# Patient Record
Sex: Female | Born: 1935
Health system: Southern US, Community
[De-identification: ages and names within clinical notes are randomized; demographics above are authoritative.]

## PROBLEM LIST (undated history)

## (undated) DIAGNOSIS — Z8673 Personal history of transient ischemic attack (TIA), and cerebral infarction without residual deficits: Secondary | ICD-10-CM

## (undated) DIAGNOSIS — J189 Pneumonia, unspecified organism: Secondary | ICD-10-CM

## (undated) DIAGNOSIS — F419 Anxiety disorder, unspecified: Secondary | ICD-10-CM

## (undated) DIAGNOSIS — J449 Chronic obstructive pulmonary disease, unspecified: Secondary | ICD-10-CM

## (undated) DIAGNOSIS — Z8701 Personal history of pneumonia (recurrent): Secondary | ICD-10-CM

## (undated) DIAGNOSIS — M1990 Unspecified osteoarthritis, unspecified site: Secondary | ICD-10-CM

## (undated) DIAGNOSIS — K219 Gastro-esophageal reflux disease without esophagitis: Secondary | ICD-10-CM

## (undated) DIAGNOSIS — S0990XA Unspecified injury of head, initial encounter: Secondary | ICD-10-CM

## (undated) DIAGNOSIS — N3941 Urge incontinence: Secondary | ICD-10-CM

## (undated) DIAGNOSIS — K5903 Drug induced constipation: Secondary | ICD-10-CM

## (undated) DIAGNOSIS — E039 Hypothyroidism, unspecified: Secondary | ICD-10-CM

## (undated) DIAGNOSIS — F32A Depression, unspecified: Secondary | ICD-10-CM

## (undated) DIAGNOSIS — I639 Cerebral infarction, unspecified: Secondary | ICD-10-CM

## (undated) DIAGNOSIS — F329 Major depressive disorder, single episode, unspecified: Secondary | ICD-10-CM

## (undated) DIAGNOSIS — I739 Peripheral vascular disease, unspecified: Secondary | ICD-10-CM

## (undated) DIAGNOSIS — K5790 Diverticulosis of intestine, part unspecified, without perforation or abscess without bleeding: Secondary | ICD-10-CM

## (undated) DIAGNOSIS — M797 Fibromyalgia: Secondary | ICD-10-CM

## (undated) DIAGNOSIS — I10 Essential (primary) hypertension: Secondary | ICD-10-CM

## (undated) DIAGNOSIS — K2970 Gastritis, unspecified, without bleeding: Secondary | ICD-10-CM

## (undated) DIAGNOSIS — R0600 Dyspnea, unspecified: Secondary | ICD-10-CM

## (undated) HISTORY — DX: Peripheral vascular disease, unspecified: I73.9

## (undated) HISTORY — DX: Personal history of transient ischemic attack (TIA), and cerebral infarction without residual deficits: Z86.73

## (undated) HISTORY — PX: BACK SURGERY: SHX140

## (undated) HISTORY — PX: CATARACT EXTRACTION W/ INTRAOCULAR LENS IMPLANT: SHX1309

## (undated) HISTORY — PX: TOTAL KNEE ARTHROPLASTY: SHX125

## (undated) HISTORY — PX: CHOLECYSTECTOMY: SHX55

## (undated) HISTORY — PX: FRACTURE SURGERY: SHX138

## (undated) HISTORY — PX: ROTATOR CUFF REPAIR: SHX139

## (undated) HISTORY — PX: EYE SURGERY: SHX253

## (undated) HISTORY — PX: JOINT REPLACEMENT: SHX530

## (undated) HISTORY — DX: Personal history of pneumonia (recurrent): Z87.01

---

## 1997-06-22 ENCOUNTER — Ambulatory Visit (HOSPITAL_COMMUNITY): Admission: RE | Admit: 1997-06-22 | Discharge: 1997-06-22 | Payer: Self-pay | Admitting: Neurosurgery

## 1997-07-06 ENCOUNTER — Ambulatory Visit (HOSPITAL_COMMUNITY): Admission: RE | Admit: 1997-07-06 | Discharge: 1997-07-06 | Payer: Self-pay | Admitting: Neurosurgery

## 1997-12-29 ENCOUNTER — Ambulatory Visit (HOSPITAL_COMMUNITY): Admission: RE | Admit: 1997-12-29 | Discharge: 1997-12-29 | Payer: Self-pay | Admitting: Obstetrics & Gynecology

## 1999-07-12 ENCOUNTER — Other Ambulatory Visit: Admission: RE | Admit: 1999-07-12 | Discharge: 1999-07-12 | Payer: Self-pay | Admitting: Gynecology

## 2000-05-29 ENCOUNTER — Ambulatory Visit (HOSPITAL_COMMUNITY): Admission: RE | Admit: 2000-05-29 | Discharge: 2000-05-29 | Payer: Self-pay | Admitting: Pulmonary Disease

## 2000-07-25 ENCOUNTER — Ambulatory Visit (HOSPITAL_COMMUNITY): Admission: RE | Admit: 2000-07-25 | Discharge: 2000-07-25 | Payer: Self-pay | Admitting: Obstetrics and Gynecology

## 2000-07-25 ENCOUNTER — Encounter: Payer: Self-pay | Admitting: Obstetrics and Gynecology

## 2000-09-04 ENCOUNTER — Other Ambulatory Visit: Admission: RE | Admit: 2000-09-04 | Discharge: 2000-09-04 | Payer: Self-pay | Admitting: Obstetrics and Gynecology

## 2000-10-16 ENCOUNTER — Encounter: Payer: Self-pay | Admitting: Orthopaedic Surgery

## 2000-10-16 ENCOUNTER — Ambulatory Visit (HOSPITAL_COMMUNITY): Admission: RE | Admit: 2000-10-16 | Discharge: 2000-10-16 | Payer: Self-pay | Admitting: Orthopaedic Surgery

## 2000-12-21 HISTORY — PX: INCONTINENCE SURGERY: SHX676

## 2000-12-21 HISTORY — PX: VAGINAL HYSTERECTOMY: SUR661

## 2000-12-21 HISTORY — PX: ANTERIOR AND POSTERIOR REPAIR: SHX1172

## 2001-01-04 ENCOUNTER — Inpatient Hospital Stay (HOSPITAL_COMMUNITY): Admission: RE | Admit: 2001-01-04 | Discharge: 2001-01-07 | Payer: Self-pay | Admitting: Obstetrics and Gynecology

## 2001-06-28 ENCOUNTER — Ambulatory Visit (HOSPITAL_COMMUNITY): Admission: RE | Admit: 2001-06-28 | Discharge: 2001-06-28 | Payer: Self-pay | Admitting: Orthopaedic Surgery

## 2001-06-28 ENCOUNTER — Encounter: Payer: Self-pay | Admitting: Orthopaedic Surgery

## 2001-08-06 ENCOUNTER — Ambulatory Visit (HOSPITAL_BASED_OUTPATIENT_CLINIC_OR_DEPARTMENT_OTHER): Admission: RE | Admit: 2001-08-06 | Discharge: 2001-08-06 | Payer: Self-pay | Admitting: Orthopaedic Surgery

## 2001-09-03 ENCOUNTER — Encounter (HOSPITAL_COMMUNITY): Admission: RE | Admit: 2001-09-03 | Discharge: 2001-10-03 | Payer: Self-pay | Admitting: Orthopaedic Surgery

## 2001-09-05 ENCOUNTER — Encounter: Payer: Self-pay | Admitting: Orthopaedic Surgery

## 2001-09-05 ENCOUNTER — Ambulatory Visit (HOSPITAL_COMMUNITY): Admission: RE | Admit: 2001-09-05 | Discharge: 2001-09-05 | Payer: Self-pay | Admitting: Orthopaedic Surgery

## 2001-10-03 ENCOUNTER — Encounter (HOSPITAL_COMMUNITY): Admission: RE | Admit: 2001-10-03 | Discharge: 2001-11-02 | Payer: Self-pay | Admitting: Orthopaedic Surgery

## 2001-11-05 ENCOUNTER — Encounter (HOSPITAL_COMMUNITY): Admission: RE | Admit: 2001-11-05 | Discharge: 2001-12-05 | Payer: Self-pay | Admitting: Orthopaedic Surgery

## 2001-11-19 ENCOUNTER — Ambulatory Visit (HOSPITAL_COMMUNITY): Admission: RE | Admit: 2001-11-19 | Discharge: 2001-11-19 | Payer: Self-pay | Admitting: Orthopaedic Surgery

## 2001-11-19 ENCOUNTER — Encounter: Payer: Self-pay | Admitting: Orthopaedic Surgery

## 2001-12-04 ENCOUNTER — Ambulatory Visit (HOSPITAL_COMMUNITY): Admission: RE | Admit: 2001-12-04 | Discharge: 2001-12-04 | Payer: Self-pay | Admitting: Pulmonary Disease

## 2002-10-21 ENCOUNTER — Ambulatory Visit (HOSPITAL_COMMUNITY): Admission: RE | Admit: 2002-10-21 | Discharge: 2002-10-21 | Payer: Self-pay | Admitting: Pulmonary Disease

## 2002-10-28 ENCOUNTER — Ambulatory Visit (HOSPITAL_COMMUNITY): Admission: RE | Admit: 2002-10-28 | Discharge: 2002-10-28 | Payer: Self-pay | Admitting: Pulmonary Disease

## 2002-12-08 ENCOUNTER — Encounter: Payer: Self-pay | Admitting: Obstetrics and Gynecology

## 2002-12-08 ENCOUNTER — Ambulatory Visit (HOSPITAL_COMMUNITY): Admission: RE | Admit: 2002-12-08 | Discharge: 2002-12-08 | Payer: Self-pay | Admitting: Obstetrics and Gynecology

## 2002-12-08 ENCOUNTER — Ambulatory Visit (HOSPITAL_COMMUNITY): Admission: RE | Admit: 2002-12-08 | Discharge: 2002-12-08 | Payer: Self-pay | Admitting: Orthopaedic Surgery

## 2002-12-08 ENCOUNTER — Encounter: Payer: Self-pay | Admitting: Orthopaedic Surgery

## 2003-07-02 ENCOUNTER — Inpatient Hospital Stay (HOSPITAL_COMMUNITY): Admission: RE | Admit: 2003-07-02 | Discharge: 2003-07-07 | Payer: Self-pay | Admitting: Orthopedic Surgery

## 2004-02-02 ENCOUNTER — Ambulatory Visit (HOSPITAL_COMMUNITY): Admission: RE | Admit: 2004-02-02 | Discharge: 2004-02-02 | Payer: Self-pay | Admitting: Pulmonary Disease

## 2004-05-31 ENCOUNTER — Encounter: Admission: RE | Admit: 2004-05-31 | Discharge: 2004-05-31 | Payer: Self-pay | Admitting: Orthopedic Surgery

## 2004-06-29 ENCOUNTER — Ambulatory Visit (HOSPITAL_COMMUNITY): Admission: RE | Admit: 2004-06-29 | Discharge: 2004-06-29 | Payer: Self-pay | Admitting: Orthopedic Surgery

## 2004-06-29 ENCOUNTER — Ambulatory Visit (HOSPITAL_BASED_OUTPATIENT_CLINIC_OR_DEPARTMENT_OTHER): Admission: RE | Admit: 2004-06-29 | Discharge: 2004-06-29 | Payer: Self-pay | Admitting: Orthopedic Surgery

## 2004-09-29 ENCOUNTER — Ambulatory Visit (HOSPITAL_COMMUNITY): Admission: RE | Admit: 2004-09-29 | Discharge: 2004-09-29 | Payer: Self-pay | Admitting: Pulmonary Disease

## 2005-02-06 ENCOUNTER — Ambulatory Visit (HOSPITAL_COMMUNITY): Admission: RE | Admit: 2005-02-06 | Discharge: 2005-02-06 | Payer: Self-pay | Admitting: Obstetrics and Gynecology

## 2005-02-20 DIAGNOSIS — I779 Disorder of arteries and arterioles, unspecified: Secondary | ICD-10-CM

## 2005-02-20 HISTORY — DX: Disorder of arteries and arterioles, unspecified: I77.9

## 2005-02-20 HISTORY — PX: CAROTID ENDARTERECTOMY: SUR193

## 2005-03-23 ENCOUNTER — Ambulatory Visit (HOSPITAL_COMMUNITY): Admission: RE | Admit: 2005-03-23 | Discharge: 2005-03-23 | Payer: Self-pay | Admitting: Pulmonary Disease

## 2005-08-15 ENCOUNTER — Ambulatory Visit: Payer: Self-pay | Admitting: Internal Medicine

## 2005-09-08 ENCOUNTER — Ambulatory Visit: Payer: Self-pay | Admitting: Internal Medicine

## 2005-09-08 ENCOUNTER — Ambulatory Visit (HOSPITAL_COMMUNITY): Admission: RE | Admit: 2005-09-08 | Discharge: 2005-09-08 | Payer: Self-pay | Admitting: Internal Medicine

## 2006-03-01 ENCOUNTER — Ambulatory Visit (HOSPITAL_COMMUNITY): Admission: RE | Admit: 2006-03-01 | Discharge: 2006-03-01 | Payer: Self-pay | Admitting: Obstetrics and Gynecology

## 2006-06-13 ENCOUNTER — Ambulatory Visit (HOSPITAL_COMMUNITY): Admission: RE | Admit: 2006-06-13 | Discharge: 2006-06-13 | Payer: Self-pay | Admitting: Obstetrics and Gynecology

## 2006-08-21 HISTORY — PX: LUMBAR FUSION: SHX111

## 2006-08-23 ENCOUNTER — Inpatient Hospital Stay (HOSPITAL_COMMUNITY): Admission: RE | Admit: 2006-08-23 | Discharge: 2006-08-24 | Payer: Self-pay | Admitting: Orthopedic Surgery

## 2006-10-09 ENCOUNTER — Encounter (HOSPITAL_COMMUNITY): Admission: RE | Admit: 2006-10-09 | Discharge: 2006-11-08 | Payer: Self-pay | Admitting: Orthopedic Surgery

## 2007-02-21 HISTORY — PX: BREAST SURGERY: SHX581

## 2007-03-06 ENCOUNTER — Ambulatory Visit (HOSPITAL_COMMUNITY): Admission: RE | Admit: 2007-03-06 | Discharge: 2007-03-06 | Payer: Self-pay | Admitting: Pulmonary Disease

## 2007-03-06 ENCOUNTER — Ambulatory Visit (HOSPITAL_COMMUNITY): Admission: RE | Admit: 2007-03-06 | Discharge: 2007-03-06 | Payer: Self-pay | Admitting: Obstetrics and Gynecology

## 2007-03-22 ENCOUNTER — Encounter (INDEPENDENT_AMBULATORY_CARE_PROVIDER_SITE_OTHER): Payer: Self-pay | Admitting: General Surgery

## 2007-03-22 ENCOUNTER — Ambulatory Visit (HOSPITAL_COMMUNITY): Admission: RE | Admit: 2007-03-22 | Discharge: 2007-03-22 | Payer: Self-pay | Admitting: General Surgery

## 2007-05-12 ENCOUNTER — Inpatient Hospital Stay (HOSPITAL_COMMUNITY): Admission: EM | Admit: 2007-05-12 | Discharge: 2007-05-14 | Payer: Self-pay | Admitting: Emergency Medicine

## 2007-09-10 ENCOUNTER — Encounter (INDEPENDENT_AMBULATORY_CARE_PROVIDER_SITE_OTHER): Payer: Self-pay | Admitting: Podiatry

## 2007-09-10 ENCOUNTER — Ambulatory Visit (HOSPITAL_COMMUNITY): Admission: RE | Admit: 2007-09-10 | Discharge: 2007-09-10 | Payer: Self-pay | Admitting: Podiatry

## 2007-09-10 HISTORY — PX: BUNIONECTOMY WITH HAMMERTOE RECONSTRUCTION: SHX5600

## 2008-03-13 ENCOUNTER — Ambulatory Visit (HOSPITAL_COMMUNITY): Admission: RE | Admit: 2008-03-13 | Discharge: 2008-03-13 | Payer: Self-pay | Admitting: Pulmonary Disease

## 2008-03-25 ENCOUNTER — Ambulatory Visit (HOSPITAL_COMMUNITY): Admission: RE | Admit: 2008-03-25 | Discharge: 2008-03-25 | Payer: Self-pay | Admitting: Obstetrics and Gynecology

## 2008-04-02 ENCOUNTER — Ambulatory Visit (HOSPITAL_COMMUNITY): Admission: RE | Admit: 2008-04-02 | Discharge: 2008-04-02 | Payer: Self-pay | Admitting: Pulmonary Disease

## 2008-07-03 ENCOUNTER — Ambulatory Visit (HOSPITAL_COMMUNITY): Admission: RE | Admit: 2008-07-03 | Discharge: 2008-07-03 | Payer: Self-pay | Admitting: Obstetrics and Gynecology

## 2008-11-09 ENCOUNTER — Ambulatory Visit (HOSPITAL_COMMUNITY): Admission: RE | Admit: 2008-11-09 | Discharge: 2008-11-09 | Payer: Self-pay | Admitting: Ophthalmology

## 2009-03-26 ENCOUNTER — Ambulatory Visit (HOSPITAL_COMMUNITY): Admission: RE | Admit: 2009-03-26 | Discharge: 2009-03-26 | Payer: Self-pay | Admitting: Obstetrics and Gynecology

## 2009-04-02 ENCOUNTER — Encounter: Admission: RE | Admit: 2009-04-02 | Discharge: 2009-04-02 | Payer: Self-pay | Admitting: Obstetrics and Gynecology

## 2009-08-13 ENCOUNTER — Ambulatory Visit (HOSPITAL_COMMUNITY): Admission: RE | Admit: 2009-08-13 | Discharge: 2009-08-13 | Payer: Self-pay | Admitting: Pulmonary Disease

## 2009-12-01 ENCOUNTER — Inpatient Hospital Stay (HOSPITAL_COMMUNITY): Admission: RE | Admit: 2009-12-01 | Discharge: 2009-12-03 | Payer: Self-pay | Admitting: Orthopedic Surgery

## 2010-01-04 ENCOUNTER — Encounter (HOSPITAL_COMMUNITY)
Admission: RE | Admit: 2010-01-04 | Discharge: 2010-02-03 | Payer: Self-pay | Source: Home / Self Care | Attending: Orthopedic Surgery | Admitting: Orthopedic Surgery

## 2010-03-01 ENCOUNTER — Ambulatory Visit (HOSPITAL_COMMUNITY): Admission: RE | Admit: 2010-03-01 | Payer: Self-pay | Source: Home / Self Care | Admitting: Orthopedic Surgery

## 2010-03-01 ENCOUNTER — Ambulatory Visit (HOSPITAL_COMMUNITY)
Admission: RE | Admit: 2010-03-01 | Discharge: 2010-03-01 | Payer: Self-pay | Source: Home / Self Care | Admitting: Orthopedic Surgery

## 2010-03-11 ENCOUNTER — Other Ambulatory Visit: Payer: Self-pay | Admitting: Obstetrics and Gynecology

## 2010-03-11 DIAGNOSIS — Z1239 Encounter for other screening for malignant neoplasm of breast: Secondary | ICD-10-CM

## 2010-03-13 ENCOUNTER — Encounter: Payer: Self-pay | Admitting: Obstetrics and Gynecology

## 2010-03-13 ENCOUNTER — Encounter: Payer: Self-pay | Admitting: Pulmonary Disease

## 2010-04-04 ENCOUNTER — Ambulatory Visit (HOSPITAL_COMMUNITY)
Admission: RE | Admit: 2010-04-04 | Discharge: 2010-04-04 | Disposition: A | Discharge status: Home / Self Care | Payer: Medicare HMO | Source: Ambulatory Visit | Attending: Obstetrics and Gynecology | Admitting: Obstetrics and Gynecology

## 2010-04-04 DIAGNOSIS — Z1231 Encounter for screening mammogram for malignant neoplasm of breast: Secondary | ICD-10-CM | POA: Insufficient documentation

## 2010-04-04 DIAGNOSIS — Z1239 Encounter for other screening for malignant neoplasm of breast: Secondary | ICD-10-CM

## 2010-05-04 LAB — BASIC METABOLIC PANEL
CO2: 29 mEq/L (ref 19–32)
CO2: 30 mEq/L (ref 19–32)
Chloride: 102 mEq/L (ref 96–112)
Chloride: 97 mEq/L (ref 96–112)
Creatinine, Ser: 0.57 mg/dL (ref 0.4–1.2)
GFR calc Af Amer: >60 mL/min (ref >60)
Glucose, Bld: 118 mg/dL — ABNORMAL HIGH (ref 70–99)
Potassium: 4.1 mEq/L (ref 3.5–5.1)
Sodium: 135 mEq/L (ref 135–145)
Sodium: 135 mEq/L (ref 135–145)

## 2010-05-04 LAB — URINE MICROSCOPIC-ADD ON

## 2010-05-04 LAB — COMPREHENSIVE METABOLIC PANEL
ALT: 16 U/L (ref 0–35)
Alkaline Phosphatase: 77 U/L (ref 39–117)
BUN: 10 mg/dL (ref 6–23)
CO2: 30 mEq/L (ref 19–32)
GFR calc non Af Amer: >60 mL/min (ref >60)
Glucose, Bld: 113 mg/dL — ABNORMAL HIGH (ref 70–99)
Potassium: 4.4 mEq/L (ref 3.5–5.1)
Sodium: 139 mEq/L (ref 135–145)

## 2010-05-04 LAB — CBC
HCT: 28.3 % — ABNORMAL LOW (ref 36.0–46.0)
HCT: 43.9 % (ref 36.0–46.0)
Hemoglobin: 10.1 g/dL — ABNORMAL LOW (ref 12.0–15.0)
Hemoglobin: 15.2 g/dL — ABNORMAL HIGH (ref 12.0–15.0)
Hemoglobin: 9.4 g/dL — ABNORMAL LOW (ref 12.0–15.0)
MCH: 30.6 pg (ref 26.0–34.0)
MCH: 31.1 pg (ref 26.0–34.0)
MCHC: 33.2 g/dL (ref 30.0–36.0)
MCHC: 34.6 g/dL (ref 30.0–36.0)
MCV: 92.8 fL (ref 78.0–100.0)
MCV: 93 fL (ref 78.0–100.0)
RBC: 3.3 MIL/uL — ABNORMAL LOW (ref 3.87–5.11)
WBC: 6.5 10*3/uL (ref 4.0–10.5)

## 2010-05-04 LAB — URINALYSIS, ROUTINE W REFLEX MICROSCOPIC
Bilirubin Urine: NEGATIVE
Bilirubin Urine: NEGATIVE
Glucose, UA: NEGATIVE mg/dL
Glucose, UA: NEGATIVE mg/dL
Hgb urine dipstick: NEGATIVE
Hgb urine dipstick: NEGATIVE
Protein, ur: NEGATIVE mg/dL
Specific Gravity, Urine: 1.016 (ref 1.005–1.030)
pH: 7.5 (ref 5.0–8.0)

## 2010-05-04 LAB — URINE CULTURE: Colony Count: >=100000

## 2010-05-04 LAB — TYPE AND SCREEN
ABO/RH(D): O POS
Antibody Screen: NEGATIVE

## 2010-05-04 LAB — PROTIME-INR: Prothrombin Time: 12.6 seconds (ref 11.6–15.2)

## 2010-05-24 ENCOUNTER — Encounter (HOSPITAL_BASED_OUTPATIENT_CLINIC_OR_DEPARTMENT_OTHER)
Admission: RE | Admit: 2010-05-24 | Discharge: 2010-05-24 | Disposition: A | Discharge status: Home / Self Care | Payer: Medicare HMO | Source: Ambulatory Visit | Attending: Orthopedic Surgery | Admitting: Orthopedic Surgery

## 2010-05-24 LAB — BASIC METABOLIC PANEL
BUN: 13 mg/dL (ref 6–23)
Chloride: 99 mEq/L (ref 96–112)
Glucose, Bld: 109 mg/dL — ABNORMAL HIGH (ref 70–99)
Potassium: 4.6 mEq/L (ref 3.5–5.1)

## 2010-05-26 ENCOUNTER — Ambulatory Visit (HOSPITAL_BASED_OUTPATIENT_CLINIC_OR_DEPARTMENT_OTHER)
Admission: RE | Admit: 2010-05-26 | Discharge: 2010-05-26 | Disposition: A | Discharge status: Home / Self Care | Payer: Medicare HMO | Source: Ambulatory Visit | Attending: Orthopedic Surgery | Admitting: Orthopedic Surgery

## 2010-05-26 DIAGNOSIS — M67919 Unspecified disorder of synovium and tendon, unspecified shoulder: Secondary | ICD-10-CM | POA: Insufficient documentation

## 2010-05-26 DIAGNOSIS — M66329 Spontaneous rupture of flexor tendons, unspecified upper arm: Secondary | ICD-10-CM | POA: Insufficient documentation

## 2010-05-26 DIAGNOSIS — M719 Bursopathy, unspecified: Secondary | ICD-10-CM | POA: Insufficient documentation

## 2010-05-26 DIAGNOSIS — M19019 Primary osteoarthritis, unspecified shoulder: Secondary | ICD-10-CM | POA: Insufficient documentation

## 2010-05-26 DIAGNOSIS — Z01812 Encounter for preprocedural laboratory examination: Secondary | ICD-10-CM | POA: Insufficient documentation

## 2010-05-26 DIAGNOSIS — M24119 Other articular cartilage disorders, unspecified shoulder: Secondary | ICD-10-CM | POA: Insufficient documentation

## 2010-05-26 LAB — POCT HEMOGLOBIN-HEMACUE: Hemoglobin: 14.6 g/dL (ref 12.0–15.0)

## 2010-05-27 LAB — HEMOGLOBIN AND HEMATOCRIT, BLOOD
HCT: 38 % (ref 36.0–46.0)
Hemoglobin: 13.2 g/dL (ref 12.0–15.0)

## 2010-05-27 LAB — BASIC METABOLIC PANEL
BUN: 14 mg/dL (ref 6–23)
Chloride: 105 mEq/L (ref 96–112)
Glucose, Bld: 145 mg/dL — ABNORMAL HIGH (ref 70–99)
Potassium: 4.4 mEq/L (ref 3.5–5.1)
Sodium: 140 mEq/L (ref 135–145)

## 2010-06-14 ENCOUNTER — Ambulatory Visit (HOSPITAL_COMMUNITY)
Admission: RE | Admit: 2010-06-14 | Discharge: 2010-06-14 | Disposition: A | Discharge status: Home / Self Care | Payer: Medicare HMO | Source: Ambulatory Visit | Attending: Specialist | Admitting: Specialist

## 2010-06-14 DIAGNOSIS — M6281 Muscle weakness (generalized): Secondary | ICD-10-CM | POA: Insufficient documentation

## 2010-06-14 DIAGNOSIS — M25569 Pain in unspecified knee: Secondary | ICD-10-CM | POA: Insufficient documentation

## 2010-06-14 DIAGNOSIS — IMO0001 Reserved for inherently not codable concepts without codable children: Secondary | ICD-10-CM | POA: Insufficient documentation

## 2010-06-14 DIAGNOSIS — M25659 Stiffness of unspecified hip, not elsewhere classified: Secondary | ICD-10-CM | POA: Insufficient documentation

## 2010-06-14 DIAGNOSIS — R262 Difficulty in walking, not elsewhere classified: Secondary | ICD-10-CM | POA: Insufficient documentation

## 2010-06-17 ENCOUNTER — Ambulatory Visit (HOSPITAL_COMMUNITY)
Admission: RE | Admit: 2010-06-17 | Discharge: 2010-06-17 | Disposition: A | Discharge status: Home / Self Care | Payer: Medicare HMO | Source: Ambulatory Visit | Attending: Pulmonary Disease | Admitting: Pulmonary Disease

## 2010-06-21 ENCOUNTER — Ambulatory Visit (HOSPITAL_COMMUNITY)
Admission: RE | Admit: 2010-06-21 | Discharge: 2010-06-21 | Disposition: A | Discharge status: Home / Self Care | Payer: Medicare HMO | Source: Ambulatory Visit | Attending: Pulmonary Disease | Admitting: Pulmonary Disease

## 2010-06-23 ENCOUNTER — Ambulatory Visit (HOSPITAL_COMMUNITY)
Admission: RE | Admit: 2010-06-23 | Discharge: 2010-06-23 | Disposition: A | Discharge status: Home / Self Care | Payer: Medicare HMO | Source: Ambulatory Visit | Attending: Pulmonary Disease | Admitting: Pulmonary Disease

## 2010-06-28 NOTE — Op Note (Signed)
  [name], Wendy             ACCOUNT NO.:  [id number]  MEDICAL RECORD NO.:  [id number]          PATIENT TYPE:  LOCATION:                                 FACILITY:  PHYSICIAN:  Loreta Ave, M.D. DATE OF BIRTH:  [birth year]  DATE OF PROCEDURE:  05/26/2010 DATE OF DISCHARGE:                              OPERATIVE REPORT   PREOPERATIVE DIAGNOSES:  Right shoulder recurrent chronic retracted probably irreparable rotator cuff tear.  Significant degenerative arthritis right shoulder.  POSTOPERATIVE DIAGNOSES:  Right shoulder recurrent chronic retracted probably irreparable rotator cuff tear.  Significant degenerative arthritis right shoulder with grade 4 changes throughout the shoulder. Irreparable retracted tear of supra and infraspinatus tendon.  Marked tearing intra-articular portion long head biceps tendon.  Extensive degenerative tearing of the labrum.  Adequate distal clavicle excision, but some recurrent subacromial spurring.  PROCEDURES:  Right shoulder exam under anesthesia, arthroscopy. Debridement of rotator cuff and labrum.  Released resection of biceps tendon.  Bursectomy and revision acromioplasty.  SURGEON:  Loreta Ave, MD  ASSISTANT:  Zonia Kief, PA present throughout the entire case and necessary for timely completion of procedure.  ANESTHESIA:  General.  ESTIMATED BLOOD LOSS:  Minimal.  SPECIMENS:  None.  CULTURES:  None.  COMPLICATIONS:  None.  DRESSINGS:  Soft compressive sling.  PROCEDURE:  The patient was brought to the operating room, placed on the operating table in a supine position.  After adequate anesthesia had been obtained, shoulder examined.  Good passive motion and stability. High-riding humeral head.  Placed in beach-chair position on the shoulder positioner, prepped and draped in usual sterile fashion.  Two portals, one posterior, one posterolateral.  Arthroscope introduced, shoulder distended and inspected.  Grade 4  changes throughout the entire glenohumeral joint.  Chondral debris, loose bodies removed.  Extensive circumferential tearing labrum debrided.  About 50% of biceps left, but marked tearing of the entire intra-articular portion.  This was released and resected.  After completely clearing out all the debris and the shoulder cuff was examined.  This was a chronic retracted tear medial to the glenoid.  Nothing reparable at all, not mobile at all.  Debrided back to a stable surface.  Looking above, she had already had acromioplasty, but there was some recurrent spurring in the middle and at the front.  Conversion to a nice flat acromion, preserving as much as possible because of the high-riding humeral head.  The CA ligament was not re-released and I did not have to remove any further clavicle.  At completion, the entire shoulder examined to be sure of everything being cleared out.  The cuff was looked up from all angles and repair was not an option at all.  Instruments and fluid removed.  Portals closed with nylon.  Sterile compressive dressing applied.  Sling applied. Anesthesia reversed.  Brought to the recovery room.  Tolerated surgery well.  No complications.     Loreta Ave, M.D.     DFM/MEDQ  D:  05/26/2010  T:  05/27/2010  Job:  [id number]  Electronically Signed by Mckinley Jewel M.D. on 06/28/2010 01:28:31 PM

## 2010-06-30 ENCOUNTER — Ambulatory Visit (HOSPITAL_COMMUNITY)
Admission: RE | Admit: 2010-06-30 | Discharge: 2010-06-30 | Disposition: A | Discharge status: Home / Self Care | Payer: Medicare HMO | Source: Ambulatory Visit | Attending: Pulmonary Disease | Admitting: Pulmonary Disease

## 2010-07-01 ENCOUNTER — Ambulatory Visit (HOSPITAL_COMMUNITY)
Admission: RE | Admit: 2010-07-01 | Discharge: 2010-07-01 | Disposition: A | Discharge status: Home / Self Care | Payer: Medicare HMO | Source: Ambulatory Visit | Attending: Pulmonary Disease | Admitting: Pulmonary Disease

## 2010-07-05 ENCOUNTER — Ambulatory Visit (HOSPITAL_COMMUNITY)
Admission: RE | Admit: 2010-07-05 | Discharge: 2010-07-05 | Disposition: A | Discharge status: Home / Self Care | Payer: Medicare HMO | Source: Ambulatory Visit | Attending: Pulmonary Disease | Admitting: Pulmonary Disease

## 2010-07-05 NOTE — H&P (Signed)
[name], [name]             ACCOUNT NO.:  [id number]   MEDICAL RECORD NO.:  [id number]          PATIENT TYPE:  INP   LOCATION:  A318                          FACILITY:  [facility]   PHYSICIAN:  Mila Homer. Sudie Bailey, M.D.DATE OF BIRTH:  October 13, 1935   DATE OF ADMISSION:  05/12/2007  DATE OF DISCHARGE:  LH                              HISTORY & PHYSICAL   ADDENDUM:  Last diagnosis is abnormal blood test.  Her MCV was low and  she has been on iron.  For this reason, I am checking a ferritin level  and iron level on her tomorrow and we will Hemoccult her stool.      Mila Homer. Sudie Bailey, M.D.  Electronically Signed     SDK/MEDQ  D:  05/12/2007  T:  05/12/2007  Job:  [id number]

## 2010-07-05 NOTE — Op Note (Signed)
[name], [name]             ACCOUNT NO.:  [id number]   MEDICAL RECORD NO.:  [id number]          PATIENT TYPE:  OIB   LOCATION:  1608                         FACILITY:  [facility]   PHYSICIAN:  Alvy Beal, MD    DATE OF BIRTH:  Sep 12, 1935   DATE OF PROCEDURE:  08/22/2006  DATE OF DISCHARGE:                               OPERATIVE REPORT   PREOPERATIVE DIAGNOSIS:  Severe lumbar spinal stenosis L2 to L5.   POSTOPERATIVE DIAGNOSIS:  Severe lumbar spinal stenosis L2 to L5.   OPERATION PERFORMED:  Lumbar spinal decompression L2 to L5 with  foraminotomy and in situ arthrodesis using regional autograft bone and  synthetic Actifuse bone graft extender.   SURGEON:  Alvy Beal, MD   ANESTHESIA:  General.   COMPLICATIONS:  None.   CONDITION:  Stable.   INDICATIONS FOR PROCEDURE:  [name] is a very pleasant 75 year old with  longstanding severe low back pain which has become acutely worse over  the last two to three years.  She also began having symptoms consistent  with neurogenic claudication.  After attempt at conservative management  consisting of physio and injection therapy, medical management, braces,  had failed to alleviate any of her symptoms.  She elected to proceed  with surgery.  All appropriate risks, benefits and alternatives were  explained to the patient and consent was obtained.   DESCRIPTION OF PROCEDURE:  The patient was brought to the operating  room, placed supine on the operating table.  After successful induction  of general anesthesia and endotracheal intubation, TEDs, SCD and a Foley  catheter were applied.  The patient was turned prone onto the Wilson  frame.  All bony prominences were well padded and the spine was prepped  and draped in standard fashion.   Incision was then made starting at the inferior aspect of the L5 and  proceeding superiorly to the superior aspect of L2.  Sharp dissection  was carried out down to the deep fascia.  The deep  fascia was sharply  incised and I then stripped the paraspinal muscles off the spinous  processes of what I believed to be L2, L3, L4 and L5.  This was done  bilaterally.  I then exposed out over the lateral aspect of the facet  joint to expose the L2, 3, 4 transverse processes.   With the posterolateral decompression completed, with the posterolateral  approach completed being able to expose the spine, I then placed a  Penfield 4 under what I believed to be the lamina of L2 and L4.  Intraoperative x-ray indicated that I was at L3 and L1 and so I  readjusted the incision.  I then repeated the x-ray and confirmed the L2  and L4 levels.  At this point I then used the double action Leksell  rongeur to resect the spinous processes of L2, L3 and L4 in their  entirety.   At this point I then proceeded with the decompression.  Using a Leksell  rongeur I performed a partial laminectomy of L2 and then used a fine  Carlens curette to develop a plane between  the ligamentum flavum and the  lamina.  Then using a combination of 2 and 3 mm Kerrison rongeurs, I  performed a generous laminectomy of L2.  With the central decompression  complete, I was able to develop a plane underneath the ligamentum  flavum.  I then carefully resected the ligamentum flavum at the L2 level  and directly exposed the dura.  There was significant compression of the  dura noted at this level.  I then took a great deal of time resecting  the thickened bulk of ligamentum flavum in order to complete the central  decompression at L2.  I then passed a small neural patty underneath the  superior aspect of the lamina of L3 in order to protect the dura.  I  then used a combination of 2 and 3 mm Kerrisons to do a complete  laminectomy of L3.  I then removed again the severely thickened  ligamentum flavum and then again did a central decompression at L4.  At  this point with the central decompression completed, I then went out the   lateral gutter.  I resected the ligamentum flavum and bone spurs out to  the medial wall of the pedicle.  I was able to visualize the L2, L3 and  L4 pedicles and had completely decompressed the lateral recess.  I then  used a 3 mm Kerrison to pass out the neural foramen at L2, L3 and L4 and  performed a generous foraminotomy.  At this point I was able to freely  pass a [facility] out the neural foramen and along the lateral  recess on the left side without any neural compression.   I then went to the opposite side and began a similar lateral recess and  decompression and foraminotomy.  There was a significantly large bone  spur at L3-4 with ligamentum flavum directly adherent to the underlying  dura.  I took a great deal of time using a Penfield 4 and microcurettes  in order to separate the adherent ligamentum flavum from the dura.  I  then resected this very large osteophyte and again visualized the medial  wall of the L2, 3, and 4 pedicles.  I then carried my lateral recess  decompression out to that medial wall.  I then did generous  foraminotomies at L2, L3 and L4.  I was again able to freely pass the  [facility] elevator on the lateral recess and out the neural foramen  without difficulty.  At this point with the thecal sac now expanded and  adequately decompressed as well as the nerve roots decompressed, I then  decorticated the transverse processes at L2, 3 and 4 and packed the  regional autograft bone that I had harvested mixed it with Actifuse  synthetic bone graft extender.  A deep drain was placed.  I obtained  hemostasis using bipolar electrocautery and Floseal.  Thrombin gel  soaked patty was then placed over the decompression site and I closed  the deep fascia with interrupted #1 Vicryl sutures, superficial with 2-0  Vicryl sutures and staples for the skin.  A dry dressing was applied.  The patient was then extubated and transferred to the PACU without  incident.  At the  end of the case all needle and sponge counts were  correct.      Alvy Beal, MD  Electronically Signed     DDB/MEDQ  D:  08/22/2006  T:  08/23/2006  Job:  [id number]

## 2010-07-05 NOTE — Group Therapy Note (Signed)
NAMEGRACY, EHLY             ACCOUNT NO.:  [id number]   MEDICAL RECORD NO.:  [id number]          PATIENT TYPE:  INP   LOCATION:  A318                          FACILITY:  [facility]   PHYSICIAN:  Edward L. Juanetta Gosling, M.D.DATE OF BIRTH:  [birth year]   DATE OF PROCEDURE:  05/14/2007  DATE OF DISCHARGE:                                 PROGRESS NOTE   Ms. Owensby says she feels better and wants to go home.  She has no new  complaints.  Her daughter-in-law is concerned about her work of  breathing and we have discussed that at length.  I have told Ms. Flett  that she could certainly stay another day but she is adamant that she  really wants to go home.   PHYSICAL EXAMINATION:  She looks pretty comfortable.  Temperature is  97.6, pulse 102, respirations 20, blood pressure 145/87, O2 sats 94% on  3 liters.  Her chest is clear.  She has somewhat decreased breath sounds  but no wheezing at all now.  Her heart is regular with tachycardia.  Her  iron level is somewhat low.   ASSESSMENT:  I think she is better from her chronic obstructive  pulmonary disease standpoint.   PLAN:  Discharge home.  Please see discharge summary for details.      Edward L. Juanetta Gosling, M.D.  Electronically Signed     ELH/MEDQ  D:  05/14/2007  T:  05/14/2007  Job:  [id number]

## 2010-07-05 NOTE — Discharge Summary (Signed)
[name], [name]             ACCOUNT NO.:  [id number]   MEDICAL RECORD NO.:  [id number]          PATIENT TYPE:  OIB   LOCATION:  1608                         FACILITY:  [facility]   PHYSICIAN:  Alvy Beal, MD    DATE OF BIRTH:  24-Aug-1935   DATE OF ADMISSION:  08/22/2006  DATE OF DISCHARGE:  08/24/2006                               DISCHARGE SUMMARY   ADMITTING DIAGNOSIS:  Lumbar spinal stenosis.   DISCHARGE DIAGNOSIS:  Lumbar spinal stenosis.   OPERATIVE PROCEDURE:  On August 22, 2006, was a lumbar spinal decompression  and uninstrumented arthrodesis, L2 to L5.   COMPLICATIONS:  None.   CONDITION:  Stable.   HISTORY:  This is a very pleasant 75 year old woman, who has had  longstanding low back pain.  Attempts at conservative management were  tried unsuccessfully.  Since her pain has become progressively worse and  we have been managing it nonoperatively is not successful, she elected  to proceed with surgery.  As I indicated, the patient has spinal  stenosis with neurogenic claudication.  As a result, she is taken to the  operating room on August 22, 2006, for a multilevel lumbar decompression  and arthrodesis.  The patient tolerated this procedure well.  There was  no significant adverse intraoperative occurrences.   Postoperative day 1, I evaluated her.  She was doing quite well.  Her  neurogenic claudication pain had resolved.  Her Foley was removed;  however, it was noted to be cloudy, and so a UA was ordered.  She had  many bacteria, and so she was started on Bactrim double strength for  further treatment of the UTI.  Her pain was well controlled with oral  medication, and she was tolerating a regular diet and voiding  spontaneously.  Prior to discharge, she will be seen and evaluated by  physiotherapy for home clearance, and she will be discharged in the  morning of August 24, 2006, to her family.  She will have a followup  appointment with me on September 04, 2006, at 9:30  a.m. to evaluate the  wound.  She knows to contact my office if she develops any significant  drainage, fevers, chills, weakness, or severe pain.  I expect her  postoperative course to be well.  I will encourage her to walk as much  as possible.  She knows she can sponge bathe, but she cannot shower  because there are staples in the wound.   DISCHARGE MEDICATIONS:  Crestor, diltiazem XR, levothyroxine, aspirin,  Nexium, vitamin C, calcium, Bactrim double strength for a total of 3  days, and Vicodin for pain control.   At this point in time, the patient was stable.     Alvy Beal, MD  Electronically Signed    DDB/MEDQ  D:  08/23/2006  T:  08/23/2006  Job:  [id number]

## 2010-07-05 NOTE — Discharge Summary (Signed)
[name], [name]             ACCOUNT NO.:  [id number]   MEDICAL RECORD NO.:  [id number]          PATIENT TYPE:  INP   LOCATION:  A318                          FACILITY:  [facility]   PHYSICIAN:  Edward L. Juanetta Gosling, M.D.DATE OF BIRTH:  Aug 27, 1935   DATE OF ADMISSION:  05/12/2007  DATE OF DISCHARGE:  LH                               DISCHARGE SUMMARY   FINAL DISCHARGE DIAGNOSES:  1. Chronic obstructive pulmonary disease exacerbation.  2. Hyperlipidemia.  3. Osteoarthritis.  4. Hypertension.  5. Hyperlipidemia.  6. Hypothyroidism.  7. Gastroesophageal reflux disease.  8. Spinal stenosis with neurogenic claudication.  9. Pelvic relaxation.  10.History of total knee replacement.  11.History of spinal decompression.  12.Status post hysterectomy.  13.Previous pelvic floor surgeries.  14.Anemia of chronic disease.   HISTORY:  [name] is a 75 year old who has a history of COPD and who  had been in her usual state of fair health at home when she went to see  a friend in a nursing home.  The friend had been febrile and then after  a day or two, [name] started having cough and congestion.  She came  to the emergency room where she was found to have what appeared to be a  COPD exacerbation.   PHYSICAL EXAMINATION:  Her exam showed that  GENERAL APPEARANCE:  She was in no acute distress, she was thin but well-  developed, she had some wheezing.  She was alert and oriented.  VITAL SIGNS:  Temperature was 97.9, blood pressure 160/70, pulse 101,  respirations 24, O2 sat was 92% on room air.  CHEST:  Decreased breath sounds and some wheezes.  HEART:  Regular with a rate of about 80.  EXTREMITIES:  She had no edema.   Hemoglobin level was 10.8.   HOSPITAL COURSE:  She was started on steroids, inhaled bronchodilators,  intravenous antibiotics, etc., and improved.  By the time of discharge,  she was much better, had no more wheezing.  She did not appear to be as  short of breath and  said that she felt like she was back pretty much to  baseline..  I am going to discharge her home.   DISCHARGE MEDICATIONS:  To continue  1. Crestor 5 mg daily.  2. Dolobid 5 mg daily as needed for pain.  3. Diltiazem extended release 120 mg daily.  4. Levofloxacin 88 mcg daily.  5. Aspirin 325 mg daily.  6. Nexium 20 mg daily.  7. Multivitamin daily.  8. Flaxseed oil daily.  9. Calcium, magnesium, vitamins A and D and folic acid, vitamin B, B12      and selenium all of which are over-the-counter that she is going to      continue.  10.Advil P.M. 2 tablets nightly.  11.She is also going to add some iron and see if it makes any      difference.  Her iron level was low but stool was negative for      blood.  12.She is going to be on prednisone 40 mg x 3 days, 30 x3 days, 20 x3  days, 10 x3 days and then stop.  13.She is going to be on Ceftin 500 mg b.i.d.  14.She is going to be on a nebulizer with albuterol and Atrovent at      home and use that about four times a day.  We are going to measure      an O2 sat today.  She may need oxygen short-term at home.   I offered home health services but her daughter who works in home health  lives next door and is out of work right now so she is going to be  checking on her mother-in-law.      Edward L. Juanetta Gosling, M.D.  Electronically Signed     ELH/MEDQ  D:  05/14/2007  T:  05/14/2007  Job:  [id number]

## 2010-07-05 NOTE — Op Note (Signed)
[name], [name]             ACCOUNT NO.:  [id number]   MEDICAL RECORD NO.:  [id number]          PATIENT TYPE:  AMB   LOCATION:  DAY                           FACILITY:  [facility]   PHYSICIAN:  Dalia Heading, M.D.  DATE OF BIRTH:  10-27-1935   DATE OF PROCEDURE:  03/22/2007  DATE OF DISCHARGE:                               OPERATIVE REPORT   PREOPERATIVE DIAGNOSIS:  Right breast neoplasm, unspecified.   POSTOPERATIVE DIAGNOSIS:  Right breast neoplasm, unspecified.   PROCEDURE:  Right breast biopsy.   SURGEON:  Dalia Heading, M.D.   ANESTHESIA:  MAC.   INDICATIONS:  The patient is a 75 year old of white female who presents  with a dominant mass in the upper, outer quadrant of the right breast.  Preoperative mammography is negative.  The risks and benefits of the  procedure including bleeding and infection were fully explained to the  patient, who gave informed consent.   PROCEDURE NOTE:  The patient was placed in the supine position.  After  anesthesia was administered, the right breast was prepped and draped  using the usual sterile technique with Betadine.  Surgical site  confirmation was performed.  Then 1% Xylocaine was used local  anesthesia.   A curvilinear incision was made in the upper, outer quadrant of the mass  which was localized by palpation preoperatively.  It was indistinct in  nature.  On dissection, it appeared to be lobulated breast tissue within  fibrotic tissue.  There was no gross evidence of malignancy.  The  specimen was then removed, and sent to the pathologist for further  examination.  Any bleeding was controlled using Bovie electrocautery.  The skin was reapproximated using a 4-0 Vicryl subcuticular suture.  Dermabond was then applied.   All tape and needle counts were correct at the end the procedure.  The  patient was awakened and transferred to PACU in stable condition.   COMPLICATIONS:  None.   SPECIMEN:  Right breast tissue.   BLOOD  LOSS:  Minimal.      Dalia Heading, M.D.  Electronically Signed     MAJ/MEDQ  D:  03/22/2007  T:  03/22/2007  Job:  [id number]   cc:   Tilda Burrow, M.D.  Fax: [phone]   Oneal Deputy. Juanetta Gosling, M.D.  Fax: [phone]

## 2010-07-05 NOTE — Op Note (Signed)
NAMEADIN, LARICCIA             ACCOUNT NO.:  [id number]   MEDICAL RECORD NO.:  [id number]          PATIENT TYPE:  AMB   LOCATION:  DAY                           FACILITY:  [facility]   PHYSICIAN:  Denny Peon. Ulice Brilliant, D.P.M.  DATE OF BIRTH:  [birth year]   DATE OF PROCEDURE:  09/10/2007  DATE OF DISCHARGE:  09/10/2007                               OPERATIVE REPORT   PREOPERATIVE DIAGNOSES:  1. Hallux abductovalgus deformity, left foot.  2. Hammer digit, second toe, left foot.   POSTOPERATIVE DIAGNOSES:  1. Hallux abductovalgus deformity, left foot.  2. Hammer digit, second toe, left foot.   PROCEDURES PERFORMED:  1. Keller bunionectomy, left foot.  2. Proximal interphalangeal joint arthroplasty with 0.045 K-wire      fixation, second digit, left foot.   SURGEON:  Denny Peon. Ulice Brilliant, D.P.M.   ANESTHESIA:  Monitored anesthesia care.   INDICATIONS FOR PROCEDURE:  Painful longstanding hammer digit and bunion  deformity of the left foot.  Hammer digit has recurrently gotten  inflamed, in fact one point looked to be infected.  Preoperatively, a  bone scan was read as negative.  She was treated empirically with local  care and subsided.   OPERATIVE FINDINGS:  The patient does have a slight subdermal abscess  over the proximal interphalangeal joint of the second digit of the left  foot at the time of surgery.  The abscess under the skin was cultured.  This does deem the second procedure as being a dirty/infected wound  classification.   DESCRIPTION OF PROCEDURE:  Ms. Spiegelman was brought in the OR and placed  on the table in the supine position.  IV sedation was established.  Prior to receiving sedation, she was given 1 g of Ancef IV  preoperatively.  Following the sedation, a Mayo block was performed  about the first MTP of her left foot.  Further local anesthesia was  administered about the second digit.  A pneumatic ankle tourniquet was  applied across her left ankle.  Her foot was then  prepped and draped in  the usual aseptic fashion.  An Ace bandage utilized to exsanguinate her  foot.  The tourniquet inflated to 250 mmHg.   PROCEDURE:  1. Keller bunionectomy, left foot:  Attention was directed first to      the first MTP this left foot.  A 7-cm slightly curvilinear skin      incision was made over the first MTP.  The incision was deepened      via sharp and blunt dissection through subcutaneous tissues with      care taken to identify, undermine, and retract the neurovascular      bundles about the dorsal and medial aspect of the first MTP.  A      dorsolinear capsulotomy was then performed.  The capsule and      periosteal tissues were reflected away from the base both medially      and laterally of the proximal phalanx as well as the dorsal and      medial aspect of the first metatarsal head.  With good exposure  achieved at both the base and the medial aspect of the first      metatarsal head, attention was directed first to the base of the      proximal phalanx.  A #62 blade on an oscillating saw was utilized      to completely score through and through the base of the proximal      phalanx.  This segment of bone was then removed utilizing sharp and      blunt dissection through these capsular and periosteal structures.      This was removed completely.  The head of the first metatarsal was      not appreciated and the medial eminence was then removed utilizing      the oscillating saw.  The newly formed ostial surface was then      rasped smooth.  Care was taken that there was no disruption of the      capsular tissues during this.  The capsular tissues were then purse-      strung between the base of the proximal phalanx and the head of the      first metatarsal with the toe held rectus utilizing 2-0 Vicryl.      Further capsular closure was then performed about the head of the      first metatarsal utilizing 3-0 Vicryl.  Subcutaneous tissues were      then  reapproximated and closed utilizing 4-0 Vicryl.  Skin was      closed with 4-0 Vicryl in a running subcuticular suture.  Steri-      Strips were then applied across the proximal aspect of this      incision.  2. Proximal interphalangeal joint arthroplasty, second digit, left      foot:  Attention was directed to the second toe.  A dorsolinear      skin incision was made from the mid shaft of the proximal phalanx      crossing over the proximal interphalangeal joint and then extending      back through the base of the proximal phalanx and curving in a      curvilinear fashion over the second MTP.  The incision was deepened      via sharp and blunt dissection.  Purulent drainage, a very small      amount was noted right at the proximal interphalangeal joint above      the extensor tendon.  This was promptly cultured and then flushed      aggressively and suctioned.  There did not appear to be any      penetration of this infectious process through the extensor tendon      and certainly not down into bony prominence.  Care was taken to get      good exposure of the head of the proximal phalanx, the extensor      hood apparatus, the long extensor tendon over the MTP, and the      second MTP itself.  The extensor tendon over the MTP was sharply      incised and undermined revealing the MTP.  A transverse capsulotomy      was then performed across the MTP and an #11 McGlamry elevator was      introduced and utilized to free up the flexor plate.  An extensor      hood release was then performed with paralleling cuts along the      extensor tendon from the head of the proximal  phalanx to the base.      The extensor tendon over the proximal interphalangeal joint was      then entered by a dorsal transverse incision.  The medial and      lateral collateral ligaments isolated and severed.  The extensor      tendons retracted proximally.  The head of the proximal phalanx was      delivered into the  surgical wound and was excised with the #62      blade and the oscillating saw.  A 0.045 K-wire was then introduced      and driven through the base of the intermediate phalanx through the      distal aspect of the toe.  The K-wire was then retrograded back      across the distal shaft of the proximal phalanx and then with the      toe held rectus driven across the MTP.  The toe was noted to be      rectus in all three body planes at this point.  The wound was      flushed with copious amounts of sterile irrigant, redundant      extensor tendon was excised.  Subcutaneous tissues were      reapproximated and closed overlying the MTP.  The skin was then      closed from the distal aspect of the proximal aspect with      combination of simple interrupted and horizontal mattress sutures      of 4-0 Prolene.  The K-wire exiting the tip of the toe was then cut      and a pin cap was applied to it.  A postoperative injection of      Marcaine and Hexadrol was dispensed.  Betadine-soaked Adaptic      dressing was applied across both incisions and a relatively fluffy,      abundant slightly loose gauze dressing was then secured to the foot      and then secured with Coban dressing followed by 2-inch tape.  The      tourniquet was deflated, removed, 3-inch stockinette was applied      over the foot.  A Cam walker was dispensed postoperatively as well      as the Darco surgical shoe.   Ms. Kafer tolerated the anesthesia and procedure well.  She was  transported to [facility].  While there, she seems lucent and we have  had a conversation postoperatively about her instructions, which include  elevation, ice, essentially bedrest for the first 3 days.  A  prescription for cephalexin 500 q.8 h. was dispensed, which I wanted to  take until we get the results back of the culture.  Secondly, a  prescription for meperidine 50 mg due to a codeine and morphine  intolerance and promethazine 25 mg.  The  meperidine and promethazine  should be taken together about once every 4 hours for pain.  She will be  seen in 7 days for first postop visit.           ______________________________  Denny Peon. Ulice Brilliant, D.P.M.     CMD/MEDQ  D:  09/10/2007  T:  09/11/2007  Job:  [id number]

## 2010-07-05 NOTE — H&P (Signed)
NAMELAKEN, ROG             ACCOUNT NO.:  [id number]   MEDICAL RECORD NO.:  [id number]          PATIENT TYPE:  INP   LOCATION:  A318                          FACILITY:  [facility]   PHYSICIAN:  Mila Homer. Sudie Bailey, M.D.DATE OF BIRTH:  07-Feb-1936   DATE OF ADMISSION:  05/12/2007  DATE OF DISCHARGE:  LH                              HISTORY & PHYSICAL   This 75 year old presented to [facility] ER today  short of breath and with some wheezing.  She had been fine earlier in  the week, then had been exposed a friend of hers in a nursing home who  was febrile.   She also note she gets spring allergies yearly and ends up with at least  nasal congestion every spring.   Other medical problems include acid reflux, osteoarthrosis, generalized,  emphysema, hypercholesterolemia, hypothyroidism.  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  She has no history of alcohol or  drug abuse.   CURRENT MEDICATIONS:  1. Crestor 5 mg daily.  2. Dolobid 5 mg daily as needed for pain.  3. Diltiazem ER 120 mg daily.  4. Levothyroxine 88 mcg daily.  5. ASA 325 mg daily.  6. Nexium 20 mg daily.  7. Vitamins.  8. Flaxseed oral daily.  9. Calcium.  10.Magnesium.  11 . Vitamin A and D.  12 . Folic acid daily.  1. Vitamin B, B12, and selenium daily.  14 . Advil PM 2 tablets nightly.   PAST MEDICAL HISTORY:  She has long and complicated medical history.  This includes:  1. Hypothyroidism.  2. Hypertension.  3. Spinal stenosis with neurogenic claudication.  4. Advanced DJD of the right knee.  5. Pelvic relaxation.  6. Tobacco use disorder.  7. Dr. Eulah Pont gave her a right total knee replacement.  This about 3-4      years ago.  8. Dr. Shon Baton did lumbar spinal decompression at L3-L5 this last      summer.  9. Dr. Emelda Fear a hysterectomy.  10.Other surgeries to address her pelvic floor  relaxation with      cystocele, rectocele and stress urinary continence.   PHYSICAL EXAMINATION:  GENERAL:  Admission exam showed a pleasant 51-  year-old woman who is supine in bed.  She is in no acute distress. She  was well developed and thin.  She wheezy type voice and was on oxygen.  VITAL SIGNS:  Temperature 97.9, blood pressure  160/70, pulse 101,  respiratory rate 24, O2 saturation was 92% on room air.  NEUROLOGIC:  At the time of my exam, she appeared to be oriented and  alert.  LUNGS:  Show decreased breath sounds throughout, but there are no  intercostal retractions or use of accessory muscles for respiration.  HEART:  Regular rhythm, rate of 80, but heart sounds were faint.  ABDOMEN:  Soft without organomegaly or mass.  EXTREMITIES:  There was no edema at the ankles.  Bilateral bunions were  noted as well DJD of joints of  the hands and the feet.   Her white cell count was 6700 with 62% neutrophils, 26 lymphs,  hemoglobin 10.8, hematocrit 32.7, MCV of only 69.  Admission sodium was  133, glucose 121.   Chest x-ray as read by the ER physician was consistent with COPD.   ADMISSION DIAGNOSES:  1. Chronic obstructive pulmonary disease exacerbation.  2. Allergic rhinitis.  3. History of tobacco use.  4. Generalized osteoarthrosis with severe degenerative joint disease      of the right knee, now status post total right knee.  5. Hypercholesterolemia.  6. Benign essential hypertension.  7. Hypothyroidism.  8. Gastroesophageal reflux disease.   She is going to be admitted to Dr. Juanetta Gosling, given O2 at 2 liters,  albuterol and Atrovent nebulizer treatments q.4 h while awake, and pulse  oximetry to maintain her O2 saturation greater than 90%.  She will be on  Solu-Medrol 125 mg IV q.8 h, guaifenesin 1200 mg p.o. q.12 h and Tylenol  650 p.o. q.4 h p.r.n.  I will continue her diltiazem at 120 mg daily,  levofloxacin 88 mcg daily and EC ASA 325 mg daily, but give her Protonix  40  mg daily.  She also received Lovenox 30 mg subcutaneously daily as  prophylaxis. Rocephin 1 gram IV q.24 h, zolpidem 5 mg nightly for sleep.      Mila Homer. Sudie Bailey, M.D.  Electronically Signed     SDK/MEDQ  D:  05/12/2007  T:  05/12/2007  Job:  [id number]

## 2010-07-05 NOTE — H&P (Signed)
[name], [name]             ACCOUNT NO.:  [id number]   MEDICAL RECORD NO.:  [id number]          PATIENT TYPE:  AMB   LOCATION:  DAY                           FACILITY:  [facility]   PHYSICIAN:  Denny Peon. Ulice Brilliant, D.P.M.  DATE OF BIRTH:  1935/12/07   DATE OF ADMISSION:  DATE OF DISCHARGE:  LH                              HISTORY & PHYSICAL   HISTORY OF PRESENT ILLNESS:  The patient has a painful bunion and a  hammer digit of the second toe of the left foot, which has gotten  increasingly more uncomfortable and painful over the last several  months.  She originally presented in May with a wound in the dorsal  aspect of the second digit secondary to a pair of shoes that rubbed it  and made it sore.  Subsequent bone scan analysis was negative for  osteomyelitis.   PAST MEDICAL HISTORY:  Significant for hypertension,  hypercholesterolemia, hypothyroidism, history of reflux symptoms,  history of osteoporosis.   Her medicines at this point include Actonel, 81-mg aspirin, calcium,  Crestor, diltiazem, Dolobid, levothyroxine, Prilosec.  She takes B12,  vitamin C and vitamin D.   She is allergic to CODEINE, MORPHINE, CELEBREX, CIPRO and VIOXX.   PAST SURGICAL HISTORY:  Previous right knee replacement, previous  rotator cuff surgery, cholecystectomy, previous cataract left eye,  hysterectomy, previous back surgery, and carotid artery surgery.   OBJECTIVE:  A 75 year old white female with well-defined HAV deformity  of left foot and pronounced second digit hammer toe.  This second digit  is noted to be contracted in extension at the MTP and in flexion at the  PIPJ.  The great toe is abutting the second digit and has a pronounced  medial eminence.  She is noted to have palpable dorsalis pedis and  posterior tibial pulses.  She has previously had an ulceration or  abrasion of the skin with defect overlying the proximal interphalangeal  joint.   Radiographs reveal reduced bone stock with  pronounced HAV and hammer  digit.  As stated, previous bone scan was negative for osteomyelitis.   ASSESSMENT AND PLAN:  Hallux valgus and hammer digit, second toe.  We  are going to proceed with surgical correction.  This will consist of a  Keller bunionectomy along with a second digit arthroplasty with  temporary 0.045 K-wire fixation.  I have described the procedure to Ms.  Harmes.  We have had her in for a separate operative  consent-signing, and we have had a discussion.  We discussed the  procedure, the usual postoperative course and the possibility of  complications.  To my understanding, she has understood our discussion.  She has read the consent form and signed.                                            ______________________________  Denny Peon. Ulice Brilliant, D.P.M.     CMD/MEDQ  D:  09/09/2007  T:  09/09/2007  Job:  [id number]

## 2010-07-05 NOTE — H&P (Signed)
[name], [name]             ACCOUNT NO.:  [id number]   MEDICAL RECORD NO.:  [id number]          PATIENT TYPE:  AMB   LOCATION:  DAY                           FACILITY:  [facility]   PHYSICIAN:  Dalia Heading, M.D.  DATE OF BIRTH:  1935/08/28   DATE OF ADMISSION:  DATE OF DISCHARGE:  LH                              HISTORY & PHYSICAL   CHIEF COMPLAINT:  Right breast mass.   HISTORY OF PRESENT ILLNESS:  The patient is a 75 year old white female,  who was referred for evaluation and treatment of a right breast mass.  It has been tender to touch and seems to be increasing in size.  There  is no family history of breast carcinoma or nipple discharge.   PAST MEDICAL HISTORY:  Past medical history includes hypothyroidism and  hypertension.   PAST SURGICAL HISTORY:  Knee replacement, rotator cuff repair, back  surgery, cholecystectomy, hysterectomy, cataract surgery, right carotid  endarterectomy.   CURRENT MEDICATIONS:  1. Crestor.  2. Dilaudid.  3. Diltiazem.  4. Levothyroxine.  5. Omeprazole.  6. Baby aspirin.  7. Calcium.  8. Vitamin supplements.   ALLERGIES:  NO KNOWN DRUG ALLERGIES.   SOCIAL HISTORY:  The patient smokes one-half pack of cigarettes a day.  She denies any alcohol use.   REVIEW OF SYSTEMS:  She denies any other cardiopulmonary difficulties or  bleeding disorders.   PHYSICAL EXAMINATION:  GENERAL:  The patient is a well-developed, well-  nourished white female, in no acute distress.  NECK:  The neck is supple, without lymphadenopathy.  LUNGS:  The lungs are clear to auscultation, with equal breath sounds  bilaterally.  HEART:  Examination reveals a regular rate and rhythm, without S3, S4,  or murmurs.  BREASTS:  Right breast examination reveals a dominant, indistinct mass  noted in the upper, outer quadrant of the breast.  It is 2 cm in size.  No nipple discharge or dimpling is noted.  The axilla is negative for  palpable nodes.  The left breast  examination reveals no dominant mass,  nipple discharge, or dimpling.  The axilla is negative for palpable  nodes.  Mammography and ultrasound of the region is negative for  malignancy.   IMPRESSION:  Right breast neoplasm, unspecified.   PLAN:  The patient is scheduled for right breast biopsy on March 22, 2007.  The risks and benefits of the procedure, including bleeding,  infection, and the possibility of malignancy were fully explained to the  patient, and she gave an informed consent.      Dalia Heading, M.D.  Electronically Signed     MAJ/MEDQ  D:  03/14/2007  T:  03/14/2007  Job:  [id number]   cc:   Della Goo, M.D.  Fax: [phone]   Short Stay, [facility]   Tilda Burrow, M.D.  Fax: [phone]   Oneal Deputy. Juanetta Gosling, M.D.  Fax: [phone]

## 2010-07-05 NOTE — Group Therapy Note (Signed)
[name], [name]             ACCOUNT NO.:  [id number]   MEDICAL RECORD NO.:  [id number]          PATIENT TYPE:  INP   LOCATION:  A318                          FACILITY:  [facility]   PHYSICIAN:  Edward L. Juanetta Gosling, M.D.DATE OF BIRTH:  1935-10-17   DATE OF PROCEDURE:  05/13/2007  DATE OF DISCHARGE:                                 PROGRESS NOTE   Ms. Hetz was admitted yesterday with a COPD exacerbation.  She seems  to be doing better, but she is clearly not cleared up.  She is still  dyspneic at rest.  Her heart rate is in the 116-120 range.  Her chest  shows bilateral rhonchi but no wheezing.  Her temperature is 99, pulse  106 right now on the vital signs, respirations 20, blood pressure  126/96.  O2 saturation is 98% on 2.5 liters and ,she is currently on  nebulizer treatments, Rocephin and Mucinex and steroids.   ASSESSMENT:  She is better but still significantly short of breath, so I  am going to continue with all of her other treatments and follow.      Edward L. Juanetta Gosling, M.D.  Electronically Signed     ELH/MEDQ  D:  05/13/2007  T:  05/13/2007  Job:  [id number]

## 2010-07-05 NOTE — Procedures (Signed)
[name], [name]             ACCOUNT NO.:  [id number]   MEDICAL RECORD NO.:  [id number]          PATIENT TYPE:  OUT   LOCATION:  RESP                          FACILITY:  [facility]   PHYSICIAN:  Edward L. Juanetta Gosling, M.D.DATE OF BIRTH:  [birth year]   DATE OF PROCEDURE:  DATE OF DISCHARGE:                            PULMONARY FUNCTION TEST   PULMONARY FUNCTION TEST   1. Spirometry shows no ventilatory defect, but does show evidence of      airflow obstruction, which is most marked in the smaller airways.  2. Lung volumes show some hyperinflation with air trapping.  3. DLCO is mildly reduced.  4. There is no significant bronchodilator improvement.      Edward L. Juanetta Gosling, M.D.  Electronically Signed     ELH/MEDQ  D:  03/23/2008  T:  03/23/2008  Job:  [id number]

## 2010-07-07 ENCOUNTER — Ambulatory Visit (HOSPITAL_COMMUNITY)
Admission: RE | Admit: 2010-07-07 | Discharge: 2010-07-07 | Disposition: A | Discharge status: Home / Self Care | Payer: Medicare HMO | Source: Ambulatory Visit | Attending: Pulmonary Disease | Admitting: Pulmonary Disease

## 2010-07-08 ENCOUNTER — Other Ambulatory Visit (HOSPITAL_COMMUNITY): Payer: Self-pay | Admitting: Pulmonary Disease

## 2010-07-08 NOTE — Op Note (Signed)
[facility]  Patient:    [name], [name] Visit Number: [id number] MRN: [id number]          Service Type: GYN Location: 4A A419 01 Attending Physician:  Tilda Burrow Dictated by:   Christin Bach, M.D. Admit Date:  01/04/2001 Discharge Date: 01/07/2001                             Operative Report  PREOPERATIVE DIAGNOSES:  Pelvic relaxation, cystocele, rectocele, stress incontinence.  POSTOPERATIVE DIAGNOSES:  Pelvic relaxation, cystocele, rectocele, stress incontinence.  PROCEDURE: 1. Vaginal hysterectomy, anterior and posterior repair -- Christin Bach, M.D. 2. Tension-free vaginal taping -- Dennie Maizes, M.D.  SURGEONS: Christin Bach, M.D. Dennie Maizes, M.D.  ANESTHESIA:  DESCRIPTION OF PROCEDURE:  Patient was taken to the operating room and prepped and draped in the usual fashion for combined abdominal and vaginal procedure. The patient first had GYN procedure initiated with posterior colpotomy incision as well as anterior incision made in the vaginal epithelium.  The uterosacral ligaments were identified on each slide, clamped, cut and suture-ligated using curved Z-clamps, Mayo scissors transection and 0 chromic suture ligature which was tagged for future identified.  The anterior vesicouterine reflection of pelvic peritoneum was identified and entered anteriorly.  Lower cardinal ligaments could be identified and clamped, cut and suture-ligated on either side using Z-clamps, Mayo scissors and 0 chromic suture; the upper cardinal ligaments were treated similarly.  The broad ligament was taken down in serial bites on each side using Z-clamps, Mayo scissors and 0 chromic suture ligature.  Upon reaching the level of the uteroovarian ligaments and fallopian tube complex, each adnexal structure could be cross-clamped, the uterus removed and the pelvis doubly ligated. Consideration was given to removal of the ovaries but the ovaries  appeared grossly normal and pelvic visualization was not considered optimal for safe removal vaginally so we chose to leave the ovaries.  The uterosacral ligaments were pulled into the midline using a 2-0 silk suture, pulling them together, and then the pursestring suture placed around the pelvic opening to re-epithelialize the pelvic floor; this was successful.  Cuff itself was closed in the midline using a series or interrupted 0 chromic sutures and then the hysterectomy considered complete.  Anterior repair:  The anterior repair was then initiated by splitting the vaginal epithelium from the hysterectomy cuff inferiorly beneath the relaxed anterior vaginal mucosa.  This was taken down to the level of the ureterovesical junction.  Lateral dissection was performed underneath the bladder to allow access to supportive tissues on each side.  The procedure was then turned over to Dr. Rito Ehrlich for placement of his tension-free vaginal tape, as described elsewhere by Dr. Rito Ehrlich and notable for an incidental cystotomy by the tension-free tape which was recognized promptly and dealt with.  Upon completion of Dr. Teressa Lower placement of his tension-free tape, we completed the anterior repair by closure of the anterior vaginal epithelium with a series of mattress sutures of 0 Dexon on pop-offs.  Once these were pulled together, redundant vaginal epithelium could be trimmed and the anterior vaginal epithelium edges reapproximated using interrupted 2-0 chromic.  Posterior repair:  Posterior repair was then performed by splitting the vaginal epithelium on the posterior surfaces beginning at the posterior perineal body and proceeding upwards along the back vaginal wall, grasping the edges of the epithelium with T clamps, which allowed easy dissection laterally.  Pararectal tissues were pulled together in the midline with  a series of interrupted sutures of 0 Dexon using pop-off sutures.  The  vaginal epithelium was trimmed and then the posterior vaginal mucosa reapproximated with interrupted chromic sutures of 2-0 chromic.  Patient tolerated procedure well with dramatic improvement in pelvic support and with an estimated blood loss of 300 cc.  Dr. Rito Ehrlich then completed trimming of the tension-free vaginal tape as described elsewhere. Dictated by:   Christin Bach, M.D. Attending Physician:  Tilda Burrow DD:  01/24/01 TD:  01/24/01 Job: [id number]

## 2010-07-08 NOTE — Op Note (Signed)
[name], [name]             ACCOUNT NO.:  [id number]   MEDICAL RECORD NO.:  [id number]          PATIENT TYPE:  AMB   LOCATION:  DAY                           FACILITY:  [facility]   PHYSICIAN:  Lionel December, M.D.    DATE OF BIRTH:  05-May-1935   DATE OF PROCEDURE:  09/08/2005  DATE OF DISCHARGE:                                 OPERATIVE REPORT   PROCEDURE:  Colonoscopy.   ENDOSCOPIST:  Lionel December, M.D.   INDICATIONS FOR PROCEDURE:  [name] is a 75 year old Caucasian  female who is undergoing a screening colonoscopy.  Her family history is  negative for colorectal carcinoma.  The procedure risks were reviewed with  the patient and an informed consent was obtained.  She was seen in the  office a few weeks ago and begun on Prilosec.  She said her epigastric pain  is completely resolved.   MEDICATIONS:  For conscious sedation Demerol 50 mg IV, Versed 6 mg IV.   FINDINGS:  The procedure was performed in the endoscopy suite.  The  patient's vital signs and O2 saturation were monitored during the procedure  and remained stable.  The patient was placed in the left lateral decubitus  position and a rectal examination was performed.  No abnormality was noted  on external or digital exam.  The Olympus videoscope was placed in the  rectum and advanced under vision through a tortuous sigmoid colon with  scattered diverticula into the descending and splenic flexure.  There were a  few more diverticula in the descending colon.  Once past the splenic  flexure, the scope was easily advanced to the cecum which was identified by  the appendiceal orifice and ileocecal valve.  Pictures were taken for the  record.  The ileocecal valve was palpated with biopsy forceps and soft.  As  the scope was withdrawn, the colonic mucosa was once again carefully  examined.  No polyps and no tumor masses were noted.  The rectal mucosa was  normal.  The scope was retroflexed and examined in the  retroflexion, which  is unremarkable.  The endoscope was slowly withdrawn.   The patient tolerated the procedure well.   FINAL DIAGNOSIS:  Scattered diverticula of the sigmoid and descending colon,  otherwise normal colonoscopy.   RECOMMENDATIONS:  1.  She will resume her usual diet.  2.  A high fiber diet.  3.  She can continue Omeprazole or Prilosec, either on a p.r.n. basis or      daily, as long as she is on NSAID therapy.      Lionel December, M.D.  Electronically Signed     NR/MEDQ  D:  09/08/2005  T:  09/08/2005  Job:  [id number]   cc:   Ramon Dredge L. Juanetta Gosling, M.D.  Fax: [phone]

## 2010-07-08 NOTE — Op Note (Signed)
[place]  Patient:    [name], [name] Visit Number: [id number] MRN: [id number]          Service Type: DSU Location: [facility] Attending Physician:  Marcene Corning Dictated by:   Lubertha Basque. Jerl Santos, M.D. Proc. Date: 08/06/01 Admit Date:  08/06/2001                             Operative Report  PREOPERATIVE DIAGNOSIS: 1. Right shoulder impingement. 2. Right shoulder recurrent rotator cuff tear.  POSTOPERATIVE DIAGNOSIS: 1. Right shoulder impingement. 2. Right shoulder recurrent rotator cuff tear.  OPERATION PERFORMED: 1. Right shoulder arthroscopic acromioplasty. 2. Right shoulder arthroscopic rotator cuff repair.  ANESTHESIA:  General and block.  ATTENDING SURGEON:  Lubertha Basque. Jerl Santos, M.D.  ASSISTANT:  Lindwood Qua, P.A.  INDICATIONS FOR PROCEDURE:  The patient is a 75 year old woman about 10 years out from a rotator cuff repair done open.  She has had recurrent trouble for many months after a dog related accident.  She has undergone a preoperative MRI scan which shows a recurrent large rotator cuff tear.  She is offered repeat repair at this point as she has pain with sleep and pain with trying to use her arm.  The procedure was discussed with the patient and informed operative consent was obtained after discussion of possible complications of reaction to anesthesia and infection.  DESCRIPTION OF PROCEDURE:  The patient was taken to an operating suite where general anesthesia was applied without difficulty.  She was also given a block in the preanesthesia area.  She was then positioned in a beach chair position and prepped and draped in normal sterile fashion.  After administration of preop intravenous antibiotic, an arthroscopy of the right shoulder was performed through a total of three portals. The glenohumeral joint showed no degenerative change and the biceps tendon appeared benign.  The rotator cuff was torn from below.   This was also true from above and it extended about 2 cm along the insertion point and it was minimally retracted.  She had a prominent subacromial spur which was addressed with acromioplasty back to a flat surface.  I then used a bur to create a bleeding bed of bone under her area of rotator cuff tear.  It should be noted that she had a slight flap lesion which was addressed with a burring of this area as well.  I placed two of the 6.5 mm suture anchors as she had fairly osteoporotic bone.  The two sutures from each were passed through the rotator cuff and then tied in simple suture fashion reapproximating cuff to the bleeding bed of bone on the greater tuberosity.  Bryna Colander assisted throughout.  The shoulder was thoroughly irrigated at the end of the case followed by reapproximation of the portals with nylon.  Adaptic was placed over the wounds followed by dry gauze and tape.  Estimated blood loss and intraoperative fluids can be obtained from Anesthesia records.  DISPOSITION:  The patient was extubated in the operating room and taken to the recovery room in stable condition.  Plans were for her to go home the same day and to follow up in the office in less than a week.  I will contact her by phone tonight. Dictated by:   Lubertha Basque Jerl Santos, M.D. Attending Physician:  Marcene Corning DD:  08/06/01 TD:  08/07/01 Job: [id number]

## 2010-07-08 NOTE — Op Note (Signed)
NAMEANJELIKA, AUSBURN             ACCOUNT NO.:  [id number]   MEDICAL RECORD NO.:  [id number]          PATIENT TYPE:  AMB   LOCATION:  DSC                          FACILITY:  [facility]   PHYSICIAN:  Loreta Ave, M.D. DATE OF BIRTH:  Aug 23, 1935   DATE OF PROCEDURE:  06/29/2004  DATE OF DISCHARGE:                                 OPERATIVE REPORT   PREOPERATIVE DIAGNOSIS:  Recurrent large retracted tear, rotator cuff, right  shoulder.  Recurrent impingement with distal clavicle osteolysis.   POSTOPERATIVE DIAGNOSIS:  Recurrent large retracted tear, rotator cuff,  right shoulder.  Recurrent impingement with distal clavicle osteolysis.  Attritional labral tearing as well as grade 2 and 3 degenerative  chondromalacia, glenohumeral joint, focal grade 4 anterior glenoid.   OPERATION PERFORMED:  Right shoulder examination under anesthesia,  arthroscopy, debridement of glenohumeral joint and labrum. Debridement of  rotator cuff.  Revision acromioplasty with coracoacromial ligament released.  Excision of distal clavicle.  Miniopen repair rotator cuff tear with  FiberWire suture with bony tunnels and Concept repair system.  Also repair  interval tear.   SURGEON:  Loreta Ave, M.D.   ASSISTANT:  Genene Churn. Denton Meek.   ANESTHESIA:  General.   ESTIMATED BLOOD LOSS:  Minimal.   SPECIMENS:  None.   CULTURES:  None.   COMPLICATIONS:  None.   DRESSING:  Soft compressive with shoulder immobilizer.   DESCRIPTION OF PROCEDURE:  The patient was brought to the operating room  placed on operating table in supine position.  After adequate anesthesia had  been obtained, the right shoulder was examined.  Not much in the way of  adhesions, full motion, stable shoulder.  Placed in a beach chair position  on the shoulder positioner, prepped and draped in the usual sterile fashion.  Three portals, anterior, posterior and lateral.  Shoulder entered with blunt  obturator, distended and inspected.   Complete avulsion supraspinatus with  marked intratendinous tearing, delaminating into two layers.  Evidence of  the previous repair with some sutures at the very margin.  Retracted almost  to the glenoid.  Interval tear between the supraspinatus and subscap tendon.  Mobile and able to be brought at least half way over with reasonable tissue  quality.  Debridement of the labrum and glenohumeral joint.  Cuff debrided.  Cannula redirected subacromially.  Some recurrent spurring anterior acromion  treated with revision acromioplasty to a type 1 acromion with shaver and  high speed bur re-releasing the coracoacromial ligament.  Distal clavicle  grade 4 changes with spurs.  Periarticular spurs and lateral centimeter of  clavicle removed.  Adequacy of decompression confirmed viewing from all  portals.  Instruments and fluid removed.  Deltoid splitting incision  laterally through the lateral portal.  Subacromial space accessed.  Cuff was  thoroughly mobilized to allow repair.  Very osteoporotic bone. Once I was  able to mobilize the cuff out, I used the FiberWire suture in a weaved  manner to repair the interval tear which brought the cuff almost all the way  over the lateral border to allow for a fairly good repair without too  much  tension.  Cuff was captured with sutures that were weaved to close the  delamination and capture the cuff well medial and then bring it over  laterally.  Three sutures were brought out laterally.  A series of drill  holes was made in the humerus in the tuberosity.  Sutures weaved through the  drill holes.  The anterior and posterior ones were also brought over the  top.  Once this was complete, all sutures were firmly tied to one another  yielding a nice firm watertight closure over a bony bridge when the sutures  were tied together.  Despite the degree of retraction and degree of tearing,  I was very pleased with the watertight firm closure without undue tension   even will full passive motion.  Adequacy of decompression confirmed visually  at time of cuff repair.  Wound irrigated.  Deltoid closed with Vicryl.  Skin  closed with subcutaneous and subcuticular Vicryl and portals closed with  nylon.  Margins of wound injected with Marcaine.  Sterile compressive  dressing applied.  Shoulder immobilizer applied.  Anesthesia reversed.  Brought to recovery room.  Tolerated surgery well without complication.      DFM/MEDQ  D:  06/30/2004  T:  06/30/2004  Job:  [id number]

## 2010-07-08 NOTE — Op Note (Signed)
NAME:  EUREKA, VALDES                       ACCOUNT NO.:  [id number]   MEDICAL RECORD NO.:  [id number]                   PATIENT TYPE:  INP   LOCATION:  2899                                 FACILITY:  [facility]   PHYSICIAN:  Loreta Ave, M.D.              DATE OF BIRTH:  03/05/1935   DATE OF PROCEDURE:  07/02/2003  DATE OF DISCHARGE:                                 OPERATIVE REPORT   PREOPERATIVE DIAGNOSIS:  End-stage degenerative arthritis right knee with  marked valgus alignment and bony deficiency, lateral compartment both tibia  and femur.   POSTOPERATIVE DIAGNOSIS:  End-stage degenerative arthritis right knee with  marked valgus alignment and bony deficiency, lateral compartment both tibia  and femur.   PROCEDURE:  Right total knee replacement with soft tissue balancing.  Cemented #7 femoral component posterior stabilized.  Cemented #7 tibial  component with a 10-mm, posterior, stabilized, flex polyethylene insert.  Cemented, recessed, 26 mm patellar component; all [employer] prostheses.   SURGEON:  Loreta Ave, M.D.   ASSISTANT:  Arlys John D. Petrarca, P.A.-C.   ANESTHESIA:  Spinal.   BLOOD LOSS:  Minimal.   TOURNIQUET TIME:  1 hour and 20 minutes.   SPECIMENS:  Excised, bone and soft tissue.   CULTURES:  None.   COMPLICATIONS:  None.   DRESSING:  Soft, compressive knee immobilizer.   DRAINS:  Hemovac x2.   DESCRIPTION OF PROCEDURE:  The patient brought to the operating room and  after adequate anesthesia had been obtained tourniquet applied to the upper  aspect of the right leg.  Prepped and draped in the usual sterile fashion.  Exam with increased valgus of more than 10 degrees, varus was about 7  degrees.  Grossly stable ligament is almost full extension, flexion 110  degrees.  I exsanguinated with elevation of esmarch, knee flexed, and  tourniquet inflated to 350 mmHg.   Straight incision above the patella down to the tibial tubercle.  Medial  parapatellar arthrotomy.  Freeing up of interarticular adhesions.  Remnants  of menisci cruciate ligaments, excessive fat all removed.  Distal femur  exposed.  Intramedullary guide placed.  Distal cut removing 12 mm which I  had to go to because of the deficiency of the lateral condyle. This was done  in a 5 degree valgus cut.  Jigs were then put into place for the component  after being incised for #7 component.  A jig was rotated for normal external  rotation to make up for the defect posterolaterally.  Definitive cuts made.  Trial component placed and it fit well.   Attention turned to the tibia.  Tibial spine removed with a saw.  Intramedullary guide placed.  A 5-degree posterior slope cut resecting down  just below the defect laterally which gave good bone throughout and was  incised to #7 component.  Patella incised, reamed and drilled for a 26-mm  component.  Trial fit and placed; #7  on the femur and #7 on the tibia, and  26 on the patella.  With the 10-mm flex insertion full extension and full  flexion and after freeing up of adhesions good stability and good alignment,  set at 5 degrees of valgus, very acceptable femoral tracking.  The tibia was  marked for rotation and reamed.  All trials were removed.   Copious irrigation with the pulsed irrigating device.  Cement prepared and  placed on all components which were then firmly seated in place.  All excess  cement removed.  Polyethylene attached to the tibia, knee reduced.  Once the  cement had hardened the knee was reexamined.  Full extension and full  flexion nicely balanced the knee set at 5 degrees of valgus with good  stability in flexion/extension and good patella-femoral tracking.  Hemovac  was placed and brought out through a separate stab wound. Arthrotomy closed  with #1 Vicryl.  Skin and subcutaneous tissue with Vicryl and staples.  The  margins of the wound were injected with Marcaine as needed.  Sterile  compressive  dressing applied.  Drains had been placed prior to closure.  The  knee was injected with Marcaine and Hemovacs were clamped.  After the  dressing was in place the tourniquet was deflated and removed.  Knee  immobilizer applied.  Anesthesia reversed.  Brought to the recovery room.  Tolerated surgery well, no complications.                                               Loreta Ave, M.D.    DFM/MEDQ  D:  07/02/2003  T:  07/03/2003  Job:  [id number]

## 2010-07-08 NOTE — H&P (Signed)
[facility]  Patient:    [name], [name] Visit Number: [id number] MRN: [id number]          Service Type: Attending:  Christin Bach, M.D. Dictated by:   Christin Bach, M.D. Adm. Date:  01/03/01                           History and Physical  DATE OF BIRTH:  14-May-1935  ADMISSION DIAGNOSES: 1. Pelvic relaxation (cystocele, rectocele, first-degree uterine descensus). 2. Stress urinary incontinence.  HISTORY OF PRESENT ILLNESS:  This 75 year old female is admitted at this time for correction of pelvic relaxation which includes a rectocele, first-degree uterine descensus, and cystocele, with symptomatic stress urinary incontinence.  The patient has been seen in our office this year and complains of both symptoms of rectocele as well as a sense of pelvic heaviness which is related to the large cystocele, uterine descensus, and rectocele that she has. The patient has been on hormone replacement therapy and, after a discussion of options and pros and cons of hormone replacement therapy, she chooses to continue.  The recent findings of increased risks of breast cancer and heart disease have been reviewed extensively with the patient.  She will continue her Prempro until after the surgery and be converted to estrogen only thereafter.  Recent evaluation by Dr. Rito Ehrlich on December 25, 2000, supports the need for bladder tacking as well.  PAST MEDICAL HISTORY: 1. Hypertension. 2. Osteoarthritis. 3. History of neurologic disorder.  PAST SURGICAL HISTORY: 1. Bakers cyst excised from the knee in 1993 and 1994. 2. Cholecystectomy in 1992. 3. Rotator cuff surgery in 1991 and 1999. 4. Hand reconstruction in 1996. 5. Cataract surgery 2001.  ALLERGIES:  MORPHINE, VIOXX, and CELEBREX.  MEDICATIONS:  Dolobid, Dilacor, Prempro, Synthroid, aspirin recently discontinued, and glucosamine chondroitin sulfate.  HABITS:  The patient smokes approximately one pack  per day.  Alcohol and recreational drugs denied.  PHYSICAL EXAMINATION:  VITAL SIGNS:  Height 5 feet 3 inches, weight 150 pounds.  Blood pressure 135/70, pulse 70.  GENERAL:  Healthy Caucasian female who appears her stated age.  HEENT:  Pupils are equal, round, and reactive.  Extraocular movements intact.  NECK:  Supple.  CHEST:  Clear to auscultation.  Some inspiratory rhonchi consistent with smoking history.  CARDIOVASCULAR:  Unremarkable.  ABDOMEN:  Relaxed.  Well-healed surgical scars.  PELVIC:  External genitalia normal.  On vaginal exam she has a large cystocele, first-degree uterine descensus.  Adnexa without masses.  There is a rectocele clinically significant.  PLAN:  The patient is admitted for vaginal hysterectomy, possible removal of ovaries depending on the ease of surgical access, anterior and posterior repair.  Dr. Rito Ehrlich will perform ______ vaginal taping as well. Dictated by:   Christin Bach, M.D. Attending:  Christin Bach, M.D. DD:  01/03/01 TD:  01/03/01 Job: [id number]

## 2010-07-08 NOTE — Discharge Summary (Signed)
[facility]  Patient:    [name], [name] Visit Number: [id number] MRN: [id number]          Service Type: GYN Location: 4A A419 01 Attending Physician:  Tilda Burrow Dictated by:   Christin Bach, M.D. Admit Date:  01/04/2001 Discharge Date: 01/07/2001   CC:         Kari Baars, M.D.  Dennie Maizes, M.D.   Discharge Summary  ADMITTING DIAGNOSIS:  Pelvic relaxation with cystocele, rectocele and stress urinary incontinence.  DISCHARGE DIAGNOSES: 1. Pelvic relaxation with cystocele, rectocele and stress urinary    incontinence. 2. Hypertension. 3. Osteoarthritis. 4. Irritable bowel syndrome  PROCEDURES: 1. Vaginal hysterectomy with anterior and posterior repair by Dr. Emelda Fear. 2. Tension free vaginal taping, by Dr. Dennie Maizes.  DISCHARGE MEDICATIONS: 1. Dolobid 400 mg p.o. p.r.n. arthritis. 2. Dilacor XR 240 mg one p.o. q.d. 3. Synthroid 0.1 mcg p.o. q.d. 4. Baby aspirin 81 mg p.o. q.d. 5. Vitamins with mineral supplements. 6. Lortab one to two tablets p.o. q.4h. p.r.n. pain dispense #40. 7. Nicoderm 21 mg patch with one-month supply, refill x 2. 8. Ditropan XL 15 tablets one p.o. q.d. 9. Cipro 500 mg b.i.d. x 7 days.  FOLLOWUP:  Follow up in two weeks in my office and then six weeks in my office.  HISTORY OF PRESENT ILLNESS:  This is a 75 year old female with pelvic relaxation who is admitted for vaginal hysterectomy, A&P repair and tension free vaginal taping as described in Dr. Teressa Lower and my admitting history. The patient had symptomatic leakage for several years with urinary frequency x 6, nocturia x 2.  She has difficulty with defecation as well as stress incontinence.  PAST MEDICAL HISTORY: 1. Arthritis. 2. IBS.  PAST SURGICAL HISTORY: 1. Shoulder surgery x 2. 2. Rotator cuff repair in 1991 and 1999. 3. Bakers cyst excised from knee in 1993 and 1994. 4. Cholecystectomy in 1992. 5. Hand reconstruction  surgery in 1996. 6. Cataract surgery in 2001. 7. Cataract extraction and lens implantation, date unknown.  HOSPITAL COURSE:  The patient was admitted and underwent vaginal hysterectomy with A&P repair and tension free vaginal taping as described in the operative notes.  The procedure was technically challenge as the patient had incompletely evacuated stool which made for difficulty maintaining a sterile surgical field.  We ultimately felt like the surgical outcome was good.  An additional complication was an incidental bladder cystotomy which occurred during the tension free vaginal taping mesh placement which was recognized as being in the bladder and corrected during the initial placement procedure.  Postoperatively, the patient did well.  She had remained with no temperature greater than 99.7.  She was nauseated for the first day and had excellent p.o. intake on the second day and was discharged on postop day #3.  Additional laboratory data included a very erratic EKG with normal sinus rhythm with suspected right atrial enlargement, nonspecific ST and T wave depression. Chest x-ray was negative.  CONDITION ON DISCHARGE:  The patient was stable for discharge on postop day #3, tolerating a regular diet with the aforementioned medications and to be followed up in two weeks at our office or earlier p.r.n. constipation, fever or other complications. Dictated by:   Christin Bach, M.D. Attending Physician:  Tilda Burrow DD:  01/07/01 TD:  01/07/01 Job: [id number]

## 2010-07-08 NOTE — Op Note (Signed)
[facility]  Patient:    [name], [name] Visit Number: [id number] MRN: [id number]          Service Type: GYN Location: 4A A419 01 Attending Physician:  Tilda Burrow Dictated by:   Dennie Maizes, M.D. Proc. Date: 01/04/01 Admit Date:  01/04/2001   CC:         Christin Bach, M.D.   Operative Report  PREOPERATIVE DIAGNOSIS:  Stress urinary incontinence.  POSTOPERATIVE DIAGNOSIS:  Stress urinary incontinence.  OPERATIVE PROCEDURE:  Tension-free transvaginal tape procedure.  ANESTHESIA:  Spinal.  SURGEON:  Dennie Maizes, M.D.  ASSISTANT:  Christin Bach, M.D.  COMPLICATIONS:  None.  DRAINS:  20-French Foley catheter in the bladder.  INDICATION FOR THE PROCEDURE:  This 75 year old female had significant stress urinary incontinence.  She also had pelvic relaxation with uterine prolapse, cystocele and rectocele.  She was scheduled to undergo vaginal hysterectomy, anterior and posterior repair by Dr. Emelda Fear.  I had planned to do tension-free transvaginal tape procedure at the same time.  DESCRIPTION OF PROCEDURE:  Spinal anesthesia was induced and the patient was placed on the OR table in the dorsal lithotomy position.  The lower abdomen and genitalia were prepped and draped in a sterile fashion.  Dr. Emelda Fear first proceeded with vaginal hysterectomy.  He then mobilized the bladder. About 2 cc of Xylocaine with dilute epinephrine were infiltrated over the urethra and periurethral areas.  A small mid-urethral incision was made. Vaginal mucosal flaps were raised on both sides.  The pubic tubercles were marked on the skin.  The trocar was then inserted on the right side with digital guidance behind the pubic ramus to exit through the suprapubic area at a point about 1.4 cm lateral and above the pubic tubercle.  The position of the trocar was checked with cystoscopy.  There was a small area of entry into the bladder.  The trocar was  readjusted and inserted into a new location. Cystoscopy was repeated and the trocar was found to be in a satisfactory position.  The tape was then taken out of the suprapubic skin incision.  A similar procedure was done on the left side and the other end of the tape was pulled through the skin incision.  The bladder was then filled with about 300 cc of water.  The patient was asked to cough and the tension of the tape was adjusted.  The plastic sheets covering the tape were then removed.  The redundant tape was then excised close to the subcutaneous tissue.  The suprapubic incisions were closed using 4-0 Vicryl subcuticular sutures.  The urethral incision was then closed with 2-0 Vicryl sutures.  The 20-French Foley catheter was reinserted.  Dr. Emelda Fear then proceeded with completion of the anterior repair as well as posterior repair. Dictated by:   Dennie Maizes, M.D. Attending Physician:  Tilda Burrow DD:  01/04/01 TD:  01/04/01 Job: [id number]

## 2010-07-08 NOTE — H&P (Signed)
NAMECHERRYL, [name]             ACCOUNT NO.:  [id number]   MEDICAL RECORD NO.:  [id number]           PATIENT TYPE:   LOCATION:  RDC                           FACILITY:  [facility]   PHYSICIAN:  Lionel December, M.D.    DATE OF BIRTH:  07-Aug-1935   DATE OF ADMISSION:  DATE OF DISCHARGE:  LH                                HISTORY & PHYSICAL   PRESENTING COMPLAINT:  Epigastric pain.   HISTORY OF PRESENT ILLNESS:  [name] is a 75 year old Caucasian female  patient of Dr. Juanetta Gosling who presents with 52-month history of epigastric pain  which is intermittent experienced when she is fasting or in between meals or  also at night.  She began to take Tagamet OTC which has helped, but the pain  has not gone away completely.  It may last for several minutes.  It is not  associated with nausea, vomiting, melena or rectal bleeding.  She states  nothing tastes good.  She does not have a good appetite.  However, she has  not lost any weight.  Her bowels are regular but this has been a pattern.  She has a history of IBS.  She states her heartburn is well-controlled.  She  reports infrequent heartburn.   REVIEW OF SYSTEMS:  Review of systems is negative for fever, chills, night  sweats, cough, sore throat or hoarseness.   Review of the systems is positive for the diffuse arthralgias.  She states  she is not able to make it without Dolobid.   MEDICATIONS:  1.  She is on Dolobid 5 mg b.i.d.  2.  Dilacor 120 mg every day..  3.  Levothroid 88 mcg every day.  4.  Calcium 1.5 grams every day.  5.  Vitamin C 1 g b.i.d.  6.  B complex every day.  7.  Vitamin A and D every day.  8.  Tylenol Extra Strength two at bedtime.  9.  Rolaids p.r.n.  10. Plavix 75 mg every day.  11. Crestor 5 mg every day.  12. Fish oil 1 gram b.i.d.  13. Folic acid 400 mcg every day.  14. Sudafed 30 mg every day. p.r.n.  15. Metoprolol 25 mg b.i.d.  16. Tagamet OTC b.i.d..   PAST MEDICAL HISTORY:  1.  She has a history of  IBS.  2.  History of GERD.  She had an EGD back in September 1999, when she      presented with epigastric pain, nausea and heartburn.  She had mild      changes of reflux esophagitis, limited GE junction, small sliding hiatal      hernia and erosive gastritis.  Her CLO-test was negative.  At that time      she had screening sigmoidoscopy revealing a few diverticula.  3.  She has been hypothyroid for about 30 years.  4.  She has been hypertensive for about 10 years.  5.  She has been on therapy for hypercholesterolemia about 6 months.  She      has had repair of a right rotator cuff tear x3 initially in 1991 and  most recently in 2005.  She has had left rotator cuff repair x2.  6.  She had cholecystectomy in 1992.  7.  She has had right knee arthroscopy 1993 and 1994.  8.  She had knee replacement about two years ago.  9.  She had decompression of right carpal tunnel.  10. She had hysterectomy in 2002.  11. Most recent surgery was a right carotid and atherectomy on Jul 05, 2005.      Prior to that she either had a TIA or mini CVA.   ALLERGIES:  To multiple medications which include VIOXX, CELEBREX, PARAFON  FORTE, LEVAQUIN, CODEINE and MORPHINE.  Marland Kitchen   FAMILY HISTORY:  Mother of pancreatic carcinoma at age 37.  Father died of  metastatic prostate carcinoma at age 64.  One brother died at age 10.   SOCIAL HISTORY:  She is a widow.  She has three sons.  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  She  smoked cigarettes about a pack a day for 50 years but finally quit six  months ago.  She drinks alcohol occasionally.   PHYSICAL EXAMINATION:  GENERAL:  Pleasant well-developed, well-nourished  Caucasian female who is in no acute distress.  VITAL SIGNS:  She weighs 158 pounds.  She is 5 feet 3 inches tall.  Pulse 78  per minute, blood pressure 122/62, temperature is 98.3.  HEENT:  Conjunctivae is pink.  Sclerae is nonicteric.  Oral  pharyngeal  mucosa is normal.  She has partial upper plate and a few teeth in the lower  jaw.  NECK:  No neck masses are noted.  SKIN:  Right arthrectomy scar is well-healed.  HEART:  Cardiac exam reveals a regular rhythm with a Normal S1, S2.  No  murmur or gallop noted.  LUNGS:  Clear to auscultation.  ABDOMEN:  Her abdomen is symmetrical.  Bowel sounds are hyperactive.  On  palpation is soft with mild tenderness at LLQ and moderate tenderness at  midepigastrium.  No organomegaly or masses.  RECTAL:  Stool is guaiac negative.  EXTREMITIES:  No clubbing or edema noted.   ASSESSMENT:  1.  [name] is a 75 year old Caucasian female who presents with a two-month      history of epigastric pain which seemed to have improve with the use of      OTC Tagamet.  Her risk factors for peptic ulcer disease include chronic      NSAID therapy as well as Plavix.  She had an EGD in 1999 which revealed      erosive gastritis.  CLO-test was negative.  I suspect she has peptic      ulcer and other diagnosis would be less likely.  If she does not respond      to therapy, she will need further evaluation starting with EGD.  2.  She is average risk for colorectal carcinoma.  She had sigmoidoscopy in      September 1999.  She never has had a colonoscopy which she would be well      advised to undergo.   RECOMMENDATIONS:  1.  Discontinue Tagamet.  2.  Start Prilosec 20 mg q.a.m.  Samples given.  3.  Prescription for omeprazole 20 mg q.a.m., #30 with 11 refills.  4.  Will schedule her for colonoscopy for screening purposes.  Unless      epigastric pain responds to therapy, she will also undergo diagnostic  EGD.  I have reviewed the procedure risks with the patient.  She is      agreeable.  5.  The patient was also advised to take fiber supplement 3-4 grams per day.      Samples given.      Lionel December, M.D.  Electronically Signed     NR/MEDQ  D:  08/15/2005  T:  08/15/2005  Job:   [id number]  cc:   Ramon Dredge L. Juanetta Gosling, M.D.  Fax: [phone]

## 2010-07-08 NOTE — Discharge Summary (Signed)
NAME:  [name], [name]                       ACCOUNT NO.:  [id number]   MEDICAL RECORD NO.:  [id number]                   PATIENT TYPE:  INP   LOCATION:  5038                                 FACILITY:  [facility]   PHYSICIAN:  Loreta Ave, M.D.              DATE OF BIRTH:  02-Nov-1935   DATE OF ADMISSION:  07/02/2003  DATE OF DISCHARGE:  07/07/2003                                 DISCHARGE SUMMARY   ADMISSION DIAGNOSIS:  Advanced degenerative joint disease of the right knee.   DISCHARGE DIAGNOSIS:  1. Advanced degenerative joint disease of the right knee.  2. Hyperosmolality.  3. Hypertension.  4. Hypothyroidism.   PROCEDURE:  Right total knee replacement.   HISTORY:  A 75 year old female with a four to five year history of chronic  right knee pain which has worsened over the past year.  This is now  affecting her activities of daily living as well as having pain at night.  Last year, she had four injections with only minimal help.  She has failed  conservative treatment.  She is now indicated for a right total knee  replacement.   HOSPITAL COURSE:  A 75 year old female admitted on Jul 02, 2003, after  appropriate laboratory studies were obtained as well as 1 g of Ancef IV on  call to the operating room, was taken to the operating room where she  underwent a right total knee replacement.  She tolerated the procedure well.  Continued on Ancef 1 g IV q.8h. x3 doses.  Placed on a Dilaudid PCA pump.  Also was given [facility] one to two tabs q.4h. p.r.n. pain.  Routine  medications were given.  Heparin 5000 units subcutaneously q.12h. was  started until the Coumadin became therapeutic.  Consultation with physical  therapy and occupational therapy and rehab were made.  Ambulate  weightbearing as tolerated.  Foley was placed intraoperatively.  The  following day, she was allowed out of bed to a chair.  IV's were KVO.  Robaxin 500 mg p.o. q.6-8h. p.r.n. spasms was given.  We  fluid restricted  her on Jul 04, 2003, 1000 cc per 24 hours.  Calcium 500 mg b.i.d. was given.  CBG's q.6h. was started and hemoglobin A1C was ordered.  IV was discontinued  on Jul 04, 2003.  Fluid restrictions were continued on Jul 05, 2003.  Her  dressing was changed and her Hemovac's were pulled revealing a clean wound.  She did have some hypokalemia on Jul 06, 2003, and she was given 20 mEq p.o.  t.i.d.  Her potassium corrected and on Jul 07, 2003, her fluid restrictions  were discontinued and she was discharged to return back to the office in 10  to 14 days.  EKG revealed normal sinus rhythm.  Cannot rule out anterior  infarct, age undetermined.   LABORATORY DATA:  Radiographic studies:  On Jul 10, 2003, right knee reveals  anatomic alignment status  post right total knee arthroplasty.  Laboratory  studies were notable for a hemoglobin of 14.6, hematocrit 43, white count  7400, platelets 288,000.  Discharge hemoglobin 10.2, hematocrit 29.8, white  count 6100, and platelets were 308,000.  Discharge prothrombin time was 17.4  with an INR of 1.7.  Preoperative chemistries:  Sodium 140, potassium 4.8,  chloride 105, CO2 27, glucose 95, BUN 12, creatinine 0.7, calcium 10, total  protein 7.1, albumin 4.1, AST 25, ALT 17, ALP 78, and total bilirubin was  0.9.  Discharge sodium 134, potassium 4.4, chloride 98, CO2 26, glucose 122,  BUN 9, creatinine 0.6, calcium 8.6, total protein 6.3, albumin 2.7.  Glycosylated hemoglobin was 5.9.  Urinalysis preoperatively shows a small  amount of leukocyte esterase with a few epithelial's and 3 to 6 white cells,  0 to 2 red's, and rare bacteria.  Repeat of Jul 06, 2003, was benign.  Blood  type is O positive, antibody screen negative.   DISCHARGE MEDICATIONS:  1. She is given a prescription for Percocet 5/325 mg one to two tabs q.4h.     p.r.n. pain.  2. Coumadin 5 mg as directed by [facility].  She will start with 1/2     tablet on the day of  discharge, and then 5 mg tablets to be starting on     the next day.  3. Lovenox 30 mg injected q.12h., until stopped by [place].  4. Ambien 5 mg one to two h.s. p.r.n. sleep.  5. Colace 100 mg b.i.d.  6. Iron sulfate 325 mg one daily x1 month.   ACTIVITY:  As per physical therapy.   DIET:  No restrictions.   WOUND CARE:  Keep the wound clean and dry.  Cover the knee.  See the wound  instruction sheet for continued instructions.   FOLLOWUP:  Follow back up with [place] in 10 to 14 days for recheck evaluation.      Oris Drone Petrarca, P.A.-C.                Loreta Ave, M.D.    BDP/MEDQ  D:  08/28/2003  T:  08/29/2003  Job:  [id number]

## 2010-07-11 ENCOUNTER — Other Ambulatory Visit (HOSPITAL_COMMUNITY): Payer: Self-pay | Admitting: Pulmonary Disease

## 2010-07-12 ENCOUNTER — Ambulatory Visit (HOSPITAL_COMMUNITY): Payer: Medicare HMO

## 2010-07-14 ENCOUNTER — Ambulatory Visit (HOSPITAL_COMMUNITY): Payer: Medicare HMO | Admitting: Specialist

## 2010-07-19 ENCOUNTER — Ambulatory Visit (HOSPITAL_COMMUNITY): Payer: Medicare HMO | Admitting: Specialist

## 2010-07-19 ENCOUNTER — Ambulatory Visit (HOSPITAL_COMMUNITY)
Admission: RE | Admit: 2010-07-19 | Discharge: 2010-07-19 | Disposition: A | Discharge status: Home / Self Care | Payer: Medicare HMO | Source: Ambulatory Visit | Attending: Pulmonary Disease | Admitting: Pulmonary Disease

## 2010-07-19 DIAGNOSIS — M818 Other osteoporosis without current pathological fracture: Secondary | ICD-10-CM | POA: Insufficient documentation

## 2010-07-21 ENCOUNTER — Ambulatory Visit (HOSPITAL_COMMUNITY): Payer: Medicare HMO | Admitting: Specialist

## 2010-10-21 ENCOUNTER — Other Ambulatory Visit: Payer: Self-pay

## 2010-10-21 ENCOUNTER — Encounter: Payer: Self-pay | Admitting: Emergency Medicine

## 2010-10-21 ENCOUNTER — Emergency Department (HOSPITAL_COMMUNITY): Payer: Medicare HMO

## 2010-10-21 ENCOUNTER — Inpatient Hospital Stay (HOSPITAL_COMMUNITY)
Admission: EM | Admit: 2010-10-21 | Discharge: 2010-10-23 | DRG: 069 | Disposition: A | Discharge status: Home / Self Care | Payer: Medicare HMO | Attending: Pulmonary Disease | Admitting: Pulmonary Disease

## 2010-10-21 DIAGNOSIS — I10 Essential (primary) hypertension: Secondary | ICD-10-CM | POA: Diagnosis present

## 2010-10-21 DIAGNOSIS — K219 Gastro-esophageal reflux disease without esophagitis: Secondary | ICD-10-CM | POA: Diagnosis present

## 2010-10-21 DIAGNOSIS — I639 Cerebral infarction, unspecified: Secondary | ICD-10-CM

## 2010-10-21 DIAGNOSIS — G459 Transient cerebral ischemic attack, unspecified: Principal | ICD-10-CM | POA: Diagnosis present

## 2010-10-21 DIAGNOSIS — Z8673 Personal history of transient ischemic attack (TIA), and cerebral infarction without residual deficits: Secondary | ICD-10-CM

## 2010-10-21 DIAGNOSIS — E039 Hypothyroidism, unspecified: Secondary | ICD-10-CM | POA: Diagnosis present

## 2010-10-21 HISTORY — DX: Unspecified osteoarthritis, unspecified site: M19.90

## 2010-10-21 HISTORY — DX: Essential (primary) hypertension: I10

## 2010-10-21 LAB — DIFFERENTIAL
Basophils Relative: 0 % (ref 0–1)
Eosinophils Absolute: 0.2 10*3/uL (ref 0.0–0.7)
Eosinophils Relative: 2 % (ref 0–5)
Neutrophils Relative %: 55 % (ref 43–77)

## 2010-10-21 LAB — CK TOTAL AND CKMB (NOT AT ARMC)
Relative Index: INVALID (ref 0.0–2.5)
Total CK: 62 U/L (ref 7–177)

## 2010-10-21 LAB — PROTIME-INR
INR: 0.98 (ref 0.00–1.49)
Prothrombin Time: 13.2 seconds (ref 11.6–15.2)

## 2010-10-21 LAB — COMPREHENSIVE METABOLIC PANEL
ALT: 20 U/L (ref 0–35)
Albumin: 3.4 g/dL — ABNORMAL LOW (ref 3.5–5.2)
Alkaline Phosphatase: 73 U/L (ref 39–117)
Calcium: 8.9 mg/dL (ref 8.4–10.5)
GFR calc Af Amer: >60 mL/min (ref >60)
Glucose, Bld: 110 mg/dL — ABNORMAL HIGH (ref 70–99)
Potassium: 4.3 mEq/L (ref 3.5–5.1)
Sodium: 137 mEq/L (ref 135–145)
Total Protein: 5.7 g/dL — ABNORMAL LOW (ref 6.0–8.3)

## 2010-10-21 LAB — TROPONIN I: Troponin I: <0.3 ng/mL (ref <0.30)

## 2010-10-21 LAB — CBC
MCH: 32.8 pg (ref 26.0–34.0)
MCHC: 34.8 g/dL (ref 30.0–36.0)
MCV: 94.2 fL (ref 78.0–100.0)
Platelets: 243 10*3/uL (ref 150–400)
RDW: 12.7 % (ref 11.5–15.5)

## 2010-10-21 LAB — GLUCOSE, CAPILLARY: Glucose-Capillary: 129 mg/dL — ABNORMAL HIGH (ref 70–99)

## 2010-10-21 MED ORDER — CLOPIDOGREL BISULFATE 75 MG PO TABS
75.0000 mg | ORAL_TABLET | Freq: Every day | ORAL | Status: DC
  Administered 2010-10-22 – 2010-10-23 (×2): 75 mg via ORAL
  Filled 2010-10-21 (×2): qty 1

## 2010-10-21 MED ORDER — DULOXETINE HCL 60 MG PO CPEP
60.0000 mg | ORAL_CAPSULE | Freq: Every day | ORAL | Status: DC
  Administered 2010-10-22 – 2010-10-23 (×2): 60 mg via ORAL
  Filled 2010-10-21 (×2): qty 1

## 2010-10-21 MED ORDER — LEVOTHYROXINE SODIUM 88 MCG PO TABS
88.0000 ug | ORAL_TABLET | Freq: Every day | ORAL | Status: DC
  Administered 2010-10-22 – 2010-10-23 (×2): 88 ug via ORAL
  Filled 2010-10-21 (×3): qty 1

## 2010-10-21 MED ORDER — DILTIAZEM HCL ER COATED BEADS 120 MG PO CP24
120.0000 mg | ORAL_CAPSULE | Freq: Every day | ORAL | Status: DC
  Administered 2010-10-22 – 2010-10-23 (×2): 120 mg via ORAL
  Filled 2010-10-21 (×2): qty 1

## 2010-10-21 MED ORDER — ENOXAPARIN SODIUM 40 MG/0.4ML ~~LOC~~ SOLN
40.0000 mg | SUBCUTANEOUS | Status: DC
  Administered 2010-10-22: 40 mg via SUBCUTANEOUS
  Filled 2010-10-21 (×3): qty 0.4

## 2010-10-21 MED ORDER — SODIUM CHLORIDE 0.9 % IJ SOLN
3.0000 mL | INTRAMUSCULAR | Status: DC | PRN
  Filled 2010-10-21: qty 3

## 2010-10-21 MED ORDER — SODIUM CHLORIDE 0.9 % IJ SOLN
3.0000 mL | Freq: Two times a day (BID) | INTRAMUSCULAR | Status: DC
  Administered 2010-10-21 – 2010-10-22 (×3): 3 mL via INTRAVENOUS
  Filled 2010-10-21 (×3): qty 3

## 2010-10-21 MED ORDER — DILTIAZEM HCL ER COATED BEADS 240 MG PO CP24: 240.0000 mg | ORAL_CAPSULE | Freq: Every day | ORAL | Status: DC

## 2010-10-21 NOTE — ED Notes (Signed)
Pt stating the feeling is coming back in her rt arm a little

## 2010-10-21 NOTE — ED Notes (Signed)
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 Patient reports that she could not get her hand to do what she wanted it to. Patient reports her hand is still numb. No facial droop noted. Right hand grip weaker than left, but moderate in strength, No pronator drift noted. Speech clear. Pupils PERRL.

## 2010-10-21 NOTE — ED Notes (Signed)
Set up for and assist neuro consult via web.

## 2010-10-21 NOTE — ED Provider Notes (Addendum)
History     CSN: [id number] Arrival date & time: 10/21/2010  7:04 PM  Chief Complaint  Patient presents with  . Numbness   HPI Comments: Patient states that she has a history of possible stroke (visual), hypertension and arthritis. She had been mopping the floors all afternoon and noticed that she developed right hand and arm numbness and weakness. She also developed mild weakness of her right leg. This was acute in onset approximately 3:00 PM and have been persistent and not improving. She did not have any change in her speech, vision, and no facial droop. There is no associated cough, fever, nausea, vomiting, dysuria, diarrhea, rash, swelling.   The history is provided by the patient and the spouse.    Past Medical History  Diagnosis Date  . Arthritis   . Hypertension     Past Surgical History  Procedure Date  . Total knee arthroplasty   . Carotid stent   . Shoulder surgery   . Abdominal hysterectomy   . Cholecystectomy   . Back surgery     No family history on file.  History  Substance Use Topics  . Smoking status: Current Some Day Smoker  . Smokeless tobacco: Not on file  . Alcohol Use: No    OB History    Grav Para Term Preterm Abortions TAB SAB Ect Mult Living                  Review of Systems  All other systems reviewed and are negative.    Physical Exam  BP 125/64  Pulse 85  Temp(Src) 98.4 F (36.9 C) (Oral)  Resp 16  Ht 5' 1.5" (1.562 m)  Wt 137 lb (62.143 kg)  BMI 25.47 kg/m2  SpO2 98%  Physical Exam  Nursing note and vitals reviewed. Constitutional: She appears well-developed and well-nourished. No distress.  HENT:  Head: Normocephalic and atraumatic.  Mouth/Throat: Oropharynx is clear and moist. No oropharyngeal exudate.  Eyes: Conjunctivae and EOM are normal. Pupils are equal, round, and reactive to light. Right eye exhibits no discharge. Left eye exhibits no discharge. No scleral icterus.  Neck: Normal range of motion. Neck supple. No  JVD present. No thyromegaly present.  Cardiovascular: Normal rate, regular rhythm, normal heart sounds and intact distal pulses.  Exam reveals no gallop and no friction rub.   No murmur heard. Pulmonary/Chest: Effort normal and breath sounds normal. No respiratory distress. She has no wheezes. She has no rales.  Abdominal: Soft. Bowel sounds are normal. She exhibits no distension and no mass. There is no tenderness.  Musculoskeletal: Normal range of motion. She exhibits no edema and no tenderness.  Lymphadenopathy:    She has no cervical adenopathy.  Neurological: She is alert. Coordination normal.       Slight weakness to the right upper extremity, numbness of the right hand and forearm, decreased sensation to both light touch and pinprick of the right arm and forearm and hand. Normal sensation to the leg, normal strength and leg both left and right. Speech is clear and normal face without facial droop, cranial nerves III through XII intact without any abnormality.  Skin: Skin is warm and dry. No rash noted. No erythema.  Psychiatric: She has a normal mood and affect. Her behavior is normal.    ED Course  Procedures  MDM Patient has signs and symptoms consistent with an acute stroke. This would've been acute in onset approximately 3:00 in by her arrival and evaluation she is past the  window for from a lytic therapy. CT scan immediately ordered, blood work, EKG. teleneurology consult ordered.   Evaluation done by telephone neurology.  He agreed that this is probably a stroke. Dr. Hildred Laser recommends admission for further workup given the patient's history of carotid endarterectomy after stroke. As recommended.\  Results for orders placed during the hospital encounter of 10/21/10  PROTIME-INR      Component Value Range   Prothrombin Time 13.2  11.6 - 15.2 (seconds)   INR 0.98  0.00 - 1.49   APTT      Component Value Range   aPTT 26  24 - 37 (seconds)  CBC      Component Value Range    WBC 6.7  4.0 - 10.5 (K/uL)   RBC 4.33  3.87 - 5.11 (MIL/uL)   Hemoglobin 14.2  12.0 - 15.0 (g/dL)   HCT 19.1  47.8 - 29.5 (%)   MCV 94.2  78.0 - 100.0 (fL)   MCH 32.8  26.0 - 34.0 (pg)   MCHC 34.8  30.0 - 36.0 (g/dL)   RDW 62.1  30.8 - 65.7 (%)   Platelets 243  150 - 400 (K/uL)  DIFFERENTIAL      Component Value Range   Neutrophils Relative 55  43 - 77 (%)   Neutro Abs 3.7  1.7 - 7.7 (K/uL)   Lymphocytes Relative 34  12 - 46 (%)   Lymphs Abs 2.3  0.7 - 4.0 (K/uL)   Monocytes Relative 9  3 - 12 (%)   Monocytes Absolute 0.6  0.1 - 1.0 (K/uL)   Eosinophils Relative 2  0 - 5 (%)   Eosinophils Absolute 0.2  0.0 - 0.7 (K/uL)   Basophils Relative 0  0 - 1 (%)   Basophils Absolute 0.0  0.0 - 0.1 (K/uL)  COMPREHENSIVE METABOLIC PANEL      Component Value Range   Sodium 137  135 - 145 (mEq/L)   Potassium 4.3  3.5 - 5.1 (mEq/L)   Chloride 100  96 - 112 (mEq/L)   CO2 29  19 - 32 (mEq/L)   Glucose, Bld 110 (*) 70 - 99 (mg/dL)   BUN 14  6 - 23 (mg/dL)   Creatinine, Ser 8.46  0.50 - 1.10 (mg/dL)   Calcium 8.9  8.4 - 96.2 (mg/dL)   Total Protein 5.7 (*) 6.0 - 8.3 (g/dL)   Albumin 3.4 (*) 3.5 - 5.2 (g/dL)   AST 19  0 - 37 (U/L)   ALT 20  0 - 35 (U/L)   Alkaline Phosphatase 73  39 - 117 (U/L)   Total Bilirubin 0.4  0.3 - 1.2 (mg/dL)   GFR calc non Af Amer >60  >60 (mL/min)   GFR calc Af Amer >60  >60 (mL/min)  CK TOTAL AND CKMB      Component Value Range   Total CK 62  7 - 177 (U/L)   CK, MB 3.5  0.3 - 4.0 (ng/mL)   Relative Index RELATIVE INDEX IS INVALID  0.0 - 2.5   TROPONIN I      Component Value Range   Troponin I <0.30  <0.30 (ng/mL)  GLUCOSE, CAPILLARY      Component Value Range   Glucose-Capillary 129 (*) 70 - 99 (mg/dL)   Ct Head Wo Contrast  10/21/2010  *RADIOLOGY REPORT*  Clinical Data: Right arm weakness  CT HEAD WITHOUT CONTRAST  Technique:  Contiguous axial images were obtained from the base of the skull through the vertex without  contrast.  Comparison: 09/29/2004   Findings: No skull fracture is noted.  Paranasal sinuses and mastoid air cells are unremarkable.  Chronic small metallic foreign body medial aspect of the left  eye globe again noted.  No intracranial hemorrhage, mass effect or midline shift.  No acute infarction.  Stable cerebral atrophy.  Stable periventricular and subcortical white matter disease.  Stable lacunar infarct in the left frontal lobe. No mass lesion is noted on this unenhanced scan.  IMPRESSION: No acute intracranial abnormality.  Stable atrophy and chronic white matter disease.  Original Report Authenticated By: Natasha Mead, M.D.    ED ECG REPORT   Date: 10/21/2010 19:53  Rate: 81  Rhythm: normal sinus rhythm  QRS Axis: normal  Intervals: normal  ST/T Wave abnormalities: normal  Conduction Disutrbances:none  Narrative Interpretation: no ischemia, no afib  Old EKG Reviewed: unchanged from 11/25/09   Vida Roller, MD 10/21/10 2113  D/w Dr. Felecia Shelling who will admit.  Vida Roller, MD 10/21/10 2113

## 2010-10-22 ENCOUNTER — Inpatient Hospital Stay (HOSPITAL_COMMUNITY): Payer: Medicare HMO

## 2010-10-22 NOTE — Progress Notes (Signed)
Physical Therapy Evaluation Patient Name: [name] [id number] Date: 10/22/2010 Problem List: There is no problem list on file for this patient.  Past Medical History:  Past Medical History  Diagnosis Date  . Arthritis   . Hypertension    Past Surgical History:  Past Surgical History  Procedure Date  . Total knee arthroplasty   . Carotid stent   . Shoulder surgery   . Abdominal hysterectomy   . Cholecystectomy   . Back surgery   . Eye surgery   . Rotator cuff repair     Precautions/Restrictions  Restrictions Weight Bearing Restrictions: No Prior Functioning  Home Living Type of Home: House Lives With: Alone Receives Help From: Family Home Layout: One level Home Access: Stairs to enter Entrance Stairs-Rails: Right Entrance Stairs-Number of Steps: 3 Bathroom Shower/Tub: Engineer, manufacturing systems: Standard Bathroom Accessibility: Yes Home Adaptive Equipment: Walker - rolling Prior Function Level of Independence: Independent with basic ADLs Driving: Yes Cognition Cognition Orientation Level: Oriented X4 Sensation/Coordination   Extremity Assessment   Mobility (including Balance) Bed Mobility Bed Mobility: Yes Supine to Sit: 7: Independent Sit to Supine - Right: 7: Independent Transfers Transfers: Yes Sit to Stand: 7: Independent Ambulation/Gait Ambulation/Gait: Yes Ambulation/Gait Assistance: 7: Independent Ambulation Distance (Feet): 300 Feet Assistive device: None Gait Pattern: Within Functional Limits Stairs: Yes Stairs Assistance: 6: Modified independent (Device/Increase time) Stair Management Technique: One rail Right Number of Stairs: 3   Balance Balance Assessed: Yes Dynamic Standing Balance Dynamic Standing - Balance Support: During functional activity Dynamic Standing - Level of Assistance: 7: Independent Dynamic Standing - Balance Activities: Reaching across midline;Reaching for objects Exercise     End of Session PT - End of  Session Equipment Utilized During Treatment: Gait belt Activity Tolerance: Patient tolerated treatment well Patient left: in chair Nurse Communication: Mobility status for ambulation General Behavior During Session: [name] for tasks performed Cognition: [name] for tasks performed PT Assessment/Plan/Recommendation PT Assessment Clinical Impression Statement: Pt is independent with all mobility  PT Recommendation/Assessment: Patent does not need any further PT services No Skilled PT: Patient is independent with all acitivity/mobility PT Goals    [name] 10/22/2010, 10:09 AM

## 2010-10-22 NOTE — H&P (Signed)
NAMECIENA, [name]             ACCOUNT NO.:  [id number]  MEDICAL RECORD NO.:  [id number]  LOCATION:  A313                          FACILITY:  [facility]  PHYSICIAN:  Tesfaye D. Felecia Shelling, MD   DATE OF BIRTH:  26-May-1935  DATE OF ADMISSION:  10/21/2010 DATE OF DISCHARGE:  LH                             HISTORY & PHYSICAL   CHIEF COMPLAINT:  Right-sided weakness.  HISTORY OF PRESENT ILLNESS:  This is a 75 year old female patient of Dr. Kari Baars with history of multiple medical illnesses, came to emergency room with right-sided weakness and numbness.  By that time the patient arrived to emergency room, her symptom had been over 4 hours. She was evaluated as acute stroke.  CT scan of the head was negative. Her symptoms started improving while she was in the emergency room.  Her strength and numbness are significantly improved before she was admitted.  The patient was started on Plavix and telephone consultation was done with a Neurology, who advised to admit and do further workup. She was admitted under telemetry with neuro check.  REVIEW OF SYSTEMS:  The patient has no fever, chills, headache, cough, shortness of breath, chest pain, nausea, vomiting, abdominal pain, dysuria, urgency, or frequency of urination.  PAST MEDICAL HISTORY: 1. Gastroesophageal reflux disease. 2. Hypothyroidism. 3. Hypertension. 4. Hypercholesterolemia. 5. History of TIA or stroke in 2007. 6. History of right carotid atherectomy.  CURRENT MEDICATIONS: 1. Aspirin 325 mg p.o. daily. 2. Diltiazem 120 mg p.o. daily. 3. Cymbalta 60 mg daily. 4. Levothyroxine 88 mcg daily.  SOCIAL HISTORY:  The patient is a widow.  She lives alone.  The patient used to smoke about one-pack of cigarette per day and she stopped about 6 months back.  However, sometimes she may smoke a few cigarettes.  She drinks alcohol occasionally.  FAMILY HISTORY:  Her mother had carcinoma of the pancreas at the age of 71.  Her dad died  due to metastatic carcinoma of the prostate.  PHYSICAL EXAMINATION:  GENERAL:  The patient is alert, awake, and comfortable. VITAL SIGNS:  Blood pressure 125/64, pulse 62, respiratory rate 16, temperature 97.5 degrees Fahrenheit. HEENT:  Pupils are equal and reactive. NECK:  Supple. CHEST:  Clear. LUNGS:  Lung fields good air entry. CARDIOVASCULAR SYSTEM:  First and second heart sounds heard.  No murmur. No gallop. ABDOMEN:  Soft and lax.  Bowel sounds positive.  No mass or organomegaly. EXTREMITIES:  No leg edema. NEUROLOGY:  The patient is alert, awake, and oriented to 3.  No significant weakness on the right side.  LABS ON ADMISSION:  CBC:  WBC 6.7, hemoglobin 14.24, hematocrit 40.8, and platelets 243.  Sodium 137, potassium 4.5, chloride 100, carbon dioxide 29, glucose 110, BUN 14, creatinine 0.3, calcium 8.9.  ASSESSMENT: 1. Probably transient ischemic attack. 2. Hypertension. 3. Gastroesophageal reflux disease. 4. Hypothyroidism.  PLAN:  We will continue the patient on neuro check.  We will do carotid Doppler.  MRI of the brain could not be done due to her implant in her eyes.     Tesfaye D. Felecia Shelling, MD     TDF/MEDQ  D:  10/22/2010  T:  10/22/2010  Job:  [id number]

## 2010-10-22 NOTE — Progress Notes (Signed)
[name], [name]             ACCOUNT NO.:  [id number]  MEDICAL RECORD NO.:  [id number]  LOCATION:  A313                          FACILITY:  [facility]  PHYSICIAN:  Mitul Hallowell D. Felecia Shelling, MD   DATE OF BIRTH:  [birth year]  DATE OF PROCEDURE:  10/22/2010 DATE OF DISCHARGE:                                PROGRESS NOTE   SUBJECTIVE:  The patient feels much better.  Her weakness of the right- sided numbness has resolved.  OBJECTIVE:  GENERAL:  The patient is alert, awake and comfortable. VITAL SIGNS:  Blood pressure 125/64, pulse 62, respiratory rate 16, temperature 97.5 degrees Fahrenheit. CHEST:  Clear lung fields with good air entry. CARDIOVASCULAR SYSTEM:  First and second heart sound heard.  No murmur. No gallop. ABDOMEN:  Soft and lax.  Bowel sound is positive.  No mass, no organomegaly. EXTREMITIES:  No leg edema.  LABORATORY DATA:  Glucose 129, CPK total 62, CK-MB 3.5, and troponin less than 0.30.  ASSESSMENT: 1. Probably transient ischemic attack. 2. Hypothyroidism. 3. History of gastroesophageal reflux disease.  PLAN:  We will do carotid Doppler.  We will continue neuro check.  If the patient continued to improve and her weakness stabilized, we will plan to discharge the patient in a.m.     Aishi Courts D. Felecia Shelling, MD     TDF/MEDQ  D:  10/22/2010  T:  10/22/2010  Job:  [id number]

## 2010-10-23 NOTE — Discharge Summary (Signed)
NAMECATHARINA, [name]             ACCOUNT NO.:  [id number]  MEDICAL RECORD NO.:  [id number]  LOCATION:  A313                          FACILITY:  [facility]  PHYSICIAN:  Shimon Trowbridge D. Felecia Shelling, MD   DATE OF BIRTH:  [birth year]  DATE OF ADMISSION:  10/21/2010 DATE OF DISCHARGE:  09/02/2012LH                              DISCHARGE SUMMARY   DISCHARGE DIAGNOSES: 1. Probably transient ischemic attack. 2. History of hypertension. 3. History of previous cerebrovascular accident. 4. Hypothyroidism. 5. Gastroesophageal reflux disease.  DISCHARGE MEDICATIONS: 1. Aspirin 81 mg p.o. daily. 2. Cardizem CD 120 mg p.o. daily. 3. Cymbalta 60 mg daily. 4. Levothyroxine 88 mcg daily.  DISPOSITION:  The patient will be discharged to home in stable condition.  DISCHARGE INSTRUCTIONS:  The patient will be followed with Dr. Juanetta Gosling in 1 week duration.  LABORATORY DATA ON DISCHARGE:  Glucose 129.  CPK total 62, CK-MB 3.5, and troponin less than 0.05.  CBC; WBC 6.7, hemoglobin 14.2, hematocrit 40.8, platelet 243.  Sodium 137, potassium 4.3, chloride 100, carbon dioxide 29, glucose 100, BUN 14, creatinine 0.3, calcium 8.9.  HOSPITAL COURSE:  This is a 75 year old female patient with history of multiple medical illnesses, came to emergency room with complaint of right-sided weakness and numbness.  Her symptoms had lasted four hours before she reached due to emergency room.  CT scan of the head was negative.  While the patient was being evaluated, her symptoms started improving.  There was no significant weakness that could be demonstrated.  The patient was admitted and the neuro check was done.  She had a carotid Doppler, which showed less than 50% stenosis.  The patient overall improved and her symptoms resolved.  She is being discharged to home to continue follow up with Dr. Juanetta Gosling.  The patient will continue on aspirin.     [name] D. Felecia Shelling, MD     TDF/MEDQ  D:  10/23/2010  T:  10/23/2010  Job:   [id number]

## 2010-11-01 ENCOUNTER — Ambulatory Visit (HOSPITAL_COMMUNITY)
Admission: RE | Admit: 2010-11-01 | Discharge: 2010-11-01 | Disposition: A | Discharge status: Home / Self Care | Payer: Medicare HMO | Source: Ambulatory Visit | Attending: Physical Medicine and Rehabilitation | Admitting: Physical Medicine and Rehabilitation

## 2010-11-01 DIAGNOSIS — M6281 Muscle weakness (generalized): Secondary | ICD-10-CM | POA: Insufficient documentation

## 2010-11-01 DIAGNOSIS — M25519 Pain in unspecified shoulder: Secondary | ICD-10-CM | POA: Insufficient documentation

## 2010-11-01 DIAGNOSIS — IMO0001 Reserved for inherently not codable concepts without codable children: Secondary | ICD-10-CM | POA: Insufficient documentation

## 2010-11-01 DIAGNOSIS — M542 Cervicalgia: Secondary | ICD-10-CM | POA: Insufficient documentation

## 2010-11-01 NOTE — Progress Notes (Signed)
Physical Therapy Evaluation -[employer] Completed Eval in Media Section Patient Details  Name: [name] MRN: [id number] Date of Birth: Aug 17, 1935  Today's Date: 11/01/2010 Time: 6578-4696 Time Calculation (min): 35 min Charges: 1 eval Visit#: 1 of 8 Re-eval: 12/01/10  Past Medical History:  Past Medical History  Diagnosis Date  . Arthritis   . Hypertension    Past Surgical History:  Past Surgical History  Procedure Date  . Total knee arthroplasty   . Carotid stent   . Shoulder surgery   . Abdominal hysterectomy   . Cholecystectomy   . Back surgery   . Eye surgery   . Rotator cuff repair     Subjective Symptoms/Limitations Symptoms: Pt reports that she was mopping and started having RUE numbness and tingling.  She was hospitalized for possible stroke, and had negative findings.  C/co is neck pain and mild hand numbeness.  Pt reports that x-ray showed a lot of arthritis, bone spurs and increased inflammation How long can you sit comfortably?: 15-20 minutes at the computer How long can you stand comfortably?: no difficulty How long can you walk comfortably?: no difficulty.  Pain Assessment Currently in Pain?: Yes Pain Score:   3 Pain Location: Neck Pain Type: Chronic pain  Palpation: Increased pain and tenderness with significant spasms to medial border of scapula and Bilateral UT Prior Functioning  Prior Function Level of Independence: Independent with basic ADLs Driving: Yes Vocation: Retired Leisure: Hobbies-yes (Comment) Comments: Enjoys working at home, swimming, staying active  Assessment Cervical AROM Cervical Flexion: WFL Cervical Extension: [facility] Cervical - Right Side Bend: 15 cm Cervical - Left Side Bend: 15 cm Cervical - Right Rotation: 14 cm Cervical - Left Rotation: 17 cm Cervical Strength Cervical Flexion: 3/5 Cervical Extension: 4/5 Cervical - Right Side Bend: 3/5 Cervical - Left Side Bend: 3/5 Cervical - Right Rotation:  3/5 Cervical - Left Rotation: 3/5  Exercise/Treatments Stretches Levator Stretch: 30 seconds;Limitations Levator Stretch Limitations: BUE Neck Stretch: 30 seconds;Limitations Neck Exercises Neck Retraction: Seated;5 reps;Limitations Neck Retraction Limitations: 5 sec hold Neck Lateral Flexion - Right: 5 reps;Seated Neck Lateral Flexion - Left: 5 reps;Seated Neck Rotation - Right: 5 reps;Seated Neck Rotation - Left: 5 reps;Seated  Physical Therapy Assessment and Plan PT Assessment and Plan PT Frequency: Min 2X/week PT Duration: 4 weeks PT Treatment/Interventions: Therapeutic exercise;Patient/family education;Other (comment) (Manual and modalities for pain control) PT Plan: mechanical cervical traction, UBE, x-v, W backs, corner stretch    Goals PT Short Term Goals PT Short Term Goal 1: Pt will be independent w/HEP in order to maximize therapeutic effect.  PT Short Term Goal 2: Pt will report decreased pain to 3/10 for 50% of her day while doing her daily activities.  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 PT Short Term Goal 4: Pt will present with improved posture for an entire therapy regime. PT Long Term Goals PT Long Term Goal 1: Pt will sit for 30 minutes without an increase in pain to cervical.   PT Long Term Goal 2: Improve cervical ROM to [facility] in order to turn her head in the car while driving withou an increase pain. Long Term Goal 3: Pt will report decrease in radicular pain to RUE by 75% while driving for over 2 hours.   Problem List Patient Active Problem List  Diagnoses  . Cervical pain    PT - End of Session Activity Tolerance: Patient tolerated  treatment well   Hend Mccarrell 11/01/2010, 3:24 PM  Physician Documentation Your signature is required to indicate approval of the treatment plan as stated above.  Please sign and either send electronically or make a copy of this report for your files and return  this physician signed original.   Please mark one 1.__approve of plan  2. ___approve of plan with the following conditions.   ______________________________                                                          _____________________ Physician Signature                                                                                                             Date

## 2010-11-01 NOTE — Progress Notes (Signed)
Encounter addended by: Angela Lilly on: 11/01/2010  3:17 PM<BR>     Documentation filed: Flowsheet VN

## 2010-11-08 ENCOUNTER — Ambulatory Visit (HOSPITAL_COMMUNITY)
Admission: RE | Admit: 2010-11-08 | Discharge: 2010-11-08 | Disposition: A | Discharge status: Home / Self Care | Payer: Medicare HMO | Source: Ambulatory Visit

## 2010-11-08 NOTE — Progress Notes (Signed)
Physical Therapy Treatment Patient Details  Name: FELISHA CLAYTOR MRN: [id number] Date of Birth: [birth year]  Today's Date: 11/08/2010 Time: 5284-1324 Time Calculation (min): 53 min Visit#: 2  of 8   Re-eval: 12/01/10  Charge: therex: 32 min Mechanical cervical traction 15' (17') MHP x 1 unit  Subjective: Symptoms/Limitations Symptoms: 4/10 R UE pain, numbness down to wrist. Pain Assessment Currently in Pain?: Yes Pain Score:   4 Pain Location: Neck  Precautions/Restrictions     Mobility (including Balance)       Exercise/Treatments UBE 4' total (2' fwd/2'bkwd) Corner st. 2x 30" Levator St. 3x 30" Upper Traps st 2x 30" Lateral flexion 5x 5" Rotation 5x 5" Retraction 5x 5" X to v 10x Wback 10x Mechanical cervical traction with MHP x 15' (17') Modalities Modalities: Traction Traction Type of Traction: Cervical Min (lbs): 7 Max (lbs): 12 Hold Time: 60 Rest Time: 20 Time: 17  Physical Therapy Assessment and Plan PT Assessment and Plan Clinical Impression Statement: Pt reports she has increased pain with activities which cause increased pain with pec stretch and required manual assistances for W-back for proper posture.  Needed min VC for proper position of stretches. PT Plan: Continue to progress cervical ROM and flexibility    Goals    Problem List Patient Active Problem List  Diagnoses  . Cervical pain    PT - End of Session Activity Tolerance: Patient tolerated treatment well  Juel Burrow 11/08/2010, 3:16 PM

## 2010-11-10 ENCOUNTER — Ambulatory Visit (HOSPITAL_COMMUNITY)
Admission: RE | Admit: 2010-11-10 | Discharge: 2010-11-10 | Disposition: A | Discharge status: Home / Self Care | Payer: Medicare HMO | Source: Ambulatory Visit | Attending: *Deleted | Admitting: *Deleted

## 2010-11-10 NOTE — Progress Notes (Signed)
Physical Therapy Treatment Patient Details  Name: LOURENE HOSTON MRN: [id number] Date of Birth: 01/04/1936  Today's Date: 11/10/2010 Time: 0865-7846 Time Calculation (min): 51 min Visit#: 3  of 8   Re-eval: 12/01/10  Charge: therex 30 min Cervical traction x 15 (17') MHP x 15 min ( 1 unit)  Subjective: Symptoms/Limitations Symptoms: 4/10 R UE pain, numbness down to wrist. Pain Assessment Currently in Pain?: Yes Pain Score:   4 Pain Location: Neck  Precautions/Restrictions     Mobility (including Balance)       Exercise/Treatments Stretches Upper Trapezius Stretch: 3 reps;30 seconds Levator Stretch: 3 reps;30 seconds;Limitations Levator Stretch Limitations: BUE Corner Stretch: 3 reps;30 seconds Neck Exercises Neck Retraction: Seated;5 reps;Limitations Neck Retraction Limitations: 5 sec hold Neck Lateral Flexion - Right: 5 reps;Seated Neck Lateral Flexion - Left: 5 reps;Seated Neck Rotation - Right: 5 reps;Seated Neck Rotation - Left: 5 reps;Seated W Back: 10 reps X to V: Seated;15 reps Additional Neck Exercises UBE (Upper Arm Bike): 4' (2' fwd, 2' bkwd)  Modalities Modalities: Traction;Moist Heat Moist Heat Therapy Number Minutes Moist Heat: 15 Minutes Traction Type of Traction: Cervical Min (lbs): 8 Max (lbs): 13 Hold Time: 45 Rest Time: 15 Time: 15 (17')  Physical Therapy Assessment and Plan PT Assessment and Plan Clinical Impression Statement: Pt demonstrated most therex with good form/tech.  Pt lacking B shoulder ER, required manual assistance with pec st/ wback for posture. PT Plan: Continue to progress cervical ROM and flexibility; add shoulder PROM for IR/ER to assist with washing/curling hair, putting on bra straps.    Goals    Problem List Patient Active Problem List  Diagnoses  . Cervical pain    PT - End of Session Activity Tolerance: Patient tolerated treatment well General Behavior During Session: [facility] for tasks  performed Cognition: [facility] for tasks performed  Juel Burrow 11/10/2010, 3:54 PM

## 2010-11-14 LAB — DIFFERENTIAL
Basophils Absolute: 0.1
Basophils Relative: 1
Eosinophils Absolute: 0.1
Lymphocytes Relative: 26
Neutrophils Relative %: 63

## 2010-11-14 LAB — BASIC METABOLIC PANEL
CO2: 26
Calcium: 8.9
Creatinine, Ser: 0.68
GFR calc Af Amer: >60

## 2010-11-14 LAB — CBC
MCHC: 33
RBC: 4.71

## 2010-11-14 LAB — FERRITIN: Ferritin: 15 (ref 10–291)

## 2010-11-14 LAB — OCCULT BLOOD X 1 CARD TO LAB, STOOL: Fecal Occult Bld: NEGATIVE

## 2010-11-15 ENCOUNTER — Ambulatory Visit (HOSPITAL_COMMUNITY)
Admission: RE | Admit: 2010-11-15 | Discharge: 2010-11-15 | Disposition: A | Discharge status: Home / Self Care | Payer: Medicare HMO | Source: Ambulatory Visit

## 2010-11-15 NOTE — Progress Notes (Signed)
Physical Therapy Treatment Patient Details  Name: [name] MRN: [id number] Date of Birth: 1935-08-01  Today's Date: 11/15/2010 Time: 7829-5621 Time Calculation (min): 52 min Visit#: 4  of 8   Re-eval: 12/01/10  Charge: therex 30 min Cervical traction with MHP 17 min  Subjective: Symptoms/Limitations Symptoms: 4/10 cervical radiating down R UE today. Pain Assessment Currently in Pain?: Yes Pain Score:   4 Pain Location: Neck Pain Orientation: Right  Objective:   Exercise/Treatments Stretches Upper Trapezius Stretch: 3 reps;30 seconds Levator Stretch: 3 reps;30 seconds;Limitations Levator Stretch Limitations: BUE Corner Stretch: 3 reps;30 seconds Neck Exercises Neck Retraction: Seated;5 reps Neck Retraction Limitations: 5 sec hold Shoulder Extension: 10 reps;Theraband;Standing Theraband Level (Shoulder Extension): Level 3 (Green) Shoulder Horizontal ABduction: 10 reps;Standing;Theraband Theraband Level (Shoulder Horizontal Abduction): Level 3 (Green) Row: 10 reps;Standing;Theraband Theraband Level (Row): Level 3 (Green) W Back: 10 reps X to V: 10 reps Additional Neck Exercises UBE (Upper Arm Bike): 4' (2' fwd, 2' bkwd)  Modalities Modalities: Moist Heat Moist Heat Therapy Number Minutes Moist Heat: 15 Minutes Traction Type of Traction: Cervical Min (lbs): 8 Max (lbs): 13 Hold Time: 45 Rest Time: 15 Time: 15 (17')  Physical Therapy Assessment and Plan PT Assessment and Plan Clinical Impression Statement: Added tband for posture, min manual assistance for proper tech required.  Began PROM, able to reach near full ROM, pt still lacking B shoulder IR/ER actively. PT Plan: Continue progress cervical ROM and strength.    Goals    Problem List Patient Active Problem List  Diagnoses  . Cervical pain    PT - End of Session Activity Tolerance: Patient tolerated treatment well General Behavior During Session: [facility] for tasks performed Cognition: [facility]  for tasks performed  Juel Burrow 11/15/2010, 6:21 PM

## 2010-11-17 ENCOUNTER — Ambulatory Visit (HOSPITAL_COMMUNITY)
Admission: RE | Admit: 2010-11-17 | Discharge: 2010-11-17 | Disposition: A | Discharge status: Home / Self Care | Payer: Medicare HMO | Source: Ambulatory Visit | Attending: Pulmonary Disease | Admitting: Pulmonary Disease

## 2010-11-17 NOTE — Progress Notes (Signed)
Physical Therapy Treatment Patient Details  Name: [name] MRN: [id number] Date of Birth: [birth year]  Today's Date: 11/17/2010 Time: 4098-1191 Time Calculation (min): 55 min Visit#: 5  of 8   Re-eval: 12/01/10  Charge: therex 33 min Traction 15' (17') with MHP  Subjective: Symptoms/Limitations Symptoms: Shoulders feeling fine today. Pain Assessment Currently in Pain?: No/denies  Objective:   Exercise/Treatments Stretches Upper Trapezius Stretch: 3 reps;30 seconds Levator Stretch: 3 reps;30 seconds;Limitations Corner Stretch: 3 reps;30 seconds Neck Exercises Shoulder Extension: 15 reps;Theraband Theraband Level (Shoulder Extension): Level 3 (Green) Shoulder Horizontal ABduction: 15 reps;Theraband Theraband Level (Shoulder Horizontal Abduction): Level 3 (Green) Row: 15 reps;Theraband Theraband Level (Row): Level 3 (Green) W Back: 15 reps X to V: 15 reps Additional Neck Exercises UBE (Upper Arm Bike): 5' bkwd  Modalities Modalities: Moist Heat;Traction Moist Heat Therapy Number Minutes Moist Heat: 15 Minutes Moist Heat Location: Other (comment) (cervical) Traction Type of Traction: Cervical Min (lbs): 8 Max (lbs): 13 Hold Time: 45 Rest Time: 15 Time: 15 (17')  Physical Therapy Assessment and Plan PT Assessment and Plan Clinical Impression Statement: Added supine star gazers for increase ER ROM.  Pt able to complete actively but very slow secondary to weakness. PT Plan: Continue with current POC.    Goals    Problem List Patient Active Problem List  Diagnoses  . Cervical pain    PT - End of Session Activity Tolerance: Patient tolerated treatment well General Behavior During Session: [facility] for tasks performed Cognition: [facility] for tasks performed  Juel Burrow 11/17/2010, 4:37 PM

## 2010-11-18 LAB — BASIC METABOLIC PANEL
BUN: 20
Calcium: 9.4
Creatinine, Ser: 0.64
GFR calc non Af Amer: >60
Potassium: 4.1

## 2010-11-18 LAB — HEMOGLOBIN AND HEMATOCRIT, BLOOD: Hemoglobin: 12.1

## 2010-11-18 LAB — WOUND CULTURE

## 2010-11-22 ENCOUNTER — Ambulatory Visit (HOSPITAL_COMMUNITY)
Admission: RE | Admit: 2010-11-22 | Discharge: 2010-11-22 | Disposition: A | Discharge status: Home / Self Care | Payer: Medicare HMO | Source: Ambulatory Visit | Attending: Pulmonary Disease | Admitting: Pulmonary Disease

## 2010-11-22 DIAGNOSIS — M542 Cervicalgia: Secondary | ICD-10-CM | POA: Insufficient documentation

## 2010-11-22 DIAGNOSIS — M6281 Muscle weakness (generalized): Secondary | ICD-10-CM | POA: Insufficient documentation

## 2010-11-22 DIAGNOSIS — IMO0001 Reserved for inherently not codable concepts without codable children: Secondary | ICD-10-CM | POA: Insufficient documentation

## 2010-11-22 NOTE — Progress Notes (Signed)
Physical Therapy Treatment Patient Details  Name: [name] MRN: [id number] Date of Birth: 09-05-1935  Today's Date: 11/22/2010 Time: 4098-1191 Time Calculation (min): 50 min Visit#: 6  of 8   Re-eval: 12/01/10 Charges:  therex 30', traction 1 unit, moist heat 1 unit    Subjective: Symptoms/Limitations Symptoms: Pt. states she's about the same as last visit.  States she did something to her back on Saturday and now it's hurting too. Pain Assessment Currently in Pain?: Yes Pain Score:   4 Pain Location: Neck   Exercise/Treatments Stretches Upper Trapezius Stretch: 3 reps;30 seconds Levator Stretch: 3 reps;30 seconds;Limitations Corner Stretch: 3 reps;30 seconds Neck Exercises Neck Retraction: 10 reps;Seated Shoulder Extension: 15 reps;Theraband Theraband Level (Shoulder Extension): Level 3 (Green) Row: 15 reps;Theraband Theraband Level (Row): Level 3 (Green) Scapular Retraction: 15 reps;Theraband Theraband Level (Scapular Retraction): Level 3 (Green) W Back: 15 reps X to V: 15 reps Additional Neck Exercises UBE (Upper Arm Bike): 5' bkwd  Modalities Modalities: Moist Heat;Traction Moist Heat Therapy Number Minutes Moist Heat: 15 Minutes Moist Heat Location:  (cervical) Traction Type of Traction: Cervical Min (lbs): 8 Max (lbs): 14 Hold Time: 45 Rest Time: 15 Time: 15 (17')  Physical Therapy Assessment and Plan PT Assessment and Plan Clinical Impression Statement: Pt. able to complete therex today with min VC's , other than postural cues. PT Treatment/Interventions: Therapeutic exercise (Traction and Moist Heat) PT Plan: Continue per POC     Problem List Patient Active Problem List  Diagnoses  . Cervical pain    PT - End of Session Activity Tolerance: Patient tolerated treatment well General Behavior During Session: [facility] for tasks performed Cognition: [facility] for tasks performed  Lurena Nida 11/22/2010, 4:28 PM

## 2010-11-24 ENCOUNTER — Ambulatory Visit (HOSPITAL_COMMUNITY)
Admission: RE | Admit: 2010-11-24 | Discharge: 2010-11-24 | Disposition: A | Discharge status: Home / Self Care | Payer: Medicare HMO | Source: Ambulatory Visit | Attending: Pulmonary Disease | Admitting: Pulmonary Disease

## 2010-11-24 NOTE — Progress Notes (Signed)
Physical Therapy Treatment Patient Details  Name: TAMERA PINGLEY MRN: [id number] Date of Birth: August 25, 1935  Today's Date: 11/24/2010 Time: 9147-8295 Time Calculation (min): 50 min Visit#: 7  of 8   Re-eval: 12/01/10 Charges:  therex 26',Traction 1 unit, moist heat 1 unit    Subjective: Symptoms/Limitations Symptoms: Pt. states her back feels better; only having pain in Right shoulder and down arm today.  Just left the [facility] pool where she exercised for 1 hour. Pain Assessment Currently in Pain?: Yes Pain Score:   3 Pain Location: Shoulder Pain Orientation: Right  Exercise/Treatments Stretches Upper Trapezius Stretch: Limitations Upper Trapezius Stretch Limitations: HEP Levator Stretch: Limitations Levator Stretch Limitations: HEP Corner Stretch: 3 reps;30 seconds Neck Exercises Neck Retraction: Limitations Neck Retraction Limitations: HEP Shoulder Extension: 15 reps Theraband Level (Shoulder Extension): Level 3 (Green) Row: 15 reps Theraband Level (Row): Level 3 (Green) Scapular Retraction: 15 reps Theraband Level (Scapular Retraction): Level 3 (Green) W Back: 10 reps;Weight W Back Weights (lbs): 2# X to V: 10 reps Additional Neck Exercises UBE (Upper Arm Bike): 5' bkwd  Modalities Modalities: Moist Heat;Traction Moist Heat Therapy Number Minutes Moist Heat: 15 Minutes Moist Heat Location:  (Cervical with traction) Traction Type of Traction: Cervical Min (lbs): 10 Max (lbs): 15 Hold Time: 45 Rest Time: 15 Time: 15(17') with 4 steps up/down  Physical Therapy Assessment and Plan PT Assessment and Plan Clinical Impression Statement: added 2# weight to W-back exercise with pt. taking increased time to complete secondary to UE weakness.  Required manual postural cues with exercise. PT Treatment/Interventions: Therapeutic exercise (cervical traction with moist heat) PT Plan: Re-evaluate next visit.    Goals Home Exercise Program PT Goal: Perform Home  Exercise Program - Progress: Met PT Short Term Goals PT Short Term Goal 1: Pt will be independent w/HEP in order to maximize therapeutic effect.  PT Short Term Goal 1 - Progress: Met PT Short Term Goal 2: Pt will report decreased pain to 3/10 for 50% of her day while doing her daily activities.  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 PT Short Term Goal 3 - Progress: Progressing toward goal PT Short Term Goal 4: Pt will present with improved posture for an entire therapy regime. PT Short Term Goal 4 - Progress: Progressing toward goal PT Long Term Goals PT Long Term Goal 1: Pt will sit for 30 minutes without an increase in pain to cervical.   PT Long Term Goal 1 - Progress: Progressing toward goal PT Long Term Goal 2: Improve cervical ROM to [facility] in order to turn her head in the car while driving withou an increase pain. PT Long Term Goal 2 - Progress: Not met Long Term Goal 3: Pt will report decrease in radicular pain to RUE by 75% while driving for over 2 hours.  Long Term Goal 3 Progress: Not met  Problem List Patient Active Problem List  Diagnoses  . Cervical pain    PT - End of Session Activity Tolerance: Patient tolerated treatment well General Behavior During Session: [facility] for tasks performed Cognition: [facility] for tasks performed  Emeline Gins B 11/24/2010, 3:13 PM

## 2010-11-29 ENCOUNTER — Ambulatory Visit (HOSPITAL_COMMUNITY): Payer: Medicare HMO | Admitting: Physical Therapy

## 2010-11-29 ENCOUNTER — Ambulatory Visit (HOSPITAL_COMMUNITY)
Admission: RE | Admit: 2010-11-29 | Discharge: 2010-11-29 | Disposition: A | Discharge status: Home / Self Care | Payer: Medicare HMO | Source: Ambulatory Visit | Attending: Pulmonary Disease | Admitting: Pulmonary Disease

## 2010-11-29 NOTE — Progress Notes (Signed)
Physical Therapy Discharge Note  Patient Details  Name: [name] MRN: [id number] Date of Birth: [birth year]  Today's Date: 11/29/2010 Time: 4098-1191 Time Calculation (min): 31 min Charges: 25' TE, 1 ROM, 1 MMT Visit#: 8  of 8   Re-eval:      Past Medical History:  Past Medical History  Diagnosis Date  . Arthritis   . Hypertension    Past Surgical History:  Past Surgical History  Procedure Date  . Total knee arthroplasty   . Carotid stent   . Shoulder surgery   . Abdominal hysterectomy   . Cholecystectomy   . Back surgery   . Eye surgery   . Rotator cuff repair     Subjective Symptoms/Limitations Symptoms: "This weather is not helping with my pain"  Pt reports that overall she feels she has made some slight improvements and feels that overall she believes that she can continue at home.  Pain Assessment Currently in Pain?: Yes  Assessment Cervical AROM Cervical - Right Side Bend: 14 cm  Cervical - Left Side Bend: 14 cm  Cervical - Right Rotation: 12 cm  Cervical - Left Rotation: 15 cm Cervical Strength Cervical Flexion: 4/5 Cervical - Right Side Bend: 4/5 Cervical - Left Side Bend: 5/5 Cervical - Right Rotation: 4/5 Cervical - Left Rotation: 5/5  Exercise/Treatments Today's treatment focus on HEP for DC Stretches Upper Trapezius Stretch: 30 seconds Levator Stretch: 30 seconds Neck Exercises Neck Retraction: 10 reps W Back: 10 reps X to V: 15 reps Additional Neck Exercises UBE (Upper Arm Bike): 5 bkwd    Physical Therapy Assessment and Plan PT Assessment and Plan Clinical Impression Statement: Pt made improvements with ROM and strength.  Continues to have increased pain and radicular symptoms which limit her cervical and shoulder ROM.  She did not have any improvement with radicular symtoms even with exercise, flexibility and cervical traction. Recommend follow up with HEP.  PT Plan: D/C w/HEP    Goals PT Short Term Goals PT Short Term Goal  1: Pt will be independent w/HEP in order to maximize therapeutic effect.  PT Short Term Goal 1 - Progress: Met PT Short Term Goal 2: Pt will report decreased pain to 3/10 for 50% of her day while doing her daily activities.  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 PT Short Term Goal 3 - Progress: Not met PT Short Term Goal 4: Pt will present with improved posture for an entire therapy regime. PT Short Term Goal 4 - Progress: Met PT Long Term Goals PT Long Term Goal 1: Pt will sit for 30 minutes without an increase in pain to cervical.   PT Long Term Goal 1 - Progress: Met PT Long Term Goal 2: Improve cervical ROM to [facility] in order to turn her head in the car while driving withou an increase pain. Long Term Goal 3: Pt will report decrease in radicular pain to RUE by 75% while driving for over 2 hours.  Long Term Goal 3 Progress: Not met  Problem List Patient Active Problem List  Diagnoses  . Cervical pain       [name] 11/29/2010, 3:19 PM  Physician Documentation Your signature is required to indicate approval of the treatment plan as stated above.  Please sign and either send electronically or make a copy of this report for your files and return this physician  signed original.   Please mark one 1.__approve of plan  2. ___approve of plan with the following conditions.   ______________________________                                                          _____________________ Physician Signature                                                                                                             Date

## 2010-12-01 ENCOUNTER — Ambulatory Visit (HOSPITAL_COMMUNITY): Payer: Medicare HMO | Admitting: Physical Therapy

## 2010-12-06 LAB — CBC
HCT: 27 — ABNORMAL LOW
HCT: 41.6
Hemoglobin: 13.9
Hemoglobin: 9.3 — ABNORMAL LOW
MCHC: 33.4
MCHC: 34.3
MCV: 76.5 — ABNORMAL LOW
MCV: 77.6 — ABNORMAL LOW
RBC: 3.53 — ABNORMAL LOW
RDW: 20.4 — ABNORMAL HIGH
RDW: 20.5 — ABNORMAL HIGH

## 2010-12-06 LAB — PROTIME-INR: INR: 1

## 2010-12-06 LAB — URINALYSIS, ROUTINE W REFLEX MICROSCOPIC
Bilirubin Urine: NEGATIVE
Nitrite: POSITIVE — AB
Protein, ur: NEGATIVE
Specific Gravity, Urine: 1.02
Urobilinogen, UA: 0.2

## 2010-12-06 LAB — BASIC METABOLIC PANEL
CO2: 30
Calcium: 9.5
Chloride: 104
Glucose, Bld: 100 — ABNORMAL HIGH
Sodium: 141

## 2010-12-06 LAB — URINE MICROSCOPIC-ADD ON

## 2011-03-09 ENCOUNTER — Other Ambulatory Visit: Payer: Self-pay | Admitting: Obstetrics and Gynecology

## 2011-03-09 DIAGNOSIS — Z139 Encounter for screening, unspecified: Secondary | ICD-10-CM

## 2011-04-06 ENCOUNTER — Ambulatory Visit (HOSPITAL_COMMUNITY)
Admission: RE | Admit: 2011-04-06 | Discharge: 2011-04-06 | Disposition: A | Discharge status: Home / Self Care | Payer: Medicare HMO | Source: Ambulatory Visit | Attending: Obstetrics and Gynecology | Admitting: Obstetrics and Gynecology

## 2011-04-06 DIAGNOSIS — Z1231 Encounter for screening mammogram for malignant neoplasm of breast: Secondary | ICD-10-CM | POA: Insufficient documentation

## 2011-04-06 DIAGNOSIS — Z139 Encounter for screening, unspecified: Secondary | ICD-10-CM

## 2011-11-01 ENCOUNTER — Other Ambulatory Visit (HOSPITAL_COMMUNITY): Payer: Self-pay | Admitting: Pulmonary Disease

## 2011-11-01 DIAGNOSIS — M810 Age-related osteoporosis without current pathological fracture: Secondary | ICD-10-CM

## 2011-11-08 ENCOUNTER — Other Ambulatory Visit (HOSPITAL_COMMUNITY): Payer: Medicare HMO

## 2011-11-09 ENCOUNTER — Ambulatory Visit (HOSPITAL_COMMUNITY)
Admission: RE | Admit: 2011-11-09 | Discharge: 2011-11-09 | Disposition: A | Discharge status: Home / Self Care | Payer: Medicare HMO | Source: Ambulatory Visit | Attending: Pulmonary Disease | Admitting: Pulmonary Disease

## 2011-11-09 DIAGNOSIS — M810 Age-related osteoporosis without current pathological fracture: Secondary | ICD-10-CM

## 2011-11-09 DIAGNOSIS — M818 Other osteoporosis without current pathological fracture: Secondary | ICD-10-CM | POA: Insufficient documentation

## 2012-03-07 ENCOUNTER — Other Ambulatory Visit: Payer: Self-pay | Admitting: Neurosurgery

## 2012-03-07 DIAGNOSIS — M542 Cervicalgia: Secondary | ICD-10-CM

## 2012-03-14 ENCOUNTER — Ambulatory Visit
Admission: RE | Admit: 2012-03-14 | Discharge: 2012-03-14 | Disposition: A | Discharge status: Home / Self Care | Payer: Medicare Other | Source: Ambulatory Visit | Attending: Neurosurgery | Admitting: Neurosurgery

## 2012-03-14 ENCOUNTER — Ambulatory Visit
Admission: RE | Admit: 2012-03-14 | Discharge: 2012-03-14 | Disposition: A | Discharge status: Home / Self Care | Payer: No Typology Code available for payment source | Source: Ambulatory Visit | Attending: Neurosurgery | Admitting: Neurosurgery

## 2012-03-14 VITALS — BP 138/74 | HR 98

## 2012-03-14 DIAGNOSIS — M542 Cervicalgia: Secondary | ICD-10-CM

## 2012-03-14 MED ORDER — IOHEXOL 300 MG/ML  SOLN
10.0000 mL | Freq: Once | INTRAMUSCULAR | Status: AC | PRN
  Administered 2012-03-14: 10 mL via INTRATHECAL

## 2012-03-14 MED ORDER — KETOROLAC TROMETHAMINE 60 MG/2ML IM SOLN
30.0000 mg | Freq: Once | INTRAMUSCULAR | Status: AC
  Administered 2012-03-14: 30 mg via INTRAMUSCULAR

## 2012-03-14 MED ORDER — DIAZEPAM 5 MG PO TABS
5.0000 mg | ORAL_TABLET | Freq: Once | ORAL | Status: AC
  Administered 2012-03-14: 5 mg via ORAL

## 2012-03-14 NOTE — Progress Notes (Signed)
Patient states last dose of Cymbalta was "day before yesterday."  jkl

## 2012-03-21 ENCOUNTER — Other Ambulatory Visit: Payer: Self-pay | Admitting: Neurosurgery

## 2012-03-25 ENCOUNTER — Encounter (HOSPITAL_COMMUNITY): Payer: Self-pay | Admitting: Respiratory Therapy

## 2012-03-29 ENCOUNTER — Encounter (HOSPITAL_COMMUNITY)
Admission: RE | Admit: 2012-03-29 | Discharge: 2012-03-29 | Disposition: A | Discharge status: Home / Self Care | Payer: Medicare Other | Source: Ambulatory Visit | Attending: Neurosurgery | Admitting: Neurosurgery

## 2012-03-29 ENCOUNTER — Encounter (HOSPITAL_COMMUNITY): Payer: Self-pay

## 2012-03-29 ENCOUNTER — Ambulatory Visit (HOSPITAL_COMMUNITY)
Admission: RE | Admit: 2012-03-29 | Discharge: 2012-03-29 | Disposition: A | Discharge status: Home / Self Care | Payer: Medicare Other | Source: Ambulatory Visit | Attending: Anesthesiology | Admitting: Anesthesiology

## 2012-03-29 HISTORY — DX: Depression, unspecified: F32.A

## 2012-03-29 HISTORY — DX: Chronic obstructive pulmonary disease, unspecified: J44.9

## 2012-03-29 HISTORY — DX: Hypothyroidism, unspecified: E03.9

## 2012-03-29 HISTORY — DX: Peripheral vascular disease, unspecified: I73.9

## 2012-03-29 HISTORY — DX: Major depressive disorder, single episode, unspecified: F32.9

## 2012-03-29 HISTORY — DX: Drug induced constipation: K59.03

## 2012-03-29 HISTORY — DX: Unspecified osteoarthritis, unspecified site: M19.90

## 2012-03-29 HISTORY — DX: Gastro-esophageal reflux disease without esophagitis: K21.9

## 2012-03-29 LAB — BASIC METABOLIC PANEL
Chloride: 98 mEq/L (ref 96–112)
GFR calc Af Amer: >90 mL/min (ref >90)
Potassium: 3.6 mEq/L (ref 3.5–5.1)

## 2012-03-29 LAB — CBC
HCT: 35.2 % — ABNORMAL LOW (ref 36.0–46.0)
Hemoglobin: 11.6 g/dL — ABNORMAL LOW (ref 12.0–15.0)
RDW: 14.5 % (ref 11.5–15.5)
WBC: 6.7 10*3/uL (ref 4.0–10.5)

## 2012-03-29 LAB — SURGICAL PCR SCREEN
MRSA, PCR: NEGATIVE
Staphylococcus aureus: NEGATIVE

## 2012-03-29 NOTE — Pre-Procedure Instructions (Signed)
[name]  03/29/2012   Your procedure is scheduled on:  Wednesday, February 12  Report to [facility] at 0630 AM.  Call this number if you have problems the morning of surgery: [phone]   Remember:   Do not eat food or drink liquids after midnight.Tuesday night   Take these medicines the morning of surgery with A SIP OF WATER: Cardizem (diltiazem)Cymbalta,Synthroid,Omeprazole   Do not wear jewelry, make-up or nail polish.  Do not wear lotions, powders, or perfumes. You may wear deodorant.  Do not shave 48 hours prior to surgery. Men may shave face and neck.  Do not bring valuables to the hospital.  Contacts, dentures or bridgework may not be worn into surgery.  Leave suitcase in the car. After surgery it may be brought to your room.  For patients admitted to the hospital, checkout time is 11:00 AM the day of  discharge.   Patients discharged the day of surgery will not be allowed to drive  home.  Name and phone number of your driver:   Special Instructions: Shower using CHG 2 nights before surgery and the night before surgery.  If you shower the day of surgery use CHG.  Use special wash - you have one bottle of CHG for all showers.  You should use approximately 1/3 of the bottle for each shower.   Please read over the following fact sheets that you were given: Pain Booklet, Coughing and Deep Breathing, MRSA Information and Surgical Site Infection Prevention

## 2012-04-02 MED ORDER — CEFAZOLIN SODIUM-DEXTROSE 2-3 GM-% IV SOLR
2.0000 g | INTRAVENOUS | Status: AC
  Administered 2012-04-03: 2 g via INTRAVENOUS
  Filled 2012-04-02: qty 50

## 2012-04-03 ENCOUNTER — Encounter (HOSPITAL_COMMUNITY): Payer: Self-pay | Admitting: *Deleted

## 2012-04-03 ENCOUNTER — Encounter (HOSPITAL_COMMUNITY): Payer: Self-pay | Admitting: Certified Registered Nurse Anesthetist

## 2012-04-03 ENCOUNTER — Observation Stay (HOSPITAL_COMMUNITY): Payer: Medicare Other | Admitting: Certified Registered Nurse Anesthetist

## 2012-04-03 ENCOUNTER — Observation Stay (HOSPITAL_COMMUNITY): Payer: Medicare Other

## 2012-04-03 ENCOUNTER — Inpatient Hospital Stay (HOSPITAL_COMMUNITY)
Admission: RE | Admit: 2012-04-03 | Discharge: 2012-04-04 | DRG: 472 | Disposition: A | Discharge status: Home / Self Care | Payer: Medicare Other | Source: Ambulatory Visit | Attending: Neurosurgery | Admitting: Neurosurgery

## 2012-04-03 ENCOUNTER — Encounter (HOSPITAL_COMMUNITY)
Admission: RE | Disposition: A | Discharge status: Home / Self Care | Payer: Self-pay | Source: Ambulatory Visit | Attending: Neurosurgery

## 2012-04-03 DIAGNOSIS — F17200 Nicotine dependence, unspecified, uncomplicated: Secondary | ICD-10-CM | POA: Diagnosis present

## 2012-04-03 DIAGNOSIS — K219 Gastro-esophageal reflux disease without esophagitis: Secondary | ICD-10-CM | POA: Diagnosis present

## 2012-04-03 DIAGNOSIS — J4489 Other specified chronic obstructive pulmonary disease: Secondary | ICD-10-CM | POA: Diagnosis present

## 2012-04-03 DIAGNOSIS — I739 Peripheral vascular disease, unspecified: Secondary | ICD-10-CM | POA: Diagnosis present

## 2012-04-03 DIAGNOSIS — F3289 Other specified depressive episodes: Secondary | ICD-10-CM | POA: Diagnosis present

## 2012-04-03 DIAGNOSIS — Z79899 Other long term (current) drug therapy: Secondary | ICD-10-CM

## 2012-04-03 DIAGNOSIS — M5000 Cervical disc disorder with myelopathy, unspecified cervical region: Secondary | ICD-10-CM | POA: Diagnosis present

## 2012-04-03 DIAGNOSIS — I10 Essential (primary) hypertension: Secondary | ICD-10-CM | POA: Diagnosis present

## 2012-04-03 DIAGNOSIS — J449 Chronic obstructive pulmonary disease, unspecified: Secondary | ICD-10-CM | POA: Diagnosis present

## 2012-04-03 DIAGNOSIS — Z96659 Presence of unspecified artificial knee joint: Secondary | ICD-10-CM

## 2012-04-03 DIAGNOSIS — M4712 Other spondylosis with myelopathy, cervical region: Principal | ICD-10-CM | POA: Diagnosis present

## 2012-04-03 DIAGNOSIS — F329 Major depressive disorder, single episode, unspecified: Secondary | ICD-10-CM | POA: Diagnosis present

## 2012-04-03 DIAGNOSIS — Z7982 Long term (current) use of aspirin: Secondary | ICD-10-CM

## 2012-04-03 DIAGNOSIS — E039 Hypothyroidism, unspecified: Secondary | ICD-10-CM | POA: Diagnosis present

## 2012-04-03 HISTORY — PX: ANTERIOR CERVICAL DECOMP/DISCECTOMY FUSION: SHX1161

## 2012-04-03 SURGERY — ANTERIOR CERVICAL DECOMPRESSION/DISCECTOMY FUSION 2 LEVELS
Anesthesia: General | Site: Spine Cervical | Wound class: Clean

## 2012-04-03 MED ORDER — KETOROLAC TROMETHAMINE 30 MG/ML IJ SOLN
15.0000 mg | Freq: Four times a day (QID) | INTRAMUSCULAR | Status: DC
  Administered 2012-04-03: 15 mg via INTRAVENOUS
  Administered 2012-04-03: 18:00:00 via INTRAVENOUS
  Administered 2012-04-04: 15 mg via INTRAVENOUS
  Filled 2012-04-03 (×7): qty 1

## 2012-04-03 MED ORDER — KETOROLAC TROMETHAMINE 30 MG/ML IJ SOLN
INTRAMUSCULAR | Status: AC
  Filled 2012-04-03: qty 1

## 2012-04-03 MED ORDER — HYDROXYZINE HCL 50 MG/ML IM SOLN: 50.0000 mg | INTRAMUSCULAR | Status: DC | PRN

## 2012-04-03 MED ORDER — MEPERIDINE HCL 25 MG/ML IJ SOLN: 6.2500 mg | INTRAMUSCULAR | Status: DC | PRN

## 2012-04-03 MED ORDER — MIDAZOLAM HCL 2 MG/2ML IJ SOLN: 0.5000 mg | Freq: Once | INTRAMUSCULAR | Status: DC | PRN

## 2012-04-03 MED ORDER — ROCURONIUM BROMIDE 100 MG/10ML IV SOLN
INTRAVENOUS | Status: DC | PRN
  Administered 2012-04-03: 40 mg via INTRAVENOUS

## 2012-04-03 MED ORDER — FENTANYL CITRATE 0.05 MG/ML IJ SOLN
INTRAMUSCULAR | Status: DC | PRN
  Administered 2012-04-03: 50 ug via INTRAVENOUS
  Administered 2012-04-03: 250 ug via INTRAVENOUS
  Administered 2012-04-03: 50 ug via INTRAVENOUS

## 2012-04-03 MED ORDER — OXYCODONE HCL 5 MG PO TABS: 5.0000 mg | ORAL_TABLET | Freq: Once | ORAL | Status: DC | PRN

## 2012-04-03 MED ORDER — SODIUM CHLORIDE 0.9 % IJ SOLN: 3.0000 mL | INTRAMUSCULAR | Status: DC | PRN

## 2012-04-03 MED ORDER — ALUM & MAG HYDROXIDE-SIMETH 200-200-20 MG/5ML PO SUSP: 30.0000 mL | Freq: Four times a day (QID) | ORAL | Status: DC | PRN

## 2012-04-03 MED ORDER — BACITRACIN 50000 UNITS IM SOLR
INTRAMUSCULAR | Status: AC
  Filled 2012-04-03: qty 1

## 2012-04-03 MED ORDER — LEVOTHYROXINE SODIUM 88 MCG PO TABS
88.0000 ug | ORAL_TABLET | Freq: Every day | ORAL | Status: DC
  Administered 2012-04-04: 88 ug via ORAL
  Filled 2012-04-03 (×2): qty 1

## 2012-04-03 MED ORDER — GLYCOPYRROLATE 0.2 MG/ML IJ SOLN
INTRAMUSCULAR | Status: DC | PRN
Start: 2012-04-03 — End: 2012-04-03
  Administered 2012-04-03: .4 mg via INTRAVENOUS

## 2012-04-03 MED ORDER — PHENOL 1.4 % MT LIQD: 1.0000 | OROMUCOSAL | Status: DC | PRN

## 2012-04-03 MED ORDER — PANTOPRAZOLE SODIUM 40 MG PO TBEC
40.0000 mg | DELAYED_RELEASE_TABLET | Freq: Every day | ORAL | Status: DC
  Administered 2012-04-04: 40 mg via ORAL
  Filled 2012-04-03: qty 1

## 2012-04-03 MED ORDER — BISACODYL 10 MG RE SUPP: 10.0000 mg | Freq: Every day | RECTAL | Status: DC | PRN

## 2012-04-03 MED ORDER — ACETAMINOPHEN 10 MG/ML IV SOLN
1000.0000 mg | Freq: Four times a day (QID) | INTRAVENOUS | Status: DC
  Administered 2012-04-03 – 2012-04-04 (×3): 1000 mg via INTRAVENOUS
  Filled 2012-04-03 (×4): qty 100

## 2012-04-03 MED ORDER — PROMETHAZINE HCL 25 MG/ML IJ SOLN
6.2500 mg | INTRAMUSCULAR | Status: DC | PRN
  Administered 2012-04-03: 6.25 mg via INTRAVENOUS

## 2012-04-03 MED ORDER — DILTIAZEM HCL ER COATED BEADS 120 MG PO CP24
120.0000 mg | ORAL_CAPSULE | Freq: Every evening | ORAL | Status: DC
  Filled 2012-04-03: qty 1

## 2012-04-03 MED ORDER — LIDOCAINE-EPINEPHRINE 1 %-1:100000 IJ SOLN
INTRAMUSCULAR | Status: DC | PRN
  Administered 2012-04-03: 5 mL

## 2012-04-03 MED ORDER — SODIUM CHLORIDE 0.9 % IR SOLN
Status: DC | PRN
  Administered 2012-04-03: 11:00:00

## 2012-04-03 MED ORDER — DULOXETINE HCL 60 MG PO CPEP
60.0000 mg | ORAL_CAPSULE | Freq: Every day | ORAL | Status: DC
  Administered 2012-04-04: 60 mg via ORAL
  Filled 2012-04-03: qty 1

## 2012-04-03 MED ORDER — OXYCODONE HCL 5 MG PO TABS: 5.0000 mg | ORAL_TABLET | ORAL | Status: DC | PRN

## 2012-04-03 MED ORDER — PROPOFOL 10 MG/ML IV BOLUS
INTRAVENOUS | Status: DC | PRN
  Administered 2012-04-03: 90 mg via INTRAVENOUS

## 2012-04-03 MED ORDER — ACETAMINOPHEN 10 MG/ML IV SOLN
INTRAVENOUS | Status: AC
  Administered 2012-04-03: 1000 mg via INTRAVENOUS
  Filled 2012-04-03: qty 100

## 2012-04-03 MED ORDER — MAGNESIUM HYDROXIDE 400 MG/5ML PO SUSP: 30.0000 mL | Freq: Every day | ORAL | Status: DC | PRN

## 2012-04-03 MED ORDER — SODIUM CHLORIDE 0.9 % IJ SOLN
3.0000 mL | Freq: Two times a day (BID) | INTRAMUSCULAR | Status: DC
  Administered 2012-04-04: 3 mL via INTRAVENOUS

## 2012-04-03 MED ORDER — SODIUM CHLORIDE 0.9 % IV SOLN: 250.0000 mL | INTRAVENOUS | Status: DC

## 2012-04-03 MED ORDER — SURGIFOAM 100 EX MISC
CUTANEOUS | Status: DC | PRN
  Administered 2012-04-03: 10:00:00 via TOPICAL

## 2012-04-03 MED ORDER — SODIUM CHLORIDE 0.9 % IV SOLN
INTRAVENOUS | Status: AC
  Filled 2012-04-03: qty 500

## 2012-04-03 MED ORDER — KCL IN DEXTROSE-NACL 20-5-0.45 MEQ/L-%-% IV SOLN
INTRAVENOUS | Status: DC
  Filled 2012-04-03 (×5): qty 1000

## 2012-04-03 MED ORDER — ZOLPIDEM TARTRATE 5 MG PO TABS: 5.0000 mg | ORAL_TABLET | Freq: Every evening | ORAL | Status: DC | PRN

## 2012-04-03 MED ORDER — BUPIVACAINE HCL (PF) 0.25 % IJ SOLN
INTRAMUSCULAR | Status: DC | PRN
  Administered 2012-04-03: 5 mL

## 2012-04-03 MED ORDER — NEOSTIGMINE METHYLSULFATE 1 MG/ML IJ SOLN
INTRAMUSCULAR | Status: DC | PRN
  Administered 2012-04-03: 3 mg via INTRAVENOUS

## 2012-04-03 MED ORDER — ACETAMINOPHEN 650 MG RE SUPP: 650.0000 mg | RECTAL | Status: DC | PRN

## 2012-04-03 MED ORDER — PROMETHAZINE HCL 25 MG/ML IJ SOLN
INTRAMUSCULAR | Status: AC
  Filled 2012-04-03: qty 1

## 2012-04-03 MED ORDER — KETOROLAC TROMETHAMINE 30 MG/ML IJ SOLN
15.0000 mg | Freq: Once | INTRAMUSCULAR | Status: AC
  Administered 2012-04-03: 30 mg via INTRAVENOUS

## 2012-04-03 MED ORDER — DEXAMETHASONE SODIUM PHOSPHATE 4 MG/ML IJ SOLN
INTRAMUSCULAR | Status: DC | PRN
  Administered 2012-04-03: 8 mg via INTRAVENOUS

## 2012-04-03 MED ORDER — OXYCODONE HCL 5 MG/5ML PO SOLN: 5.0000 mg | Freq: Once | ORAL | Status: DC | PRN

## 2012-04-03 MED ORDER — LACTATED RINGERS IV SOLN
INTRAVENOUS | Status: DC | PRN
  Administered 2012-04-03 (×3): via INTRAVENOUS

## 2012-04-03 MED ORDER — ACETAMINOPHEN 325 MG PO TABS: 650.0000 mg | ORAL_TABLET | ORAL | Status: DC | PRN

## 2012-04-03 MED ORDER — PHENYLEPHRINE HCL 10 MG/ML IJ SOLN
INTRAMUSCULAR | Status: DC | PRN
  Administered 2012-04-03 (×3): 80 ug via INTRAVENOUS

## 2012-04-03 MED ORDER — SODIUM CHLORIDE 0.9 % IV SOLN
10.0000 mg | INTRAVENOUS | Status: DC | PRN
  Administered 2012-04-03: 40 ug/min via INTRAVENOUS

## 2012-04-03 MED ORDER — EPHEDRINE SULFATE 50 MG/ML IJ SOLN
INTRAMUSCULAR | Status: DC | PRN
  Administered 2012-04-03: 20 mg via INTRAVENOUS
  Administered 2012-04-03: 15 mg via INTRAVENOUS

## 2012-04-03 MED ORDER — HYDROMORPHONE HCL PF 1 MG/ML IJ SOLN: 0.2500 mg | INTRAMUSCULAR | Status: DC | PRN

## 2012-04-03 MED ORDER — HYDROXYZINE HCL 25 MG PO TABS: 50.0000 mg | ORAL_TABLET | ORAL | Status: DC | PRN

## 2012-04-03 MED ORDER — 0.9 % SODIUM CHLORIDE (POUR BTL) OPTIME
TOPICAL | Status: DC | PRN
  Administered 2012-04-03: 1000 mL

## 2012-04-03 MED ORDER — CYCLOBENZAPRINE HCL 10 MG PO TABS: 5.0000 mg | ORAL_TABLET | Freq: Three times a day (TID) | ORAL | Status: DC | PRN

## 2012-04-03 MED ORDER — MENTHOL 3 MG MT LOZG: 1.0000 | LOZENGE | OROMUCOSAL | Status: DC | PRN

## 2012-04-03 MED ORDER — LIDOCAINE HCL (CARDIAC) 20 MG/ML IV SOLN
INTRAVENOUS | Status: DC | PRN
  Administered 2012-04-03: 20 mg via INTRAVENOUS

## 2012-04-03 MED ORDER — SCOPOLAMINE 1 MG/3DAYS TD PT72
MEDICATED_PATCH | TRANSDERMAL | Status: AC
  Filled 2012-04-03: qty 1

## 2012-04-03 MED ORDER — ONDANSETRON HCL 4 MG/2ML IJ SOLN
INTRAMUSCULAR | Status: DC | PRN
  Administered 2012-04-03: 4 mg via INTRAVENOUS

## 2012-04-03 MED ORDER — ATORVASTATIN CALCIUM 20 MG PO TABS
20.0000 mg | ORAL_TABLET | Freq: Every day | ORAL | Status: DC
  Filled 2012-04-03 (×2): qty 1

## 2012-04-03 SURGICAL SUPPLY — 62 items
ADH SKN CLS APL DERMABOND .7 (GAUZE/BANDAGES/DRESSINGS)
ADH SKN CLS LQ APL DERMABOND (GAUZE/BANDAGES/DRESSINGS) ×1
ALLOGRAFT CA 6X14X11 (Bone Implant) ×2 IMPLANT
BAG DECANTER FOR FLEXI CONT (MISCELLANEOUS) ×2 IMPLANT
BIT DRILL NEURO 2X3.1 SFT TUCH (MISCELLANEOUS) ×1 IMPLANT
BLADE ULTRA TIP 2M (BLADE) ×2 IMPLANT
BRUSH SCRUB EZ PLAIN DRY (MISCELLANEOUS) ×2 IMPLANT
CANISTER SUCTION 2500CC (MISCELLANEOUS) ×2 IMPLANT
CLOTH BEACON ORANGE TIMEOUT ST (SAFETY) ×2 IMPLANT
CONT SPEC 4OZ CLIKSEAL STRL BL (MISCELLANEOUS) ×2 IMPLANT
COVER MAYO STAND STRL (DRAPES) ×2 IMPLANT
DECANTER SPIKE VIAL GLASS SM (MISCELLANEOUS) ×2 IMPLANT
DERMABOND ADHESIVE PROPEN (GAUZE/BANDAGES/DRESSINGS) ×1
DERMABOND ADVANCED (GAUZE/BANDAGES/DRESSINGS)
DERMABOND ADVANCED .7 DNX12 (GAUZE/BANDAGES/DRESSINGS) ×1 IMPLANT
DERMABOND ADVANCED .7 DNX6 (GAUZE/BANDAGES/DRESSINGS) IMPLANT
DRAPE LAPAROTOMY 100X72 PEDS (DRAPES) ×2 IMPLANT
DRAPE MICROSCOPE LEICA (MISCELLANEOUS) ×2 IMPLANT
DRAPE POUCH INSTRU U-SHP 10X18 (DRAPES) ×2 IMPLANT
DRAPE PROXIMA HALF (DRAPES) IMPLANT
DRILL NEURO 2X3.1 SOFT TOUCH (MISCELLANEOUS) ×2
ELECT COATED BLADE 2.86 ST (ELECTRODE) ×2 IMPLANT
ELECT REM PT RETURN 9FT ADLT (ELECTROSURGICAL) ×2
ELECTRODE REM PT RTRN 9FT ADLT (ELECTROSURGICAL) ×1 IMPLANT
GLOVE BIO SURGEON STRL SZ8 (GLOVE) ×1 IMPLANT
GLOVE BIOGEL PI IND STRL 6.5 (GLOVE) IMPLANT
GLOVE BIOGEL PI IND STRL 8 (GLOVE) ×1 IMPLANT
GLOVE BIOGEL PI IND STRL 8.5 (GLOVE) IMPLANT
GLOVE BIOGEL PI INDICATOR 6.5 (GLOVE) ×1
GLOVE BIOGEL PI INDICATOR 8 (GLOVE) ×1
GLOVE BIOGEL PI INDICATOR 8.5 (GLOVE) ×1
GLOVE ECLIPSE 6.5 STRL STRAW (GLOVE) ×1 IMPLANT
GLOVE ECLIPSE 7.5 STRL STRAW (GLOVE) ×3 IMPLANT
GLOVE EXAM NITRILE LRG STRL (GLOVE) IMPLANT
GLOVE EXAM NITRILE MD LF STRL (GLOVE) IMPLANT
GLOVE EXAM NITRILE XL STR (GLOVE) IMPLANT
GLOVE EXAM NITRILE XS STR PU (GLOVE) IMPLANT
GLOVE INDICATOR 8.5 STRL (GLOVE) ×1 IMPLANT
GLOVE SURG SS PI 8.0 STRL IVOR (GLOVE) ×2 IMPLANT
GOWN BRE IMP SLV AUR LG STRL (GOWN DISPOSABLE) ×1 IMPLANT
GOWN BRE IMP SLV AUR XL STRL (GOWN DISPOSABLE) ×2 IMPLANT
GOWN STRL REIN 2XL LVL4 (GOWN DISPOSABLE) ×1 IMPLANT
HEAD HALTER (SOFTGOODS) ×2 IMPLANT
KIT BASIN OR (CUSTOM PROCEDURE TRAY) ×2 IMPLANT
KIT ROOM TURNOVER OR (KITS) ×2 IMPLANT
NDL HYPO 25X1 1.5 SAFETY (NEEDLE) ×1 IMPLANT
NDL SPNL 22GX3.5 QUINCKE BK (NEEDLE) ×2 IMPLANT
NEEDLE HYPO 25X1 1.5 SAFETY (NEEDLE) ×2 IMPLANT
NEEDLE SPNL 22GX3.5 QUINCKE BK (NEEDLE) ×4 IMPLANT
NS IRRIG 1000ML POUR BTL (IV SOLUTION) ×2 IMPLANT
PACK LAMINECTOMY NEURO (CUSTOM PROCEDURE TRAY) ×2 IMPLANT
PAD ARMBOARD 7.5X6 YLW CONV (MISCELLANEOUS) ×6 IMPLANT
RUBBERBAND STERILE (MISCELLANEOUS) ×4 IMPLANT
SPONGE INTESTINAL PEANUT (DISPOSABLE) ×2 IMPLANT
SPONGE SURGIFOAM ABS GEL 100 (HEMOSTASIS) ×2 IMPLANT
STAPLER SKIN PROX WIDE 3.9 (STAPLE) IMPLANT
SUT VIC AB 2-0 CP2 18 (SUTURE) ×2 IMPLANT
SUT VIC AB 3-0 SH 8-18 (SUTURE) ×2 IMPLANT
SYR 20ML ECCENTRIC (SYRINGE) ×2 IMPLANT
TOWEL OR 17X24 6PK STRL BLUE (TOWEL DISPOSABLE) ×2 IMPLANT
TOWEL OR 17X26 10 PK STRL BLUE (TOWEL DISPOSABLE) ×2 IMPLANT
WATER STERILE IRR 1000ML POUR (IV SOLUTION) ×2 IMPLANT

## 2012-04-03 NOTE — Anesthesia Procedure Notes (Addendum)
Procedure Name: Intubation Date/Time: 04/03/2012 9:37 AM Performed by: Rogelia Boga Pre-anesthesia Checklist: Patient identified, Emergency Drugs available, Suction available, Patient being monitored and Timeout performed Oxygen Delivery Method: Circle system utilized Preoxygenation: Pre-oxygenation with 100% oxygen Intubation Type: IV induction Ventilation: Mask ventilation without difficulty and Oral airway inserted - appropriate to patient size Laryngoscope Size: Mac and 4 Grade View: Grade I Tube type: Oral Tube size: 7.0 mm Number of attempts: 1 Airway Equipment and Method: Stylet Placement Confirmation: ETT inserted through vocal cords under direct vision,  positive ETCO2 and breath sounds checked- equal and bilateral Secured at: 21 cm Tube secured with: Tape Dental Injury: Teeth and Oropharynx as per pre-operative assessment

## 2012-04-03 NOTE — Op Note (Signed)
04/03/2012  12:04 PM  PATIENT:  [name]  77 y.o. female  PRE-OPERATIVE DIAGNOSIS: C4-5 and C5-6 cervical stenosis cervical herniated disc cervical spondylosis  POST-OPERATIVE DIAGNOSIS: C4-5 and C5-6 cervical stenosis,cervical herniated disc,cervical spondylosis  PROCEDURE:  Procedure(s): ANTERIOR CERVICAL DECOMPRESSION/DISCECTOMY FUSION 2 LEVELS:  C4-5 and C5-6 anterior cervical decompression and arthrodesis with allograft and tether cervical plating  SURGEON:  Surgeon(s): Hewitt Shorts, MD Mariam Dollar, MD  ASSISTANTS:Gary Wynetta Emery, M.D.  ANESTHESIA:   general  EBL:  Total I/O In: 1000 [I.V.:1000] Out: 75 [Blood:75]  BLOOD ADMINISTERED:none  COUNT:  Correct per nursing staff  DICTATION: Patient was brought to the operating room placed under general endotracheal anesthesia. Patient was placed in 10 pounds of halter traction. The neck was prepped with Betadine soap and solution and draped in a sterile fashion. A horizontal incision was made on the left side of the neck. The line of the incision was infiltrated with local anesthetic with epinephrine. Dissection was carried down thru the subcutaneous tissue and platysma, bipolar cautery was used to maintain hemostasis. Dissection was then carried out thru an avascular plane leaving the sternocleidomastoid carotid artery and jugular vein laterally and the trachea and esophagus medially. The ventral aspect of the vertebral column was identified and a localizing x-ray was taken. The C4-5 and C5-6 levels were identified. The annulus at each level was incised and the disc space entered. Discectomy was performed with micro-curettes and pituitary rongeurs. The operating microscope was draped and brought into the field provided additional magnification illumination and visualization. Discectomy was continued posteriorly thru the disc space and then the cartilaginous endplate was removed using micro-curettes along with the high-speed drill.  Posterior osteophytic overgrowth was removed each level using the high-speed drill along with a 2 mm thin footplated Kerrison punch. Posterior longitudinal ligament along with disc herniation was carefully removed, decompressing the spinal canal and thecal sac. We then continued to remove osteophytic overgrowth and disc material decompressing the neural foramina and exiting nerve roots bilaterally. Once the decompression was completed hemostasis was established at each level with the use of Gelfoam with thrombin and bipolar cautery. The Gelfoam was removed the wound irrigated and hemostasis confirmed. We then measured the height of the intravertebral disc space level and selected a 6 millimeter in height structural allograft for the C4-5 level and a 6 millimeter in height structural allograft for the C5-6 level . Each was hydrated and saline solution and then gently positioned in the intravertebral disc space and countersunk. We then selected a 29 millimeter in height Tether cervical plate. It was positioned over the fusion construct and secured to the vertebra with a pair of 4 x 12 mm variable screws at the C4 level, a single 4 x 12 mm fixed screw at the C5 level, and a pair of 4 x 12 mm variable screws at the C6 level. Each screw hole was started with the high-speed drill and then the screws placed, once all the screws were placed final tightening was performed. The wound was irrigated with bacitracin solution checked for hemostasis which was established and confirmed. An x-ray was taken which showed grafts in good position, the plate and screws in good position and the overall alignment to look good. We then proceeded with closure. The platysma was closed with interrupted inverted 2-0 undyed Vicryl suture, the subcutaneous and subcuticular closed with interrupted inverted 3-0 undyed Vicryl suture. The skin edges were approximated with Dermabond. Following surgery the patient was taken out of cervical  traction. To  be reversed and the anesthetic and taken to the recovery room for further care.I will  PLAN OF CARE: Admit to inpatient   PATIENT DISPOSITION:  PACU - hemodynamically stable.   Delay start of Pharmacological VTE agent (>24hrs) due to surgical blood loss or risk of bleeding:  yes

## 2012-04-03 NOTE — Transfer of Care (Signed)
Immediate Anesthesia Transfer of Care Note  Patient: [name]  Procedure(s) Performed: Procedure(s) with comments: ANTERIOR CERVICAL DECOMPRESSION/DISCECTOMY FUSION 2 LEVELS (N/A) - Cervical four-five,Cervical five-six anterior cervical decompression with fusion plating and bonegraft  Patient Location: PACU  Anesthesia Type:General  Level of Consciousness: awake, alert , oriented and patient cooperative  Airway & Oxygen Therapy: Patient Spontanous Breathing and Patient connected to nasal cannula oxygen  Post-op Assessment: Report given to PACU RN, Post -op Vital signs reviewed and stable and Patient moving all extremities X 4  Post vital signs: Reviewed and stable  Complications: No apparent anesthesia complications

## 2012-04-03 NOTE — Preoperative (Signed)
Beta Blockers   Reason not to administer Beta Blockers:Not Applicable 

## 2012-04-03 NOTE — H&P (Signed)
Subjective: Patient is a 77 y.o. female who is admitted for treatment of advanced multilevel cervical spondylosis and DDD, with resulting signigficant canal stenosis and spinal cord compression and flattening at C4-5 and C5-6.  Clinically she has neck and upper back pain with bilateral upper extremity weakness and bilateral numbness in the hands, right worse than left.  She is admitted for a 2 level C4-5 and C5-6 anterior cervical discectomy and arthrodesis.   Patient Active Problem List   Diagnosis Date Noted  . Cervical pain 11/01/2010   Past Medical History  Diagnosis Date  . Arthritis   . Hypertension   . PONV (postoperative nausea and vomiting)     severe n/v after every surgery  . Hypothyroidism   . Hypothyroid   . Peripheral vascular disease   . Osteoarthritis   . GERD (gastroesophageal reflux disease)   . Constipation due to pain medication   . COPD (chronic obstructive pulmonary disease)   . Depression     Past Surgical History  Procedure Laterality Date  . Total knee arthroplasty    . Carotid stent    . Shoulder surgery    . Abdominal hysterectomy    . Cholecystectomy    . Back surgery    . Rotator cuff repair    . Eye surgery      cataract /lens both eyes  . Vascular surgery    . Joint replacement  2012,2011    knees    No prescriptions prior to admission   Allergies  Allergen Reactions  . Aleve (Naproxen Sodium) Hives  . Celebrex (Celecoxib) Nausea And Vomiting  . Codeine Nausea And Vomiting  . Morphine And Related Nausea And Vomiting    History  Substance Use Topics  . Smoking status: Current Every Day Smoker -- 0.50 packs/day for 45 years    Types: Cigarettes  . Smokeless tobacco: Never Used  . Alcohol Use: No    No family history on file.   Review of Systems A comprehensive review of systems was negative.  Objective: Vital signs in last 24 hours:    EXAM: Patient is a well developed, well nourished white female in NAD.   Lungs are clear to  auscultation , the patient has symmetrical respiratory excursion. Heart has a regular rate and rhythm normal S1 and S2 no murmur.   Abdomen is soft nontender nondistended bowel sounds are present. Extremity examination shows no clubbing cyanosis or edema. Musculoskeletal exam shows mild tenderness diffusely in the neck with mild limitation of ROM of the neck. Motor examination shows  the deltoid is 4/5 on the left and 5/5 on the right.  The biceps and triceps 4/5 bilaterally. The intrinsics and grip are 5/5 bilaterally. Sensation is intact to pinprick throughout the digits of the upper extremities. Reflexes are symmetrical and without evidence of pathologic reflexes. Patient has a normal gait and stance.  Data Review:CBC    Component Value Date/Time   WBC 6.7 03/29/2012 1347   RBC 4.06 03/29/2012 1347   HGB 11.6* 03/29/2012 1347   HCT 35.2* 03/29/2012 1347   PLT 488* 03/29/2012 1347   MCV 86.7 03/29/2012 1347   MCH 28.6 03/29/2012 1347   MCHC 33.0 03/29/2012 1347   RDW 14.5 03/29/2012 1347   LYMPHSABS 2.3 10/21/2010 1940   MONOABS 0.6 10/21/2010 1940   EOSABS 0.2 10/21/2010 1940   BASOSABS 0.0 10/21/2010 1940  BMET    Component Value Date/Time   NA 135 03/29/2012 1347   K 3.6 03/29/2012 1347   CL 98 03/29/2012 1347   CO2 26 03/29/2012 1347   GLUCOSE 94 03/29/2012 1347   BUN 16 03/29/2012 1347   CREATININE 0.50 03/29/2012 1347   CALCIUM 8.9 03/29/2012 1347   GFRNONAA >90 03/29/2012 1347   GFRAA >90 03/29/2012 1347     Assessment/Plan: Patient with neck and upper back pain with bilateral upper extremity weakness and numbness secondary to advanced arthritic degeneration with resulting spinal stenosis and spinal cord compression who is admitted for a 2 level C4-5 and C5-6 ACDF.  I've discussed with the patient the nature of his condition, the nature the surgical procedure, the typical length of surgery, hospital stay, and overall recuperation. We discussed limitations postoperatively. I discussed  risks of surgery including risks of infection, bleeding, possibly need for transfusion, the risk of nerve root dysfunction with pain, weakness, numbness, or paresthesias, the risk of spinal cord dysfunction with paralysis of all 4 limbs and quadriplegia, and the risk of dural tear and CSF leakage and possible need for further surgery, the risk of esophageal dysfunction causing dysphagia and the risk of laryngeal dysfunction causing hoarseness of the voice, the risk of failure of the arthrodesis and the possible need for further surgery, and the risk of anesthetic complications including myocardial infarction, stroke, pneumonia, and death. We also discussed the need for postoperative immobilization in a cervical collar. Understanding all this the patient does wish to proceed with surgery and is admitted for such.   Hewitt Shorts, MD 04/03/2012 5:54 AM

## 2012-04-03 NOTE — Anesthesia Postprocedure Evaluation (Signed)
  Anesthesia Post-op Note  Patient: [name]  Procedure(s) Performed: Procedure(s) with comments: ANTERIOR CERVICAL DECOMPRESSION/DISCECTOMY FUSION 2 LEVELS (N/A) - Cervical four-five,Cervical five-six anterior cervical decompression with fusion plating and bonegraft  Patient Location: PACU  Anesthesia Type:General  Level of Consciousness: awake, alert , oriented and patient cooperative  Airway and Oxygen Therapy: Patient Spontanous Breathing and Patient connected to nasal cannula oxygen  Post-op Pain: mild  Post-op Assessment: Post-op Vital signs reviewed, Patient's Cardiovascular Status Stable, Respiratory Function Stable, Patent Airway and Pain level controlled, nausea improved  Post-op Vital Signs: Reviewed and stable  Complications: No apparent anesthesia complications

## 2012-04-03 NOTE — Anesthesia Preprocedure Evaluation (Addendum)
Anesthesia Evaluation  Patient identified by MRN, date of birth, ID band Patient awake    Reviewed: Allergy & Precautions, H&P , NPO status , Patient's Chart, lab work & pertinent test results  History of Anesthesia Complications (+) PONV  Airway Mallampati: I TM Distance: >3 FB Neck ROM: Full    Dental  (+) Edentulous Upper, Partial Lower and Dental Advisory Given   Pulmonary COPDCurrent Smoker,  breath sounds clear to auscultation  Pulmonary exam normal       Cardiovascular hypertension, Pt. on medications + Peripheral Vascular Disease (s/p CEA) Rhythm:Regular Rate:Normal     Neuro/Psych Chronic neck and back pain: advil TIA (now s/p CEA)   GI/Hepatic Neg liver ROS, GERD-  Medicated and Controlled,  Endo/Other  Hypothyroidism   Renal/GU negative Renal ROS     Musculoskeletal   Abdominal   Peds  Hematology   Anesthesia Other Findings   Reproductive/Obstetrics                          Anesthesia Physical Anesthesia Plan  ASA: III  Anesthesia Plan: General   Post-op Pain Management:    Induction: Intravenous  Airway Management Planned: Oral ETT  Additional Equipment:   Intra-op Plan:   Post-operative Plan: Extubation in OR  Informed Consent: I have reviewed the patients History and Physical, chart, labs and discussed the procedure including the risks, benefits and alternatives for the proposed anesthesia with the patient or authorized representative who has indicated his/her understanding and acceptance.   Dental advisory given  Plan Discussed with: CRNA, Surgeon and Anesthesiologist  Anesthesia Plan Comments: (Plan routine monitors, GETA)       Anesthesia Quick Evaluation

## 2012-04-04 NOTE — Progress Notes (Signed)
Subjective: Patient reports Doing well he still numbness in her hands but pain her arms is better her swallowing is manageable  Objective: Vital signs in last 24 hours: Temp:  [97 F (36.1 C)-99.2 F (37.3 C)] 98.4 F (36.9 C) (02/13 0400) Pulse Rate:  [79-107] 107 (02/13 0400) Resp:  [18-22] 18 (02/13 0400) BP: (107-194)/(43-87) 156/66 mmHg (02/13 0400) SpO2:  [92 %-97 %] 92 % (02/13 0400)  Intake/Output from previous day: 02/12 0701 - 02/13 0700 In: 2480 [P.O.:480; I.V.:2000] Out: 75 [Blood:75] Intake/Output this shift:    Strength is 5 out of 5 wound is clean and dry  Lab Results: No results found for this basename: WBC, HGB, HCT, PLT,  in the last 72 hours BMET No results found for this basename: NA, K, CL, CO2, GLUCOSE, BUN, CREATININE, CALCIUM,  in the last 72 hours  Studies/Results: Dg Cervical Spine 2-3 Views  04/03/2012  *RADIOLOGY REPORT*  Clinical Data: Neck pain  CERVICAL SPINE - 2-3 VIEW  Comparison: Multiple priors.  Findings: Film #1 demonstrates a needle at C5-C6.  Film #2 demonstrates ACDF C4-C6.  Improved position and alignment.  IMPRESSION: As above.   Original Report Authenticated By: Davonna Belling, M.D.     Assessment/Plan: Discharged today when the patient was mobilized more and tolerating regular  LOS: 1 day     Hanaa Payes P 04/04/2012, 7:42 AM

## 2012-04-04 NOTE — Discharge Summary (Signed)
  Physician Discharge Summary  Patient ID: [name] MRN: [id number] DOB/AGE: 75-13-1937 75 y.o.  Admit date: 04/03/2012 Discharge date: 04/04/2012  Admission Diagnoses: Cervical spondylosis with stenosis and myelopathy  Discharge Diagnoses: Same Active Problems:   * No active hospital problems. *   Discharged Condition: good  Hospital Course: Patient is admitted hospital underwent anterior cervical discectomy and fusion postoperatively are well with recovered in the floor on the floor patient was ambulating and voiding tolerating a soft mechanical diet and was stable and be discharged on postop day 1 if she feels it didn't safely because of the weather.  Consults: Significant Diagnostic Studies: Treatments: ACDF Discharge Exam: Blood pressure 156/66, pulse 107, temperature 98.4 F (36.9 C), temperature source Oral, resp. rate 18, SpO2 92.00%. Strength out of 5 wound clean dry and  Disposition: Home     Medication List    TAKE these medications       aspirin EC 81 MG tablet  Take 81 mg by mouth daily.     BIOTIN PO  Take 1 tablet by mouth daily.     CALCIUM-VITAMIN D PO  Take 1 tablet by mouth 3 (three) times daily. Calcium 1200mg  + Vitamin D (unknown strength)     cholecalciferol 1000 UNITS tablet  Commonly known as:  VITAMIN D  Take 1,000 Units by mouth daily.     denosumab 60 MG/ML Soln injection  Commonly known as:  PROLIA  Inject 60 mg into the skin every 6 (six) months. Administer in upper arm, thigh, or abdomen     diltiazem 120 MG 24 hr capsule  Commonly known as:  CARDIZEM CD  Take 120 mg by mouth every evening.     DULoxetine 60 MG capsule  Commonly known as:  CYMBALTA  Take 60 mg by mouth daily.     Fish Oil 1000 MG Caps  Take 1 capsule by mouth 2 (two) times daily.     Flax Seed Oil 1000 MG Caps  Take 1 capsule by mouth 2 (two) times daily.     levothyroxine 88 MCG tablet  Commonly known as:  SYNTHROID, LEVOTHROID  Take 88 mcg by  mouth daily.     multivitamin with minerals Tabs  Take 1 tablet by mouth daily.     omeprazole 20 MG capsule  Commonly known as:  PRILOSEC  Take 20 mg by mouth every other day.     rosuvastatin 10 MG tablet  Commonly known as:  CRESTOR  Take 10 mg by mouth daily.     vitamin C 1000 MG tablet  Take 1,000 mg by mouth 2 (two) times daily.         Signed: Shirlie Enck P 04/04/2012, 7:43 AM

## 2012-04-08 ENCOUNTER — Encounter (HOSPITAL_COMMUNITY): Payer: Self-pay | Admitting: Neurosurgery

## 2012-04-23 ENCOUNTER — Other Ambulatory Visit (HOSPITAL_COMMUNITY): Payer: Self-pay | Admitting: Neurosurgery

## 2012-04-23 DIAGNOSIS — M5020 Other cervical disc displacement, unspecified cervical region: Secondary | ICD-10-CM

## 2012-04-23 DIAGNOSIS — M47817 Spondylosis without myelopathy or radiculopathy, lumbosacral region: Secondary | ICD-10-CM

## 2012-04-23 DIAGNOSIS — M4802 Spinal stenosis, cervical region: Secondary | ICD-10-CM

## 2012-04-25 ENCOUNTER — Ambulatory Visit (HOSPITAL_COMMUNITY)
Admission: RE | Admit: 2012-04-25 | Discharge: 2012-04-25 | Disposition: A | Discharge status: Home / Self Care | Payer: Medicare Other | Source: Ambulatory Visit | Attending: Neurosurgery | Admitting: Neurosurgery

## 2012-04-25 DIAGNOSIS — M47817 Spondylosis without myelopathy or radiculopathy, lumbosacral region: Secondary | ICD-10-CM

## 2012-04-25 DIAGNOSIS — M545 Low back pain, unspecified: Secondary | ICD-10-CM | POA: Insufficient documentation

## 2012-04-25 DIAGNOSIS — M542 Cervicalgia: Secondary | ICD-10-CM | POA: Insufficient documentation

## 2012-04-25 DIAGNOSIS — Z981 Arthrodesis status: Secondary | ICD-10-CM | POA: Insufficient documentation

## 2012-04-25 DIAGNOSIS — M4802 Spinal stenosis, cervical region: Secondary | ICD-10-CM

## 2012-04-25 DIAGNOSIS — M5020 Other cervical disc displacement, unspecified cervical region: Secondary | ICD-10-CM

## 2012-05-23 ENCOUNTER — Other Ambulatory Visit: Payer: Self-pay | Admitting: Neurosurgery

## 2012-05-23 DIAGNOSIS — M549 Dorsalgia, unspecified: Secondary | ICD-10-CM

## 2012-05-27 ENCOUNTER — Ambulatory Visit
Admission: RE | Admit: 2012-05-27 | Discharge: 2012-05-27 | Disposition: A | Discharge status: Home / Self Care | Payer: Medicare Other | Source: Ambulatory Visit | Attending: Neurosurgery | Admitting: Neurosurgery

## 2012-05-27 VITALS — BP 113/52 | HR 80

## 2012-05-27 DIAGNOSIS — M549 Dorsalgia, unspecified: Secondary | ICD-10-CM

## 2012-05-27 DIAGNOSIS — M542 Cervicalgia: Secondary | ICD-10-CM

## 2012-05-27 MED ORDER — DIAZEPAM 5 MG PO TABS
5.0000 mg | ORAL_TABLET | Freq: Once | ORAL | Status: AC
  Administered 2012-05-27: 5 mg via ORAL

## 2012-05-27 MED ORDER — IOHEXOL 300 MG/ML  SOLN
10.0000 mL | Freq: Once | INTRAMUSCULAR | Status: AC | PRN
  Administered 2012-05-27: 10 mL via INTRATHECAL

## 2012-05-27 MED ORDER — ONDANSETRON HCL 4 MG/2ML IJ SOLN
4.0000 mg | Freq: Once | INTRAMUSCULAR | Status: AC
  Administered 2012-05-27: 4 mg via INTRAMUSCULAR

## 2012-05-27 MED ORDER — MEPERIDINE HCL 100 MG/ML IJ SOLN
75.0000 mg | Freq: Once | INTRAMUSCULAR | Status: AC
  Administered 2012-05-27: 75 mg via INTRAMUSCULAR

## 2012-05-27 NOTE — Progress Notes (Signed)
Patient states she has been off Cymbalta since Friday, 05/24/12.  Discharge instructions explained to patient.  jkl

## 2013-02-20 DIAGNOSIS — I639 Cerebral infarction, unspecified: Secondary | ICD-10-CM

## 2013-02-20 DIAGNOSIS — Z8673 Personal history of transient ischemic attack (TIA), and cerebral infarction without residual deficits: Secondary | ICD-10-CM

## 2013-02-20 HISTORY — DX: Personal history of transient ischemic attack (TIA), and cerebral infarction without residual deficits: Z86.73

## 2013-02-20 HISTORY — PX: PERIPHERAL VASCULAR BALLOON ANGIOPLASTY: CATH118281

## 2013-02-20 HISTORY — DX: Cerebral infarction, unspecified: I63.9

## 2013-02-27 ENCOUNTER — Inpatient Hospital Stay: Payer: Self-pay | Admitting: Vascular Surgery

## 2013-02-27 LAB — CBC WITH DIFFERENTIAL/PLATELET
BASOS PCT: 0.4 %
Basophil #: 0 10*3/uL (ref 0.0–0.1)
Eosinophil #: 0 10*3/uL (ref 0.0–0.7)
Eosinophil %: 0.3 %
HCT: 40.2 % (ref 35.0–47.0)
HGB: 13.9 g/dL (ref 12.0–16.0)
LYMPHS ABS: 0.8 10*3/uL — AB (ref 1.0–3.6)
Lymphocyte %: 13.6 %
MCH: 33.5 pg (ref 26.0–34.0)
MCHC: 34.6 g/dL (ref 32.0–36.0)
MCV: 97 fL (ref 80–100)
Monocyte #: 0.3 x10 3/mm (ref 0.2–0.9)
Monocyte %: 5.4 %
NEUTROS ABS: 4.9 10*3/uL (ref 1.4–6.5)
Neutrophil %: 80.3 %
PLATELETS: 262 10*3/uL (ref 150–440)
RBC: 4.14 10*6/uL (ref 3.80–5.20)
RDW: 13.7 % (ref 11.5–14.5)
WBC: 6.2 10*3/uL (ref 3.6–11.0)

## 2013-02-27 LAB — BASIC METABOLIC PANEL
ANION GAP: 3 — AB (ref 7–16)
BUN: 13 mg/dL (ref 7–18)
CALCIUM: 7.7 mg/dL — AB (ref 8.5–10.1)
Chloride: 104 mmol/L (ref 98–107)
Co2: 29 mmol/L (ref 21–32)
Creatinine: 0.69 mg/dL (ref 0.60–1.30)
EGFR (African American): >60
EGFR (Non-African Amer.): >60
Glucose: 146 mg/dL — ABNORMAL HIGH (ref 65–99)
OSMOLALITY: 275 (ref 275–301)
Potassium: 4.2 mmol/L (ref 3.5–5.1)
SODIUM: 136 mmol/L (ref 136–145)

## 2013-02-27 LAB — CREATININE, SERUM
CREATININE: 0.7 mg/dL (ref 0.60–1.30)
EGFR (African American): >60
EGFR (Non-African Amer.): >60

## 2013-02-27 LAB — BUN: BUN: 15 mg/dL (ref 7–18)

## 2013-02-28 DIAGNOSIS — I369 Nonrheumatic tricuspid valve disorder, unspecified: Secondary | ICD-10-CM

## 2013-02-28 LAB — CBC WITH DIFFERENTIAL/PLATELET
BASOS PCT: 0.3 %
Basophil #: 0 10*3/uL (ref 0.0–0.1)
Eosinophil #: 0 10*3/uL (ref 0.0–0.7)
Eosinophil %: 0.5 %
HCT: 38.8 % (ref 35.0–47.0)
HGB: 13.4 g/dL (ref 12.0–16.0)
Lymphocyte #: 1.3 10*3/uL (ref 1.0–3.6)
Lymphocyte %: 23.3 %
MCH: 33.1 pg (ref 26.0–34.0)
MCHC: 34.5 g/dL (ref 32.0–36.0)
MCV: 96 fL (ref 80–100)
MONOS PCT: 8.1 %
Monocyte #: 0.5 x10 3/mm (ref 0.2–0.9)
Neutrophil #: 3.9 10*3/uL (ref 1.4–6.5)
Neutrophil %: 67.8 %
PLATELETS: 248 10*3/uL (ref 150–440)
RBC: 4.05 10*6/uL (ref 3.80–5.20)
RDW: 13.6 % (ref 11.5–14.5)
WBC: 5.7 10*3/uL (ref 3.6–11.0)

## 2013-02-28 LAB — BASIC METABOLIC PANEL
ANION GAP: 4 — AB (ref 7–16)
BUN: 11 mg/dL (ref 7–18)
CREATININE: 0.65 mg/dL (ref 0.60–1.30)
Calcium, Total: 7.6 mg/dL — ABNORMAL LOW (ref 8.5–10.1)
Chloride: 105 mmol/L (ref 98–107)
Co2: 27 mmol/L (ref 21–32)
EGFR (African American): >60
Glucose: 92 mg/dL (ref 65–99)
OSMOLALITY: 271 (ref 275–301)
POTASSIUM: 3.9 mmol/L (ref 3.5–5.1)
Sodium: 136 mmol/L (ref 136–145)

## 2013-02-28 LAB — LIPID PANEL
Cholesterol: 169 mg/dL (ref 0–200)
HDL: 57 mg/dL (ref 40–60)
Ldl Cholesterol, Calc: 101 mg/dL — ABNORMAL HIGH (ref 0–100)
Triglycerides: 56 mg/dL (ref 0–200)
VLDL Cholesterol, Calc: 11 mg/dL (ref 5–40)

## 2013-02-28 LAB — TSH: Thyroid Stimulating Horm: 0.381 u[IU]/mL — ABNORMAL LOW

## 2013-03-03 ENCOUNTER — Encounter: Payer: Self-pay | Admitting: *Deleted

## 2013-03-03 NOTE — PMR Pre-admission (Shared)
[facility] PMR Admission Coordinator Pre-Admission Assessment  Patient: [name] is an 78 y.o., female MRN: [id number] DOB: [birth year] Height: 5\' 4"  (162.6 cm) Weight: 70.308 kg (155 lb)  Insurance Information HMO:     PPO: yes     PCP:      IPA:      80/20:      OTHER: Medicare advantage Plan PRIMARY: Aetna Medicare      Policy#: [id number]      Subscriber: pt CM Name: Marissa NestleKim Bromwell    Phone#: [phone]     Fax#: [phone] Pre-Cert#: [id number]      Employer: retired approved 1/14 through 1/18 with update due 1/16 Benefits:  Phone #: [phone]     Name: 1/12 Eff. Date: 02/20/13     Deduct: none      Out of Pocket Max: $3950      Life Max: none CIR: $245 copay per day for each day days 1 thru 6      SNF: no copay days 1 thru 20; $140 copay per day days 21-100 Outpatient: $40 copay per visit     Co-Pay: no visit limit Home Health: 100%      Co-Pay: no visit limit DME: 80%     Co-Pay: 20% Providers: in network  SECONDARY: none   Emergency Contact Information Contact Information   Name Relation Home Work [employer]   Ehler,Bob Son [phone]      Current Medical History  Patient Admitting Diagnosis: R MCA CVA  History of Present Illness: [facility]clavian stent placement. Postop presentation for left sided weakness and intermittent expressive aphasia. Felt to be embolic from procedure. Given plavix and [place]. Echo negative for cardiac source.  [facility] has been reviewed by the rehabilitation admission coordinator and physician. NIH Stroke scale: not available Glascow Coma Scale:  Past Medical History  Past Medical History  Diagnosis Date  . Arthritis   . Hypertension   . PONV (postoperative nausea and vomiting)     severe n/v after every surgery  . Hypothyroidism   . Hypothyroid   . Peripheral vascular disease   . Osteoarthritis   . GERD (gastroesophageal reflux  disease)   . Constipation due to pain medication   . COPD (chronic obstructive pulmonary disease)   . Depression     Family History  family history is not on file.  Prior Rehab/Hospitalizations: none   Current Medications ASA Biotin Calcium Cymbalta Diltiazem Fish oil Falx seed oil Multivitamin Niacin Synthroid Vit c Clopidogrel Pravastatin   Patients Current Diet:  Regular diet with thin liquids  Precautions / Restrictions Fall precautions  Prior Activity Level Community (5-7x/wk): [employer] water exercises 4 to 5 times per week; drives self  Home Assistive Devices / Equipment Home Assistive Devices/Equipment: None   Prior Functional Level Current Functional Level  Bed Mobility  Independent  Min assist   Transfers  Independent  Min assist   Mobility - Walk/Wheelchair  Independent  Min assist   Upper Body Dressing  Independent  Min assist   Lower Body Dressing  Independent  Mod assist   Grooming  Independent  Mod assist   Eating/Drinking  Independent  Mod assist   Toilet Transfer  Independent  Min assist   Bladder Continence   continent  wearing depends   Bowel Management  continent  continent; constipated. Had enema 1/11 with results   Stair Climbing     Total assist  Communication  independent  expressive aphasia and noted motor planning issues   Memory  intact  intact per son at bedside,Bob   Cooking/Meal Prep  independent; cooks twice per week; microwavable meals mainly      Housework  independent    Money Management  independent    Driving        Previous Home Environment Living Arrangements: Other (Comment) (73 yo Grandson, Lysbeth Galas, lives with her out of his neccessit)  Lives With: Other (Comment) (gradnson, Lysbeth Galas lives w her; he works third shift) Available Help at Discharge: Family;Friend(s);Available PRN/intermittently Mikki Santee lives 3 house down but works, church and friends and fam) Type of Home:  House Home Layout: One level Home Access: Stairs to enter Entrance Stairs-Rails: None Technical brewer of Steps: 1 step entry and then 3 small steps Bathroom Shower/Tub: Optometrist: No Home Care Services: No  Discharge Living Setting Plans for Discharge Living Setting: Patient's home Type of Home at Discharge: House Discharge Home Layout: One level Discharge Home Access: Stairs to enter Entrance Stairs-Rails: None Entrance Stairs-Number of Steps: 1 step and then 3 small steps Discharge Bathroom Shower/Tub: Tub/shower unit Discharge Bathroom Toilet: Standard Discharge Bathroom Accessibility: No Does the patient have any problems obtaining your medications?: No  Social/Family/Support Systems Patient Roles: Parent Contact Information: Zuzu Befort, son at [phone] Anticipated Caregiver: Delford Field and family/friends prn Anticipated Caregiver's Contact Information: laniyah rosenwald, [phone] Ability/Limitations of Caregiver: Mikki Santee works, Lysbeth Galas works third shift Caregiver Availability: Intermittent Discharge Plan Discussed with Primary Caregiver: Yes Is Caregiver In Agreement with Plan?: Yes Does Caregiver/Family have Issues with Lodging/Transportation while Pt is in Rehab?: No  Goals/Additional Needs Patient/Family Goal for Rehab: supervision to Mod I PT, OT, and SLP Expected length of stay: ELOS 10 to 14 days Dietary Needs: Regular diet with thin liquids Pt/Family Agrees to Admission and willing to participate: Yes Program Orientation Provided & Reviewed with Pt/Caregiver Including Roles  & Responsibilities: Yes  Patient Condition: Patient condition is appropriate for intense inpatient rehabilitation and pt is medically ready for admission today.  Preadmission Screen Completed By:  Cleatrice Burke, 03/05/2013 9:03 AM ______________________________________________________________________   Discussed status  with Dr. Naaman Plummer on 03/05/13 at [phone] and received telephone approval for admission today.  Admission Coordinator:  Cleatrice Burke, time 6948 Date  03/05/13.   Assessment/Plan: Diagnosis: 1. Does the need for close, 24 hr/day  Medical supervision in concert with the patient's rehab needs make it unreasonable for this patient to be served in a less intensive setting? {yes_no_potentially:3041433} 2. Co-Morbidities requiring supervision/potential complications: *** 3. Due to {due NI:6270350}, does the patient require 24 hr/day rehab nursing? {yes_no_potentially:3041433} 4. Does the patient require coordinated care of a physician, rehab nurse, {coordinated KXFG:1829937} to address physical and functional deficits in the context of the above medical diagnosis(es)? {yes_no_potentially:3041433} Addressing deficits in the following areas: {deficits:3041436} 5. Can the patient actively participate in an intensive therapy program of at least 3 hrs of therapy 5 days a week? {yes_no_potentially:3041433} 6. The potential for patient to make measurable gains while on inpatient rehab is {potential:3041437} 7. Anticipated functional outcomes upon discharge from inpatients are *** PT, *** OT, ***SLP 8. Estimated rehab length of stay to reach the above functional goals is: *** 9. Does the patient have adequate social supports to accommodate these discharge functional goals? {yes_no_potentially:3041433} 10. Anticipated D/C setting: {anticipated dc setting:21604} 11. Anticipated post D/C treatments: {post dc treatment:21605} 12. Overall Rehab/Functional Prognosis: {potential:3041437}    RECOMMENDATIONS: This patient's  condition is appropriate for continued rehabilitative care in the following setting: {appropriate setting:21606} Patient has agreed to participate in recommended program. {yes_no_potentially:3041433} Note that insurance prior authorization may be required for reimbursement for recommended  care.  Comment:  Cleatrice Burke 03/05/2013

## 2013-03-05 ENCOUNTER — Encounter: Payer: Self-pay | Admitting: Physical Medicine and Rehabilitation

## 2013-03-05 ENCOUNTER — Encounter (HOSPITAL_COMMUNITY): Payer: Self-pay | Admitting: *Deleted

## 2013-03-05 ENCOUNTER — Inpatient Hospital Stay (HOSPITAL_COMMUNITY)
Admission: RE | Admit: 2013-03-05 | Discharge: 2013-03-12 | DRG: 945 | Disposition: A | Discharge status: Home / Self Care | Payer: Medicare HMO | Source: Other Acute Inpatient Hospital | Attending: Physical Medicine & Rehabilitation | Admitting: Physical Medicine & Rehabilitation

## 2013-03-05 ENCOUNTER — Other Ambulatory Visit: Payer: Self-pay | Admitting: Physical Medicine and Rehabilitation

## 2013-03-05 DIAGNOSIS — I6330 Cerebral infarction due to thrombosis of unspecified cerebral artery: Secondary | ICD-10-CM

## 2013-03-05 DIAGNOSIS — I639 Cerebral infarction, unspecified: Secondary | ICD-10-CM | POA: Diagnosis present

## 2013-03-05 DIAGNOSIS — Z885 Allergy status to narcotic agent status: Secondary | ICD-10-CM

## 2013-03-05 DIAGNOSIS — K5909 Other constipation: Secondary | ICD-10-CM

## 2013-03-05 DIAGNOSIS — Z7902 Long term (current) use of antithrombotics/antiplatelets: Secondary | ICD-10-CM | POA: Diagnosis not present

## 2013-03-05 DIAGNOSIS — G8929 Other chronic pain: Secondary | ICD-10-CM

## 2013-03-05 DIAGNOSIS — I771 Stricture of artery: Secondary | ICD-10-CM

## 2013-03-05 DIAGNOSIS — Z79899 Other long term (current) drug therapy: Secondary | ICD-10-CM | POA: Diagnosis not present

## 2013-03-05 DIAGNOSIS — I10 Essential (primary) hypertension: Secondary | ICD-10-CM | POA: Diagnosis not present

## 2013-03-05 DIAGNOSIS — F17200 Nicotine dependence, unspecified, uncomplicated: Secondary | ICD-10-CM

## 2013-03-05 DIAGNOSIS — I739 Peripheral vascular disease, unspecified: Secondary | ICD-10-CM | POA: Diagnosis not present

## 2013-03-05 DIAGNOSIS — F411 Generalized anxiety disorder: Secondary | ICD-10-CM | POA: Diagnosis not present

## 2013-03-05 DIAGNOSIS — E785 Hyperlipidemia, unspecified: Secondary | ICD-10-CM

## 2013-03-05 DIAGNOSIS — Z5189 Encounter for other specified aftercare: Secondary | ICD-10-CM | POA: Diagnosis present

## 2013-03-05 DIAGNOSIS — J449 Chronic obstructive pulmonary disease, unspecified: Secondary | ICD-10-CM

## 2013-03-05 DIAGNOSIS — Z981 Arthrodesis status: Secondary | ICD-10-CM | POA: Diagnosis not present

## 2013-03-05 DIAGNOSIS — G459 Transient cerebral ischemic attack, unspecified: Secondary | ICD-10-CM | POA: Diagnosis present

## 2013-03-05 DIAGNOSIS — R110 Nausea: Secondary | ICD-10-CM | POA: Diagnosis not present

## 2013-03-05 DIAGNOSIS — R29898 Other symptoms and signs involving the musculoskeletal system: Secondary | ICD-10-CM | POA: Diagnosis not present

## 2013-03-05 DIAGNOSIS — E039 Hypothyroidism, unspecified: Secondary | ICD-10-CM | POA: Diagnosis not present

## 2013-03-05 DIAGNOSIS — Z8673 Personal history of transient ischemic attack (TIA), and cerebral infarction without residual deficits: Secondary | ICD-10-CM

## 2013-03-05 DIAGNOSIS — Z888 Allergy status to other drugs, medicaments and biological substances status: Secondary | ICD-10-CM

## 2013-03-05 DIAGNOSIS — Z9089 Acquired absence of other organs: Secondary | ICD-10-CM

## 2013-03-05 DIAGNOSIS — F329 Major depressive disorder, single episode, unspecified: Secondary | ICD-10-CM

## 2013-03-05 DIAGNOSIS — F3289 Other specified depressive episodes: Secondary | ICD-10-CM

## 2013-03-05 DIAGNOSIS — K5900 Constipation, unspecified: Secondary | ICD-10-CM

## 2013-03-05 DIAGNOSIS — I779 Disorder of arteries and arterioles, unspecified: Secondary | ICD-10-CM | POA: Diagnosis present

## 2013-03-05 DIAGNOSIS — R488 Other symbolic dysfunctions: Secondary | ICD-10-CM

## 2013-03-05 DIAGNOSIS — Z7982 Long term (current) use of aspirin: Secondary | ICD-10-CM | POA: Diagnosis not present

## 2013-03-05 DIAGNOSIS — F19939 Other psychoactive substance use, unspecified with withdrawal, unspecified: Secondary | ICD-10-CM

## 2013-03-05 DIAGNOSIS — M25519 Pain in unspecified shoulder: Secondary | ICD-10-CM

## 2013-03-05 DIAGNOSIS — K219 Gastro-esophageal reflux disease without esophagitis: Secondary | ICD-10-CM

## 2013-03-05 DIAGNOSIS — J4489 Other specified chronic obstructive pulmonary disease: Secondary | ICD-10-CM

## 2013-03-05 DIAGNOSIS — Z96659 Presence of unspecified artificial knee joint: Secondary | ICD-10-CM

## 2013-03-05 LAB — MRSA PCR SCREENING: MRSA by PCR: NEGATIVE

## 2013-03-05 MED ORDER — NICOTINE 7 MG/24HR TD PT24
7.0000 mg | MEDICATED_PATCH | TRANSDERMAL | Status: DC
  Administered 2013-03-05 – 2013-03-11 (×7): 7 mg via TRANSDERMAL
  Filled 2013-03-05 (×8): qty 1

## 2013-03-05 MED ORDER — OXYCODONE-ACETAMINOPHEN 5-325 MG PO TABS: 1.0000 | ORAL_TABLET | ORAL | Status: DC | PRN

## 2013-03-05 MED ORDER — PNEUMOCOCCAL VAC POLYVALENT 25 MCG/0.5ML IJ INJ
0.5000 mL | INJECTION | INTRAMUSCULAR | Status: AC
  Administered 2013-03-06: 0.5 mL via INTRAMUSCULAR
  Filled 2013-03-05: qty 0.5

## 2013-03-05 MED ORDER — DULOXETINE HCL 20 MG PO CPEP
40.0000 mg | ORAL_CAPSULE | Freq: Every day | ORAL | Status: DC
  Administered 2013-03-06 – 2013-03-12 (×7): 40 mg via ORAL
  Filled 2013-03-05 (×8): qty 2

## 2013-03-05 MED ORDER — ONDANSETRON HCL 4 MG PO TABS: 4.0000 mg | ORAL_TABLET | Freq: Four times a day (QID) | ORAL | Status: DC | PRN

## 2013-03-05 MED ORDER — SIMVASTATIN 5 MG PO TABS
5.0000 mg | ORAL_TABLET | Freq: Every day | ORAL | Status: DC
  Administered 2013-03-05 – 2013-03-11 (×7): 5 mg via ORAL
  Filled 2013-03-05 (×8): qty 1

## 2013-03-05 MED ORDER — ALUM & MAG HYDROXIDE-SIMETH 200-200-20 MG/5ML PO SUSP: 30.0000 mL | ORAL | Status: DC | PRN

## 2013-03-05 MED ORDER — LEVOTHYROXINE SODIUM 88 MCG PO TABS
88.0000 ug | ORAL_TABLET | Freq: Every day | ORAL | Status: DC
  Administered 2013-03-06 – 2013-03-12 (×7): 88 ug via ORAL
  Filled 2013-03-05 (×8): qty 1

## 2013-03-05 MED ORDER — FLEET ENEMA 7-19 GM/118ML RE ENEM
1.0000 | ENEMA | Freq: Once | RECTAL | Status: AC | PRN
  Filled 2013-03-05: qty 1

## 2013-03-05 MED ORDER — ENOXAPARIN SODIUM 40 MG/0.4ML ~~LOC~~ SOLN
40.0000 mg | SUBCUTANEOUS | Status: DC
  Administered 2013-03-05 – 2013-03-11 (×7): 40 mg via SUBCUTANEOUS
  Filled 2013-03-05 (×9): qty 0.4

## 2013-03-05 MED ORDER — GUAIFENESIN-DM 100-10 MG/5ML PO SYRP: 5.0000 mL | ORAL_SOLUTION | Freq: Four times a day (QID) | ORAL | Status: DC | PRN

## 2013-03-05 MED ORDER — CLOPIDOGREL BISULFATE 75 MG PO TABS
75.0000 mg | ORAL_TABLET | Freq: Every day | ORAL | Status: DC
  Administered 2013-03-06 – 2013-03-12 (×7): 75 mg via ORAL
  Filled 2013-03-05 (×8): qty 1

## 2013-03-05 MED ORDER — FAMOTIDINE 20 MG PO TABS
20.0000 mg | ORAL_TABLET | Freq: Two times a day (BID) | ORAL | Status: DC
  Administered 2013-03-05 – 2013-03-12 (×14): 20 mg via ORAL
  Filled 2013-03-05 (×16): qty 1

## 2013-03-05 MED ORDER — NICOTINE 14 MG/24HR TD PT24: 14.0000 mg | MEDICATED_PATCH | Freq: Every day | TRANSDERMAL | Status: DC

## 2013-03-05 MED ORDER — NIACIN ER 500 MG PO CPCR
500.0000 mg | ORAL_CAPSULE | Freq: Every day | ORAL | Status: DC
  Administered 2013-03-05 – 2013-03-11 (×7): 500 mg via ORAL
  Filled 2013-03-05 (×8): qty 1

## 2013-03-05 MED ORDER — DIPHENHYDRAMINE HCL 12.5 MG/5ML PO ELIX
12.5000 mg | ORAL_SOLUTION | Freq: Four times a day (QID) | ORAL | Status: DC | PRN
  Filled 2013-03-05: qty 10

## 2013-03-05 MED ORDER — ASPIRIN EC 81 MG PO TBEC
81.0000 mg | DELAYED_RELEASE_TABLET | Freq: Every day | ORAL | Status: DC
  Administered 2013-03-06 – 2013-03-12 (×7): 81 mg via ORAL
  Filled 2013-03-05 (×8): qty 1

## 2013-03-05 MED ORDER — DILTIAZEM HCL 60 MG PO TABS
120.0000 mg | ORAL_TABLET | Freq: Every day | ORAL | Status: DC
  Filled 2013-03-05: qty 2

## 2013-03-05 MED ORDER — TRAZODONE HCL 50 MG PO TABS: 25.0000 mg | ORAL_TABLET | Freq: Every evening | ORAL | Status: DC | PRN

## 2013-03-05 MED ORDER — TRAMADOL HCL 50 MG PO TABS: 50.0000 mg | ORAL_TABLET | Freq: Four times a day (QID) | ORAL | Status: DC | PRN

## 2013-03-05 MED ORDER — ACETAMINOPHEN 325 MG PO TABS: 325.0000 mg | ORAL_TABLET | ORAL | Status: DC | PRN

## 2013-03-05 MED ORDER — POLYETHYLENE GLYCOL 3350 17 G PO PACK
17.0000 g | PACK | Freq: Every day | ORAL | Status: DC | PRN
  Filled 2013-03-05: qty 1

## 2013-03-05 MED ORDER — DILTIAZEM HCL ER COATED BEADS 120 MG PO CP24
120.0000 mg | ORAL_CAPSULE | Freq: Every day | ORAL | Status: DC
  Administered 2013-03-06 – 2013-03-12 (×7): 120 mg via ORAL
  Filled 2013-03-05 (×8): qty 1

## 2013-03-05 MED ORDER — BISACODYL 10 MG RE SUPP
10.0000 mg | Freq: Every day | RECTAL | Status: DC | PRN
  Administered 2013-03-08: 10 mg via RECTAL
  Filled 2013-03-05: qty 1

## 2013-03-05 MED ORDER — ONDANSETRON HCL 4 MG/2ML IJ SOLN: 4.0000 mg | Freq: Four times a day (QID) | INTRAMUSCULAR | Status: DC | PRN

## 2013-03-05 NOTE — PMR Pre-admission (Signed)
[facility]  PMR Admission Coordinator Pre-Admission Assessment  Patient: [name] is an 78 y.o., female  MRN: [id number]  DOB: 1935/08/07  Height: 5\' 4"  (162.6 cm)  Weight: 70.308 kg (155 lb)  Insurance Information  HMO: PPO: yes PCP: IPA: 80/20: OTHER: Medicare advantage Plan  PRIMARY: Aetna Medicare Policy#: [id number] Subscriber: pt  CM Name: Ernestene Mention Phone#: [phone] Fax#: [phone]  Pre-Cert#: [id number] Employer: retired approved 1/14 through 1/18 with update due 1/16  Benefits: Phone #: [phone] Name: 1/12  Eff. Date: 02/20/13 Deduct: none Out of Pocket Max: $3950 Life Max: none  CIR: $245 copay per day for each day days 1 thru 6 SNF: no copay days 1 thru 20; $140 copay per day days 21-100  Outpatient: $40 copay per visit Co-Pay: no visit limit  Home Health: 100% Co-Pay: no visit limit  DME: 80% Co-Pay: 20%  Providers: in network   SECONDARY: none  Emergency Contact Information  Contact Information    Name  Relation  Home  Work  [place]  Son  [place]  [phone]      Current Medical History  Patient Admitting Diagnosis: R MCA CVA  History of Present Illness: Admitted 02/28/13 to [facility] for scheduled subclavian stent placement. Postop presentation for left sided weakness and intermittent expressive aphasia. Felt to be embolic from procedure. Given plavix and [place]. Echo negative for cardiac source.  Patient's medical record from [facility] has been reviewed by the rehabilitation admission coordinator and physician.  NIH Stroke scale: not available  Glascow Coma Scale:  Past Medical History  Past Medical History   Diagnosis  Date   .  Arthritis    .  Hypertension    .  PONV (postoperative nausea and vomiting)      severe n/v after every surgery   .  Hypothyroidism    .  Hypothyroid    .  Peripheral vascular disease    .  Osteoarthritis    .  GERD (gastroesophageal reflux disease)    .  Constipation due to pain  medication    .  COPD (chronic obstructive pulmonary disease)    .  Depression     Family History  family history is not on file.  Prior Rehab/Hospitalizations: none  Current Medications  ASA  Biotin  Calcium  Cymbalta  Diltiazem  Fish oil  Falx seed oil  Multivitamin  Niacin  Synthroid  Vit c  Clopidogrel  Pravastatin  Patients Current Diet: Regular diet with thin liquids  Precautions / Restrictions  Fall precautions  Prior Activity Level  Community (5-7x/wk): [employer] water exercises 4 to 5 times per week; drives self  Home Assistive Devices / Equipment  Home Assistive Devices/Equipment: None   Prior Functional Level  Current Functional Level   Bed Mobility  Independent  Min assist   Transfers  Independent  Min assist   Mobility - Walk/Wheelchair  Independent  Min assist   Upper Body Dressing  Independent  Min assist   Lower Body Dressing  Independent  Mod assist   Grooming  Independent  Mod assist   Eating/Drinking  Independent  Mod assist   Toilet Transfer  Independent  Min assist   Bladder Continence  continent  wearing depends   Bowel Management  continent  continent; constipated. Had enema 1/11 with results   Stair Climbing   Total assist   Communication  independent  expressive aphasia and noted motor planning issues   Memory  intact  intact per son at bedside,Bob   Cooking/Meal Prep  independent; cooks twice per week; microwavable meals mainly    Housework  independent    Money Management  independent    Driving     Previous Home Environment  Living Arrangements: Other (Comment) (76 yo Grandson, Sheria LangCameron, lives with her out of his neccessit)  Lives With: Other (Comment) (gradnson, Sheria LangCameron lives w her; he works third shift)  Available Help at Discharge: Family;Friend(s);Available PRN/intermittently Nadine Counts(Bob lives 3 house down but works, church and friends and fam)  Type of Home: House  Home Layout: One level  Home Access: Stairs to enter  Entrance Stairs-Rails:  None  Secretary/administratorntrance Stairs-Number of Steps: 1 step entry and then 3 small steps  Bathroom Shower/Tub: Merchant navy officerTub/shower unit  Bathroom Toilet: Standard  Bathroom Accessibility: No  Home Care Services: No  Discharge Living Setting  Plans for Discharge Living Setting: Patient's home  Type of Home at Discharge: House  Discharge Home Layout: One level  Discharge Home Access: Stairs to enter  Entrance Stairs-Rails: None  Entrance Stairs-Number of Steps: 1 step and then 3 small steps  Discharge Bathroom Shower/Tub: Tub/shower unit  Discharge Bathroom Toilet: Standard  Discharge Bathroom Accessibility: No  Does the patient have any problems obtaining your medications?: No  Social/Family/Support Systems  Patient Roles: Parent  Contact Information: Elinor DodgeBob Kerner, son at [phone]  Anticipated Caregiver: Lovenia KimBob, Cameron and family/friends prn  Anticipated Caregiver's Contact Information: Elinor DodgeBob Soden, [phone]  Ability/Limitations of Caregiver: Nadine CountsBob works, Sheria LangCameron works third shift  Caregiver Availability: Intermittent  Discharge Plan Discussed with Primary Caregiver: Yes  Is Caregiver In Agreement with Plan?: Yes  Does Caregiver/Family have Issues with Lodging/Transportation while Pt is in Rehab?: No  Goals/Additional Needs  Patient/Family Goal for Rehab: supervision to Mod I PT, OT, and SLP  Expected length of stay: ELOS 10 to 14 days  Dietary Needs: Regular diet with thin liquids  Pt/Family Agrees to Admission and willing to participate: Yes  Program Orientation Provided & Reviewed with Pt/Caregiver Including Roles & Responsibilities: Yes  Patient Condition: Patient condition is appropriate for intense inpatient rehabilitation and pt is medically ready for admission today.  Preadmission Screen Completed By: Clois DupesBoyette, Barbara Godwin, 03/05/2013 9:03 AM  ______________________________________________________________________  Discussed status with Dr. Riley KillSwartz on 03/05/13 at [phone] and received telephone  approval for admission today.  Admission Coordinator: Clois DupesBoyette, Barbara Godwin, time [id number] Date 03/05/13.  Assessment/Plan:  Diagnosis: Right MCA infarct 1. Does the need for close, 24 hr/day Medical supervision in concert with the patient's rehab needs make it unreasonable for this patient to be served in a less intensive setting? Yes 2. Co-Morbidities requiring supervision/potential complications: htn, GERD, COPD, depression 3. Due to bladder management, bowel management, safety, skin/wound care, disease management, medication administration, pain management and patient education, does the patient require 24 hr/day rehab nursing? Yes 4. Does the patient require coordinated care of a physician, rehab nurse, PT 1-2 hrs/day, 5 days/week), OT (1-2 hrs/day, 5 days/week) and SLP (1-2 hrs/day, 5 days/week) to address physical and functional deficits in the context of the above medical diagnosis(es)? Yes Addressing deficits in the following areas: balance, endurance, locomotion, strength, transferring, bowel/bladder control, bathing, dressing, feeding, grooming, toileting, cognition, speech, communication, swallowing and psychosocial support 5. Can the patient actively participate in an intensive therapy program of at least 3 hrs of therapy 5 days a week? Yes 6. The potential for patient to make measurable gains while on inpatient rehab is excellent 7. Anticipated functional outcomes upon discharge from inpatients are  mod I PT, mod I OT, mod I SLP 8. Estimated rehab length of stay to reach the above functional goals is: 10-14 days 9. Does the patient have adequate social supports to accommodate these discharge functional goals? Yes 10. Anticipated D/C setting: Home 11. Anticipated post D/C treatments: [place] therapy 12. Overall Rehab/Functional Prognosis: excellent RECOMMENDATIONS:  This patient's condition is appropriate for continued rehabilitative care in the following setting: CIR  Patient has agreed to  participate in recommended program. Yes  Note that insurance prior authorization may be required for reimbursement for recommended care.  Comment:  Pt to be admitted for inpatient rehab today.  Meredith Staggers, MD, [place]  Cleatrice Burke  03/05/2013

## 2013-03-05 NOTE — H&P (Signed)
Physical Medicine and Rehabilitation Admission H&P  CC: Left sided weakness, apraxia, speech difficulties    HPI: Ms. Ayushi Pla is a 78 year old left handed female with history of TIA, CAS, bilateral symptomatic subclavian artery stenosis who was admitted to [facility] on 02/27/13 for thoracic aortogram, RUE angiogram and PTA of R-SA. Post op with waxing and waning symptoms of speech difficulties as well as left sided weakness. CT head done and negative for acute changes. MRI not done due to hx of metal in eye. Cardiac echo with EF 60-65% and no wall abnormality. Evaluated by Hospitalist and neuro and patient on ASA and plavix for embolic stroke.     Review of Systems  Eyes: Negative for blurred vision and double vision.  Respiratory: Negative for cough and shortness of breath.  Cardiovascular: Negative for chest pain and palpitations.  Gastrointestinal: Positive for nausea, vomiting and constipation (had to be disimpacted Sunday).  Genitourinary: Negative for urgency and frequency.  Wears depends Musculoskeletal: Positive for back pain, joint pain (chronic bilateral shoulder pain), myalgias and neck pain (chronic).  Neurological: Positive for speech change and focal weakness. Negative for headaches.  Psychiatric/Behavioral: Positive for depression. The patient is nervous/anxious.   Past Medical History   Diagnosis  Date   .  Arthritis    .  Hypertension    .  PONV (postoperative nausea and vomiting)      severe n/v after every surgery   .  Hypothyroidism    .  Hypothyroid    .  Peripheral vascular disease    .  Osteoarthritis    .  GERD (gastroesophageal reflux disease)    .  Constipation due to pain medication    .  COPD (chronic obstructive pulmonary disease)    .  Depression     Past Surgical History   Procedure  Laterality  Date   .  Total knee arthroplasty     .  Carotid stent     .  Shoulder surgery     .  Abdominal hysterectomy     .  Cholecystectomy     .  Back  surgery     .  Rotator cuff repair     .  Eye surgery       cataract /lens both eyes   .  Vascular surgery     .  Joint replacement   [id number]     knees   .  Anterior cervical decomp/discectomy fusion  N/A  04/03/2012     Procedure: ANTERIOR CERVICAL DECOMPRESSION/DISCECTOMY FUSION 2 LEVELS; Surgeon: Hosie Spangle, MD; Location: [facility]; Service: Neurosurgery; Laterality: N/A; Cervical four-five,Cervical five-six anterior cervical decompression with fusion plating and bonegraft   .  Cholecystectomy      No family history on file.  Social History: Grandson lives with her. Independent and does water aerobic 4-5 times a week. reports that she has been smoking Cigarettes--1/2 to 1 PPD. Marland Kitchen She has a 22.5 pack-year smoking history. She has never used smokeless tobacco. She reports that she does not drink alcohol or use illicit drugs.  Allergies   Allergen  Reactions   .  Aleve [Naproxen Sodium]  Hives and Palpitations   .  Celebrex [Celecoxib]  Nausea And Vomiting   .  Codeine  Nausea And Vomiting   .  Morphine And Related  Nausea And Vomiting     (Not in a hospital admission)  Home:   Functional History:   Functional Status:  Mobility:  Min assist for transfer  CGA ambulating 35 feet with cues for sequencing. Left lean and  Difficulty advancing LLE.  ADL:  Moderate assist for grooming  Min assist for UB dressing.  Moderate assist for LB dressing.  Total assist for toileting.  Cognition:  Expressive deficits--apraxia?  Physical Exam:  BP: 138/70 P: 78 R: 20 T-97.6 Ht: 5'1" Wt: 134.6 lbs   Constitutional: She is oriented to person, place, and time.  Thin female. Sitting eob. No distress HENT: oral mucosa pink and moist Head: Normocephalic and atraumatic.  Eyes: Conjunctivae are normal. Pupils are equal, round, and reactive to light.  Neck: Normal range of motion. Neck supple.  Cardiovascular: Normal rate and regular rhythm. No murmurs, rubs, gallops Respiratory:  Effort normal and breath sounds normal. No respiratory distress. She has no wheezes.  No rales  GI: Soft. Bowel sounds are normal. Abdomen non distended, non tender Musculoskeletal: She exhibits no edema and no tenderness. Left shoulder with mild tenderness with passive ROM. Difficulty with abduction of left arm (baseline weakness due to RTC injury) Stasis changes bilateral shins with evidence of healing abrasions.  Neurological: She is alert and oriented to person, place, and time.  Speech clear but hesitant with occasional word finding difficulty. Identified "spoon", "watch", "condiment cup". Gave me date but response was broken.  Follows basic commands without difficulty.  LUE is 2- with deltoid, tricep, bicep is 3-, wrist and HI are 3+ to 4-. LLE is 3 to 3+ with HF and KE, 4- to 4/5 with ADF and APF. Apraxic with volitional movement of left side as well as language. Appears to have mild left inattention. Sensation appears to be grossly intact to pain and light touch. CN exam is grossly intact. Good sitting balance.  Skin: Skin is warm and dry.  Psychiatric: She has a normal mood and affect. Her behavior is normal. Thought content normal.   Laboratory tests:  Na: 136 K+: 3.9 Cl: 105 Co2: 27 BUN: 11 Cr: 0.65 Glu: 92  Chol: 169 HDL: 57 LDL: 101 Trig: 56    Post Admission Physician Evaluation:  1. Functional deficits secondary to thrombotic right MCA infarct. 2. Patient is admitted to receive collaborative, interdisciplinary care between the physiatrist, rehab nursing staff, and therapy team. 3. Patient's level of medical complexity and substantial therapy needs in context of that medical necessity cannot be provided at a lesser intensity of care such as a SNF. 4. Patient has experienced substantial functional loss from his/her baseline which was documented above under the "Functional History" and "Functional Status" headings. Judging by the patient's diagnosis, physical exam, and functional  history, the patient has potential for functional progress which will result in measurable gains while on inpatient rehab. These gains will be of substantial and practical use upon discharge in facilitating mobility and self-care at the household level. 5. Physiatrist will provide 24 hour management of medical needs as well as oversight of the therapy plan/treatment and provide guidance as appropriate regarding the interaction of the two. 6. 24 hour rehab nursing will assist with bladder management, bowel management, safety, skin/wound care, disease management, medication administration, pain management and patient education and help integrate therapy concepts, techniques,education, etc. 7. PT will assess and treat for/with: Lower extremity strength, range of motion, stamina, balance, functional mobility, safety, adaptive techniques and equipment, NMR, cognitive perceptual awareness, visual spatial awareness. Goals are: mod I to supervision. 8. OT will assess and treat for/with: ADL's, functional mobility, safety, upper extremity strength, adaptive techniques and equipment, NMR,  cognitive perceptual awareness, visual perceptual awareness, education. Goals are: mod I to supervision. 9. SLP will assess and treat for/with: cognition, language, communication. Goals are: mod I. 10. Case Management and Social Worker will assess and treat for psychological issues and discharge planning. 11. Team conference will be held weekly to assess progress toward goals and to determine barriers to discharge. 12. Patient will receive at least 3 hours of therapy per day at least 5 days per week. 13. ELOS: 10-14 days  14. Prognosis: excellent   Medical Problem List and Plan:  1. DVT Prophylaxis/Anticoagulation: Pharmaceutical: Lovenox  2. Chronic bilateral shoulder pain/Pain Management: Will use oxycodone prn  3. H/o depression/Mood: High levels of anxiety improving per reports. Continue cymbalta. Team to provide ego  support. LCSW to follow for support and evaluation.  4. Neuropsych: This patient is capable of making decisions on her own behalf.  5. Dyslipidemia: On pravastatin.  6. Hypothyroid: Supplement recently increased due to low TSH.  7. Nausea: Will add pepcid as has hx of GERD.  8. Tobacco abuse: will start nicotine patch.   Meredith Staggers, MD, [facility]   03/05/2013

## 2013-03-05 NOTE — Progress Notes (Addendum)
1530 Pt. States it has been >10 yrs since her last Pneumonia Vaccine.  Per Nsg Protocol pt. Appears to be eligible to receive second vaccine.  Patient is current smoker/tobacco user. Vaccine ordered by RN.

## 2013-03-05 NOTE — Progress Notes (Signed)
1355 Patient arrived to 4M10 from [facility].  Transported via ambulance.   Patient settled into room, oriented to rehab, call bell in reach and VS obtained.  Bed Alarm set for safety.  Report received from Ivin Booty, Therapist, sports at [facility] [employer] [facility].

## 2013-03-06 ENCOUNTER — Inpatient Hospital Stay (HOSPITAL_COMMUNITY): Payer: Medicare HMO | Admitting: Speech Pathology

## 2013-03-06 ENCOUNTER — Inpatient Hospital Stay (HOSPITAL_COMMUNITY): Payer: Medicare HMO

## 2013-03-06 ENCOUNTER — Inpatient Hospital Stay (HOSPITAL_COMMUNITY): Payer: Medicare HMO | Admitting: Occupational Therapy

## 2013-03-06 DIAGNOSIS — I6330 Cerebral infarction due to thrombosis of unspecified cerebral artery: Secondary | ICD-10-CM

## 2013-03-06 DIAGNOSIS — G8110 Spastic hemiplegia affecting unspecified side: Secondary | ICD-10-CM

## 2013-03-06 LAB — CBC WITH DIFFERENTIAL/PLATELET
BASOS PCT: 0 % (ref 0–1)
Basophils Absolute: 0 10*3/uL (ref 0.0–0.1)
Eosinophils Absolute: 0.1 10*3/uL (ref 0.0–0.7)
Eosinophils Relative: 2 % (ref 0–5)
HEMATOCRIT: 37.1 % (ref 36.0–46.0)
Hemoglobin: 12.7 g/dL (ref 12.0–15.0)
Lymphocytes Relative: 30 % (ref 12–46)
Lymphs Abs: 1.4 10*3/uL (ref 0.7–4.0)
MCH: 32.9 pg (ref 26.0–34.0)
MCHC: 34.2 g/dL (ref 30.0–36.0)
MCV: 96.1 fL (ref 78.0–100.0)
Monocytes Absolute: 0.5 10*3/uL (ref 0.1–1.0)
Monocytes Relative: 12 % (ref 3–12)
NEUTROS ABS: 2.7 10*3/uL (ref 1.7–7.7)
Neutrophils Relative %: 56 % (ref 43–77)
Platelets: 284 10*3/uL (ref 150–400)
RBC: 3.86 MIL/uL — ABNORMAL LOW (ref 3.87–5.11)
RDW: 12.9 % (ref 11.5–15.5)
WBC: 4.7 10*3/uL (ref 4.0–10.5)

## 2013-03-06 LAB — COMPREHENSIVE METABOLIC PANEL
ALBUMIN: 2.8 g/dL — AB (ref 3.5–5.2)
ALT: 14 U/L (ref 0–35)
AST: 17 U/L (ref 0–37)
Alkaline Phosphatase: 48 U/L (ref 39–117)
BUN: 15 mg/dL (ref 6–23)
CO2: 28 meq/L (ref 19–32)
CREATININE: 0.58 mg/dL (ref 0.50–1.10)
Calcium: 8.2 mg/dL — ABNORMAL LOW (ref 8.4–10.5)
Chloride: 100 mEq/L (ref 96–112)
GFR calc non Af Amer: 88 mL/min — ABNORMAL LOW (ref >90)
Glucose, Bld: 96 mg/dL (ref 70–99)
Potassium: 4.7 mEq/L (ref 3.7–5.3)
Sodium: 139 mEq/L (ref 137–147)
Total Bilirubin: 0.3 mg/dL (ref 0.3–1.2)
Total Protein: 5.7 g/dL — ABNORMAL LOW (ref 6.0–8.3)

## 2013-03-06 NOTE — Progress Notes (Signed)
78 year old left handed female with history of TIA, CAS, bilateral symptomatic subclavian artery stenosis who was admitted to [facility] on 02/27/13 for thoracic aortogram, RUE angiogram and PTA of R-SA. Post op with waxing and waning symptoms of speech difficulties as well as left sided weakness. CT head done and negative for acute changes. MRI not done due to hx of metal in eye. Cardiac echo with EF 60-65% and no wall abnormality  Subjective/Complaints: Slept well Enjoyed shower  Objective: Vital Signs: Blood pressure 133/71, pulse 70, temperature 98 F (36.7 C), temperature source Oral, resp. rate 18, height _0  (1.549 m), weight 60.2 kg (132 lb 11.5 oz), SpO2 95.00%. No results found. Results for orders placed during the hospital encounter of 03/05/13 (from the past 72 hour(s))  MRSA PCR SCREENING     Status: None   Collection Time    03/05/13  3:42 PM      Result Value Range   MRSA by PCR NEGATIVE  NEGATIVE   Comment:            The GeneXpert MRSA Assay (FDA     approved for NASAL specimens     only), is one component of a     comprehensive MRSA colonization     surveillance program. It is not     intended to diagnose MRSA     infection nor to guide or     monitor treatment for     MRSA infections.  CBC WITH DIFFERENTIAL     Status: Abnormal   Collection Time    03/06/13  4:54 AM      Result Value Range   WBC 4.7  4.0 - 10.5 K/uL   RBC 3.86 (*) 3.87 - 5.11 MIL/uL   Hemoglobin 12.7  12.0 - 15.0 g/dL   HCT 37.1  36.0 - 46.0 %   MCV 96.1  78.0 - 100.0 fL   MCH 32.9  26.0 - 34.0 pg   MCHC 34.2  30.0 - 36.0 g/dL   RDW 12.9  11.5 - 15.5 %   Platelets 284  150 - 400 K/uL   Neutrophils Relative % 56  43 - 77 %   Neutro Abs 2.7  1.7 - 7.7 K/uL   Lymphocytes Relative 30  12 - 46 %   Lymphs Abs 1.4  0.7 - 4.0 K/uL   Monocytes Relative 12  3 - 12 %   Monocytes Absolute 0.5  0.1 - 1.0 K/uL   Eosinophils Relative 2  0 - 5 %   Eosinophils Absolute 0.1  0.0 - 0.7 K/uL   Basophils  Relative 0  0 - 1 %   Basophils Absolute 0.0  0.0 - 0.1 K/uL  COMPREHENSIVE METABOLIC PANEL     Status: Abnormal   Collection Time    03/06/13  4:54 AM      Result Value Range   Sodium 139  137 - 147 mEq/L   Potassium 4.7  3.7 - 5.3 mEq/L   Chloride 100  96 - 112 mEq/L   CO2 28  19 - 32 mEq/L   Glucose, Bld 96  70 - 99 mg/dL   BUN 15  6 - 23 mg/dL   Creatinine, Ser 0.58  0.50 - 1.10 mg/dL   Calcium 8.2 (*) 8.4 - 10.5 mg/dL   Total Protein 5.7 (*) 6.0 - 8.3 g/dL   Albumin 2.8 (*) 3.5 - 5.2 g/dL   AST 17  0 - 37 U/L   ALT 14  0 - 35 U/L   Alkaline Phosphatase 48  39 - 117 U/L   Total Bilirubin 0.3  0.3 - 1.2 mg/dL   GFR calc non Af Amer 88 (*) >90 mL/min   GFR calc Af Amer >90  >90 mL/min   Comment: (NOTE)     The eGFR has been calculated using the CKD EPI equation.     This calculation has not been validated in all clinical situations.     eGFR's persistently <90 mL/min signify possible Chronic Kidney     Disease.     HEENT: normal Cardio: RRR and no murmurs Resp: CTA B/L and unlabored GI: BS positive and ND Extremity:  No Edema Skin:   Other healed knee and abd incisons Neuro: Alert/Oriented, Cranial Nerve II-XII normal, Normal Sensory, Abnormal Motor 2-/5 Left delt, 3- bi, tri, grip, 4/5 Bilateral HF, KE, ADF, 5/5 in RUE, Abnormal FMC Ataxic/ dec FMC and Reflexes: 3+ Musc/Skel:  Other decreased LUE shoulder ROM Gen NAD   Assessment/Plan: 1. Functional deficits secondary to probable R subcortical  infarct  Causing LUE weakness which require 3+ hours per day of interdisciplinary therapy in a comprehensive inpatient rehab setting. Physiatrist is providing close team supervision and 24 hour management of active medical problems listed below. Physiatrist and rehab team continue to assess barriers to discharge/monitor patient progress toward functional and medical goals. FIM:       FIM - Toileting Toileting steps completed by patient: Adjust clothing prior to  toileting Toileting Assistive Devices: Grab bar or rail for support Toileting: 2: Max-Patient completed 1 of 3 steps           Comprehension Comprehension Mode: Auditory Comprehension: 6-Follows complex conversation/direction: With extra time/assistive device  Expression Expression Mode: Verbal Expression: 7-Expresses complex ideas: With no assist  Social Interaction Social Interaction: 6-Interacts appropriately with others with medication or extra time (anti-anxiety, antidepressant).  Problem Solving Problem Solving: 5-Solves complex 90% of the time/cues < 10% of the time  Memory Memory: 6-More than reasonable amt of time  Medical Problem List and Plan:  1. DVT Prophylaxis/Anticoagulation: Pharmaceutical: Lovenox  2. Chronic bilateral shoulder pain/Pain Management: Will use oxycodone prn  3. H/o depression/Mood: High levels of anxiety improving per reports. Continue cymbalta. Team to provide ego support. LCSW to follow for support and evaluation.  4. Neuropsych: This patient is capable of making decisions on her own behalf.  5. Dyslipidemia: On pravastatin.  6. Hypothyroid: Supplement recently increased due to low TSH.  7. Nausea: Will add pepcid as has hx of GERD.  8. Tobacco abuse: will start nicotine patch.    LOS (Days) 1 A FACE TO FACE EVALUATION WAS PERFORMED  KIRSTEINS,ANDREW E 03/06/2013, 10:12 AM

## 2013-03-06 NOTE — Progress Notes (Signed)
Nutrition Brief Note  Patient identified on the Malnutrition Screening Tool (MST) Report  Wt Readings from Last 15 Encounters:  03/05/13 132 lb 11.5 oz (60.2 kg)  03/03/13 155 lb (70.308 kg)  03/29/12 129 lb (58.514 kg)  10/21/10 133 lb 2.5 oz (60.4 kg)    Body mass index is 25.09 kg/(m^2). Patient meets criteria for overweight based on current BMI.   Current diet order is Heart Healthy, patient is consuming approximately 100% of meals at this time. Labs and medications reviewed.   RD drawn to chart due to Malnutrition Screening Tool for possible wt loss due [facility]  Pt's wt has been stable for the past 2 years (around 130-135 lbs).  She is currently eating 100% of meals and is not requiring alternative consistencies/textures at this time. No acute nutrition interventions warranted at this time. If nutrition issues arise, please consult RD.   Brynda Greathouse, MS RD LDN Clinical Inpatient Dietitian Pager: [phone] Weekend/After hours pager: [phone]

## 2013-03-06 NOTE — Care Management Note (Signed)
Inpatient [facility] Individual Statement of Services  Patient Name:  [name]  Date:  03/06/2013  Welcome to the [facility].  Our goal is to provide you with an individualized program based on your diagnosis and situation, designed to meet your specific needs.  With this comprehensive rehabilitation program, you will be expected to participate in at least 3 hours of rehabilitation therapies Monday-Friday, with modified therapy programming on the weekends.  Your rehabilitation program will include the following services:  Physical Therapy (PT), Occupational Therapy (OT), Speech Therapy (ST), 24 hour per day rehabilitation nursing, Case Management (Social Worker), Rehabilitation Medicine, Nutrition Services and Pharmacy Services  Weekly team conferences will be held on Wednesday to discuss your progress.  Your Social Worker will talk with you frequently to get your input and to update you on team discussions.  Team conferences with you and your family in attendance may also be held.  Expected length of stay: 10-12 days  Overall anticipated outcome: mod/i-supervision level  Depending on your progress and recovery, your program may change. Your Social Worker will coordinate services and will keep you informed of any changes. Your Social Worker's name and contact numbers are listed  below.  The following services may also be recommended but are not provided by the [facility] will be made to provide these services after discharge if needed.  Arrangements include referral to agencies that provide these services.  Your insurance has been verified to be:  [employer] Your primary doctor is:  Dr Sinda Du  Pertinent information will be shared with your doctor and your insurance company.  Social Worker:  Ovidio Kin, [place] or ([phone]  Information discussed with and copy given to patient by: [name], 03/06/2013, 12:56 PM

## 2013-03-06 NOTE — Evaluation (Signed)
Speech Language Pathology Assessment and Plan  Patient Details  Name: [name] MRN: [id number] Date of Birth: May 06, 1935  SLP Diagnosis: Aphasia  Rehab Potential: Good ELOS: 10-12 days   Today's Date: 03/06/2013 Time: 1435-1530 Time Calculation (min): 55 min  Problem List:  Patient Active Problem List   Diagnosis Date Noted  . CVA (cerebral infarction) 03/05/2013  . Cervical pain 11/01/2010   Past Medical History:  Past Medical History  Diagnosis Date  . Arthritis   . Hypertension   . PONV (postoperative nausea and vomiting)     severe n/v after every surgery  . Hypothyroidism   . Hypothyroid   . Peripheral vascular disease   . Osteoarthritis   . GERD (gastroesophageal reflux disease)   . Constipation due to pain medication   . COPD (chronic obstructive pulmonary disease)   . Depression    Past Surgical History:  Past Surgical History  Procedure Laterality Date  . Total knee arthroplasty    . Carotid stent    . Shoulder surgery    . Abdominal hysterectomy    . Cholecystectomy    . Back surgery    . Rotator cuff repair    . Eye surgery      cataract /lens both eyes  . Vascular surgery    . Joint replacement  [id number]    knees  . Anterior cervical decomp/discectomy fusion N/A 04/03/2012    Procedure: ANTERIOR CERVICAL DECOMPRESSION/DISCECTOMY FUSION 2 LEVELS;  Surgeon: Hosie Spangle, MD;  Location: [facility];  Service: Neurosurgery;  Laterality: N/A;  Cervical four-five,Cervical five-six anterior cervical decompression with fusion plating and bonegraft  . Cholecystectomy      Assessment / Plan / Recommendation Clinical Impression  [name] is a 78 year old left handed female with history of TIA, CAS, bilateral symptomatic subclavian artery stenosis who was admitted to [facility] on 02/27/13 for thoracic aortogram, RUE angiogram and PTA of R-SA. Post op with waxing and waning symptoms of speech difficulties as well as left sided weakness. CT  head done and negative for acute changes. MRI not done due to hx of metal in eye. Cardiac echo with EF 60-65% and no wall abnormality. Evaluated by Hospitalist and neuro and patient on ASA and plavix for embolic stroke. Patient transferred to [facility] 03/05/13; order received; Cognitive-linguistic evaluation completed. Patient was administered the [facility] Cognitive Assessment [facility]) and demonstrated impaired naming, repetition abilities, and fluency. Fluency appeared to be most impaired; the patient named six words that began with F in one minute. Memory and cognition appear to be [facility] with increased time for processing. Patient carried on a conversation about where she had worked before retirement and had appropriate social skills. Patient needed supervision level cues to express thoughts and answer questions. As a result, patient requires skilled SLP services to maximize functional communication prior to discharge home.     SLP initiated treatment by educating patient and son regarding rehab philosophy, CVA recovery process as well as word finding strategies.  Continue with current plan of care.     SLP Assessment  Patient will need skilled [facility] Services during CIR admission    Recommendations  Patient destination: Home Follow up Recommendations: Other (comment) (TBD) Equipment Recommended: None recommended by SLP    SLP Frequency 5 out of 7 days   SLP Treatment/Interventions Cueing hierarchy;Functional tasks;Internal/external aids;Patient/family education;Therapeutic Activities;Speech/Language facilitation    Pain Pain Assessment Pain Assessment: No/denies pain Prior Functioning Cognitive/Linguistic Baseline: Within functional limits Type of  Home: House Available Help at Discharge: Available PRN/intermittently;Family;Friend(s) Vocation: Retired  Industrial/product designer Term Goals: Week 1: SLP Short Term Goal 1 (Week 1): Patient will identify 3 word retreival strategies with Mod I SLP Short  Term Goal 2 (Week 1): Patient will utilize word retreival strategies during structured activities with Min verbal cues SLP Short Term Goal 3 (Week 1): Patient will utilize word retreival stategies during open ended conversations with Supervision level verbal cues  See FIM for current functional status Refer to Care Plan for Long Term Goals  Recommendations for other services: None  Discharge Criteria: Patient will be discharged from SLP if patient refuses treatment 3 consecutive times without medical reason, if treatment goals not met, if there is a change in medical status, if patient makes no progress towards goals or if patient is discharged from hospital.  The above assessment, treatment plan, treatment alternatives and goals were discussed and mutually agreed upon: by patient  Gunnar Fusi, M.A., CCC-SLP [phone]  [place] 03/06/2013, 4:34 PM

## 2013-03-06 NOTE — Progress Notes (Signed)
Patient information reviewed and entered into eRehab system by Jc Veron, RN, CRRN, PPS Coordinator.  Information including medical coding and functional independence measure will be reviewed and updated through discharge.     Per nursing patient was given "Data Collection Information Summary for Patients in Inpatient Rehabilitation Facilities with attached "Privacy Act Statement-Health Care Records" upon admission.  

## 2013-03-06 NOTE — Evaluation (Addendum)
Physical Therapy Assessment and Plan  Patient Details  Name: [name] MRN: [id number] Date of Birth: May 28, 1935  PT Diagnosis: Abnormality of gait, Ataxia, Coordination disorder, Hemiparesis dominant, Impaired cognition, Impaired sensation and Muscle weakness Rehab Potential: Good ELOS: 10-12 days   Today's Date: 03/06/2013 Time: 7510-2585 Time Calculation (min): 60 min  Problem List:  Patient Active Problem List   Diagnosis Date Noted  . CVA (cerebral infarction) 03/05/2013  . Cervical pain 11/01/2010    Past Medical History:  Past Medical History  Diagnosis Date  . Arthritis   . Hypertension   . PONV (postoperative nausea and vomiting)     severe n/v after every surgery  . Hypothyroidism   . Hypothyroid   . Peripheral vascular disease   . Osteoarthritis   . GERD (gastroesophageal reflux disease)   . Constipation due to pain medication   . COPD (chronic obstructive pulmonary disease)   . Depression    Past Surgical History:  Past Surgical History  Procedure Laterality Date  . Total knee arthroplasty    . Carotid stent    . Shoulder surgery    . Abdominal hysterectomy    . Cholecystectomy    . Back surgery    . Rotator cuff repair    . Eye surgery      cataract /lens both eyes  . Vascular surgery    . Joint replacement  [id number]    knees  . Anterior cervical decomp/discectomy fusion N/A 04/03/2012    Procedure: ANTERIOR CERVICAL DECOMPRESSION/DISCECTOMY FUSION 2 LEVELS;  Surgeon: Hosie Spangle, MD;  Location: [facility];  Service: Neurosurgery;  Laterality: N/A;  Cervical four-five,Cervical five-six anterior cervical decompression with fusion plating and bonegraft  . Cholecystectomy      Assessment & Plan Clinical ImpressionMs. [name] is a 78 year old left handed female with history of TIA, CAS, bilateral symptomatic subclavian artery stenosis who was admitted to [facility] on 02/27/13 for thoracic aortogram, RUE angiogram and PTA of R-SA. Post  op with waxing and waning symptoms of speech difficulties as well as left sided weakness. CT head done and negative for acute changes. MRI not done due to hx of metal in eye. Cardiac echo with EF 60-65% and no wall abnormality. Evaluated by Hospitalist and neuro and patient on ASA and plavix for embolic stroke.   Patient transferred to [facility] on 03/05/2013 .   Patient currently requires mod with mobility secondary to impaired timing and sequencing, unbalanced muscle activation, ataxia and decreased coordination, R-sided gaze preference, decreased L-sided attention,  , decreased safety awareness and delayed processing and decreased sitting balance, decreased standing balance, hemiplegia and decreased balance strategies.  Prior to hospitalization, patient was independent  with mobility and lived with Other (Comment) ((gradnson, [name] lives w her; he works third shift)) in a [employer] home.  Home access is 1+3 then step into kitchenStairs to enter.  Patient will benefit from skilled PT intervention to maximize safe functional mobility, minimize fall risk and decrease caregiver burden for planned discharge home with intermittent supervision.  Anticipate patient will benefit from follow up [place] at discharge.  PT - End of Session Activity Tolerance: Tolerates 10 - 20 min activity with multiple rests Endurance Deficit: Yes Endurance Deficit Description: Pt needing staff push WC after 60' PT Assessment Rehab Potential: Good Barriers to Discharge: Decreased caregiver support PT Patient demonstrates impairments in the following area(s): Balance;Endurance;Motor;Sensory;Skin Integrity;Safety PT Transfers Functional Problem(s): Bed Mobility;Bed to Chair;Car;Furniture;Floor PT Locomotion Functional Problem(s): Ambulation;Stairs PT Plan PT  Intensity: Minimum of 1-2 x/day ,45 to 90 minutes PT Frequency: 5 out of 7 days PT Duration Estimated Length of Stay: 10-14 days PT Treatment/Interventions: Ambulation/gait  training;Balance/vestibular training;Cognitive remediation/compensation;Community reintegration;Discharge planning;Disease management/prevention;DME/adaptive equipment instruction;Functional mobility training;Neuromuscular re-education;Patient/family education;Psychosocial support;Skin care/wound management;Stair training;Therapeutic Activities;Therapeutic Exercise;UE/LE Strength taining/ROM;UE/LE Coordination activities;Visual/perceptual remediation/compensation;Wheelchair propulsion/positioning PT Transfers Anticipated Outcome(s): Mod I basic, S car PT Locomotion Anticipated Outcome(s): Mod I LRAD PT Recommendation Recommendations for Other Services: Neuropsych consult Follow Up Recommendations: Home health PT;24 hour supervision/assistance Patient destination: Home Equipment Recommended: To be determined Equipment Details: May need cane  Skilled Therapeutic Intervention Tx initiated upon eval for NMR via forced use of LUE/LE in sitting and standing. Pt educated on forced-use principles in basic terms, as well as gaze preference and safety. Pt noted to have delayed processing, dropping some words, and difficulty with word finding.   Performed static and dynamic sitting and standing balance with close S for sitting tasks, and min A for standing tasks, including reaching with RUE in all directions in/outside BOS.  Pt educated on findings of Berg balance test and functional /safety implications.  NMR during gait training as well for manual facilitation of weigh-shifting and timing. Pt with markedly slow gait speed.  Pt left up in [facility] with handoff to MSW.   PT Evaluation Precautions/Restrictions Precautions Precautions: Fall Precaution Comments: LUE weakness Restrictions Weight Bearing Restrictions: No General   Vital SignsTherapy Vitals Pulse Rate: 76 Oxygen Therapy SpO2: 96 % O2 Device: None (Room air) Pain Pain Assessment Pain Assessment: No/denies pain Pain Score: 0-No pain Home  Living/Prior Functioning Home Living Available Help at Discharge: Available PRN/intermittently;Family;Friend(s) Mikki Santee lives 3 house down but works, church and friends and fam) Type of Home: House Home Access: Stairs to enter Technical brewer of Steps: 1+3 then step into Editor, commissioning: None Home Layout: One level Additional Comments: No device for gai tPTA  Lives With: Other (Comment) ((gradnson, [name] lives w her; he works third shift)) Prior Function Level of Independence: Independent with transfers;Independent with gait;Independent with basic ADLs  Able to Take Stairs?: Yes Driving: Yes Vocation: Other (comment) [employer]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) Comments: mostly microwave meals; cooks maybe twice per week per chart, patient reports going to water aerobics 4-5 times/week for exercise Vision/Perception  Vision - History Baseline Vision: Wears glasses only for reading Visual History: Cataracts Patient Visual Report: No change from baseline Vision - Assessment Eye Alignment: Impaired (comment) (R gaze preference) Vision Assessment: Vision not tested Perception Perception: Within Functional Limits Praxis Praxis: Intact  Cognition Overall Cognitive Status: Within Functional Limits for tasks assessed Arousal/Alertness: Awake/alert Orientation Level: Oriented X4 Comments: Pt reports mentally "slow" since  Sensation Sensation Light Touch: Impaired Detail Light Touch Impaired Details: Impaired LLE;Impaired LUE Stereognosis: Appears Intact Hot/Cold: Impaired by gross assessment (LUE) Proprioception: Impaired by gross assessment (LUE) Coordination Gross Motor Movements are Fluid and Coordinated: No Fine Motor Movements are Fluid and Coordinated: No Coordination and Movement Description: Decreased timing, accuracy and excursion with LE and UE movements Heel Shin Test: Decreased excursion and timing  Motor  Motor Motor: Hemiplegia;Motor  impersistence Motor - Skilled Clinical Observations: L-sided weakness UE>LE, impersistent LLE weakness with fatigue. Pt demonstrates rigid movement patterns with UEs L>R  Mobility Bed Mobility Bed Mobility: Rolling Left;Sit to Supine;Left Sidelying to Sit Rolling Left: 5: Supervision Left Sidelying to Sit: 3: Mod assist;HOB flat Left Sidelying to Sit Details (indicate cue type and reason): Trunk lifting assist Sit to Supine: 4: Min assist Sit to Supine - Details (  indicate cue type and reason): RLE lifting assist Transfers Transfers: Yes Stand Pivot Transfers: 4: Min assist Stand Pivot Transfer Details (indicate cue type and reason): Steadying assist Locomotion  Ambulation Ambulation: Yes Ambulation/Gait Assistance: 4: Min assist Ambulation Distance (Feet): 40 Feet Assistive device: None Ambulation/Gait Assistance Details: Decreased L weight-shifting, heel strike, and LLE abducted.  Gait Gait: Yes Gait Pattern: Impaired Gait Pattern: Step-to pattern;Decreased dorsiflexion - left;Decreased hip/knee flexion - left;Decreased stance time - left;Decreased weight shift to left Stairs / Additional Locomotion Stairs: Yes Stairs Assistance: 3: Mod assist Stairs Assistance Details (indicate cue type and reason): Steadying assist for descending Stair Management Technique: One rail Right;Step to pattern;Forwards Number of Stairs: 5 Height of Stairs: 6 Wheelchair Mobility Wheelchair Mobility: Yes Wheelchair Assistance: 4: Min Lexicographer: Both lower extermities Wheelchair Parts Management: Needs assistance Distance: 60  Trunk/Postural Assessment  Cervical Assessment Cervical Assessment: Exceptions to [facility] (Forward head posture. and cervical arthropolasty 4 levels) Thoracic Assessment Thoracic Assessment: Exceptions to [facility] (Kyphotic) Lumbar Assessment Lumbar Assessment:  (H/o sugery for stenosis) Postural Control Postural Control: Within Functional Limits   Balance Balance Balance Assessed: Yes Standardized Balance Assessment Standardized Balance Assessment: Berg Balance Test Berg Balance Test Sit to Stand: Needs minimal aid to stand or to stabilize Standing Unsupported: Able to stand 30 seconds unsupported Sitting with Back Unsupported but Feet Supported on Floor or Stool: Able to sit safely and securely 2 minutes Stand to Sit: Controls descent by using hands Transfers: Needs one person to assist Standing Unsupported with Eyes Closed: Able to stand 10 seconds with supervision Standing Ubsupported with Feet Together: Able to place feet together independently but unable to hold for 30 seconds From Standing, Reach Forward with Outstretched Arm: Can reach confidently >25 cm (10") From Standing Position, Pick up Object from Floor: Able to pick up shoe, needs supervision From Standing Position, Turn to Look Behind Over each Shoulder: Turn sideways only but maintains balance Turn 360 Degrees: Able to turn 360 degrees safely but slowly Standing Unsupported, Alternately Place Feet on Step/Stool: Needs assistance to keep from falling or unable to try Standing Unsupported, One Foot in Front: Able to take small step independently and hold 30 seconds Standing on One Leg: Tries to lift leg/unable to hold 3 seconds but remains standing independently Total Score: 30 Extremity Assessment  RUE Assessment RUE Assessment: Exceptions to [facility] (H/o RC wear with 3 failed surgery per pt, decreased AROM) LUE Assessment LUE Assessment: Exceptions to [facility] (New hemi-weakness in addition to [facility] issues after 3 failed surgeries) RLE Assessment RLE Assessment: Within Functional Limits LLE Assessment LLE Assessment: Exceptions to [facility] LLE Strength LLE Overall Strength Comments: 3+/5 throughout, except 4/5 ankle, fragile skin noted  FIM:  FIM - Control and instrumentation engineer Devices: Arm rests Bed/Chair Transfer: 3: Bed > Chair or W/C: Mod A (lift or  lower assist);4: Chair or W/C > Bed: Min A (steadying Pt. > 75%) FIM - Locomotion: Wheelchair Distance: 60 Locomotion: Wheelchair: 2: Travels 50 - 149 ft with minimal assistance (Pt.>75%) FIM - Locomotion: Ambulation Locomotion: Ambulation Assistive Devices: Other (comment) (None) Ambulation/Gait Assistance: 4: Min assist Locomotion: Ambulation: 1: Travels less than 50 ft with minimal assistance (Pt.>75%)   Refer to Care Plan for Long Term Goals  Recommendations for other services: Neuropsych  Discharge Criteria: Patient will be discharged from PT if patient refuses treatment 3 consecutive times without medical reason, if treatment goals not met, if there is a change in medical status, if patient makes no progress  towards goals or if patient is discharged from hospital.  The above assessment, treatment plan, treatment alternatives and goals were discussed and mutually agreed upon: by patient  Kennieth Rad, PT, DPT  03/06/2013, 12:04 PM

## 2013-03-06 NOTE — Plan of Care (Signed)
Problem: RH BOWEL ELIMINATION Goal: RH STG MANAGE BOWEL W/MEDICATION W/ASSISTANCE STG Manage Bowel with Medication with min Assistance.  Outcome: Progressing Results after milk of magnesia ordered from previous unit.

## 2013-03-06 NOTE — Progress Notes (Signed)
Social Work Assessment and Plan Social Work Assessment and Plan  Patient Details  Name: [name] MRN: [id number] Date of Birth: September 30, 1935  Today's Date: 03/06/2013  Problem List:  Patient Active Problem List   Diagnosis Date Noted  . CVA (cerebral infarction) 03/05/2013  . Cervical pain 11/01/2010   Past Medical History:  Past Medical History  Diagnosis Date  . Arthritis   . Hypertension   . PONV (postoperative nausea and vomiting)     severe n/v after every surgery  . Hypothyroidism   . Hypothyroid   . Peripheral vascular disease   . Osteoarthritis   . GERD (gastroesophageal reflux disease)   . Constipation due to pain medication   . COPD (chronic obstructive pulmonary disease)   . Depression    Past Surgical History:  Past Surgical History  Procedure Laterality Date  . Total knee arthroplasty    . Carotid stent    . Shoulder surgery    . Abdominal hysterectomy    . Cholecystectomy    . Back surgery    . Rotator cuff repair    . Eye surgery      cataract /lens both eyes  . Vascular surgery    . Joint replacement  [id number]    knees  . Anterior cervical decomp/discectomy fusion N/A 04/03/2012    Procedure: ANTERIOR CERVICAL DECOMPRESSION/DISCECTOMY FUSION 2 LEVELS;  Surgeon: Hosie Spangle, MD;  Location: [place] [facility];  Service: Neurosurgery;  Laterality: N/A;  Cervical four-five,Cervical five-six anterior cervical decompression with fusion plating and bonegraft  . Cholecystectomy     Social History:  reports that she has been smoking Cigarettes.  She has a 22.5 pack-year smoking history. She uses smokeless tobacco. She reports that she does not drink alcohol or use illicit drugs.  Family / Support Systems Marital Status: Widow/Widower Patient Roles: Parent Children: Bob-son  [phone]  [phone] Other Supports: grandson lives with her, two other son' out of state. Anticipated Caregiver: Patient Ability/Limitations of Caregiver: Son can  check on her and grandson is home at night Caregiver Availability: Intermittent Family Dynamics: Pt has three son's two are out of state, but Mikki Santee is local and close by.  She reports: " He is busy and works also."  She has friends who are supportive but she has been the one to assist others, not the other way around.  Social History Preferred language: English Religion: Methodist Cultural Background: No issues Education: [employer] Read: Yes Write: Yes Employment Status: Retired Freight forwarder Issues: No issues Guardian/Conservator: None-according to MD pt is capable of mkaing her own decisions while here.   Abuse/Neglect Physical Abuse: Denies Verbal Abuse: Denies Sexual Abuse: Denies Exploitation of patient/patient's resources: Denies Self-Neglect: Denies  Emotional Status Pt's affect, behavior adn adjustment status: Pt is motivated to improve and will work hard in therapies.  She has always been independent and cared for others.  This is new to her and she wants to do whatever she needs to do to regain her independence. Recent Psychosocial Issues: Other medical issues-feels she was managing them well.  Her one vice is smoking and she realizes this is bad for her and is unsure if she will quit or not. Pyschiatric History: History of depression-no currently taking any meds for, she feels she is doing ok at this time.  Deferred depression screen at this time was focused on her constipation and bowel issues.  Will monitor her coping while here Substance Abuse History: Tobacco continues to smoke-unsure if will  quit.  She has smoked for years.  Patient / Family Perceptions, Expectations & Goals Pt/Family understanding of illness & functional limitations: Pt is able to explain her condition and deficits.  She is enocuraged by the progress she has made already and is hopeful she will continue to make gains.  Her concern is if she needs care what will she do, enocuraged her to  discuss with her son and come up with a plan. Premorbid pt/family roles/activities: Mother, grandmother, Retiree, Friend, Home owner, etc Anticipated changes in roles/activities/participation: resume Pt/family expectations/goals: Pt states; " I want to be able to take care of myself, before I  leave here."  Son states: " I will help but I work and don't live with her."  [employer]: None Premorbid Home Care/DME Agencies: None Transportation available at discharge: [employer] referrals recommended: Support group (specify) ([facility] Support group)  Discharge Planning Living Arrangements: Other relatives Support Systems: Children;Other relatives;Friends/neighbors;Church/faith community Type of Residence: Private residence Insurance Resources: Multimedia programmer (specify) Financial Resources:  ([place]) Financial Screen Referred: No Living Expenses: Own Money Management: Patient Does the patient have any problems obtaining your medications?: No Home Management: Patient Patient/Family Preliminary Plans: Return home needs to be mod/i level to retrun home.  There is no one there with her to assist-her grandson is there during the day and works third shift, but can not provide assistance.  Pt's local son works and can do intermittent assist.  Will await team's eval and come up with a discharge plan. Social Work Anticipated Follow Up Needs: HH/OP;Support Group  Clinical Impression Pleasant motivated female who is making good gains already.  She only has intermittent assist so needs to be mod/i level before going home.  If this can not be achieved will discuss discharge options and come up With a safe discharge plan.  [name] 03/06/2013, 1:14 PM

## 2013-03-06 NOTE — Evaluation (Signed)
Occupational Therapy Assessment and Plan  Patient Details  Name: [name] MRN: [id number] Date of Birth: 03-13-1935  OT Diagnosis: hemiparesis affecting dominant side, muscle weakness (generalized), decreased sensation, left inattention spatially and inattention of left side of body, impaired balance and decreased coordination.  Rehab Potential: Rehab Potential: Good ELOS: 10-14 days   Today's Date: 03/06/2013 Time: 0900-1000 and 4580-9983 Time Calculation (min): 60 min and 15 min  Problem List:  Patient Active Problem List   Diagnosis Date Noted  . CVA (cerebral infarction) 03/05/2013  . Cervical pain 11/01/2010    Past Medical History:  Past Medical History  Diagnosis Date  . Arthritis   . Hypertension   . PONV (postoperative nausea and vomiting)     severe n/v after every surgery  . Hypothyroidism   . Hypothyroid   . Peripheral vascular disease   . Osteoarthritis   . GERD (gastroesophageal reflux disease)   . Constipation due to pain medication   . COPD (chronic obstructive pulmonary disease)   . Depression    Past Surgical History:  Past Surgical History  Procedure Laterality Date  . Total knee arthroplasty    . Carotid stent    . Shoulder surgery    . Abdominal hysterectomy    . Cholecystectomy    . Back surgery    . Rotator cuff repair    . Eye surgery      cataract /lens both eyes  . Vascular surgery    . Joint replacement  [id number]    knees  . Anterior cervical decomp/discectomy fusion N/A 04/03/2012    Procedure: ANTERIOR CERVICAL DECOMPRESSION/DISCECTOMY FUSION 2 LEVELS;  Surgeon: Hosie Spangle, MD;  Location: [facility];  Service: Neurosurgery;  Laterality: N/A;  Cervical four-five,Cervical five-six anterior cervical decompression with fusion plating and bonegraft  . Cholecystectomy      Assessment & Plan Clinical Impression: 78 year old left handed female with history of TIA, CAS, bilateral symptomatic subclavian artery stenosis who  was admitted to [facility] on 02/27/13 for thoracic aortogram, RUE angiogram and PTA of R-SA. Post op with waxing and waning symptoms of speech difficulties as well as left sided weakness. CT head done and negative for acute changes. MRI not done due to hx of metal in eye. Cardiac echo with EF 60-65% and no wall abnormality. Evaluated by Hospitalist and neuro and patient on ASA and plavix for embolic stroke.  Patient transferred to CIR on 03/05/2013 .    Patient currently requires max to min assist with basic self-care skills and IADL secondary to muscle weakness, impaired timing and sequencing, unbalanced muscle activation and decreased coordination, decreased attention to left and decreased standing balance, decreased postural control, hemiparesis and decreased balance strategies.  Prior to hospitalization, was independent and did water aerobics 3-4 times per week.  Patient's grandson lives with her and works  Patient will benefit from skilled intervention to increase independence with basic self-care skills and increase level of independence with iADL prior to discharge at an overall Modified Independent level with supervision for dressing and tub shower transfer.  Anticipate patient will require intermittent supervision and follow up home health.  OT - End of Session Activity Tolerance: Decreased this session Endurance Deficit: Yes OT Assessment Rehab Potential: Good OT Patient demonstrates impairments in the following area(s): Balance;Cognition;Endurance;Perception;Sensory;Motor OT Basic ADL's Functional Problem(s): Eating;Grooming;Bathing;Dressing;Toileting OT Advanced ADL's Functional Problem(s): Light Housekeeping;Simple Meal Preparation OT Transfers Functional Problem(s): Toilet;Tub/Shower OT Additional Impairment(s): Fuctional Use of Upper Extremity OT Plan OT Intensity: Minimum  of 1-2 x/day, 45 to 90 minutes OT Frequency: 5 out of 7 days OT Duration/Estimated Length of Stay: 10-14 days OT  Treatment/Interventions: Balance/vestibular training;Cognitive remediation/compensation;Community reintegration;DME/adaptive equipment instruction;Discharge planning;Functional mobility training;Neuromuscular re-education;Psychosocial support;Patient/family education;Self Care/advanced ADL retraining;UE/LE Strength taining/ROM;Therapeutic Activities;UE/LE Coordination activities;Visual/perceptual remediation/compensation;Wheelchair propulsion/positioning OT Self Feeding Anticipated Outcome(s): Mod I with AE PRN OT Basic Self-Care Anticipated Outcome(s): Supervision OT Toileting Anticipated Outcome(s): Mod I OT Bathroom Transfers Anticipated Outcome(s): Mod I with toilet transfer, Supervision with tub/shower transfer OT Recommendation Patient destination: Home Follow Up Recommendations: Home health OT (Intermittent Supervision) Equipment Recommended: Tub/shower bench  Skilled Therapeutic Intervention 1)  OT eval and self care retraining to include toileting, toilet transfer, shower, dress and groom.  Patient required assist for thorough clean up after BM as well as assist in shower to be certain her buttocks was clean.  Patient ambulated in the room and bathroom with HHA and occasional furniture and wall walking.  Focused session on forced use of LUE, attention to left, activity tolerance, and dynamic balance.  2)  Patient resting in w/c upon arrival and her son was present.  Reviewed patient's vision and visual history as well as discussed considering tub bench to improve independence and safety.  Both patient and son agree.  OT Evaluation Precautions/Restrictions  Precautions Precautions: Fall Precaution Comments: LUE weakness, premorbid bilateral rotator cuff issues (3 surgeries to each shoulder), left inattention Restrictions Weight Bearing Restrictions: No Pain Pain Assessment Pain Assessment: No/denies pain Home Living/Prior Functioning Home Living Family/patient expects to be  discharged to:: Private residence Living Arrangements: Other (Comment) (grandson lives in home; children are close by) Available Help at Discharge: Available PRN/intermittently;Family;Friend(s) Mikki Santee lives 3 house down but works, church and friends and fam) Type of Home: House Home Access: Stairs to enter Technical brewer of Steps: 1+3 then step into Editor, commissioning: None Home Layout: One level Additional Comments: No device for gai tPTA  Lives With: Other (Comment) ((gradnson, Lysbeth Galas lives w her; he works third shift)) Prior Function Level of Independence: Independent with transfers;Independent with gait;Independent with basic ADLs  Able to Take Stairs?: Yes Driving: Yes 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  Scores vary from min assist to max assist. Vision/Perception  Vision - History Baseline Vision: Wears glasses only for reading (A small piece of metal flew in her left eye as a child.) Visual History: Cataracts Patient Visual Report: No change from baseline Vision - Assessment Vision Assessment: Vision tested Visual Fields: No apparent deficits Perception Perception: Impaired Inattention/Neglect: Does not attend to left visual field;Does not attend to left side of body  Cognition Overall Cognitive Status: Within Functional Limits for tasks assessed Sensation Sensation Light Touch: Impaired Detail Light Touch Impaired Details: Impaired LLE;Impaired LUE Stereognosis: Appears Intact Hot/Cold: Impaired by gross assessment (LUE) Proprioception: Impaired by gross assessment (LUE) Coordination Gross Motor Movements are Fluid and Coordinated: No Fine Motor Movements are Fluid and Coordinated: No Coordination and Movement Description: Decreased timing,  accuracy and excursion with LE and UE movements Motor  Motor Motor: Hemiplegia;Motor impersistence Motor - Skilled Clinical Observations: L-sided weakness UE>LE, demonstrates rigid movement patterns with UEs L>R Mobility  Bed Mobility Bed Mobility: Not assessed Transfers Transfers: Sit to Stand;Stand to Sit Sit to Stand: 4: Min assist Stand to Sit: 4: Min assist  Trunk/Postural Assessment  Cervical Assessment Cervical Assessment: Exceptions  to [facility] (Forward head posture. and cervical arthropolasty 4 levels) Thoracic Assessment Thoracic Assessment: Exceptions to [facility] (Kyphotic) Lumbar Assessment Lumbar Assessment:  (H/o sugery for stenosis) Postural Control Postural Control: Within Functional Limits  Balance Standardized Balance Assessment: Berg Balance Test (per PT) Merrilee Jansky Balance Test Sit to Stand: Needs minimal aid to stand or to stabilize Standing Unsupported: Able to stand 30 seconds unsupported Sitting with Back Unsupported but Feet Supported on Floor or Stool: Able to sit safely and securely 2 minutes Stand to Sit: Controls descent by using hands Transfers: Needs one person to assist Standing Unsupported with Eyes Closed: Able to stand 10 seconds with supervision Standing Ubsupported with Feet Together: Able to place feet together independently but unable to hold for 30 seconds From Standing, Reach Forward with Outstretched Arm: Can reach confidently >25 cm (10") From Standing Position, Pick up Object from Floor: Able to pick up shoe, needs supervision From Standing Position, Turn to Look Behind Over each Shoulder: Turn sideways only but maintains balance Turn 360 Degrees: Able to turn 360 degrees safely but slowly Standing Unsupported, Alternately Place Feet on Step/Stool: Needs assistance to keep from falling or unable to try Standing Unsupported, One Foot in Front: Able to take small step independently and hold 30 seconds Standing on One Leg: Tries to lift leg/unable to hold  3 seconds but remains standing independently Total Score: 30 Extremity/Trunk Assessment RUE Assessment RUE Assessment: Exceptions to [facility] (H/o RC wear with 3 failed surgery per pt, decreased AROM) LUE Assessment LUE Assessment: Exceptions to [facility] (New hemi-weakness in addition to [facility] issues after 3 failed surgeries)  FIM:  FIM - Grooming Grooming Steps: Wash, rinse, dry face;Wash, rinse, dry hands;Oral care, brush teeth, clean dentures Grooming: 4: Patient completes 3 of 4 or 4 of 5 steps FIM - Bathing Bathing Steps Patient Completed: Chest;Left Arm;Abdomen;Front perineal area;Right upper leg;Left upper leg;Right lower leg (including foot);Left lower leg (including foot) Bathing: 4: Min-Patient completes 8-9 86f 10 parts or 75+ percent FIM - Upper Body Dressing/Undressing Upper body dressing/undressing steps patient completed: Thread/unthread right bra strap;Thread/unthread right sleeve of pullover shirt/dresss;Put head through opening of pull over shirt/dress;Pull shirt over trunk Upper body dressing/undressing: 3: Mod-Patient completed 50-74% of tasks FIM - Lower Body Dressing/Undressing Lower body dressing/undressing steps patient completed: Thread/unthread right pants leg;Thread/unthread left pants leg Lower body dressing/undressing: 2: Max-Patient completed 25-49% of tasks FIM - Toileting Toileting steps completed by patient: Adjust clothing prior to toileting Toileting Assistive Devices: Grab bar or rail for support Toileting: 2: Max-Patient completed 1 of 3 steps FIM - Control and instrumentation engineer Devices: Arm rests Bed/Chair Transfer: 3: Bed > Chair or W/C: Mod A (lift or lower assist);4: Chair or W/C > Bed: Min A (steadying Pt. > 75%) FIM - Radio producer Devices: Grab bars Toilet Transfers: 4-To toilet/BSC: Min A (steadying Pt. > 75%);4-From toilet/BSC: Min A (steadying Pt. > 75%) FIM - Systems developer  Devices: Grab bars;Tub transfer bench;Walk in shower Tub/shower Transfers: 4-Out of Tub/Shower: Min A (steadying Pt. > 75%/lift 1 leg);4-Into Tub/Shower: Min A (steadying Pt. > 75%/lift 1 leg)   Refer to Care Plan for Long Term Goals  Recommendations for other services: None  Discharge Criteria: Patient will be discharged from OT if patient refuses treatment 3 consecutive times without medical reason, if treatment goals not met, if there is a change in medical status, if patient makes no progress towards goals or if patient is discharged from hospital.  The above  assessment, treatment plan, treatment alternatives and goals were discussed and mutually agreed upon: by patient  Sury Wentworth 03/06/2013, 4:41 PM

## 2013-03-07 ENCOUNTER — Inpatient Hospital Stay (HOSPITAL_COMMUNITY): Payer: Medicare HMO | Admitting: Occupational Therapy

## 2013-03-07 ENCOUNTER — Inpatient Hospital Stay (HOSPITAL_COMMUNITY): Payer: Medicare HMO | Admitting: *Deleted

## 2013-03-07 ENCOUNTER — Inpatient Hospital Stay (HOSPITAL_COMMUNITY): Payer: Medicare HMO | Admitting: Speech Pathology

## 2013-03-07 DIAGNOSIS — I6330 Cerebral infarction due to thrombosis of unspecified cerebral artery: Secondary | ICD-10-CM

## 2013-03-07 DIAGNOSIS — F3289 Other specified depressive episodes: Secondary | ICD-10-CM

## 2013-03-07 DIAGNOSIS — F329 Major depressive disorder, single episode, unspecified: Secondary | ICD-10-CM

## 2013-03-07 DIAGNOSIS — G8110 Spastic hemiplegia affecting unspecified side: Secondary | ICD-10-CM

## 2013-03-07 NOTE — Progress Notes (Signed)
Occupational Therapy Note  Patient Details  Name: [name] MRN: [id number] Date of Birth: 1935-05-26 Today's Date: 03/07/2013 Time:   1500-1530  (30 min) Pain:  none Individual session  Engaged in functional transfers, mobility, and education of son, Sherren Mocha for ambulating pt to toilet.  Pt performed tub transfer using step over technique and grab bars with minimal assistance.  Tried the sit down and put legs in technique but step over worked better.  Ambulated back to room with SPC.  Pt tripped on right side x2 with the [facility].  Was able to regain balance using stepping response.  Had son, Sherren Mocha practice ambulating pt to bathroom with gait belt and sPC.  Able to perform with no safety issues.  Instructed pt and son to go slow and use cane correctly.    Left pt in wc with safety belt on and call bell,phone within reach.       Lisa Roca 03/07/2013, 5:42 PM

## 2013-03-07 NOTE — Progress Notes (Signed)
Occupational Therapy Session Note  Patient Details  Name: [name] MRN: [id number] Date of Birth: [birth year]  Today's Date: 03/07/2013 Time: 0800-0900 Time Calculation (min): 60 min  Short Term Goals: Week 1:  OT Short Term Goal 1 (Week 1): Bath: Supervision to include sit and stand OT Short Term Goal 2 (Week 1): UB dressing: Min assist to include bra OT Short Term Goal 3 (Week 1): LB Dressing:  Min assist to include socks and shoes OT Short Term Goal 4 (Week 1): Toileting:  Supervision  OT Short Term Goal 5 (Week 1): Vision:  Patient will attend to left hand and left visual field during BADL with no more than 2 vcs  Skilled Therapeutic Interventions/Progress Updates:      Pt seen for BADL retraining of toileting, bathing, and dressing with a focus on use of L hand, standing balance, and L visual field awareness.  The first half of the session focused on self feeding using L hand 80% of the time. Pt did not need cues to attend to L visual field today and initiated using L hand without cues. She was able to hold utensil in L hand and feed self with increased time. Pt transferred from w/c to toilet using a stand pivot with min A and then ambulated 5-6 steps to shower with min A. She was able to stand in shower with only steady a needed. She donned clothing with A needed only to fasten bra and don tight slip on shoes. Pt is able to move her w/c I'tly so she was left sitting at sink to continue her grooming.   Therapy Documentation Precautions:  Precautions Precautions: Fall Precaution Comments: LUE weakness, premorbid bilateral rotator cuff issues (3 surgeries to each shoulder), left inattention Restrictions Weight Bearing Restrictions: No     Pain: Pain Assessment Pain Assessment: No/denies pain ADL:  See FIM for current functional status  Therapy/Group: Individual Therapy  [place] 03/07/2013, 10:22 AM

## 2013-03-07 NOTE — IPOC Note (Signed)
Overall Plan of Care [facility]) Patient Details Name: [name] MRN: [id number] DOB: [birth year]  Admitting Diagnosis: R CVA  Hospital Problems: Active Problems:   CVA (cerebral infarction)     Functional Problem List: Nursing Nutrition;Sensory;Safety  PT Balance;Endurance;Motor;Sensory;Skin Integrity;Safety  OT Balance;Endurance;Perception;Sensory;Motor  SLP Linguistic  TR         Basic ADL's: OT Eating;Grooming;Bathing;Dressing;Toileting     Advanced  ADL's: OT Light Housekeeping;Simple Meal Preparation     Transfers: PT Bed Mobility;Bed to Chair;Car;Furniture;Floor  OT Toilet;Tub/Shower     Locomotion: PT Ambulation;Stairs     Additional Impairments: OT Fuctional Use of Upper Extremity  SLP Communication expression    TR      Anticipated Outcomes Item Anticipated Outcome  Self Feeding Mod I with AE PRN  Swallowing      Basic self-care  Supervision  Toileting  Mod I   Bathroom Transfers Mod I with toilet transfer, Supervision with tub/shower transfer  Bowel/Bladder  patient will continue to be continent of B/B  Transfers  Mod I basic, S car  Locomotion  Mod I LRAD  Communication  Mod I  Cognition     Pain  managed at 3 or less  Safety/Judgment  Patient will call appropriately for assistance as needed   Therapy Plan: PT Intensity: Minimum of 1-2 x/day ,45 to 90 minutes PT Frequency: 5 out of 7 days PT Duration Estimated Length of Stay: 10-14 days OT Intensity: Minimum of 1-2 x/day, 45 to 90 minutes OT Frequency: 5 out of 7 days OT Duration/Estimated Length of Stay: 10-14 days SLP Intensity: Minumum of 1-2 x/day, 30 to 90 minutes SLP Frequency: 5 out of 7 days SLP Duration/Estimated Length of Stay: 10-12 days       Team Interventions: Nursing Interventions Disease Management/Prevention;Psychosocial Support;Patient/Family Education  PT interventions Ambulation/gait training;Balance/vestibular training;Cognitive  remediation/compensation;Community reintegration;Discharge planning;Disease management/prevention;DME/adaptive equipment instruction;Functional mobility training;Neuromuscular re-education;Patient/family education;Psychosocial support;Skin care/wound management;Stair training;Therapeutic Activities;Therapeutic Exercise;UE/LE Strength taining/ROM;UE/LE Coordination activities;Visual/perceptual remediation/compensation;Wheelchair propulsion/positioning  OT Interventions Journalist, newspaper;Discharge planning;Functional mobility training;Neuromuscular re-education;Psychosocial support;Patient/family education;Self Care/advanced ADL retraining;UE/LE Strength taining/ROM;Therapeutic Activities;UE/LE Coordination activities;Visual/perceptual remediation/compensation;Wheelchair propulsion/positioning  SLP Interventions Cueing hierarchy;Functional tasks;Internal/external aids;Patient/family education;Therapeutic Activities;Speech/Language facilitation  TR Interventions    SW/CM Interventions Discharge Planning;Psychosocial Support;Patient/Family Education    Team Discharge Planning: Destination: PT-Home ,OT- Home , SLP-Home Projected Follow-up: PT-Home health PT;24 hour supervision/assistance, OT-  Home health OT (Intermittent Supervision), SLP-Other (comment) (TBD) Projected Equipment Needs: PT-To be determined, OT- Tub/shower bench, SLP-None recommended by SLP Equipment Details: PT-May need cane, OT-  Patient/family involved in discharge planning: PT- Patient,  OT-Patient;Family member/caregiver, SLP-Patient  MD ELOS: 10-14 d Medical Rehab Prognosis:  Good Assessment: 78 year old left handed female with history of TIA, CAS, bilateral symptomatic subclavian artery stenosis who was admitted to [facility] on 02/27/13 for thoracic aortogram, RUE angiogram and PTA of R-SA. Post op with waxing and waning symptoms of speech difficulties as well as left  sided weakness. CT head done and negative for acute changes. MRI not done due to hx of metal in eye. Cardiac echo with EF 60-65% and no wall abnormality    Now requiring 24/7 Rehab RN,MD, as well as CIR level PT, OT and SLP.  Treatment team will focus on ADLs and mobility with goals set at Mod I  See Team Conference Notes for weekly updates to the plan of care

## 2013-03-07 NOTE — Progress Notes (Signed)
Speech Language Pathology Daily Session Note  Patient Details  Name: [name] MRN: [id number] Date of Birth: [birth year]  Today's Date: 03/07/2013 Time: 1000-1030 Time Calculation (min): 30 min  Short Term Goals: Week 1: SLP Short Term Goal 1 (Week 1): Patient will identify 3 word retreival strategies with Mod I SLP Short Term Goal 2 (Week 1): Patient will utilize word retreival strategies during structured activities with Min verbal cues SLP Short Term Goal 3 (Week 1): Patient will utilize word retreival stategies during open ended conversations with Supervision level verbal cues  Skilled Therapeutic Interventions: Skilled treatment session focused on addressing language goals.  SLP facilitated session with verbal education and demonstration as well as written aids to assist with recall of word finding strategies.  Following SLP education patient required Min assist descriptive and phonemic cues for accuracy during a structured categorical naming task. Recommend to continue with current plan of care.   FIM:  Comprehension Comprehension Mode: Auditory Comprehension: 6-Follows complex conversation/direction: With extra time/assistive device Expression Expression Mode: Verbal Expression: 5-Expresses complex 90% of the time/cues < 10% of the time Social Interaction Social Interaction: 6-Interacts appropriately with others with medication or extra time (anti-anxiety, antidepressant). Problem Solving Problem Solving: 6-Solves complex problems: With extra time Memory Memory: 6-Assistive device: No helper FIM - Eating Eating Activity: 6: More than reasonable amount of time  Pain Pain Assessment Pain Assessment: No/denies pain  Therapy/Group: Individual Therapy  Carmelia Roller., CCC-SLP [phone]  [place] 03/07/2013, 1:25 PM

## 2013-03-07 NOTE — Progress Notes (Signed)
Social Work Patient ID: [name], female   DOB: Feb 05, 1936, 78 y.o.   MRN: [id number] Faxed update to [employer] await approval for more days.

## 2013-03-07 NOTE — Progress Notes (Signed)
78 year old left handed female with history of TIA, CAS, bilateral symptomatic subclavian artery stenosis who was admitted to [place] on 02/27/13 for thoracic aortogram, RUE angiogram and PTA of R-SA. Post op with waxing and waning symptoms of speech difficulties as well as left sided weakness. CT head done and negative for acute changes. MRI not done due to hx of metal in eye. Cardiac echo with EF 60-65% and no wall abnormality  Subjective/Complaints: Slept poorly due to noise but no other c/os  Review of Systems - Negative except left hand weakness  Objective: Vital Signs: Blood pressure 164/58, pulse 78, temperature 98 F (36.7 C), temperature source Oral, resp. rate 18, height $RemoveBe'5\' 1"'gmqKHneel$  (1.549 m), weight 60.2 kg (132 lb 11.5 oz), SpO2 96.00%. No results found. Results for orders placed during the hospital encounter of 03/05/13 (from the past 72 hour(s))  MRSA PCR SCREENING     Status: None   Collection Time    03/05/13  3:42 PM      Result Value Range   MRSA by PCR NEGATIVE  NEGATIVE   Comment:            The GeneXpert MRSA Assay (FDA     approved for NASAL specimens     only), is one component of a     comprehensive MRSA colonization     surveillance program. It is not     intended to diagnose MRSA     infection nor to guide or     monitor treatment for     MRSA infections.  CBC WITH DIFFERENTIAL     Status: Abnormal   Collection Time    03/06/13  4:54 AM      Result Value Range   WBC 4.7  4.0 - 10.5 K/uL   RBC 3.86 (*) 3.87 - 5.11 MIL/uL   Hemoglobin 12.7  12.0 - 15.0 g/dL   HCT 37.1  36.0 - 46.0 %   MCV 96.1  78.0 - 100.0 fL   MCH 32.9  26.0 - 34.0 pg   MCHC 34.2  30.0 - 36.0 g/dL   RDW 12.9  11.5 - 15.5 %   Platelets 284  150 - 400 K/uL   Neutrophils Relative % 56  43 - 77 %   Neutro Abs 2.7  1.7 - 7.7 K/uL   Lymphocytes Relative 30  12 - 46 %   Lymphs Abs 1.4  0.7 - 4.0 K/uL   Monocytes Relative 12  3 - 12 %   Monocytes Absolute 0.5  0.1 - 1.0 K/uL   Eosinophils  Relative 2  0 - 5 %   Eosinophils Absolute 0.1  0.0 - 0.7 K/uL   Basophils Relative 0  0 - 1 %   Basophils Absolute 0.0  0.0 - 0.1 K/uL  COMPREHENSIVE METABOLIC PANEL     Status: Abnormal   Collection Time    03/06/13  4:54 AM      Result Value Range   Sodium 139  137 - 147 mEq/L   Potassium 4.7  3.7 - 5.3 mEq/L   Chloride 100  96 - 112 mEq/L   CO2 28  19 - 32 mEq/L   Glucose, Bld 96  70 - 99 mg/dL   BUN 15  6 - 23 mg/dL   Creatinine, Ser 0.58  0.50 - 1.10 mg/dL   Calcium 8.2 (*) 8.4 - 10.5 mg/dL   Total Protein 5.7 (*) 6.0 - 8.3 g/dL   Albumin 2.8 (*) 3.5 -  5.2 g/dL   AST 17  0 - 37 U/L   ALT 14  0 - 35 U/L   Alkaline Phosphatase 48  39 - 117 U/L   Total Bilirubin 0.3  0.3 - 1.2 mg/dL   GFR calc non Af Amer 88 (*) >90 mL/min   GFR calc Af Amer >90  >90 mL/min   Comment: (NOTE)     The eGFR has been calculated using the CKD EPI equation.     This calculation has not been validated in all clinical situations.     eGFR's persistently <90 mL/min signify possible Chronic Kidney     Disease.     HEENT: normal Cardio: RRR and no murmurs Resp: CTA B/L and unlabored GI: BS positive and ND Extremity:  No Edema Skin:   Other healed knee and abd incisons Neuro: Alert/Oriented, Cranial Nerve II-XII normal, Normal Sensory, Abnormal Motor 2-/5 Left delt, 3- bi, tri, grip, 4/5 Bilateral HF, KE, ADF, 5/5 in RUE, Abnormal FMC Ataxic/ dec FMC and Reflexes: 3+ Musc/Skel:  Other decreased LUE shoulder ROM Gen NAD   Assessment/Plan: 1. Functional deficits secondary to probable R subcortical  infarct  Causing LUE weakness which require 3+ hours per day of interdisciplinary therapy in a comprehensive inpatient rehab setting. Physiatrist is providing close team supervision and 24 hour management of active medical problems listed below. Physiatrist and rehab team continue to assess barriers to discharge/monitor patient progress toward functional and medical goals. FIM: FIM - Bathing Bathing  Steps Patient Completed: Chest;Left Arm;Abdomen;Front perineal area;Right upper leg;Left upper leg;Right lower leg (including foot);Left lower leg (including foot) Bathing: 4: Min-Patient completes 8-9 39f 10 parts or 75+ percent  FIM - Upper Body Dressing/Undressing Upper body dressing/undressing steps patient completed: Thread/unthread right bra strap;Thread/unthread right sleeve of pullover shirt/dresss;Put head through opening of pull over shirt/dress;Pull shirt over trunk Upper body dressing/undressing: 3: Mod-Patient completed 50-74% of tasks FIM - Lower Body Dressing/Undressing Lower body dressing/undressing steps patient completed: Thread/unthread right pants leg;Thread/unthread left pants leg Lower body dressing/undressing: 2: Max-Patient completed 25-49% of tasks  FIM - Toileting Toileting steps completed by patient: Adjust clothing prior to toileting Toileting Assistive Devices: Grab bar or rail for support Toileting: 2: Max-Patient completed 1 of 3 steps  FIM - Radio producer Devices: Grab bars Toilet Transfers: 4-To toilet/BSC: Min A (steadying Pt. > 75%);4-From toilet/BSC: Min A (steadying Pt. > 75%)  FIM - Bed/Chair Transfer Bed/Chair Transfer Assistive Devices: Arm rests Bed/Chair Transfer: 3: Bed > Chair or W/C: Mod A (lift or lower assist);4: Chair or W/C > Bed: Min A (steadying Pt. > 75%)  FIM - Locomotion: Wheelchair Distance: 60 Locomotion: Wheelchair: 2: Travels 50 - 149 ft with minimal assistance (Pt.>75%) FIM - Locomotion: Ambulation Locomotion: Ambulation Assistive Devices: Other (comment) (None) Ambulation/Gait Assistance: 4: Min assist Locomotion: Ambulation: 1: Travels less than 50 ft with minimal assistance (Pt.>75%)  Comprehension Comprehension Mode: Auditory Comprehension: 6-Follows complex conversation/direction: With extra time/assistive device  Expression Expression Mode: Verbal Expression: 6-Expresses complex ideas:  With extra time/assistive device  Social Interaction Social Interaction: 6-Interacts appropriately with others with medication or extra time (anti-anxiety, antidepressant).  Problem Solving Problem Solving: 5-Solves complex 90% of the time/cues < 10% of the time  Memory Memory: 6-More than reasonable amt of time  Medical Problem List and Plan:  1. DVT Prophylaxis/Anticoagulation: Pharmaceutical: Lovenox  2. Chronic bilateral shoulder pain/Pain Management: Will use oxycodone prn  3. H/o depression/Mood: High levels of anxiety improving per reports. Continue cymbalta.  Team to provide ego support. LCSW to follow for support and evaluation.  4. Neuropsych: This patient is capable of making decisions on her own behalf.  5. Dyslipidemia: On pravastatin.  6. Hypothyroid: Supplement recently increased due to low TSH.  7. Nausea: Will add pepcid as has hx of GERD.  8. Tobacco abuse: will start nicotine patch.    LOS (Days) 2 A FACE TO FACE EVALUATION WAS PERFORMED  Makynzie Dobesh E 03/07/2013, 6:35 AM

## 2013-03-07 NOTE — Progress Notes (Signed)
Physical Therapy Session Note  Patient Details  Name: [name] MRN: [id number] Date of Birth: June 19, 1935  Today's Date: 03/07/2013 Time: 0930-1000 and 13:45-14:14 (60min)  Time Calculation (min): 30 min  Short Term Goals: Week 1:  PT Short Term Goal 1 (Week 1): STG=LTG due to short LOS  Skilled Therapeutic Interventions/Progress Updates:  1:2 Tx focused on NMR and gait training without device, as well as stairs.  Pt up in [facility], propelled x150' with bil LEs for bil coordination and L timing. Min A needed for avoiding obstacle.  Pt needs safety cues for WC brake use before transfers.  NMR in standing to develop increased L-sided weight shifting and sustained weight-shifting with full L hip ext. Perfored following tasks with mirror for visual feedback: lateral weight shifting, sustained RLE on block with upright posture, step-taps x10 each R/LLE, and static stance.   Gait in controlled setting and with mirror 1x120' and 2x25' with min A for steadying, manual facilitation for weight shifting. Pt'g gait marked by Trendelenburg pattern on L as well as LLE abduction and ER. Pt able to adjust gait pattern somewhat with mirror, but unable to sustain.  Ascend/descended stairs x5 with bil rails and min A, needing cues for safe sequence.   2:2 Tx focused on NMR for LE coordination and balance, as well as gait without device and with straight cane.  Pt propelled WC as this morning with S only.  Transfers with min A and safety cues needed for brakes.  Supine bridging for LLE extensor activation x20 as well as lower trunk rotation x10.  Dynamic sitting and standing balance with S sitting and min A standing for reaching/placing outside BOS, esp to promote L-forced use.  Gait training without device x200' with min A and manual facilitation for L weight shift to increase stance time as well as L hand-hold to promote ext as it rises into flexed position. Gait training also with straight cane on R x100'  with min A as well and not much improvement in gait quality, however pt reported less fear of falling.  Pt brought back to room, reminded to call before needing to use bathroom. All needs in reach.       Therapy Documentation Precautions:  Precautions Precautions: Fall Precaution Comments: LUE weakness, premorbid bilateral rotator cuff issues (3 surgeries to each shoulder), left inattention Restrictions Weight Bearing Restrictions: No    Pain: None      Locomotion : Ambulation Ambulation/Gait Assistance: 4: Min assist Wheelchair Mobility Distance: 150   See FIM for current functional status  Therapy/Group: Individual Therapy  Kennieth Rad, PT, DPT   Dwaine Deter, [place] 03/07/2013, 10:19 AM

## 2013-03-08 ENCOUNTER — Inpatient Hospital Stay (HOSPITAL_COMMUNITY): Payer: Medicare HMO | Admitting: Speech Pathology

## 2013-03-08 ENCOUNTER — Inpatient Hospital Stay (HOSPITAL_COMMUNITY): Payer: Medicare HMO | Admitting: Occupational Therapy

## 2013-03-08 ENCOUNTER — Inpatient Hospital Stay (HOSPITAL_COMMUNITY): Payer: Medicare HMO | Admitting: Physical Therapy

## 2013-03-08 MED ORDER — SENNOSIDES-DOCUSATE SODIUM 8.6-50 MG PO TABS
2.0000 | ORAL_TABLET | Freq: Two times a day (BID) | ORAL | Status: DC
  Administered 2013-03-08 (×2): 2 via ORAL
  Filled 2013-03-08 (×5): qty 2

## 2013-03-08 NOTE — Progress Notes (Signed)
Speech Language Pathology Daily Session Note  Patient Details  Name: ERIELLE GAWRONSKI MRN: [id number] Date of Birth: 09-Mar-1935  Today's Date: 03/08/2013 Time: 1200-1230 Time Calculation (min): 30 min  Short Term Goals: Week 1: SLP Short Term Goal 1 (Week 1): Patient will identify 3 word retreival strategies with Mod I SLP Short Term Goal 2 (Week 1): Patient will utilize word retreival strategies during structured activities with Min verbal cues SLP Short Term Goal 3 (Week 1): Patient will utilize word retreival stategies during open ended conversations with Supervision level verbal cues  Skilled Therapeutic Interventions: Therapeutic intervention complete with short term goals addressed.  The patient required maximum verbal cues to identify three word retrievel strategies to assist with expressive aphasia.  She was, however, able to implement those strategies during conversational speech with min A verbal cues.  Continue with current treatment plan.  Word recall required supervison only, when using 2D objects during conversational speech.     FIM:  Comprehension Comprehension Mode: Auditory Comprehension: 6-Follows complex conversation/direction: With extra time/assistive device Expression Expression Mode: Verbal Expression: 5-Expresses basic needs/ideas: With extra time/assistive device  Pain Pain Assessment Pain Assessment: No/denies pain  Therapy/Group: Individual Therapy  Frances Maywood 03/08/2013, 4:16 PM

## 2013-03-08 NOTE — Progress Notes (Signed)
Physical Therapy Note  Patient Details  Name: DENISIA HARPOLE MRN: [id number] Date of Birth: 05/04/1935 Today's Date: 03/08/2013  1000-1055 (55 minutes) individual Pain: no reported pain Focus of treatment: gait training with /without AD; therapeutic activities focused on dynamic standing balance; wc mobility Treatment: Pt up in wc upon arrival; wc mobility - 120 feet room to gym SBA with increased time using bilateral LEs; gait 80 feet without AD min assist with increased time / fall risk; gait 80 feet with RW close SBA with improved step length bilaterally; standing stepping to target on floor; standing stepping over obstacle ( can step over yardstick but unable to step over larger object) min assist for safety (decreased single leg stance time bilaterally) ; alternate stepping to 4 inch step X 10 with increased time; stepping up to 4 inch step min assist X 10 ; gait to room with RW 120 feet SBA; returned to room with all needs within reach.  [id number] (55 minutes) individual Pain: no reported pain Focus of treatment:  Gait to/from room to gym 120 feet X 2 RW close SBA; dynamic standing balance - ring toss X 3 using left UE (pt has difficulty releasing ring) close SBA; hitting large ball using bilateral UE clasped together to facilitate left elbow extension; gait kicking kick box with decreased kick on left; returned to room with son present.    Juno Alers,JIM 03/08/2013, 10:42 AM

## 2013-03-08 NOTE — Progress Notes (Signed)
Occupational Therapy Session Note  Patient Details  Name: [name] MRN: [id number] Date of Birth: 1935/03/15  Today's Date: 03/08/2013 Time: 0815-0905 Time Calculation (min): 50 min  Skilled Therapeutic Interventions/Progress Updates: Patient self feeding breakfast upon approach for therapy.  Afterwards patient completed shower in room via tub transfer bench and grab bars with close S for the transfer to/fr w/c and the actual shower with overall close S for safety while standing to wash buttocks in the wet shower.  Focus today on safety for standing in shower and donning socks over feet - patient able to don the socks independently with minimal extra time for L foot after the sock got caught on her left baby toe nail.     Therapy Documentation Precautions:  Precautions Precautions: Fall Precaution Comments: LUE weakness, premorbid bilateral rotator cuff issues (3 surgeries to each shoulder), left inattention Restrictions Weight Bearing Restrictions: No  Pain:denied See FIM for current functional status  Therapy/Group: Individual Therapy  Alfredia Ferguson [facility] 03/08/2013, 4:37 PM

## 2013-03-08 NOTE — Progress Notes (Signed)
78 year old left handed female with history of TIA, CAS, bilateral symptomatic subclavian artery stenosis who was admitted to [facility] on 02/27/13 for thoracic aortogram, RUE angiogram and PTA of R-SA. Post op with waxing and waning symptoms of speech difficulties as well as left sided weakness. CT head done and negative for acute changes. MRI not done due to hx of metal in eye. Cardiac echo with EF 60-65% and no wall abnormality  Subjective/Complaints: Constipation despite prune juice  Review of Systems - Negative except left hand weakness  Objective: Vital Signs: Blood pressure 163/57, pulse 82, temperature 97.8 F (36.6 C), temperature source Oral, resp. rate 19, height 5\' 1"  (1.549 m), weight 60.2 kg (132 lb 11.5 oz), SpO2 95.00%. No results found. Results for orders placed during the hospital encounter of 03/05/13 (from the past 72 hour(s))  MRSA PCR SCREENING     Status: None   Collection Time    03/05/13  3:42 PM      Result Value Range   MRSA by PCR NEGATIVE  NEGATIVE   Comment:            The GeneXpert MRSA Assay (FDA     approved for NASAL specimens     only), is one component of a     comprehensive MRSA colonization     surveillance program. It is not     intended to diagnose MRSA     infection nor to guide or     monitor treatment for     MRSA infections.  CBC WITH DIFFERENTIAL     Status: Abnormal   Collection Time    03/06/13  4:54 AM      Result Value Range   WBC 4.7  4.0 - 10.5 K/uL   RBC 3.86 (*) 3.87 - 5.11 MIL/uL   Hemoglobin 12.7  12.0 - 15.0 g/dL   HCT 03/08/13  93.5 - 70.1 %   MCV 96.1  78.0 - 100.0 fL   MCH 32.9  26.0 - 34.0 pg   MCHC 34.2  30.0 - 36.0 g/dL   RDW 77.9  39.0 - 30.0 %   Platelets 284  150 - 400 K/uL   Neutrophils Relative % 56  43 - 77 %   Neutro Abs 2.7  1.7 - 7.7 K/uL   Lymphocytes Relative 30  12 - 46 %   Lymphs Abs 1.4  0.7 - 4.0 K/uL   Monocytes Relative 12  3 - 12 %   Monocytes Absolute 0.5  0.1 - 1.0 K/uL   Eosinophils Relative 2  0 -  5 %   Eosinophils Absolute 0.1  0.0 - 0.7 K/uL   Basophils Relative 0  0 - 1 %   Basophils Absolute 0.0  0.0 - 0.1 K/uL  COMPREHENSIVE METABOLIC PANEL     Status: Abnormal   Collection Time    03/06/13  4:54 AM      Result Value Range   Sodium 139  137 - 147 mEq/L   Potassium 4.7  3.7 - 5.3 mEq/L   Chloride 100  96 - 112 mEq/L   CO2 28  19 - 32 mEq/L   Glucose, Bld 96  70 - 99 mg/dL   BUN 15  6 - 23 mg/dL   Creatinine, Ser 03/08/13  0.50 - 1.10 mg/dL   Calcium 8.2 (*) 8.4 - 10.5 mg/dL   Total Protein 5.7 (*) 6.0 - 8.3 g/dL   Albumin 2.8 (*) 3.5 - 5.2 g/dL   AST  17  0 - 37 U/L   ALT 14  0 - 35 U/L   Alkaline Phosphatase 48  39 - 117 U/L   Total Bilirubin 0.3  0.3 - 1.2 mg/dL   GFR calc non Af Amer 88 (*) >90 mL/min   GFR calc Af Amer >90  >90 mL/min   Comment: (NOTE)     The eGFR has been calculated using the CKD EPI equation.     This calculation has not been validated in all clinical situations.     eGFR's persistently <90 mL/min signify possible Chronic Kidney     Disease.     HEENT: normal Cardio: RRR and no murmurs Resp: CTA B/L and unlabored GI: BS positive and ND Extremity:  No Edema Skin:   Other healed knee and abd incisons Neuro: Alert/Oriented, Cranial Nerve II-XII normal, Normal Sensory, Abnormal Motor 3-/5 Left delt, 3- bi, tri, grip, 4/5 Bilateral HF, KE, ADF, 5/5 in RUE, Abnormal FMC Ataxic/ dec FMC and Reflexes: 3+ Musc/Skel:  Other decreased LUE shoulder ROM Gen NAD   Assessment/Plan: 1. Functional deficits secondary to probable R subcortical  infarct  Causing LUE weakness which require 3+ hours per day of interdisciplinary therapy in a comprehensive inpatient rehab setting. Physiatrist is providing close team supervision and 24 hour management of active medical problems listed below. Physiatrist and rehab team continue to assess barriers to discharge/monitor patient progress toward functional and medical goals. FIM: FIM - Bathing Bathing Steps Patient  Completed: Chest;Left Arm;Abdomen;Front perineal area;Right upper leg;Left upper leg;Right lower leg (including foot);Left lower leg (including foot);Right Arm;Buttocks Bathing: 4: Steadying assist  FIM - Upper Body Dressing/Undressing Upper body dressing/undressing steps patient completed: Thread/unthread right bra strap;Thread/unthread left bra strap;Thread/unthread right sleeve of pullover shirt/dresss;Thread/unthread left sleeve of pullover shirt/dress;Put head through opening of pull over shirt/dress;Pull shirt over trunk Upper body dressing/undressing: 4: Min-Patient completed 75 plus % of tasks FIM - Lower Body Dressing/Undressing Lower body dressing/undressing steps patient completed: Thread/unthread right underwear leg;Thread/unthread left underwear leg;Pull underwear up/down;Thread/unthread right pants leg;Thread/unthread left pants leg;Pull pants up/down;Don/Doff left sock;Don/Doff right shoe Lower body dressing/undressing: 4: Min-Patient completed 75 plus % of tasks  FIM - Toileting Toileting steps completed by patient: Adjust clothing prior to toileting;Performs perineal hygiene Toileting Assistive Devices: Grab bar or rail for support Toileting: 3: Mod-Patient completed 2 of 3 steps  FIM - Diplomatic Services operational officer Devices: Grab bars Toilet Transfers: 4-From toilet/BSC: Min A (steadying Pt. > 75%);4-To toilet/BSC: Min A (steadying Pt. > 75%)  FIM - Bed/Chair Transfer Bed/Chair Transfer Assistive Devices: Arm rests Bed/Chair Transfer: 4: Supine > Sit: Min A (steadying Pt. > 75%/lift 1 leg);4: Sit > Supine: Min A (steadying pt. > 75%/lift 1 leg);4: Bed > Chair or W/C: Min A (steadying Pt. > 75%)  FIM - Locomotion: Wheelchair Distance: 150 Locomotion: Wheelchair: 5: Travels 150 ft or more: maneuvers on rugs and over door sills with supervision, cueing or coaxing FIM - Locomotion: Ambulation Locomotion: Ambulation Assistive Devices: Counselling psychologist Ambulation/Gait Assistance: 4: Min assist Locomotion: Ambulation: 4: Travels 150 ft or more with minimal assistance (Pt.>75%)  Comprehension Comprehension Mode: Auditory Comprehension: 6-Follows complex conversation/direction: With extra time/assistive device  Expression Expression Mode: Verbal Expression: 6-Expresses complex ideas: With extra time/assistive device  Social Interaction Social Interaction: 6-Interacts appropriately with others with medication or extra time (anti-anxiety, antidepressant).  Problem Solving Problem Solving: 6-Solves complex problems: With extra time  Memory Memory: 6-More than reasonable amt of time  Medical Problem List and  Plan:  1. DVT Prophylaxis/Anticoagulation: Pharmaceutical: Lovenox  2. Chronic bilateral shoulder pain/Pain Management: Will use oxycodone prn  3. H/o depression/Mood: High levels of anxiety improving per reports. Continue cymbalta. Team to provide ego support. LCSW to follow for support and evaluation.  4. Neuropsych: This patient is capable of making decisions on her own behalf.  5. Dyslipidemia: On pravastatin.  6. Hypothyroid: Supplement recently increased due to low TSH.  7. Nausea: Will add pepcid as has hx of GERD.  8. Tobacco abuse: will start nicotine patch.  9.  Constipation-schedule senna , supp this pm if no bm  LOS (Days) 3 A FACE TO FACE EVALUATION WAS PERFORMED  Sakib Noguez E 03/08/2013, 11:24 AM

## 2013-03-09 ENCOUNTER — Ambulatory Visit (HOSPITAL_COMMUNITY): Payer: Medicare HMO | Admitting: *Deleted

## 2013-03-09 MED ORDER — SENNOSIDES-DOCUSATE SODIUM 8.6-50 MG PO TABS
1.0000 | ORAL_TABLET | Freq: Two times a day (BID) | ORAL | Status: DC
  Administered 2013-03-10 – 2013-03-12 (×4): 1 via ORAL
  Filled 2013-03-09 (×6): qty 1

## 2013-03-09 NOTE — Plan of Care (Signed)
Problem: RH BOWEL ELIMINATION Goal: RH STG MANAGE BOWEL WITH ASSISTANCE STG Manage Bowel with min Assistance.  Outcome: Not Progressing Patients last BM 03/02/13. Patient given a Dulcolax suppository at HS. adm

## 2013-03-09 NOTE — Progress Notes (Signed)
78 year old left handed female with history of TIA, CAS, bilateral symptomatic subclavian artery stenosis who was admitted to [place] on 02/27/13 for thoracic aortogram, RUE angiogram and PTA of R-SA. Post op with waxing and waning symptoms of speech difficulties as well as left sided weakness. CT head done and negative for acute changes. MRI not done due to hx of metal in eye. Cardiac echo with EF 60-65% and no wall abnormality  Subjective/Complaints: "blow out" after prune juice , senna, bowel incontinent  Review of Systems - Negative except left hand weakness  Objective: Vital Signs: Blood pressure 139/55, pulse 81, temperature 98.6 F (37 C), temperature source Oral, resp. rate 18, height 5\' 1"  (1.549 m), weight 60.2 kg (132 lb 11.5 oz), SpO2 95.00%. No results found. No results found for this or any previous visit (from the past 72 hour(s)).   HEENT: normal Cardio: RRR and no murmurs Resp: CTA B/L and unlabored GI: BS positive and ND Extremity:  No Edema Skin:   Other healed knee and abd incisons Neuro: Alert/Oriented, Cranial Nerve II-XII normal, Normal Sensory, Abnormal Motor 3-/5 Left delt, 3- bi, tri, grip, 4/5 Bilateral HF, KE, ADF, 5/5 in RUE, Abnormal FMC Ataxic/ dec FMC and Reflexes: 3+ Musc/Skel:  Other decreased LUE shoulder ROM Gen NAD   Assessment/Plan: 1. Functional deficits secondary to probable R subcortical  infarct  Causing LUE weakness which require 3+ hours per day of interdisciplinary therapy in a comprehensive inpatient rehab setting. Physiatrist is providing close team supervision and 24 hour management of active medical problems listed below. Physiatrist and rehab team continue to assess barriers to discharge/monitor patient progress toward functional and medical goals. FIM: FIM - Bathing Bathing Steps Patient Completed: Chest;Right Arm;Left Arm;Abdomen;Front perineal area;Buttocks;Right upper leg;Left upper leg;Right lower leg (including foot);Left lower leg  (including foot) Bathing: 4: Steadying assist  FIM - Upper Body Dressing/Undressing Upper body dressing/undressing steps patient completed: Thread/unthread right bra strap;Thread/unthread left bra strap;Thread/unthread right sleeve of pullover shirt/dresss;Thread/unthread left sleeve of pullover shirt/dress;Put head through opening of pull over shirt/dress;Pull shirt over trunk;Thread/unthread right sleeve of front closure shirt/dress;Thread/unthread left sleeve of front closure shirt/dress Upper body dressing/undressing: 4: Min-Patient completed 75 plus % of tasks FIM - Lower Body Dressing/Undressing Lower body dressing/undressing steps patient completed: Thread/unthread right underwear leg;Pull pants up/down;Thread/unthread left underwear leg;Fasten/unfasten pants;Pull underwear up/down;Thread/unthread right pants leg;Thread/unthread left pants leg;Don/Doff right sock;Don/Doff left sock Lower body dressing/undressing: 4: Steadying Assist  FIM - Toileting Toileting steps completed by patient: Adjust clothing prior to toileting;Performs perineal hygiene;Adjust clothing after toileting Toileting Assistive Devices: Grab bar or rail for support Toileting: 3: Mod-Patient completed 2 of 3 steps  FIM - Radio producer Devices: Grab bars Toilet Transfers: 0-Activity did not occur  FIM - Control and instrumentation engineer Devices: Arm rests Bed/Chair Transfer: 4: Supine > Sit: Min A (steadying Pt. > 75%/lift 1 leg);4: Sit > Supine: Min A (steadying pt. > 75%/lift 1 leg);4: Bed > Chair or W/C: Min A (steadying Pt. > 75%)  FIM - Locomotion: Wheelchair Distance: 150 Locomotion: Wheelchair: 5: Travels 150 ft or more: maneuvers on rugs and over door sills with supervision, cueing or coaxing FIM - Locomotion: Ambulation Locomotion: Ambulation Assistive Devices: Journalist, newspaper Ambulation/Gait Assistance: 4: Min assist Locomotion: Ambulation: 4: Travels 150 ft  or more with minimal assistance (Pt.>75%)  Comprehension Comprehension Mode: Auditory Comprehension: 6-Follows complex conversation/direction: With extra time/assistive device  Expression Expression Mode: Verbal Expression: 6-Expresses complex ideas: With extra time/assistive device  Social Interaction Social Interaction: 6-Interacts appropriately with others with medication or extra time (anti-anxiety, antidepressant).  Problem Solving Problem Solving: 6-Solves complex problems: With extra time  Memory Memory: 6-More than reasonable amt of time  Medical Problem List and Plan:  1. DVT Prophylaxis/Anticoagulation: Pharmaceutical: Lovenox  2. Chronic bilateral shoulder pain/Pain Management: Will use oxycodone prn  3. H/o depression/Mood: High levels of anxiety improving per reports. Continue cymbalta. Team to provide ego support. LCSW to follow for support and evaluation.  4. Neuropsych: This patient is capable of making decisions on her own behalf.  5. Dyslipidemia: On pravastatin.  6. Hypothyroid: Supplement recently increased due to low TSH.  7. Nausea: Will add pepcid as has hx of GERD.  8. Tobacco abuse: will start nicotine patch.  9.  Constipation-schedule senna , try to maintain regular BMs  LOS (Days) 4 A FACE TO FACE EVALUATION WAS PERFORMED  KIRSTEINS,ANDREW E 03/09/2013, 11:16 AM

## 2013-03-10 ENCOUNTER — Inpatient Hospital Stay (HOSPITAL_COMMUNITY): Payer: Medicare HMO | Admitting: Occupational Therapy

## 2013-03-10 ENCOUNTER — Inpatient Hospital Stay (HOSPITAL_COMMUNITY): Payer: Medicare HMO | Admitting: Speech Pathology

## 2013-03-10 ENCOUNTER — Inpatient Hospital Stay (HOSPITAL_COMMUNITY): Payer: Medicare HMO

## 2013-03-10 DIAGNOSIS — G8110 Spastic hemiplegia affecting unspecified side: Secondary | ICD-10-CM

## 2013-03-10 DIAGNOSIS — F3289 Other specified depressive episodes: Secondary | ICD-10-CM

## 2013-03-10 DIAGNOSIS — I6330 Cerebral infarction due to thrombosis of unspecified cerebral artery: Secondary | ICD-10-CM

## 2013-03-10 DIAGNOSIS — F329 Major depressive disorder, single episode, unspecified: Secondary | ICD-10-CM

## 2013-03-10 NOTE — Progress Notes (Signed)
Occupational Therapy Session Note  Patient Details  Name: [name] MRN: [id number] Date of Birth: 09-14-1935  Today's Date: 03/10/2013 Time: 0900-1000 Time Calculation (min): 60 min  Short Term Goals: Week 1:  OT Short Term Goal 1 (Week 1): Bath: Supervision to include sit and stand OT Short Term Goal 2 (Week 1): UB dressing: Min assist to include bra OT Short Term Goal 3 (Week 1): LB Dressing:  Min assist to include socks and shoes OT Short Term Goal 4 (Week 1): Toileting:  Supervision  OT Short Term Goal 5 (Week 1): Vision:  Patient will attend to left hand and left visual field during BADL with no more than 2 vcs  Skilled Therapeutic Interventions/Progress Updates:  Patient sitting EOB upon arrival. Engaged in self care retraining to include shower, dress and groom.  Focused session on activity tolerance, left inattention, forced use of LUE during all tasks, slow and controlled sit><stands with anterior weight shifts with hip flexion secondary to patient tends to fall back when performing sit or stand and dynamic balance with all functional mobility during ambulation.  Therapy Documentation Precautions:  Precautions Precautions: Fall Precaution Comments: LUE weakness, premorbid bilateral rotator cuff issues (3 surgeries to each shoulder), left inattention Restrictions Weight Bearing Restrictions: No Pain: Reports pain is no different than usual as she reports rarly pain-free. ADL: See FIM for current functional status  Therapy/Group: Individual Therapy  Aldan Camey 03/10/2013, 12:39 PM

## 2013-03-10 NOTE — Progress Notes (Signed)
Speech Language Pathology Daily Session Note  Patient Details  Name: [name] MRN: [id number] Date of Birth: [birth year]  Today's Date: 03/10/2013 Time: 1050-1130 Time Calculation (min): 40 min  Short Term Goals: Week 1: SLP Short Term Goal 1 (Week 1): Patient will identify 3 word retreival strategies with Mod I SLP Short Term Goal 2 (Week 1): Patient will utilize word retreival strategies during structured activities with Min verbal cues SLP Short Term Goal 3 (Week 1): Patient will utilize word retreival stategies during open ended conversations with Supervision level verbal cues  Skilled Therapeutic Interventions: Skilled Therapeutic Intervention focused on remembering naming strategies. Patient needed MAX cues to recall strategies. She recalled 1 out of 3 with MAX verbal cues. During activity, patient needed verbal MIN cues to describe nouns. Patient would occasionally become off topic but could be redirected with a verbal cue. Focus on remembering strategies when naming objects. SLP provided external aid to help with carryover.   FIM:  Comprehension Comprehension Mode: Auditory Comprehension: 6-Follows complex conversation/direction: With extra time/assistive device Expression Expression Mode: Verbal Expression: 5-Expresses complex 90% of the time/cues < 10% of the time Social Interaction Social Interaction: 6-Interacts appropriately with others with medication or extra time (anti-anxiety, antidepressant). Problem Solving Problem Solving: 6-Solves complex problems: With extra time Memory Memory: 5-Recognizes or recalls 90% of the time/requires cueing < 10% of the time FIM - Eating Eating Activity: 0: Activity did not occur  Pain Pain Assessment Pain Assessment: No/denies pain  Therapy/Group: Individual Therapy  Lieutenant Abarca 03/10/2013, 1:20 PM

## 2013-03-10 NOTE — Progress Notes (Signed)
The skilled treatment note has been reviewed and SLP is in agreement. Ashtan Laton, M.A., CCC-SLP [phone]  

## 2013-03-10 NOTE — Progress Notes (Signed)
Physical Therapy Note  Patient Details  Name: HIKARI TRIPP MRN: [id number] Date of Birth: [birth year] Today's Date: 03/10/2013 1405-1505 50 min individual therapy No pain reported  Gait x 34' x 2 without AD, min assist, cues for L attention.  neuromuscular re-education via tactile cues, VCs, demo for = wt bearing during sit>< stand without use of UEs; trunk shortening/lengthening with wt shifting and reaching out of BOS; bil shoulder adduction in sitting; sitting posture focusing on neutral pelvic tilt; Fall Prevention I exs: toes up/down in standing- L ankle strategy limited and delayed.  Gait x 190' kicking box with L foot for L attention, L neuro re-ed, min assist, mod-max cues for staying on task.  Toilet transfer with min guard.    Pt left sitting up in w/c, all needs within reach.  Larkyn Greenberger 03/10/2013, 2:24 PM

## 2013-03-10 NOTE — Progress Notes (Signed)
78 year old left handed female with history of TIA, CAS, bilateral symptomatic subclavian artery stenosis who was admitted to [place] on 02/27/13 for thoracic aortogram, RUE angiogram and PTA of R-SA. Post op with waxing and waning symptoms of speech difficulties as well as left sided weakness. CT head done and negative for acute changes. MRI not done due to hx of metal in eye. Cardiac echo with EF 60-65% and no wall abnormality  Subjective/Complaints: Doing well overnite, no bowel c/os  Review of Systems - Negative except left hand weakness  Objective: Vital Signs: Blood pressure 114/50, pulse 68, temperature 97.9 F (36.6 C), temperature source Oral, resp. rate 18, height 5\' 1"  (1.549 m), weight 60.2 kg (132 lb 11.5 oz), SpO2 94.00%. No results found. No results found for this or any previous visit (from the past 72 hour(s)).   HEENT: normal Cardio: RRR and no murmurs Resp: CTA B/L and unlabored GI: BS positive and ND Extremity:  No Edema Skin:   Other healed knee and abd incisons Neuro: Alert/Oriented, Cranial Nerve II-XII normal, Normal Sensory, Abnormal Motor 3-/5 Left delt, 3- bi, tri, grip, 4/5 Bilateral HF, KE, ADF, 5/5 in RUE, Abnormal FMC Ataxic/ dec FMC and Reflexes: 3+ Musc/Skel:  Other decreased LUE shoulder ROM Gen NAD   Assessment/Plan: 1. Functional deficits secondary to probable R subcortical  infarct  Causing LUE weakness which require 3+ hours per day of interdisciplinary therapy in a comprehensive inpatient rehab setting. Physiatrist is providing close team supervision and 24 hour management of active medical problems listed below. Physiatrist and rehab team continue to assess barriers to discharge/monitor patient progress toward functional and medical goals. FIM: FIM - Bathing Bathing Steps Patient Completed: Chest;Right Arm;Left Arm;Abdomen;Front perineal area;Buttocks;Right upper leg;Left upper leg;Right lower leg (including foot);Left lower leg (including  foot) Bathing: 4: Steadying assist  FIM - Upper Body Dressing/Undressing Upper body dressing/undressing steps patient completed: Thread/unthread right bra strap;Thread/unthread left bra strap;Thread/unthread right sleeve of pullover shirt/dresss;Thread/unthread left sleeve of pullover shirt/dress;Put head through opening of pull over shirt/dress;Pull shirt over trunk;Thread/unthread right sleeve of front closure shirt/dress;Thread/unthread left sleeve of front closure shirt/dress Upper body dressing/undressing: 4: Min-Patient completed 75 plus % of tasks FIM - Lower Body Dressing/Undressing Lower body dressing/undressing steps patient completed: Thread/unthread right underwear leg;Pull pants up/down;Thread/unthread left underwear leg;Fasten/unfasten pants;Pull underwear up/down;Thread/unthread right pants leg;Thread/unthread left pants leg;Don/Doff right sock;Don/Doff left sock Lower body dressing/undressing: 4: Steadying Assist  FIM - Toileting Toileting steps completed by patient: Adjust clothing prior to toileting;Performs perineal hygiene;Adjust clothing after toileting Toileting Assistive Devices: Grab bar or rail for support Toileting: 3: Mod-Patient completed 2 of 3 steps  FIM - Radio producer Devices: Grab bars Toilet Transfers: 0-Activity did not occur  FIM - Control and instrumentation engineer Devices: Arm rests Bed/Chair Transfer: 4: Supine > Sit: Min A (steadying Pt. > 75%/lift 1 leg);4: Sit > Supine: Min A (steadying pt. > 75%/lift 1 leg);4: Bed > Chair or W/C: Min A (steadying Pt. > 75%)  FIM - Locomotion: Wheelchair Distance: 150 Locomotion: Wheelchair: 5: Travels 150 ft or more: maneuvers on rugs and over door sills with supervision, cueing or coaxing FIM - Locomotion: Ambulation Locomotion: Ambulation Assistive Devices: Journalist, newspaper Ambulation/Gait Assistance: 4: Min assist Locomotion: Ambulation: 4: Travels 150 ft or more with  minimal assistance (Pt.>75%)  Comprehension Comprehension Mode: Auditory Comprehension: 6-Follows complex conversation/direction: With extra time/assistive device  Expression Expression Mode: Verbal Expression: 6-Expresses complex ideas: With extra time/assistive device  Social Interaction Social  Interaction: 6-Interacts appropriately with others with medication or extra time (anti-anxiety, antidepressant).  Problem Solving Problem Solving: 6-Solves complex problems: With extra time  Memory Memory: 6-More than reasonable amt of time  Medical Problem List and Plan:  1. DVT Prophylaxis/Anticoagulation: Pharmaceutical: Lovenox  2. Chronic bilateral shoulder pain/Pain Management: Will use oxycodone prn  3. H/o depression/Mood: High levels of anxiety improving per reports. Continue cymbalta. Team to provide ego support. LCSW to follow for support and evaluation.  4. Neuropsych: This patient is capable of making decisions on her own behalf.  5. Dyslipidemia: On pravastatin.  6. Hypothyroid: Supplement recently increased due to low TSH.  7. Nausea: Will add pepcid as has hx of GERD.  8. Tobacco abuse: will start nicotine patch.  9.  Constipation-schedule senna , try to maintain regular BMs  LOS (Days) 5 A FACE TO FACE EVALUATION WAS PERFORMED  KIRSTEINS,ANDREW E 03/10/2013, 8:50 AM

## 2013-03-10 NOTE — Progress Notes (Signed)
Occupational Therapy Session Note  Patient Details  Name: [name] MRN: [id number] Date of Birth: 1935/10/25  Today's Date: 03/10/2013 Time: 1130-1200 Time Calculation (min): 30 min  Short Term Goals: Week 1:  OT Short Term Goal 1 (Week 1): Bath: Supervision to include sit and stand OT Short Term Goal 2 (Week 1): UB dressing: Min assist to include bra OT Short Term Goal 3 (Week 1): LB Dressing:  Min assist to include socks and shoes OT Short Term Goal 4 (Week 1): Toileting:  Supervision  OT Short Term Goal 5 (Week 1): Vision:  Patient will attend to left hand and left visual field during BADL with no more than 2 vcs  Skilled Therapeutic Interventions/Progress Updates:    1:1 focus on functional ambulation without a device with functional turns, standing balance, toileting with distant supervision sit to stand, [facility] with small animal figurines with decr shoulder elevation with min cuing.    Therapy Documentation Precautions:  Precautions Precautions: Fall Precaution Comments: LUE weakness, premorbid bilateral rotator cuff issues (3 surgeries to each shoulder), left inattention Restrictions Weight Bearing Restrictions: No Pain: Pain Assessment Pain Assessment: No/denies pain  See FIM for current functional status  Therapy/Group: Individual Therapy  Willeen Cass [facility] 03/10/2013, 3:23 PM

## 2013-03-11 ENCOUNTER — Inpatient Hospital Stay (HOSPITAL_COMMUNITY): Payer: Medicare HMO | Admitting: *Deleted

## 2013-03-11 ENCOUNTER — Inpatient Hospital Stay (HOSPITAL_COMMUNITY): Payer: Medicare HMO

## 2013-03-11 ENCOUNTER — Inpatient Hospital Stay (HOSPITAL_COMMUNITY): Payer: Medicare HMO | Admitting: Occupational Therapy

## 2013-03-11 DIAGNOSIS — G459 Transient cerebral ischemic attack, unspecified: Secondary | ICD-10-CM | POA: Diagnosis present

## 2013-03-11 DIAGNOSIS — I779 Disorder of arteries and arterioles, unspecified: Secondary | ICD-10-CM | POA: Diagnosis present

## 2013-03-11 DIAGNOSIS — F17200 Nicotine dependence, unspecified, uncomplicated: Secondary | ICD-10-CM | POA: Diagnosis present

## 2013-03-11 DIAGNOSIS — I739 Peripheral vascular disease, unspecified: Secondary | ICD-10-CM

## 2013-03-11 DIAGNOSIS — I771 Stricture of artery: Secondary | ICD-10-CM | POA: Diagnosis present

## 2013-03-11 NOTE — Discharge Summary (Signed)
Physician Discharge Summary  Patient ID:  COHICK MRN: [id number] DOB/AGE: [birth year] 78 y.o.  Admit date: 03/05/2013 Discharge date: 03/11/2013  Discharge Diagnoses:  Principal Problem:   CVA (cerebral infarction) Active Problems:   Pain in joint, shoulder region   Carotid artery disease   Subclavian artery stenosis   TIA (transient ischemic attack)   Tobacco use disorder   Discharged Condition: Stable  Significant Diagnostic Studies: No results found.  Labs:  Basic Metabolic Panel:  Recent Labs Lab 03/06/13 0454  NA 139  K 4.7  CL 100  CO2 28  GLUCOSE 96  BUN 15  CREATININE 0.58  CALCIUM 8.2*    CBC:  Recent Labs Lab 03/06/13 0454  WBC 4.7  NEUTROABS 2.7  HGB 12.7  HCT 37.1  MCV 96.1  PLT 284    CBG: No results found for this basename: GLUCAP,  in the last 168 hours  Brief HPI:   [name] is a 78 year old left handed female with history of TIA, CAS, bilateral symptomatic subclavian artery stenosis who was admitted to [place] on 02/27/13 for thoracic aortogram, RUE angiogram and PTA of R-SA. Post op with waxing and waning symptoms of speech difficulties as well as left sided weakness. CT head done and negative for acute changes. MRI not done due to hx of metal in eye. Cardiac echo with EF 60-65% and no wall abnormality. Evaluated by Hospitalist and neuro and patient on ASA and plavix for embolic stroke.    Hospital Course: LANEE CHAIN was admitted to rehab 03/05/2013 for inpatient therapies to consist of PT, ST and OT at least three hours five days a week. Past admission physiatrist, therapy team and rehab RN have worked together to provide customized collaborative inpatient rehab. Her blood pressures have been well controlled. Follow up labs showed renal status to be stable. Po intake has been good and she's continent of bowel and bladder.  She was started on nicotine patch to help with nicotine withdrawal symptoms.  Mood has been stable  and she has shown good motivation.  She has shown good progress and supervision is recommended due to  safety concerns.    Rehab course: During patient's stay in rehab weekly team conferences were held to monitor patient's progress, set goals and discuss barriers to discharge. Patient has had improvement in activity tolerance, balance, postural control, as well as ability to compensate for deficits. She is has had improvement in functional use LUE  and LLE awareness as well as improvement in coordination. She requires supervision for transfers and ambulation with use of a cane.  She is actively using her left hand but her strength and coordination is still limited. She is modified independent for self care tasks as well as tub/shower transfers.  She requires min cues to recall word finding strategies. She is able to solve basic problems without difficulty. Needs extra time for expression. She is    Disposition: 01-Home or Self Care  Diet: Cardiac diet.   Special Instructions: 1. Outpatient PT/OT at [facility]--03/17/13 at 1:45 PM       Future Appointments Provider Department Dept Phone   03/11/2013 2:00 PM Soundra Pilon, [place] [facility] [phone]   03/17/2013 1:00 PM [place], [place] [facility] [phone]   Joint Appt Sarajane Marek, PT [facility] [phone]       Medication List    ASK your doctor about these medications  aspirin EC 81 MG tablet  Take 81 mg by mouth daily.     Biotin 1000 MCG tablet  Take 1,000 mcg by mouth daily.     CALCIUM-VITAMIN D PO  Take 2 capsules by mouth daily. Calcium 1200mg  + Vitamin D (unknown strength)     clopidogrel 75 MG tablet  Commonly known as:  PLAVIX  Take 75 mg by mouth daily with breakfast.     diltiazem 120 MG 24 hr capsule  Commonly known as:  CARDIZEM CD  Take 120 mg by mouth every evening.     DULoxetine 60 MG  capsule  Commonly known as:  CYMBALTA  Take 60 mg by mouth daily.     Fish Oil 1200 MG Caps  Take 1,200 mg by mouth daily.     Flax Seed Oil 1000 MG Caps  Take 1 capsule by mouth daily.     Flaxseed Oil Oil  Take 1 capsule by mouth daily.     levothyroxine 88 MCG tablet  Commonly known as:  SYNTHROID, LEVOTHROID  Take 88 mcg by mouth daily.     multivitamin with minerals Tabs tablet  Take 1 tablet by mouth daily.     niacin 500 MG tablet  Take 500 mg by mouth at bedtime.     oxyCODONE-acetaminophen 10-325 MG per tablet  Commonly known as:  PERCOCET  Take 1 tablet by mouth every 6 (six) hours as needed for pain.     pravastatin 20 MG tablet  Commonly known as:  PRAVACHOL  Take 20 mg by mouth daily.     vitamin C 1000 MG tablet  Take 1,000 mg by mouth daily.       Follow-up Information   Follow up with Alonza Bogus, MD.   Specialty:  Pulmonary Disease   Contact information:   [address] [place] [address] [phone]       Follow up with Charlett Blake, MD.   Specialty:  Physical Medicine and Rehabilitation   Contact information:   [address] [place] [address] [phone]       Signed: Bary Leriche 03/11/2013, 12:28 PM

## 2013-03-11 NOTE — Progress Notes (Signed)
Occupational Therapy Session Note  Patient Details  Name: [name] MRN: [id number] Date of Birth: 11/29/1935  Today's Date: 03/11/2013 Time: 1000-1100 Time Calculation (min): 60 min  Short Term Goals: Week 1:  OT Short Term Goal 1 (Week 1): Bath: Supervision to include sit and stand OT Short Term Goal 2 (Week 1): UB dressing: Min assist to include bra OT Short Term Goal 3 (Week 1): LB Dressing:  Min assist to include socks and shoes OT Short Term Goal 4 (Week 1): Toileting:  Supervision  OT Short Term Goal 5 (Week 1): Vision:  Patient will attend to left hand and left visual field during BADL with no more than 2 vcs  Skilled Therapeutic Interventions/Progress Updates:      Pt seen for BADL retraining of toileting, bathing, and dressing with a focus on safe functional mobility, L side awareness, and LUE coordination. Pt needed cues to lock and unlock w/c, but for basic mobility of walking from chair to toilet to shower to chair in bathroom back to chair in room pt was mod I. She continues to need supervision in the room with walking as long as she will be sitting in or using her w/c to move about the room. She was mod I with basic self care today and s to step in and out of the tub using grab bars. Pt ambulated to kitchen with SPC with supervision and worked on reaching into cupboards, oven with various sized pots and pans. Recommended that pt only use cookware that she can hold with r hand only versus large casseroles or pots.  Discussed kitchen safety with preventing burns and cuts. She is actively using her Left hand but her strength and coordination is still limited.  Provided pt with med soft theraputty for L hand.  Pt's PT had arrived for her next session.  Therapy Documentation Precautions:  Precautions Precautions: Fall Precaution Comments: LUE weakness, premorbid bilateral rotator cuff issues (3 surgeries to each shoulder), left inattention Restrictions Weight Bearing  Restrictions: No   Pain: Pain Assessment Pain Assessment: No/denies pain ADL:  See FIM for current functional status  Therapy/Group: Individual Therapy  SAGUIER,JULIA 03/11/2013, 11:55 AM

## 2013-03-11 NOTE — Progress Notes (Addendum)
Physical Therapy Session Note  Patient Details  Name: [name] MRN: [id number] Date of Birth: 10-25-1935  Today's Date: 03/11/2013 Time:11:05-12:00 (31min) and  1400-1445 Time Calculation (min): 45 min  Short Term Goals: Week 1:  PT Short Term Goal 1 (Week 1): STG=LTG due to short LOS  Skilled Therapeutic Interventions/Progress Updates:  1:2 Tx focused on gait withSPC, NMR for coordination and balance, as well as stairs.   Gait with SPC 2x150' with min A>>S with safety cues, on tile and carpeted settings. Pt not limited by fatigue, but continues to walk with rigid trunk and LLE in flexed position, with decreased knee flexion in swing.   Nustep for increased timing and coordination x31min with bil LEs and LUE, level 3 with 2 rest breaks.  Static standing without UE support for ball toss x81min without rest break, including picking up object from the floor.  Stairs x15 (10 with 2 rails and 5 with 1 rail and SPC) with min A for steadying with cues for sequence.   Pt left up in chair with all needs in reach.   2:2 Granddaughter present for family training.  Pt participated in community gait and mobility 2x400' with SPC and close S over varying and uneven surfaces and inclines. Pt needed assist to safely reach elevator in time, as well as with weighted doors. Pt able to navigate busy settings and tight spaces with S with SPC. Pt ascended/descended 5 stairs with 1 rail and SPC with close S.   Pt had 2 LOB posteriorly, and was able to recover with SPC. Discussed typical community access and trouble shooting possible difficulties. Performed transfers from varying surfaces and furniture, needed one rest break.  Discussed home set-up, safety, and falls risk reduction.  Performed car transfer with S and cues for safety.  Pt left up in regular chair and all needs in reach. Pt has no further questions or concerns about DC home tomorrow. Pt was reminded of team suggestion to have 24/7 S  initially, which will more likely be intermittent. Family will be present for training in the morning.       Therapy Documentation Precautions:  Precautions Precautions: Fall Precaution Comments: LUE weakness, premorbid bilateral rotator cuff issues (3 surgeries to each shoulder), left inattention Restrictions Weight Bearing Restrictions: No Pain: none    Locomotion : Ambulation Ambulation/Gait Assistance: 5: Supervision   See FIM for current functional status  Therapy/Group:  Individual Therapy  Kennieth Rad, PT, DPT   03/11/2013, 2:47 PM

## 2013-03-11 NOTE — Patient Care Conference (Signed)
Inpatient RehabilitationTeam Conference and Plan of Care Update Date: 03/12/2013   Time: 11;00 Am    Patient Name: [name]      Medical Record Number: [id number]  Date of Birth: 03/14/1935 Sex: Female         Room/Bed: 4M10C/4M10C-02 Payor Info: Payor: [employer] MEDICARE / Plan: [employer] MEDICARE HMO/PPO / Product Type: *No Product type* /    Admitting Diagnosis: R CVA  Admit Date/Time:  03/05/2013  1:58 PM Admission Comments: No comment available   Primary Diagnosis:  CVA (cerebral infarction) Principal Problem: CVA (cerebral infarction)  Patient Active Problem List   Diagnosis Date Noted  . Carotid artery disease 03/11/2013  . Subclavian artery stenosis 03/11/2013  . TIA (transient ischemic attack) 03/11/2013  . Tobacco use disorder 03/11/2013  . CVA (cerebral infarction) 03/05/2013  . Pain in joint, shoulder region 11/01/2010    Expected Discharge Date: Expected Discharge Date: 03/12/13  Team Members Present: Physician leading conference: Dr. Alysia Penna Social Worker Present: Ovidio Kin, LCSW Nurse Present: Dorien Chihuahua, RN PT Present: Georjean Mode, PT OT Present: Antony Salmon, Roland Earl, OT SLP Present: Germain Osgood, SLP PPS Coordinator present : Daiva Nakayama, RN, CRRN     Current Status/Progress Goal Weekly Team Focus  Medical   left neglect, concerns about compliance  Complete D/C caregiver instructions and training  set up outpt f/u   Bowel/Bladder   continent of bowel and bladder with min assist; incontinent at night, uses diaper  continent of bowel and bladder with  min assist  assess incidence of incontinence   Swallow/Nutrition/ Hydration     wfl        ADL's   mod I with basic self care, simple meal prep, toilet transfers; supervision with tub transfers to step in and out of tub  mod I with BADLs, homemaking, simple meal prep  HEP for LUE, L visual awareness, Lue coordination , IADL activities   Mobility   S transfers, gait with  SPC, min A stairs with 1 rail and SPC  Mod I for all moblity, and gait: S car and stairs  Pt plans to DC tomorrow at S level with intermittent S assist.    Communication   supervision-Min A for word-finding strategies  Mod I  increase recall and utilization of strategies   Safety/Cognition/ Behavioral Observations    No safety concerns        Pain   n/a         Skin   incision rt groin healing  no dressing and continued healing of rt groin  monitor site      *See Care Plan and progress notes for long and short-term goals.  Barriers to Discharge: none    Possible Resolutions to Barriers:  D/C today    Discharge Planning/Teaching Needs:    Home with grandson who is there at night.  Needs to be mod/i level-mostly alone.  Friends to check in on her and son's also     Team Discussion:  Pt reached her goals sooner than expected.  Ready to go home today.  Will go to OP at [facility] to Treatment Plan:  Pt ready for discharge-met her goals   Continued Need for Acute Rehabilitation Level of Care: The patient requires daily medical management by a physician with specialized training in physical medicine and rehabilitation for the following conditions: Daily direction of a multidisciplinary physical rehabilitation program to ensure safe treatment while eliciting the highest outcome that is of  practical value to the patient.: Yes Daily medical management of patient stability for increased activity during participation in an intensive rehabilitation regime.: Yes Daily analysis of laboratory values and/or radiology reports with any subsequent need for medication adjustment of medical intervention for : Neurological problems;Other  Chidi Shirer, Gardiner Rhyme 03/12/2013, 1:29 PM

## 2013-03-11 NOTE — Progress Notes (Signed)
Social Work Patient ID: [name], female   DOB: [birth year], 78 y.o.   MRN: [id number] Spoke with Cole-PT and pt and compromised discharge tomorrow after therapies.  MD reports no medical issues, will prepare for discharge  Tomorrow.  Will set up OP therapies.

## 2013-03-11 NOTE — Progress Notes (Signed)
Social Work Patient ID: [name], female   DOB: 1935/10/15, 78 y.o.   MRN: [id number] Met with pt and Deborah-RN who report she has heard pt going home tomorrow.  Will find out from team if has reached her goals and ready for discharge. Have informed pt she will not be able to drive at discharge until see's PCP or follow up with Dr. Letta Pate.  She reports she would have transportation to OP at  [facility].  She has all DME.  Will touch base with Cole-PT once see's today.  Pt aware and agreeable.

## 2013-03-11 NOTE — Progress Notes (Signed)
Speech Language Pathology Daily Session Note  Patient Details  Name: [name] MRN: [id number] Date of Birth: April 23, 1935  Today's Date: 03/11/2013 Time: 3846-6599 Time Calculation (min): 30 min  Short Term Goals: Week 1: SLP Short Term Goal 1 (Week 1): Patient will identify 3 word retreival strategies with Mod I SLP Short Term Goal 2 (Week 1): Patient will utilize word retreival strategies during structured activities with Min verbal cues SLP Short Term Goal 3 (Week 1): Patient will utilize word retreival stategies during open ended conversations with Supervision level verbal cues  Skilled Therapeutic Interventions: Skilled treatment focused on linguistic goals. SLP facilitated session with Min cues to recall word-finding strategies; pt recalled 2 out of 3 strategies with Mod I. Pt participated in categorical naming task with supervision question cues for topic maintenance. Pt completed alphabetical word-generation task with supervision cues for topic maintenance. Continue plan of care.   FIM:  Comprehension Comprehension Mode: Auditory Comprehension: 5-Follows basic conversation/direction: With extra time/assistive device Expression Expression Mode: Verbal Expression: 5-Expresses basic 90% of the time/requires cueing < 10% of the time. Social Interaction Social Interaction: 6-Interacts appropriately with others with medication or extra time (anti-anxiety, antidepressant). Problem Solving Problem Solving: 5-Solves basic problems: With no assist Memory Memory: 5-Recognizes or recalls 90% of the time/requires cueing < 10% of the time  Pain Pain Assessment Pain Assessment: No/denies pain  Therapy/Group: Individual Therapy   Germain Osgood, M.A. CCC-SLP [phone]   Germain Osgood 03/11/2013, 11:01 AM

## 2013-03-11 NOTE — Progress Notes (Signed)
78 year old left handed female with history of TIA, CAS, bilateral symptomatic subclavian artery stenosis who was admitted to [facility] on 02/27/13 for thoracic aortogram, RUE angiogram and PTA of R-SA. Post op with waxing and waning symptoms of speech difficulties as well as left sided weakness. CT head done and negative for acute changes. MRI not done due to hx of metal in eye. Cardiac echo with EF 60-65% and no wall abnormality  Subjective/Complaints: Doing well overnite, no bowel c/os Asking about D/C Would like to go outside today   Review of Systems - Negative except left hand weakness  Objective: Vital Signs: Blood pressure 99/59, pulse 65, temperature 97.5 F (36.4 C), temperature source Oral, resp. rate 17, height 5\' 1"  (1.549 m), weight 60.2 kg (132 lb 11.5 oz), SpO2 93.00%. No results found. No results found for this or any previous visit (from the past 72 hour(s)).   HEENT: normal Cardio: RRR and no murmurs Resp: CTA B/L and unlabored GI: BS positive and ND Extremity:  No Edema Skin:   Other healed knee and abd incisons Neuro: Alert/Oriented, Cranial Nerve II-XII normal, Normal Sensory, Abnormal Motor 3-/5 Left delt, 3- bi, tri, grip, 4/5 Bilateral HF, KE, ADF, 5/5 in RUE, Abnormal FMC Ataxic/ dec FMC and Reflexes: 3+ Musc/Skel:  Other decreased LUE shoulder ROM Gen NAD   Assessment/Plan: 1. Functional deficits secondary to probable R subcortical  infarct  Causing LUE weakness which require 3+ hours per day of interdisciplinary therapy in a comprehensive inpatient rehab setting. Physiatrist is providing close team supervision and 24 hour management of active medical problems listed below. Physiatrist and rehab team continue to assess barriers to discharge/monitor patient progress toward functional and medical goals. Team conf in am, still req min A FIM: FIM - Bathing Bathing Steps Patient Completed: Chest;Right Arm;Left Arm;Abdomen;Front perineal area;Buttocks;Right upper  leg;Left upper leg;Right lower leg (including foot);Left lower leg (including foot) Bathing: 5: Supervision: Safety issues/verbal cues  FIM - Upper Body Dressing/Undressing Upper body dressing/undressing steps patient completed: Thread/unthread right bra strap;Thread/unthread left bra strap;Thread/unthread right sleeve of pullover shirt/dresss;Thread/unthread left sleeve of pullover shirt/dress;Put head through opening of pull over shirt/dress;Pull shirt over trunk;Thread/unthread right sleeve of front closure shirt/dress;Thread/unthread left sleeve of front closure shirt/dress;Hook/unhook bra Upper body dressing/undressing: 5: Supervision: Safety issues/verbal cues FIM - Lower Body Dressing/Undressing Lower body dressing/undressing steps patient completed: Thread/unthread right underwear leg;Pull pants up/down;Thread/unthread left underwear leg;Fasten/unfasten pants;Pull underwear up/down;Thread/unthread right pants leg;Thread/unthread left pants leg;Don/Doff right sock;Don/Doff left sock;Don/Doff right shoe;Don/Doff left shoe Lower body dressing/undressing: 4: Steadying Assist  FIM - Toileting Toileting steps completed by patient: Adjust clothing prior to toileting;Performs perineal hygiene;Adjust clothing after toileting Toileting Assistive Devices: Grab bar or rail for support Toileting: 4: Steadying assist  FIM - Radio producer Devices: Grab bars Toilet Transfers: 0-Activity did not occur  FIM - Control and instrumentation engineer Devices: Arm rests Bed/Chair Transfer: 4: Supine > Sit: Min A (steadying Pt. > 75%/lift 1 leg);4: Sit > Supine: Min A (steadying pt. > 75%/lift 1 leg);4: Bed > Chair or W/C: Min A (steadying Pt. > 75%)  FIM - Locomotion: Wheelchair Distance: 150 Locomotion: Wheelchair: 5: Travels 150 ft or more: maneuvers on rugs and over door sills with supervision, cueing or coaxing FIM - Locomotion: Ambulation Locomotion:  Ambulation Assistive Devices: Other (comment) (no AD) Ambulation/Gait Assistance: 4: Min assist Locomotion: Ambulation: 4: Travels 150 ft or more with minimal assistance (Pt.>75%)  Comprehension Comprehension Mode: Auditory Comprehension: 6-Follows complex conversation/direction: With extra  time/assistive device  Expression Expression Mode: Verbal Expression: 6-Expresses complex ideas: With extra time/assistive device  Social Interaction Social Interaction: 6-Interacts appropriately with others with medication or extra time (anti-anxiety, antidepressant).  Problem Solving Problem Solving: 6-Solves complex problems: With extra time  Memory Memory: 6-More than reasonable amt of time  Medical Problem List and Plan:  1. DVT Prophylaxis/Anticoagulation: Pharmaceutical: Lovenox  2. Chronic bilateral shoulder pain/Pain Management: Will use oxycodone prn  3. H/o depression/Mood: High levels of anxiety improving per reports. Continue cymbalta. Team to provide ego support. LCSW to follow for support and evaluation.  4. Neuropsych: This patient is capable of making decisions on her own behalf.  5. Dyslipidemia: On pravastatin.  6. Hypothyroid: Supplement recently increased due to low TSH.  7. Nausea: Will add pepcid as has hx of GERD.  8. Tobacco abuse: will start nicotine patch.  9.  Constipation-schedule senna , try to maintain regular BMs  LOS (Days) 6 A FACE TO FACE EVALUATION WAS PERFORMED  Gray Maugeri E 03/11/2013, 8:16 AM

## 2013-03-12 ENCOUNTER — Inpatient Hospital Stay (HOSPITAL_COMMUNITY): Payer: Medicare HMO | Admitting: *Deleted

## 2013-03-12 ENCOUNTER — Inpatient Hospital Stay (HOSPITAL_COMMUNITY): Payer: Medicare HMO | Admitting: Occupational Therapy

## 2013-03-12 ENCOUNTER — Inpatient Hospital Stay (HOSPITAL_COMMUNITY): Payer: Medicare HMO

## 2013-03-12 LAB — CREATININE, SERUM
CREATININE: 0.64 mg/dL (ref 0.50–1.10)
GFR calc Af Amer: >90 mL/min (ref >90)
GFR, EST NON AFRICAN AMERICAN: 85 mL/min — AB (ref >90)

## 2013-03-12 MED ORDER — LEVOTHYROXINE SODIUM 88 MCG PO TABS: 88.0000 ug | ORAL_TABLET | Freq: Every day | ORAL | Status: DC

## 2013-03-12 MED ORDER — DILTIAZEM HCL ER COATED BEADS 120 MG PO CP24: 120.0000 mg | ORAL_CAPSULE | Freq: Every evening | ORAL | Status: DC

## 2013-03-12 MED ORDER — NICOTINE 7 MG/24HR TD PT24: 7.0000 mg | MEDICATED_PATCH | TRANSDERMAL | Status: DC

## 2013-03-12 MED ORDER — CLOPIDOGREL BISULFATE 75 MG PO TABS: 75.0000 mg | ORAL_TABLET | Freq: Every day | ORAL | Status: DC

## 2013-03-12 MED ORDER — FAMOTIDINE 20 MG PO TABS: 20.0000 mg | ORAL_TABLET | Freq: Two times a day (BID) | ORAL | Status: DC

## 2013-03-12 MED ORDER — SENNOSIDES-DOCUSATE SODIUM 8.6-50 MG PO TABS: 1.0000 | ORAL_TABLET | Freq: Two times a day (BID) | ORAL | Status: DC

## 2013-03-12 NOTE — Progress Notes (Signed)
Speech Language Pathology Discharge Summary & Final Treatment Note  Patient Details  Name: [name] MRN: [id number] Date of Birth: [birth year]  Today's Date: 03/12/2013 Time: 0900-0930 Time Calculation (min): 30 min  Skilled Therapeutic Intervention: Skilled treatment focused on education, with son Ronalee Belts present for session. SLP facilitated session with education regarding current linguistic function. Pt recalled 2 out of 3 word-finding strategies with Mod I, and used an external memory aid to recall the third with supervision level assistance. Pt completed 4-step sequencing task with supervision, with Min word-finding errors noted throughout task. Pt demonstrated how she uses her various strategies with Min cues, overall communicating her basic wants/needs with Mod I. Son was engaged in session and asking questions. Both pt and husband verbalized their understanding of the information provided.  Patient has met 1 of 2 long term goals.  Patient to discharge at [facility] level.  Reasons goals not met: Pt has decided to leave the hospital earlier than projected discharge date.   Clinical Impression/Discharge Summary: Pt has met 1 out of 2 LTGs during this admission, with gains in functional communication. She is able to express basic wants/needs with Mod I, however continues to exhibit mild-moderate anomia in conversation requiring Min cues to utilize word-finding strategies. Pt has decided to leave the hospital prior to scheduled d/c date, and therefore her second goal of utilizing word-finding strategies with Mod I was not yet reached. Education has been completed with patient and son, who report that she will have 24/7 supervision upon discharge home today. Pt will benefit from brief f/u [facility] SLP to continue to maximize functional communication.  Care Partner:  Caregiver Able to Provide Assistance: Yes  Type of Caregiver Assistance:  (linguistic)  Recommendation:  Home Health SLP   Rationale for SLP Follow Up: Maximize functional communication   Equipment:  None recommended by SLP  Reasons for discharge: Discharged from hospital   Patient/Family Agrees with Progress Made and Goals Achieved: Yes   See FIM for current functional status   Germain Osgood, M.A. CCC-SLP [phone]   Germain Osgood 03/12/2013, 11:19 AM

## 2013-03-12 NOTE — Progress Notes (Signed)
Occupational Therapy Discharge Summary  Patient Details  Name: [name] MRN: [id number] Date of Birth: 1935-11-13  Today's Date: 03/12/2013  Patient has met 11 of 13 long term goals due to improved activity tolerance, improved balance, ability to compensate for deficits, functional use of  LEFT upper extremity, improved attention, improved awareness and improved coordination.  Patient to discharge at overall Modified Independent level for basic self care tasks, but needs supervision with ambulation and IADL activities.  Patient's care partner is independent to provide the necessary physical and cognitive assistance at discharge.  Family stated that they will provide 24/7 care.  Reasons goals not met:  Pt did not meet mod I goals in simple meal prep and housekeeping as she needs supervision due to occasional losses of balance due to Left inattention and distraction.  Recommendation:  Patient will benefit from ongoing skilled OT services in outpatient setting to continue to advance functional skills in the area of iADL.  Equipment: No equipment provided  Reasons for discharge: treatment goals met for OT, pt had not met all PT or SLP goals but she was anxious to discharge to home and requested to leave today.  Patient/family agrees with progress made and goals achieved: Yes  OT Discharge Precautions/Restrictions  Precautions Precautions: Fall Precaution Comments: LUE weakness, premorbid bilateral rotator cuff issues (3 surgeries to each shoulder), left inattention Restrictions Weight Bearing Restrictions: No Pain Assessment Pain Assessment: No/denies pain ADL  mod I overall with self care, supervision with tub transfers and IADLS Vision/Perception  Vision - History Patient Visual Report: No change from baseline Vision - Assessment Visual Fields: No apparent deficits Perception Perception: Impaired Inattention/Neglect: Does not attend to left visual field (L awareness has  improved greatly, but is a deficit with ambulation longer distances) Praxis Praxis: Intact  Cognition Overall Cognitive Status: Within Functional Limits for tasks assessed Arousal/Alertness: Awake/alert Orientation Level: Oriented X4 Memory: Impaired Awareness: Impaired Problem Solving: Impaired Comments: easily distracted by people and environment on R; unable to perform mobility task safely while conversing Sensation Sensation Light Touch: Appears Intact Stereognosis: Appears Intact Hot/Cold: Appears Intact Proprioception: Appears Intact Coordination Gross Motor Movements are Fluid and Coordinated: Yes Fine Motor Movements are Fluid and Coordinated: Yes Coordination and Movement Description: Pt can now open all containers and fasten bra. Finger Nose Finger Test: LUE 5x in 10 sec, RUE 9x in 10 sec Heel Shin Test: Decreased excursion and timing  Motor  Motor Motor: Hemiplegia;Motor impersistence Motor - Discharge Observations: reduced motor planning LLE  Mobility  Bed Mobility Bed Mobility:  (modified independent for all) Transfers: mod I to toilet, supervision tub  Trunk/Postural Assessment  Cervical Assessment Cervical Assessment: Exceptions to [facility] (Forward head posture. and cervical arthropolasty 4 levels) Thoracic Assessment Thoracic Assessment: Exceptions to [facility] (Kyphotic) Lumbar Assessment Lumbar Assessment:  (H/o sugery for stenosis) Postural Control Postural Control: Within Functional Limits  Balance Balance Balance Assessed: Yes Standardized Balance Assessment Standardized Balance Assessment:  (dated 03/06/13) Dynamic Sitting Balance Dynamic Sitting - Level of Assistance: 6: Modified independent (Device/Increase time) Static Standing Balance Static Standing - Level of Assistance: 5: Stand by assistance (easily distracted) Dynamic Standing Balance Dynamic Standing - Level of Assistance: 5: Stand by assistance (easily distracted)  Extremity/Trunk  Assessment RUE Assessment RUE Assessment: Within Functional Limits LUE Assessment LUE Assessment: Exceptions to [facility] LUE AROM (degrees) Left Shoulder Flexion: 120 Degrees LUE Strength Grip (lbs): 32 (right hand 30) Lateral Pinch: 8 lbs (right hand 9 lbs)  See FIM for current functional status  M[place] 03/12/2013, 12:28 PM

## 2013-03-12 NOTE — Discharge Instructions (Signed)
Inpatient Rehab Discharge Instructions  SKYELER SCALESE Discharge date and time:  03/12/13   Activities/Precautions/ Functional Status: Activity: activity as tolerated Diet: cardiac diet Wound Care: keep wound clean and dry  Functional status:  ___ No restrictions     ___ Walk up steps independently _X__ 24/7 supervision              ___ Walk up steps with assistance ___ Intermittent supervision/assistance  _X__ Bathe/dress independently _X__ Walk with cane     ___ Bathe/dress with assistance ___ Walk Independently    ___ Shower independently ___ Walk with assistance    ___ Shower with assistance _X_ No alcohol     ___ Return to work/school ________  Special Instructions:    COMMUNITY REFERRALS UPON DISCHARGE:    Outpatient: PT& OT  [place] ZOXWR:[phone] Date of Last Service:03/12/2013  Appointment Date/Time:1/26 Monday 1;00-2;30 PM  Medical Equipment/Items Ordered:HAS ALL DME     GENERAL COMMUNITY RESOURCES FOR PATIENT/FAMILY: Support Groups:[facility] SUPPORT GROUP   STROKE/TIA DISCHARGE INSTRUCTIONS SMOKING Cigarette smoking nearly doubles your risk of having a stroke & is the single most alterable risk factor  If you smoke or have smoked in the last 12 months, you are advised to quit smoking for your health.  Most of the excess cardiovascular risk related to smoking disappears within a year of stopping.  Ask you doctor about anti-smoking medications  [place] Quit Line: 1-800-QUIT NOW  Free Smoking Cessation Classes [phone]  CHOLESTEROL Know your levels; limit fat & cholesterol in your diet  Lipid Panel  No results found for this basename: chol,  trig,  hdl,  cholhdl,  vldl,  ldlcalc      Many patients benefit from treatment even if their cholesterol is at goal.  Goal: Total Cholesterol (CHOL) less than 160  Goal:  Triglycerides (TRIG) less than 150  Goal:  HDL greater than 40  Goal:  LDL (LDLCALC) less than 100   BLOOD PRESSURE  American Stroke Association blood pressure target is less that 120/80 mm/Hg  Your discharge blood pressure is:  BP: 116/57 mmHg  Monitor your blood pressure  Limit your salt and alcohol intake  Many individuals will require more than one medication for high blood pressure  DIABETES (A1c is a blood sugar average for last 3 months) Goal HGBA1c is under 7% (HBGA1c is blood sugar average for last 3 months)  Diabetes:     No results found for this basename: HGBA1C     Your HGBA1c can be lowered with medications, healthy diet, and exercise.  Check your blood sugar as directed by your physician  Call your physician if you experience unexplained or low blood sugars.  PHYSICAL ACTIVITY/REHABILITATION Goal is 30 minutes at least 4 days per week  Activity: No driving, Therapies: See above Return to work: N/A  Activity decreases your risk of heart attack and stroke and makes your heart stronger.  It helps control your weight and blood pressure; helps you relax and can improve your mood.  Participate in a regular exercise program.  Talk with your doctor about the best form of exercise for you (dancing, walking, swimming, cycling).  DIET/WEIGHT Goal is to maintain a healthy weight  Your discharge diet is: Cardiac  Thin liquids Your height is:  Height: 5\' 1"  (154.9 cm) Your current weight is: Weight: 60.2 kg (132 lb 11.5 oz) Your Body Mass Index (BMI) is:  BMI (Calculated): 25.1  Following the type of diet specifically designed for you  will help prevent another stroke.  Your goal weight range is:  132  Your goal Body Mass Index (BMI) is 19-24.  Healthy food habits can help reduce 3 risk factors for stroke:  High cholesterol, hypertension, and excess weight.  RESOURCES Stroke/Support Group:  Call [phone]   STROKE EDUCATION PROVIDED/REVIEWED AND GIVEN TO PATIENT Stroke warning signs and symptoms How to activate emergency medical system (call 911). Medications prescribed at  discharge. Need for follow-up after discharge. Personal risk factors for stroke. Pneumonia vaccine given:  Flu vaccine given:  My questions have been answered, the writing is legible, and I understand these instructions.  I will adhere to these goals & educational materials that have been provided to me after my discharge from the hospital.       My questions have been answered and I understand these instructions. I will adhere to these goals and the provided educational materials after my discharge from the hospital.  Patient/Caregiver Signature _______________________________ Date __________  Clinician Signature _______________________________________ Date __________  Please bring this form and your medication list with you to all your follow-up doctor's appointments.

## 2013-03-12 NOTE — Progress Notes (Signed)
Pt. Got d/c instructions,follow up appointments and prescriptions.Pt. Ready to go home with her son.

## 2013-03-12 NOTE — Progress Notes (Signed)
Occupational Therapy Session Note  Patient Details  Name: [name] MRN: [id number] Date of Birth: 03-14-1935  Today's Date: 03/12/2013 Time: 1000-1100 Time Calculation (min): 60 min  Short Term Goals: Week 1:  OT Short Term Goal 1 (Week 1): Bath: Supervision to include sit and stand OT Short Term Goal 2 (Week 1): UB dressing: Min assist to include bra OT Short Term Goal 3 (Week 1): LB Dressing:  Min assist to include socks and shoes OT Short Term Goal 4 (Week 1): Toileting:  Supervision  OT Short Term Goal 5 (Week 1): Vision:  Patient will attend to left hand and left visual field during BADL with no more than 2 vcs  Skilled Therapeutic Interventions/Progress Updates:      Pt seen for BADL retraining of toileting, bathing, and dressing with a focus on family education with pt's son and safety awareness with adls. Pt is now mod I with basic self care skills, because she is working in a non distracting environment. She is mod I for toilet transfers as she is able to walk short distances without supervision.  Due to distraction and  L inattention pt occasionally loses her balance with ambulating in community, unfamiliar environments and/or when she is distracted. Discussed this at length with her son who assured that there will be 24/7 supervision for her at home. Reviewed safety with tub transfers, non skid pads in tub, grab bar placement, increasing lighting in home, removing throw rugs.  Pt is encouraged to continue cooking and cleaning but with close supervision.  Recommend outpt OT to increase L hand function.  Pt resting in room at end of session.  Therapy Documentation Precautions:  Precautions Precautions: Fall Precaution Comments: LUE weakness, premorbid bilateral rotator cuff issues (3 surgeries to each shoulder), left inattention Restrictions Weight Bearing Restrictions: No      Pain: Pain Assessment Pain Assessment: No/denies pain ADL:  See FIM for current functional  status  Therapy/Group: Individual Therapy  [place] 03/12/2013, 12:19 PM

## 2013-03-12 NOTE — Progress Notes (Signed)
Social Work Discharge Note Discharge Note  The overall goal for the admission was met for:   Discharge location: Yes-HOME WITH GRANDSON AND SON CHECKING ON DAILY  Length of Stay: Yes-7 DAYS  Discharge activity level: Yes-MOD/I LEVEL  Home/community participation: Yes  Services provided included: MD, RD, PT, OT, SLP, RN, TR, Pharmacy and SW  Financial Services: Private Insurance: [employer]  Follow-up services arranged: Outpatient: [facility]  [facility] REHAB-PT & OT 1/26 1;00-2;30 PM  Comments (or additional information):PT [place]. PT PLEASED TO [place]  Patient/Family verbalized understanding of follow-up arrangements: Yes  Individual responsible for coordination of the follow-up plan: SELF & BOB-SON  Confirmed correct DME delivered: [name] 03/12/2013    [name]

## 2013-03-12 NOTE — Progress Notes (Signed)
78 year old left handed female with history of TIA, CAS, bilateral symptomatic subclavian artery stenosis who was admitted to [facility] on 02/27/13 for thoracic aortogram, RUE angiogram and PTA of R-SA. Post op with waxing and waning symptoms of speech difficulties as well as left sided weakness. CT head done and negative for acute changes. MRI not done due to hx of metal in eye. Cardiac echo with EF 60-65% and no wall abnormality  Subjective/Complaints:  Slept ok Son is coming in for therapy   Review of Systems - Negative except left hand weakness  Objective: Vital Signs: Blood pressure 116/57, pulse 73, temperature 97 F (36.1 C), temperature source Oral, resp. rate 18, height 5\' 1"  (1.549 m), weight 60.2 kg (132 lb 11.5 oz), SpO2 93.00%. No results found. No results found for this or any previous visit (from the past 72 hour(s)).   HEENT: normal Cardio: RRR and no murmurs Resp: CTA B/L and unlabored GI: BS positive and ND Extremity:  No Edema Skin:   Other healed knee and abd incisons Neuro: Alert/Oriented, Cranial Nerve II-XII normal, Normal Sensory, Abnormal Motor 4-/5 Left delt, 4- bi, tri, grip, 4/5 Bilateral HF, KE, ADF, 5/5 in RUE, Abnormal FMC Ataxic/ dec FMC and Reflexes: 3+ Musc/Skel:  Other decreased LUE shoulder ROM Gen NAD   Assessment/Plan: 1. Functional deficits secondary to probable R subcortical  infarct  Causing LUE weakness which require 3+ hours per day of interdisciplinary therapy in a comprehensive inpatient rehab setting. Physiatrist is providing close team supervision and 24 hour management of active medical problems listed below. Physiatrist and rehab team continue to assess barriers to discharge/monitor patient progress toward functional and medical goals. Stable for D/C today F/u PCP in 1-2 weeks F/u PM&R 3 weeks See D/C summary See D/C instructions FIM: FIM - Bathing Bathing Steps Patient Completed: Chest;Right Arm;Left Arm;Abdomen;Front perineal  area;Buttocks;Right upper leg;Left upper leg;Right lower leg (including foot);Left lower leg (including foot) Bathing: 6: More than reasonable amount of time  FIM - Upper Body Dressing/Undressing Upper body dressing/undressing steps patient completed: Thread/unthread right bra strap;Thread/unthread left bra strap;Thread/unthread right sleeve of pullover shirt/dresss;Thread/unthread left sleeve of pullover shirt/dress;Put head through opening of pull over shirt/dress;Pull shirt over trunk;Thread/unthread right sleeve of front closure shirt/dress;Thread/unthread left sleeve of front closure shirt/dress;Hook/unhook bra Upper body dressing/undressing: 6: More than reasonable amount of time FIM - Lower Body Dressing/Undressing Lower body dressing/undressing steps patient completed: Thread/unthread right underwear leg;Pull pants up/down;Thread/unthread left underwear leg;Fasten/unfasten pants;Pull underwear up/down;Thread/unthread right pants leg;Thread/unthread left pants leg;Don/Doff right sock;Don/Doff left sock;Don/Doff right shoe;Don/Doff left shoe Lower body dressing/undressing: 6: More than reasonable amount of time  FIM - Toileting Toileting steps completed by patient: Adjust clothing prior to toileting;Performs perineal hygiene;Adjust clothing after toileting Toileting Assistive Devices: Grab bar or rail for support Toileting: 6: More than reasonable amount of time  FIM - Radio producer Devices: Oncologist Transfers: 6-More than reasonable amt of time  FIM - Control and instrumentation engineer Devices: Arm rests;Cane Bed/Chair Transfer: 5: Bed > Chair or W/C: Supervision (verbal cues/safety issues);5: Chair or W/C > Bed: Supervision (verbal cues/safety issues)  FIM - Locomotion: Wheelchair Distance: 150 Locomotion: Wheelchair: 0: Activity did not occur FIM - Locomotion: Ambulation Locomotion: Ambulation Assistive Devices: Research scientist (physical sciences) Ambulation/Gait Assistance: 5: Supervision Locomotion: Ambulation: 5: Travels 150 ft or more with supervision/safety issues  Comprehension Comprehension Mode: Auditory Comprehension: 6-Follows complex conversation/direction: With extra time/assistive device  Expression Expression Mode: Verbal Expression: 6-Expresses complex ideas: With extra  time/assistive device  Social Interaction Social Interaction: 6-Interacts appropriately with others with medication or extra time (anti-anxiety, antidepressant).  Problem Solving Problem Solving: 6-Solves complex problems: With extra time  Memory Memory: 6-More than reasonable amt of time  Medical Problem List and Plan:  1. DVT Prophylaxis/Anticoagulation: Pharmaceutical: Lovenox  2. Chronic bilateral shoulder pain/Pain Management: Will use oxycodone prn  3. H/o depression/Mood: High levels of anxiety improving per reports. Continue cymbalta. Team to provide ego support. LCSW to follow for support and evaluation.  4. Neuropsych: This patient is capable of making decisions on her own behalf.  5. Dyslipidemia: On pravastatin.  6. Hypothyroid: Supplement recently increased due to low TSH.  7. Nausea: Will add pepcid as has hx of GERD.  8. Tobacco abuse: will start nicotine patch.  9.  Constipation-schedule senna , try to maintain regular BMs  LOS (Days) 7 A FACE TO FACE EVALUATION WAS PERFORMED  [name] E 03/12/2013, 6:58 AM

## 2013-03-12 NOTE — Progress Notes (Addendum)
Physical Therapy Discharge Summary  Patient Details  Name: [name] MRN: [id number] Date of Birth: [birth year]  Today's Date: 03/12/2013 Time: 1120-1200 Time Calculation (min): 40 min  Patient has met 3 of 9 long term goals due to improved balance, increased strength, ability to compensate for deficits and functional use of  left upper extremity and left lower extremity.  Patient to discharge at an ambulatory level Supervision.   Patient's care partner is independent to provide the necessary cognitive assistance at discharge. Family has arranged for 24/7 supervision with family and neighbors initially at d/c.  Reasons goals not met: pt insisted on discharging earlier than recommended by PT   Recommendation:  Patient will benefit from ongoing skilled PT services in outpatient setting to continue to advance safe functional mobility, address ongoing impairments in motor control, attention, memory,strength, balance, activity tolerance and minimize fall risk.  Equipment: No equipment provided  Reasons for discharge: discharge from hospital- pt choice  Patient/family agrees with progress made and goals achieved: Yes  PT Discharge  Treatment today: family ed with son for gait on tile and carpet, gait while transporting items withoutAD, steps, basic and floor transfers, discussion of car transfers, cognitive issues of reduced memory, distractibility and mild L inattention.     Pt reported previously using a small child's chair to assist with floor> sit.  Today she was unable to problem solve or motor plan this, despite extra time, and cues, and finally required total assist to get up from the floor mat.  Therapist discussed this safety concern at length with pt and son.  Precautions/Restrictions Precautions Precautions: Fall Precaution Comments: LUE weakness, premorbid bilateral rotator cuff issues (3 surgeries to each shoulder), left inattention Restrictions Weight Bearing  Restrictions: No   Pain Pain Assessment Pain Assessment: No/denies pain Vision - History Patient Visual Report: No change from baseline Vision - Assessment Visual Fields: No apparent deficits Perception Perception: Impaired Inattention/Neglect: Does not attend to left visual field (L awareness has improved greatly, but is a deficit with ambulation longer distances) Praxis Praxis: Intact  Cognition Overall Cognitive Status: Within Functional Limits for tasks assessed Arousal/Alertness: Awake/alert Orientation Level: Oriented X4 Memory: Impaired Awareness: Impaired Problem Solving: Impaired Comments: easily distracted by people and environment on R; unable to perform mobility task safely while conversing Sensation Sensation Light Touch: Appears Intact Stereognosis: Appears Intact Hot/Cold: Appears Intact Proprioception: Appears Intact Coordination Gross Motor Movements are Fluid and Coordinated: Yes Fine Motor Movements are Fluid and Coordinated: Yes Coordination and Movement Description: Pt can now open all containers and fasten bra. Finger Nose Finger Test: LUE 5x in 10 sec, RUE 9x in 10 sec Heel Shin Test: Decreased excursion and timing  Motor  Motor Motor: Hemiplegia;Motor impersistence Motor - Discharge Observations: reduced motor planning LLE  Mobility Bed Mobility Bed Mobility:  (modified independent for all) Transfers Transfers: Yes (floor transfer with total assist) Sit to Stand: 5: Supervision Stand to Sit: 5: Supervision Stand Pivot Transfers: 5: Supervision Stand Pivot Transfer Details (indicate cue type and reason): for safe use of WBQC; controlled descent Locomotion  Ambulation Ambulation: Yes Ambulation/Gait Assistance: 5: Supervision Ambulation Distance (Feet): 150 Feet Assistive device: Large base quad cane;Straight cane Ambulation/Gait Assistance Details: Verbal cues for safe use of DME/AE Gait Gait: Yes Gait Pattern: Impaired Gait Pattern:  Decreased trunk rotation;Step-through pattern;Lateral hip instability; trunk flexion with rigidity, LUE abducted and tense Gait velocity: decreased High Level Ambulation High Level Ambulation: Other high level ambulation (transporting clothers over arm during gait x 15;  wthout  AD) High Level Ambulation - Other Comments: LOB to R while transporting items without AD; recovered independently with supervision Stairs / Additional Locomotion Stairs: Yes Stairs Assistance: 5: Supervision Stairs Assistance Details: Verbal cues for sequencing Stair Management Technique: One rail Right, or LBQC Number of Stairs: 5 Height of Stairs: 5 Wheelchair Mobility Wheelchair Mobility: Yes Wheelchair Assistance: 5: Careers information officer: Both lower extermities Wheelchair Parts Management: Needs assistance Distance: 150  Trunk/Postural Assessment  Cervical Assessment Cervical Assessment: Exceptions to [facility] (Forward head posture. and cervical arthropolasty 4 levels) Thoracic Assessment Thoracic Assessment: Exceptions to [facility] (Kyphotic) Lumbar Assessment Lumbar Assessment:  (H/o sugery for stenosis) Postural Control Postural Control: Within Functional Limits  Balance Balance Balance Assessed: Yes Standardized Balance Assessment Standardized Balance Assessment:  [place] dated 03/06/13; not repeated at d/c due to pt discharging earlier than recommended  Dynamic Sitting Balance Dynamic Sitting - Level of Assistance: 6: Modified independent (Device/Increase time) Static Standing Balance Static Standing - Level of Assistance: 5: Stand by assistance (easily distracted) Dynamic Standing Balance Dynamic Standing - Level of Assistance: 5: Stand by assistance (easily distracted) Extremity Assessment  RUE Assessment RUE Assessment: Within Functional Limits LUE Assessment LUE Assessment: Exceptions to [facility] LUE AROM (degrees) Left Shoulder Flexion: 120 Degrees LUE Strength Grip (lbs): 32 (right hand  30) Lateral Pinch: 8 lbs (right hand 9 lbs) RLE Assessment RLE Assessment: Within Functional Limits LLE Assessment LLE Assessment: Exceptions to [facility] LLE Strength LLE Overall Strength Comments: grossly in sitting: hip flexion/abd/add and  knee ext 4/5; ankle DF 5/5  See FIM for current functional status  Kale Rondeau 03/12/2013, 12:32 PM

## 2013-03-17 ENCOUNTER — Ambulatory Visit (HOSPITAL_COMMUNITY)
Admit: 2013-03-17 | Discharge: 2013-03-17 | Disposition: A | Discharge status: Home / Self Care | Payer: Medicare HMO | Source: Ambulatory Visit | Attending: Pulmonary Disease | Admitting: Pulmonary Disease

## 2013-03-17 DIAGNOSIS — I69998 Other sequelae following unspecified cerebrovascular disease: Secondary | ICD-10-CM | POA: Insufficient documentation

## 2013-03-17 DIAGNOSIS — M6281 Muscle weakness (generalized): Secondary | ICD-10-CM | POA: Insufficient documentation

## 2013-03-17 DIAGNOSIS — R279 Unspecified lack of coordination: Secondary | ICD-10-CM | POA: Insufficient documentation

## 2013-03-17 DIAGNOSIS — R269 Unspecified abnormalities of gait and mobility: Secondary | ICD-10-CM | POA: Insufficient documentation

## 2013-03-17 DIAGNOSIS — Z5189 Encounter for other specified aftercare: Secondary | ICD-10-CM | POA: Insufficient documentation

## 2013-03-17 DIAGNOSIS — R29898 Other symptoms and signs involving the musculoskeletal system: Secondary | ICD-10-CM | POA: Insufficient documentation

## 2013-03-17 DIAGNOSIS — Z9181 History of falling: Secondary | ICD-10-CM | POA: Insufficient documentation

## 2013-03-17 NOTE — Evaluation (Signed)
Physical Therapy Evaluation  Patient Details  Name: [name] MRN: [id number] Date of Birth: [birth year]  Today's Date: 03/17/2013 Time: 1400-1430 PT Time Calculation (min): 30 min  Charges PT evaluation             Visit#: 1 of 8  Re-eval: 04/16/13 Assessment Diagnosis: R CVA  ( left side weakness)  Surgical Date: 02/27/13 Next MD Visit: Dr. Letta Pate MD  Prior Therapy: yes   Authorization: Medicare [employer] HMO     Authorization Time Period:    Authorization Visit#: 1 of 8   Past Medical History:  Past Medical History  Diagnosis Date  . Arthritis   . Hypertension   . PONV (postoperative nausea and vomiting)     severe n/v after every surgery  . Hypothyroidism   . Hypothyroid   . Peripheral vascular disease   . Osteoarthritis   . GERD (gastroesophageal reflux disease)   . Constipation due to pain medication   . COPD (chronic obstructive pulmonary disease)   . Depression    Past Surgical History:  Past Surgical History  Procedure Laterality Date  . Total knee arthroplasty    . Carotid stent    . Shoulder surgery    . Abdominal hysterectomy    . Cholecystectomy    . Back surgery    . Rotator cuff repair    . Eye surgery      cataract /lens both eyes  . Vascular surgery    . Joint replacement  [id number]    knees  . Anterior cervical decomp/discectomy fusion N/A 04/03/2012    Procedure: ANTERIOR CERVICAL DECOMPRESSION/DISCECTOMY FUSION 2 LEVELS;  Surgeon: Hosie Spangle, MD;  Location: [place] [facility];  Service: Neurosurgery;  Laterality: N/A;  Cervical four-five,Cervical five-six anterior cervical decompression with fusion plating and bonegraft  . Cholecystectomy      Subjective Symptoms/Limitations Symptoms: states main problem post CVA 02/27/2013  problem is left hand limited use, c/o decreased walking endurance, decreased left side strength  Pertinent History: stroke 02/27/13, went into surgery for heart stent and complications resulted in stroke , R CVA,   patient is left handed ,  hx of B knee replacements  , attended [employer] claases prior to stroke , RA  Limitations: Walking;Standing Patient Stated Goals: return to [facility] fitness class, return to driving  Pain Assessment Currently in Pain?: No/denies     Precautions/Restrictions  Precautions Precautions: Other (comment) (recent R CVA )  Balance Screening Balance Screen Has the patient fallen in the past 6 months: Yes How many times?: 1 Has the patient had a decrease in activity level because of a fear of falling? : Yes Is the patient reluctant to leave their home because of a fear of falling? : No  Prior Functioning  Home Living Additional Comments: lives alone, however grandson currently staying with her at night , Prior Function Level of Independence: Independent with basic ADLs;Independent with gait  Able to Take Stairs?: Yes Driving: Yes Vocation: Retired Leisure: Hobbies-yes (Comment) Comments: mostly microwave meals; cooks maybe twice per week per chart, patient reports going to water aerobics 4-5 times/week for exercise ([employer] fitness classes )  Cognition/Observation Cognition Overall Cognitive Status: Within Functional Limits for tasks assessed Observation/Other Assessments Observations: present to PT no walking device Other Assessments: slow gait speed   Sensation/Coordination/Flexibility/Functional Tests Coordination Gross Motor Movements are Fluid and Coordinated: Yes Functional Tests Functional Tests: FOTO 58  Functional Tests: timed up and go 18 sec   Assessment RLE Assessment RLE Assessment: Within  Functional Limits LLE Strength Left Hip Flexion: 4/5 Left Knee Flexion: 4/5 Left Knee Extension: 4/5 Left Ankle Dorsiflexion: 4/5  Exercise/Treatments Mobility/Balance  Berg Balance Test Sit to Stand: Able to stand without using hands and stabilize independently Standing Unsupported: Able to stand safely 2 minutes Sitting with Back Unsupported but Feet  Supported on Floor or Stool: Able to sit safely and securely 2 minutes Stand to Sit: Sits safely with minimal use of hands Transfers: Able to transfer safely, minor use of hands Standing Unsupported with Eyes Closed: Able to stand 10 seconds safely Standing Ubsupported with Feet Together: Able to place feet together independently and stand 1 minute safely From Standing, Reach Forward with Outstretched Arm: Can reach forward >12 cm safely (5") From Standing Position, Pick up Object from Floor: Able to pick up shoe safely and easily From Standing Position, Turn to Look Behind Over each Shoulder: Looks behind one side only/other side shows less weight shift Turn 360 Degrees: Able to turn 360 degrees safely one side only in 4 seconds or less Standing Unsupported, Alternately Place Feet on Step/Stool: Able to complete >2 steps/needs minimal assist Standing Unsupported, One Foot in Front: Needs help to step but can hold 15 seconds Standing on One Leg: Tries to lift leg/unable to hold 3 seconds but remains standing independently Total Score: 44      Physical Therapy Assessment and Plan PT Assessment and Plan Clinical Impression Statement: 78 yr old referred for PT and has OT referral with med dx of right CVA. She has decreased balance and falls risk per testing. She has greater functional limts self reported with left UE and will have OT evaluation this week. Without skilled outpatient therapy patient at risk for falls, decreased  ambulation endurance, and decreased independence with houehold ADLS  Pt will benefit from skilled therapeutic intervention in order to improve on the following deficits: Decreased activity tolerance;Decreased balance;Decreased strength;Decreased mobility Rehab Potential: Good Clinical Impairments Affecting Rehab Potential: RA  PT Frequency: Min 2X/week PT Duration: 4 weeks PT Treatment/Interventions: Functional mobility training;Therapeutic activities;Therapeutic  exercise;Patient/family education;Neuromuscular re-education;Modalities;Balance training;Gait training PT Plan: balance activities, walking enduracne activities, walking around floor obstacles, ther exercise     Goals PT Short Term Goals Time to Complete Short Term Goals: 2 weeks PT Short Term Goal 1: patient ambulate 5 min at home without rest break  no device  PT Short Term Goal 2: patient safely step on /off soft surfaces min hand support for home and community safety  PT Long Term Goals Time to Complete Long Term Goals: 4 weeks PT Long Term Goal 1: Improve BERG balance score from 44/56 to > 49/56 for decreased falls risk  PT Long Term Goal 2: Improve Time up and go to less than 17 seconds for decreased falls risk   Long Term Goal 3: Patient tolerating 15 min standing activities without required rest break for endurance during household ADLS  Long Term Goal 4: patient able to safely carry lgiht weight items and safely ambulate around floor obstacles for home safety   Problem List Patient Active Problem List   Diagnosis Date Noted  . Left leg weakness 03/17/2013  . Risk for falls 03/17/2013  . Carotid artery disease 03/11/2013  . Subclavian artery stenosis 03/11/2013  . TIA (transient ischemic attack) 03/11/2013  . Tobacco use disorder 03/11/2013  . CVA (cerebral infarction) 03/05/2013  . Pain in joint, shoulder region 11/01/2010    PT Plan of Care Consulted and Agree with Plan of Care: Patient  GP Functional Assessment Tool Used: FOTO mobility   initial 58 scoree   initial   CK, goal CJ  Functional Limitation: Mobility: Walking and moving around Mobility: Walking and Moving Around Current Status (T6144): At least 40 percent but less than 60 percent impaired, limited or restricted Mobility: Walking and Moving Around Goal Status [phone]): At least 20 percent but less than 40 percent impaired, limited or restricted  Porfirio Bollier 03/17/2013, 4:09 PM  Physician  Documentation Your signature is required to indicate approval of the treatment plan as stated above.  Please sign and either send electronically or make a copy of this report for your files and return this physician signed original.   Please mark one 1.__approve of plan  2. ___approve of plan with the following conditions.   ______________________________                                                          _____________________ Physician Signature                                                                                                             Date

## 2013-03-21 ENCOUNTER — Ambulatory Visit (HOSPITAL_COMMUNITY)
Admission: RE | Admit: 2013-03-21 | Discharge: 2013-03-21 | Disposition: A | Discharge status: Home / Self Care | Payer: Medicare HMO | Source: Ambulatory Visit | Attending: Pulmonary Disease | Admitting: Pulmonary Disease

## 2013-03-21 DIAGNOSIS — Z9181 History of falling: Secondary | ICD-10-CM

## 2013-03-21 DIAGNOSIS — R29898 Other symptoms and signs involving the musculoskeletal system: Secondary | ICD-10-CM

## 2013-03-21 DIAGNOSIS — R279 Unspecified lack of coordination: Secondary | ICD-10-CM | POA: Insufficient documentation

## 2013-03-21 DIAGNOSIS — R29818 Other symptoms and signs involving the nervous system: Secondary | ICD-10-CM | POA: Insufficient documentation

## 2013-03-21 NOTE — Progress Notes (Signed)
Physical Therapy Treatment Patient Details  Name: [name] MRN: [id number] Date of Birth: May 19, 1935  Today's Date: 03/21/2013 Time: 1300-1340 PT Time Calculation (min): 40 min Neuro re education 1300 -6644  Visit#: 2 of 8  Re-eval: 04/16/13 Assessment Diagnosis: R CVA  Surgical Date: 02/27/13 Next MD Visit: Dr. Letta Pate MD  Prior Therapy: yes   Authorization: Medicare [employer] HMO   Authorization Time Period:    Authorization Visit#: 2 of 8   Subjective: Symptoms/Limitations Symptoms: states main problem post CVA 02/27/2013  problem is left hand limited use, want to get back to drving and her indepedence  Pertinent History: stroke 02/27/13, went intosurgery for heart stent and complications resulted in stroke , R CVA,  patient is left handed ,  hx of B knee replacements  , attended [employer] claases prior to stroke , RA  Patient Stated Goals: return to [facility] fitness class, return to driving  Pain Assessment Currently in Pain?: No/denies  Precautions/Restrictions  Precautions Precautions: Fall Restrictions Weight Bearing Restrictions: No  Exercise/Treatments Focused on balance this visit for home safety and falls prevention        Balance Exercises Standing Tandem Gait: Forward;3 reps Retro Gait: 3 reps;Limitations (1 hand support ) Retro Gait Limitations: verbal cues and CGA  Sidestepping: 3 reps;Limitations (intermittent hand support ) Step Over Hurdles / Cones: forward agaility ladder 6 x intermittent hand support, verbal cues for longer steps  Other Standing Exercises: side stepping over cones (3) tapping cone before stepping over , square pattern stepping using agility ladder 2 min and walking in clinic carrying cup of water in left hand   Other Standing Exercises: standing on balance pad reaching with left hand for cones , trunk rotations 10x      Seated Other Seated Exercises: Nu step 5 min warm up with emphasis on pushing through left LE for strengthening and  muscle re education        Physical Therapy Assessment and Plan PT Assessment and Plan Clinical Impression Statement: 2nd visit, can benefit from increased training retro walking for home and community safety and needs for practice stepping on/off different surfaces such as onto soft surface  Pt will benefit from skilled therapeutic intervention in order to improve on the following deficits: Decreased activity tolerance;Decreased balance;Decreased strength;Decreased mobility Rehab Potential: Good Clinical Impairments Affecting Rehab Potential: RA  PT Frequency: Min 2X/week PT Duration: 4 weeks PT Treatment/Interventions: Functional mobility training;Therapeutic activities;Therapeutic exercise;Patient/family education;Neuromuscular re-education;Modalities;Balance training;Gait training PT Plan: balance activities, walking enduracne activities, walking around floor obstacles, ther exercise     Goals PT Short Term Goals Time to Complete Short Term Goals: 2 weeks PT Short Term Goal 1: patient ambulate 5 min at home without rest break  no device  PT Short Term Goal 1 - Progress: Progressing toward goal PT Short Term Goal 2: patient safely step on /off soft surfaces min hand support for home and community safety  PT Short Term Goal 2 - Progress: Progressing toward goal PT Long Term Goals Time to Complete Long Term Goals: 4 weeks PT Long Term Goal 1: Improve BERG balance score from 44/56 to > 49/56 for decreased falls risk  PT Long Term Goal 1 - Progress: Progressing toward goal PT Long Term Goal 2: Improve Time up and go to less than 17 seconds for decreased falls risk   PT Long Term Goal 2 - Progress: Progressing toward goal Long Term Goal 3: Patient tolerating 15 min standing activities without required rest break for endurance during  household ADLS  Long Term Goal 3 Progress: Progressing toward goal Long Term Goal 4: patient able to safely carry lgiht weight items and safely ambulate  around floor obstacles for home safety  Long Term Goal 4 Progress: Progressing toward goal  Problem List Patient Active Problem List   Diagnosis Date Noted  . Lack of coordination 03/21/2013  . Fine motor impairment 03/21/2013  . Left leg weakness 03/17/2013  . Risk for falls 03/17/2013  . Carotid artery disease 03/11/2013  . Subclavian artery stenosis 03/11/2013  . TIA (transient ischemic attack) 03/11/2013  . Tobacco use disorder 03/11/2013  . CVA (cerebral infarction) 03/05/2013  . Pain in joint, shoulder region 11/01/2010    PT - End of Session Activity Tolerance: Patient tolerated treatment well General Behavior During Therapy: [employer] for tasks assessed/performed PT Plan of Care Consulted and Agree with Plan of Care: Patient  Shivaun Bilello 03/21/2013, 4:07 PM

## 2013-03-21 NOTE — Evaluation (Signed)
Occupational Therapy Evaluation  Patient Details  Name: [name] MRN: [id number] Date of Birth: 1935-11-20  Today's Date: 03/21/2013 Time: 1350-1430 OT Time Calculation (min): 40 min Eval 1350-1420 ([id number] 2025-4270 (10')  Visit#: 1 of 12  Re-eval: 04/18/13     Authorization: [place] [employer]  Authorization Time Period: Befoer 10th visit  Authorization Visit#: 1 of 10   Past Medical History:  Past Medical History  Diagnosis Date  . Arthritis   . Hypertension   . PONV (postoperative nausea and vomiting)     severe n/v after every surgery  . Hypothyroidism   . Hypothyroid   . Peripheral vascular disease   . Osteoarthritis   . GERD (gastroesophageal reflux disease)   . Constipation due to pain medication   . COPD (chronic obstructive pulmonary disease)   . Depression    Past Surgical History:  Past Surgical History  Procedure Laterality Date  . Total knee arthroplasty    . Carotid stent    . Shoulder surgery    . Abdominal hysterectomy    . Cholecystectomy    . Back surgery    . Rotator cuff repair    . Eye surgery      cataract /lens both eyes  . Vascular surgery    . Joint replacement  [id number]    knees  . Anterior cervical decomp/discectomy fusion N/A 04/03/2012    Procedure: ANTERIOR CERVICAL DECOMPRESSION/DISCECTOMY FUSION 2 LEVELS;  Surgeon: Hosie Spangle, MD;  Location: [place] [facility];  Service: Neurosurgery;  Laterality: N/A;  Cervical four-five,Cervical five-six anterior cervical decompression with fusion plating and bonegraft  . Cholecystectomy      Subjective Symptoms/Limitations Symptoms: "I just want to be able to write my name - I get so frustrated." Pertinent History: Pt  is 78 yo left-handed female who presents to outpatient OT this date s/p righit-sided CVA. Pt states that during procedure MD visit for stent placement on Thursday, January 8th, 6237 she had complications resulting in right sided CVA. PMH: (per pt report) pt had 4  cervical disks replaced approx 1 year ago. pt indicated sensation deficits in digits 4/5.   Limitations: Writing is difficult. She is not currently driving. She has begun fastening her bra in front. Patient Stated Goals: to be able to sign my name (so somebody can read it!).  Pain Assessment Currently in Pain?: No/denies  Precautions/Restrictions  Precautions Precautions: Fall Restrictions Weight Bearing Restrictions: No  Balance Screening Balance Screen Has the patient fallen in the past 6 months: Yes How many times?: 1 ('probably') Has the patient had a decrease in activity level because of a fear of falling? : Yes Is the patient reluctant to leave their home because of a fear of falling? : No   Assessment  03/21/13 1300  Assessment  Diagnosis CVA  Surgical Date (n/a)  Next MD Visit 03/25/13 (going to re-schedule)  Prior Therapy Currently PT; Previous CIR for 1 week  Precautions  Precautions Fall  Restrictions  Weight Bearing Restrictions No  Balance Screen  Has the patient fallen in the past 6 months Yes  How many times? 1 ('probably')  Has the patient had a decrease in activity level because of a fear of falling?  Yes  Is the patient reluctant to leave their home because of a fear of falling?  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 Now hooks bra in front.  Vision - History  Baseline Vision Wears glasses only for reading  Patient Visual Report No change from baseline  Vision - Assessment  Eye Alignment WFL   Cognition  Overall Cognitive Status Within Functional Limits for tasks assessed  Arousal/Alertness Awake/alert  Orientation Level Oriented X4  Sensation  Light Touch Appears Intact  Additional Comments numb/tingly/aching w cold (R hand). Baseline 4th/5th digit change in sensation  Coordination  Gross Motor Movements are Fluid and Coordinated Yes  Fine Motor Movements are Fluid and Coordinated Yes  9 Hole Peg Test L 46.7 seconds; R 38.13 seconds (Pt became fatigued during test)  RUE Assessment  RUE Assessment X  RUE AROM (degrees)  RUE Overall AROM Comments Proximal ROM deficits from prior surgeries. Distal grossly within functional limits.   RUE Strength  Grip (lbs) 42  Lateral Pinch 8 lbs  3 Point Pinch 7 lbs  RUE Overall Strength Within Functional Limits for tasks performed;Deficits;Due to premorbid status (Proximal deficits)  LUE Assessment  LUE Assessment X  LUE AROM (degrees)  LUE Overall AROM Comments Proximal ROM deficits from prior surgeries. Distal grossly within functional limits.   LUE Strength  Grip (lbs) 45  Lateral Pinch 8 lbs  3 Point Pinch 7 lbs  LUE Overall Strength Comments Pt complained of low endurance during distal activities (writing, crocheting)  LUE Overall Strength Within Functional Limits for tasks assessed;Deficits;Due to premorbid status (proximal deficits)  Written Expression  Dominant Hand Left    Occupational Therapy Assessment and Plan OT Assessment and Plan Clinical Impression Statement: A: Pt presents to OT with decrease functional use of dominant extremity, decreased endurance, decreased coordination, and fine motor deficits. Reccommend pt would benenfit from skilled OT services to maximize functional potential/independence to return to least restrictive environemnt. Pt will benefit from skilled therapeutic intervention in order to improve on the following deficits: Impaired UE functional use;Decreased activity tolerance;Decreased  strength;Decreased coordination Rehab Potential: Good OT Frequency: Min 2X/week OT Duration: 6 weeks OT Treatment/Interventions: Self-care/ADL training;Therapeutic exercise;Neuromuscular education;Patient/family education;Therapeutic activities OT Plan: P: Skilled OT interventions to address functional endurance, coordination, use of/independence with dominant LUE. Treatment Plan: strengthening, handwriting, education, progressing HEP   Goals Home Exercise Program Pt/caregiver will Perform Home Exercise Program: For increased strengthening PT Goal: Perform Home Exercise Program - Progress: Goal set today Short Term Goals Time to Complete Short Term Goals: 3 weeks Short Term Goal 1: Pt will be educated on HEP. Short Term Goal 2: Pt will complete a fine motor activity for at least 5 minutes with no more than 1 rest break. Short Term Goal 3: Pt will copy at least 5 sentence paragraphs with fair plus legibility. Short Term Goal 4: Pt will report increased independence with bilateral hands in kitchen activities 50% of the time. Short Term Goal 5: Pt will don bra indepdently, in prior manner of donning. Long Term Goals Time to Complete Long Term Goals: 6 weeks Long Term Goal 1: Pt will return to maximum level of independence with B/IADLs and leisure activities. Long Term Goal 2: Pt will complete crocheting activity for 20 minutes with no rest breaks. Long Term Goal 3: Pt will sign name with good legibility (  line placement, line spacing) and no reports of frustration, 100% of the time. Long Term Goal 4: Pt will decrease 9-Hole peg score with LUE less than or equal to 40 seconds. Long Term Goal 5: Pt will improve her DASH score by at least 5 points.  Problem List Patient Active Problem List   Diagnosis Date Noted  . Lack of coordination 03/21/2013  . Fine motor impairment 03/21/2013  . Left leg weakness 03/17/2013  . Risk for falls 03/17/2013  . Carotid artery disease 03/11/2013  .  Subclavian artery stenosis 03/11/2013  . TIA (transient ischemic attack) 03/11/2013  . Tobacco use disorder 03/11/2013  . CVA (cerebral infarction) 03/05/2013  . Pain in joint, shoulder region 11/01/2010    End of Session Activity Tolerance: Patient tolerated treatment well General Behavior During Therapy: [facility] for tasks assessed/performed OT Plan of Care OT Home Exercise Plan: handwriting exercises, continue putty exercises OT Patient Instructions: verbalization, demonstration Consulted and Agree with Plan of Care: Patient  GO Functional Assessment Tool Used: Dash 27.27% impairment Functional Limitation: Carrying, moving and handling objects Carrying, Moving and Handling Objects Current Status (Z7673): At least 20 percent but less than 40 percent impaired, limited or restricted Carrying, Moving and Handling Objects Goal Status [phone]): At least 1 percent but less than 20 percent impaired, limited or restricted  Bea Graff, [place], OTR/L [phone]  03/21/2013, 5:06 PM  Physician Documentation Your signature is required to indicate approval of the treatment plan as stated above.  Please sign and either send electronically or make a copy of this report for your files and return this physician signed original.  Please mark one 1.__approve of plan  2. ___approve of plan with the following conditions.   ______________________________                                                          _____________________ Physician Signature                                                                                                             Date

## 2013-03-25 ENCOUNTER — Encounter: Payer: Self-pay | Admitting: Physical Medicine & Rehabilitation

## 2013-03-25 ENCOUNTER — Ambulatory Visit (HOSPITAL_BASED_OUTPATIENT_CLINIC_OR_DEPARTMENT_OTHER): Payer: Medicare HMO | Admitting: Physical Medicine & Rehabilitation

## 2013-03-25 ENCOUNTER — Encounter: Payer: Medicare HMO | Attending: Physical Medicine & Rehabilitation

## 2013-03-25 ENCOUNTER — Inpatient Hospital Stay (HOSPITAL_COMMUNITY): Admission: RE | Admit: 2013-03-25 | Payer: Medicare HMO | Source: Ambulatory Visit

## 2013-03-25 VITALS — BP 127/58 | HR 77 | Resp 14 | Ht 61.0 in | Wt 136.0 lb

## 2013-03-25 DIAGNOSIS — R29898 Other symptoms and signs involving the musculoskeletal system: Secondary | ICD-10-CM | POA: Insufficient documentation

## 2013-03-25 DIAGNOSIS — E039 Hypothyroidism, unspecified: Secondary | ICD-10-CM | POA: Insufficient documentation

## 2013-03-25 DIAGNOSIS — G8320 Monoplegia of upper limb affecting unspecified side: Secondary | ICD-10-CM

## 2013-03-25 DIAGNOSIS — K219 Gastro-esophageal reflux disease without esophagitis: Secondary | ICD-10-CM | POA: Insufficient documentation

## 2013-03-25 DIAGNOSIS — I69993 Ataxia following unspecified cerebrovascular disease: Secondary | ICD-10-CM

## 2013-03-25 DIAGNOSIS — I6350 Cerebral infarction due to unspecified occlusion or stenosis of unspecified cerebral artery: Secondary | ICD-10-CM

## 2013-03-25 DIAGNOSIS — I10 Essential (primary) hypertension: Secondary | ICD-10-CM | POA: Insufficient documentation

## 2013-03-25 DIAGNOSIS — I639 Cerebral infarction, unspecified: Secondary | ICD-10-CM

## 2013-03-25 DIAGNOSIS — I69998 Other sequelae following unspecified cerebrovascular disease: Secondary | ICD-10-CM | POA: Insufficient documentation

## 2013-03-25 DIAGNOSIS — J4489 Other specified chronic obstructive pulmonary disease: Secondary | ICD-10-CM | POA: Insufficient documentation

## 2013-03-25 DIAGNOSIS — I771 Stricture of artery: Secondary | ICD-10-CM | POA: Insufficient documentation

## 2013-03-25 DIAGNOSIS — J449 Chronic obstructive pulmonary disease, unspecified: Secondary | ICD-10-CM | POA: Insufficient documentation

## 2013-03-25 DIAGNOSIS — I739 Peripheral vascular disease, unspecified: Secondary | ICD-10-CM | POA: Insufficient documentation

## 2013-03-25 DIAGNOSIS — F17200 Nicotine dependence, unspecified, uncomplicated: Secondary | ICD-10-CM | POA: Insufficient documentation

## 2013-03-25 NOTE — Progress Notes (Signed)
Subjective:    Patient ID: [name], female    DOB: 07/24/1935, 78 y.o.   MRN: [id number] 78 year old left handed female with history of TIA, CAS, bilateral symptomatic subclavian artery stenosis who was admitted to [place] on 02/27/13 for thoracic aortogram, RUE angiogram and PTA of R-SA. Post op with waxing and waning symptoms of speech difficulties as well as left sided weakness. CT head done and negative for acute changes.  HPI Comprehensive intensive rehabilitation hospital stay at [facility] date: 03/05/2013  Discharge date: 03/11/2013  Return to home. Going to outpatient therapy at [place] with dressing and bathing. Her grandson stays with her But does not provide much assistance. No falls at home Sees primary care physician tomorrow.  Denies any new vision problems after stroke  Pain Inventory Average Pain 3 Pain Right Now 5 My pain is dull  In the last 24 hours, has pain interfered with the following? General activity 1 Relation with others 0 Enjoyment of life 1 What TIME of day is your pain at its worst? morning Sleep (in general) Fair  Pain is worse with: some activites Pain improves with: rest Relief from Meds: no pain meds  Mobility walk without assistance ability to climb steps?  yes do you drive?  yes transfers alone Do you have any goals in this area?  yes  Function retired Do you have any goals in this area?  yes  Neuro/Psych weakness numbness tingling anxiety  Prior Studies Any changes since last visit?  no  Physicians involved in your care Any changes since last visit?  no   History reviewed. No pertinent family history. History   Social History  . Marital Status: Widowed    Spouse Name: N/A    Number of Children: N/A  . Years of Education: N/A   Social History Main Topics  . Smoking status: Current Every Day Smoker -- 0.50 packs/day for 45 years    Types: Cigarettes  . Smokeless tobacco: Current  User  . Alcohol Use: No  . Drug Use: No  . Sexual Activity: No   Other Topics Concern  . None   Social History Narrative  . None   Past Surgical History  Procedure Laterality Date  . Total knee arthroplasty    . Carotid stent    . Shoulder surgery    . Abdominal hysterectomy    . Cholecystectomy    . Back surgery    . Rotator cuff repair    . Eye surgery      cataract /lens both eyes  . Vascular surgery    . Joint replacement  [id number]    knees  . Anterior cervical decomp/discectomy fusion N/A 04/03/2012    Procedure: ANTERIOR CERVICAL DECOMPRESSION/DISCECTOMY FUSION 2 LEVELS;  Surgeon: Hosie Spangle, MD;  Location: [facility];  Service: Neurosurgery;  Laterality: N/A;  Cervical four-five,Cervical five-six anterior cervical decompression with fusion plating and bonegraft  . Cholecystectomy     Past Medical History  Diagnosis Date  . Arthritis   . Hypertension   . PONV (postoperative nausea and vomiting)     severe n/v after every surgery  . Hypothyroidism   . Hypothyroid   . Peripheral vascular disease   . Osteoarthritis   . GERD (gastroesophageal reflux disease)   . Constipation due to pain medication   . COPD (chronic obstructive pulmonary disease)   . Depression    BP 127/58  Pulse 77  Resp 14  Ht  5\' 1"  (1.549 m)  Wt 136 lb (61.689 kg)  BMI 25.71 kg/m2  SpO2 97%  Opioid Risk Score:   Fall Risk Score: Moderate Fall Risk (6-13 points) (pt educated on fall risk, brochure given to pt.)    Review of Systems  Neurological: Positive for weakness and numbness.       Tingling  Psychiatric/Behavioral: The patient is nervous/anxious.   All other systems reviewed and are negative.       Objective:   Physical Exam  Motor strength is 4/5 in the left deltoid, bicep, tricep, grip 5/5 in the right deltoid, bicep, tricep, grip 5/5 in bilateral hip flexors knee extensors ankle dorsiflexors and plantar flexors Ambulates without assistive device no toe drag  were knee instability. Wide base of support Cranial nerves II through XII intact      Assessment & Plan:  1. Small subcortical infarct no visible on CT head causing left upper extremity greater than lower weakness. Improving with outpatient therapy.  Graduated return to driving instructions were provided. It is recommended that the patient first drives with another licensed driver in an empty parking lot. If the patient does well with this, and they can drive on a quiet street with the licensed driver. If the patient does well with this they can drive on a busy street with a licensed driver. If the patient does well with this, the next time out they can go by himself. For the first month after resuming driving, I recommend no nighttime or Interstate driving.   RTC one month after completing outpatient therapy

## 2013-03-25 NOTE — Patient Instructions (Signed)

## 2013-03-27 ENCOUNTER — Ambulatory Visit (HOSPITAL_COMMUNITY)
Admission: RE | Admit: 2013-03-27 | Discharge: 2013-03-27 | Disposition: A | Discharge status: Home / Self Care | Payer: Medicare HMO | Source: Ambulatory Visit | Attending: Pulmonary Disease | Admitting: Pulmonary Disease

## 2013-03-27 DIAGNOSIS — Z9181 History of falling: Secondary | ICD-10-CM | POA: Insufficient documentation

## 2013-03-27 DIAGNOSIS — M6281 Muscle weakness (generalized): Secondary | ICD-10-CM | POA: Diagnosis not present

## 2013-03-27 DIAGNOSIS — R279 Unspecified lack of coordination: Secondary | ICD-10-CM | POA: Insufficient documentation

## 2013-03-27 DIAGNOSIS — I69998 Other sequelae following unspecified cerebrovascular disease: Secondary | ICD-10-CM | POA: Diagnosis not present

## 2013-03-27 DIAGNOSIS — R269 Unspecified abnormalities of gait and mobility: Secondary | ICD-10-CM | POA: Insufficient documentation

## 2013-03-27 DIAGNOSIS — R29898 Other symptoms and signs involving the musculoskeletal system: Secondary | ICD-10-CM | POA: Insufficient documentation

## 2013-03-27 DIAGNOSIS — Z5189 Encounter for other specified aftercare: Secondary | ICD-10-CM | POA: Insufficient documentation

## 2013-03-27 NOTE — Progress Notes (Signed)
Occupational Therapy Treatment Patient Details  Name: [name] MRN: [id number] Date of Birth: [birth year]  Today's Date: 03/27/2013 Time: 7062-3762 OT Time Calculation (min): 44 min Therapeutic Exercises (59')  Visit#: 2 of 12  Re-eval: 04/18/13    Authorization: [employer]  Authorization Time Period: Before 10th visit  Authorization Visit#: 2 of 10  Subjective Symptoms/Limitations Symptoms: "I've just been feeling weak and achey." Pain Assessment Currently in Pain?: No/denies  Precautions/Restrictions     Exercise/Treatments Hand Exercises Theraputty - Flatten: red putty w left and bilateral digits and palm Theraputty - Roll: red putty w left and bilateral digits and palm, w 3/4" dowel rod Theraputty - Grip: red putty w left digits and palm Theraputty - Pinch: red putty w left digits and palm - whole hand and 3-jaw chuck Sponges: Opposition and in-hand manipulation to pick up 10 sponges. Other Hand Exercises: Red Theraputty - push holes w 3/4" dowel rod (for grip), pushing each digit into putty to make indentation Other Hand Exercises: Green resisted clothespin - 3-jaw chuck to pick up 16 sponges x2 (with rest between, and one in second set).        Occupational Therapy Assessment and Plan OT Assessment and Plan Clinical Impression Statement: A: Pt was excited to began driving yesterday after MD release. Pt tolerated well strengthening exercises, but did demonstrate fatigue at end of session, requiring rest breaks during clothespin exercises. OT Plan: P: Theraputty - finding objects in putty. Continue strengthening.   Goals Short Term Goals Short Term Goal 1: Pt will be educated on HEP. Short Term Goal 1 Progress: Progressing toward goal Short Term Goal 2: Pt will complete a fine motor activity for at least 5 minutes with no more than 1 rest break. Short Term Goal 2 Progress: Progressing toward goal Short Term Goal 3: Pt will copy at least 5 sentence  paragraphs with fair plus legibility. Short Term Goal 3 Progress: Progressing toward goal Short Term Goal 4: Pt will report increased independence with bilateral hands in kitchen activities 50% of the time. Short Term Goal 4 Progress: Progressing toward goal Short Term Goal 5: Pt will don bra independently, in prior manner of donning. Short Term Goal 5 Progress: Progressing toward goal Long Term Goals Long Term Goal 1: Pt will return to maximum level of independence with B/IADLs and leisure activities. Long Term Goal 1 Progress: Progressing toward goal Long Term Goal 2: Pt will complete crocheting activity for 20 minutes with no rest breaks. Long Term Goal 2 Progress: Progressing toward goal Long Term Goal 3: Pt will sign name with good legibility (line placement, line spacing) and no reports of frustration, 100% of the time. Long Term Goal 3 Progress: Progressing toward goal Long Term Goal 4: Pt will decrease 9-Hole peg score with LUE less than or equal to 40 seconds. Long Term Goal 4 Progress: Progressing toward goal Long Term Goal 5: Pt will improve her DASH score by at least 5 points. Long Term Goal 5 Progress: Progressing toward goal  Problem List Patient Active Problem List   Diagnosis Date Noted  . Lack of coordination 03/21/2013  . Fine motor impairment 03/21/2013  . Left leg weakness 03/17/2013  . Risk for falls 03/17/2013  . Carotid artery disease 03/11/2013  . Subclavian artery stenosis 03/11/2013  . TIA (transient ischemic attack) 03/11/2013  . Tobacco use disorder 03/11/2013  . CVA (cerebral infarction) 03/05/2013  . Pain in joint, shoulder region 11/01/2010    End of Session Activity Tolerance: Patient  tolerated treatment well General Behavior During Therapy: [facility] for tasks assessed/performed  GO    Guerry Bruin 03/27/2013, 2:59 PM

## 2013-04-01 ENCOUNTER — Ambulatory Visit (HOSPITAL_COMMUNITY)
Admission: RE | Admit: 2013-04-01 | Discharge: 2013-04-01 | Disposition: A | Discharge status: Home / Self Care | Payer: Medicare HMO | Source: Ambulatory Visit | Attending: Pulmonary Disease | Admitting: Pulmonary Disease

## 2013-04-01 DIAGNOSIS — Z5189 Encounter for other specified aftercare: Secondary | ICD-10-CM | POA: Diagnosis not present

## 2013-04-01 DIAGNOSIS — Z9181 History of falling: Secondary | ICD-10-CM

## 2013-04-01 DIAGNOSIS — R29898 Other symptoms and signs involving the musculoskeletal system: Secondary | ICD-10-CM

## 2013-04-01 NOTE — Progress Notes (Signed)
Physical Therapy Treatment Patient Details  Name: [name] MRN: [id number] Date of Birth: 12/16/1935  Today's Date: 04/01/2013 Time: 1300-1345 PT Time Calculation (min): 46 min NMR 1300 -1345  Visit#: 3 of 8  Re-eval: 04/16/13 Assessment Diagnosis: R CVA  Surgical Date: 02/27/13 Next MD Visit: Dr. Letta Pate MD  Prior Therapy: yes   Authorization: Medicare [employer]   Authorization Time Period:    Authorization Visit#: 3 of 8   Subjective: Symptoms/Limitations Symptoms: no new complaints with balance, but body achy  Pertinent History: stroke 02/27/13, went intosurgery for heart stent and complications resulted in stroke , R CVA,  patient is left handed ,  hx of B knee replacements  , attended [employer] cla[facility] prior to stroke , RA   Precautions/Restrictions     Exercise/Treatments Mobility/Balance        Balance Exercises Standing Standing, One Foot on a Step: 4 inch;Eyes open;15 secs;2 reps Retro Gait: 3 reps;Limitations Turning: 3 reps;Limitations Turning Limitations: figure 8  walking  Numbers 1-15: Foam   3 reps  Marching: Hand held assist (HHA) 1;20 reps Heel Raises: 20 reps Other Standing Exercises: side stepping 2 reps with  agility ladder, forward walking both feet in agaility lladder squares 2 reps, forward walking agaility ladder one foot in each block 2 reps , 4 in step ups 1 HHA B 10 x each  Other Standing Exercises: standing on balance pad reaching with left hand for cones , trunk rotations 10x       Seated Other Seated Exercises: Nu step  6 min warm up with emphasis on pushing through left LE for strengthening and muscle re education   Supine       Physical Therapy Assessment and Plan PT Assessment and Plan Clinical Impression Statement: 3rd visit, small reduction in timed up and go testing, verbal cues to step over strings of floor ladder instead of having low foot clearance     Goals PT Short Term Goals PT Short Term Goal 1: patient ambulate 5  min at home without rest break  no device  PT Short Term Goal 1 - Progress: Met PT Short Term Goal 2: patient safely step on /off soft surfaces min hand support for home and community safety  PT Short Term Goal 2 - Progress: Progressing toward goal PT Long Term Goals Time to Complete Long Term Goals: 4 weeks PT Long Term Goal 1: Improve BERG balance score from 44/56 to > 49/56 for decreased falls risk  PT Long Term Goal 1 - Progress: Progressing toward goal PT Long Term Goal 2: Improve Time up and go to less than 17 seconds for decreased falls risk   PT Long Term Goal 2 - Progress: Progressing toward goal Long Term Goal 3: Patient tolerating 15 min standing activities without required rest break for endurance during household ADLS  Long Term Goal 3 Progress: Progressing toward goal Long Term Goal 4: patient able to safely carry lgiht weight items and safely ambulate around floor obstacles for home safety  Long Term Goal 4 Progress: Progressing toward goal  Problem List Patient Active Problem List   Diagnosis Date Noted  . Lack of coordination 03/21/2013  . Fine motor impairment 03/21/2013  . Left leg weakness 03/17/2013  . Risk for falls 03/17/2013  . Carotid artery disease 03/11/2013  . Subclavian artery stenosis 03/11/2013  . TIA (transient ischemic attack) 03/11/2013  . Tobacco use disorder 03/11/2013  . CVA (cerebral infarction) 03/05/2013  . Pain in joint, shoulder region  11/01/2010       GP    [name] 04/01/2013, 2:00 PM

## 2013-04-01 NOTE — Progress Notes (Signed)
Occupational Therapy Treatment Patient Details  Name: [name] MRN: [id number] Date of Birth: [birth year]  Today's Date: 04/01/2013 Time: 2878-6767 OT Time Calculation (min): 38 min Therapeutic Exercises 38'  Visit#: 3 of 12  Re-eval: 04/18/13    Authorization: [employer]  Authorization Time Period: Before 10th visit  Authorization Visit#: 3 of 10  Subjective Symptoms/Limitations Symptoms: S: "Driving's going good - if I could walk as well as I drive then I'd be good." Pain Assessment Currently in Pain?: No/denies  Exercise/Treatments Hand Exercises Joint Blocking Exercises: MCP, PIP, DIP bocked flex/exten AROm - 10 reps each Theraputty - Flatten: red putty with left digits and palm Theraputty - Roll: red putty with bilateral digits and palm Hand Gripper with Large Beads: Hand gripper with black spring in 1st hole (20lbs) -removing 20 pegs from pegboard (after placeing with tripod grasp) (Pt consistently dropping pegs) Other Hand Exercises: Grasp 15 large beads, place and push into red putty. Using 1st, 2nd, 3rd digits to grasp and pull each out. x2 reps.     Occupational Therapy Assessment and Plan OT Assessment and Plan Clinical Impression Statement: A: Pt toelrated well hand exercises, but required repeated instructions and cueing for exercises. Pt had increased shoulder discomfort during hand exercises. OT Plan: P: Continue hand/digit strengthening. Hand gripper.   Goals Short Term Goals Short Term Goal 1: Pt will be educated on HEP. Short Term Goal 1 Progress: Progressing toward goal Short Term Goal 2: Pt will complete a fine motor activity for at least 5 minutes with no more than 1 rest break. Short Term Goal 2 Progress: Progressing toward goal Short Term Goal 3: Pt will copy at least 5 sentence paragraphs with fair plus legibility. Short Term Goal 3 Progress: Progressing toward goal Short Term Goal 4: Pt will report increased independence with bilateral  hands in kitchen activities 50% of the time. Short Term Goal 4 Progress: Progressing toward goal Short Term Goal 5: Pt will don bra independently, in prior manner of donning. Short Term Goal 5 Progress: Progressing toward goal Long Term Goals Long Term Goal 1: Pt will return to maximum level of independence with B/IADLs and leisure activities. Long Term Goal 1 Progress: Progressing toward goal Long Term Goal 2: Pt will complete crocheting activity for 20 minutes with no rest breaks. Long Term Goal 2 Progress: Progressing toward goal Long Term Goal 3: Pt will sign name with good legibility (line placement, line spacing) and no reports of frustration, 100% of the time. Long Term Goal 3 Progress: Progressing toward goal Long Term Goal 4: Pt will decrease 9-Hole peg score with LUE less than or equal to 40 seconds. Long Term Goal 4 Progress: Progressing toward goal Long Term Goal 5: Pt will improve her DASH score by at least 5 points. Long Term Goal 5 Progress: Progressing toward goal  Problem List Patient Active Problem List   Diagnosis Date Noted  . Lack of coordination 03/21/2013  . Fine motor impairment 03/21/2013  . Left leg weakness 03/17/2013  . Risk for falls 03/17/2013  . Carotid artery disease 03/11/2013  . Subclavian artery stenosis 03/11/2013  . TIA (transient ischemic attack) 03/11/2013  . Tobacco use disorder 03/11/2013  . CVA (cerebral infarction) 03/05/2013  . Pain in joint, shoulder region 11/01/2010    End of Session Activity Tolerance: Patient tolerated treatment well General Behavior During Therapy: [facility] for tasks assessed/performed  GO    Bea Graff, MS, OTR/L [phone]  04/01/2013, 4:19 PM

## 2013-04-04 ENCOUNTER — Ambulatory Visit (HOSPITAL_COMMUNITY)
Admission: RE | Admit: 2013-04-04 | Discharge: 2013-04-04 | Disposition: A | Discharge status: Home / Self Care | Payer: Medicare HMO | Source: Ambulatory Visit | Attending: Pulmonary Disease | Admitting: Pulmonary Disease

## 2013-04-04 DIAGNOSIS — Z5189 Encounter for other specified aftercare: Secondary | ICD-10-CM | POA: Diagnosis not present

## 2013-04-04 DIAGNOSIS — Z9181 History of falling: Secondary | ICD-10-CM

## 2013-04-04 DIAGNOSIS — R29898 Other symptoms and signs involving the musculoskeletal system: Secondary | ICD-10-CM

## 2013-04-04 NOTE — Progress Notes (Signed)
Physical Therapy Treatment Patient Details  Name: [name] MRN: [id number] Date of Birth: 10/22/1935  Today's Date: 04/04/2013 Time: 4098-1191 PT Time Calculation (min): 45 min NMR `300 - 4782  Visit#: 4 of 8  Re-eval: 04/16/13 Assessment Diagnosis: R CVA  Surgical Date: 02/27/13 Next MD Visit: Dr. Letta Pate MD  Prior Therapy: yes   Authorization: Medicare [employer]   Authorization Time Period:    Authorization Visit#: 4 of 8   Subjective: Symptoms/Limitations Symptoms: no new complaints, reports fatigue during day   Pain Assessment Currently in Pain?: No/denies      Exercise/Treatments Mobility/Balance        Balance Exercises Standing Standing, One Foot on a Step: Eyes open;15 secs;2 reps;6 inch Tandem Gait: Forward;3 reps Retro Gait: 3 reps;Limitations Retro Gait Limitations: verbal cues and CGA  Sidestepping: 3 reps;Limitations Turning Limitations: figure 8 walking  Step Over Hurdles / Cones: forward agaility ladder 6 x intermittent hand support, verbal cues for longer steps  Numbers 1-15: Foam Marching: Hand held assist (HHA) 1;20 reps Heel Raises: 20 reps Other Standing Exercises: side stepping 2 reps with  agility ladder, forward walking both feet in agility ladder squares 2 reps, forward walking agaility ladder one foot in each block 2 reps , 4 in step ups 1 HHA B 10 x each  Other Standing Exercises: standing heel raises 2 x 10, standing march 2 x 10 B HHA   Seated Other Seated Exercises: Nu step  6 min warm up with emphasis on pushing through left LE for strengthening and muscle re education      Physical Therapy Assessment and Plan PT Assessment and Plan Clinical Impression Statement: 4th visit, improved stride and step length, steping over low hurdles with supervision, improved spped of retro steps  PT Plan: balance activities, walking enduracne activities, walking around floor obstacles, ther exercise     Goals PT Short Term Goals Time to  Complete Short Term Goals: 2 weeks PT Short Term Goal 1: patient ambulate 5 min at home without rest break  no device  PT Short Term Goal 1 - Progress: Met PT Short Term Goal 2: patient safely step on /off soft surfaces min hand support for home and community safety  PT Short Term Goal 2 - Progress: Progressing toward goal PT Long Term Goals Time to Complete Long Term Goals: 4 weeks PT Long Term Goal 1: Improve BERG balance score from 44/56 to > 49/56 for decreased falls risk  PT Long Term Goal 2: Improve Time up and go to less than 17 seconds for decreased falls risk   PT Long Term Goal 2 - Progress: Progressing toward goal Long Term Goal 3: Patient tolerating 15 min standing activities without required rest break for endurance during household ADLS  Long Term Goal 3 Progress: Progressing toward goal Long Term Goal 4: patient able to safely carry lgiht weight items and safely ambulate around floor obstacles for home safety   Problem List Patient Active Problem List   Diagnosis Date Noted  . Lack of coordination 03/21/2013  . Fine motor impairment 03/21/2013  . Left leg weakness 03/17/2013  . Risk for falls 03/17/2013  . Carotid artery disease 03/11/2013  . Subclavian artery stenosis 03/11/2013  . TIA (transient ischemic attack) 03/11/2013  . Tobacco use disorder 03/11/2013  . CVA (cerebral infarction) 03/05/2013  . Pain in joint, shoulder region 11/01/2010    PT - End of Session Activity Tolerance: Patient tolerated treatment well;Patient limited by fatigue PT Plan of  Care PT Patient Instructions: written standing HEP, review next visit  Consulted and Agree with Plan of Care: Patient  GP    Johan Antonacci 04/04/2013, 2:02 PM

## 2013-04-04 NOTE — Progress Notes (Signed)
Occupational Therapy Treatment Patient Details  Name: [name] MRN: [id number] Date of Birth: 24-Feb-1935  Today's Date: 04/04/2013 Time: 1345-1430 OT Time Calculation (min): 45 min Therapeutic Exercises 1345-1355 (10') Manual 1355-1405 (10') Therapeutic Activity 1405-1430 (25')  Visit#: 4 of 12  Re-eval: 04/18/13    Authorization: [employer]  Authorization Time Period: Before 10th visit  Authorization Visit#: 4 of 10  Subjective Symptoms/Limitations Symptoms: S: "I was going to bring you some writing, but I forgot it. Its not something to be proud of." Pain Assessment Currently in Pain?: Yes Pain Score: 2  Pain Location: Shoulder Pain Orientation: Left Pain Type: Acute pain  Exercise/Treatments  Standing Flexion: 10 reps;Theraband Theraband Level (Shoulder Flexion): Level 2 (Red) Extension: 10 reps;Theraband Theraband Level (Shoulder Extension): Level 2 (Red) Other Standing Exercises: 1 lb dowel rod for 10 reps abduction  Hand Exercises Other Hand Exercises: Grasped 20 pegs and placed into peg board on table. Using left hand, and right to sbailize, pulled 15 rubberbands around pegs, then removed bands.     Manual Therapy Manual Therapy: Massage Massage: To left hand to decrease pain and tightness in order to facilitate improved fine motor coordination  Occupational Therapy Assessment and Plan OT Assessment and Plan Clinical Impression Statement: A: Pt has some shoulder pain this date - attempted some shoulder strengtheing exercises this date to facilite fine moror coordination. Will attempt further next session. Pt tolerated well rubber band/peg activity, but required cues to use right hand as stabilizer. completed activity in standing to decrease pt's tendency to wing shoulder out. OT Plan: P: proximal shoulder strengthening. Hand/digit strengthening.   Goals Short Term Goals Short Term Goal 1: Pt will be educated on HEP. Short Term Goal 1 Progress:  Progressing toward goal Short Term Goal 2: Pt will complete a fine motor activity for at least 5 minutes with no more than 1 rest break. Short Term Goal 2 Progress: Progressing toward goal Short Term Goal 3: Pt will copy at least 5 sentence paragraphs with fair plus legibility. Short Term Goal 3 Progress: Progressing toward goal Short Term Goal 4: Pt will report increased independence with bilateral hands in kitchen activities 50% of the time. Short Term Goal 4 Progress: Progressing toward goal Short Term Goal 5: Pt will don bra independently, in prior manner of donning. Short Term Goal 5 Progress: Progressing toward goal Long Term Goals Long Term Goal 1: Pt will return to maximum level of independence with B/IADLs and leisure activities. Long Term Goal 1 Progress: Progressing toward goal Long Term Goal 2: Pt will complete crocheting activity for 20 minutes with no rest breaks. Long Term Goal 2 Progress: Progressing toward goal Long Term Goal 3: Pt will sign name with good legibility (line placement, line spacing) and no reports of frustration, 100% of the time. Long Term Goal 3 Progress: Progressing toward goal Long Term Goal 4: Pt will decrease 9-Hole peg score with LUE less than or equal to 40 seconds. Long Term Goal 4 Progress: Progressing toward goal Long Term Goal 5: Pt will improve her DASH score by at least 5 points. Long Term Goal 5 Progress: Progressing toward goal  Problem List Patient Active Problem List   Diagnosis Date Noted  . Lack of coordination 03/21/2013  . Fine motor impairment 03/21/2013  . Left leg weakness 03/17/2013  . Risk for falls 03/17/2013  . Carotid artery disease 03/11/2013  . Subclavian artery stenosis 03/11/2013  . TIA (transient ischemic attack) 03/11/2013  . Tobacco use disorder 03/11/2013  .  CVA (cerebral infarction) 03/05/2013  . Pain in joint, shoulder region 11/01/2010    End of Session Activity Tolerance: Patient tolerated treatment  well General Behavior During Therapy: [facility] for tasks assessed/performed  GO    Bea Graff, MS, OTR/L [phone]  04/04/2013, 5:03 PM

## 2013-04-07 ENCOUNTER — Other Ambulatory Visit: Payer: Self-pay | Admitting: Physical Medicine and Rehabilitation

## 2013-04-08 ENCOUNTER — Ambulatory Visit (HOSPITAL_COMMUNITY): Payer: Medicare HMO

## 2013-04-10 ENCOUNTER — Ambulatory Visit (HOSPITAL_COMMUNITY)
Admission: RE | Admit: 2013-04-10 | Discharge: 2013-04-10 | Disposition: A | Discharge status: Home / Self Care | Payer: Medicare HMO | Source: Ambulatory Visit | Attending: Pulmonary Disease | Admitting: Pulmonary Disease

## 2013-04-10 DIAGNOSIS — Z5189 Encounter for other specified aftercare: Secondary | ICD-10-CM | POA: Diagnosis not present

## 2013-04-10 DIAGNOSIS — Z9181 History of falling: Secondary | ICD-10-CM

## 2013-04-10 DIAGNOSIS — R29898 Other symptoms and signs involving the musculoskeletal system: Secondary | ICD-10-CM

## 2013-04-10 NOTE — Progress Notes (Signed)
Occupational Therapy Treatment Patient Details  Name: [name] MRN: [id number] Date of Birth: Mar 11, 1935  Today's Date: 04/10/2013 Time: 4098-1191 OT Time Calculation (min): 39 min Therapeutic Exercises 4782-9562 (15') Therapeutic Activities 1308- 6578 (76')  Visit#: 5 of 12  Re-eval: 04/18/13    Authorization: [employer]  Authorization Time Period: Before 10th visit  Authorization Visit#: 5 of 10  Subjective Symptoms/Limitations Symptoms: S: "I tried it again... not a whole lot better." (handwriting) Pain Assessment Currently in Pain?: Yes Pain Score: 4  Pain Location: Shoulder Pain Orientation: Right;Left Pain Type: Acute pain  Exercise/Treatments Seated Elevation: PROM;12 reps Extension: AROM;12 reps Row: AROM;12 reps   ROM / Strengthening / Isometric Strengthening Proximal Shoulder Strengthening, Seated: 30 sec each up/down, criss/cross (pt could not toelrate 1 lb weight w left hand) Ball on Wall: 45 seconds, in flexion (unweighted ball)   Hand Exercises Theraputty - Flatten: red putty with left digits and palm Sponges: Opposition and in-hand manipulation to pick up 10 sponges. x3 trials Other Hand Exercises: Grasp 15 large beads, place and push into red putty. Using 1st, 2nd, 3rd digits to grasp and pull each out. In red putty, pushed .75 inch dowel rod into putty 23 times using full plmar grasp Other Hand Exercises: using 3 point pinch to attach 30 clothespins, and then unattach and drop back into box. In hand manipulation of clothespins     Manual Therapy Manual Therapy: Other (comment) Other Manual Therapy: Muscle energy technique for Lt anterior rotation f/b gait training  Occupational Therapy Assessment and Plan OT Assessment and Plan Clinical Impression Statement: A: Pt participated in shoulder stability exercises this session - had dificulty with proximal stability in sitting and ball on wall. (both unweighted). Tolerated well seated  extension/elevation/retraction. completed fine motor activities in standing to ensure appropriate shoulder posture. OT Plan: P: proximal shoulder strengthening (unweighted seated, ball on wall (on table, rather than wall). fine motor activity (weaving loom)   Goals Short Term Goals Short Term Goal 1: Pt will be educated on HEP. Short Term Goal 1 Progress: Progressing toward goal Short Term Goal 2: Pt will complete a fine motor activity for at least 5 minutes with no more than 1 rest break. Short Term Goal 2 Progress: Progressing toward goal Short Term Goal 3: Pt will copy at least 5 sentence paragraphs with fair plus legibility. Short Term Goal 3 Progress: Progressing toward goal Short Term Goal 4: Pt will report increased independence with bilateral hands in kitchen activities 50% of the time. Short Term Goal 4 Progress: Progressing toward goal Short Term Goal 5: Pt will don bra independently, in prior manner of donning. Short Term Goal 5 Progress: Progressing toward goal Long Term Goals Long Term Goal 1: Pt will return to maximum level of independence with B/IADLs and leisure activities. Long Term Goal 1 Progress: Progressing toward goal Long Term Goal 2: Pt will complete crocheting activity for 20 minutes with no rest breaks. Long Term Goal 2 Progress: Progressing toward goal Long Term Goal 3: Pt will sign name with good legibility (line placement, line spacing) and no reports of frustration, 100% of the time. Long Term Goal 3 Progress: Progressing toward goal Long Term Goal 4: Pt will decrease 9-Hole peg score with LUE less than or equal to 40 seconds. Long Term Goal 4 Progress: Progressing toward goal Long Term Goal 5: Pt will improve her DASH score by at least 5 points. Long Term Goal 5 Progress: Progressing toward goal  Problem List Patient Active  Problem List   Diagnosis Date Noted  . Lack of coordination 03/21/2013  . Fine motor impairment 03/21/2013  . Left leg weakness  03/17/2013  . Risk for falls 03/17/2013  . Carotid artery disease 03/11/2013  . Subclavian artery stenosis 03/11/2013  . TIA (transient ischemic attack) 03/11/2013  . Tobacco use disorder 03/11/2013  . CVA (cerebral infarction) 03/05/2013  . Pain in joint, shoulder region 11/01/2010    End of Session Activity Tolerance: Patient tolerated treatment well General Behavior During Therapy: [facility] for tasks assessed/performed OT Plan of Care OT Home Exercise Plan: added shoulder elevation, extension, and retraction for increased shoulder stability to improve fine motor coordination OT Patient Instructions: verbalization, demonstration Consulted and Agree with Plan of Care: Patient  Pennock, Geraldine, OTR/L [phone]  04/10/2013, 4:33 PM

## 2013-04-10 NOTE — Progress Notes (Signed)
Physical Therapy Treatment Patient Details  Name: FLORNCE RECORD MRN: [id number] Date of Birth: 05-29-1935  Today's Date: 04/10/2013 Time: 6286-3817 PT Time Calculation (min): 48 min Charge Gait 1300-1325; Manual 1325-1337, NMR 7116-5790  Visit#: 5 of 8  Re-eval: 04/16/13 Assessment Diagnosis: R CVA  Surgical Date: 02/27/13 Next MD Visit: Dr. Letta Pate MD  Prior Therapy: yes   Authorization: Medicare [employer]   Authorization Time Period:    Authorization Visit#: 5 of 8   Subjective: Symptoms/Limitations Symptoms: Pt stated both her shoulders are bothering her today, reported increased pain with cold weather.   Pain Assessment Currently in Pain?: Yes Pain Score: 4  Pain Location: Shoulder Pain Orientation: Right;Left Pain Type: Acute pain  Objective:   Exercise/Treatments Balance Exercises Standing Balance Beam: tandem, retro and sidestepping 1RT Sidestepping: 3 reps;Limitations Sidestepping Limitations: cueing for Lt LE in neutral Step Over Hurdles / Cones: 6 and 12 in step Other Standing Exercises: side stepping agiility ladder 2RT emphasis on keeping toes pointed forward; gait training x 8 min following MET for Lt anterior rotation; Balance beam 1RT tandem, retro and sidestepping;  6 and 12 in hurdles alternating 3RT forward and sidestepping Other Standing Exercises: Standing toe raises no HHA 10x, agility ladder forward 6x with emphasis on increasing hip rotation for equalized stride length.  Seated Other Seated Exercises: Nu step  10 min warm up hill level #3, resistance 2; emphasis on pushing through left LE for strengthening and muscle re education  SPM average 54   Manual Therapy Manual Therapy: Other (comment) Other Manual Therapy: Muscle energy technique for Lt anterior rotation f/b gait training  Physical Therapy Assessment and Plan PT Assessment and Plan Clinical Impression Statement: Pt improved activity tolerance, able to increase time with Nustep  and no seated rest breaks required this session.  Sesson focus on improving gait mechanics with cueing to increase and equalize stride length, decrease Lt LE ER and therapist facilitation to impove hip mobility.  Manual muscle energy techniques complete to improve sacroiliac alignement with hip and back pain reduced and improved gait mechanics.  Progressed to dynamic surface with balance activtieis with min assistance required following cueing to improve spatial awareness. PT Plan: balance activities, walking enduracne activities, walking around floor obstacles, ther exercise     Goals PT Short Term Goals Time to Complete Short Term Goals: 2 weeks PT Short Term Goal 1: patient ambulate 5 min at home without rest break  no device  PT Short Term Goal 2: patient safely step on /off soft surfaces min hand support for home and community safety  PT Short Term Goal 2 - Progress: Progressing toward goal PT Long Term Goals Time to Complete Long Term Goals: 4 weeks PT Long Term Goal 1: Improve BERG balance score from 44/56 to > 49/56 for decreased falls risk  PT Long Term Goal 1 - Progress: Progressing toward goal PT Long Term Goal 2: Improve Time up and go to less than 17 seconds for decreased falls risk   Long Term Goal 3: Patient tolerating 15 min standing activities without required rest break for endurance during household ADLS  Long Term Goal 4: patient able to safely carry lgiht weight items and safely ambulate around floor obstacles for home safety   Problem List Patient Active Problem List   Diagnosis Date Noted  . Lack of coordination 03/21/2013  . Fine motor impairment 03/21/2013  . Left leg weakness 03/17/2013  . Risk for falls 03/17/2013  . Carotid artery disease 03/11/2013  .  Subclavian artery stenosis 03/11/2013  . TIA (transient ischemic attack) 03/11/2013  . Tobacco use disorder 03/11/2013  . CVA (cerebral infarction) 03/05/2013  . Pain in joint, shoulder region 11/01/2010     PT - End of Session Activity Tolerance: Patient tolerated treatment well;Patient limited by fatigue General Behavior During Therapy: [facility] for tasks assessed/performed  GP    Aldona Lento 04/10/2013, 3:43 PM

## 2013-04-15 ENCOUNTER — Ambulatory Visit (HOSPITAL_COMMUNITY)
Admission: RE | Admit: 2013-04-15 | Discharge: 2013-04-15 | Disposition: A | Discharge status: Home / Self Care | Payer: Medicare HMO | Source: Ambulatory Visit | Attending: Pulmonary Disease | Admitting: Pulmonary Disease

## 2013-04-15 ENCOUNTER — Other Ambulatory Visit (HOSPITAL_COMMUNITY): Payer: Self-pay | Admitting: Podiatry

## 2013-04-15 DIAGNOSIS — Z5189 Encounter for other specified aftercare: Secondary | ICD-10-CM | POA: Diagnosis not present

## 2013-04-15 DIAGNOSIS — L98499 Non-pressure chronic ulcer of skin of other sites with unspecified severity: Secondary | ICD-10-CM

## 2013-04-15 DIAGNOSIS — Z9181 History of falling: Secondary | ICD-10-CM

## 2013-04-15 DIAGNOSIS — R29898 Other symptoms and signs involving the musculoskeletal system: Secondary | ICD-10-CM

## 2013-04-15 NOTE — Progress Notes (Signed)
Physical Therapy Treatment Patient Details  Name: [name] MRN: [id number] Date of Birth: September 22, 1935  Today's Date: 04/15/2013 Time: 1300-1353 PT Time Calculation (min): 93 min Charge NMR 1300-1330, Gait 9767-3419  Visit#: 6 of 8  Re-eval: 04/16/13 Assessment Diagnosis: R CVA  Surgical Date: 02/27/13 Next MD Visit: Dr. Letta Pate MD  Prior Therapy: yes   Authorization: Medicare [employer]   Authorization Time Period:    Authorization Visit#: 6 of 8   Subjective: Symptoms/Limitations Symptoms: Pt reported she feels her balance was impaired prior her CVA, feels balance is improving.  Moat difficulty seems to be with writing.  Shoulders are feeling better today, still painful partially because of the cold weather.   Pain Assessment Currently in Pain?: Yes Pain Score: 3  Pain Location: Shoulder Pain Orientation: Right;Left  Objective:  Exercise/Treatments Balance Exercises Standing SLS: Eyes open;3 reps;Time SLS Time: Rt 16" Lt 10" max of 3 Standing, One Foot on a Step: Eyes open;6 inch;Time Standing, One Foot on a Step Time: 60 seconds no HHA Balance Beam: tandem, retro and sidestepping 1RT Sidestepping: 3 reps;Limitations;Theraband Theraband Level (Sidestepping): Level 3 (Green) Sidestepping Limitations: cueing for Lt LE in neutral Numbers 1-15: Foam;1 rep Other Standing Exercises: Forward, backwards and side stepping through agility ladder; sidestepping with green tband 2RT cueing to keep LE in neutral; Heel and toe walking 1RT; toe tapping 20x alternating no HHA Other Standing Exercises: Gait training on TM LL 76.5 speed .42-->.5 mph x 5 minute due to limited by fatigue  Seated Other Seated Exercises: TUG 12" best of 3 Other Seated Exercises: 10 STS in 1', 5 STS in 28"  Supine Head Turns:        Physical Therapy Assessment and Plan PT Assessment and Plan Clinical Impression Statement: Session focus on improving gait mechanics, balance and activity  tolerance.  Pt improved time with TUG to 12" safely with no AD.  Began gait training on TM with visual and verbal cueing for heel to toe pattern, posture and to increased Lt LE stride length and stance phase.  Pt limited by fatigue, had to end gait training early per Lt LE fatigue.   PT Plan: Re-eval next session, continue gait training for increased gait velocity around floor obstacles, endurance activities, and dynamic balance training     Goals PT Short Term Goals Time to Complete Short Term Goals: 2 weeks PT Short Term Goal 1: patient ambulate 5 min at home without rest break  no device  PT Short Term Goal 2: patient safely step on /off soft surfaces min hand support for home and community safety  PT Short Term Goal 2 - Progress: Progressing toward goal PT Long Term Goals Time to Complete Long Term Goals: 4 weeks PT Long Term Goal 1: Improve BERG balance score from 44/56 to > 49/56 for decreased falls risk  PT Long Term Goal 1 - Progress: Progressing toward goal PT Long Term Goal 2: Improve Time up and go to less than 17 seconds for decreased falls risk   PT Long Term Goal 2 - Progress: Met Long Term Goal 3: Patient tolerating 15 min standing activities without required rest break for endurance during household ADLS  Long Term Goal 3 Progress: Progressing toward goal Long Term Goal 4: patient able to safely carry lgiht weight items and safely ambulate around floor obstacles for home safety   Problem List Patient Active Problem List   Diagnosis Date Noted  . Lack of coordination 03/21/2013  . Fine motor impairment  03/21/2013  . Left leg weakness 03/17/2013  . Risk for falls 03/17/2013  . Carotid artery disease 03/11/2013  . Subclavian artery stenosis 03/11/2013  . TIA (transient ischemic attack) 03/11/2013  . Tobacco use disorder 03/11/2013  . CVA (cerebral infarction) 03/05/2013  . Pain in joint, shoulder region 11/01/2010    PT - End of Session Activity Tolerance: Patient  tolerated treatment well;Patient limited by fatigue General Behavior During Therapy: [facility] for tasks assessed/performed  GP    Aldona Lento 04/15/2013, 2:16 PM

## 2013-04-17 ENCOUNTER — Inpatient Hospital Stay (HOSPITAL_COMMUNITY): Admission: RE | Admit: 2013-04-17 | Payer: Medicare HMO | Source: Ambulatory Visit | Admitting: Physical Therapy

## 2013-04-18 ENCOUNTER — Ambulatory Visit (HOSPITAL_COMMUNITY)
Admission: RE | Admit: 2013-04-18 | Discharge: 2013-04-18 | Disposition: A | Discharge status: Home / Self Care | Payer: Medicare HMO | Source: Ambulatory Visit | Attending: Podiatry | Admitting: Podiatry

## 2013-04-18 DIAGNOSIS — I739 Peripheral vascular disease, unspecified: Secondary | ICD-10-CM | POA: Insufficient documentation

## 2013-04-18 DIAGNOSIS — L97909 Non-pressure chronic ulcer of unspecified part of unspecified lower leg with unspecified severity: Secondary | ICD-10-CM | POA: Insufficient documentation

## 2013-04-18 DIAGNOSIS — L98499 Non-pressure chronic ulcer of skin of other sites with unspecified severity: Secondary | ICD-10-CM | POA: Insufficient documentation

## 2013-04-22 ENCOUNTER — Ambulatory Visit (HOSPITAL_COMMUNITY)
Admission: RE | Admit: 2013-04-22 | Discharge: 2013-04-22 | Disposition: A | Discharge status: Home / Self Care | Payer: Medicare HMO | Source: Ambulatory Visit | Attending: Pulmonary Disease | Admitting: Pulmonary Disease

## 2013-04-22 ENCOUNTER — Ambulatory Visit (HOSPITAL_COMMUNITY): Payer: Medicare HMO | Admitting: Physical Therapy

## 2013-04-22 DIAGNOSIS — I69998 Other sequelae following unspecified cerebrovascular disease: Secondary | ICD-10-CM | POA: Diagnosis not present

## 2013-04-22 DIAGNOSIS — R279 Unspecified lack of coordination: Secondary | ICD-10-CM | POA: Diagnosis not present

## 2013-04-22 DIAGNOSIS — R269 Unspecified abnormalities of gait and mobility: Secondary | ICD-10-CM | POA: Diagnosis not present

## 2013-04-22 DIAGNOSIS — M6281 Muscle weakness (generalized): Secondary | ICD-10-CM | POA: Diagnosis not present

## 2013-04-22 DIAGNOSIS — Z9181 History of falling: Secondary | ICD-10-CM | POA: Diagnosis not present

## 2013-04-22 DIAGNOSIS — Z5189 Encounter for other specified aftercare: Secondary | ICD-10-CM | POA: Insufficient documentation

## 2013-04-22 DIAGNOSIS — R29898 Other symptoms and signs involving the musculoskeletal system: Secondary | ICD-10-CM

## 2013-04-22 NOTE — Evaluation (Signed)
Physical Therapy Progress Note   Patient Details  Name: [name] MRN: [id number] Date of Birth: 1935/07/12  Today's Date: 04/22/2013 Time: 9379-0240 PT Time Calculation (min): 45 min    NMR 1430 - 1515           Visit#: 7 of 11  Re-eval: 05/22/13 Assessment Diagnosis: R CVA  Surgical Date: 02/27/13 Next MD Visit: Dr. Letta Pate MD  Prior Therapy: yes   Authorization: Medicare [employer] HMO    g code on 7th visit     Authorization Time Period:    Authorization Visit#: 7 of 17   Past Medical History:  Past Medical History  Diagnosis Date  . Arthritis   . Hypertension   . PONV (postoperative nausea and vomiting)     severe n/v after every surgery  . Hypothyroidism   . Hypothyroid   . Peripheral vascular disease   . Osteoarthritis   . GERD (gastroesophageal reflux disease)   . Constipation due to pain medication   . COPD (chronic obstructive pulmonary disease)   . Depression    Past Surgical History:  Past Surgical History  Procedure Laterality Date  . Total knee arthroplasty    . Carotid stent    . Shoulder surgery    . Abdominal hysterectomy    . Cholecystectomy    . Back surgery    . Rotator cuff repair    . Eye surgery      cataract /lens both eyes  . Vascular surgery    . Joint replacement  [id number]    knees  . Anterior cervical decomp/discectomy fusion N/A 04/03/2012    Procedure: ANTERIOR CERVICAL DECOMPRESSION/DISCECTOMY FUSION 2 LEVELS;  Surgeon: Hosie Spangle, MD;  Location: [place] [facility];  Service: Neurosurgery;  Laterality: N/A;  Cervical four-five,Cervical five-six anterior cervical decompression with fusion plating and bonegraft  . Cholecystectomy      Subjective Symptoms/Limitations Symptoms: improving strength and balance and would like to continue therapy , has not returned to [employer] but needs to wait per pending prt  foot ulcer  Pertinent History: stroke 02/27/13, went intosurgery for heart stent and complications resulted in stroke , R CVA,   patient is left handed ,  hx of B knee replacements  , attended [employer] claases prior to stroke , RA  Patient Stated Goals: return to [facility] fitness class, has returned to driving  Pain Assessment Currently in Pain?: No/denies Pain Score: 3    Exercise/Treatments Mobility/Balance  Berg Balance Test Sit to Stand: Able to stand  independently using hands Standing Unsupported: Able to stand safely 2 minutes Sitting with Back Unsupported but Feet Supported on Floor or Stool: Able to sit safely and securely 2 minutes Stand to Sit: Sits safely with minimal use of hands Transfers: Able to transfer safely, minor use of hands Standing Unsupported with Eyes Closed: Able to stand 10 seconds safely Standing Ubsupported with Feet Together: Able to place feet together independently and stand for 1 minute with supervision From Standing, Reach Forward with Outstretched Arm: Can reach forward >12 cm safely (5") From Standing Position, Pick up Object from Floor: Able to pick up shoe safely and easily From Standing Position, Turn to Look Behind Over each Shoulder: Looks behind one side only/other side shows less weight shift Turn 360 Degrees: Able to turn 360 degrees safely in 4 seconds or less Standing Unsupported, Alternately Place Feet on Step/Stool: Able to stand independently and complete 8 steps >20 seconds Standing Unsupported, One Foot in Front: Needs help to  step but can hold 15 seconds Standing on One Leg: Tries to lift leg/unable to hold 3 seconds but remains standing independently Total Score: 45 Timed Up and Go Test TUG: Normal TUG Normal TUG (seconds): 16.6   Balance Exercises Standing Retro Gait: 3 reps;Limitations Step Over Hurdles / Cones: low hurdles 4  , 6 reps  Marching: Hand held assist (HHA) 1;20 reps Heel Raises: 20 reps Other Standing Exercises: Forward, backwards and side stepping through agility ladder; sidestepping with green tband 2RT cueing to keep LE in neutral; Heel and toe  walking 1RT; toe tapping 20x alternating no HHA Other Standing Exercises: carrying tray Both hands  items on it  with supervision x 3 min  , stairs 1 HR 1 RT      Other Seated Exercises: Nustep level 2 8 min UE/LE  Other Seated Exercises: 5 x sit to stand minimal use of upper extremities    Physical Therapy Assessment and Plan PT Assessment and Plan Clinical Impression Statement: patient improving on balance, safety, and gait speed per testing, still requires skilled therapy to reach functional goals regarding gait speed, walkng and standing endurance  Pt will benefit from skilled therapeutic intervention in order to improve on the following deficits: Decreased balance;Abnormal gait;Decreased strength Rehab Potential: Good Clinical Impairments Affecting Rehab Potential: RA  PT Frequency: Min 2X/week PT Duration: 4 weeks PT Treatment/Interventions: Functional mobility training;Therapeutic activities;Therapeutic exercise;Patient/family education;Neuromuscular re-education;Modalities;Balance training;Gait training PT Plan: continue 1 time a week for financial concerns but increase to 2 times if patient changes mind, she will also continue OT as prescribed by OT, PT will continue towards goals to maximze function and safety     Goals Home Exercise Program Pt/caregiver will Perform Home Exercise Program: For increased strengthening;For improved balance PT Goal: Perform Home Exercise Program - Progress: Progressing toward goal PT Short Term Goals Time to Complete Short Term Goals: 2 weeks PT Short Term Goal 1: patient ambulate 5 min at home without rest break  no device  PT Short Term Goal 1 - Progress: Met PT Short Term Goal 2: patient safely step on /off soft surfaces min hand support for home and community safety  PT Short Term Goal 2 - Progress: Progressing toward goal PT Long Term Goals Time to Complete Long Term Goals: 4 weeks PT Long Term Goal 1: Improve BERG balance score from 44/56  to > 49/56 for decreased falls risk  PT Long Term Goal 1 - Progress: Met PT Long Term Goal 2: Improve Time up and go to less than 17 seconds for decreased falls risk  - met    revised to time up and go to less than 16 seconds for community ambuation safety  PT Long Term Goal 2 - Progress: Progressing toward goal Long Term Goal 3: patient tolerate 20 min standing acitivites without rest break for enduracne during ADLS - new goal  met goal of 15 min  Long Term Goal 3 Progress: Progressing toward goal Long Term Goal 4: patient able to safely carry lgiht weight items and safely ambulate around floor obstacles for home safety  Long Term Goal 4 Progress: Met PT Long Term Goal 5: patient able to make sharp turn carrying items without loss of balance - new goal    Problem List Patient Active Problem List   Diagnosis Date Noted  . Lack of coordination 03/21/2013  . Fine motor impairment 03/21/2013  . Left leg weakness 03/17/2013  . Risk for falls 03/17/2013  . Carotid artery disease 03/11/2013  .  Subclavian artery stenosis 03/11/2013  . TIA (transient ischemic attack) 03/11/2013  . Tobacco use disorder 03/11/2013  . CVA (cerebral infarction) 03/05/2013  . Pain in joint, shoulder region 11/01/2010    PT - End of Session Activity Tolerance: Patient tolerated treatment well;Patient limited by fatigue General Behavior During Therapy: [facility] for tasks assessed/performed PT Plan of Care Consulted and Agree with Plan of Care: Patient  GP Functional Assessment Tool Used: FOTO mobility current score 61, original 58 Functional Limitation: Mobility: Walking and moving around Mobility: Walking and Moving Around Current Status ([id number]): At least 20 percent but less than 40 percent impaired, limited or restricted Mobility: Walking and Moving Around Goal Status [phone]): At least 20 percent but less than 40 percent impaired, limited or restricted  Biruk Troia 04/22/2013, 4:00 PM  Physician  Documentation Your signature is required to indicate approval of the treatment plan as stated above.  Please sign and either send electronically or make a copy of this report for your files and return this physician signed original.   Please mark one 1.__approve of plan  2. ___approve of plan with the following conditions.   ______________________________                                                          _____________________ Physician Signature                                                                                                             Date

## 2013-04-22 NOTE — Progress Notes (Addendum)
Occupational Therapy Treatment Patient Details  Name: [name] MRN: [id number] Date of Birth: 07-03-1935  Today's Date: 04/22/2013 Time: 2595-6387 OT Time Calculation (min): 44 min Manual 1353-1403 (10') Therapeutic Exercises 5643-3295 (65')  Visit#: 6 of 12  Re-eval: 04/18/13    Authorization: [employer]  Authorization Time Period: Before 10th visit  Authorization Visit#: 6 of 10  Subjective Symptoms/Limitations Symptoms: S: Its just been one of those days - the dampness, I think." Pain Assessment Currently in Pain?: Yes Pain Score: 3  Pain Location: Back Pain Type: Acute pain  Precautions/Restrictions     Exercise/Treatments Seated Elevation: AROM;15 reps Extension: AROM;15 reps Row: AROM;15 reps  ROM / Strengthening / Isometric Strengthening Ball on Wall: 1", unweighted ball, on tabletop surface   Hand Exercises Other Hand Exercises: Grasped  7 eache red, green, blue and black clothespins to place on bars and take off again. cueing for in-mand manipulation of clothespins and depression sof shoulder. used three point and lateral pinch, alternating. Other Hand Exercises: Red theraputty - using left hand to find and pull out 6 large beads, and stabilizing with right hand.     Manual Therapy Manual Therapy: Myofascial release Myofascial Release: To left hand to decrease pain and tightness in order to facilitate improved fine motor coordination. And to left upper arm and upper trap areas to decrease shoulder tightness to facilitate improved proximal stability.  Occupational Therapy Assessment and Plan OT Assessment and Plan Clinical Impression Statement: A: Transitioned ball on wall proximla stability exercises to table top - pt had improved tolerance. pt able to grasp all levels of colored clothespins, using three point and latral pinches with cueing. OT Plan: P: Re-Evaluation - weaving loom for fine motor activity. Follow up on HEP.   Goals Short Term  Goals Short Term Goal 1: Pt will be educated on HEP. Short Term Goal 1 Progress: Progressing toward goal Short Term Goal 2: Pt will complete a fine motor activity for at least 5 minutes with no more than 1 rest break. Short Term Goal 2 Progress: Progressing toward goal Short Term Goal 3: Pt will copy at least 5 sentence paragraphs with fair plus legibility. Short Term Goal 3 Progress: Progressing toward goal Short Term Goal 4: Pt will report increased independence with bilateral hands in kitchen activities 50% of the time. Short Term Goal 4 Progress: Progressing toward goal Short Term Goal 5: Pt will don bra independently, in prior manner of donning. Short Term Goal 5 Progress: Progressing toward goal Long Term Goals Long Term Goal 1: Pt will return to maximum level of independence with B/IADLs and leisure activities. Long Term Goal 1 Progress: Progressing toward goal Long Term Goal 2: Pt will complete crocheting activity for 20 minutes with no rest breaks. Long Term Goal 2 Progress: Progressing toward goal Long Term Goal 3: Pt will sign name with good legibility (line placement, line spacing) and no reports of frustration, 100% of the time. Long Term Goal 3 Progress: Progressing toward goal Long Term Goal 4: Pt will decrease 9-Hole peg score with LUE less than or equal to 40 seconds. Long Term Goal 4 Progress: Progressing toward goal Long Term Goal 5: Pt will improve her DASH score by at least 5 points. Long Term Goal 5 Progress: Progressing toward goal  Problem List Patient Active Problem List   Diagnosis Date Noted  . Lack of coordination 03/21/2013  . Fine motor impairment 03/21/2013  . Left leg weakness 03/17/2013  . Risk for falls 03/17/2013  .  Carotid artery disease 03/11/2013  . Subclavian artery stenosis 03/11/2013  . TIA (transient ischemic attack) 03/11/2013  . Tobacco use disorder 03/11/2013  . CVA (cerebral infarction) 03/05/2013  . Pain in joint, shoulder region  11/01/2010    End of Session Activity Tolerance: Patient tolerated treatment well General Behavior During Therapy: [facility] for tasks assessed/performed  GO   Bea Graff, MS, OTR/L [phone]  04/22/2013, 2:53 PM

## 2013-04-24 ENCOUNTER — Inpatient Hospital Stay (HOSPITAL_COMMUNITY): Admission: RE | Admit: 2013-04-24 | Payer: Medicare HMO | Source: Ambulatory Visit

## 2013-04-24 ENCOUNTER — Ambulatory Visit (HOSPITAL_COMMUNITY): Payer: Medicare HMO | Admitting: Specialist

## 2013-04-24 ENCOUNTER — Telehealth (HOSPITAL_COMMUNITY): Payer: Self-pay

## 2013-04-24 NOTE — Telephone Encounter (Signed)
Patient missed scheduled appointment for OT this date. Patient states that she had a 2:00 appointment with Podiatrist and did not realize she had an OT appointment. Patient thought she was only coming one time a week. Reviewed patient's scheduled appointments and informed her that she is only scheduled once a week for PT and she is still scheduled two times a week with OT. Patient understood and was thankful for phone call. Patient will be at next scheduled appointment.   Ailene Ravel, OTR/L,CBIS  04/24/13 3:31PM

## 2013-04-29 ENCOUNTER — Ambulatory Visit (HOSPITAL_COMMUNITY)
Admission: RE | Admit: 2013-04-29 | Discharge: 2013-04-29 | Disposition: A | Discharge status: Home / Self Care | Payer: Medicare HMO | Source: Ambulatory Visit | Attending: Pulmonary Disease | Admitting: Pulmonary Disease

## 2013-04-29 DIAGNOSIS — Z9181 History of falling: Secondary | ICD-10-CM

## 2013-04-29 DIAGNOSIS — Z5189 Encounter for other specified aftercare: Secondary | ICD-10-CM | POA: Diagnosis not present

## 2013-04-29 DIAGNOSIS — R29898 Other symptoms and signs involving the musculoskeletal system: Secondary | ICD-10-CM

## 2013-04-29 NOTE — Progress Notes (Signed)
Physical Therapy Treatment Patient Details  Name: [name] MRN: [id number] Date of Birth: [birth year]  Today's Date: 04/29/2013 Time: 1694-5038 PT Time Calculation (min): 43 min Charge: Gait 1440-1503, TE [id number].  Visit#: 8 of 11  Re-eval: 05/22/13    Authorization: Medicare [employer]    g code on 7th visit   Authorization Time Period:    Authorization Visit#: 8 of 17   Subjective: Symptoms/Limitations Symptoms: Pt reports most difficulty with speed, feels her balance is improving.  Currently pan free   Pain Assessment Currently in Pain?: No/denies  Objective:   Exercise/Treatments Balance Exercises Standing SLS: Eyes open;3 reps;Time SLS Time: Rt 24", Lt 19" max of 3 Turning: Limitations Turning Limitations: DGI working on  velocity and making sharp turns Marching: Limitations Marching Limitations: 2RT with 3" holds Other Standing Exercises: 6in step up and down no HHA 10x each foot Other Standing Exercises: TUG 3 attempts with 13" best time, 10 STS with no HHA 1'10"; TM @ 1.5 x 3'  limited by fatigue- ended early   Seated Other Seated Exercises: Nustep x 10 min, hill level 3, resistance level 3, LE only for activity tolerance and LE strengthening   Physical Therapy Assessment and Plan PT Assessment and Plan Clinical Impression Statement: Session focus on improving gait velocity and strengthen training to improve functional tasks.  Pt able to complete TUG in 13" with no HHA.  Pt able to complete high level balance activities with no LOB episodes.  Began gait training on treadmill to improve activity tolerance and increase gait velocity, pt limited by fatigue at 3 minutes.  Ended session on Nustep to increased activity tolerance, LE strengthening with average step per minute of 70.   PT Plan: Continue with current POC for functional safety with high level balance training and to increase activity tolerance and gait velocity.    Goals Home Exercise  Program Pt/caregiver will Perform Home Exercise Program: For increased strengthening;For improved balance PT Short Term Goals Time to Complete Short Term Goals: 2 weeks PT Short Term Goal 1: patient ambulate 5 min at home without rest break  no device  PT Short Term Goal 2: patient safely step on /off soft surfaces min hand support for home and community safety  PT Short Term Goal 2 - Progress: Progressing toward goal PT Long Term Goals Time to Complete Long Term Goals: 4 weeks PT Long Term Goal 1: Improve BERG balance score from 44/56 to > 49/56 for decreased falls risk  PT Long Term Goal 2: Improve Time up and go to less than 17 seconds for decreased falls risk  - met    revised to time up and go to less than 16 seconds for community ambuation safety  PT Long Term Goal 2 - Progress: Met (TUG 13" no HHA with STS) Long Term Goal 3: patient tolerate 20 min standing acitivites without rest break for enduracne during ADLS - new goal  met goal of 15 min  Long Term Goal 3 Progress: Progressing toward goal Long Term Goal 4: patient able to safely carry lgiht weight items and safely ambulate around floor obstacles for home safety  PT Long Term Goal 5: patient able to make sharp turn carrying items without loss of balance - new goal   Long Term Goal 5 Progress: Progressing toward goal  Problem List Patient Active Problem List   Diagnosis Date Noted  . Lack of coordination 03/21/2013  . Fine motor impairment 03/21/2013  . Left leg weakness 03/17/2013  .  Risk for falls 03/17/2013  . Carotid artery disease 03/11/2013  . Subclavian artery stenosis 03/11/2013  . TIA (transient ischemic attack) 03/11/2013  . Tobacco use disorder 03/11/2013  . CVA (cerebral infarction) 03/05/2013  . Pain in joint, shoulder region 11/01/2010    PT - End of Session Activity Tolerance: Patient tolerated treatment well;Patient limited by fatigue General Behavior During Therapy: [facility] for tasks  assessed/performed  GP    Aldona Lento 04/29/2013, 3:50 PM

## 2013-04-29 NOTE — Evaluation (Signed)
Occupational Therapy Re-Evaluation  Patient Details  Name: [name] MRN: [id number] Date of Birth: 1935-09-22  Today's Date: 04/29/2013 Time: 1583-0940 OT Time Calculation (min): 32 min  MMT 32'   Visit#: 7 of 12  Re-eval: 05/27/13  Assessment Diagnosis: CVA Surgical Date:  (n/a) Next MD Visit: 05/02/13  Authorization: [employer]  Authorization Time Period: Before 17th visit  Authorization Visit#: 7 of 17   Past Medical History:  Past Medical History  Diagnosis Date  . Arthritis   . Hypertension   . PONV (postoperative nausea and vomiting)     severe n/v after every surgery  . Hypothyroidism   . Hypothyroid   . Peripheral vascular disease   . Osteoarthritis   . GERD (gastroesophageal reflux disease)   . Constipation due to pain medication   . COPD (chronic obstructive pulmonary disease)   . Depression    Past Surgical History:  Past Surgical History  Procedure Laterality Date  . Total knee arthroplasty    . Carotid stent    . Shoulder surgery    . Abdominal hysterectomy    . Cholecystectomy    . Back surgery    . Rotator cuff repair    . Eye surgery      cataract /lens both eyes  . Vascular surgery    . Joint replacement  [id number]    knees  . Anterior cervical decomp/discectomy fusion N/A 04/03/2012    Procedure: ANTERIOR CERVICAL DECOMPRESSION/DISCECTOMY FUSION 2 LEVELS;  Surgeon: Hewitt Shorts, MD;  Location: [facility];  Service: Neurosurgery;  Laterality: N/A;  Cervical four-five,Cervical five-six anterior cervical decompression with fusion plating and bonegraft  . Cholecystectomy      Subjective Symptoms/Limitations Symptoms: S: i'm writing a little better, i got a long thank you note written yesterday. I didn't want to mail it unless they could read it! My handwriting still needs work - still needs a little fine tuning." Special Tests: DASH 15.9090 Pain Assessment Currently in Pain?: No/denies  Balance Screening Balance  Screen Has the patient fallen in the past 6 months: Yes How many times?: 1   Assessment ADL/Vision/Perception ADL ADL Comments: Pt is independent in all ADLs. Now hooks bra in front. Scrubbing back can be difficult.  Dominant Hand: Left   Sensation/Coordination/Edema Sensation Additional Comments: numb/tingly/aching w cold (R hand) (from prior to stroke). Baseline 4th/5th digit change in sensation Coordination 9 Hole Peg Test: Current: Left 31.92, Right 26.19 (no complaints of fatigue) - Previous: L 46.7 seconds; R 38.13 seconds  Additional Assessments RUE AROM (degrees) RUE Overall AROM Comments: Proximal ROM deficits from prior surgeries. Distal grossly within functional limits.  RUE Strength Grip (lbs): 47 (42) Lateral Pinch: 8 lbs (8) 3 Point Pinch: 9 lbs (7) LUE AROM (degrees) LUE Overall AROM Comments: Proximal ROM deficits from prior surgeries. Distal grossly within functional limits.  LUE Strength LUE Overall Strength Comments: Pt sill has low endurance doing distal actiities, but says it has improved some. Pt was able to hem pants without fatigue. Grip (lbs): 49 (45) Lateral Pinch: 10 lbs (8) 3 Point Pinch: 9 lbs (7) Right Hand Strength - Pinch (lbs) Lateral Pinch: 8 lbs (8) 3 Point Pinch: 9 lbs (7) Left Hand Strength - Pinch (lbs) Lateral Pinch: 10 lbs (8) 3 Point Pinch: 9 lbs (7)      Occupational Therapy Assessment and Plan OT Assessment and Plan Clinical Impression Statement: A: Re-evaluation completed this date. Pt has met 4/5 STG and 3/5 LTG, and is progressing  towards remaining goals. pts activity to[place]rance with fine motor skills has improved, but pt will still benefit from skilled OT intervention to continue fine motor improvements. OT Plan: P: Follow up on HEP - increase theraputty for potential HEP.   Goals Home Exercise Program Pt/caregiver will Perform Home Exercise Program: For increased strengthening;For improved balance Short Term Goals Short  Term Goal 1: Pt will be educated on HEP. Short Term Goal 1 Progress: Met Short Term Goal 2: Pt will complete a fine motor activity for at least 5 minutes with no more than 1 rest break. Short Term Goal 2 Progress: Met Short Term Goal 3: Pt will copy at least 5 sentence paragraphs with fair plus legibility. Short Term Goal 3 Progress: Progressing toward goal Short Term Goal 4: Pt will report increased independence with bilateral hands in kitchen activities 50% of the time. Short Term Goal 4 Progress: Met Short Term Goal 5: Pt will don bra independently, in prior manner of donning. Short Term Goal 5 Progress: Met Long Term Goals Long Term Goal 1: Pt will return to maximum level of independence with B/IADLs and leisure activities. Long Term Goal 1 Progress: Progressing toward goal Long Term Goal 2: Pt will complete crocheting activity for 20 minutes with no rest breaks. Long Term Goal 2 Progress: Met Long Term Goal 3: Pt will sign name with good legibility (line placement, line spacing) and no reports of frustration, 100% of the time. Long Term Goal 3 Progress: Progressing toward goal Long Term Goal 4: Pt will decrease 9-Hole peg score with LUE less than or equal to 40 seconds. Long Term Goal 4 Progress: Met Long Term Goal 5: Pt will improve her DASH score by at least 5 points. Long Term Goal 5 Progress: Met  Problem List Patient Active Problem List   Diagnosis Date Noted  . Lack of coordination 03/21/2013  . Fine motor impairment 03/21/2013  . Left leg weakness 03/17/2013  . Risk for falls 03/17/2013  . Carotid artery disease 03/11/2013  . Subclavian artery stenosis 03/11/2013  . TIA (transient ischemic attack) 03/11/2013  . Tobacco use disorder 03/11/2013  . CVA (cerebral infarction) 03/05/2013  . Pain in joint, shoulder region 11/01/2010    End of Session Activity Tolerance: Patient tolerated treatment well General Behavior During Therapy: [facility] for tasks  assessed/performed  GO Functional Assessment Tool Used: Current DASH 15.9% imparied, Previous Dash 27.27% impairment Functional Limitation: Carrying, moving and handling objects Carrying, Moving and Handling Objects Current Status ([id number]): At least 1 percent but less than 20 percent impaired, limited or restricted Carrying, Moving and Handling Objects Goal Status [phone]): At least 1 percent but less than 20 percent impaired, limited or restricted  Bea Graff, Theodore, OTR/L [phone]  04/29/2013, 4:39 PM  Physician Documentation Your signature is required to indicate approval of the treatment plan as stated above.  Please sign and either send electronically or make a copy of this report for your files and return this physician signed original.  Please mark one 1.__approve of plan  2. ___approve of plan with the following conditions.   ______________________________                                                          _____________________ Physician Signature  Date  

## 2013-05-01 ENCOUNTER — Ambulatory Visit (HOSPITAL_COMMUNITY): Payer: Medicare HMO | Admitting: Specialist

## 2013-05-02 ENCOUNTER — Encounter: Payer: Medicare HMO | Attending: Physical Medicine & Rehabilitation

## 2013-05-02 ENCOUNTER — Ambulatory Visit (HOSPITAL_BASED_OUTPATIENT_CLINIC_OR_DEPARTMENT_OTHER): Payer: Medicare HMO | Admitting: Physical Medicine & Rehabilitation

## 2013-05-02 ENCOUNTER — Encounter: Payer: Self-pay | Admitting: Physical Medicine & Rehabilitation

## 2013-05-02 VITALS — BP 132/60 | HR 86 | Resp 14 | Ht 61.5 in | Wt 134.0 lb

## 2013-05-02 DIAGNOSIS — I771 Stricture of artery: Secondary | ICD-10-CM | POA: Insufficient documentation

## 2013-05-02 DIAGNOSIS — E039 Hypothyroidism, unspecified: Secondary | ICD-10-CM | POA: Insufficient documentation

## 2013-05-02 DIAGNOSIS — R29818 Other symptoms and signs involving the nervous system: Secondary | ICD-10-CM

## 2013-05-02 DIAGNOSIS — R29898 Other symptoms and signs involving the musculoskeletal system: Secondary | ICD-10-CM | POA: Insufficient documentation

## 2013-05-02 DIAGNOSIS — I69998 Other sequelae following unspecified cerebrovascular disease: Secondary | ICD-10-CM | POA: Insufficient documentation

## 2013-05-02 DIAGNOSIS — I639 Cerebral infarction, unspecified: Secondary | ICD-10-CM

## 2013-05-02 DIAGNOSIS — J449 Chronic obstructive pulmonary disease, unspecified: Secondary | ICD-10-CM | POA: Insufficient documentation

## 2013-05-02 DIAGNOSIS — I739 Peripheral vascular disease, unspecified: Secondary | ICD-10-CM | POA: Insufficient documentation

## 2013-05-02 DIAGNOSIS — J4489 Other specified chronic obstructive pulmonary disease: Secondary | ICD-10-CM | POA: Insufficient documentation

## 2013-05-02 DIAGNOSIS — K219 Gastro-esophageal reflux disease without esophagitis: Secondary | ICD-10-CM | POA: Insufficient documentation

## 2013-05-02 DIAGNOSIS — F17200 Nicotine dependence, unspecified, uncomplicated: Secondary | ICD-10-CM | POA: Insufficient documentation

## 2013-05-02 DIAGNOSIS — I10 Essential (primary) hypertension: Secondary | ICD-10-CM | POA: Insufficient documentation

## 2013-05-02 DIAGNOSIS — I6350 Cerebral infarction due to unspecified occlusion or stenosis of unspecified cerebral artery: Secondary | ICD-10-CM

## 2013-05-02 DIAGNOSIS — Z789 Other specified health status: Secondary | ICD-10-CM

## 2013-05-02 NOTE — Progress Notes (Signed)
Subjective:    Patient ID: [name], female    DOB: Feb 13, 1936, 78 y.o.   MRN: [id number]  HPI Return to home. Going to outpatient therapy at [place] with dressing and bathing. Her grandson stays with her But does not provide much assistance.  No falls at home  Sees primary care physician May Denies any new vision problems after stroke Local driving mainly but has driven from [place] to [facility] Pain Inventory Average Pain 3 Pain Right Now 3 My pain is aching  In the last 24 hours, has pain interfered with the following? General activity 0 Relation with others 0 Enjoyment of life 2 What TIME of day is your pain at its worst? daytime Sleep (in general) Good  Pain is worse with: walking Pain improves with: rest and medication Relief from Meds: 2  Mobility walk without assistance how many minutes can you walk? 2-5 ability to climb steps?  yes do you drive?  yes  Function retired I need assistance with the following:  household duties  Neuro/Psych bladder control problems weakness numbness tingling trouble walking confusion  Prior Studies Any changes since last visit?  no  Physicians involved in your care Any changes since last visit?  no   History reviewed. No pertinent family history. History   Social History  . Marital Status: Widowed    Spouse Name: N/A    Number of Children: N/A  . Years of Education: N/A   Social History Main Topics  . Smoking status: Former Smoker -- 0.50 packs/day for 45 years    Types: Cigarettes  . Smokeless tobacco: Current User  . Alcohol Use: No  . Drug Use: No  . Sexual Activity: No   Other Topics Concern  . None   Social History Narrative  . None   Past Surgical History  Procedure Laterality Date  . Total knee arthroplasty    . Carotid stent    . Shoulder surgery    . Abdominal hysterectomy    . Cholecystectomy    . Back surgery    . Rotator cuff repair    . Eye  surgery      cataract /lens both eyes  . Vascular surgery    . Joint replacement  [id number]    knees  . Anterior cervical decomp/discectomy fusion N/A 04/03/2012    Procedure: ANTERIOR CERVICAL DECOMPRESSION/DISCECTOMY FUSION 2 LEVELS;  Surgeon: Hosie Spangle, MD;  Location: [place] [facility];  Service: Neurosurgery;  Laterality: N/A;  Cervical four-five,Cervical five-six anterior cervical decompression with fusion plating and bonegraft  . Cholecystectomy     Past Medical History  Diagnosis Date  . Arthritis   . Hypertension   . PONV (postoperative nausea and vomiting)     severe n/v after every surgery  . Hypothyroidism   . Hypothyroid   . Peripheral vascular disease   . Osteoarthritis   . GERD (gastroesophageal reflux disease)   . Constipation due to pain medication   . COPD (chronic obstructive pulmonary disease)   . Depression    BP 132/60  Pulse 86  Resp 14  Ht 5' 1.5" (1.562 m)  Wt 134 lb (60.782 kg)  BMI 24.91 kg/m2  SpO2 93%  Opioid Risk Score:   Fall Risk Score: Moderate Fall Risk (6-13 points) (patient educated handout given)   Review of Systems  Constitutional: Positive for unexpected weight change.  Respiratory: Positive for cough.   Gastrointestinal: Positive for constipation.  Genitourinary: Positive for difficulty urinating.  Musculoskeletal: Positive for gait problem.  Neurological: Positive for weakness and numbness.  All other systems reviewed and are negative.       Objective:   Physical Exam  Motor strength is 4/5 in the left deltoid,5/5 bicep, tricep, grip  5/5 in the right deltoid, bicep, tricep, grip  5/5 in bilateral hip flexors knee extensors ankle dorsiflexors and plantar flexors  Ambulates without assistive device no toe drag were knee instability. Wide base of support  Cranial nerves II through XII intact       Assessment & Plan:  1. Small subcortical infarct no visible on CT head causing left upper extremity greater than lower  weakness. Improving with outpatient therapy. Recommended following up with neurology in [place]. Has appointment in April We'll continue to follow up with primary care physician. No physical medicine rehabilitation followup needed  Recommend that son drives with the patient to observe how her attention and concentration are

## 2013-05-02 NOTE — Patient Instructions (Signed)
Have your son drive with you If he does not feel your safe, please do not drive

## 2013-05-06 ENCOUNTER — Ambulatory Visit (HOSPITAL_COMMUNITY)
Admission: RE | Admit: 2013-05-06 | Discharge: 2013-05-06 | Disposition: A | Discharge status: Home / Self Care | Payer: Medicare HMO | Source: Ambulatory Visit | Attending: Pulmonary Disease | Admitting: Pulmonary Disease

## 2013-05-06 DIAGNOSIS — Z9181 History of falling: Secondary | ICD-10-CM

## 2013-05-06 DIAGNOSIS — Z5189 Encounter for other specified aftercare: Secondary | ICD-10-CM | POA: Diagnosis not present

## 2013-05-06 DIAGNOSIS — R29898 Other symptoms and signs involving the musculoskeletal system: Secondary | ICD-10-CM

## 2013-05-06 NOTE — Evaluation (Signed)
Physical Therapy Discharge Summary   Patient Details  Name: [name] MRN: [id number] Date of Birth: [birth year]  Today's Date: 05/06/2013 Time: 1300-1345 PT Time Calculation (min): 45 min  56 -61 TE             Visit#: 9 of 11  Re-eval:   Assessment Diagnosis: R CVA  Next MD Visit: Dr. Letta Pate MD  Prior Therapy: yes   Authorization: Medicare [employer] HMO    g code on 7th visit     Authorization Time Period:    Authorization Visit#: 9 of     Past Medical History:  Past Medical History  Diagnosis Date  . Arthritis   . Hypertension   . PONV (postoperative nausea and vomiting)     severe n/v after every surgery  . Hypothyroidism   . Hypothyroid   . Peripheral vascular disease   . Osteoarthritis   . GERD (gastroesophageal reflux disease)   . Constipation due to pain medication   . COPD (chronic obstructive pulmonary disease)   . Depression    Past Surgical History:  Past Surgical History  Procedure Laterality Date  . Total knee arthroplasty    . Carotid stent    . Shoulder surgery    . Abdominal hysterectomy    . Cholecystectomy    . Back surgery    . Rotator cuff repair    . Eye surgery      cataract /lens both eyes  . Vascular surgery    . Joint replacement  [id number]    knees  . Anterior cervical decomp/discectomy fusion N/A 04/03/2012    Procedure: ANTERIOR CERVICAL DECOMPRESSION/DISCECTOMY FUSION 2 LEVELS;  Surgeon: Hosie Spangle, MD;  Location: [facility];  Service: Neurosurgery;  Laterality: N/A;  Cervical four-five,Cervical five-six anterior cervical decompression with fusion plating and bonegraft  . Cholecystectomy      Subjective Symptoms/Limitations Symptoms: reports improved since start of rehab, has resumed prior activities except aquatics per wound on foot, did have qa fall at church sunday hitting left sdide of face and knee, she caught her left knee on ege of rows of seats , genreal body soreness post fall on sunday  Patient Stated  Goals: return to [facility] fitness class, has returned to driving  Pain Assessment Currently in Pain?: No/denies  Precautions Precautions: Fall  Balance Screen Has the patient fallen in the past 6 months: No How many times?: 1  Objective : 4 inch step up no hand support, independent walking around floor obstacles, HAnd support for tandem gait, supervision for retro gait  MMT : left hip flexion 4/5, ankle DF 4/5  Timed up and go : 13 seconds   ( 17 sec initial)  BErg balance 45/56   ( initial 44/56)   Exercise/Treatments    Balance Exercises Standing Tandem Gait: Forward;3 reps Retro Gait: 3 reps;Limitations Sidestepping: 3 reps;Limitations;Theraband Marching Limitations: 2RT with 3" holds Heel Raises: 20 reps Other Standing Exercises: obstacle course with 4 cones and 4 in step ups 5x, stairs 1 RT  Other Standing Exercises: stepping towards targets, square pattern 5X      Seated Other Seated Exercises: Nustep x 10 min, hill level 3, resistance level 3, LE only for activity tolerance and LE strengthening Other Seated Exercises: 5 x sit to stand minimal use of upper extremities         Physical Therapy Assessment and Plan PT Assessment and Plan Clinical Impression Statement: patient has met goals, states she is ready for discharge, she  did have a fall at church the other day due to catching her foot on a chiar, she has improved her gait and safely uses stairs with handrail. She does fatigue with ambulation and has been enocuraged to continue to progress her walking tolerance PT Plan: discharge, patient requested discharge at this time     Goals PT Short Term Goals Time to Complete Short Term Goals: 2 weeks PT Short Term Goal 1: patient ambulate 5 min at home without rest break  no device  PT Short Term Goal 1 - Progress: Met PT Short Term Goal 2: patient safely step on /off soft surfaces min hand support for home and community safety  PT Short Term Goal 2 - Progress: Met PT Long  Term Goals Time to Complete Long Term Goals: 4 weeks PT Long Term Goal 1: Improve BERG balance score from 44/56 to > 49/56 for decreased falls risk  PT Long Term Goal 1 - Progress: partial  Met PT Long Term Goal 2: Improve Time up and go to less than 17 seconds for decreased falls risk  - met    revised to time up and go to less than 16 seconds for community ambuation safety  PT Long Term Goal 2 - Progress: Met Long Term Goal 3: patient tolerate 20 min standing acitivites without rest break for enduracne during ADLS - new goal  met goal of 15 min  Long Term Goal 3 Progress: Met Long Term Goal 4: patient able to safely carry lgiht weight items and safely ambulate around floor obstacles for home safety  Long Term Goal 4 Progress: Met PT Long Term Goal 5: patient able to make sharp turn carrying items without loss of balance - new goal   Long Term Goal 5 Progress: Met  Problem List Patient Active Problem List   Diagnosis Date Noted  . Lack of coordination 03/21/2013  . Fine motor impairment 03/21/2013  . Left leg weakness 03/17/2013  . Risk for falls 03/17/2013  . Carotid artery disease 03/11/2013  . Subclavian artery stenosis 03/11/2013  . TIA (transient ischemic attack) 03/11/2013  . Tobacco use disorder 03/11/2013  . CVA (cerebral infarction) 03/05/2013  . Pain in joint, shoulder region 11/01/2010    PT - End of Session Equipment Utilized During Treatment: Gait belt PT Plan of Care Consulted and Agree with Plan of Care: Patient  GP Functional Assessment Tool Used: FOTO mobility  61  Functional Limitation: Mobility: Walking and moving around Mobility: Walking and Moving Around Goal Status [phone]): At least 20 percent but less than 40 percent impaired, limited or restricted Mobility: Walking and Moving Around Discharge Status [phone]): At least 20 percent but less than 40 percent impaired, limited or restricted  Radhika Dershem 05/06/2013, 2:01 PM  Physician Documentation Your  signature is required to indicate approval of the treatment plan as stated above.  Please sign and either send electronically or make a copy of this report for your files and return this physician signed original.   Please mark one 1.__approve of plan  2. ___approve of plan with the following conditions.   ______________________________                                                          _____________________ Physician Signature  Date  

## 2013-05-06 NOTE — Progress Notes (Signed)
Occupational Therapy Treatment & Discharge Summary Patient Details  Name: [name] MRN: [id number] Date of Birth: [birth year]  Today's Date: 05/06/2013 Time: 1345-1430 OT Time Calculation (min): 37 min Theract 4098-1191 24' Self-care 1415-1430 15'    Visit#: 8 of 12  Re-eval: 05/27/13 Assessment Diagnosis: CVA  Authorization: [employer]  Authorization Time Period: Before 17th visit  Authorization Visit#: 8 of 17  Subjective Symptoms/Limitations Symptoms: S: My handwriting isn't what it used to be.  Pain Assessment Currently in Pain?: No/denies  Precautions/Restrictions  Precautions Precautions: Fall  Exercise/Treatments    Activities of Daily Living Activities of Daily Living: handwriting skills worksheet completed to increase fluidity of movements and increase endurance of left hand. Patient also wrote her name, address, and phone number twice. Second time compared to first time was a significant difference due to hand fatigue.   Occupational Therapy Assessment and Plan  Assessment  ADL/Vision/Perception  ADL  ADL Comments: Pt is independent in all ADLs. Now hooks bra in front. Scrubbing back can be difficult.  Dominant Hand: Left  Sensation/Coordination/Edema  Sensation  Additional Comments: numb/tingly/aching w cold (R hand) (from prior to stroke). Baseline 4th/5th digit change in sensation  Coordination  9 Hole Peg Test: Current: Left 31.92, Right 26.19 (no complaints of fatigue) - Previous: L 46.7 seconds; R 38.13 seconds  Additional Assessments  RUE AROM (degrees)  RUE Overall AROM Comments: Proximal ROM deficits from prior surgeries. Distal grossly within functional limits.  RUE Strength  Grip (lbs): 47 (42)  Lateral Pinch: 8 lbs (8)  3 Point Pinch: 9 lbs (7)  LUE AROM (degrees)  LUE Overall AROM Comments: Proximal ROM deficits from prior surgeries. Distal grossly within functional limits.  LUE Strength  LUE Overall Strength Comments: Pt sill  has low endurance doing distal actiities, but says it has improved some. Pt was able to hem pants without fatigue.  Grip (lbs): 49 (45)  Lateral Pinch: 10 lbs (8)  3 Point Pinch: 9 lbs (7)  Right Hand Strength - Pinch (lbs)  Lateral Pinch: 8 lbs (8)  3 Point Pinch: 9 lbs (7)  Left Hand Strength - Pinch (lbs)  Lateral Pinch: 10 lbs (8)  3 Point Pinch: 9 lbs (7  OT Assessment and Plan Clinical Impression Statement: P: Patient requested to be discharge this date due to her therapy co-pay. Therapist educated patient on HEP to increase fine motor coordination and handwriting skills. Patient given green theraputty as well. Patient verablized understanding of exercises/activities. Patient was reassessed at previous session. Data was copied from previous tx session for  discharge. OT Plan: P: D/C from therapy.    Goals Short Term Goals Short Term Goal 1: Pt will be educated on HEP. Short Term Goal 2: Pt will complete a fine motor activity for at least 5 minutes with no more than 1 rest break. Short Term Goal 3: Pt will copy at least 5 sentence paragraphs with fair plus legibility. Short Term Goal 3 Progress: Not met Short Term Goal 4: Pt will report increased independence with bilateral hands in kitchen activities 50% of the time. Short Term Goal 5: Pt will don bra independently, in prior manner of donning. Long Term Goals Long Term Goal 1: Pt will return to maximum level of independence with B/IADLs and leisure activities. Long Term Goal 1 Progress: Not met Long Term Goal 2: Pt will complete crocheting activity for 20 minutes with no rest breaks. Long Term Goal 3: Pt will sign name with good legibility (line placement, line  spacing) and no reports of frustration, 100% of the time. Long Term Goal 3 Progress: Not met  Problem List Patient Active Problem List   Diagnosis Date Noted  . Lack of coordination 03/21/2013  . Fine motor impairment 03/21/2013  . Left leg weakness 03/17/2013  . Risk  for falls 03/17/2013  . Carotid artery disease 03/11/2013  . Subclavian artery stenosis 03/11/2013  . TIA (transient ischemic attack) 03/11/2013  . Tobacco use disorder 03/11/2013  . CVA (cerebral infarction) 03/05/2013  . Pain in joint, shoulder region 11/01/2010    End of Session Activity Tolerance: Patient tolerated treatment well General Behavior During Therapy: [facility] for tasks assessed/performed OT Plan of Care OT Home Exercise Plan: Celesta Aver, [facility] handout/exercises OT Patient Instructions: handout (scanned) Consulted and Agree with Plan of Care: Patient  GO Functional Assessment Tool Used: Current DASH 15.9% imparied, Previous Dash 27.27% impairment Functional Limitation: Carrying, moving and handling objects Carrying, Moving and Handling Objects Current Status (K9381): At least 1 percent but less than 20 percent impaired, limited or restricted Carrying, Moving and Handling Objects Goal Status [phone]): At least 1 percent but less than 20 percent impaired, limited or restricted Carrying, Moving and Handling Objects Discharge Status [phone]): At least 1 percent but less than 20 percent impaired, limited or restricted Ailene Ravel, OTR/L,CBIS   05/06/2013, 2:36 PM

## 2013-05-08 ENCOUNTER — Ambulatory Visit (HOSPITAL_COMMUNITY): Payer: Medicare HMO | Admitting: Specialist

## 2013-05-13 ENCOUNTER — Ambulatory Visit (HOSPITAL_COMMUNITY): Payer: Medicare HMO

## 2013-05-15 ENCOUNTER — Ambulatory Visit (HOSPITAL_COMMUNITY): Payer: Medicare HMO

## 2013-05-31 ENCOUNTER — Other Ambulatory Visit (HOSPITAL_COMMUNITY): Payer: Self-pay | Admitting: Physical Medicine and Rehabilitation

## 2013-06-01 ENCOUNTER — Other Ambulatory Visit (HOSPITAL_COMMUNITY): Payer: Self-pay | Admitting: Physical Medicine and Rehabilitation

## 2013-09-08 ENCOUNTER — Telehealth: Payer: Self-pay | Admitting: Adult Health

## 2013-09-08 NOTE — Telephone Encounter (Signed)
Had questions about meds for osteoporosis, IV meds too expensive, call PCP

## 2013-10-22 ENCOUNTER — Ambulatory Visit (HOSPITAL_COMMUNITY): Payer: PRIVATE HEALTH INSURANCE | Admitting: Physical Therapy

## 2013-11-12 ENCOUNTER — Ambulatory Visit (HOSPITAL_COMMUNITY)
Admission: RE | Admit: 2013-11-12 | Discharge: 2013-11-12 | Disposition: A | Discharge status: Home / Self Care | Payer: Medicare HMO | Source: Ambulatory Visit | Attending: Pulmonary Disease | Admitting: Pulmonary Disease

## 2013-11-12 DIAGNOSIS — IMO0001 Reserved for inherently not codable concepts without codable children: Secondary | ICD-10-CM | POA: Diagnosis present

## 2013-11-12 DIAGNOSIS — M6281 Muscle weakness (generalized): Secondary | ICD-10-CM | POA: Diagnosis not present

## 2013-11-12 DIAGNOSIS — M539 Dorsopathy, unspecified: Secondary | ICD-10-CM | POA: Insufficient documentation

## 2013-11-12 DIAGNOSIS — M25659 Stiffness of unspecified hip, not elsewhere classified: Secondary | ICD-10-CM

## 2013-11-12 DIAGNOSIS — R279 Unspecified lack of coordination: Secondary | ICD-10-CM | POA: Diagnosis not present

## 2013-11-12 DIAGNOSIS — M545 Low back pain, unspecified: Secondary | ICD-10-CM | POA: Diagnosis not present

## 2013-11-12 DIAGNOSIS — R29898 Other symptoms and signs involving the musculoskeletal system: Secondary | ICD-10-CM

## 2013-11-12 DIAGNOSIS — M5442 Lumbago with sciatica, left side: Secondary | ICD-10-CM

## 2013-11-12 DIAGNOSIS — Z9181 History of falling: Secondary | ICD-10-CM

## 2013-11-12 NOTE — Evaluation (Signed)
Physical Therapy Evaluation  Patient Details  Name: [name] MRN: [id number] Date of Birth: 08/24/1935  Today's Date: 11/12/2013 Time: 0076-2263 PT Time Calculation (min): 30 min     Charges: 1 Evaluation         Visit#: 1 of 16  Re-eval:   Assessment Diagnosis: Low back pain secondary to stiffness in hips, and weakness in bilateral LEs Next MD Visit: Dr. Letta Pate MD  Prior Therapy: yes   Authorization: Medicare A[employer]     Past Medical History:  Past Medical History  Diagnosis Date  . Arthritis   . Hypertension   . PONV (postoperative nausea and vomiting)     severe n/v after every surgery  . Hypothyroidism   . Hypothyroid   . Peripheral vascular disease   . Osteoarthritis   . GERD (gastroesophageal reflux disease)   . Constipation due to pain medication   . COPD (chronic obstructive pulmonary disease)   . Depression    Past Surgical History:  Past Surgical History  Procedure Laterality Date  . Total knee arthroplasty    . Carotid stent    . Shoulder surgery    . Abdominal hysterectomy    . Cholecystectomy    . Back surgery    . Rotator cuff repair    . Eye surgery      cataract /lens both eyes  . Vascular surgery    . Joint replacement  [id number]    knees  . Anterior cervical decomp/discectomy fusion N/A 04/03/2012    Procedure: ANTERIOR CERVICAL DECOMPRESSION/DISCECTOMY FUSION 2 LEVELS;  Surgeon: Hosie Spangle, MD;  Location: [place];  Service: Neurosurgery;  Laterality: N/A;  Cervical four-five,Cervical five-six anterior cervical decompression with fusion plating and bonegraft  . Cholecystectomy      Subjective Symptoms/Limitations Symptoms: Pain from mid// low thoracic spine through lumbar spine Pertinent History: stroke 02/27/13, went intosurgery for heart stent and complications resulted in stroke , R CVA,  patient is left handed ,  hx of B knee replacements  ,Patient had been workign out at [facility] since prior theapy in February 2015, Long  history of back pain.  How long can you sit comfortably?: <20 minutes How long can you stand comfortably?: <30 minutes How long can you walk comfortably?: <30 minutes Patient Stated Goals: to be able to bend over and sweep home without pain.  Pain Assessment Currently in Pain?: Yes Pain Score: 4  Pain Location: Back Pain Orientation: Left Pain Type: Chronic pain Pain Radiating Towards: Lt LE, deep heat.   Pain Onset: More than a month ago Pain Frequency: Intermittent Pain Relieving Factors: laying down Effect of Pain on Daily Activities: bending over, sweeping the floor, reaching into trunk of car.   Cognition/Observation Observation/Other Assessments Observations: Gait: limited hip internal rotation, excessive toe out, limited hip extension early heel off  Sensation/Coordination/Flexibility/Functional Tests Flexibility Thomas: Positive Obers: Positive 90/90: Positive (70 degrees) Functional Tests Functional Tests: Ely's test: positive,  (90 degrees) Functional Tests: Piriformis test: positive  Assessment LLE AROM (degrees) Left Hip External Rotation : 45 Left Hip Internal Rotation : 8 Left Ankle Dorsiflexion: 3 (Rt 10) LLE Strength Left Hip Flexion: 3+/5 Left Hip ABduction: 2+/5 Left Knee Flexion: 3+/5 Left Knee Extension:  (4-/5) Left Ankle Dorsiflexion:  (4+/5) Lumbar AROM Overall Lumbar AROM Comments: WNL Lumbar Flexion: to toes increases pain Lumbar Extension: 30% limited, decreases pain.  Lumbar Strength Lumbar Flexion: 3+/5 Lumbar Extension: 3+/5  Physical Therapy Assessment and Plan PT Assessment and Plan Clinical Impression  Statement: Patient is a 78 y/o female who arrives with primary complain of low back pain attributed to weakness of trunk muscles and bilateral LE 's as well as stiffness in bilateral LE resulting in limited mobility. Patient will benefit from skilled dphsyical therapy to address the above listed limited factos and return to walking in  and out of home for >47minutes without pain.  Unable to educate patient in exercises due to late arrival to therapy.  Pt will benefit from skilled therapeutic intervention in order to improve on the following deficits: Decreased balance;Abnormal gait;Decreased strength;Decreased activity tolerance;Difficulty walking;Pain;Decreased range of motion PT Plan: Initial focus on reestablishing full LE ROM and utilization of TENS for pain control per MD prescription. As pain decreases focus to shift to strengtheing of trunk and LEs.     Goals PT Short Term Goals Time to Complete Short Term Goals: 4 weeks PT Short Term Goal 1: Patient will demosntrate increased hip internal rotation of >20 degrees bilateral to improve deceleration gait mechanics PT Short Term Goal 2: Patient will demosntrate a negative Ely's test with knee at 120 degrees to improve patient's ability to squat to chair PT Short Term Goal 3: Patient will demonstrate increase trunk flexion strength of 4-/5 MMT indicating increase trunk stability.  PT Short Term Goal 4: Patient will demosntrate a negative 90/90 test on Lt LE to improve ability to touch toes without pain.  PT Short Term Goal 5: patient will dmeonstrate full lumbar extension ROM PT Long Term Goals Time to Complete Long Term Goals: 8 weeks PT Long Term Goal 1: Patient will dmeosntrate increased trunk flexion of 4/5MMT indicatign increased stability .  PT Long Term Goal 2: Patient will demosntrate increased hip internal rotation of >25 degrees bilateral to improve deceleration gait mechanics Long Term Goal 3: patient will demonstrate increased glut max/med strength of 4/5 MMT indicating increased ability to perform sit to stand withtou UE use Long Term Goal 4: Patient will be able to lift >10lb from the floor without pain to be able to perform normal home chores.   Problem List Patient Active Problem List   Diagnosis Date Noted  . Lumbago 11/12/2013  . Stiffness of joint, not  elsewhere classified, pelvic region and thigh 11/12/2013  . Muscle weakness (generalized) 11/12/2013  . Lack of coordination 03/21/2013  . Fine motor impairment 03/21/2013  . Left leg weakness 03/17/2013  . Risk for falls 03/17/2013  . Carotid artery disease 03/11/2013  . Subclavian artery stenosis 03/11/2013  . TIA (transient ischemic attack) 03/11/2013  . Tobacco use disorder 03/11/2013  . CVA (cerebral infarction) 03/05/2013  . Pain in joint, shoulder region 11/01/2010    PT - End of Session Activity Tolerance: Patient tolerated treatment well;Patient limited by fatigue General Behavior During Therapy: [facility] for tasks assessed/performed PT Plan of Care PT Home Exercise Plan: to be given next session  GP Functional Assessment Tool Used: FOTO 53% limited Functional Limitation: Carrying, moving and handling objects Carrying, Moving and Handling Objects Current Status (A8341): At least 40 percent but less than 60 percent impaired, limited or restricted Carrying, Moving and Handling Objects Goal Status [phone]): At least 20 percent but less than 40 percent impaired, limited or restricted  Leia Alf 11/12/2013, 2:58 PM  Physician Documentation Your signature is required to indicate approval of the treatment plan as stated above.  Please sign and either send electronically or make a copy of this report for your files and return this physician signed original.   Please  mark one 1.__approve of plan  2. ___approve of plan with the following conditions.   ______________________________                                                          _____________________ Physician Signature                                                                                                             Date

## 2013-11-14 ENCOUNTER — Ambulatory Visit (HOSPITAL_COMMUNITY)
Admission: RE | Admit: 2013-11-14 | Discharge: 2013-11-14 | Disposition: A | Discharge status: Home / Self Care | Payer: Medicare HMO | Source: Ambulatory Visit | Attending: Neurosurgery | Admitting: Neurosurgery

## 2013-11-14 DIAGNOSIS — IMO0001 Reserved for inherently not codable concepts without codable children: Secondary | ICD-10-CM | POA: Diagnosis not present

## 2013-11-14 NOTE — Progress Notes (Signed)
Physical Therapy Treatment Patient Details  Name: [name] MRN: [id number] Date of Birth: [birth year]  Today's Date: 11/14/2013 Time: 5277-8242 PT Time Calculation (min): 44 min Charge TE 3536-1443  Visit#: 2 of 16  Re-eval: 12/10/13    Authorization: Medicare Aetna  Authorization Time Period:    Authorization Visit#:   of     Subjective: Symptoms/Limitations Symptoms: Pain scale 3-4/10 Lt>Rt from mid thoracic spine  Pain Assessment Currently in Pain?: Yes Pain Score: 4  Pain Location: Back Pain Orientation: Mid;Lower;Left  Objective:   Exercise/Treatments Stretches Active Hamstring Stretch: 3 reps;30 seconds;Limitations Active Hamstring Stretch Limitations: 14 in box 3 direction Hip Flexor Stretch: 3 reps;30 seconds;Limitations Hip Flexor Stretch Limitations: 8in box 3 direction ITB Stretch: 2 reps;30 seconds;Limitations ITB Stretch Limitations: 6 in step Piriformis Stretch: 3 reps;30 seconds;Limitations Piriformis Stretch Limitations: seated 3 directions Standing Other Standing Lumbar Exercises: 3D hip excursion 10x Seated Other Seated Lumbar Exercises: 3D thoracic excursion 10x    Physical Therapy Assessment and Plan PT Assessment and Plan Clinical Impression Statement: Session focus on improving thoracic and hip excursion as well as instructing LE stretches to improve mobility.  Therapist facilitation for appropriate form with all activities initially then pt able to demonstrate appropiate form with all exercises.  Pt given HEP worksheet for excursion and stretches and encouraged to stretch daily and strengtthen 3-4x a week.   PT Plan: Initial focus on reestablishing full LE ROM and utilization of TENS for pain control per MD prescription. As pain decreases focus to shift to strengtheing of trunk and LEs.     Goals PT Short Term Goals PT Short Term Goal 1: Patient will demosntrate increased hip internal rotation of >20 degrees bilateral to improve  deceleration gait mechanics PT Short Term Goal 2: Patient will demosntrate a negative Ely's test with knee at 120 degrees to improve patient's ability to squat to chair PT Short Term Goal 2 - Progress: Progressing toward goal PT Short Term Goal 3: Patient will demonstrate increase trunk flexion strength of 4-/5 MMT indicating increase trunk stability.  PT Short Term Goal 4: Patient will demosntrate a negative 90/90 test on Lt LE to improve ability to touch toes without pain.  PT Short Term Goal 4 - Progress: Progressing toward goal PT Short Term Goal 5: patient will dmeonstrate full lumbar extension ROM PT Short Term Goal 5 - Progress: Progressing toward goal PT Long Term Goals PT Long Term Goal 1: Patient will dmeosntrate increased trunk flexion of 4/5MMT indicatign increased stability .  PT Long Term Goal 1 - Progress: Progressing toward goal PT Long Term Goal 2: Patient will demosntrate increased hip internal rotation of >25 degrees bilateral to improve deceleration gait mechanics Long Term Goal 3: patient will demonstrate increased glut max/med strength of 4/5 MMT indicating increased ability to perform sit to stand withtou UE use Long Term Goal 4: Patient will be able to lift >10lb from the floor without pain to be able to perform normal home chores.   Problem List Patient Active Problem List   Diagnosis Date Noted  . Lumbago 11/12/2013  . Stiffness of joint, not elsewhere classified, pelvic region and thigh 11/12/2013  . Muscle weakness (generalized) 11/12/2013  . Lack of coordination 03/21/2013  . Fine motor impairment 03/21/2013  . Left leg weakness 03/17/2013  . Risk for falls 03/17/2013  . Carotid artery disease 03/11/2013  . Subclavian artery stenosis 03/11/2013  . TIA (transient ischemic attack) 03/11/2013  . Tobacco use disorder 03/11/2013  . CVA (  cerebral infarction) 03/05/2013  . Pain in joint, shoulder region 11/01/2010    PT - End of Session Activity Tolerance:  Patient tolerated treatment well General Behavior During Therapy: [facility] for tasks assessed/performed PT Plan of Care PT Home Exercise Plan: 3D thoracic and hip excursion and LE stretches  GP    Aldona Lento 11/14/2013, 2:43 PM

## 2013-11-17 ENCOUNTER — Ambulatory Visit (HOSPITAL_COMMUNITY)
Admission: RE | Admit: 2013-11-17 | Discharge: 2013-11-17 | Disposition: A | Discharge status: Home / Self Care | Payer: Medicare HMO | Source: Ambulatory Visit | Attending: Neurosurgery | Admitting: Neurosurgery

## 2013-11-17 DIAGNOSIS — IMO0001 Reserved for inherently not codable concepts without codable children: Secondary | ICD-10-CM | POA: Diagnosis not present

## 2013-11-17 NOTE — Progress Notes (Signed)
Physical Therapy Treatment Patient Details  Name: [name] MRN: [id number] Date of Birth: [birth year]  Today's Date: 11/17/2013 Time: 9798-9211 Visit#: 2 of 16  Re-eval: 12/10/13 Authorization: Medicare [employer]  Charges:  therex 40'  Subjective: Symptoms/Limitations Symptoms: Pt states she's hurting in her mid back today, 4/10.  States she only hurts in her LB when she's mopping or sweeping. Pain Assessment Currently in Pain?: Yes Pain Score: 4  Pain Location: Back   Exercise/Treatments Stretches Active Hamstring Stretch: 3 reps;30 seconds;Limitations Active Hamstring Stretch Limitations: 14 in box 3 direction Hip Flexor Stretch: 3 reps;30 seconds;Limitations Hip Flexor Stretch Limitations: 8in box 3 direction Piriformis Stretch: 3 reps;30 seconds;Limitations Piriformis Stretch Limitations: seated 3 directions Standing Other Standing Lumbar Exercises: 3D hip excursion 10x Other Standing Lumbar Exercises: lumbar extension 10 reps Seated Other Seated Lumbar Exercises: 3D thoracic excursion 10x with UE for frontal and sagital planes Other Seated Lumbar Exercises: w-backs and UE flexion at edge of chair 10 reps     Physical Therapy Assessment and Plan PT Assessment and Plan Clinical Impression Statement: Continue to focus on increasing spinal mobility.  Pt requires therapist facilitation to complete all exercises in appropriate form, especially squats due to unequal weight distribution.  Limited UE ROM noted due to rotator cuff tears/surgeries (Lt more limited than Rt).  Pt  PT Plan: Continue to progress toward goals.  Add nustep and forward lunges next visit.  Progress strength and stability.     Problem List Patient Active Problem List   Diagnosis Date Noted  . Lumbago 11/12/2013  . Stiffness of joint, not elsewhere classified, pelvic region and thigh 11/12/2013  . Muscle weakness (generalized) 11/12/2013  . Lack of coordination 03/21/2013  . Fine motor impairment  03/21/2013  . Left leg weakness 03/17/2013  . Risk for falls 03/17/2013  . Carotid artery disease 03/11/2013  . Subclavian artery stenosis 03/11/2013  . TIA (transient ischemic attack) 03/11/2013  . Tobacco use disorder 03/11/2013  . CVA (cerebral infarction) 03/05/2013  . Pain in joint, shoulder region 11/01/2010    PT - End of Session Activity Tolerance: Patient tolerated treatment well General Behavior During Therapy: [facility] for tasks assessed/performed   Teena Irani, PTA/CLT 11/17/2013, 4:54 PM

## 2013-11-24 ENCOUNTER — Ambulatory Visit (HOSPITAL_COMMUNITY)
Admission: RE | Admit: 2013-11-24 | Discharge: 2013-11-24 | Disposition: A | Discharge status: Home / Self Care | Payer: Medicare HMO | Source: Ambulatory Visit | Attending: Pulmonary Disease | Admitting: Pulmonary Disease

## 2013-11-24 DIAGNOSIS — M6281 Muscle weakness (generalized): Secondary | ICD-10-CM | POA: Insufficient documentation

## 2013-11-24 DIAGNOSIS — M545 Low back pain: Secondary | ICD-10-CM | POA: Diagnosis not present

## 2013-11-24 DIAGNOSIS — Z5189 Encounter for other specified aftercare: Secondary | ICD-10-CM | POA: Diagnosis present

## 2013-11-24 DIAGNOSIS — R279 Unspecified lack of coordination: Secondary | ICD-10-CM | POA: Insufficient documentation

## 2013-11-24 NOTE — Progress Notes (Signed)
Physical Therapy Treatment Patient Details  Name: [name] MRN: [id number] Date of Birth: 05-Mar-1935  Today's Date: 11/24/2013 Time: 1300-1345 PT Time Calculation (min): 45 min  Visit#: 3 of 16  Re-eval: 12/10/13    Authorization: Medicare [employer]   Authorization Visit#:  3 of   20  Subjective: Symptoms/Limitations Symptoms: Pt doing her exercises at home Pain Assessment Currently in Pain?: Yes Pain Score: 3  Pain Orientation: Left   Exercise/Treatments     Stretches Active Hamstring Stretch: 3 reps;30 seconds Active Hamstring Stretch Limitations: on second stair  Hip Flexor Stretch: 3 reps;30 seconds Hip Flexor Stretch Limitations: on second stair Piriformis Stretch: 2 reps;30 seconds Aerobic Stationary Bike: nustep hills 3, L 3 x 8:00    Standing Heel Raises: 10 reps Functional Squats: 10 reps Forward Lunge: 10 reps Side Lunge: 10 reps Other Standing Lumbar Exercises: 3D hip excursion 10x Other Standing Lumbar Exercises: standing at wall with B UE flexion Seated Sit to Stand: 10 reps Other Seated Lumbar Exercises: w-backs and UE flexion at edge of chair 10 reps 2 #     Physical Therapy Assessment and Plan PT Assessment and Plan Clinical Impression Statement: Added multiple weight bearing exercises to increase LE strength with therapist facilitation to keep core tight.  Pt improving in ability to realize when spine is not in alignment.  PT Plan: introduce balance activites while keeping core strong next treatment.        Problem List Patient Active Problem List   Diagnosis Date Noted  . Lumbago 11/12/2013  . Stiffness of joint, not elsewhere classified, pelvic region and thigh 11/12/2013  . Muscle weakness (generalized) 11/12/2013  . Lack of coordination 03/21/2013  . Fine motor impairment 03/21/2013  . Left leg weakness 03/17/2013  . Risk for falls 03/17/2013  . Carotid artery disease 03/11/2013  . Subclavian artery stenosis 03/11/2013  .  TIA (transient ischemic attack) 03/11/2013  . Tobacco use disorder 03/11/2013  . CVA (cerebral infarction) 03/05/2013  . Pain in joint, shoulder region 11/01/2010       GP    Melanie Pellot,CINDY 11/24/2013, 1:46 PM

## 2013-11-26 ENCOUNTER — Ambulatory Visit (HOSPITAL_COMMUNITY)
Admission: RE | Admit: 2013-11-26 | Discharge: 2013-11-26 | Disposition: A | Discharge status: Home / Self Care | Payer: Medicare HMO | Source: Ambulatory Visit | Attending: Pulmonary Disease | Admitting: Pulmonary Disease

## 2013-11-26 DIAGNOSIS — Z5189 Encounter for other specified aftercare: Secondary | ICD-10-CM | POA: Diagnosis not present

## 2013-11-26 NOTE — Progress Notes (Signed)
Physical Therapy Treatment Patient Details  Name: LYNESHA BANGO MRN: [id number] Date of Birth: 1935/08/28  Today's Date: 11/26/2013 Time: 1300-1345 PT Time Calculation (min): 45 min  Charges: TE [id number] Visit#: 4 of 16  Re-eval: 12/10/13 Assessment Diagnosis: Low back pain secondary to stiffness in hips, and weakness in bilateral LEs Next MD Visit: Dr. Letta Pate MD  Prior Therapy: yes   Authorization:   [employer] Medicare  Subjective: Symptoms/Limitations Symptoms: Patent notes conitnued low back pain just below bra line Pain Assessment Currently in Pain?: Yes Pain Score: 3  Pain Location: Back  Exercise/Treatments Stretches Active Hamstring Stretch: 3 reps;30 seconds Active Hamstring Stretch Limitations: on 14" box 3 ways Quad Stretch: 2 reps;20 seconds Aerobic Stationary Bike: nustep hills 3, L 3 x 10:00 intervals Standing Heel Raises: 10 reps;Limitations Heel Raises Limitations: heel toe raises Functional Squats: Limitations Functional Squats Limitations: squat matrix 5x each Other Standing Lumbar Exercises: 3D hip excursion 10x Seated Other Seated Lumbar Exercises: 3D thoacic spine excursion 10x with therapist assist for sagittal plane motions.  Quadruped Plank: 2D UE weight shift: cancelled follwoing increas in shoulder/elbow pain.   Physical Therapy Assessment and Plan PT Assessment and Plan Clinical Impression Statement: Added multiplanar squatting with patient requiring cuing throughout performance to increase glut activation and weight shifting onto heels. patient demosntrates good performance of stretches. noted pain and limited ROm with all UE driven motions for strengtheing back. Therapy to conitnue focu on increasing Lumbar spine and hip AROM  and strength with use of LE driven exercises.  PT Plan: Conitnue stretches. Progress lunging and LE loadfing activities with cuing for glut activation.     Goals PT Short Term Goals PT Short Term Goal 1: Patient  will demosntrate increased hip internal rotation of >20 degrees bilateral to improve deceleration gait mechanics PT Short Term Goal 1 - Progress: Progressing toward goal PT Short Term Goal 2: Patient will demosntrate a negative Ely's test with knee at 120 degrees to improve patient's ability to squat to chair PT Short Term Goal 2 - Progress: Progressing toward goal PT Short Term Goal 3: Patient will demonstrate increase trunk flexion strength of 4-/5 MMT indicating increase trunk stability.  PT Short Term Goal 3 - Progress: Progressing toward goal PT Short Term Goal 4: Patient will demosntrate a negative 90/90 test on Lt LE to improve ability to touch toes without pain.  PT Short Term Goal 4 - Progress: Progressing toward goal PT Short Term Goal 5: patient will dmeonstrate full lumbar extension ROM PT Short Term Goal 5 - Progress: Progressing toward goal PT Long Term Goals PT Long Term Goal 1: Patient will dmeosntrate increased trunk flexion of 4/5MMT indicatign increased stability .  PT Long Term Goal 1 - Progress: Progressing toward goal PT Long Term Goal 2: Patient will demosntrate increased hip internal rotation of >25 degrees bilateral to improve deceleration gait mechanics PT Long Term Goal 2 - Progress: Progressing toward goal Long Term Goal 3: patient will demonstrate increased glut max/med strength of 4/5 MMT indicating increased ability to perform sit to stand withtou UE use Long Term Goal 3 Progress: Progressing toward goal Long Term Goal 4: Patient will be able to lift >10lb from the floor without pain to be able to perform normal home chores.  Long Term Goal 4 Progress: Progressing toward goal  Problem List Patient Active Problem List   Diagnosis Date Noted  . Lumbago 11/12/2013  . Stiffness of joint, not elsewhere classified, pelvic region and thigh 11/12/2013  .  Muscle weakness (generalized) 11/12/2013  . Lack of coordination 03/21/2013  . Fine motor impairment 03/21/2013   . Left leg weakness 03/17/2013  . Risk for falls 03/17/2013  . Carotid artery disease 03/11/2013  . Subclavian artery stenosis 03/11/2013  . TIA (transient ischemic attack) 03/11/2013  . Tobacco use disorder 03/11/2013  . CVA (cerebral infarction) 03/05/2013  . Pain in joint, shoulder region 11/01/2010    General Behavior During Therapy: [facility] for tasks assessed/performed  GP    Johnika Escareno R 11/26/2013, 1:49 PM

## 2013-12-01 ENCOUNTER — Ambulatory Visit (HOSPITAL_COMMUNITY): Payer: PRIVATE HEALTH INSURANCE

## 2013-12-03 ENCOUNTER — Ambulatory Visit (HOSPITAL_COMMUNITY): Payer: PRIVATE HEALTH INSURANCE | Admitting: Physical Therapy

## 2013-12-08 ENCOUNTER — Ambulatory Visit (HOSPITAL_COMMUNITY)
Admission: RE | Admit: 2013-12-08 | Discharge: 2013-12-08 | Disposition: A | Discharge status: Home / Self Care | Payer: Medicare HMO | Source: Ambulatory Visit | Attending: Physical Therapy | Admitting: Physical Therapy

## 2013-12-08 DIAGNOSIS — Z5189 Encounter for other specified aftercare: Secondary | ICD-10-CM | POA: Diagnosis not present

## 2013-12-08 NOTE — Progress Notes (Signed)
Physical Therapy Treatment Patient Details  Name: [name] MRN: [id number] Date of Birth: 1935/03/15  Today's Date: 12/08/2013 Time: 1203-1253 PT Time Calculation (min): 50 min Charge: TE [id number]  Visit#: 5 of 16  Re-eval: 12/10/13 Assessment Diagnosis: Low back pain secondary to stiffness in hips, and weakness in bilateral LEs Next MD Visit: Dr. Letta Pate MD  Prior Therapy: yes   Authorization: Medicare A[employer]  Authorization Time Period:    Authorization Visit#:   of     Subjective: Symptoms/Limitations Symptoms: Pt back from beach, stated she was cold today.  Current pain scale 3/10 center of back Pain Assessment Currently in Pain?: Yes Pain Score: 3  Pain Location: Back Pain Orientation: Lower  Objective:   Exercise/Treatments Stretches Active Hamstring Stretch: 3 reps;30 seconds Active Hamstring Stretch Limitations: on 14" box 3 ways Hip Flexor Stretch: 3 reps;30 seconds Hip Flexor Stretch Limitations: on second stair Standing Side Bend: Limitations Standing Side Bend Limitations: 10 Bil with 2#  Piriformis Stretch: 2 reps;30 seconds Piriformis Stretch Limitations: seated 3 directions Aerobic Stationary Bike: nustep hills 4, L 3 x 10:00 intervals Standing Heel Raises: 10 reps;Limitations Heel Raises Limitations: heel toe raises Functional Squats: Limitations Functional Squats Limitations: squat matrix 5x each Forward Lunge: 10 reps (6 in box) Side Lunge: 10 reps (4in box) Other Standing Lumbar Exercises: 3D hip excursion 10x Other Standing Lumbar Exercises: gastroc st 3x 30" Seated Other Seated Lumbar Exercises: 3D thoacic spine excursion 10x with therapist assist for sagittal plane motions.      Physical Therapy Assessment and Plan PT Assessment and Plan Clinical Impression Statement: Continued stretches to improve LE mobiltiy.  Resumed lunges for gluteal strengthening with therapist facilitation for form and technique.  Continued with  multiplaner squats for gluteal strengthening.  No reports of increased pain through session.  Therapist facilitation with Lt LE with thoracic excursion to improve extension.   PT Plan: Conitnue stretches. Progress lunging and LE loadfing activities with cuing for glut activation.     Goals PT Short Term Goals PT Short Term Goal 1: Patient will demosntrate increased hip internal rotation of >20 degrees bilateral to improve deceleration gait mechanics PT Short Term Goal 1 - Progress: Progressing toward goal PT Short Term Goal 2: Patient will demosntrate a negative Ely's test with knee at 120 degrees to improve patient's ability to squat to chair PT Short Term Goal 2 - Progress: Progressing toward goal PT Short Term Goal 3: Patient will demonstrate increase trunk flexion strength of 4-/5 MMT indicating increase trunk stability.  PT Short Term Goal 3 - Progress: Progressing toward goal PT Short Term Goal 4: Patient will demosntrate a negative 90/90 test on Lt LE to improve ability to touch toes without pain.  PT Short Term Goal 4 - Progress: Progressing toward goal PT Short Term Goal 5: patient will dmeonstrate full lumbar extension ROM PT Short Term Goal 5 - Progress: Progressing toward goal PT Long Term Goals PT Long Term Goal 1: Patient will dmeosntrate increased trunk flexion of 4/5MMT indicatign increased stability .  PT Long Term Goal 1 - Progress: Progressing toward goal PT Long Term Goal 2: Patient will demosntrate increased hip internal rotation of >25 degrees bilateral to improve deceleration gait mechanics Long Term Goal 3: patient will demonstrate increased glut max/med strength of 4/5 MMT indicating increased ability to perform sit to stand withtou UE use Long Term Goal 4: Patient will be able to lift >10lb from the floor without pain to be able to perform normal  home chores.   Problem List Patient Active Problem List   Diagnosis Date Noted  . Lumbago 11/12/2013  . Stiffness of  joint, not elsewhere classified, pelvic region and thigh 11/12/2013  . Muscle weakness (generalized) 11/12/2013  . Lack of coordination 03/21/2013  . Fine motor impairment 03/21/2013  . Left leg weakness 03/17/2013  . Risk for falls 03/17/2013  . Carotid artery disease 03/11/2013  . Subclavian artery stenosis 03/11/2013  . TIA (transient ischemic attack) 03/11/2013  . Tobacco use disorder 03/11/2013  . CVA (cerebral infarction) 03/05/2013  . Pain in joint, shoulder region 11/01/2010    PT - End of Session Activity Tolerance: Patient tolerated treatment well General Behavior During Therapy: [facility] for tasks assessed/performed  GP    Aldona Lento 12/08/2013, 2:01 PM

## 2013-12-10 ENCOUNTER — Ambulatory Visit (HOSPITAL_COMMUNITY)
Admission: RE | Admit: 2013-12-10 | Discharge: 2013-12-10 | Disposition: A | Discharge status: Home / Self Care | Payer: Medicare HMO | Source: Ambulatory Visit | Attending: Neurosurgery | Admitting: Neurosurgery

## 2013-12-10 DIAGNOSIS — Z5189 Encounter for other specified aftercare: Secondary | ICD-10-CM | POA: Diagnosis not present

## 2013-12-10 NOTE — Progress Notes (Signed)
Physical Therapy Re-evaluation/Treatment Note  Patient Details  Name: [name] MRN: [id number] Date of Birth: [birth year]  Today's Date: 12/10/2013 Time: 1300-1355 PT Time Calculation (min): 55 min Charge: TE 1300-1330, 1345-1355, MMT/ROM Measurement 1330-1345              Visit#: 6 of 16  Re-eval: 01/07/14 Assessment Diagnosis: Low back pain secondary to stiffness in hips, and weakness in bilateral LEs Next MD Visit: Dr. Letta Pate MD  12/12/2013 Prior Therapy: yes   Authorization: Medicare A[employer]    Authorization Time Period:    Authorization Visit#:   of     Subjective Symptoms/Limitations Symptoms: Feeling good today, minimal back pain today.  Just came from [place].  Pain scale 2/10 across lower back and thoracic region.   How long can you sit comfortably?: <20 minutes How long can you stand comfortably?: Able to stand for dish washing then has to sit down <30 minutes How long can you walk comfortably?: Able to walk through [place] for 30 minutes <30 minutes Pain Assessment Currently in Pain?: Yes Pain Score: 2  Pain Location: Back Pain Orientation: Mid;Lower  Cognition/Observation Observation/Other Assessments Other Assessments: FOTO 47% (was 59.28)  Sensation/Coordination/Flexibility/Functional Tests Flexibility Thomas: Positive (was positive) Obers: Positive (was positive) 90/90: Negative (was positive) Functional Tests Functional Tests: Ely's test: positive,  (Ely's test: positive, ) Functional Tests: Piriformis test: positive (Piriformis test: positive)  Assessment RLE Strength Right Hip Flexion: 3+/5 Right Hip Extension: 3+/5 Right Hip ABduction: 4/5 LLE AROM (degrees) Left Hip External Rotation : 68 (was 45) Left Hip Internal Rotation : 50 (was 8) Left Ankle Dorsiflexion: 20 (was 3) LLE Strength Left Hip Flexion:  (4was 3+/5) Left Hip Extension: 3/5 Left Hip ABduction: 3+/5 (was 2+/5) Left Knee Flexion:  (4-/5 was 3+/5) Left Knee  Extension: 5/5 (was 4-/5) Left Ankle Dorsiflexion:  (4+/5 was 4+/5) Lumbar AROM Overall Lumbar AROM Comments: WNL Lumbar Flexion: fingers to toes, 3 in frrom floor increased pain (to toes increases pain) Lumbar Extension: 40 degress (30% limited, decreases pain. ) Lumbar - Right Side Bend: 18 1/2 in from floor Lumbar - Left Side Bend: 19 1/2 in from floor Lumbar Strength Lumbar Flexion: 4/5 (was 3+/5) Lumbar Extension: 3+/5 (was 3+/5)  Exercise/Treatments Stretches Active Hamstring Stretch: 3 reps;30 seconds Active Hamstring Stretch Limitations: on 14" box 3 ways Hip Flexor Stretch: 3 reps;30 seconds Hip Flexor Stretch Limitations: on second stair Standing Side Bend: Limitations Standing Side Bend Limitations: 10 Bil with 2#  Standing Extension: 5 reps;10 seconds Piriformis Stretch: 2 reps;30 seconds Piriformis Stretch Limitations: seated 3 directions Aerobic Stationary Bike: nustep hills 4, L 3 x 10:00 intervals Standing Other Standing Lumbar Exercises: 3D hip excursion 10x Other Standing Lumbar Exercises: gastroc st 3x 30" Seated Other Seated Lumbar Exercises: 3D thoacic spine excursion 10x with therapist assist for sagittal plane motions.     Physical Therapy Assessment and Plan PT Assessment and Plan Clinical Impression Statement: Reassessment complete with the following findings:  Pt reports complaince with HEP 2x a week and goes swimming at [facility] 3-4x a week.  Pt current pain scale 2/10.  Pt stated her tolerance for sitting, standing and walking are 30 minutes.  Vast improvements with hip mobility especially IR and ER Bil LE.  Overall strength is progressings.  Improved percieved functional abilith with gain in FOTO from 47% to 59.28%.  Pt will continue to benefit from skilled internveiton to improve lumbar extension, LE strenghtening and improve functional strengthening PT Plan: Recommend continuing OPPT for  4 more weeks to improve LE mobiility and LE strengthening towards  goals unmet.      Goals Home Exercise Program PT Goal: Perform Home Exercise Program - Progress: Met (2x a week, swim at [facility] 3-4x) PT Short Term Goals PT Short Term Goal 1: Patient will demosntrate increased hip internal rotation of >20 degrees bilateral to improve deceleration gait mechanics PT Short Term Goal 1 - Progress: Met PT Short Term Goal 2: Patient will demosntrate a negative Ely's test with knee at 120 degrees to improve patient's ability to squat to chair PT Short Term Goal 2 - Progress: Progressing toward goal PT Short Term Goal 3: Patient will demonstrate increase trunk flexion strength of 4-/5 MMT indicating increase trunk stability.  PT Short Term Goal 3 - Progress: Met PT Short Term Goal 4: Patient will demosntrate a negative 90/90 test on Lt LE to improve ability to touch toes without pain.  PT Short Term Goal 4 - Progress: Partly met PT Short Term Goal 5: patient will dmeonstrate full lumbar extension ROM PT Short Term Goal 5 - Progress: Progressing toward goal PT Long Term Goals PT Long Term Goal 1: Patient will dmeosntrate increased trunk flexion of 4/5MMT indicatign increased stability .  PT Long Term Goal 1 - Progress: Progressing toward goal PT Long Term Goal 2: Patient will demosntrate increased hip internal rotation of >25 degrees bilateral to improve deceleration gait mechanics PT Long Term Goal 2 - Progress: Met Long Term Goal 3: patient will demonstrate increased glut max/med strength of 4/5 MMT indicating increased ability to perform sit to stand withtou UE use Long Term Goal 3 Progress: Progressing toward goal Long Term Goal 4: Patient will be able to lift >10lb from the floor without pain to be able to perform normal home chores.  Long Term Goal 4 Progress: Partly met  Problem List Patient Active Problem List   Diagnosis Date Noted  . Lumbago 11/12/2013  . Stiffness of joint, not elsewhere classified, pelvic region and thigh 11/12/2013  . Muscle  weakness (generalized) 11/12/2013  . Lack of coordination 03/21/2013  . Fine motor impairment 03/21/2013  . Left leg weakness 03/17/2013  . Risk for falls 03/17/2013  . Carotid artery disease 03/11/2013  . Subclavian artery stenosis 03/11/2013  . TIA (transient ischemic attack) 03/11/2013  . Tobacco use disorder 03/11/2013  . CVA (cerebral infarction) 03/05/2013  . Pain in joint, shoulder region 11/01/2010    PT - End of Session Activity Tolerance: Patient tolerated treatment well General Behavior During Therapy: [facility] for tasks assessed/performed  GP Functional Assessment Tool Used: FOTO 40% limited (was 53% limited) Functional Limitation: Carrying, moving and handling objects Carrying, Moving and Handling Objects Current Status ([id number]): At least 20 percent but less than 40 percent impaired, limited or restricted Carrying, Moving and Handling Objects Goal Status [phone]): At least 20 percent but less than 40 percent impaired, limited or restricted  Aldona Lento 12/10/2013, 4:31 PM  Physician Documentation Your signature is required to indicate approval of the treatment plan as stated above.  Please sign and either send electronically or make a copy of this report for your files and return this physician signed original.   Please mark one 1.__approve of plan  2. ___approve of plan with the following conditions.   ______________________________  _____________________ Physician Signature                                                                                                             Date

## 2013-12-10 NOTE — Progress Notes (Signed)
Physical Therapy Re-evaluation/Treatment Note  Patient Details  Name: [name] MRN: [id number] Date of Birth: 1935-08-12  Today's Date: 12/10/2013 Time: 1300-1355 PT Time Calculation (min): 55 min Charge: TE 1300-1330, 1345-1355, MMT/ROM Measurement 1330-1345              Visit#: 6 of 16  Re-eval: 01/07/14 Assessment Diagnosis: Low back pain secondary to stiffness in hips, and weakness in bilateral LEs Next MD Visit: Dr. Letta Pate MD  12/12/2013 Prior Therapy: yes   Authorization: Medicare Aetna     Subjective Symptoms/Limitations Symptoms: Feeling good today, minimal back pain today.  Just came from [place].  Pain scale 2/10 across lower back and thoracic region.   How long can you sit comfortably?: <20 minutes How long can you stand comfortably?: Able to stand for dish washing then has to sit down <30 minutes How long can you walk comfortably?: Able to walk through [place] for 30 minutes <30 minutes Pain Assessment Currently in Pain?: Yes Pain Score: 2  Pain Location: Back Pain Orientation: Mid;Lower  Cognition/Observation Observation/Other Assessments Other Assessments: FOTO 47% (was 59.28)  Sensation/Coordination/Flexibility/Functional Tests Flexibility Thomas: Positive (was positive) Obers: Positive (was positive) 90/90: Negative (was positive) Functional Tests Functional Tests: Ely's test: positive,  (Ely's test: positive, ) Functional Tests: Piriformis test: positive (Piriformis test: positive)  Assessment RLE Strength Right Hip Flexion: 3+/5 Right Hip Extension: 3+/5 Right Hip ABduction: 4/5 LLE AROM (degrees) Left Hip External Rotation : 68 (was 45) Left Hip Internal Rotation : 50 (was 8) Left Ankle Dorsiflexion: 20 (was 3) LLE Strength Left Hip Flexion:  (4was 3+/5) Left Hip Extension: 3/5 Left Hip ABduction: 3+/5 (was 2+/5) Left Knee Flexion:  (4-/5 was 3+/5) Left Knee Extension: 5/5 (was 4-/5) Left Ankle Dorsiflexion:  (4+/5 was  4+/5) Lumbar AROM Overall Lumbar AROM Comments: WNL Lumbar Flexion: fingers to toes, 3 in frrom floor increased pain (to toes increases pain) Lumbar Extension: 40 degress (30% limited, decreases pain. ) Lumbar - Right Side Bend: 18 1/2 in from floor Lumbar - Left Side Bend: 19 1/2 in from floor Lumbar Strength Lumbar Flexion: 4/5 (was 3+/5) Lumbar Extension: 3+/5 (was 3+/5)  Exercise/Treatments Stretches Active Hamstring Stretch: 3 reps;30 seconds Active Hamstring Stretch Limitations: on 14" box 3 ways Hip Flexor Stretch: 3 reps;30 seconds Hip Flexor Stretch Limitations: on second stair Standing Side Bend: Limitations Standing Side Bend Limitations: 10 Bil with 2#  Standing Extension: 5 reps;10 seconds Piriformis Stretch: 2 reps;30 seconds Piriformis Stretch Limitations: seated 3 directions Aerobic Stationary Bike: nustep hills 4, L 3 x 10:00 intervals Standing Other Standing Lumbar Exercises: 3D hip excursion 10x Other Standing Lumbar Exercises: gastroc st 3x 30" Seated Other Seated Lumbar Exercises: 3D thoacic spine excursion 10x with therapist assist for sagittal plane motions.     Physical Therapy Assessment and Plan PT Assessment and Plan Clinical Impression Statement: Reassessment complete with the following findings:  Pt reports complaince with HEP 2x a week and goes swimming at [facility] 3-4x a week.  Pt current pain scale 2/10.  Pt stated her tolerance for sitting, standing and walking are 30 minutes.  Vast improvements with hip mobility especially IR and ER Bil LE.  Overall strength is progressings.  Improved percieved functional abilith with gain in FOTO from 47% to 59.28%.  Pt will continue to benefit from skilled internveiton to improve lumbar extension, LE strenghtening and improve functional strengthening PT Plan: Recommend continuing OPPT for 4 more weeks to improve LE mobiility and LE strengthening towards goals unmet.  Goals Home Exercise Program PT Goal:  Perform Home Exercise Program - Progress: Met (2x a week, swim at [facility] 3-4x) PT Short Term Goals PT Short Term Goal 1: Patient will demosntrate increased hip internal rotation of >20 degrees bilateral to improve deceleration gait mechanics PT Short Term Goal 1 - Progress: Met PT Short Term Goal 2: Patient will demosntrate a negative Ely's test with knee at 120 degrees to improve patient's ability to squat to chair PT Short Term Goal 2 - Progress: Progressing toward goal PT Short Term Goal 3: Patient will demonstrate increase trunk flexion strength of 4-/5 MMT indicating increase trunk stability.  PT Short Term Goal 3 - Progress: Met PT Short Term Goal 4: Patient will demosntrate a negative 90/90 test on Lt LE to improve ability to touch toes without pain.  PT Short Term Goal 4 - Progress: Partly met PT Short Term Goal 5: patient will dmeonstrate full lumbar extension ROM PT Short Term Goal 5 - Progress: Progressing toward goal PT Long Term Goals PT Long Term Goal 1: Patient will dmeosntrate increased trunk flexion of 4/5MMT indicatign increased stability .  PT Long Term Goal 1 - Progress: Progressing toward goal PT Long Term Goal 2: Patient will demosntrate increased hip internal rotation of >25 degrees bilateral to improve deceleration gait mechanics PT Long Term Goal 2 - Progress: Met Long Term Goal 3: patient will demonstrate increased glut max/med strength of 4/5 MMT indicating increased ability to perform sit to stand withtou UE use Long Term Goal 3 Progress: Progressing toward goal Long Term Goal 4: Patient will be able to lift >10lb from the floor without pain to be able to perform normal home chores.  Long Term Goal 4 Progress: Partly met  Problem List Patient Active Problem List   Diagnosis Date Noted  . Lumbago 11/12/2013  . Stiffness of joint, not elsewhere classified, pelvic region and thigh 11/12/2013  . Muscle weakness (generalized) 11/12/2013  . Lack of coordination  03/21/2013  . Fine motor impairment 03/21/2013  . Left leg weakness 03/17/2013  . Risk for falls 03/17/2013  . Carotid artery disease 03/11/2013  . Subclavian artery stenosis 03/11/2013  . TIA (transient ischemic attack) 03/11/2013  . Tobacco use disorder 03/11/2013  . CVA (cerebral infarction) 03/05/2013  . Pain in joint, shoulder region 11/01/2010    PT - End of Session Activity Tolerance: Patient tolerated treatment well General Behavior During Therapy: [facility] for tasks assessed/performed  GP Functional Assessment Tool Used: FOTO 40% limited (was 53% limited) Functional Limitation: Carrying, moving and handling objects Carrying, Moving and Handling Objects Current Status (Q2229): At least 20 percent but less than 40 percent impaired, limited or restricted Carrying, Moving and Handling Objects Goal Status [phone]): At least 20 percent but less than 40 percent impaired, limited or restricted  Aldona Lento 12/10/2013, 4:31 PM  Devona Konig PT DPT  Physician Documentation Your signature is required to indicate approval of the treatment plan as stated above.  Please sign and either send electronically or make a copy of this report for your files and return this physician signed original.   Please mark one 1.__approve of plan  2. ___approve of plan with the following conditions.   ______________________________  _____________________ Physician Signature                                                                                                             Date

## 2013-12-15 ENCOUNTER — Ambulatory Visit (HOSPITAL_COMMUNITY)
Admission: RE | Admit: 2013-12-15 | Discharge: 2013-12-15 | Disposition: A | Discharge status: Home / Self Care | Payer: Medicare HMO | Source: Ambulatory Visit | Attending: Pulmonary Disease | Admitting: Pulmonary Disease

## 2013-12-15 DIAGNOSIS — Z5189 Encounter for other specified aftercare: Secondary | ICD-10-CM | POA: Diagnosis not present

## 2013-12-15 NOTE — Progress Notes (Signed)
Physical Therapy Treatment Patient Details  Name: ANJA NEUZIL MRN: [id number] Date of Birth: 01-06-1936  Today's Date: 12/15/2013 Time: 3546-5681 PT Time Calculation (min): 39 min  Visit#: 7 of 16  Re-eval: 01/07/14 Authorization: Medicare Aetna  Authorization Visit#: 7 of 16  Charges:  therex 38  Subjective: Symptoms/Limitations Symptoms: PT states she is not having any pain today.  STates she just left the [employer] and worked on ROM and strengthening today. Pain Assessment Currently in Pain?: No/denies   Exercise/Treatments Stretches Active Hamstring Stretch: 3 reps;30 seconds Active Hamstring Stretch Limitations: on 14" box 3 ways Hip Flexor Stretch: 3 reps;30 seconds Hip Flexor Stretch Limitations: on second stair Standing Side Bend: Limitations Standing Side Bend Limitations: 10 Bil with 2#  Piriformis Stretch: 2 reps;30 seconds Piriformis Stretch Limitations: seated 3 directions Aerobic Stationary Bike: nustep hills 4, L 3 x 10:00 intervals Standing Other Standing Lumbar Exercises: 3D hip excursion 10x Other Standing Lumbar Exercises: gastroc st 3x 30" Seated Other Seated Lumbar Exercises: 3D thoracic spine excursion 10x with therapist assist for sagittal plane motions.     Physical Therapy Assessment and Plan PT Assessment and Plan Clinical Impression Statement: Continued with POC with focus on increasing ROM and strength.  Pt able to complete with therapist facilitation for form and posture.  PT Plan: Continue to improve LE mobiility and strength.       Problem List Patient Active Problem List   Diagnosis Date Noted  . Lumbago 11/12/2013  . Stiffness of joint, not elsewhere classified, pelvic region and thigh 11/12/2013  . Muscle weakness (generalized) 11/12/2013  . Lack of coordination 03/21/2013  . Fine motor impairment 03/21/2013  . Left leg weakness 03/17/2013  . Risk for falls 03/17/2013  . Carotid artery disease 03/11/2013  . Subclavian artery  stenosis 03/11/2013  . TIA (transient ischemic attack) 03/11/2013  . Tobacco use disorder 03/11/2013  . CVA (cerebral infarction) 03/05/2013  . Pain in joint, shoulder region 11/01/2010    PT - End of Session Activity Tolerance: Patient tolerated treatment well General Behavior During Therapy: [facility] for tasks assessed/performed  GP Functional Limitation: Carrying, moving and handling objects  Teena Irani, PTA/CLT 12/15/2013, 1:37 PM

## 2013-12-17 ENCOUNTER — Ambulatory Visit (HOSPITAL_COMMUNITY)
Admission: RE | Admit: 2013-12-17 | Discharge: 2013-12-17 | Disposition: A | Discharge status: Home / Self Care | Payer: Medicare HMO | Source: Ambulatory Visit | Attending: Pulmonary Disease | Admitting: Pulmonary Disease

## 2013-12-17 DIAGNOSIS — Z5189 Encounter for other specified aftercare: Secondary | ICD-10-CM | POA: Diagnosis not present

## 2013-12-17 NOTE — Progress Notes (Signed)
Physical Therapy Treatment Patient Details  Name: [name] MRN: [id number] Date of Birth: December 01, 1935  Today's Date: 12/17/2013 Time: 1103-1594 PT Time Calculation (min): 29 min   Charges: TE [id number] Visit#: 8 of 16  Re-eval: 01/07/14 Assessment Diagnosis: Low back pain secondary to stiffness in hips, and weakness in bilateral LEs Next MD Visit: Dr. Letta Pate MD  12/12/2013 Prior Therapy: yes   Authorization: Medicare Aetna  Authorization Visit#: 8 of 16   Subjective: Symptoms/Limitations Symptoms: "today is a bad day with the weather. I feel i am having more pain today, but overall i am not sure if I am improving." bnotes she was not hurting at all last session, "its the damn weather."  Pain Assessment Currently in Pain?: Yes Pain Score: 5   Exercise/Treatments Stretches Hip Flexor Stretch: 3 reps;30 seconds Hip Flexor Stretch Limitations: on second stair Piriformis Stretch: 2 reps;30 seconds Piriformis Stretch Limitations: seated 3 directions Standing Forward Lunge: 10 reps (6 in box) Side Lunge:  (4in box) Other Standing Lumbar Exercises: 3D hip excursion 15x Seated Other Seated Lumbar Exercises: 3D thoracic spine excursion with blue ball 10x with therapist assist for sagittal plane motions.   Physical Therapy Assessment and Plan PT Assessment and Plan Clinical Impression Statement: Patient noted no increase of pain during session except during forward flexion of trunk exercises for which therapy limited to promote improved pain. following hip and trunk strenghtieng exercises patient noted improved pain . Patient arrived late to therapy limiting exercises.  PT Plan: Focus on improvign lumbar spine stability and strength to improve ability to control spine during forward bending exercises.    Goals PT Short Term Goals PT Short Term Goal 2: Patient will demosntrate a negative Ely's test with knee at 120 degrees to improve patient's ability to squat to chair PT  Short Term Goal 2 - Progress: Progressing toward goal PT Short Term Goal 5: patient will dmeonstrate full lumbar extension ROM PT Short Term Goal 5 - Progress: Progressing toward goal PT Long Term Goals PT Long Term Goal 1: Patient will dmeosntrate increased trunk flexion of 4/5MMT indicatign increased stability .  PT Long Term Goal 1 - Progress: Progressing toward goal Long Term Goal 3: patient will demonstrate increased glut max/med strength of 4/5 MMT indicating increased ability to perform sit to stand withtou UE use Long Term Goal 3 Progress: Progressing toward goal  Problem List Patient Active Problem List   Diagnosis Date Noted  . Lumbago 11/12/2013  . Stiffness of joint, not elsewhere classified, pelvic region and thigh 11/12/2013  . Muscle weakness (generalized) 11/12/2013  . Lack of coordination 03/21/2013  . Fine motor impairment 03/21/2013  . Left leg weakness 03/17/2013  . Risk for falls 03/17/2013  . Carotid artery disease 03/11/2013  . Subclavian artery stenosis 03/11/2013  . TIA (transient ischemic attack) 03/11/2013  . Tobacco use disorder 03/11/2013  . CVA (cerebral infarction) 03/05/2013  . Pain in joint, shoulder region 11/01/2010    PT - End of Session Activity Tolerance: Patient tolerated treatment well  GP    Lajean Boese R 12/17/2013, 1:43 PM

## 2013-12-22 ENCOUNTER — Ambulatory Visit (HOSPITAL_COMMUNITY)
Admission: RE | Admit: 2013-12-22 | Discharge: 2013-12-22 | Disposition: A | Discharge status: Home / Self Care | Payer: Medicare HMO | Source: Ambulatory Visit | Attending: Neurosurgery | Admitting: Neurosurgery

## 2013-12-22 ENCOUNTER — Ambulatory Visit (HOSPITAL_COMMUNITY): Payer: Medicare HMO

## 2013-12-22 ENCOUNTER — Encounter (HOSPITAL_COMMUNITY): Payer: Self-pay | Admitting: Physical Therapy

## 2013-12-22 DIAGNOSIS — R29898 Other symptoms and signs involving the musculoskeletal system: Secondary | ICD-10-CM

## 2013-12-22 DIAGNOSIS — R279 Unspecified lack of coordination: Secondary | ICD-10-CM | POA: Diagnosis not present

## 2013-12-22 DIAGNOSIS — M6281 Muscle weakness (generalized): Secondary | ICD-10-CM

## 2013-12-22 DIAGNOSIS — Z5189 Encounter for other specified aftercare: Secondary | ICD-10-CM | POA: Insufficient documentation

## 2013-12-22 DIAGNOSIS — Z9181 History of falling: Secondary | ICD-10-CM

## 2013-12-22 DIAGNOSIS — M545 Low back pain: Secondary | ICD-10-CM | POA: Insufficient documentation

## 2013-12-22 DIAGNOSIS — M5442 Lumbago with sciatica, left side: Secondary | ICD-10-CM

## 2013-12-22 NOTE — Therapy (Signed)
Physical Therapy Treatment  Patient Details  Name: [name] MRN: [id number] Date of Birth: [birth year]  Encounter Date: 12/22/2013      PT End of Session - 12/22/13 1328    Visit Number 9   Number of Visits 16   Date for PT Re-Evaluation 01/07/14   PT Start Time 9924   PT Stop Time 1407   PT Time Calculation (min) 46 min      Past Medical History  Diagnosis Date  . Arthritis   . Hypertension   . PONV (postoperative nausea and vomiting)     severe n/v after every surgery  . Hypothyroidism   . Hypothyroid   . Peripheral vascular disease   . Osteoarthritis   . GERD (gastroesophageal reflux disease)   . Constipation due to pain medication   . COPD (chronic obstructive pulmonary disease)   . Depression     Past Surgical History  Procedure Laterality Date  . Total knee arthroplasty    . Carotid stent    . Shoulder surgery    . Abdominal hysterectomy    . Cholecystectomy    . Back surgery    . Rotator cuff repair    . Eye surgery      cataract /lens both eyes  . Vascular surgery    . Joint replacement  [id number]    knees  . Anterior cervical decomp/discectomy fusion N/A 04/03/2012    Procedure: ANTERIOR CERVICAL DECOMPRESSION/DISCECTOMY FUSION 2 LEVELS;  Surgeon: Hosie Spangle, MD;  Location: [place] [facility];  Service: Neurosurgery;  Laterality: N/A;  Cervical four-five,Cervical five-six anterior cervical decompression with fusion plating and bonegraft  . Cholecystectomy      There were no vitals taken for this visit.  Visit Diagnosis:  Left leg weakness  Risk for falls  Midline low back pain with left-sided sciatica  Muscle weakness (generalized)  Lack of coordination          Adult PT Treatment/Exercise - 12/22/13 1415    Hip Flexor Stretch 3 reps;30 seconds   Hip Flexor Stretch Limitations on second stair   Piriformis Stretch 2 reps;30 seconds;Limitations  seated   Stationary Bike nustep hills 4, L 3 x 10:00 intervals   Functional  Squats Limitations  step arounds;bilaterally x3   Forward Lunge 10 reps   Side Lunge 10 reps;Limitations  4"   Other Standing Lumbar Exercises 3D hip excursion 15x   Other Seated Lumbar Exercises 3D thoracic spine excursion with blue ball 10x with therapist assist for sagittal plane motions.    Bent Knee Raise 10 reps;3 seconds   Bridge 10 reps;3 seconds   Straight Leg Raise 10 reps;3 seconds                Plan - 12/22/13 1402    Clinical Impression Statement Patient was 40mins late to appointment-continued with stretches, supine therex and added squat step arounds to promote IR, patient ended session on NuStep.   PT Plan Focus on improvign lumbar spine stability and strength to improve ability to control spine during forward bending exercises.         Problem List Patient Active Problem List   Diagnosis Date Noted  . Lumbago 11/12/2013  . Stiffness of joint, not elsewhere classified, pelvic region and thigh 11/12/2013  . Muscle weakness (generalized) 11/12/2013  . Lack of coordination 03/21/2013  . Fine motor impairment 03/21/2013  . Left leg weakness 03/17/2013  . Risk for falls 03/17/2013  . Carotid artery disease 03/11/2013  .  Subclavian artery stenosis 03/11/2013  . TIA (transient ischemic attack) 03/11/2013  . Tobacco use disorder 03/11/2013  . CVA (cerebral infarction) 03/05/2013  . Pain in joint, shoulder region 11/01/2010                                            [name], [name] 12/22/2013, 2:16 PM

## 2013-12-24 ENCOUNTER — Telehealth (HOSPITAL_COMMUNITY): Payer: Self-pay | Admitting: Physical Therapy

## 2013-12-24 ENCOUNTER — Ambulatory Visit (HOSPITAL_COMMUNITY): Payer: Medicare HMO | Admitting: Physical Therapy

## 2013-12-24 NOTE — Telephone Encounter (Signed)
She requested that she be Discharged from all future apptments

## 2013-12-29 ENCOUNTER — Ambulatory Visit (HOSPITAL_COMMUNITY): Payer: PRIVATE HEALTH INSURANCE | Admitting: Physical Therapy

## 2013-12-31 ENCOUNTER — Ambulatory Visit (HOSPITAL_COMMUNITY): Payer: PRIVATE HEALTH INSURANCE

## 2014-01-05 ENCOUNTER — Ambulatory Visit (HOSPITAL_COMMUNITY): Payer: PRIVATE HEALTH INSURANCE

## 2014-01-07 ENCOUNTER — Ambulatory Visit (HOSPITAL_COMMUNITY): Payer: PRIVATE HEALTH INSURANCE | Admitting: Physical Therapy

## 2014-01-27 ENCOUNTER — Encounter (HOSPITAL_COMMUNITY): Payer: Self-pay | Admitting: Physical Therapy

## 2014-01-27 NOTE — Therapy (Signed)
[facility] [address] [place], [place], [address] Phone: [phone]   Fax:  [phone]  Patient Details  Name: [name] MRN: [id number] Date of Birth: 19-Feb-1936  Encounter Date: 01/27/2014  PHYSICAL THERAPY DISCHARGE SUMMARY  Visits from Start of Care: 8  Patient self discharged. Left phone message  Current functional level related to goals / functional outcomes: PT Short Term Goals PT Short Term Goal 1: Patient will demosntrate increased hip internal rotation of >20 degrees bilateral to improve deceleration gait mechanics PT Short Term Goal 1 - Progress: Met PT Short Term Goal 2: Patient will demosntrate a negative Ely's test with knee at 120 degrees to improve patient's ability to squat to chair PT Short Term Goal 2 - Progress: Progressing toward goal PT Short Term Goal 3: Patient will demonstrate increase trunk flexion strength of 4-/5 MMT indicating increase trunk stability.  PT Short Term Goal 3 - Progress: Met PT Short Term Goal 4: Patient will demosntrate a negative 90/90 test on Lt LE to improve ability to touch toes without pain.  PT Short Term Goal 4 - Progress: Partly met PT Short Term Goal 5: patient will dmeonstrate full lumbar extension ROM PT Short Term Goal 5 - Progress: Progressing toward goal PT Long Term Goals PT Long Term Goal 1: Patient will dmeosntrate increased trunk flexion of 4/5MMT indicatign increased stability .  PT Long Term Goal 1 - Progress: Progressing toward goal PT Long Term Goal 2: Patient will demosntrate increased hip internal rotation of >25 degrees bilateral to improve deceleration gait mechanics PT Long Term Goal 2 - Progress: Met Long Term Goal 3: patient will demonstrate increased glut max/med strength of 4/5 MMT indicating increased ability to perform sit to stand withtou UE use Long Term Goal 3 Progress: Progressing toward goal Long Term Goal 4: Patient will be able to lift >10lb from the  floor without pain to be able to perform normal home chores.  Long Term Goal 4 Progress: Partly met   Plan: Patient agrees to discharge.  Patient goals were partially met. Patient is being discharged due to meeting the stated rehab goals.  ?????        Devona Konig PT DPT [phone]

## 2014-04-29 DIAGNOSIS — K210 Gastro-esophageal reflux disease with esophagitis: Secondary | ICD-10-CM | POA: Diagnosis not present

## 2014-04-29 DIAGNOSIS — I639 Cerebral infarction, unspecified: Secondary | ICD-10-CM | POA: Diagnosis not present

## 2014-04-29 DIAGNOSIS — I10 Essential (primary) hypertension: Secondary | ICD-10-CM | POA: Diagnosis not present

## 2014-04-29 DIAGNOSIS — J449 Chronic obstructive pulmonary disease, unspecified: Secondary | ICD-10-CM | POA: Diagnosis not present

## 2014-05-13 ENCOUNTER — Encounter (INDEPENDENT_AMBULATORY_CARE_PROVIDER_SITE_OTHER): Payer: Self-pay | Admitting: *Deleted

## 2014-06-09 ENCOUNTER — Encounter (INDEPENDENT_AMBULATORY_CARE_PROVIDER_SITE_OTHER): Payer: Self-pay | Admitting: Internal Medicine

## 2014-06-09 ENCOUNTER — Telehealth (INDEPENDENT_AMBULATORY_CARE_PROVIDER_SITE_OTHER): Payer: Self-pay | Admitting: *Deleted

## 2014-06-09 ENCOUNTER — Other Ambulatory Visit (INDEPENDENT_AMBULATORY_CARE_PROVIDER_SITE_OTHER): Payer: Self-pay | Admitting: *Deleted

## 2014-06-09 ENCOUNTER — Ambulatory Visit (INDEPENDENT_AMBULATORY_CARE_PROVIDER_SITE_OTHER): Payer: Commercial Managed Care - HMO | Admitting: Internal Medicine

## 2014-06-09 VITALS — BP 128/50 | HR 65 | Temp 97.6°F | Ht 61.5 in | Wt 131.7 lb

## 2014-06-09 DIAGNOSIS — Z1211 Encounter for screening for malignant neoplasm of colon: Secondary | ICD-10-CM

## 2014-06-09 DIAGNOSIS — K219 Gastro-esophageal reflux disease without esophagitis: Secondary | ICD-10-CM

## 2014-06-09 MED ORDER — PEG 3350-KCL-NA BICARB-NACL 420 G PO SOLR: 4000.0000 mL | Freq: Once | ORAL | Status: DC

## 2014-06-09 NOTE — Patient Instructions (Signed)
EGD, Colonoscopy. The risks and benefits such as perforation, bleeding, and infection were reviewed with the patient and is agreeable.

## 2014-06-09 NOTE — Progress Notes (Signed)
Subjective:    Patient ID: [name], female    DOB: 05/28/1935, 79 y.o.   MRN: [id number]  HPI  79 yr old white female referred by Dr. Luan Pulling for GERD. She tells me she felt bile coming up into her espohagus. Symptoms occurred in the morning.  Symptoms all of a sudden occurred.  There was some nausea associated with her symptoms. Symptoms did not occur every day. Reflux was sporadic. She was started on Protonix 2 weeks and has been symptoms free since She has had some nausea. Appetite is good. No weight loss. She usually has a BM daily. No melena or BRRB.  She says sometimes she feels a bubbling sensation at times in her left abdomen. Her last colonoscopy was 12 yrs ago by Dr Laural Golden and was normal (per patient)     Review of Systems  Widowed. 3 children in good health. Past Medical History  Diagnosis Date  . Arthritis   . Hypertension   . PONV (postoperative nausea and vomiting)     severe n/v after every surgery  . Hypothyroidism   . Hypothyroid   . Peripheral vascular disease   . Osteoarthritis   . GERD (gastroesophageal reflux disease)   . Constipation due to pain medication   . COPD (chronic obstructive pulmonary disease)   . Depression     Past Surgical History  Procedure Laterality Date  . Total knee arthroplasty    . Carotid stent    . Shoulder surgery    . Abdominal hysterectomy    . Cholecystectomy    . Back surgery    . Rotator cuff repair    . Eye surgery      cataract /lens both eyes  . Vascular surgery    . Joint replacement  [id number]    knees  . Anterior cervical decomp/discectomy fusion N/A 04/03/2012    Procedure: ANTERIOR CERVICAL DECOMPRESSION/DISCECTOMY FUSION 2 LEVELS;  Surgeon: Hosie Spangle, MD;  Location: [place] [facility];  Service: Neurosurgery;  Laterality: N/A;  Cervical four-five,Cervical five-six anterior cervical decompression with fusion plating and bonegraft  . Cholecystectomy      Allergies  Allergen Reactions  . Aleve  [Naproxen Sodium] Hives and Palpitations  . Celebrex [Celecoxib] Nausea And Vomiting  . Codeine Nausea And Vomiting  . Morphine And Related Nausea And Vomiting    Current Outpatient Prescriptions on File Prior to Visit  Medication Sig Dispense Refill  . Ascorbic Acid (VITAMIN C) 1000 MG tablet Take 1,000 mg by mouth daily.     Marland Kitchen aspirin EC 81 MG tablet Take 81 mg by mouth once a week.     . Biotin 1000 MCG tablet Take 500 mcg by mouth daily.     Marland Kitchen CALCIUM-VITAMIN D PO Take 2 capsules by mouth daily. Calcium 1200mg  + Vitamin D (unknown strength)    . diltiazem (CARDIZEM CD) 120 MG 24 hr capsule Take 1 capsule (120 mg total) by mouth every evening. 30 capsule 1  . DULoxetine (CYMBALTA) 60 MG capsule Take 60 mg by mouth daily.    . Flaxseed Oil OIL Take 1 capsule by mouth daily.    . Flaxseed, Linseed, (FLAX SEED OIL) 1000 MG CAPS Take 1 capsule by mouth daily.     Marland Kitchen levothyroxine (SYNTHROID, LEVOTHROID) 88 MCG tablet Take 1 tablet (88 mcg total) by mouth daily. 30 tablet 1  . Multiple Vitamin (MULTIVITAMIN WITH MINERALS) TABS Take 1 tablet by mouth daily.    . niacin 500 MG tablet  Take 500 mg by mouth at bedtime.    . Omega-3 Fatty Acids (FISH OIL) 1200 MG CAPS Take 1,200 mg by mouth daily.     No current facility-administered medications on file prior to visit.         Objective:   Physical Exam Blood pressure 128/50, pulse 65, temperature 97.6 F (36.4 C), height 5' 1.5" (1.562 m), weight 131 lb 11.2 oz (59.739 kg).  Alert and oriented. Skin warm and dry. Oral mucosa is moist.   . Sclera anicteric, conjunctivae is pink. Thyroid not enlarged. No cervical lymphadenopathy. Lungs clear. Heart regular rate and rhythm.  Abdomen is soft. Bowel sounds are positive. No hepatomegaly. No abdominal masses felt. No tenderness.  No edema to lower extremities.         Assessment & Plan:  GERD. Better since starting the Protonix. PUD needs to be ruled out. Screening colonoscopy.

## 2014-06-09 NOTE — Telephone Encounter (Signed)
Patient needs trilyte 

## 2014-06-13 NOTE — Consult Note (Signed)
Referring Physician:  Algernon Huxley   Primary Care Physician:  [facility] [address] [place], [place] [address], [phone]  Reason for Consult: Admit Date: 27-Feb-2013  Chief Complaint: stroke  Reason for Consult: CVA   History of Present Illness: History of Present Illness:   79 yo RHD F presents to [facility] for an elective angiogram for subclavian steal.  After the procedure, pt was noted to have some L sided weakness which persisted.  There is question of aphasia by primary.  Pt has never had anything like this before and is very emotional about this.  She denies headache, dizziness or sensory changes.  There is mild N/V.  ROS:  General weakness   HEENT no complaints   Lungs no complaints   Cardiac no complaints   GI nausea/vomiting   GU no complaints   Musculoskeletal no complaints   Extremities no complaints   Skin no complaints   Neuro no complaints   Endocrine no complaints   Psych depression   Past Medical/Surgical Hx:  Peripheral Vascular Disease:   Hypertension:   Hypothyroidism:   Cholecystectomy:   Cataract Extraction: bilateral  Hysterectomy - Total:   back and neck surgeries:   left and right sholder surgeries:   Carotid Endarterectomy:   Past Medical/ Surgical Hx:  Past Medical History as above   Past Surgical History as above   Home Medications: Medication Instructions Last Modified Date/Time  Cymbalta 40 milligram(s) orally once a day 08-Jan-15 16:24  diltiazem 120 mg oral tablet 1  orally once a day 08-Jan-15 16:24  Synthroid 88 mcg (0.088 mg) oral tablet 1 tab(s) orally once a day 08-Jan-15 16:24  niacin 500 mg oral capsule 1  orally once a day 08-Jan-15 16:24  aspirin 81 mg oral tablet 1 tab(s) orally once a day 08-Jan-15 16:24  Calcium 600+D 600 mg-200 units oral tablet 2 cap(s) orally once a day 08-Jan-15 16:24  Fish Oil 1200 mg oral capsule 1 cap(s) orally once a day 08-Jan-15 16:24   Vitamin C 1000 mg oral tablet 1 tab(s) orally once a day 08-Jan-15 16:24  Flax Seed Oil - oral capsule  orally ,  08-Jan-15 16:24  Multi vitamin 1   once a day 08-Jan-15 16:24  biotin 1000 mcg oral tablet 1 tab(s) orally once a day 08-Jan-15 16:24   Allergies:  Aleve: Tachycardia, Rash  Morphine: N/V/Diarrhea  Codeine: N/V/Diarrhea  Celebrex: N/V/Diarrhea  Allergies:  Allergies as above   Social/Family History: Employment Status: retired  Lives With: alone  Living Arrangements: house  Social History: no tob, no EtOH, no illicits  Family History: no stroke but + CAD   Vital Signs: **Vital Signs.:   09-Jan-15 12:59  Vital Signs Type Routine  Temperature Temperature (F) 97.2  Celsius 36.2  Temperature Source oral  Pulse Pulse 71  Respirations Respirations 18  Systolic BP Systolic BP 193  Diastolic BP (mmHg) Diastolic BP (mmHg) 52  Mean BP 75  Pulse Ox % Pulse Ox % 90  Pulse Ox Activity Level  At rest  Oxygen Delivery Room Air/ 21 %   Physical Exam: General: thin, NAD, sad appearing  HEENT: normocephalic, sclera nonicteric, oropharynx clear  Neck: supple, no JVD, no bruits  Chest: CTA B, no wheezing, decent movement  Cardiac: RRR, no murmurs, no edema, 2+ pulses  Extremities: no C/C/E, FROM   Neurologic Exam: Mental Status: alert and oriented x 3, normal language, mild dysarthria, follows complex commands,  very sad  Cranial Nerves: PERRLA, EOMI, nl VF, face symmetric, tongue midline, shoulder shrug equal  Motor Exam: 5-/5 R, 1-2/5 L but poor effort, nl tone  Deep Tendon Reflexes: 1+/4 B, mute plantars B  Sensory Exam: decreased temp on L leg only  Coordination: FTN and HTS WNL on R, untestable on L   Lab Results: Thyroid:  09-Jan-15 03:14   Thyroid Stimulating Hormone  0.381 (0.45-4.50 (International Unit)  ----------------------- Pregnant patients have  different reference  ranges for TSH:  - - - - - - - - - -  Pregnant, first trimetser:  0.36 - 2.50  uIU/mL)  Routine Chem:  09-Jan-15 03:14   Cholesterol, Serum 169  Triglycerides, Serum 56  HDL (INHOUSE) 57  VLDL Cholesterol Calculated 11  LDL Cholesterol Calculated  101 (Result(s) reported on 28 Feb 2013 at 03:55AM.)  Glucose, Serum 92  BUN 11  Creatinine (comp) 0.65  Sodium, Serum 136  Potassium, Serum 3.9  Chloride, Serum 105  CO2, Serum 27  Calcium (Total), Serum  7.6  Anion Gap  4  Osmolality (calc) 271  eGFR (African American) >60  eGFR (Non-African American) >60 (eGFR values <6m/min/1.73 m2 may be an indication of chronic kidney disease (CKD). Calculated eGFR is useful in patients with stable renal function. The eGFR calculation will not be reliable in acutely ill patients when serum creatinine is changing rapidly. It is not useful in  patients on dialysis. The eGFR calculation may not be applicable to patients at the low and high extremes of body sizes, pregnant women, and vegetarians.)  Routine Hem:  09-Jan-15 03:14   WBC (CBC) 5.7  RBC (CBC) 4.05  Hemoglobin (CBC) 13.4  Hematocrit (CBC) 38.8  Platelet Count (CBC) 248  MCV 96  MCH 33.1  MCHC 34.5  RDW 13.6  Neutrophil % 67.8  Lymphocyte % 23.3  Monocyte % 8.1  Eosinophil % 0.5  Basophil % 0.3  Neutrophil # 3.9  Lymphocyte # 1.3  Monocyte # 0.5  Eosinophil # 0.0  Basophil # 0.0 (Result(s) reported on 28 Feb 2013 at 03:57AM.)   Radiology Results: CT:    08-Jan-15 15:44, CT Head Without Contrast  CT Head Without Contrast   REASON FOR EXAM:    r/o bleed  COMMENTS:       PROCEDURE: CT  - CT HEAD WITHOUT CONTRAST  - Feb 27 2013  3:44PM     CLINICAL DATA:  Rule out bleed.  TIA symptoms.    EXAM:  CT HEAD WITHOUT CONTRAST    TECHNIQUE:  Contiguous axial images were obtainedfrom the base of the skull  through the vertex without intravenous contrast.    COMPARISON:  10/21/2010  FINDINGS:  Sinuses/Soft tissues: Surgical changes about the left globe. Clear  paranasal sinuses and mastoid air  cells.    Intracranial: Cerebralvolume which is normal for age. Moderate low  density in the periventricular white matter likely related to small  vessel disease. This is similar to the prior exam.    No mass lesion, hemorrhage, hydrocephalus, acute infarct,  intra-axial, or extra-axial fluid collection.     IMPRESSION:  1.  No acute intracranial abnormality.  2. Moderate small vessel ischemic change.  Electronically Signed    By: KAbigail MiyamotoM.D.    On: 02/27/2013 15:50         Verified By: KAreta Haber M.D.,   Radiology Impression: Radiology Impression: CT of head personally reviewed by me and shows diffuse white matter changes that are  more prominent on R   Impression/Recommendations: Recommendations:   labs reviewed by me notes reviewed by me   R hemispheric infarct-  this could be either lacunar from elevated BP or small embolic infarct from dislodged plaque; treatment would be essentially the same Hx of carotid stenosis-  not sure which side Depression-  this is clearly worse now after stroke, there is no apparent aphasia but just no desire to participate needs CTA of head and neck to r/o repairable carotid stenosis on R that can be repaired agree with ASA, plavix and statin MRI would be helpful but not necessary place on stroke protocol and still needs w/u with echo and appropiate labs do not treat BP unless > 220/120 for the next 2 days then gradually lower to < 160/90 need PT/OT and probably rehab no need for anticoagulation noted will sign off, please have pt f/u with Neurology in 2-3 months  Electronic Signatures: Jamison Neighbor (MD)  (Signed 09-Jan-15 13:25)  Authored: REFERRING PHYSICIAN, Primary Care Physician, Consult, History of Present Illness, Review of Systems, PAST MEDICAL/SURGICAL HISTORY, HOME MEDICATIONS, ALLERGIES, Social/Family History, NURSING VITAL SIGNS, Physical Exam-, LAB RESULTS, RADIOLOGY RESULTS, Recommendations   Last Updated: 09-Jan-15 13:25  by Jamison Neighbor (MD)

## 2014-06-13 NOTE — Discharge Summary (Signed)
PATIENT NAME:  [name], [name] MR#:  [id number] DATE OF BIRTH:  04-Feb-1936  DATE OF ADMISSION:  02/27/2013 DATE OF DISCHARGE:  03/05/2013  DISCHARGE DIAGNOSIS:  1.  Symptomatic bilateral subclavian artery stenosis.  2.  Stroke.  3.  History of carotid artery disease.  4.  Hypertension.   PROCEDURE PERFORMED WHILE IN HOSPITAL:  Right subclavian artery angioplasty   BRIEF HISTORY: This is a 79 year old white female with a long history of vascular disease. She had a carotid endarterectomy about eight years ago. She has lower extremity peripheral vascular disease, as well as bilateral subclavian artery lesions. Her arm pain and weakness with activity had progressed to the point where she was highly symptomatic. We initially performed only observation and exercise regimen for her upper extremities; however, her symptoms progressed to the point where she now desired an intervention to revascularize her subclavian artery in hopes of improving her right upper extremity function and discomfort. She is brought into the hospital for angiogram and possible revascularization.   HOSPITAL COURSE: The patient was admitted to same-day surgery, taken to the vascular suite where a right subclavian artery angioplasty was performed. Postoperatively, she began to show signs of confusion and aphasia and developed left upper extremity and left lower extremity weakness consistent with a right hemispheric stroke. She was started on Aggrastat. She had been  heparinized with the procedure and given a bolus of Plavix. Her symptoms improved but did not resolve. Her difficulty with speech over the next several days normalized. She had good grip in her left hand but had proximal muscle weakness in the left arm. Her ambulation with therapy and with a walker had significantly improved. She was stable for discharge to a rehab bed at [facility], where she will receive acute inpatient rehab for her stroke. Her diet will be regular. Her  activity will be as tolerated.   DISCHARGE MEDICATIONS:  Include Cymbalta 40 mg daily, diltiazem 120 mg daily, Synthroid 88 mcg daily, niacin 500 mg daily, aspirin 81 mg daily, calcium and vitamin D 2 caps daily, fish oil 1200 mg daily, vitamin C is 1000 mg daily, flaxseed oil daily, multivitamin daily, biotin 1000 mcg daily, Plavix 75 mg daily, pravastatin 20 mg daily and Percocet as needed for discomfort.   She will return to our office in 2 to 3 weeks with right upper extremity arterial duplex.       ____________________________ Algernon Huxley, MD jsd:dmm D: 03/05/2013 11:26:32 ET T: 03/05/2013 11:48:08 ET JOB#: [id number]  cc: Algernon Huxley, MD, <Dictator> Algernon Huxley MD ELECTRONICALLY SIGNED 03/10/2013 11:48

## 2014-06-13 NOTE — Op Note (Signed)
PATIENT NAME:  [name], [name] MR#:  [id number] DATE OF BIRTH:  1935-04-18  DATE OF PROCEDURE:  02/27/2013  PREOPERATIVE DIAGNOSES: 1.  Bilateral symptomatic subclavian artery stenoses.  2.  Carotid artery stenosis.  3.  Hypertension.   POSTOPERATIVE DIAGNOSES: 1.  Bilateral symptomatic subclavian artery stenoses.  2.  Carotid artery stenosis.  3.  Hypertension.   PROCEDURES PERFORMED: 1.  Ultrasound guidance for vascular access, right femoral artery.  2.  Catheter placement into right subclavian artery from right femoral approach.  3.  Thoracic aortogram and right upper extremity angiogram.  4.  Percutaneous transluminal angioplasty of right subclavian artery with 6 and 7 mm diameter angioplasty balloons.  5.  Pressure measurements of right subclavian artery and right innominate artery.   SURGEON: Algernon Huxley, M.D.   ANESTHESIA: Local with moderate conscious sedation.   ESTIMATED BLOOD LOSS: Approximately 25 mL.   CONTRAST USED: 45 mL of Visipaque.   INDICATION FOR PROCEDURE: This is a 79 year old white female who I had previously performed a carotid endarterectomy on. She has subclavian artery stenosis. This has become more symptomatic on followup, and she now complains of severe pain and weakness in her upper extremities with activity; the right being worse than the left. At this point, we discussed options for treatment of her subclavian artery disease. Given the fact that it is now more symptomatic in nature, she was interested in revascularization. I discussed endovascular options, and she desired to proceed with angiography and possible treatment. Risks and benefits were discussed and informed consent was obtained.   DESCRIPTION OF PROCEDURE: The patient is brought to the vascular and interventional radiology suite. The groins were sterilely prepped and draped and a sterile surgical field was created. The right femoral artery was visualized with ultrasound and found to be widely  patent. It was then accessed under direct ultrasound guidance without difficulty with a Seldinger needle. A J-wire and 5-French sheath were placed. Pigtail catheter was placed into the ascending aorta and an LAO projection thoracic aortogram was performed. This demonstrated what appeared to be about a 60% stenosis of the ostium of the left common carotid artery. Left subclavian artery appeared to have stenosis in the prevertebral location that was moderate in nature, although it was difficult to delineate on a thoracic aortic run. As the right upper extremity was the more symptomatic of the arms that is the arm we are intending to treat today. I then used a JB2 catheter and selectively cannulated the innominate artery and was able to advance the catheter into the innominate. I then switched to an RAO projection angiogram to show this innominate bifurcation better. This did splay it open, and we were able to evaluate the subclavian artery. There appeared to be a moderate degree of stenosis, that by angiography, I would estimate it in the 65% to 70% range, and this was a tapered lesion over about a 3 to 4 cm span, both just before and just after the takeoff of the vertebral artery. I crossed the lesion without difficulty with a stiff angled Glidewire and exchanged for an Advantage wire. I then put a 55 cm long Raabe up that went into the innominate artery origin. The patient was systemically heparinized with 4000 units of intravenous heparin. The lesion was then treated initially with a 6 mm diameter angioplasty balloon. There still appeared to be stenosis, although by angiography this did not appear to be high grade at this point. I decided to perform pressure measurements to delineate the  residual degree of stenosis to see if any further treatment would be necessary. Pressure measurements at this point showed a 20 mmHg gradient from the innominate artery through the sheath to the subclavian artery distal to the  stenosis after exchanging for a straight glide catheter. With the residual gradient, I replaced the wire and treated the lesion with a larger angioplasty balloon, trying to avoid stent placement crossing the vertebral artery. I up sized to a 7 mm diameter angioplasty balloon. This was inflated to 10 atmospheres. A waist was taken, which resolved with angioplasty. Following this, I replaced the diagnostic catheter in the subclavian artery distal to the lesion and performed pressure gradients once more. At this point, there was only an 8 to 9 mmHg pressure gradient, and this was much less significant than on previous readings. Imaging was performed which showed no significant residual stenosis of greater than 30% to 40% that I could detect, and at this point, I elected to terminate the procedure. The diagnostic catheter was removed. The sheath was pulled back to the ipsilateral external iliac artery and an oblique arteriogram was performed. StarClose closure device was then deployed in the usual fashion with excellent hemostatic result. The patient tolerated the procedure well and was taken to the recovery room in stable condition.      ____________________________ Algernon Huxley, MD jsd:dmm D: 02/27/2013 12:50:41 ET T: 02/27/2013 13:07:07 ET JOB#: [id number]  cc: Algernon Huxley, MD, <Dictator> Algernon Huxley MD ELECTRONICALLY SIGNED 03/10/2013 11:49

## 2014-06-13 NOTE — Consult Note (Signed)
PATIENT NAME:  [name], [name] MR#:  [id number] DATE OF BIRTH:  02-28-1935  DATE OF CONSULTATION:  02/27/2013  REFERRING PHYSICIAN:  Dr. Lucky Cowboy CONSULTING PHYSICIAN:  Chari Parmenter H. Posey Pronto, MD  PRIMARY CARE PROVIDER: Nonlocal.   REASON FOR CONSULTATION: Opinion regarding the patient's waxing and waning neurological symptoms, hypertension management, hypothyroidism.   HISTORY OF PRESENT ILLNESS: The patient is a 79 year old white female who earlier underwent surgery for symptomatic bilateral subclavian artery stenosis with thoracic aortogram, right upper extremity angiogram, PTA of the right subclavian artery who postoperatively started having waxing and waning, symptoms of expressive aphasia, also intermittent left upper extremity weakness. She was given a dose of Plavix 150 mg and started on Aggrastat. Currently, her symptoms have improved. She has currently just returned from PACU and is very tired and is not answering all the questions, but she states that she has chronic pain in her shoulders due to chronic bilateral shoulder problems. She otherwise denies any chest pain or shortness of breath or palpitations.   PAST MEDICAL HISTORY: Significant for hypertension, hypothyroidism, subclavian artery stenosis, peripheral vascular disease.   PAST SURGICAL HISTORY: Status post cholecystectomy, cataract surgery, hysterectomy, back and neck surgery, left and right shoulder surgery, cataract extraction, carotid endarterectomy.   ALLERGIES: MORPHINE, CODEINE, CELEBREX AND ALEVE.   CURRENT MEDICATIONS: She is on vitamin C 1000 mg daily, Synthroid 88 mcg daily, niacin 500 daily, multivitamin 1 tab p.o. daily, flex seed oil daily, fish oil 1200 mg daily, diltiazem 120 daily, Cymbalta 40 mg once a day, calcium plus vitamin D 2 caps daily, biotin 10,000 mcg daily, aspirin 81 mg 1 tab p.o. daily.   SOCIAL HISTORY: History of smoking. No alcohol or drug use.   FAMILY HISTORY: Positive for hypertension.  REVIEW  OF SYSTEMS:  Limited due to patient being tired and not wanting to answer any questions.  CONSTITUTIONAL: Denies any fevers. Has upper extremity weakness. No pain. No weight loss. No weight gain.  EYES: No blurred or double vision. No pain. No redness. No inflammation. Has a history of cataract.  ENT: No tinnitus. No ear pain. No hearing loss. No seasonal or year-round allergies.  RESPIRATORY: Denies any cough, wheezing. No hemoptysis. No COPD.  CARDIOVASCULAR: Denies any chest pain, orthopnea, edema or arrhythmia.  GASTROINTESTINAL: No nausea, vomiting, diarrhea. No abdominal pain. No hematemesis. No melena.  GENITOURINARY: Denies any dysuria, hematuria, renal calc or frequency.  ENDOCRINE: Denies any polyuria, nocturia. Does have hypothyroidism. HEMATOLOGIC AND LYMPHATIC: Denies anemia, easy bruisability or bleeding.  SKIN: No acne. No rash. No changes in mole, hair or skin.  MUSCULOSKELETAL: Has chronic pain in her back, shoulders. NEUROLOGIC:  Complains of expressive aphasia as described above. Left-sided intermittent upper extremity weakness.  PSYCHIATRIC: No anxiety. No insomnia. No ADD.   PHYSICAL EXAMINATION: VITAL SIGNS: Temperature 97.9, pulse 80, respirations 18, blood pressure 179/70, O2 96% on 2 liters.  GENERAL: The patient is a thin Caucasian female in no acute distress.  HEENT: Head atraumatic, normocephalic. Pupils equally round, reactive to light and accommodation. There is no conjunctival pallor. There is no scleral icterus. Extraocular movements intact. Nasal exam shows no drainage or ulceration.  Oropharynx is clear without any exudate.  NECK: Supple without any JVD.  CARDIOVASCULAR: Regular rate and rhythm. No murmurs, rubs, clicks or gallops. PMI is not displaced.  LUNGS: Clear to auscultation bilaterally, without any rales, rhonchi, wheezing.  ABDOMEN: Soft, nontender, nondistended. Positive bowel sounds x 4.  EXTREMITIES: No clubbing, cyanosis or edema.  SKIN: No  rash.  LYMPHATICS: No lymph nodes palpable.  VASCULAR: Good DP, PT pulses.  PSYCHIATRIC: Not anxious or depressed.  NEUROLOGIC: Awake, alert, oriented. Cranial nerves II through XII grossly appear intact. There is some asymmetrical weakness with left arm being more weak than the right. The lower extremity left leg is more weaker than the right but I would say the strength is 5 to 6 out of 5. Reflexes 2+, Babinski's downgoing.   LABORATORY AND RADIOLOGICAL DATA: She had a CT scan of the head that was negative for any acute abnormality, moderate small vessel ischemic changes were noted.   ASSESSMENT AND PLAN: The patient is a 79 year old white female who underwent surgery for symptomatic subclavian artery stenosis operative having intermittent expressive aphasia and left-sided weakness.  1.  Intermittent expressive aphasia and left-sided weakness, likely due to emboli from recent surgery. Has been given Plavix and on tirofiban, which we will continue. We will give her a dose of pravastatin. Obtain a MRI of the brain in the morning. Carotid Dopplers are already done. If symptoms persist, recommend neurology evaluation. Continue aspirin for time being. I am not sure if she needs both aspirin and Plavix at this point. We will also get PT evaluation and speech evaluation in the morning.  2.  Hypertension. We will allow permissive hypertension in light of her cerebrovascular accident/transient ischemic attack. We will only treat if blood pressure is excessively elevated.  3.  Hyperlipidemia. Continue home treatment with niacin. I will add pravastatin to her current regimen.  4.  Hypothyroidism. We will continue Synthroid. We will check a TSH in the morning.  5.  Miscellaneous: The patient already on tirofiban for  deep venous thrombosis prophylaxis, which we will continue.  TIME SPENT ON THIS PATIENT:  55 minutes     ____________________________ Chana Bode H. Posey Pronto, MD shp:ce D: 02/27/2013 18:51:36  ET T: 02/27/2013 19:14:40 ET JOB#: [id number]  cc: Jermale Crass H. Posey Pronto, MD, <Dictator> Alric Seton MD ELECTRONICALLY SIGNED 03/14/2013 17:09

## 2014-06-15 DIAGNOSIS — K2950 Unspecified chronic gastritis without bleeding: Secondary | ICD-10-CM | POA: Diagnosis not present

## 2014-06-15 DIAGNOSIS — D120 Benign neoplasm of cecum: Secondary | ICD-10-CM | POA: Diagnosis not present

## 2014-06-22 DIAGNOSIS — N390 Urinary tract infection, site not specified: Secondary | ICD-10-CM | POA: Diagnosis not present

## 2014-07-15 ENCOUNTER — Ambulatory Visit (HOSPITAL_COMMUNITY)
Admission: RE | Admit: 2014-07-15 | Discharge: 2014-07-15 | Disposition: A | Discharge status: Home / Self Care | Payer: Commercial Managed Care - HMO | Source: Ambulatory Visit | Attending: Internal Medicine | Admitting: Internal Medicine

## 2014-07-15 ENCOUNTER — Encounter (HOSPITAL_COMMUNITY)
Admission: RE | Disposition: A | Discharge status: Home / Self Care | Payer: Self-pay | Source: Ambulatory Visit | Attending: Internal Medicine

## 2014-07-15 ENCOUNTER — Encounter (HOSPITAL_COMMUNITY): Payer: Self-pay | Admitting: *Deleted

## 2014-07-15 DIAGNOSIS — K219 Gastro-esophageal reflux disease without esophagitis: Secondary | ICD-10-CM | POA: Diagnosis not present

## 2014-07-15 DIAGNOSIS — Z7982 Long term (current) use of aspirin: Secondary | ICD-10-CM | POA: Insufficient documentation

## 2014-07-15 DIAGNOSIS — Z96653 Presence of artificial knee joint, bilateral: Secondary | ICD-10-CM | POA: Diagnosis not present

## 2014-07-15 DIAGNOSIS — Z9049 Acquired absence of other specified parts of digestive tract: Secondary | ICD-10-CM | POA: Insufficient documentation

## 2014-07-15 DIAGNOSIS — K5520 Angiodysplasia of colon without hemorrhage: Secondary | ICD-10-CM | POA: Insufficient documentation

## 2014-07-15 DIAGNOSIS — F329 Major depressive disorder, single episode, unspecified: Secondary | ICD-10-CM | POA: Insufficient documentation

## 2014-07-15 DIAGNOSIS — Z79899 Other long term (current) drug therapy: Secondary | ICD-10-CM | POA: Insufficient documentation

## 2014-07-15 DIAGNOSIS — I10 Essential (primary) hypertension: Secondary | ICD-10-CM | POA: Insufficient documentation

## 2014-07-15 DIAGNOSIS — R1013 Epigastric pain: Secondary | ICD-10-CM | POA: Diagnosis present

## 2014-07-15 DIAGNOSIS — J449 Chronic obstructive pulmonary disease, unspecified: Secondary | ICD-10-CM | POA: Diagnosis not present

## 2014-07-15 DIAGNOSIS — D120 Benign neoplasm of cecum: Secondary | ICD-10-CM | POA: Insufficient documentation

## 2014-07-15 DIAGNOSIS — Z1211 Encounter for screening for malignant neoplasm of colon: Secondary | ICD-10-CM | POA: Insufficient documentation

## 2014-07-15 DIAGNOSIS — E039 Hypothyroidism, unspecified: Secondary | ICD-10-CM | POA: Insufficient documentation

## 2014-07-15 DIAGNOSIS — M1990 Unspecified osteoarthritis, unspecified site: Secondary | ICD-10-CM | POA: Diagnosis not present

## 2014-07-15 DIAGNOSIS — F17210 Nicotine dependence, cigarettes, uncomplicated: Secondary | ICD-10-CM | POA: Insufficient documentation

## 2014-07-15 DIAGNOSIS — K5730 Diverticulosis of large intestine without perforation or abscess without bleeding: Secondary | ICD-10-CM | POA: Insufficient documentation

## 2014-07-15 DIAGNOSIS — I739 Peripheral vascular disease, unspecified: Secondary | ICD-10-CM | POA: Insufficient documentation

## 2014-07-15 DIAGNOSIS — K3189 Other diseases of stomach and duodenum: Secondary | ICD-10-CM | POA: Insufficient documentation

## 2014-07-15 DIAGNOSIS — K2950 Unspecified chronic gastritis without bleeding: Secondary | ICD-10-CM | POA: Insufficient documentation

## 2014-07-15 DIAGNOSIS — R112 Nausea with vomiting, unspecified: Secondary | ICD-10-CM | POA: Diagnosis present

## 2014-07-15 HISTORY — PX: ESOPHAGOGASTRODUODENOSCOPY: SHX5428

## 2014-07-15 HISTORY — PX: COLONOSCOPY: SHX5424

## 2014-07-15 SURGERY — COLONOSCOPY
Anesthesia: Moderate Sedation

## 2014-07-15 MED ORDER — STERILE WATER FOR IRRIGATION IR SOLN
Status: DC | PRN
  Administered 2014-07-15: 14:00:00

## 2014-07-15 MED ORDER — MIDAZOLAM HCL 5 MG/5ML IJ SOLN
INTRAMUSCULAR | Status: AC
  Filled 2014-07-15: qty 10

## 2014-07-15 MED ORDER — BUTAMBEN-TETRACAINE-BENZOCAINE 2-2-14 % EX AERO
INHALATION_SPRAY | CUTANEOUS | Status: DC | PRN
  Administered 2014-07-15: 2 via TOPICAL

## 2014-07-15 MED ORDER — MEPERIDINE HCL 50 MG/ML IJ SOLN
INTRAMUSCULAR | Status: AC
  Filled 2014-07-15: qty 1

## 2014-07-15 MED ORDER — MEPERIDINE HCL 50 MG/ML IJ SOLN
INTRAMUSCULAR | Status: DC | PRN
  Administered 2014-07-15: 10 mg via INTRAVENOUS
  Administered 2014-07-15: 25 mg via INTRAVENOUS
  Administered 2014-07-15: 15 mg via INTRAVENOUS

## 2014-07-15 MED ORDER — MIDAZOLAM HCL 5 MG/5ML IJ SOLN
INTRAMUSCULAR | Status: DC | PRN
  Administered 2014-07-15: 2 mg via INTRAVENOUS
  Administered 2014-07-15 (×2): 1 mg via INTRAVENOUS

## 2014-07-15 MED ORDER — SODIUM CHLORIDE 0.9 % IV SOLN
INTRAVENOUS | Status: DC
  Administered 2014-07-15: 1000 mL via INTRAVENOUS

## 2014-07-15 NOTE — Op Note (Signed)
EGD PROCEDURE REPORT  PATIENT:  [name]  MR#:  [id number] Birthdate:  Apr 13, 1935, 79 y.o., female Endoscopist:  Dr. Rogene Houston, MD Referred By:  Dr. Alonza Bogus, MD  Procedure Date: 07/15/2014  Procedure:   EGD & Colonoscopy  Indications:  Patient is 61 old Caucasian female who presents with 6 week history of intermittent epigastric pain and postprandial nausea and vomiting. She is doing better with pantoprazole. She denies hematemesis melena or rectal bleeding. She is also undergoing average risk screening colonoscopy. Last exam was 12 years ago.           Informed Consent:  The risks, benefits, alternatives & imponderables which include, but are not limited to, bleeding, infection, perforation, drug reaction and potential missed lesion have been reviewed.  The potential for biopsy, lesion removal, esophageal dilation, etc. have also been discussed.  Questions have been answered.  All parties agreeable.  Please see history & physical in medical record for more information.  Medications:  Demerol 50 mg IV Versed 4 mg IV Cetacaine spray topically for oropharyngeal anesthesia  EGD  Description of procedure:  The endoscope was introduced through the mouth and advanced to the second portion of the duodenum without difficulty or limitations. The mucosal surfaces were surveyed very carefully during advancement of the scope and upon withdrawal.  Findings:  Esophagus:  Mucosa of the esophagus was normal. GE junction was unremarkable. GEJ:  37 cm Stomach:  Stomach was empty and distended very well with insufflation. Folds in the proximal stomach were normal. Examination mucosa gastric body was normal. 2 scars noted at antrum along with you meatus and erythematous prepyloric fold. Pyloric channel was patent. Tenderness fundus and cardia were unremarkable. Duodenum:  Normal bulbar and post bulbar mucosa.  Therapeutic/Diagnostic Maneuvers Performed:  Biopsy taken from prepyloric  expanded fold for routine histology.  COLONOSCOPY Description of procedure:  After a digital rectal exam was performed, that colonoscope was advanced from the anus through the rectum and colon to the area of the cecum, ileocecal valve and appendiceal orifice. The cecum was deeply intubated. These structures were well-seen and photographed for the record. From the level of the cecum and ileocecal valve, the scope was slowly and cautiously withdrawn. The mucosal surfaces were carefully surveyed utilizing scope tip to flexion to facilitate fold flattening as needed. The scope was pulled down into the rectum where a thorough exam including retroflexion was performed.  Findings:   Prep fair to satisfactory. She had thick liquid stool predominating proximal half of colon. Two cecal and four AV malformations noted at ascending colon without stigmata of bleed. Two small polyps ablated via cold biopsy from cecum. One of these polyps was located over ileocecal valve. These polyps were submitted together. Moderate number of diverticula at sigmoid colon. Normal rectal mucosa and anal rectal junction.  Therapeutic/Diagnostic Maneuvers Performed:  See above  Complications:  None  Cecal Withdrawal Time:  10 minutes  Impression:  EGD findings; No evidence of active PUD. Two antral scars indicative of healed ulcers. Antral gastritis with prominent fold. Biopsy taken.  Colonoscopy findings; Two small cecal polyps ablated via cold biopsy and submitted together. Two cecal and four ascending colon AV malformation without stigmata of bleed. Moderate number of diverticula at sigmoid colon.   Recommendations:  Standard instructions given. Patient advised to take pantoprazole 30 minutes before breakfast daily. She advised to keep symptom diary as to vomiting episodes over the next 4 weeks. I will be contacting patient with biopsy results  and further recommendations.  Helayna Dun U  07/15/2014 3:06  PM  CC: Dr. Alonza Bogus, MD & Dr. Rayne Du ref. provider found

## 2014-07-15 NOTE — H&P (Signed)
[name] is an 79 y.o. female.   Chief Complaint: Patient is here for EGD and colonoscopy. HPI: Patient is 79 year old Caucasian female who has history of GERD and now presents with postprandial nausea and vomiting started about 6 weeks ago. Vomiting shinny occurs within and out of meals. She may vomit daily for week or off-and-on. She denies hematemesis dysphagia mother now rectal bleeding. She is on low-dose aspirin but does not take other NSAIDs.  She feels some better since she has been on pantoprazole. She had gallbladder surgery several years ago. She is also undergoing colonoscopy for screening purposes. Last exam was 12 years ago. Family history is negative for CRC.  Past Medical History  Diagnosis Date  . Arthritis   . Hypertension   . PONV (postoperative nausea and vomiting)     severe n/v after every surgery  . Hypothyroidism   . Hypothyroid   . Peripheral vascular disease   . Osteoarthritis   . GERD (gastroesophageal reflux disease)   . Constipation due to pain medication   . COPD (chronic obstructive pulmonary disease)   . Depression     Past Surgical History  Procedure Laterality Date  . Total knee arthroplasty    . Carotid stent    . Shoulder surgery    . Abdominal hysterectomy    . Cholecystectomy    . Back surgery    . Rotator cuff repair    . Eye surgery      cataract /lens both eyes  . Vascular surgery    . Joint replacement  [id number]    knees  . Anterior cervical decomp/discectomy fusion N/A 04/03/2012    Procedure: ANTERIOR CERVICAL DECOMPRESSION/DISCECTOMY FUSION 2 LEVELS;  Surgeon: Hosie Spangle, MD;  Location: [facility];  Service: Neurosurgery;  Laterality: N/A;  Cervical four-five,Cervical five-six anterior cervical decompression with fusion plating and bonegraft  . Cholecystectomy      History reviewed. No pertinent family history. Social History:  reports that she has been smoking Cigarettes.  She has a 22.5 pack-year smoking  history. She uses smokeless tobacco. She reports that she does not drink alcohol or use illicit drugs.  Allergies:  Allergies  Allergen Reactions  . Aleve [Naproxen Sodium] Hives and Palpitations  . Celebrex [Celecoxib] Nausea And Vomiting  . Codeine Nausea And Vomiting  . Morphine And Related Nausea And Vomiting    Medications Prior to Admission  Medication Sig Dispense Refill  . Ascorbic Acid (VITAMIN C) 1000 MG tablet Take 1,000 mg by mouth daily.     . B Complex Vitamins (VITAMIN-B COMPLEX PO) Take by mouth.    . Biotin 1000 MCG tablet Take 500 mcg by mouth daily.     Marland Kitchen CALCIUM-VITAMIN D PO Take 2 capsules by mouth daily. Calcium 1200mg  + Vitamin D (unknown strength)    . DULoxetine (CYMBALTA) 60 MG capsule Take 60 mg by mouth daily.    . Flaxseed Oil OIL Take 1 capsule by mouth daily.    . Flaxseed, Linseed, (FLAX SEED OIL) 1000 MG CAPS Take 1 capsule by mouth daily.     Marland Kitchen levothyroxine (SYNTHROID, LEVOTHROID) 88 MCG tablet Take 1 tablet (88 mcg total) by mouth daily. 30 tablet 1  . Multiple Vitamin (MULTIVITAMIN WITH MINERALS) TABS Take 1 tablet by mouth daily.    . niacin 500 MG tablet Take 500 mg by mouth at bedtime.    . niacin 500 MG tablet Take 500 mg by mouth at bedtime.    . Omega-3  Fatty Acids (FISH OIL) 1200 MG CAPS Take 1,200 mg by mouth daily.    . pantoprazole (PROTONIX) 40 MG tablet Take 40 mg by mouth daily.    . polyethylene glycol-electrolytes (NULYTELY/GOLYTELY) 420 G solution Take 4,000 mLs by mouth once. 4000 mL 0  . aspirin EC 81 MG tablet Take 81 mg by mouth every other day.     . diltiazem (CARDIZEM CD) 120 MG 24 hr capsule Take 1 capsule (120 mg total) by mouth every evening. 30 capsule 1    No results found for this or any previous visit (from the past 48 hour(s)). No results found.  ROS  Blood pressure 152/58, pulse 82, temperature 98 F (36.7 C), temperature source Oral, resp. rate 20, height 5' 1.5" (1.562 m), weight 130 lb (58.968 kg), SpO2 95  %. Physical Exam  Constitutional: She appears well-developed and well-nourished.  HENT:  Mouth/Throat: Oropharynx is clear and moist.  Eyes: Conjunctivae are normal. No scleral icterus.  Neck: No thyromegaly present.  Cardiovascular: Normal rate, regular rhythm and normal heart sounds.   No murmur heard. Respiratory: Effort normal and breath sounds normal.  GI: Soft. She exhibits no distension and no mass. Rebound: mild midepigastric tenderness.  Musculoskeletal: She exhibits no edema.  Lymphadenopathy:    She has no cervical adenopathy.  Neurological: She is alert.  Skin: Skin is warm and dry.     Assessment/Plan Epigastric pain nausea and vomiting. Chronic GERD. Diagnostic EGD and average risk screening colonoscopy.  Jasmin Trumbull U 07/15/2014, 2:16 PM

## 2014-07-15 NOTE — Discharge Instructions (Signed)
Resume usual medications. Remember to take pantoprazole 30 minutes before breakfast daily. High fiber diet. No driving for 24 hours. Symptom diary as to frequency of vomiting episodes over the next 4 weeks. Physician will call with biopsy results.  Gastrointestinal Endoscopy, Care After Refer to this sheet in the next few weeks. These instructions provide you with information on caring for yourself after your procedure. Your caregiver may also give you more specific instructions. Your treatment has been planned according to current medical practices, but problems sometimes occur. Call your caregiver if you have any problems or questions after your procedure. HOME CARE INSTRUCTIONS  If you were given medicine to help you relax (sedative), do not drive, operate machinery, or sign important documents for 24 hours.  Avoid alcohol and hot or warm beverages for the first 24 hours after the procedure.  Only take over-the-counter or prescription medicines for pain, discomfort, or fever as directed by your caregiver. You may resume taking your normal medicines unless your caregiver tells you otherwise. Ask your caregiver when you may resume taking medicines that may cause bleeding, such as aspirin, clopidogrel, or warfarin.  You may return to your normal diet and activities on the day after your procedure, or as directed by your caregiver. Walking may help to reduce any bloated feeling in your abdomen.  Drink enough fluids to keep your urine clear or pale yellow.  You may gargle with salt water if you have a sore throat. SEEK IMMEDIATE MEDICAL CARE IF:  You have severe nausea or vomiting.  You have severe abdominal pain, abdominal cramps that last longer than 6 hours, or abdominal swelling (distention).  You have severe shoulder or back pain.  You have trouble swallowing.  You have shortness of breath, your breathing is shallow, or you are breathing faster than normal.  You have a fever or a  rapid heartbeat.  You vomit blood or material that looks like coffee grounds.  You have bloody, black, or tarry stools. MAKE SURE YOU:  Understand these instructions.  Will watch your condition.  Will get help right away if you are not doing well or get worse. Document Released: 09/21/2003 Document Revised: 06/23/2013 Document Reviewed: 05/09/2011 [facility] Patient Information 2015 [place], [place]. This information is not intended to replace advice given to you by your health care provider. Make sure you discuss any questions you have with your health care provider.   High-Fiber Diet Fiber is found in fruits, vegetables, and grains. A high-fiber diet encourages the addition of more whole grains, legumes, fruits, and vegetables in your diet. The recommended amount of fiber for adult males is 38 g per day. For adult females, it is 25 g per day. Pregnant and lactating women should get 28 g of fiber per day. If you have a digestive or bowel problem, ask your caregiver for advice before adding high-fiber foods to your diet. Eat a variety of high-fiber foods instead of only a select few type of foods.  PURPOSE  To increase stool bulk.  To make bowel movements more regular to prevent constipation.  To lower cholesterol.  To prevent overeating. WHEN IS THIS DIET USED?  It may be used if you have constipation and hemorrhoids.  It may be used if you have uncomplicated diverticulosis (intestine condition) and irritable bowel syndrome.  It may be used if you need help with weight management.  It may be used if you want to add it to your diet as a protective measure against atherosclerosis, diabetes, and  cancer. SOURCES OF FIBER  Whole-grain breads and cereals.  Fruits, such as apples, oranges, bananas, berries, prunes, and pears.  Vegetables, such as green peas, carrots, sweet potatoes, beets, broccoli, cabbage, spinach, and artichokes.  Legumes, such split peas, soy,  lentils.  Almonds. FIBER CONTENT IN FOODS Starches and Grains / Dietary Fiber (g)  Cheerios, 1 cup / 3 g  Corn Flakes cereal, 1 cup / 0.7 g  Rice crispy treat cereal, 1 cup / 0.3 g  Instant oatmeal (cooked),  cup / 2 g  Frosted wheat cereal, 1 cup / 5.1 g  Brown, long-grain rice (cooked), 1 cup / 3.5 g  White, long-grain rice (cooked), 1 cup / 0.6 g  Enriched macaroni (cooked), 1 cup / 2.5 g Legumes / Dietary Fiber (g)  Baked beans (canned, plain, or vegetarian),  cup / 5.2 g  Kidney beans (canned),  cup / 6.8 g  Pinto beans (cooked),  cup / 5.5 g Breads and Crackers / Dietary Fiber (g)  Plain or honey graham crackers, 2 squares / 0.7 g  Saltine crackers, 3 squares / 0.3 g  Plain, salted pretzels, 10 pieces / 1.8 g  Whole-wheat bread, 1 slice / 1.9 g  White bread, 1 slice / 0.7 g  Raisin bread, 1 slice / 1.2 g  Plain bagel, 3 oz / 2 g  Flour tortilla, 1 oz / 0.9 g  Corn tortilla, 1 small / 1.5 g  Hamburger or hotdog bun, 1 small / 0.9 g Fruits / Dietary Fiber (g)  Apple with skin, 1 medium / 4.4 g  Sweetened applesauce,  cup / 1.5 g  Banana,  medium / 1.5 g  Grapes, 10 grapes / 0.4 g  Orange, 1 small / 2.3 g  Raisin, 1.5 oz / 1.6 g  Melon, 1 cup / 1.4 g Vegetables / Dietary Fiber (g)  Green beans (canned),  cup / 1.3 g  Carrots (cooked),  cup / 2.3 g  Broccoli (cooked),  cup / 2.8 g  Peas (cooked),  cup / 4.4 g  Mashed potatoes,  cup / 1.6 g  Lettuce, 1 cup / 0.5 g  Corn (canned),  cup / 1.6 g  Tomato,  cup / 1.1 g Document Released: 02/06/2005 Document Revised: 08/08/2011 Document Reviewed: 05/11/2011 [employer] Patient Information 2015 [place], [place]. This information is not intended to replace advice given to you by your health care provider. Make sure you discuss any questions you have with your health care provider.

## 2014-07-21 ENCOUNTER — Encounter (INDEPENDENT_AMBULATORY_CARE_PROVIDER_SITE_OTHER): Payer: Self-pay | Admitting: *Deleted

## 2014-07-21 ENCOUNTER — Encounter (HOSPITAL_COMMUNITY): Payer: Self-pay | Admitting: Internal Medicine

## 2014-09-07 ENCOUNTER — Other Ambulatory Visit (INDEPENDENT_AMBULATORY_CARE_PROVIDER_SITE_OTHER): Payer: Self-pay | Admitting: *Deleted

## 2014-09-07 MED ORDER — ONDANSETRON HCL 4 MG PO TABS: 4.0000 mg | ORAL_TABLET | Freq: Two times a day (BID) | ORAL | Status: DC

## 2014-09-07 NOTE — Telephone Encounter (Signed)
Per Dr.Rehman call in Zofran 4 mg BID # 20 no refills. Patient will need a office appointment this Thursday 09/10/14. This was e-scribed to [facility].

## 2014-09-10 ENCOUNTER — Encounter (INDEPENDENT_AMBULATORY_CARE_PROVIDER_SITE_OTHER): Payer: Self-pay | Admitting: Internal Medicine

## 2014-09-10 ENCOUNTER — Other Ambulatory Visit (INDEPENDENT_AMBULATORY_CARE_PROVIDER_SITE_OTHER): Payer: Self-pay | Admitting: Internal Medicine

## 2014-09-10 ENCOUNTER — Ambulatory Visit (INDEPENDENT_AMBULATORY_CARE_PROVIDER_SITE_OTHER): Payer: Commercial Managed Care - HMO | Admitting: Internal Medicine

## 2014-09-10 VITALS — BP 130/68 | HR 78 | Temp 99.5°F | Resp 18 | Ht 61.5 in | Wt 128.5 lb

## 2014-09-10 DIAGNOSIS — R1013 Epigastric pain: Secondary | ICD-10-CM | POA: Diagnosis not present

## 2014-09-10 DIAGNOSIS — R634 Abnormal weight loss: Secondary | ICD-10-CM | POA: Diagnosis not present

## 2014-09-10 DIAGNOSIS — R110 Nausea: Secondary | ICD-10-CM | POA: Diagnosis not present

## 2014-09-10 DIAGNOSIS — R1010 Upper abdominal pain, unspecified: Secondary | ICD-10-CM | POA: Diagnosis not present

## 2014-09-10 DIAGNOSIS — R112 Nausea with vomiting, unspecified: Secondary | ICD-10-CM | POA: Diagnosis not present

## 2014-09-10 DIAGNOSIS — K551 Chronic vascular disorders of intestine: Secondary | ICD-10-CM | POA: Diagnosis not present

## 2014-09-10 LAB — CBC
HCT: 31.2 % — ABNORMAL LOW (ref 36.0–46.0)
Hemoglobin: 10.2 g/dL — ABNORMAL LOW (ref 12.0–15.0)
MCH: 27.1 pg (ref 26.0–34.0)
MCHC: 32.7 g/dL (ref 30.0–36.0)
MCV: 82.8 fL (ref 78.0–100.0)
MPV: 9 fL (ref 8.6–12.4)
Platelets: 420 10*3/uL — ABNORMAL HIGH (ref 150–400)
RBC: 3.77 MIL/uL — ABNORMAL LOW (ref 3.87–5.11)
RDW: 14.7 % (ref 11.5–15.5)
WBC: 6.4 10*3/uL (ref 4.0–10.5)

## 2014-09-10 NOTE — Patient Instructions (Signed)
Physician will call with results of blood work and CT when completed. Phazyme 1 tablet 3 times a day as needed.

## 2014-09-10 NOTE — Progress Notes (Signed)
Presenting complaint;  Epigastric pain, nausea vomiting and weight loss.  Database;  Patient is 79 year old Caucasian female who is here for scheduled visit accompanied by her son [name]. She was last seen on 06/09/2014. She presented with postprandial epigastric pain and bloating nausea and vomiting as well as anorexia and weight loss. She was begun on pantoprazole with modest symptomatic improvement. She underwent EGD and colonoscopy on 07/15/2014. EGD revealed no evidence of peptic ulcer disease. She had to antral scars indicated of a field ulcers and she had antral gastritis with prominent folds. Antral biopsy revealed reactive gastropathy with intestinal metaplasia but no evidence of H. pylori infection. Colonoscopy revealed 2 small cecal polyps which are tubular adenomas. She also had colonic AV malformation and sigmoid colon diverticulosis. Patient was advised to continue anti-reflex measures pantoprazole and keep symptom diary until office visit.  Subjective;  Patient states she does not feel any better. In fact her symptoms of gotten worse. She remains with poor appetite. She has epigastric discomfort after most of her meals. She does not have good appetite. She burps all the time. She denies heartburn. She is staying on bland foods and soups. She has lost 3 pounds in the last 3 months she states she has lost 10 pounds this year. She has nausea and vomiting at least once a week. She generally vomits food that she eaten at prior meal. She denies hematemesis melena or rectal bleeding. Her bowels move 3 times a week. She is using MiraLAX on as-needed basis. She has difficulty sleeping at night and takes Benadryl. She states she does not take more than 5 or 6 Advil pills per month. She donated blood back in March when he 18 and again 3 weeks ago. After her recent blood donation she felt nauseated and has felt weak since then. She is on Cymbalta from fibromyalgia. She has been on Boniva for about  6 months. She has back pain and arthralgias. She states she goes to [facility] for pulled therapy. She says without it she would not be able to function. She does not drink alcohol. She has been smoking cigarettes for several years but now uses smokeless tobacco.   Current Medications: Outpatient Encounter Prescriptions as of 09/10/2014  Medication Sig  . Ascorbic Acid (VITAMIN C) 1000 MG tablet Take 1,000 mg by mouth 2 (two) times daily.   Marland Kitchen aspirin EC 81 MG tablet Take 81 mg by mouth every other day.   . B Complex Vitamins (VITAMIN-B COMPLEX PO) Take 1,000 mcg by mouth daily.   . Biotin 1000 MCG tablet Take 500 mcg by mouth daily.   Marland Kitchen CALCIUM-VITAMIN D PO Take 2 capsules by mouth daily. Calcium 1200mg  + Vitamin D (unknown strength)  . DILT-XR 120 MG 24 hr capsule Take 120 mg by mouth daily.   . DULoxetine (CYMBALTA) 60 MG capsule Take 60 mg by mouth daily.  . Flaxseed, Linseed, (FLAX SEED OIL) 1000 MG CAPS Take 1 capsule by mouth daily.   Marland Kitchen ibandronate (BONIVA) 150 MG tablet Take 150 mg by mouth every 30 (thirty) days.   Marland Kitchen levothyroxine (SYNTHROID, LEVOTHROID) 88 MCG tablet Take 1 tablet (88 mcg total) by mouth daily.  . Multiple Vitamin (MULTIVITAMIN WITH MINERALS) TABS Take 1 tablet by mouth daily.  . niacin 500 MG tablet Take 500 mg by mouth at bedtime.  . ondansetron (ZOFRAN) 4 MG tablet Take 1 tablet (4 mg total) by mouth 2 (two) times daily.  . pantoprazole (PROTONIX) 40 MG  tablet Take 40 mg by mouth daily.  . polyethylene glycol (MIRALAX / GLYCOLAX) packet Take 17 g by mouth every other day.  . [DISCONTINUED] diltiazem (CARDIZEM CD) 120 MG 24 hr capsule Take 1 capsule (120 mg total) by mouth every evening. (Patient not taking: Reported on 09/10/2014)  . [DISCONTINUED] Flaxseed Oil OIL Take 1 capsule by mouth daily.  . [DISCONTINUED] Omega-3 Fatty Acids (FISH OIL) 1200 MG CAPS Take 1,200 mg by mouth daily.   No facility-administered encounter medications on file as of  09/10/2014.   Past Medical History  Diagnosis Date  . Arthritis   . Hypertension   . PONV (postoperative nausea and vomiting)     severe n/v after every surgery  . Hypothyroidism   . Hypothyroid   . Peripheral vascular disease   . Osteoarthritis   . GERD (gastroesophageal reflux disease)   . Constipation due to pain medication   . COPD (chronic obstructive pulmonary disease)   . Depression    Past Surgical History  Procedure Laterality Date  . Total knee arthroplasty    . Carotid stent    . Shoulder surgery    . Abdominal hysterectomy    . Cholecystectomy    . Back surgery    . Rotator cuff repair    . Eye surgery      cataract /lens both eyes  . Vascular surgery    . Joint replacement  [id number]    knees  . Anterior cervical decomp/discectomy fusion N/A 04/03/2012    Procedure: ANTERIOR CERVICAL DECOMPRESSION/DISCECTOMY FUSION 2 LEVELS;  Surgeon: Hosie Spangle, MD;  Location: [facility];  Service: Neurosurgery;  Laterality: N/A;  Cervical four-five,Cervical five-six anterior cervical decompression with fusion plating and bonegraft  . Cholecystectomy    . Colonoscopy N/A 07/15/2014    Procedure: COLONOSCOPY;  Surgeon: Rogene Houston, MD;  Location: [facility];  Service: Endoscopy;  Laterality: N/A;  200 - moved to 2:10 - Ann to notify pt  . Esophagogastroduodenoscopy N/A 07/15/2014    Procedure: ESOPHAGOGASTRODUODENOSCOPY (EGD);  Surgeon: Rogene Houston, MD;  Location: [facility];  Service: Endoscopy;  Laterality: N/A;     Objective: Blood pressure 130/68, pulse 78, temperature 99.5 F (37.5 C), temperature source Oral, resp. rate 18, height 5' 1.5" (1.562 m), weight 128 lb 8 oz (58.287 kg). Patient is alert and in no acute distress. She appears chronically ill. Conjunctiva is pink. Sclera is nonicteric Oropharyngeal mucosa is normal. No neck masses or thyromegaly noted. Cardiac exam with regular rhythm normal S1 and S2. No murmur or gallop noted. Lungs are  clear to auscultation. Abdomen is symmetrical. Bowel sounds are normal. No bruits noted. On palpation abdomen is soft with mild midepigastric tenderness. No organomegaly or masses. No LE edema or clubbing noted.  Labs/studies Results: No recent lab studies on record    Assessment:  #1. Patient is 79 year old Caucasian female who presents with persistent symptoms of postprandial epigastric pain bloating frequent burping as well as nausea sporadic vomiting and 10 pound weight loss over 6 months. She has not responded to PPI therapy. Recent EGD was negative for H. pylori gastritis or peptic ulcer disease. She could have gastroparesis or chronic dyspepsia but pancreatic oh biliary tract disease needs to be ruled out particularly in light of pancreatic adenocarcinoma in her mother. Also need to make sure that she does not have anemia or hypercalcemia.   Plan:  Patient goals the lab for CBC and comprehensive chemistry panel. Will schedule her for  abdominopelvic CT with contrast. Phazyme 1 tablet by mouth 3 times a day when necessary. Further recommendations will depend on results of blood work and CT.

## 2014-09-11 LAB — COMPREHENSIVE METABOLIC PANEL
ALT: 11 U/L (ref 0–35)
AST: 18 U/L (ref 0–37)
Albumin: 3.4 g/dL — ABNORMAL LOW (ref 3.5–5.2)
Alkaline Phosphatase: 71 U/L (ref 39–117)
BILIRUBIN TOTAL: 0.3 mg/dL (ref 0.2–1.2)
BUN: 11 mg/dL (ref 6–23)
CHLORIDE: 98 meq/L (ref 96–112)
CO2: 27 mEq/L (ref 19–32)
CREATININE: 0.57 mg/dL (ref 0.50–1.10)
Calcium: 8.7 mg/dL (ref 8.4–10.5)
Glucose, Bld: 90 mg/dL (ref 70–99)
Potassium: 5.1 mEq/L (ref 3.5–5.3)
Sodium: 136 mEq/L (ref 135–145)
TOTAL PROTEIN: 6.4 g/dL (ref 6.0–8.3)

## 2014-09-14 LAB — FOLATE: Folate: >20 ng/mL

## 2014-09-14 LAB — IRON AND TIBC
%SAT: 3 % — ABNORMAL LOW (ref 20–55)
Iron: 17 ug/dL — ABNORMAL LOW (ref 42–145)
TIBC: 508 ug/dL — ABNORMAL HIGH (ref 250–470)
UIBC: 491 ug/dL — AB (ref 125–400)

## 2014-09-14 LAB — VITAMIN B12: VITAMIN B 12: 433 pg/mL (ref 211–911)

## 2014-09-15 ENCOUNTER — Telehealth (INDEPENDENT_AMBULATORY_CARE_PROVIDER_SITE_OTHER): Payer: Self-pay | Admitting: *Deleted

## 2014-09-15 ENCOUNTER — Ambulatory Visit (HOSPITAL_COMMUNITY): Payer: Commercial Managed Care - HMO

## 2014-09-15 ENCOUNTER — Encounter (HOSPITAL_COMMUNITY): Payer: Self-pay

## 2014-09-15 ENCOUNTER — Ambulatory Visit (HOSPITAL_COMMUNITY)
Admission: RE | Admit: 2014-09-15 | Discharge: 2014-09-15 | Disposition: A | Discharge status: Home / Self Care | Payer: Commercial Managed Care - HMO | Source: Ambulatory Visit | Attending: Emergency Medicine | Admitting: Emergency Medicine

## 2014-09-15 ENCOUNTER — Encounter (HOSPITAL_COMMUNITY): Payer: Self-pay | Admitting: Emergency Medicine

## 2014-09-15 ENCOUNTER — Encounter (HOSPITAL_COMMUNITY): Payer: Self-pay | Admitting: Family Medicine

## 2014-09-15 ENCOUNTER — Inpatient Hospital Stay (HOSPITAL_COMMUNITY)
Admission: EM | Admit: 2014-09-15 | Discharge: 2014-09-17 | DRG: 395 | Disposition: A | Discharge status: Home / Self Care | Payer: Commercial Managed Care - HMO | Attending: Pulmonary Disease | Admitting: Pulmonary Disease

## 2014-09-15 DIAGNOSIS — R109 Unspecified abdominal pain: Secondary | ICD-10-CM

## 2014-09-15 DIAGNOSIS — K219 Gastro-esophageal reflux disease without esophagitis: Secondary | ICD-10-CM | POA: Diagnosis present

## 2014-09-15 DIAGNOSIS — D509 Iron deficiency anemia, unspecified: Secondary | ICD-10-CM | POA: Diagnosis present

## 2014-09-15 DIAGNOSIS — M4806 Spinal stenosis, lumbar region: Secondary | ICD-10-CM

## 2014-09-15 DIAGNOSIS — R1084 Generalized abdominal pain: Secondary | ICD-10-CM | POA: Diagnosis not present

## 2014-09-15 DIAGNOSIS — R112 Nausea with vomiting, unspecified: Secondary | ICD-10-CM

## 2014-09-15 DIAGNOSIS — R110 Nausea: Secondary | ICD-10-CM | POA: Diagnosis not present

## 2014-09-15 DIAGNOSIS — K551 Chronic vascular disorders of intestine: Secondary | ICD-10-CM | POA: Diagnosis not present

## 2014-09-15 DIAGNOSIS — F17210 Nicotine dependence, cigarettes, uncomplicated: Secondary | ICD-10-CM | POA: Diagnosis present

## 2014-09-15 DIAGNOSIS — I10 Essential (primary) hypertension: Secondary | ICD-10-CM | POA: Diagnosis present

## 2014-09-15 DIAGNOSIS — Z7982 Long term (current) use of aspirin: Secondary | ICD-10-CM

## 2014-09-15 DIAGNOSIS — F329 Major depressive disorder, single episode, unspecified: Secondary | ICD-10-CM | POA: Diagnosis not present

## 2014-09-15 DIAGNOSIS — I771 Stricture of artery: Secondary | ICD-10-CM | POA: Diagnosis not present

## 2014-09-15 DIAGNOSIS — Z96659 Presence of unspecified artificial knee joint: Secondary | ICD-10-CM | POA: Diagnosis present

## 2014-09-15 DIAGNOSIS — M797 Fibromyalgia: Secondary | ICD-10-CM | POA: Diagnosis not present

## 2014-09-15 DIAGNOSIS — F17200 Nicotine dependence, unspecified, uncomplicated: Secondary | ICD-10-CM | POA: Diagnosis present

## 2014-09-15 DIAGNOSIS — R197 Diarrhea, unspecified: Secondary | ICD-10-CM | POA: Insufficient documentation

## 2014-09-15 DIAGNOSIS — R1012 Left upper quadrant pain: Secondary | ICD-10-CM | POA: Diagnosis not present

## 2014-09-15 DIAGNOSIS — K5730 Diverticulosis of large intestine without perforation or abscess without bleeding: Secondary | ICD-10-CM | POA: Diagnosis not present

## 2014-09-15 DIAGNOSIS — J449 Chronic obstructive pulmonary disease, unspecified: Secondary | ICD-10-CM | POA: Diagnosis not present

## 2014-09-15 DIAGNOSIS — R1010 Upper abdominal pain, unspecified: Secondary | ICD-10-CM

## 2014-09-15 DIAGNOSIS — I2510 Atherosclerotic heart disease of native coronary artery without angina pectoris: Secondary | ICD-10-CM | POA: Insufficient documentation

## 2014-09-15 DIAGNOSIS — E039 Hypothyroidism, unspecified: Secondary | ICD-10-CM | POA: Diagnosis present

## 2014-09-15 DIAGNOSIS — G8929 Other chronic pain: Secondary | ICD-10-CM | POA: Diagnosis present

## 2014-09-15 DIAGNOSIS — F32A Depression, unspecified: Secondary | ICD-10-CM

## 2014-09-15 DIAGNOSIS — Z720 Tobacco use: Secondary | ICD-10-CM

## 2014-09-15 DIAGNOSIS — R1011 Right upper quadrant pain: Secondary | ICD-10-CM | POA: Diagnosis not present

## 2014-09-15 DIAGNOSIS — I700 Atherosclerosis of aorta: Secondary | ICD-10-CM | POA: Diagnosis not present

## 2014-09-15 DIAGNOSIS — R45851 Suicidal ideations: Secondary | ICD-10-CM

## 2014-09-15 HISTORY — DX: Fibromyalgia: M79.7

## 2014-09-15 HISTORY — DX: Gastritis, unspecified, without bleeding: K29.70

## 2014-09-15 HISTORY — DX: Diverticulosis of intestine, part unspecified, without perforation or abscess without bleeding: K57.90

## 2014-09-15 LAB — COMPREHENSIVE METABOLIC PANEL
ALT: 17 U/L (ref 14–54)
AST: 23 U/L (ref 15–41)
Albumin: 3.8 g/dL (ref 3.5–5.0)
Alkaline Phosphatase: 67 U/L (ref 38–126)
Anion gap: 12 (ref 5–15)
BILIRUBIN TOTAL: 0.7 mg/dL (ref 0.3–1.2)
BUN: 19 mg/dL (ref 6–20)
CHLORIDE: 96 mmol/L — AB (ref 101–111)
CO2: 25 mmol/L (ref 22–32)
Calcium: 8.8 mg/dL — ABNORMAL LOW (ref 8.9–10.3)
Creatinine, Ser: 1.03 mg/dL — ABNORMAL HIGH (ref 0.44–1.00)
GFR calc Af Amer: 60 mL/min — ABNORMAL LOW (ref >60)
GFR calc non Af Amer: 51 mL/min — ABNORMAL LOW (ref >60)
Glucose, Bld: 143 mg/dL — ABNORMAL HIGH (ref 65–99)
Potassium: 3.7 mmol/L (ref 3.5–5.1)
Sodium: 133 mmol/L — ABNORMAL LOW (ref 135–145)
Total Protein: 7.1 g/dL (ref 6.5–8.1)

## 2014-09-15 LAB — URINALYSIS, ROUTINE W REFLEX MICROSCOPIC
BILIRUBIN URINE: NEGATIVE
GLUCOSE, UA: NEGATIVE mg/dL
Hgb urine dipstick: NEGATIVE
Leukocytes, UA: NEGATIVE
NITRITE: NEGATIVE
PROTEIN: NEGATIVE mg/dL
Specific Gravity, Urine: <1.005 — ABNORMAL LOW (ref 1.005–1.030)
Urobilinogen, UA: 0.2 mg/dL (ref 0.0–1.0)
pH: 6 (ref 5.0–8.0)

## 2014-09-15 LAB — CBC WITH DIFFERENTIAL/PLATELET
BASOS PCT: 0 % (ref 0–1)
Basophils Absolute: 0 10*3/uL (ref 0.0–0.1)
EOS PCT: 0 % (ref 0–5)
Eosinophils Absolute: 0 10*3/uL (ref 0.0–0.7)
HCT: 33.4 % — ABNORMAL LOW (ref 36.0–46.0)
Hemoglobin: 11.1 g/dL — ABNORMAL LOW (ref 12.0–15.0)
LYMPHS ABS: 0.9 10*3/uL (ref 0.7–4.0)
Lymphocytes Relative: 13 % (ref 12–46)
MCH: 27.1 pg (ref 26.0–34.0)
MCHC: 33.2 g/dL (ref 30.0–36.0)
MCV: 81.7 fL (ref 78.0–100.0)
Monocytes Absolute: 0.4 10*3/uL (ref 0.1–1.0)
Monocytes Relative: 6 % (ref 3–12)
NEUTROS PCT: 81 % — AB (ref 43–77)
Neutro Abs: 5.8 10*3/uL (ref 1.7–7.7)
PLATELETS: 441 10*3/uL — AB (ref 150–400)
RBC: 4.09 MIL/uL (ref 3.87–5.11)
RDW: 14.6 % (ref 11.5–15.5)
WBC: 7.1 10*3/uL (ref 4.0–10.5)

## 2014-09-15 LAB — AMYLASE: Amylase: 51 U/L (ref 28–100)

## 2014-09-15 LAB — LACTIC ACID, PLASMA
LACTIC ACID, VENOUS: 1.8 mmol/L (ref 0.5–2.0)
Lactic Acid, Venous: 1 mmol/L (ref 0.5–2.0)

## 2014-09-15 LAB — LIPASE, BLOOD: Lipase: 19 U/L — ABNORMAL LOW (ref 22–51)

## 2014-09-15 MED ORDER — SODIUM CHLORIDE 0.9 % IV SOLN
INTRAVENOUS | Status: DC
  Administered 2014-09-15: 13:00:00 via INTRAVENOUS

## 2014-09-15 MED ORDER — FAMOTIDINE IN NACL 20-0.9 MG/50ML-% IV SOLN
20.0000 mg | Freq: Once | INTRAVENOUS | Status: AC
  Administered 2014-09-15: 20 mg via INTRAVENOUS
  Filled 2014-09-15: qty 50

## 2014-09-15 MED ORDER — PANTOPRAZOLE SODIUM 40 MG PO TBEC
40.0000 mg | DELAYED_RELEASE_TABLET | Freq: Every day | ORAL | Status: DC
  Administered 2014-09-15 – 2014-09-17 (×3): 40 mg via ORAL
  Filled 2014-09-15 (×3): qty 1

## 2014-09-15 MED ORDER — NIACIN 500 MG PO TABS
500.0000 mg | ORAL_TABLET | Freq: Every day | ORAL | Status: DC
  Administered 2014-09-16: 500 mg via ORAL
  Filled 2014-09-15 (×3): qty 1

## 2014-09-15 MED ORDER — SODIUM CHLORIDE 0.9 % IV SOLN: INTRAVENOUS | Status: AC

## 2014-09-15 MED ORDER — CETYLPYRIDINIUM CHLORIDE 0.05 % MT LIQD
7.0000 mL | Freq: Two times a day (BID) | OROMUCOSAL | Status: DC
  Administered 2014-09-15 – 2014-09-17 (×4): 7 mL via OROMUCOSAL

## 2014-09-15 MED ORDER — DULOXETINE HCL 60 MG PO CPEP
60.0000 mg | ORAL_CAPSULE | Freq: Every day | ORAL | Status: DC
  Administered 2014-09-16 – 2014-09-17 (×2): 60 mg via ORAL
  Filled 2014-09-15 (×2): qty 1

## 2014-09-15 MED ORDER — OXYCODONE HCL 5 MG PO TABS
5.0000 mg | ORAL_TABLET | ORAL | Status: DC | PRN
  Administered 2014-09-15: 5 mg via ORAL
  Filled 2014-09-15: qty 1

## 2014-09-15 MED ORDER — BIOTIN 1000 MCG PO TABS
500.0000 ug | ORAL_TABLET | Freq: Every day | ORAL | Status: DC
Start: 2014-09-15 — End: 2014-09-15

## 2014-09-15 MED ORDER — HYDROMORPHONE HCL 1 MG/ML IJ SOLN
0.5000 mg | Freq: Once | INTRAMUSCULAR | Status: AC
  Administered 2014-09-15: 0.5 mg via INTRAVENOUS
  Filled 2014-09-15: qty 1

## 2014-09-15 MED ORDER — ACETAMINOPHEN 325 MG PO TABS
650.0000 mg | ORAL_TABLET | Freq: Four times a day (QID) | ORAL | Status: DC | PRN
Start: 2014-09-15 — End: 2014-09-17

## 2014-09-15 MED ORDER — HYDROMORPHONE HCL 1 MG/ML IJ SOLN: 0.5000 mg | INTRAMUSCULAR | Status: AC | PRN

## 2014-09-15 MED ORDER — ACETAMINOPHEN 650 MG RE SUPP: 650.0000 mg | Freq: Four times a day (QID) | RECTAL | Status: DC | PRN

## 2014-09-15 MED ORDER — HYDRALAZINE HCL 20 MG/ML IJ SOLN: 5.0000 mg | INTRAMUSCULAR | Status: DC | PRN

## 2014-09-15 MED ORDER — NICOTINE 14 MG/24HR TD PT24
14.0000 mg | MEDICATED_PATCH | Freq: Every day | TRANSDERMAL | Status: DC
  Administered 2014-09-17: 14 mg via TRANSDERMAL
  Filled 2014-09-15 (×3): qty 1

## 2014-09-15 MED ORDER — SODIUM CHLORIDE 0.9 % IV SOLN
INTRAVENOUS | Status: DC
  Administered 2014-09-16 (×3): via INTRAVENOUS

## 2014-09-15 MED ORDER — DILTIAZEM HCL ER 120 MG PO CP24
120.0000 mg | ORAL_CAPSULE | Freq: Every day | ORAL | Status: DC
  Administered 2014-09-16 – 2014-09-17 (×2): 120 mg via ORAL
  Filled 2014-09-15 (×5): qty 1

## 2014-09-15 MED ORDER — LEVOTHYROXINE SODIUM 88 MCG PO TABS
88.0000 ug | ORAL_TABLET | Freq: Every day | ORAL | Status: DC
  Administered 2014-09-16 – 2014-09-17 (×2): 88 ug via ORAL
  Filled 2014-09-15 (×2): qty 1

## 2014-09-15 MED ORDER — HEPARIN SODIUM (PORCINE) 5000 UNIT/ML IJ SOLN
5000.0000 [IU] | Freq: Three times a day (TID) | INTRAMUSCULAR | Status: DC
  Administered 2014-09-15 – 2014-09-17 (×5): 5000 [IU] via SUBCUTANEOUS
  Filled 2014-09-15 (×5): qty 1

## 2014-09-15 MED ORDER — ONDANSETRON HCL 4 MG/2ML IJ SOLN
4.0000 mg | INTRAMUSCULAR | Status: AC | PRN
  Administered 2014-09-15 (×2): 4 mg via INTRAVENOUS
  Filled 2014-09-15 (×2): qty 2

## 2014-09-15 MED ORDER — FENTANYL CITRATE (PF) 100 MCG/2ML IJ SOLN
12.5000 ug | Freq: Once | INTRAMUSCULAR | Status: AC
  Administered 2014-09-15: 12.5 ug via INTRAVENOUS
  Filled 2014-09-15: qty 2

## 2014-09-15 MED ORDER — ONDANSETRON HCL 4 MG/2ML IJ SOLN
4.0000 mg | Freq: Four times a day (QID) | INTRAMUSCULAR | Status: DC | PRN
  Administered 2014-09-16: 4 mg via INTRAVENOUS
  Filled 2014-09-15: qty 2

## 2014-09-15 MED ORDER — ONDANSETRON HCL 4 MG PO TABS: 4.0000 mg | ORAL_TABLET | Freq: Four times a day (QID) | ORAL | Status: DC | PRN

## 2014-09-15 MED ORDER — ASPIRIN EC 81 MG PO TBEC
81.0000 mg | DELAYED_RELEASE_TABLET | ORAL | Status: DC
  Administered 2014-09-16: 81 mg via ORAL
  Filled 2014-09-15 (×2): qty 1

## 2014-09-15 MED ORDER — IOHEXOL 300 MG/ML  SOLN
100.0000 mL | Freq: Once | INTRAMUSCULAR | Status: AC | PRN
  Administered 2014-09-15: 100 mL via INTRAVENOUS

## 2014-09-15 MED ORDER — ONDANSETRON HCL 4 MG/2ML IJ SOLN: 4.0000 mg | Freq: Three times a day (TID) | INTRAMUSCULAR | Status: AC | PRN

## 2014-09-15 NOTE — ED Notes (Signed)
Pt states that she has been having abdominal pain with n/v/d for the past few weeks.  States was scheduled for outpatient CT by Dr Shonna Chock today but that pain has worsened since Sunday and she didn't feel that she could wait.

## 2014-09-15 NOTE — ED Provider Notes (Signed)
CSN: [id number]     Arrival date & time 09/15/14  1136 History   First MD Initiated Contact with Patient 09/15/14 1208     Chief Complaint  Patient presents with  . Abdominal Pain      HPI Pt was seen at 1215.  Per pt, c/o gradual onset and persistence of constant upper abd "pain" for the past 3 months, worse over the past 2 days. Has been associated with multiple intermittent episodes of N/V.  Describes the abd pain as "bloating."  Denies diarrhea, no fevers, no back pain, no rash, no CP/SOB, no black or blood in stools or emesis.  The symptoms have been associated with no other complaints. The patient has a significant history of similar symptoms previously, recently being evaluated for this complaint by her GI MD. Pt states she was sent to the hospital for "a CT scan of my abd."       Past Medical History  Diagnosis Date  . Arthritis   . Hypertension   . PONV (postoperative nausea and vomiting)     severe n/v after every surgery  . Hypothyroidism   . Hypothyroid   . Peripheral vascular disease   . Osteoarthritis   . GERD (gastroesophageal reflux disease)   . Constipation due to pain medication   . COPD (chronic obstructive pulmonary disease)   . Depression   . Gastritis   . Diverticulosis   . Fibromyalgia    Past Surgical History  Procedure Laterality Date  . Total knee arthroplasty    . Carotid stent    . Shoulder surgery    . Abdominal hysterectomy    . Cholecystectomy    . Back surgery    . Rotator cuff repair    . Eye surgery      cataract /lens both eyes  . Vascular surgery    . Joint replacement  [id number]    knees  . Anterior cervical decomp/discectomy fusion N/A 04/03/2012    Procedure: ANTERIOR CERVICAL DECOMPRESSION/DISCECTOMY FUSION 2 LEVELS;  Surgeon: Hosie Spangle, MD;  Location: [facility];  Service: Neurosurgery;  Laterality: N/A;  Cervical four-five,Cervical five-six anterior cervical decompression with fusion plating and bonegraft  .  Cholecystectomy    . Colonoscopy N/A 07/15/2014    Procedure: COLONOSCOPY;  Surgeon: Rogene Houston, MD;  [facility]: [facility];  Service: Endoscopy;  Laterality: N/A;  200 - moved to 2:10 - Ann to notify pt  . Esophagogastroduodenoscopy N/A 07/15/2014    Procedure: ESOPHAGOGASTRODUODENOSCOPY (EGD);  Surgeon: Rogene Houston, MD;  Location: [facility];  Service: Endoscopy;  Laterality: N/A;   History reviewed. No pertinent family history. History  Substance Use Topics  . Smoking status: Light Tobacco Smoker -- 0.50 packs/day for 45 years    Types: Cigarettes  . Smokeless tobacco: Current User     Comment:  Patient is using the E-Cigarettes  . Alcohol Use: No    Review of Systems ROS: Statement: All systems negative except as marked or noted in the HPI; Constitutional: Negative for fever and chills. ; ; Eyes: Negative for eye pain, redness and discharge. ; ; ENMT: Negative for ear pain, hoarseness, nasal congestion, sinus pressure and sore throat. ; ; Cardiovascular: Negative for chest pain, palpitations, diaphoresis, dyspnea and peripheral edema. ; ; Respiratory: Negative for cough, wheezing and stridor. ; ; Gastrointestinal: +N/V, abd pain. Negative for diarrhea, blood in stool, hematemesis, jaundice and rectal bleeding. . ; ; Genitourinary: Negative for dysuria, flank pain and hematuria. ; ;  Musculoskeletal: Negative for back pain and neck pain. Negative for swelling and trauma.; ; Skin: Negative for pruritus, rash, abrasions, blisters, bruising and skin lesion.; ; Neuro: Negative for headache, lightheadedness and neck stiffness. Negative for weakness, altered level of consciousness , altered mental status, extremity weakness, paresthesias, involuntary movement, seizure and syncope.      Allergies  Aleve; Celebrex; Codeine; and Morphine and related  Home Medications   Prior to Admission medications   Medication Sig Start Date End Date Taking? Authorizing Provider  Ascorbic Acid  (VITAMIN C) 1000 MG tablet Take 1,000 mg by mouth 2 (two) times daily.    Yes Historical Provider, MD  aspirin EC 81 MG tablet Take 81 mg by mouth every other day.    Yes Historical Provider, MD  B Complex Vitamins (VITAMIN-B COMPLEX PO) Take 1,000 mcg by mouth daily.    Yes Historical Provider, MD  Biotin 1000 MCG tablet Take 500 mcg by mouth daily.    Yes Historical Provider, MD  CALCIUM-VITAMIN D PO Take 2 capsules by mouth daily. Calcium 1200mg  + Vitamin D (unknown strength)   Yes Historical Provider, MD  DILT-XR 120 MG 24 hr capsule Take 120 mg by mouth daily.  09/03/14  Yes Historical Provider, MD  DULoxetine (CYMBALTA) 60 MG capsule Take 60 mg by mouth daily.   Yes Historical Provider, MD  Flaxseed, Linseed, (FLAX SEED OIL) 1000 MG CAPS Take 1 capsule by mouth daily.    Yes Historical Provider, MD  levothyroxine (SYNTHROID, LEVOTHROID) 88 MCG tablet Take 1 tablet (88 mcg total) by mouth daily. 03/12/13  Yes Ivan Anchors Love, PA-C  Multiple Vitamin (MULTIVITAMIN WITH MINERALS) TABS Take 1 tablet by mouth daily.   Yes Historical Provider, MD  niacin 500 MG tablet Take 500 mg by mouth at bedtime.   Yes Historical Provider, MD  ondansetron (ZOFRAN) 4 MG tablet Take 1 tablet (4 mg total) by mouth 2 (two) times daily. 09/07/14  Yes Rogene Houston, MD  pantoprazole (PROTONIX) 40 MG tablet Take 40 mg by mouth daily.   Yes Historical Provider, MD  polyethylene glycol (MIRALAX / GLYCOLAX) packet Take 17 g by mouth every other day.   Yes Historical Provider, MD  ibandronate (BONIVA) 150 MG tablet Take 150 mg by mouth every 30 (thirty) days.  06/08/14   Historical Provider, MD   BP 135/91 mmHg  Pulse 69  Temp(Src) 97.4 F (36.3 C) (Oral)  Resp 21  Ht 5\' 1"  (1.549 m)  Wt 128 lb (58.06 kg)  BMI 24.20 kg/m2  SpO2 91% Physical Exam  1220: Physical examination:  Nursing notes reviewed; Vital signs and O2 SAT reviewed;  Constitutional: Well developed, Well nourished, Well hydrated, In no acute distress;  Head:  Normocephalic, atraumatic; Eyes: EOMI, PERRL, No scleral icterus; ENMT: Mouth and pharynx normal, Mucous membranes moist; Neck: Supple, Full range of motion, No lymphadenopathy; Cardiovascular: Regular rate and rhythm, No gallop; Respiratory: Breath sounds clear & equal bilaterally, No wheezes.  Speaking full sentences with ease, Normal respiratory effort/excursion; Chest: Nontender, Movement normal; Abdomen: Soft, +LUQ, mid-epigastric, RUQ tenderness to palp. No rebound or guarding. Nondistended, Normal bowel sounds; Genitourinary: No CVA tenderness; Extremities: Pulses normal, No tenderness, No edema, No calf edema or asymmetry.; Neuro: AA&Ox3, Major CN grossly intact.  Speech clear. No gross focal motor or sensory deficits in extremities.; Skin: Color normal, Warm, Dry.   ED Course  Procedures    1225:  T/C to GI Dr. Laural Golden, case discussed, including:  HPI, pertinent PM/SHx, VS/PE,  dx testing, ED course and treatment and informed that CT scanner is down:  States CT scan was supposed to be completed as an outpt, pt needs CT scan to r/o pancreatic CA, evaluate blood vessels, etc, vs AXR/US.  Pt will be transported to [facility]. Pt and family informed and agreeable with plan.     1715:  Pt has returned from [facility] CT scan. CT scan as below. T/C to GI Dr. Laural Golden, case discussed, including:  HPI, pertinent PM/SHx, VS/PE, dx testing, ED course and treatment:  Requests admit to medicine service and he will consult, also requests to tell [facility] he will make consult calls to Vasc MD regarding CT scan results also.   1740:  Pt continues to c/o nausea and abd pain despite IV meds for both.  Dx and testing, as well as d/w GI MD, d/w pt and family.  Questions answered.  Verb understanding, agreeable to admit. T/C to [facility] Dr Marily Memos, case discussed, including:  HPI, pertinent PM/SHx, VS/PE, dx testing, ED course and treatment:  Agreeable to admit, requests to write temporary orders, obtain medical bed to Dr.  Luan Pulling' service.        MDM  MDM Reviewed: previous chart, nursing note and vitals Reviewed previous: labs Interpretation: labs and CT scan     Results for orders placed or performed during the hospital encounter of 09/15/14  Amylase  Result Value Ref Range   Amylase 51 28 - 100 U/L  CBC WITH DIFFERENTIAL  Result Value Ref Range   WBC 7.1 4.0 - 10.5 K/uL   RBC 4.09 3.87 - 5.11 MIL/uL   Hemoglobin 11.1 (L) 12.0 - 15.0 g/dL   HCT 33.4 (L) 36.0 - 46.0 %   MCV 81.7 78.0 - 100.0 fL   MCH 27.1 26.0 - 34.0 pg   MCHC 33.2 30.0 - 36.0 g/dL   RDW 14.6 11.5 - 15.5 %   Platelets 441 (H) 150 - 400 K/uL   Neutrophils Relative % 81 (H) 43 - 77 %   Neutro Abs 5.8 1.7 - 7.7 K/uL   Lymphocytes Relative 13 12 - 46 %   Lymphs Abs 0.9 0.7 - 4.0 K/uL   Monocytes Relative 6 3 - 12 %   Monocytes Absolute 0.4 0.1 - 1.0 K/uL   Eosinophils Relative 0 0 - 5 %   Eosinophils Absolute 0.0 0.0 - 0.7 K/uL   Basophils Relative 0 0 - 1 %   Basophils Absolute 0.0 0.0 - 0.1 K/uL  Comprehensive metabolic panel  Result Value Ref Range   Sodium 133 (L) 135 - 145 mmol/L   Potassium 3.7 3.5 - 5.1 mmol/L   Chloride 96 (L) 101 - 111 mmol/L   CO2 25 22 - 32 mmol/L   Glucose, Bld 143 (H) 65 - 99 mg/dL   BUN 19 6 - 20 mg/dL   Creatinine, Ser 1.03 (H) 0.44 - 1.00 mg/dL   Calcium 8.8 (L) 8.9 - 10.3 mg/dL   Total Protein 7.1 6.5 - 8.1 g/dL   Albumin 3.8 3.5 - 5.0 g/dL   AST 23 15 - 41 U/L   ALT 17 14 - 54 U/L   Alkaline Phosphatase 67 38 - 126 U/L   Total Bilirubin 0.7 0.3 - 1.2 mg/dL   GFR calc non Af Amer 51 (L) >60 mL/min   GFR calc Af Amer 60 (L) >60 mL/min   Anion gap 12 5 - 15  Lipase, blood  Result Value Ref Range   Lipase 19 (  L) 22 - 51 U/L  Lactic acid, plasma  Result Value Ref Range   Lactic Acid, Venous 1.8 0.5 - 2.0 mmol/L   Ct Abdomen Pelvis W Contrast 09/15/2014   CLINICAL DATA:  79 year old female with abdominal pain for a few days. Nausea, vomiting and diarrhea. Prior abdominal  hysterectomy and cholecystectomy. Initial encounter.  EXAM: CT ABDOMEN AND PELVIS WITH CONTRAST  TECHNIQUE: Multidetector CT imaging of the abdomen and pelvis was performed using the standard protocol following bolus administration of intravenous contrast.  CONTRAST:  130mL OMNIPAQUE IOHEXOL 300 MG/ML  SOLN  COMPARISON:  None.  FINDINGS: Prominent atherosclerotic type changes of the aorta and aortic branch vessels including the iliac artery and femoral arteries. These vessels are narrowed. Long segment high-grade stenosis of the proximal superior mesenteric artery.  Prominent stool rectosigmoid region. No surrounding inflammation as may be seen with stercoral colitis.  Prominent sigmoid diverticula without extra luminal bowel inflammatory process, free fluid or free air.  Post cholecystectomy with mild prominence of the common bile duct felt to be related to the age and post cholecystectomy state. No worrisome hepatic, splenic, pancreatic, renal or adrenal lesion. Left adrenal hyperplasia noted.  Noncontrast filled views of the urinary bladder unremarkable.  Soft tissue stranding inferior to the ischium greater on the right may be related to pressure phenomena without discrete skin ulcer.  Lung bases clear.  Prominent coronary artery calcifications.  Markedly abnormal appearance of the L1-2 disc space. The L1 vertebral body is collapsed into the L2 vertebral body with posterior projection of the posterior inferior aspect of the L1 vertebral body contributing to significant spinal stenosis. This is noted on prior plain film exam of 10/06/2013.  IMPRESSION: Prominent stool rectosigmoid region. No surrounding inflammation as may be seen with stercoral colitis.  Prominent sigmoid diverticula without extra luminal bowel inflammatory process, free fluid or free air.  Diffuse atherosclerotic type changes with coronary artery calcifications, narrowing of the ectatic abdominal aorta, long segment HIGH-GRADE STENOSIS proximal  superior mesenteric artery, narrowing of the iliac arteries and femoral arteries.  Markedly abnormal appearance of the L1-2 disc space. The L1 vertebral body is collapsed into the L2 vertebral body with posterior projection of the posterior inferior aspect of the L1 vertebral body contributing to significant spinal stenosis. This is noted on prior plain film exam of 10/06/2013.  Please see above.   Electronically Signed   By: Genia Del M.D.   On: 09/15/2014 15:53      Francine Graven, DO 09/18/14 [phone]

## 2014-09-15 NOTE — ED Notes (Signed)
Pt going to [facility] for ct scan via [facility]

## 2014-09-15 NOTE — ED Notes (Signed)
Back from ct at [place].  Pt states she vomited a whole lot while she was gone.  States her abdominal pain is bad again.

## 2014-09-15 NOTE — H&P (Signed)
[facility] History and Physical  [name] RSW:[id number] DOB: 30-Apr-1935 DOA: 09/15/2014  Referring physician: Dr. Thurnell Garbe - [facility] PCP: Alonza Bogus, MD   Chief Complaint: ABD pain  HPI: [name] is a 79 y.o. female  Abdominal pain. Started 3 months ago. Comes and goes. Current episode over the last 2 days has been constant and progressively getting worse. Typically in the right upper quadrant. Worse after meals. Patient has had an ongoing workup by Dr. Laural Golden who was counseled to by the ED and requested patient be admitted.   Review of Systems:  Constitutional: No weight loss, night sweats, Fevers, chills, fatigue.  HEENT:  No headaches, Difficulty swallowing,Tooth/dental problems,Sore throat,  No sneezing, itching, ear ache, nasal congestion, post nasal drip,  Cardio-vascular:  No chest pain, Orthopnea, PND, swelling in lower extremities, anasarca, dizziness, palpitations  GI: Per HPI Resp:   No shortness of breath with exertion or at rest. No excess mucus, no productive cough, No non-productive cough, No coughing up of blood.No change in color of mucus.No wheezing.No chest wall deformity  Skin:  no rash or lesions.  GU:  no dysuria, change in color of urine, no urgency or frequency. No flank pain.  Musculoskeletal:   No joint pain or swelling. No decreased range of motion. No back pain.  Psych:  No change in mood or affect. No depression or anxiety. No memory loss.   Past Medical History  Diagnosis Date  . Arthritis   . Hypertension   . PONV (postoperative nausea and vomiting)     severe n/v after every surgery  . Hypothyroidism   . Hypothyroid   . Peripheral vascular disease   . Osteoarthritis   . GERD (gastroesophageal reflux disease)   . Constipation due to pain medication   . COPD (chronic obstructive pulmonary disease)   . Depression   . Gastritis   . Diverticulosis   . Fibromyalgia    Past Surgical History  Procedure Laterality  Date  . Total knee arthroplasty    . Carotid stent    . Shoulder surgery    . Abdominal hysterectomy    . Cholecystectomy    . Back surgery    . Rotator cuff repair    . Eye surgery      cataract /lens both eyes  . Vascular surgery    . Joint replacement  [id number]    knees  . Anterior cervical decomp/discectomy fusion N/A 04/03/2012    Procedure: ANTERIOR CERVICAL DECOMPRESSION/DISCECTOMY FUSION 2 LEVELS;  Surgeon: Hosie Spangle, MD;  Location: [facility];  Service: Neurosurgery;  Laterality: N/A;  Cervical four-five,Cervical five-six anterior cervical decompression with fusion plating and bonegraft  . Cholecystectomy    . Colonoscopy N/A 07/15/2014    Procedure: COLONOSCOPY;  Surgeon: Rogene Houston, MD;  [facility];  Service: Endoscopy;  Laterality: N/A;  200 - moved to 2:10 - Ann to notify pt  . Esophagogastroduodenoscopy N/A 07/15/2014    Procedure: ESOPHAGOGASTRODUODENOSCOPY (EGD);  Surgeon: Rogene Houston, MD;  [facility];  Service: Endoscopy;  Laterality: N/A;   Social History:  reports that she has been smoking Cigarettes.  She has a 22.5 pack-year smoking history. She uses smokeless tobacco. She reports that she does not drink alcohol or use illicit drugs.  Allergies  Allergen Reactions  . Aleve [Naproxen Sodium] Hives and Palpitations  . Celebrex [Celecoxib] Nausea And Vomiting  . Codeine Nausea And Vomiting  . Morphine And Related Nausea And Vomiting  Family History  Problem Relation Age of Onset  . Pancreatic cancer Mother   . Prostate cancer Father      Prior to Admission medications   Medication Sig Start Date End Date Taking? Authorizing Provider  Ascorbic Acid (VITAMIN C) 1000 MG tablet Take 1,000 mg by mouth 2 (two) times daily.    Yes Historical Provider, MD  aspirin EC 81 MG tablet Take 81 mg by mouth every other day.    Yes Historical Provider, MD  B Complex Vitamins (VITAMIN-B COMPLEX PO) Take 1,000 mcg by mouth daily.     Yes Historical Provider, MD  Biotin 1000 MCG tablet Take 500 mcg by mouth daily.    Yes Historical Provider, MD  CALCIUM-VITAMIN D PO Take 2 capsules by mouth daily. Calcium 1200mg  + Vitamin D (unknown strength)   Yes Historical Provider, MD  DILT-XR 120 MG 24 hr capsule Take 120 mg by mouth daily.  09/03/14  Yes Historical Provider, MD  DULoxetine (CYMBALTA) 60 MG capsule Take 60 mg by mouth daily.   Yes Historical Provider, MD  Flaxseed, Linseed, (FLAX SEED OIL) 1000 MG CAPS Take 1 capsule by mouth daily.    Yes Historical Provider, MD  levothyroxine (SYNTHROID, LEVOTHROID) 88 MCG tablet Take 1 tablet (88 mcg total) by mouth daily. 03/12/13  Yes Ivan Anchors Love, PA-C  Multiple Vitamin (MULTIVITAMIN WITH MINERALS) TABS Take 1 tablet by mouth daily.   Yes Historical Provider, MD  niacin 500 MG tablet Take 500 mg by mouth at bedtime.   Yes Historical Provider, MD  ondansetron (ZOFRAN) 4 MG tablet Take 1 tablet (4 mg total) by mouth 2 (two) times daily. 09/07/14  Yes Rogene Houston, MD  pantoprazole (PROTONIX) 40 MG tablet Take 40 mg by mouth daily.   Yes Historical Provider, MD  polyethylene glycol (MIRALAX / GLYCOLAX) packet Take 17 g by mouth every other day.   Yes Historical Provider, MD  ibandronate (BONIVA) 150 MG tablet Take 150 mg by mouth every 30 (thirty) days.  06/08/14   Historical Provider, MD   Physical Exam: Filed Vitals:   09/15/14 1622 09/15/14 1630 09/15/14 1700 09/15/14 2015  BP: 202/72 201/73 222/66 185/61  Pulse: 71 71 63 87  Temp:    97.8 F (36.6 C)  TempSrc:    Oral  Resp: 14 16 12 16   Height:      Weight:    58.06 kg (128 lb)  SpO2: 96% 93% 96% 92%    Wt Readings from Last 3 Encounters:  09/15/14 58.06 kg (128 lb)  09/10/14 58.287 kg (128 lb 8 oz)  07/15/14 58.968 kg (130 lb)    General:  Appears calm and comfortable Eyes:  PERRL, normal lids, irises & conjunctiva ENT:  grossly normal hearing, lips & tongue Neck:  no LAD, masses or thyromegaly Cardiovascular:   RRR, no m/r/g. No LE edema. Telemetry:  SR, no arrhythmias  Respiratory:  CTA bilaterally, no w/r/r. Normal respiratory effort. Abdomen:  Soft, tender to palpation of the right upper quadrant, nondistended, normoactive bowel sounds. Skin:  no rash or induration seen on limited exam Musculoskeletal:  grossly normal tone BUE/BLE Psychiatric:  grossly normal mood and affect, speech fluent and appropriate Neurologic:  grossly non-focal.          Labs on Admission:  Basic Metabolic Panel:  Recent Labs Lab 09/10/14 1646 09/15/14 1200  NA 136 133*  K 5.1 3.7  CL 98 96*  CO2 27 25  GLUCOSE 90 143*  BUN 11  19  CREATININE 0.57 1.03*  CALCIUM 8.7 8.8*   Liver Function Tests:  Recent Labs Lab 09/10/14 1646 09/15/14 1200  AST 18 23  ALT 11 17  ALKPHOS 71 67  BILITOT 0.3 0.7  PROT 6.4 7.1  ALBUMIN 3.4* 3.8    Recent Labs Lab 09/15/14 1200 09/15/14 1210  LIPASE  --  19*  AMYLASE 51  --    No results for input(s): AMMONIA in the last 168 hours. CBC:  Recent Labs Lab 09/10/14 1646 09/15/14 1200  WBC 6.4 7.1  NEUTROABS  --  5.8  HGB 10.2* 11.1*  HCT 31.2* 33.4*  MCV 82.8 81.7  PLT 420* 441*   Cardiac Enzymes: No results for input(s): CKTOTAL, CKMB, CKMBINDEX, TROPONINI in the last 168 hours.  BNP (last 3 results) No results for input(s): BNP in the last 8760 hours.  ProBNP (last 3 results) No results for input(s): PROBNP in the last 8760 hours.  CBG: No results for input(s): GLUCAP in the last 168 hours.  Radiological Exams on Admission: Ct Abdomen Pelvis W Contrast  09/15/2014   CLINICAL DATA:  79 year old female with abdominal pain for a few days. Nausea, vomiting and diarrhea. Prior abdominal hysterectomy and cholecystectomy. Initial encounter.  EXAM: CT ABDOMEN AND PELVIS WITH CONTRAST  TECHNIQUE: Multidetector CT imaging of the abdomen and pelvis was performed using the standard protocol following bolus administration of intravenous contrast.  CONTRAST:   140mL OMNIPAQUE IOHEXOL 300 MG/ML  SOLN  COMPARISON:  None.  FINDINGS: Prominent atherosclerotic type changes of the aorta and aortic branch vessels including the iliac artery and femoral arteries. These vessels are narrowed. Long segment high-grade stenosis of the proximal superior mesenteric artery.  Prominent stool rectosigmoid region. No surrounding inflammation as may be seen with stercoral colitis.  Prominent sigmoid diverticula without extra luminal bowel inflammatory process, free fluid or free air.  Post cholecystectomy with mild prominence of the common bile duct felt to be related to the age and post cholecystectomy state. No worrisome hepatic, splenic, pancreatic, renal or adrenal lesion. Left adrenal hyperplasia noted.  Noncontrast filled views of the urinary bladder unremarkable.  Soft tissue stranding inferior to the ischium greater on the right may be related to pressure phenomena without discrete skin ulcer.  Lung bases clear.  Prominent coronary artery calcifications.  Markedly abnormal appearance of the L1-2 disc space. The L1 vertebral body is collapsed into the L2 vertebral body with posterior projection of the posterior inferior aspect of the L1 vertebral body contributing to significant spinal stenosis. This is noted on prior plain film exam of 10/06/2013.  IMPRESSION: Prominent stool rectosigmoid region. No surrounding inflammation as may be seen with stercoral colitis.  Prominent sigmoid diverticula without extra luminal bowel inflammatory process, free fluid or free air.  Diffuse atherosclerotic type changes with coronary artery calcifications, narrowing of the ectatic abdominal aorta, long segment HIGH-GRADE STENOSIS proximal superior mesenteric artery, narrowing of the iliac arteries and femoral arteries.  Markedly abnormal appearance of the L1-2 disc space. The L1 vertebral body is collapsed into the L2 vertebral body with posterior projection of the posterior inferior aspect of the L1  vertebral body contributing to significant spinal stenosis. This is noted on prior plain film exam of 10/06/2013.  Please see above.   Electronically Signed   By: Genia Del M.D.   On: 09/15/2014 15:53     Assessment/Plan Active Problems:   Tobacco use disorder   SMA stenosis   Abdominal pain, right upper quadrant   Essential  hypertension   Depression   Hypothyroidism   GERD without esophagitis    Right upper quadrant abdominal pain: CT showing high-grade stenosis of the proximal superior mesenteric artery. Per consultation right ED physician and Dr. Laural Golden, he would like patient admitted for further workup. No other overt sign of etiology for patient's right upper quadrant pain such as cholecystitis, pancreatitis, or other intra-abdominal process. - MedSurg - Clear liquid diet and nothing by mouth after midnight - Follow-up Dr.Rehmans recommendations with possible vascular consultation - IVF this patient has had very little to eat over the last several days  HTN: Elevation due to patient's abdominal pain andpatient not taking medication this morning.  - continue patient's diltiazem - Hydralazine when necessary SBP greater than 811 and diastolic blood pressure greater than 100  GERD: - contineu PPI  Hypothyroid: - continue synthroid  Depression: - contineu cymbalta  Tobacco: 1/2ppd - nicotine  Code Status: FULL DVT Prophylaxis: Hep Family Communication: SOn Disposition Plan: Pending workup  Catina Nuss Lenna Sciara, MD Family Medicine [facility] [email or web address] Password [email or web address]

## 2014-09-15 NOTE — ED Notes (Signed)
Called [facility] to take Pt to [facility] for CT.  Spoke with Marden Noble, who will send a truck.

## 2014-09-15 NOTE — Telephone Encounter (Signed)
[name] at 9:46 am asking for [name] to please return her call at [phone].  Call returned by [name] at 10:52 am. Patient was not home and left a message with a lady to please let her know we was returning her call.

## 2014-09-16 ENCOUNTER — Observation Stay (HOSPITAL_COMMUNITY): Payer: Commercial Managed Care - HMO

## 2014-09-16 DIAGNOSIS — R109 Unspecified abdominal pain: Secondary | ICD-10-CM

## 2014-09-16 DIAGNOSIS — K551 Chronic vascular disorders of intestine: Secondary | ICD-10-CM | POA: Diagnosis present

## 2014-09-16 DIAGNOSIS — I700 Atherosclerosis of aorta: Secondary | ICD-10-CM | POA: Diagnosis not present

## 2014-09-16 DIAGNOSIS — E039 Hypothyroidism, unspecified: Secondary | ICD-10-CM | POA: Diagnosis present

## 2014-09-16 DIAGNOSIS — R1011 Right upper quadrant pain: Secondary | ICD-10-CM | POA: Diagnosis present

## 2014-09-16 DIAGNOSIS — M797 Fibromyalgia: Secondary | ICD-10-CM | POA: Diagnosis present

## 2014-09-16 DIAGNOSIS — R1010 Upper abdominal pain, unspecified: Secondary | ICD-10-CM | POA: Diagnosis not present

## 2014-09-16 DIAGNOSIS — R1084 Generalized abdominal pain: Secondary | ICD-10-CM | POA: Diagnosis present

## 2014-09-16 DIAGNOSIS — K219 Gastro-esophageal reflux disease without esophagitis: Secondary | ICD-10-CM | POA: Diagnosis present

## 2014-09-16 DIAGNOSIS — I10 Essential (primary) hypertension: Secondary | ICD-10-CM | POA: Diagnosis present

## 2014-09-16 DIAGNOSIS — J449 Chronic obstructive pulmonary disease, unspecified: Secondary | ICD-10-CM | POA: Diagnosis present

## 2014-09-16 DIAGNOSIS — Z7982 Long term (current) use of aspirin: Secondary | ICD-10-CM | POA: Diagnosis not present

## 2014-09-16 DIAGNOSIS — Z96659 Presence of unspecified artificial knee joint: Secondary | ICD-10-CM | POA: Diagnosis present

## 2014-09-16 DIAGNOSIS — G8929 Other chronic pain: Secondary | ICD-10-CM | POA: Diagnosis present

## 2014-09-16 DIAGNOSIS — F17210 Nicotine dependence, cigarettes, uncomplicated: Secondary | ICD-10-CM | POA: Diagnosis present

## 2014-09-16 DIAGNOSIS — I771 Stricture of artery: Secondary | ICD-10-CM | POA: Diagnosis not present

## 2014-09-16 DIAGNOSIS — D509 Iron deficiency anemia, unspecified: Secondary | ICD-10-CM | POA: Diagnosis present

## 2014-09-16 DIAGNOSIS — F329 Major depressive disorder, single episode, unspecified: Secondary | ICD-10-CM | POA: Diagnosis present

## 2014-09-16 DIAGNOSIS — R110 Nausea: Secondary | ICD-10-CM | POA: Diagnosis not present

## 2014-09-16 LAB — COMPREHENSIVE METABOLIC PANEL
ALK PHOS: 64 U/L (ref 38–126)
ALT: 15 U/L (ref 14–54)
ANION GAP: 8 (ref 5–15)
AST: 21 U/L (ref 15–41)
Albumin: 3.3 g/dL — ABNORMAL LOW (ref 3.5–5.0)
BUN: 12 mg/dL (ref 6–20)
CO2: 26 mmol/L (ref 22–32)
Calcium: 7.9 mg/dL — ABNORMAL LOW (ref 8.9–10.3)
Chloride: 98 mmol/L — ABNORMAL LOW (ref 101–111)
Creatinine, Ser: 0.58 mg/dL (ref 0.44–1.00)
GFR calc Af Amer: >60 mL/min (ref >60)
GFR calc non Af Amer: >60 mL/min (ref >60)
Glucose, Bld: 110 mg/dL — ABNORMAL HIGH (ref 65–99)
Potassium: 3.6 mmol/L (ref 3.5–5.1)
SODIUM: 132 mmol/L — AB (ref 135–145)
Total Bilirubin: 0.6 mg/dL (ref 0.3–1.2)
Total Protein: 5.9 g/dL — ABNORMAL LOW (ref 6.5–8.1)

## 2014-09-16 LAB — CBC
HCT: 29.1 % — ABNORMAL LOW (ref 36.0–46.0)
Hemoglobin: 9.8 g/dL — ABNORMAL LOW (ref 12.0–15.0)
MCH: 27.6 pg (ref 26.0–34.0)
MCHC: 33.7 g/dL (ref 30.0–36.0)
MCV: 82 fL (ref 78.0–100.0)
Platelets: 405 10*3/uL — ABNORMAL HIGH (ref 150–400)
RBC: 3.55 MIL/uL — AB (ref 3.87–5.11)
RDW: 14.4 % (ref 11.5–15.5)
WBC: 13.9 10*3/uL — ABNORMAL HIGH (ref 4.0–10.5)

## 2014-09-16 LAB — PROTIME-INR
INR: 1.12 (ref 0.00–1.49)
Prothrombin Time: 14.6 seconds (ref 11.6–15.2)

## 2014-09-16 LAB — APTT: aPTT: 27 seconds (ref 24–37)

## 2014-09-16 MED ORDER — IOHEXOL 350 MG/ML SOLN
100.0000 mL | Freq: Once | INTRAVENOUS | Status: AC | PRN
  Administered 2014-09-16: 100 mL via INTRAVENOUS

## 2014-09-16 MED ORDER — SODIUM CHLORIDE 0.9 % IJ SOLN
INTRAMUSCULAR | Status: AC
  Filled 2014-09-16: qty 500

## 2014-09-16 MED ORDER — SODIUM CHLORIDE 0.9 % IJ SOLN
INTRAMUSCULAR | Status: AC
  Filled 2014-09-16: qty 60

## 2014-09-16 MED ORDER — SODIUM CHLORIDE 0.9 % IV SOLN
510.0000 mg | Freq: Once | INTRAVENOUS | Status: AC
  Administered 2014-09-16: 510 mg via INTRAVENOUS
  Filled 2014-09-16: qty 17

## 2014-09-16 MED ORDER — ISOSORBIDE MONONITRATE ER 30 MG PO TB24
15.0000 mg | ORAL_TABLET | Freq: Every day | ORAL | Status: DC
  Administered 2014-09-16 – 2014-09-17 (×2): 15 mg via ORAL
  Filled 2014-09-16 (×2): qty 1

## 2014-09-16 MED ORDER — METOCLOPRAMIDE HCL 5 MG/ML IJ SOLN
5.0000 mg | Freq: Three times a day (TID) | INTRAMUSCULAR | Status: DC
  Administered 2014-09-16 – 2014-09-17 (×4): 5 mg via INTRAVENOUS
  Filled 2014-09-16 (×4): qty 2

## 2014-09-16 NOTE — Progress Notes (Signed)
Subjective: She is here because of mostly postprandial abdominal pain. She had CT angiogram done and it shows that she has stenosis in the superior mesenteric artery. She is being scheduled for angiogram today in [place]. She says she still having some abdominal discomfort.  Objective: Vital signs in last 24 hours: Temp:  [97.4 F (36.3 C)-100.4 F (38 C)] 100.4 F (38 C) (07/27 0444) Pulse Rate:  [63-119] 119 (07/27 0444) Resp:  [12-21] 18 (07/27 0444) BP: (120-222)/(34-91) 138/34 mmHg (07/27 0444) SpO2:  [90 %-99 %] 90 % (07/27 0444) Weight:  [58.06 kg (128 lb)] 58.06 kg (128 lb) (07/26 2015) Weight change:  Last BM Date: 09/15/14  Intake/Output from previous day: 07/26 0701 - 07/27 0700 In: -  Out: 100 [Urine:100]  PHYSICAL EXAM General appearance: alert, cooperative and mild distress Resp: clear to auscultation bilaterally Cardio: regular rate and rhythm, S1, S2 normal, no murmur, click, rub or gallop GI: She has mild abdominal tenderness which is diffuse Extremities: extremities normal, atraumatic, no cyanosis or edema  Lab Results:  Results for orders placed or performed during the hospital encounter of 09/15/14 (from the past 48 hour(s))  Amylase     Status: None   Collection Time: 09/15/14 12:00 PM  Result Value Ref Range   Amylase 51 28 - 100 U/L  CBC WITH DIFFERENTIAL     Status: Abnormal   Collection Time: 09/15/14 12:00 PM  Result Value Ref Range   WBC 7.1 4.0 - 10.5 K/uL   RBC 4.09 3.87 - 5.11 MIL/uL   Hemoglobin 11.1 (L) 12.0 - 15.0 g/dL   HCT 33.4 (L) 36.0 - 46.0 %   MCV 81.7 78.0 - 100.0 fL   MCH 27.1 26.0 - 34.0 pg   MCHC 33.2 30.0 - 36.0 g/dL   RDW 14.6 11.5 - 15.5 %   Platelets 441 (H) 150 - 400 K/uL   Neutrophils Relative % 81 (H) 43 - 77 %   Neutro Abs 5.8 1.7 - 7.7 K/uL   Lymphocytes Relative 13 12 - 46 %   Lymphs Abs 0.9 0.7 - 4.0 K/uL   Monocytes Relative 6 3 - 12 %   Monocytes Absolute 0.4 0.1 - 1.0 K/uL   Eosinophils Relative 0 0 - 5  %   Eosinophils Absolute 0.0 0.0 - 0.7 K/uL   Basophils Relative 0 0 - 1 %   Basophils Absolute 0.0 0.0 - 0.1 K/uL  Comprehensive metabolic panel     Status: Abnormal   Collection Time: 09/15/14 12:00 PM  Result Value Ref Range   Sodium 133 (L) 135 - 145 mmol/L   Potassium 3.7 3.5 - 5.1 mmol/L   Chloride 96 (L) 101 - 111 mmol/L   CO2 25 22 - 32 mmol/L   Glucose, Bld 143 (H) 65 - 99 mg/dL   BUN 19 6 - 20 mg/dL   Creatinine, Ser 1.03 (H) 0.44 - 1.00 mg/dL   Calcium 8.8 (L) 8.9 - 10.3 mg/dL   Total Protein 7.1 6.5 - 8.1 g/dL   Albumin 3.8 3.5 - 5.0 g/dL   AST 23 15 - 41 U/L   ALT 17 14 - 54 U/L   Alkaline Phosphatase 67 38 - 126 U/L   Total Bilirubin 0.7 0.3 - 1.2 mg/dL   GFR calc non Af Amer 51 (L) >60 mL/min   GFR calc Af Amer 60 (L) >60 mL/min    Comment: (NOTE) The eGFR has been calculated using the CKD EPI equation. This calculation has not been  validated in all clinical situations. eGFR's persistently <60 mL/min signify possible Chronic Kidney Disease.    Anion gap 12 5 - 15  Lipase, blood     Status: Abnormal   Collection Time: 09/15/14 12:10 PM  Result Value Ref Range   Lipase 19 (L) 22 - 51 U/L  Lactic acid, plasma     Status: None   Collection Time: 09/15/14 12:29 PM  Result Value Ref Range   Lactic Acid, Venous 1.8 0.5 - 2.0 mmol/L  Urinalysis, Routine w reflex microscopic (not at [facility])     Status: Abnormal   Collection Time: 09/15/14  4:35 PM  Result Value Ref Range   Color, Urine YELLOW YELLOW   APPearance CLEAR CLEAR   Specific Gravity, Urine <1.005 (L) 1.005 - 1.030   pH 6.0 5.0 - 8.0   Glucose, UA NEGATIVE NEGATIVE mg/dL   Hgb urine dipstick NEGATIVE NEGATIVE   Bilirubin Urine NEGATIVE NEGATIVE   Ketones, ur TRACE (A) NEGATIVE mg/dL   Protein, ur NEGATIVE NEGATIVE mg/dL   Urobilinogen, UA 0.2 0.0 - 1.0 mg/dL   Nitrite NEGATIVE NEGATIVE   Leukocytes, UA NEGATIVE NEGATIVE    Comment: MICROSCOPIC NOT DONE ON URINES WITH NEGATIVE PROTEIN, BLOOD,  LEUKOCYTES, NITRITE, OR GLUCOSE <1000 mg/dL.  Lactic acid, plasma     Status: None   Collection Time: 09/15/14  4:52 PM  Result Value Ref Range   Lactic Acid, Venous 1.0 0.5 - 2.0 mmol/L  CBC     Status: Abnormal   Collection Time: 09/16/14  5:35 AM  Result Value Ref Range   WBC 13.9 (H) 4.0 - 10.5 K/uL   RBC 3.55 (L) 3.87 - 5.11 MIL/uL   Hemoglobin 9.8 (L) 12.0 - 15.0 g/dL   HCT 29.1 (L) 36.0 - 46.0 %   MCV 82.0 78.0 - 100.0 fL   MCH 27.6 26.0 - 34.0 pg   MCHC 33.7 30.0 - 36.0 g/dL   RDW 14.4 11.5 - 15.5 %   Platelets 405 (H) 150 - 400 K/uL  Comprehensive metabolic panel     Status: Abnormal   Collection Time: 09/16/14  5:35 AM  Result Value Ref Range   Sodium 132 (L) 135 - 145 mmol/L   Potassium 3.6 3.5 - 5.1 mmol/L   Chloride 98 (L) 101 - 111 mmol/L   CO2 26 22 - 32 mmol/L   Glucose, Bld 110 (H) 65 - 99 mg/dL   BUN 12 6 - 20 mg/dL   Creatinine, Ser 0.58 0.44 - 1.00 mg/dL   Calcium 7.9 (L) 8.9 - 10.3 mg/dL   Total Protein 5.9 (L) 6.5 - 8.1 g/dL   Albumin 3.3 (L) 3.5 - 5.0 g/dL   AST 21 15 - 41 U/L   ALT 15 14 - 54 U/L   Alkaline Phosphatase 64 38 - 126 U/L   Total Bilirubin 0.6 0.3 - 1.2 mg/dL   GFR calc non Af Amer >60 >60 mL/min   GFR calc Af Amer >60 >60 mL/min    Comment: (NOTE) The eGFR has been calculated using the CKD EPI equation. This calculation has not been validated in all clinical situations. eGFR's persistently <60 mL/min signify possible Chronic Kidney Disease.    Anion gap 8 5 - 15  Protime-INR     Status: None   Collection Time: 09/16/14  5:35 AM  Result Value Ref Range   Prothrombin Time 14.6 11.6 - 15.2 seconds   INR 1.12 0.00 - 1.49  APTT     Status: None  Collection Time: 09/16/14  5:35 AM  Result Value Ref Range   aPTT 27 24 - 37 seconds    ABGS No results for input(s): PHART, PO2ART, TCO2, HCO3 in the last 72 hours.  Invalid input(s): PCO2 CULTURES No results found for this or any previous visit (from the past 240  hour(s)). Studies/Results: Ct Abdomen Pelvis W Contrast  09/15/2014   CLINICAL DATA:  79 year old female with abdominal pain for a few days. Nausea, vomiting and diarrhea. Prior abdominal hysterectomy and cholecystectomy. Initial encounter.  EXAM: CT ABDOMEN AND PELVIS WITH CONTRAST  TECHNIQUE: Multidetector CT imaging of the abdomen and pelvis was performed using the standard protocol following bolus administration of intravenous contrast.  CONTRAST:  122mL OMNIPAQUE IOHEXOL 300 MG/ML  SOLN  COMPARISON:  None.  FINDINGS: Prominent atherosclerotic type changes of the aorta and aortic branch vessels including the iliac artery and femoral arteries. These vessels are narrowed. Long segment high-grade stenosis of the proximal superior mesenteric artery.  Prominent stool rectosigmoid region. No surrounding inflammation as may be seen with stercoral colitis.  Prominent sigmoid diverticula without extra luminal bowel inflammatory process, free fluid or free air.  Post cholecystectomy with mild prominence of the common bile duct felt to be related to the age and post cholecystectomy state. No worrisome hepatic, splenic, pancreatic, renal or adrenal lesion. Left adrenal hyperplasia noted.  Noncontrast filled views of the urinary bladder unremarkable.  Soft tissue stranding inferior to the ischium greater on the right may be related to pressure phenomena without discrete skin ulcer.  Lung bases clear.  Prominent coronary artery calcifications.  Markedly abnormal appearance of the L1-2 disc space. The L1 vertebral body is collapsed into the L2 vertebral body with posterior projection of the posterior inferior aspect of the L1 vertebral body contributing to significant spinal stenosis. This is noted on prior plain film exam of 10/06/2013.  IMPRESSION: Prominent stool rectosigmoid region. No surrounding inflammation as may be seen with stercoral colitis.  Prominent sigmoid diverticula without extra luminal bowel inflammatory  process, free fluid or free air.  Diffuse atherosclerotic type changes with coronary artery calcifications, narrowing of the ectatic abdominal aorta, long segment HIGH-GRADE STENOSIS proximal superior mesenteric artery, narrowing of the iliac arteries and femoral arteries.  Markedly abnormal appearance of the L1-2 disc space. The L1 vertebral body is collapsed into the L2 vertebral body with posterior projection of the posterior inferior aspect of the L1 vertebral body contributing to significant spinal stenosis. This is noted on prior plain film exam of 10/06/2013.  Please see above.   Electronically Signed   By: Genia Del M.D.   On: 09/15/2014 15:53    Medications:  Prior to Admission:  Prescriptions prior to admission  Medication Sig Dispense Refill Last Dose  . Ascorbic Acid (VITAMIN C) 1000 MG tablet Take 1,000 mg by mouth 2 (two) times daily.    09/14/2014 at Unknown time  . aspirin EC 81 MG tablet Take 81 mg by mouth every other day.    Past Week at Unknown time  . B Complex Vitamins (VITAMIN-B COMPLEX PO) Take 1,000 mcg by mouth daily.    09/14/2014 at Unknown time  . Biotin 1000 MCG tablet Take 500 mcg by mouth daily.    09/14/2014 at Unknown time  . CALCIUM-VITAMIN D PO Take 2 capsules by mouth daily. Calcium $RemoveBefore'1200mg'[id number]$  + Vitamin D (unknown strength)   09/14/2014 at Unknown time  . DILT-XR 120 MG 24 hr capsule Take 120 mg by mouth daily.  09/14/2014 at Unknown time  . DULoxetine (CYMBALTA) 60 MG capsule Take 60 mg by mouth daily.   09/14/2014 at Unknown time  . Flaxseed, Linseed, (FLAX SEED OIL) 1000 MG CAPS Take 1 capsule by mouth daily.    09/14/2014 at Unknown time  . levothyroxine (SYNTHROID, LEVOTHROID) 88 MCG tablet Take 1 tablet (88 mcg total) by mouth daily. 30 tablet 1 09/15/2014 at Unknown time  . Multiple Vitamin (MULTIVITAMIN WITH MINERALS) TABS Take 1 tablet by mouth daily.   09/14/2014 at Unknown time  . niacin 500 MG tablet Take 500 mg by mouth at bedtime.   09/14/2014 at Unknown  time  . ondansetron (ZOFRAN) 4 MG tablet Take 1 tablet (4 mg total) by mouth 2 (two) times daily. 20 tablet 0 09/15/2014 at Unknown time  . pantoprazole (PROTONIX) 40 MG tablet Take 40 mg by mouth daily.   09/14/2014 at Unknown time  . polyethylene glycol (MIRALAX / GLYCOLAX) packet Take 17 g by mouth every other day.   Past Week at Unknown time  . ibandronate (BONIVA) 150 MG tablet Take 150 mg by mouth every 30 (thirty) days.    08/2014   Scheduled: . sodium chloride   Intravenous STAT  . antiseptic oral rinse  7 mL Mouth Rinse BID  . aspirin EC  81 mg Oral QODAY  . diltiazem  120 mg Oral Daily  . DULoxetine  60 mg Oral Daily  . heparin  5,000 Units Subcutaneous 3 times per day  . levothyroxine  88 mcg Oral QAC breakfast  . niacin  500 mg Oral QHS  . nicotine  14 mg Transdermal Daily  . pantoprazole  40 mg Oral Daily  . sodium chloride      . sodium chloride       Continuous: . sodium chloride 100 mL/hr at 09/15/14 1243  . sodium chloride 100 mL/hr at 09/16/14 0532   QTM:[id number] **OR** acetaminophen, hydrALAZINE, ondansetron **OR** ondansetron (ZOFRAN) IV, oxyCODONE  AShe has superior mesenteric artery stenosis and abdominal pain from that. She is scheduled forActive Problems:   Tobacco use disorder   SMA stenosis   Abdominal pain, right upper quadrant   Essential hypertension   Depression   Hypothyroidism   GERD without esophagitis    Planarteriogram today      Earlena Werst L 09/16/2014, 9:03 AM

## 2014-09-16 NOTE — Progress Notes (Signed)
  Subjective:  Patient continues to complain of postprandial nausea and epigastric pain. She also has noted mild pain right lower quadrant. She also complains of intermittent claudication. She says she can walk 1 block before she gets pain in her legs.  Objective: Blood pressure 115/44, pulse 79, temperature 98.8 F (37.1 C), temperature source Oral, resp. rate 18, height 5\' 1"  (1.549 m), weight 128 lb (58.06 kg), SpO2 94 %. Patient is alert and in no acute distress Abdomen is soft with mild tenderness at epigastrium and right low quadrant. No organomegaly or masses.  Rectal examination performed. No mass palpated on digital exam. No LE edema or clubbing noted.  Labs/studies Results:   Recent Labs  09/15/14 1200 09/16/14 0535  WBC 7.1 13.9*  HGB 11.1* 9.8*  HCT 33.4* 29.1*  PLT 441* 405*    BMET   Recent Labs  09/15/14 1200 09/16/14 0535  NA 133* 132*  K 3.7 3.6  CL 96* 98*  CO2 25 26  GLUCOSE 143* 110*  BUN 19 12  CREATININE 1.03* 0.58  CALCIUM 8.8* 7.9*    LFT   Recent Labs  09/15/14 1200 09/16/14 0535  PROT 7.1 5.9*  ALBUMIN 3.8 3.3*  AST 23 21  ALT 17 15  ALKPHOS 67 64  BILITOT 0.7 0.6     IMPRESSION: 1. Severe aortoiliac atherosclerosis with diffuse calcified plaque present. There is roughly 50% focal stenosis of the aorta just inferior to the renal arteries and diffuse disease of bilateral iliac arteries, most significantly at the level of the right external iliac artery. No evidence of aortic aneurysm. 2. Significant mesenteric occlusive disease primarily involving the superior mesenteric artery. By CTA, the proximal 3 cm of the SMA trunk is likely chronically occluded with distal reconstitution by retrograde flow and pancreaticoduodenal and proximal jejunal branches. Trickle flow through the segment of occlusive disease cannot be excluded by CTA. Moderate range stenosis of the celiac origin is estimated to be approximately 40- 50%. The  inferior mesenteric artery is open. 3. Two separate right renal arteries and a single left renal artery all demonstrate moderate range origins stenoses in the 50- 60% range. 4. Inflammation and thickening involving the cecum and ascending colon. This may be secondary to ischemia. Mass lesion cannot be excluded. A rectal mass involving the posterior and left wall of the distal rectum is suspected by CT. Correlation suggested with rectal exam. Consider colonoscopy correlation.  Electronically Signed: By: Aletta Edouard M.D. On: 09/16/2014 10:24  Assessment:  #1. Nausea vomiting and abdominal pain. Symptoms suspected due to ischemic injury to GI tract. Study reviewed with Dr. Aletta Edouard who feels there is no role for interventional radiology. Consultation with vascular surgery recommended which Dr. Luan Pulling is arranging with patient's surgeon Dr. Lucky Cowboy of [facility]. Patient begun on isosorbide mononitrite as as discussed with Dr. Luan Pulling. #2. Question of rectal mass on imaging studies. No mass appreciated on digital exam and recent colonoscopy was negative. This mass would appear to be pseudomass due to stool. #3. Iron deficiency anemia primarily secondary to chronic PPI therapy. She may not be able to tolerate by mouth iron given her GI symptoms. Therefore will give her single dose of Feraheme  Recommendations:  Patient must not smoke cigarettes when she goes home. Metoclopramide 5 mg IV before meals and daily at bedtime. Her heme 510 mg IV 1. Vascular surgery consultation on outpatient basis.

## 2014-09-16 NOTE — Progress Notes (Signed)
After my initial discussion this morning with Dr. Laural Golden it was thought that she might be able to go home and have further evaluation and treatment as an outpatient. She needs to see a vascular surgeon because the interventional radiologist thinks that she has more diffuse disease than can be approached with their stents. However she's having a lot of trouble with nausea and vomiting so I don't think she can be discharged at this point. She has seen Dr. Lucky Cowboy a vascular surgeon in the past and once she is better I will refer her back to him

## 2014-09-16 NOTE — Progress Notes (Signed)
Patient ID: [name], female   DOB: 1935-04-07, 79 y.o.   MRN: [id number] Admitted thru the ED yesterday with epigastric pain. Her pain started Friday.  She has pain after eating. Dr. Laural Golden has been in today and examined patient. Patient will need and abdominal angiography unless radiology wanta CTA. CT scan yesterday revealed Diffuse atherosclerotic type changes with coronary artery calcifications, narrowing of the ectatic abdominal aorta, long segment HIGH-GRADE STENOSIS proximal superior mesenteric artery, narrowing of the iliac arteries and femoral arteries.

## 2014-09-17 ENCOUNTER — Telehealth (INDEPENDENT_AMBULATORY_CARE_PROVIDER_SITE_OTHER): Payer: Self-pay | Admitting: *Deleted

## 2014-09-17 MED ORDER — ISOSORBIDE MONONITRATE ER 30 MG PO TB24: 15.0000 mg | ORAL_TABLET | Freq: Every day | ORAL | Status: DC

## 2014-09-17 MED ORDER — METOCLOPRAMIDE HCL 5 MG PO TABS: 5.0000 mg | ORAL_TABLET | Freq: Three times a day (TID) | ORAL | Status: DC

## 2014-09-17 MED ORDER — NICOTINE 14 MG/24HR TD PT24: 14.0000 mg | MEDICATED_PATCH | Freq: Every day | TRANSDERMAL | Status: DC

## 2014-09-17 NOTE — Telephone Encounter (Signed)
Vickey called from [facility] states   SMA stinting-that the patient is not an IR candidate . If her symptoms continue , then she may be referred to Vascular Surgeon. Any questions call [phone].

## 2014-09-17 NOTE — Progress Notes (Signed)
Subjective: She feels much better this morning and wants to go home. She is not complaining of nausea and pain now and wants to eat.  Objective: Vital signs in last 24 hours: Temp:  [98.5 F (36.9 C)-99.8 F (37.7 C)] 98.5 F (36.9 C) (07/28 0559) Pulse Rate:  [79-95] 95 (07/28 0559) Resp:  [18] 18 (07/28 0559) BP: (115-138)/(36-44) 115/44 mmHg (07/28 0559) SpO2:  [90 %-97 %] 90 % (07/28 0559) Weight change:  Last BM Date: 09/17/14  Intake/Output from previous day: 07/27 0701 - 07/28 0700 In: 2032 [P.O.:720; I.V.:1195; IV Piggyback:117] Out: -   PHYSICAL EXAM General appearance: alert, cooperative and mild distress Resp: clear to auscultation bilaterally Cardio: regular rate and rhythm, S1, S2 normal, no murmur, click, rub or gallop GI: soft, non-tender; bowel sounds normal; no masses,  no organomegaly Extremities: extremities normal, atraumatic, no cyanosis or edema  Lab Results:  Results for orders placed or performed during the hospital encounter of 09/15/14 (from the past 48 hour(s))  Amylase     Status: None   Collection Time: 09/15/14 12:00 PM  Result Value Ref Range   Amylase 51 28 - 100 U/L  CBC WITH DIFFERENTIAL     Status: Abnormal   Collection Time: 09/15/14 12:00 PM  Result Value Ref Range   WBC 7.1 4.0 - 10.5 K/uL   RBC 4.09 3.87 - 5.11 MIL/uL   Hemoglobin 11.1 (L) 12.0 - 15.0 g/dL   HCT 33.4 (L) 36.0 - 46.0 %   MCV 81.7 78.0 - 100.0 fL   MCH 27.1 26.0 - 34.0 pg   MCHC 33.2 30.0 - 36.0 g/dL   RDW 14.6 11.5 - 15.5 %   Platelets 441 (H) 150 - 400 K/uL   Neutrophils Relative % 81 (H) 43 - 77 %   Neutro Abs 5.8 1.7 - 7.7 K/uL   Lymphocytes Relative 13 12 - 46 %   Lymphs Abs 0.9 0.7 - 4.0 K/uL   Monocytes Relative 6 3 - 12 %   Monocytes Absolute 0.4 0.1 - 1.0 K/uL   Eosinophils Relative 0 0 - 5 %   Eosinophils Absolute 0.0 0.0 - 0.7 K/uL   Basophils Relative 0 0 - 1 %   Basophils Absolute 0.0 0.0 - 0.1 K/uL  Comprehensive metabolic panel     Status:  Abnormal   Collection Time: 09/15/14 12:00 PM  Result Value Ref Range   Sodium 133 (L) 135 - 145 mmol/L   Potassium 3.7 3.5 - 5.1 mmol/L   Chloride 96 (L) 101 - 111 mmol/L   CO2 25 22 - 32 mmol/L   Glucose, Bld 143 (H) 65 - 99 mg/dL   BUN 19 6 - 20 mg/dL   Creatinine, Ser 1.03 (H) 0.44 - 1.00 mg/dL   Calcium 8.8 (L) 8.9 - 10.3 mg/dL   Total Protein 7.1 6.5 - 8.1 g/dL   Albumin 3.8 3.5 - 5.0 g/dL   AST 23 15 - 41 U/L   ALT 17 14 - 54 U/L   Alkaline Phosphatase 67 38 - 126 U/L   Total Bilirubin 0.7 0.3 - 1.2 mg/dL   GFR calc non Af Amer 51 (L) >60 mL/min   GFR calc Af Amer 60 (L) >60 mL/min    Comment: (NOTE) The eGFR has been calculated using the CKD EPI equation. This calculation has not been validated in all clinical situations. eGFR's persistently <60 mL/min signify possible Chronic Kidney Disease.    Anion gap 12 5 - 15  Lipase, blood  Status: Abnormal   Collection Time: 09/15/14 12:10 PM  Result Value Ref Range   Lipase 19 (L) 22 - 51 U/L  Lactic acid, plasma     Status: None   Collection Time: 09/15/14 12:29 PM  Result Value Ref Range   Lactic Acid, Venous 1.8 0.5 - 2.0 mmol/L  Urinalysis, Routine w reflex microscopic (not at [facility])     Status: Abnormal   Collection Time: 09/15/14  4:35 PM  Result Value Ref Range   Color, Urine YELLOW YELLOW   APPearance CLEAR CLEAR   Specific Gravity, Urine <1.005 (L) 1.005 - 1.030   pH 6.0 5.0 - 8.0   Glucose, UA NEGATIVE NEGATIVE mg/dL   Hgb urine dipstick NEGATIVE NEGATIVE   Bilirubin Urine NEGATIVE NEGATIVE   Ketones, ur TRACE (A) NEGATIVE mg/dL   Protein, ur NEGATIVE NEGATIVE mg/dL   Urobilinogen, UA 0.2 0.0 - 1.0 mg/dL   Nitrite NEGATIVE NEGATIVE   Leukocytes, UA NEGATIVE NEGATIVE    Comment: MICROSCOPIC NOT DONE ON URINES WITH NEGATIVE PROTEIN, BLOOD, LEUKOCYTES, NITRITE, OR GLUCOSE <1000 mg/dL.  Lactic acid, plasma     Status: None   Collection Time: 09/15/14  4:52 PM  Result Value Ref Range   Lactic Acid, Venous  1.0 0.5 - 2.0 mmol/L  CBC     Status: Abnormal   Collection Time: 09/16/14  5:35 AM  Result Value Ref Range   WBC 13.9 (H) 4.0 - 10.5 K/uL   RBC 3.55 (L) 3.87 - 5.11 MIL/uL   Hemoglobin 9.8 (L) 12.0 - 15.0 g/dL   HCT 29.1 (L) 36.0 - 46.0 %   MCV 82.0 78.0 - 100.0 fL   MCH 27.6 26.0 - 34.0 pg   MCHC 33.7 30.0 - 36.0 g/dL   RDW 14.4 11.5 - 15.5 %   Platelets 405 (H) 150 - 400 K/uL  Comprehensive metabolic panel     Status: Abnormal   Collection Time: 09/16/14  5:35 AM  Result Value Ref Range   Sodium 132 (L) 135 - 145 mmol/L   Potassium 3.6 3.5 - 5.1 mmol/L   Chloride 98 (L) 101 - 111 mmol/L   CO2 26 22 - 32 mmol/L   Glucose, Bld 110 (H) 65 - 99 mg/dL   BUN 12 6 - 20 mg/dL   Creatinine, Ser 0.58 0.44 - 1.00 mg/dL   Calcium 7.9 (L) 8.9 - 10.3 mg/dL   Total Protein 5.9 (L) 6.5 - 8.1 g/dL   Albumin 3.3 (L) 3.5 - 5.0 g/dL   AST 21 15 - 41 U/L   ALT 15 14 - 54 U/L   Alkaline Phosphatase 64 38 - 126 U/L   Total Bilirubin 0.6 0.3 - 1.2 mg/dL   GFR calc non Af Amer >60 >60 mL/min   GFR calc Af Amer >60 >60 mL/min    Comment: (NOTE) The eGFR has been calculated using the CKD EPI equation. This calculation has not been validated in all clinical situations. eGFR's persistently <60 mL/min signify possible Chronic Kidney Disease.    Anion gap 8 5 - 15  Protime-INR     Status: None   Collection Time: 09/16/14  5:35 AM  Result Value Ref Range   Prothrombin Time 14.6 11.6 - 15.2 seconds   INR 1.12 0.00 - 1.49  APTT     Status: None   Collection Time: 09/16/14  5:35 AM  Result Value Ref Range   aPTT 27 24 - 37 seconds    ABGS No results for input(s): PHART,  PO2ART, TCO2, HCO3 in the last 72 hours.  Invalid input(s): PCO2 CULTURES No results found for this or any previous visit (from the past 240 hour(s)). Studies/Results: Ct Abdomen Pelvis W Contrast  09/15/2014   CLINICAL DATA:  79 year old female with abdominal pain for a few days. Nausea, vomiting and diarrhea. Prior  abdominal hysterectomy and cholecystectomy. Initial encounter.  EXAM: CT ABDOMEN AND PELVIS WITH CONTRAST  TECHNIQUE: Multidetector CT imaging of the abdomen and pelvis was performed using the standard protocol following bolus administration of intravenous contrast.  CONTRAST:  129mL OMNIPAQUE IOHEXOL 300 MG/ML  SOLN  COMPARISON:  None.  FINDINGS: Prominent atherosclerotic type changes of the aorta and aortic branch vessels including the iliac artery and femoral arteries. These vessels are narrowed. Long segment high-grade stenosis of the proximal superior mesenteric artery.  Prominent stool rectosigmoid region. No surrounding inflammation as may be seen with stercoral colitis.  Prominent sigmoid diverticula without extra luminal bowel inflammatory process, free fluid or free air.  Post cholecystectomy with mild prominence of the common bile duct felt to be related to the age and post cholecystectomy state. No worrisome hepatic, splenic, pancreatic, renal or adrenal lesion. Left adrenal hyperplasia noted.  Noncontrast filled views of the urinary bladder unremarkable.  Soft tissue stranding inferior to the ischium greater on the right may be related to pressure phenomena without discrete skin ulcer.  Lung bases clear.  Prominent coronary artery calcifications.  Markedly abnormal appearance of the L1-2 disc space. The L1 vertebral body is collapsed into the L2 vertebral body with posterior projection of the posterior inferior aspect of the L1 vertebral body contributing to significant spinal stenosis. This is noted on prior plain film exam of 10/06/2013.  IMPRESSION: Prominent stool rectosigmoid region. No surrounding inflammation as may be seen with stercoral colitis.  Prominent sigmoid diverticula without extra luminal bowel inflammatory process, free fluid or free air.  Diffuse atherosclerotic type changes with coronary artery calcifications, narrowing of the ectatic abdominal aorta, long segment HIGH-GRADE  STENOSIS proximal superior mesenteric artery, narrowing of the iliac arteries and femoral arteries.  Markedly abnormal appearance of the L1-2 disc space. The L1 vertebral body is collapsed into the L2 vertebral body with posterior projection of the posterior inferior aspect of the L1 vertebral body contributing to significant spinal stenosis. This is noted on prior plain film exam of 10/06/2013.  Please see above.   Electronically Signed   By: Genia Del M.D.   On: 09/15/2014 15:53   Ct Angio Abd/pel W/ And/or W/o  09/16/2014   ADDENDUM REPORT: 09/16/2014 10:29  ADDENDUM: Findings were directly communicated to Dr. Laural Golden by phone. The patient has just had a colonoscopy and does not have evidence of a mass in the rectum or mass involving the proximal colon. Findings therefore likely relate to inflammation/ischemia of the proximal colon. The rectal findings may relate to adherent stool.   Electronically Signed   By: Aletta Edouard M.D.   On: 09/16/2014 10:29   09/16/2014   CLINICAL DATA:  Abdominal pain with nausea and vomiting.  EXAM: CTA ABDOMEN AND PELVIS WITHOUT CONTRAST  TECHNIQUE: Multidetector CT imaging of the abdomen and pelvis was performed using the standard protocol during bolus administration of intravenous contrast. Multiplanar reconstructed images and MIPs were obtained and reviewed to evaluate the vascular anatomy.  CONTRAST:  164mL OMNIPAQUE IOHEXOL 350 MG/ML SOLN  COMPARISON:  CT of the abdomen and pelvis on 09/15/2014  FINDINGS: The abdominal aorta demonstrates severe atherosclerosis with a large amount of heavily  calcified plaque present. Focal plaque just inferior to the renal arteries causes approximately 50% stenosis of the aortic lumen. The aorta is tortuous and shows no evidence of aneurysmal disease.  Heavily calcified plaque at the origin of the celiac axis causes approximately 40- 50% stenosis. Distal branches are patent with the splenic artery showing diffuse calcification. No  splenic artery aneurysm identified.  There is significant disease involving a long segment of the proximal superior mesenteric artery. Heavily calcified plaque at the origin causes occlusion of the proximal trunk with occlusive disease extending for approximately 3 cm beyond the origin. There is reconstitution of the distal trunk by probable retrograde flow and proximal jejunal and pancreaticoduodenal branches. It is felt that the proximal SMA is likely completely occluded. However, some trickle flow cannot be excluded by CTA. The reconstituted distal trunk of the SMA shows diffuse calcified plaque and is small in caliber.  The inferior mesenteric artery is open. There are 2 separate right renal arteries with the superior renal artery of smaller caliber an supplying the posterior right kidney. This artery demonstrates stenosis in the range of 50- 60% at its origin. A larger inferior right renal artery shows roughly 50% origin stenosis by calcified plaque. A single left renal artery demonstrates roughly 50- 60% stenosis at its origin by calcified plaque.  Diffuse iliac disease is noted bilaterally with heavily calcified plaque throughout the common, external and internal iliac arteries. This causes diffuse stenosis which is most significant at the level of the proximal to mid right external iliac artery with narrowing likely approaching 70% in maximal caliber. No iliac artery aneurysms are identified. The common femoral arteries demonstrate calcified plaque without high-grade stenosis. Both femoral bifurcations are open.  Nonvascular evaluation shows inflammation and thickening involving the cecum and ascending colon. This is quite prominent and may be secondary to ischemia. A component of mass lesion cannot be excluded by CT. There also is asymmetric thickening involving the posterior and left wall of the distal rectum over a roughly 5 cm segment. Polypoid soft tissue extends into the rectal lumen at this level and  findings are highly suspicious for a rectal mass lesion. There is no evidence of small bowel obstruction, free air or free fluid. Diffuse diverticulosis of the sigmoid colon is noted.  The liver, pancreas, spleen, adrenal glands and kidneys are unremarkable. No enlarged lymph nodes are seen. The bladder appears normal. No hernias are seen. The lumbar spine demonstrates advanced diffuse spondylosis with loss of height of the L1 vertebral body and associated scoliosis.  Review of the MIP images confirms the above findings.  IMPRESSION: 1. Severe aortoiliac atherosclerosis with diffuse calcified plaque present. There is roughly 50% focal stenosis of the aorta just inferior to the renal arteries and diffuse disease of bilateral iliac arteries, most significantly at the level of the right external iliac artery. No evidence of aortic aneurysm. 2. Significant mesenteric occlusive disease primarily involving the superior mesenteric artery. By CTA, the proximal 3 cm of the SMA trunk is likely chronically occluded with distal reconstitution by retrograde flow and pancreaticoduodenal and proximal jejunal branches. Trickle flow through the segment of occlusive disease cannot be excluded by CTA. Moderate range stenosis of the celiac origin is estimated to be approximately 40- 50%. The inferior mesenteric artery is open. 3. Two separate right renal arteries and a single left renal artery all demonstrate moderate range origins stenoses in the 50- 60% range. 4. Inflammation and thickening involving the cecum and ascending colon. This may be secondary  to ischemia. Mass lesion cannot be excluded. A rectal mass involving the posterior and left wall of the distal rectum is suspected by CT. Correlation suggested with rectal exam. Consider colonoscopy correlation.  Electronically Signed: By: Aletta Edouard M.D. On: 09/16/2014 10:24    Medications:  Prior to Admission:  Prescriptions prior to admission  Medication Sig Dispense  Refill Last Dose  . Ascorbic Acid (VITAMIN C) 1000 MG tablet Take 1,000 mg by mouth 2 (two) times daily.    09/14/2014 at Unknown time  . aspirin EC 81 MG tablet Take 81 mg by mouth every other day.    Past Week at Unknown time  . B Complex Vitamins (VITAMIN-B COMPLEX PO) Take 1,000 mcg by mouth daily.    09/14/2014 at Unknown time  . Biotin 1000 MCG tablet Take 500 mcg by mouth daily.    09/14/2014 at Unknown time  . CALCIUM-VITAMIN D PO Take 2 capsules by mouth daily. Calcium $RemoveBefore'1200mg'[id number]$  + Vitamin D (unknown strength)   09/14/2014 at Unknown time  . DILT-XR 120 MG 24 hr capsule Take 120 mg by mouth daily.    09/14/2014 at Unknown time  . DULoxetine (CYMBALTA) 60 MG capsule Take 60 mg by mouth daily.   09/14/2014 at Unknown time  . Flaxseed, Linseed, (FLAX SEED OIL) 1000 MG CAPS Take 1 capsule by mouth daily.    09/14/2014 at Unknown time  . levothyroxine (SYNTHROID, LEVOTHROID) 88 MCG tablet Take 1 tablet (88 mcg total) by mouth daily. 30 tablet 1 09/15/2014 at Unknown time  . Multiple Vitamin (MULTIVITAMIN WITH MINERALS) TABS Take 1 tablet by mouth daily.   09/14/2014 at Unknown time  . niacin 500 MG tablet Take 500 mg by mouth at bedtime.   09/14/2014 at Unknown time  . ondansetron (ZOFRAN) 4 MG tablet Take 1 tablet (4 mg total) by mouth 2 (two) times daily. 20 tablet 0 09/15/2014 at Unknown time  . pantoprazole (PROTONIX) 40 MG tablet Take 40 mg by mouth daily.   09/14/2014 at Unknown time  . polyethylene glycol (MIRALAX / GLYCOLAX) packet Take 17 g by mouth every other day.   Past Week at Unknown time  . ibandronate (BONIVA) 150 MG tablet Take 150 mg by mouth every 30 (thirty) days.    08/2014   Scheduled: . antiseptic oral rinse  7 mL Mouth Rinse BID  . aspirin EC  81 mg Oral QODAY  . diltiazem  120 mg Oral Daily  . DULoxetine  60 mg Oral Daily  . heparin  5,000 Units Subcutaneous 3 times per day  . isosorbide mononitrate  15 mg Oral Daily  . levothyroxine  88 mcg Oral QAC breakfast  . metoCLOPramide  (REGLAN) injection  5 mg Intravenous TID AC & HS  . niacin  500 mg Oral QHS  . nicotine  14 mg Transdermal Daily  . pantoprazole  40 mg Oral Daily   Continuous: . sodium chloride 100 mL/hr at 09/15/14 1243  . sodium chloride 100 mL/hr at 09/16/14 2134   AOZ:HYQMVHQIONGEX **OR** acetaminophen, hydrALAZINE, ondansetron **OR** ondansetron (ZOFRAN) IV, oxyCODONE  Assesment: She was admitted with abdominal pain presumably secondary to superior mesenteric artery stenosis. She is not a candidate for interventional radiology to do anything with this. She is doing much better as far as her nausea is concerned and she says she wants to go home. Active Problems:   Tobacco use disorder   SMA stenosis   Abdominal pain, right upper quadrant   Essential hypertension   Depression  Hypothyroidism   GERD without esophagitis   Abdominal pain    Plan: She is going to have clear liquids to start then advance her diet at lunch and if she does okay she may be able to be discharged. She already has contact with a vascular surgeon and I would like her to follow up with him    LOS: 1 day   [name] L 09/17/2014, 9:34 AM

## 2014-09-17 NOTE — Progress Notes (Signed)
  Subjective:  Patient feels much better. She is not experiencing any side effects with metoclopramide. She ate her breakfast without any difficulty. She now has mild pain in right low quadrant. She denies melena or rectal bleeding.   Objective: Blood pressure 115/44, pulse 95, temperature 98.5 F (36.9 C), temperature source Oral, resp. rate 18, height 5\' 1"  (1.549 m), weight 128 lb (58.06 kg), SpO2 90 %. Patient is alert and in no acute distress. Abdomen is soft with mild tenderness at RLQ.  Labs/studies Results:   Recent Labs  09/15/14 1200 09/16/14 0535  WBC 7.1 13.9*  HGB 11.1* 9.8*  HCT 33.4* 29.1*  PLT 441* 405*    BMET   Recent Labs  09/15/14 1200 09/16/14 0535  NA 133* 132*  K 3.7 3.6  CL 96* 98*  CO2 25 26  GLUCOSE 143* 110*  BUN 19 12  CREATININE 1.03* 0.58  CALCIUM 8.8* 7.9*    LFT   Recent Labs  09/15/14 1200 09/16/14 0535  PROT 7.1 5.9*  ALBUMIN 3.8 3.3*  AST 23 21  ALT 17 15  ALKPHOS 67 64  BILITOT 0.7 0.6    PT/INR   Recent Labs  09/16/14 0535  LABPROT 14.6  INR 1.12    Hepatitis Panel  No results for input(s): HEPBSAG, HCVAB, HEPAIGM, HEPBIGM in the last 72 hours.   Assessment:  #1. Intestinal ischemia. She does have CT evidence of typhlitis or cecal wall inflammation. She was begun on isosorbide mononitrate yesterday. CT angiogram yesterday showed extensive atherosclerotic changes and occlusion to SMA. Her disease is not amenable to intervention radiology. Patient has an appointment to see her vascular surgeon. #2. Nausea vomiting and burping. She seemed to have responded to low-dose Reglan. #3. Iron deficiency anemia is likely secondary to impaired iron absorption and setting her PPI. She had EGD and colonoscopy about 2 months ago. If there is evidence of overt GI bleed will consider small bowel given capsule study.    Recommendations:  Continue metoclopramide at 5 mg by mouth before meals. Patient instructed to eat 6 small  meals rather than 3 large meals. He should also advised to take polyethylene glycol every day rather than when necessary basis. Plan to see patient after she's been evaluated by vascular surgeon Dr. Lucky Cowboy. Patient must not smoke cigarettes anymore. Will check hemoglobin at the time of office visit.

## 2014-09-17 NOTE — Telephone Encounter (Signed)
It has already been addressed. I talk with Dr. Aletta Edouard yesterday.

## 2014-09-17 NOTE — Progress Notes (Signed)
Removed pt IV and telemetry, tolerated well.  Reviewed discharge instructions with pt and son.  Answered all questions at this time.

## 2014-09-17 NOTE — Care Management Note (Addendum)
Case Management Note  Patient Details  Name: [name] MRN: [id number] Date of Birth: [birth year]  Expected Discharge Date:  09/18/14               Expected Discharge Plan:  Home/Self Care  In-House Referral:  NA  Discharge planning Services  CM Consult  Post Acute Care Choice:  NA Choice offered to:  NA  DME Arranged:    DME Agency:     HH Arranged:    [place] Agency:     Status of Service:  Completed, signed off  Medicare Important Message Given:    Date Medicare IM Given:    Medicare IM give by:    Date Additional Medicare IM Given:    Additional Medicare Important Message give by:     If discussed at [place] of Stay Meetings, dates discussed:    Additional Comments: Pt is from home, is independent with ADL's, lives alone but has children who check on her often and will be staying with her for a short time after discharge. Pt has no HH services or DME needs prior to admission.  Pt discharging home today with self care. No CM needs.  Sherald Barge, RN 09/17/2014, 11:10 AM

## 2014-09-19 NOTE — Discharge Summary (Signed)
Physician Discharge Summary  Patient ID: [name] MRN: [id number] DOB/AGE: May 01, 1935 79 y.o. Primary Care Physician:Azani Brogdon L, MD Admit date: 09/15/2014 Discharge date: 09/19/2014    Discharge Diagnoses:   Active Problems:   Tobacco use disorder   SMA stenosis   Abdominal pain, right upper quadrant   Essential hypertension   Depression   Hypothyroidism   GERD without esophagitis   Abdominal pain copd    Medication List    TAKE these medications        aspirin EC 81 MG tablet  Take 81 mg by mouth every other day.     Biotin 1000 MCG tablet  Take 500 mcg by mouth daily.  Notes to Patient:  Resume medication as directed      CALCIUM-VITAMIN D PO  Take 2 capsules by mouth daily. Calcium 1200mg  + Vitamin D (unknown strength)  Notes to Patient:  Resume medication as directed      DILT-XR 120 MG 24 hr capsule  Generic drug:  diltiazem  Take 120 mg by mouth daily.     DULoxetine 60 MG capsule  Commonly known as:  CYMBALTA  Take 60 mg by mouth daily.     Flax Seed Oil 1000 MG Caps  Take 1 capsule by mouth daily.  Notes to Patient:  Resume medication as directed      ibandronate 150 MG tablet  Commonly known as:  BONIVA  Take 150 mg by mouth every 30 (thirty) days.  Notes to Patient:  Resume medication as directed      isosorbide mononitrate 30 MG 24 hr tablet  Commonly known as:  IMDUR  Take 0.5 tablets (15 mg total) by mouth daily.     levothyroxine 88 MCG tablet  Commonly known as:  SYNTHROID, LEVOTHROID  Take 1 tablet (88 mcg total) by mouth daily.     metoCLOPramide 5 MG tablet  Commonly known as:  REGLAN  Take 1 tablet (5 mg total) by mouth 3 (three) times daily before meals.     multivitamin with minerals Tabs tablet  Take 1 tablet by mouth daily.  Notes to Patient:  Resume medication as directed     niacin 500 MG tablet  Take 500 mg by mouth at bedtime.     nicotine 14 mg/24hr patch  Commonly known as:  NICODERM CQ - dosed in  mg/24 hours  Place 1 patch (14 mg total) onto the skin daily.     ondansetron 4 MG tablet  Commonly known as:  ZOFRAN  Take 1 tablet (4 mg total) by mouth 2 (two) times daily.  Notes to Patient:  Take as directed      pantoprazole 40 MG tablet  Commonly known as:  PROTONIX  Take 40 mg by mouth daily.     polyethylene glycol packet  Commonly known as:  MIRALAX / GLYCOLAX  Take 17 g by mouth every other day.  Notes to Patient:  Resume medication as directed      vitamin C 1000 MG tablet  Take 1,000 mg by mouth 2 (two) times daily.  Notes to Patient:  Resume medication as directed     VITAMIN-B COMPLEX PO  Take 1,000 mcg by mouth daily.  Notes to Patient:  Resume medication as directed        Discharged Condition: Improved    Consults: Gastroenterology  Significant Diagnostic Studies: Ct Abdomen Pelvis W Contrast  09/15/2014   CLINICAL DATA:  79 year old female with abdominal pain for a few days. Nausea,  vomiting and diarrhea. Prior abdominal hysterectomy and cholecystectomy. Initial encounter.  EXAM: CT ABDOMEN AND PELVIS WITH CONTRAST  TECHNIQUE: Multidetector CT imaging of the abdomen and pelvis was performed using the standard protocol following bolus administration of intravenous contrast.  CONTRAST:  123mL OMNIPAQUE IOHEXOL 300 MG/ML  SOLN  COMPARISON:  None.  FINDINGS: Prominent atherosclerotic type changes of the aorta and aortic branch vessels including the iliac artery and femoral arteries. These vessels are narrowed. Long segment high-grade stenosis of the proximal superior mesenteric artery.  Prominent stool rectosigmoid region. No surrounding inflammation as may be seen with stercoral colitis.  Prominent sigmoid diverticula without extra luminal bowel inflammatory process, free fluid or free air.  Post cholecystectomy with mild prominence of the common bile duct felt to be related to the age and post cholecystectomy state. No worrisome hepatic, splenic, pancreatic,  renal or adrenal lesion. Left adrenal hyperplasia noted.  Noncontrast filled views of the urinary bladder unremarkable.  Soft tissue stranding inferior to the ischium greater on the right may be related to pressure phenomena without discrete skin ulcer.  Lung bases clear.  Prominent coronary artery calcifications.  Markedly abnormal appearance of the L1-2 disc space. The L1 vertebral body is collapsed into the L2 vertebral body with posterior projection of the posterior inferior aspect of the L1 vertebral body contributing to significant spinal stenosis. This is noted on prior plain film exam of 10/06/2013.  IMPRESSION: Prominent stool rectosigmoid region. No surrounding inflammation as may be seen with stercoral colitis.  Prominent sigmoid diverticula without extra luminal bowel inflammatory process, free fluid or free air.  Diffuse atherosclerotic type changes with coronary artery calcifications, narrowing of the ectatic abdominal aorta, long segment HIGH-GRADE STENOSIS proximal superior mesenteric artery, narrowing of the iliac arteries and femoral arteries.  Markedly abnormal appearance of the L1-2 disc space. The L1 vertebral body is collapsed into the L2 vertebral body with posterior projection of the posterior inferior aspect of the L1 vertebral body contributing to significant spinal stenosis. This is noted on prior plain film exam of 10/06/2013.  Please see above.   Electronically Signed   By: Genia Del M.D.   On: 09/15/2014 15:53   Ct Angio Abd/pel W/ And/or W/o  09/16/2014   ADDENDUM REPORT: 09/16/2014 10:29  ADDENDUM: Findings were directly communicated to Dr. Laural Golden by phone. The patient has just had a colonoscopy and does not have evidence of a mass in the rectum or mass involving the proximal colon. Findings therefore likely relate to inflammation/ischemia of the proximal colon. The rectal findings may relate to adherent stool.   Electronically Signed   By: Aletta Edouard M.D.   On: 09/16/2014  10:29   09/16/2014   CLINICAL DATA:  Abdominal pain with nausea and vomiting.  EXAM: CTA ABDOMEN AND PELVIS WITHOUT CONTRAST  TECHNIQUE: Multidetector CT imaging of the abdomen and pelvis was performed using the standard protocol during bolus administration of intravenous contrast. Multiplanar reconstructed images and MIPs were obtained and reviewed to evaluate the vascular anatomy.  CONTRAST:  162mL OMNIPAQUE IOHEXOL 350 MG/ML SOLN  COMPARISON:  CT of the abdomen and pelvis on 09/15/2014  FINDINGS: The abdominal aorta demonstrates severe atherosclerosis with a large amount of heavily calcified plaque present. Focal plaque just inferior to the renal arteries causes approximately 50% stenosis of the aortic lumen. The aorta is tortuous and shows no evidence of aneurysmal disease.  Heavily calcified plaque at the origin of the celiac axis causes approximately 40- 50% stenosis. Distal branches are patent  with the splenic artery showing diffuse calcification. No splenic artery aneurysm identified.  There is significant disease involving a long segment of the proximal superior mesenteric artery. Heavily calcified plaque at the origin causes occlusion of the proximal trunk with occlusive disease extending for approximately 3 cm beyond the origin. There is reconstitution of the distal trunk by probable retrograde flow and proximal jejunal and pancreaticoduodenal branches. It is felt that the proximal SMA is likely completely occluded. However, some trickle flow cannot be excluded by CTA. The reconstituted distal trunk of the SMA shows diffuse calcified plaque and is small in caliber.  The inferior mesenteric artery is open. There are 2 separate right renal arteries with the superior renal artery of smaller caliber an supplying the posterior right kidney. This artery demonstrates stenosis in the range of 50- 60% at its origin. A larger inferior right renal artery shows roughly 50% origin stenosis by calcified plaque. A  single left renal artery demonstrates roughly 50- 60% stenosis at its origin by calcified plaque.  Diffuse iliac disease is noted bilaterally with heavily calcified plaque throughout the common, external and internal iliac arteries. This causes diffuse stenosis which is most significant at the level of the proximal to mid right external iliac artery with narrowing likely approaching 70% in maximal caliber. No iliac artery aneurysms are identified. The common femoral arteries demonstrate calcified plaque without high-grade stenosis. Both femoral bifurcations are open.  Nonvascular evaluation shows inflammation and thickening involving the cecum and ascending colon. This is quite prominent and may be secondary to ischemia. A component of mass lesion cannot be excluded by CT. There also is asymmetric thickening involving the posterior and left wall of the distal rectum over a roughly 5 cm segment. Polypoid soft tissue extends into the rectal lumen at this level and findings are highly suspicious for a rectal mass lesion. There is no evidence of small bowel obstruction, free air or free fluid. Diffuse diverticulosis of the sigmoid colon is noted.  The liver, pancreas, spleen, adrenal glands and kidneys are unremarkable. No enlarged lymph nodes are seen. The bladder appears normal. No hernias are seen. The lumbar spine demonstrates advanced diffuse spondylosis with loss of height of the L1 vertebral body and associated scoliosis.  Review of the MIP images confirms the above findings.  IMPRESSION: 1. Severe aortoiliac atherosclerosis with diffuse calcified plaque present. There is roughly 50% focal stenosis of the aorta just inferior to the renal arteries and diffuse disease of bilateral iliac arteries, most significantly at the level of the right external iliac artery. No evidence of aortic aneurysm. 2. Significant mesenteric occlusive disease primarily involving the superior mesenteric artery. By CTA, the proximal 3 cm  of the SMA trunk is likely chronically occluded with distal reconstitution by retrograde flow and pancreaticoduodenal and proximal jejunal branches. Trickle flow through the segment of occlusive disease cannot be excluded by CTA. Moderate range stenosis of the celiac origin is estimated to be approximately 40- 50%. The inferior mesenteric artery is open. 3. Two separate right renal arteries and a single left renal artery all demonstrate moderate range origins stenoses in the 50- 60% range. 4. Inflammation and thickening involving the cecum and ascending colon. This may be secondary to ischemia. Mass lesion cannot be excluded. A rectal mass involving the posterior and left wall of the distal rectum is suspected by CT. Correlation suggested with rectal exam. Consider colonoscopy correlation.  Electronically Signed: By: Aletta Edouard M.D. On: 09/16/2014 10:24    Lab Results: Basic Metabolic  Panel: No results for input(s): NA, K, CL, CO2, GLUCOSE, BUN, CREATININE, CALCIUM, MG, PHOS in the last 72 hours. Liver Function Tests: No results for input(s): AST, ALT, ALKPHOS, BILITOT, PROT, ALBUMIN in the last 72 hours.   CBC: No results for input(s): WBC, NEUTROABS, HGB, HCT, MCV, PLT in the last 72 hours.  No results found for this or any previous visit (from the past 240 hour(s)).   Hospital Course: This is a 79 year old who's been having abdominal pain. She came to the emergency department because of abdominal pain nausea some vomiting. She had CT angiogram done which showed that she has what looks like significant superior mesenteric artery stenosis. She also has some diffuse vascular disease. She has had previous problems with arterial disease having had a previous stroke. She had consultation with her gastroenterologist and initially discussion was undertaken with interventional radiology but they felt they could not help. She initially had more trouble with nausea vomiting etc. but then improved and was  able to tolerate food and is discharged home for outpatient follow-up with her vascular surgeon. Her vascular surgeon was in the operating room when she was being discharged but I did discuss the situation with the OR nurse took his call.  Discharge Exam: Blood pressure 115/44, pulse 95, temperature 98.5 F (36.9 C), temperature source Oral, resp. rate 18, height 5\' 1"  (1.549 m), weight 58.06 kg (128 lb), SpO2 90 %. She is awake and alert. She is thin. Her chest is clear.  Disposition: Home      Signed: Chrishun Scheer L   09/19/2014, 8:44 AM

## 2014-09-23 DIAGNOSIS — M5441 Lumbago with sciatica, right side: Secondary | ICD-10-CM | POA: Diagnosis not present

## 2014-09-23 DIAGNOSIS — M47816 Spondylosis without myelopathy or radiculopathy, lumbar region: Secondary | ICD-10-CM | POA: Diagnosis not present

## 2014-09-23 DIAGNOSIS — M9903 Segmental and somatic dysfunction of lumbar region: Secondary | ICD-10-CM | POA: Diagnosis not present

## 2014-09-25 DIAGNOSIS — M47816 Spondylosis without myelopathy or radiculopathy, lumbar region: Secondary | ICD-10-CM | POA: Diagnosis not present

## 2014-09-25 DIAGNOSIS — M5441 Lumbago with sciatica, right side: Secondary | ICD-10-CM | POA: Diagnosis not present

## 2014-09-25 DIAGNOSIS — M546 Pain in thoracic spine: Secondary | ICD-10-CM | POA: Diagnosis not present

## 2014-09-25 DIAGNOSIS — M9903 Segmental and somatic dysfunction of lumbar region: Secondary | ICD-10-CM | POA: Diagnosis not present

## 2014-09-28 DIAGNOSIS — M47816 Spondylosis without myelopathy or radiculopathy, lumbar region: Secondary | ICD-10-CM | POA: Diagnosis not present

## 2014-09-28 DIAGNOSIS — M546 Pain in thoracic spine: Secondary | ICD-10-CM | POA: Diagnosis not present

## 2014-09-28 DIAGNOSIS — M5441 Lumbago with sciatica, right side: Secondary | ICD-10-CM | POA: Diagnosis not present

## 2014-09-28 DIAGNOSIS — S338XXA Sprain of other parts of lumbar spine and pelvis, initial encounter: Secondary | ICD-10-CM | POA: Diagnosis not present

## 2014-09-28 DIAGNOSIS — M9903 Segmental and somatic dysfunction of lumbar region: Secondary | ICD-10-CM | POA: Diagnosis not present

## 2014-10-02 DIAGNOSIS — G458 Other transient cerebral ischemic attacks and related syndromes: Secondary | ICD-10-CM | POA: Diagnosis not present

## 2014-10-02 DIAGNOSIS — I6529 Occlusion and stenosis of unspecified carotid artery: Secondary | ICD-10-CM | POA: Diagnosis not present

## 2014-10-08 ENCOUNTER — Ambulatory Visit
Admission: RE | Admit: 2014-10-08 | Discharge: 2014-10-08 | Disposition: A | Discharge status: Home / Self Care | Payer: Commercial Managed Care - HMO | Source: Ambulatory Visit | Attending: Vascular Surgery | Admitting: Vascular Surgery

## 2014-10-08 ENCOUNTER — Encounter: Payer: Self-pay | Admitting: *Deleted

## 2014-10-08 ENCOUNTER — Encounter
Admission: RE | Disposition: A | Discharge status: Home / Self Care | Payer: Self-pay | Source: Ambulatory Visit | Attending: Vascular Surgery

## 2014-10-08 DIAGNOSIS — F17200 Nicotine dependence, unspecified, uncomplicated: Secondary | ICD-10-CM | POA: Diagnosis not present

## 2014-10-08 DIAGNOSIS — I10 Essential (primary) hypertension: Secondary | ICD-10-CM | POA: Insufficient documentation

## 2014-10-08 DIAGNOSIS — I739 Peripheral vascular disease, unspecified: Secondary | ICD-10-CM | POA: Diagnosis not present

## 2014-10-08 DIAGNOSIS — I771 Stricture of artery: Secondary | ICD-10-CM | POA: Insufficient documentation

## 2014-10-08 DIAGNOSIS — I6529 Occlusion and stenosis of unspecified carotid artery: Secondary | ICD-10-CM | POA: Insufficient documentation

## 2014-10-08 DIAGNOSIS — Z7982 Long term (current) use of aspirin: Secondary | ICD-10-CM | POA: Diagnosis not present

## 2014-10-08 DIAGNOSIS — L97509 Non-pressure chronic ulcer of other part of unspecified foot with unspecified severity: Secondary | ICD-10-CM | POA: Diagnosis not present

## 2014-10-08 DIAGNOSIS — R634 Abnormal weight loss: Secondary | ICD-10-CM | POA: Insufficient documentation

## 2014-10-08 DIAGNOSIS — Z79899 Other long term (current) drug therapy: Secondary | ICD-10-CM | POA: Diagnosis not present

## 2014-10-08 DIAGNOSIS — K551 Chronic vascular disorders of intestine: Secondary | ICD-10-CM | POA: Diagnosis not present

## 2014-10-08 HISTORY — DX: Anxiety disorder, unspecified: F41.9

## 2014-10-08 HISTORY — PX: PERIPHERAL VASCULAR CATHETERIZATION: SHX172C

## 2014-10-08 HISTORY — DX: Cerebral infarction, unspecified: I63.9

## 2014-10-08 HISTORY — DX: Pneumonia, unspecified organism: J18.9

## 2014-10-08 LAB — CREATININE, SERUM
Creatinine, Ser: 0.87 mg/dL (ref 0.44–1.00)
GFR calc Af Amer: >60 mL/min (ref >60)

## 2014-10-08 LAB — BUN: BUN: 17 mg/dL (ref 6–20)

## 2014-10-08 SURGERY — VISCERAL ANGIOGRAPHY
Anesthesia: Moderate Sedation

## 2014-10-08 MED ORDER — MIDAZOLAM HCL 5 MG/5ML IJ SOLN
INTRAMUSCULAR | Status: AC
  Filled 2014-10-08: qty 5

## 2014-10-08 MED ORDER — MIDAZOLAM HCL 2 MG/2ML IJ SOLN
INTRAMUSCULAR | Status: DC | PRN
Start: 2014-10-08 — End: 2014-10-08
  Administered 2014-10-08: 2 mg via INTRAVENOUS
  Administered 2014-10-08: 1 mg via INTRAVENOUS

## 2014-10-08 MED ORDER — FENTANYL CITRATE (PF) 100 MCG/2ML IJ SOLN
INTRAMUSCULAR | Status: DC | PRN
  Administered 2014-10-08 (×2): 50 ug via INTRAVENOUS

## 2014-10-08 MED ORDER — LIDOCAINE-EPINEPHRINE (PF) 1 %-1:200000 IJ SOLN
INTRAMUSCULAR | Status: AC
  Filled 2014-10-08: qty 30

## 2014-10-08 MED ORDER — FENTANYL CITRATE (PF) 100 MCG/2ML IJ SOLN
INTRAMUSCULAR | Status: AC
  Filled 2014-10-08: qty 2

## 2014-10-08 MED ORDER — SODIUM CHLORIDE 0.9 % IV SOLN
INTRAVENOUS | Status: DC
  Administered 2014-10-08: 11:00:00 via INTRAVENOUS

## 2014-10-08 MED ORDER — IOHEXOL 300 MG/ML  SOLN
INTRAMUSCULAR | Status: DC | PRN
Start: 2014-10-08 — End: 2014-10-08
  Administered 2014-10-08: 70 mL via INTRA_ARTERIAL

## 2014-10-08 MED ORDER — HEPARIN SODIUM (PORCINE) 1000 UNIT/ML IJ SOLN
INTRAMUSCULAR | Status: DC | PRN
  Administered 2014-10-08 (×2): 2000 [IU] via INTRAVENOUS

## 2014-10-08 MED ORDER — CEFAZOLIN SODIUM 1-5 GM-% IV SOLN
INTRAVENOUS | Status: AC
  Filled 2014-10-08: qty 50

## 2014-10-08 MED ORDER — HEPARIN (PORCINE) IN NACL 2-0.9 UNIT/ML-% IJ SOLN
INTRAMUSCULAR | Status: AC
  Filled 2014-10-08: qty 1000

## 2014-10-08 MED ORDER — HEPARIN SODIUM (PORCINE) 1000 UNIT/ML IJ SOLN
INTRAMUSCULAR | Status: AC
  Filled 2014-10-08: qty 1

## 2014-10-08 MED ORDER — LIDOCAINE-EPINEPHRINE (PF) 1 %-1:200000 IJ SOLN
INTRAMUSCULAR | Status: DC | PRN
  Administered 2014-10-08: 10 mL via INTRADERMAL

## 2014-10-08 MED ORDER — CEFAZOLIN SODIUM 1-5 GM-% IV SOLN
1.0000 g | Freq: Once | INTRAVENOUS | Status: AC
  Administered 2014-10-08: 1 g via INTRAVENOUS

## 2014-10-08 SURGICAL SUPPLY — 15 items
BALLN ARMADA 6X20X80 (BALLOONS) ×2
BALLOON ARMADA 6X20X80 (BALLOONS) ×1 IMPLANT
CATH C2 65CM (CATHETERS) ×2 IMPLANT
CATH ROYAL FLUSH PIG 5F 70CM (CATHETERS) ×2 IMPLANT
CATH SLIP KMP 65CM 5FR (CATHETERS) ×2 IMPLANT
CATH VS1 5F ANGIO SLIP (CATHETERS) ×2 IMPLANT
DEVICE PRESTO INFLATION (MISCELLANEOUS) ×2 IMPLANT
DEVICE STARCLOSE SE CLOSURE (Vascular Products) ×1 IMPLANT
GLIDEWIRE ADV .035X180CM (WIRE) ×2 IMPLANT
PACK ANGIOGRAPHY (CUSTOM PROCEDURE TRAY) ×2 IMPLANT
SHEATH ANL2 6FRX45 HC (SHEATH) ×2 IMPLANT
SHEATH BRITE TIP 5FRX11 (SHEATH) ×2 IMPLANT
SYR MEDRAD MARK V 150ML (SYRINGE) ×2 IMPLANT
TUBING CONTRAST HIGH PRESS 72 (TUBING) ×2 IMPLANT
WIRE J 3MM .035X145CM (WIRE) ×2 IMPLANT

## 2014-10-08 NOTE — Op Note (Signed)
[facility] Percutaneous Study/Intervention Procedural Note     Surgeon(s): Sonny Anthes  Assistants: none  Pre-operative Diagnosis: 1. Chronic mesenteric ischemia 2. Carotid artery stenosis 3. Weight loss  Post-operative diagnosis: Same  Procedure(s) Performed: 1. Ultrasound guidance for vascular access right femoral artery 2. Catheter placement into celiac from right femoral approach 3. Aortogram and selective angiogram of the celiac artery 4. PTA of celiac with 6 mm x 2 cm angioplasty balloon 5. StarClose closure device right femoral artery   EBL: minimal  Indications: Patient is a 79 year old white female with food fear, abdominal pain, and unintentional weight loss consistent with chronic mesenteric ischemia. The patient had a CT scan showing severe mesenteric disease with occlusion of the SMA and stenosis of the celiac, but the degree of stenosis was difficult to discern. The patient is brought in for angiography for further evaluation and potential treatment. Risks and benefits are discussed and informed consent is obtained  Procedure: The patient was identified and appropriate procedural time out was performed. The patient was then placed supine on the table and prepped and draped in the usual sterile fashion. Ultrasound was used to evaluate the right common femoral artery. It was patent . A digital ultrasound image was acquired. A Seldinger needle was used to access the right common femoral artery under direct ultrasound guidance and a permanent image was performed. A 0.035 J wire was advanced without resistance and a 5Fr sheath was placed. Pigtail catheter was placed into the aorta and an AP aortogram was performed. This demonstrated what appeared to be moderate bilateral renal artery stenosis and flow through the celiac artery although the SMA flow was not  well visualized. We transitioned to the lateral projection to image the SMA and celiac artery. The celiac artery appeared to have at least a 70% stenosis at its origin and the SMA had a long segment occlusion. I then used a VS 1 catheter to cannulate the celiac artery.  This demonstrated what I would estimate is a 75-80% stenosis of the celiac artery at its origin. Based on her significant symptoms, and the unlikely nature to be able to treat a long segment occlusion of the SMA percutaneously, I elected to treat her celiac artery stenosis. The patient was given 4000 units of intravenous heparin and a 6 [place] Ansell sheath was placed into the aorta for purchase. The VS 1 catheter and the advantage wire were used to cross the stenosis and parked the wire well out the splenic artery. The sheath was taken to the origin of the celiac artery. Angioplasty was then performed with a 6 mm diameter by 2 cm length angioplasty balloon inflated to 10 atm for 1 minute. The balloon was deflated and completion angiogram was performed. This demonstrated a less than 10% residual stenosis after angioplasty and no dissection with brisk flow through the celiac artery. I elected to terminate the procedure. The diagnostic catheter was removed. StarClose closure device was deployed in usual fashion with excellent hemostatic result. The patient was taken to the recovery room in stable condition having tolerated the procedure well.     Findings: 75-80% celiac artery stenosis markedly improved with angioplasty. Long SMA occlusion  Disposition: Patient was taken to the recovery room in stable condition having tolerated the procedure well.  Complications: None  Merrily Tegeler 10/08/2014 1:03 PM

## 2014-10-08 NOTE — H&P (Signed)
  [facility] History & Physical Update  The patient was interviewed and re-examined.  The patient's previous History and Physical has been reviewed and is unchanged.  There is no change in the plan of care. We plan to proceed with the scheduled procedure.  Janita Camberos, MD  10/08/2014, 11:51 AM

## 2014-10-09 DIAGNOSIS — K551 Chronic vascular disorders of intestine: Secondary | ICD-10-CM | POA: Diagnosis not present

## 2014-10-09 DIAGNOSIS — L97509 Non-pressure chronic ulcer of other part of unspecified foot with unspecified severity: Secondary | ICD-10-CM | POA: Diagnosis not present

## 2014-10-09 DIAGNOSIS — F17200 Nicotine dependence, unspecified, uncomplicated: Secondary | ICD-10-CM | POA: Diagnosis not present

## 2014-10-09 DIAGNOSIS — R634 Abnormal weight loss: Secondary | ICD-10-CM | POA: Diagnosis not present

## 2014-10-09 DIAGNOSIS — I739 Peripheral vascular disease, unspecified: Secondary | ICD-10-CM | POA: Diagnosis not present

## 2014-10-09 DIAGNOSIS — I10 Essential (primary) hypertension: Secondary | ICD-10-CM | POA: Diagnosis not present

## 2014-10-09 DIAGNOSIS — I6529 Occlusion and stenosis of unspecified carotid artery: Secondary | ICD-10-CM | POA: Diagnosis not present

## 2014-10-09 DIAGNOSIS — I771 Stricture of artery: Secondary | ICD-10-CM | POA: Diagnosis not present

## 2014-10-09 DIAGNOSIS — Z79899 Other long term (current) drug therapy: Secondary | ICD-10-CM | POA: Diagnosis not present

## 2014-10-17 DIAGNOSIS — M7502 Adhesive capsulitis of left shoulder: Secondary | ICD-10-CM | POA: Diagnosis not present

## 2014-10-21 ENCOUNTER — Emergency Department (HOSPITAL_COMMUNITY)
Admission: EM | Admit: 2014-10-21 | Discharge: 2014-10-22 | Disposition: A | Discharge status: Home / Self Care | Payer: Commercial Managed Care - HMO | Attending: Emergency Medicine | Admitting: Emergency Medicine

## 2014-10-21 ENCOUNTER — Encounter (HOSPITAL_COMMUNITY): Payer: Self-pay | Admitting: Emergency Medicine

## 2014-10-21 ENCOUNTER — Emergency Department (HOSPITAL_COMMUNITY): Payer: Commercial Managed Care - HMO

## 2014-10-21 DIAGNOSIS — Z8673 Personal history of transient ischemic attack (TIA), and cerebral infarction without residual deficits: Secondary | ICD-10-CM | POA: Diagnosis not present

## 2014-10-21 DIAGNOSIS — Z90710 Acquired absence of both cervix and uterus: Secondary | ICD-10-CM | POA: Insufficient documentation

## 2014-10-21 DIAGNOSIS — Z79899 Other long term (current) drug therapy: Secondary | ICD-10-CM | POA: Diagnosis not present

## 2014-10-21 DIAGNOSIS — M797 Fibromyalgia: Secondary | ICD-10-CM | POA: Insufficient documentation

## 2014-10-21 DIAGNOSIS — I10 Essential (primary) hypertension: Secondary | ICD-10-CM | POA: Diagnosis not present

## 2014-10-21 DIAGNOSIS — K219 Gastro-esophageal reflux disease without esophagitis: Secondary | ICD-10-CM | POA: Diagnosis not present

## 2014-10-21 DIAGNOSIS — F329 Major depressive disorder, single episode, unspecified: Secondary | ICD-10-CM | POA: Diagnosis not present

## 2014-10-21 DIAGNOSIS — K5900 Constipation, unspecified: Secondary | ICD-10-CM | POA: Insufficient documentation

## 2014-10-21 DIAGNOSIS — Z720 Tobacco use: Secondary | ICD-10-CM | POA: Diagnosis not present

## 2014-10-21 DIAGNOSIS — Z8701 Personal history of pneumonia (recurrent): Secondary | ICD-10-CM | POA: Diagnosis not present

## 2014-10-21 DIAGNOSIS — R1084 Generalized abdominal pain: Secondary | ICD-10-CM | POA: Insufficient documentation

## 2014-10-21 DIAGNOSIS — E039 Hypothyroidism, unspecified: Secondary | ICD-10-CM | POA: Insufficient documentation

## 2014-10-21 DIAGNOSIS — Z7982 Long term (current) use of aspirin: Secondary | ICD-10-CM | POA: Insufficient documentation

## 2014-10-21 DIAGNOSIS — J449 Chronic obstructive pulmonary disease, unspecified: Secondary | ICD-10-CM | POA: Diagnosis not present

## 2014-10-21 DIAGNOSIS — F419 Anxiety disorder, unspecified: Secondary | ICD-10-CM | POA: Diagnosis not present

## 2014-10-21 DIAGNOSIS — Z9049 Acquired absence of other specified parts of digestive tract: Secondary | ICD-10-CM | POA: Insufficient documentation

## 2014-10-21 DIAGNOSIS — M1990 Unspecified osteoarthritis, unspecified site: Secondary | ICD-10-CM | POA: Insufficient documentation

## 2014-10-21 LAB — CBC WITH DIFFERENTIAL/PLATELET
BASOS PCT: 0 % (ref 0–1)
Basophils Absolute: 0 10*3/uL (ref 0.0–0.1)
EOS ABS: 0 10*3/uL (ref 0.0–0.7)
EOS PCT: 0 % (ref 0–5)
HCT: 40.6 % (ref 36.0–46.0)
HEMOGLOBIN: 13.8 g/dL (ref 12.0–15.0)
LYMPHS ABS: 0.8 10*3/uL (ref 0.7–4.0)
Lymphocytes Relative: 5 % — ABNORMAL LOW (ref 12–46)
MCH: 28.8 pg (ref 26.0–34.0)
MCHC: 34 g/dL (ref 30.0–36.0)
MCV: 84.8 fL (ref 78.0–100.0)
MONOS PCT: 7 % (ref 3–12)
Monocytes Absolute: 1.3 10*3/uL — ABNORMAL HIGH (ref 0.1–1.0)
NEUTROS PCT: 88 % — AB (ref 43–77)
Neutro Abs: 16.6 10*3/uL — ABNORMAL HIGH (ref 1.7–7.7)
PLATELETS: 458 10*3/uL — AB (ref 150–400)
RBC: 4.79 MIL/uL (ref 3.87–5.11)
RDW: 19.3 % — ABNORMAL HIGH (ref 11.5–15.5)
WBC: 18.8 10*3/uL — AB (ref 4.0–10.5)

## 2014-10-21 LAB — LIPASE, BLOOD: Lipase: 27 U/L (ref 22–51)

## 2014-10-21 LAB — COMPREHENSIVE METABOLIC PANEL
ALK PHOS: 63 U/L (ref 38–126)
ALT: 47 U/L (ref 14–54)
ANION GAP: 11 (ref 5–15)
AST: 42 U/L — ABNORMAL HIGH (ref 15–41)
Albumin: 4 g/dL (ref 3.5–5.0)
BILIRUBIN TOTAL: 0.6 mg/dL (ref 0.3–1.2)
BUN: 26 mg/dL — ABNORMAL HIGH (ref 6–20)
CALCIUM: 8.8 mg/dL — AB (ref 8.9–10.3)
CO2: 25 mmol/L (ref 22–32)
CREATININE: 0.63 mg/dL (ref 0.44–1.00)
Chloride: 99 mmol/L — ABNORMAL LOW (ref 101–111)
GFR calc Af Amer: >60 mL/min (ref >60)
GFR calc non Af Amer: >60 mL/min (ref >60)
GLUCOSE: 163 mg/dL — AB (ref 65–99)
Potassium: 4 mmol/L (ref 3.5–5.1)
Sodium: 135 mmol/L (ref 135–145)
TOTAL PROTEIN: 7.3 g/dL (ref 6.5–8.1)

## 2014-10-21 LAB — LACTIC ACID, PLASMA: LACTIC ACID, VENOUS: 1.5 mmol/L (ref 0.5–2.0)

## 2014-10-21 MED ORDER — FENTANYL CITRATE (PF) 100 MCG/2ML IJ SOLN
50.0000 ug | Freq: Once | INTRAMUSCULAR | Status: AC
  Administered 2014-10-22: 50 ug via INTRAVENOUS
  Filled 2014-10-21: qty 2

## 2014-10-21 MED ORDER — SODIUM CHLORIDE 0.9 % IV BOLUS (SEPSIS)
1000.0000 mL | Freq: Once | INTRAVENOUS | Status: AC
  Administered 2014-10-22: 1000 mL via INTRAVENOUS

## 2014-10-21 NOTE — ED Notes (Signed)
Pt had  Abdominal surgery 2 weeks ago at  [facility],  Onset Sunday nausea, started vomiting this afternoon.

## 2014-10-22 ENCOUNTER — Telehealth (INDEPENDENT_AMBULATORY_CARE_PROVIDER_SITE_OTHER): Payer: Self-pay | Admitting: *Deleted

## 2014-10-22 DIAGNOSIS — M797 Fibromyalgia: Secondary | ICD-10-CM | POA: Diagnosis not present

## 2014-10-22 DIAGNOSIS — E039 Hypothyroidism, unspecified: Secondary | ICD-10-CM | POA: Diagnosis not present

## 2014-10-22 DIAGNOSIS — I701 Atherosclerosis of renal artery: Secondary | ICD-10-CM | POA: Diagnosis not present

## 2014-10-22 DIAGNOSIS — F329 Major depressive disorder, single episode, unspecified: Secondary | ICD-10-CM | POA: Diagnosis not present

## 2014-10-22 DIAGNOSIS — I10 Essential (primary) hypertension: Secondary | ICD-10-CM | POA: Diagnosis not present

## 2014-10-22 DIAGNOSIS — M1990 Unspecified osteoarthritis, unspecified site: Secondary | ICD-10-CM | POA: Diagnosis not present

## 2014-10-22 DIAGNOSIS — F419 Anxiety disorder, unspecified: Secondary | ICD-10-CM | POA: Diagnosis not present

## 2014-10-22 DIAGNOSIS — R1084 Generalized abdominal pain: Secondary | ICD-10-CM | POA: Diagnosis not present

## 2014-10-22 DIAGNOSIS — J449 Chronic obstructive pulmonary disease, unspecified: Secondary | ICD-10-CM | POA: Diagnosis not present

## 2014-10-22 DIAGNOSIS — K219 Gastro-esophageal reflux disease without esophagitis: Secondary | ICD-10-CM | POA: Diagnosis not present

## 2014-10-22 LAB — URINE MICROSCOPIC-ADD ON

## 2014-10-22 LAB — LACTIC ACID, PLASMA: Lactic Acid, Venous: 1.5 mmol/L (ref 0.5–2.0)

## 2014-10-22 LAB — URINALYSIS, ROUTINE W REFLEX MICROSCOPIC
Bilirubin Urine: NEGATIVE
GLUCOSE, UA: NEGATIVE mg/dL
Ketones, ur: NEGATIVE mg/dL
Nitrite: NEGATIVE
PH: 5.5 (ref 5.0–8.0)
PROTEIN: 100 mg/dL — AB
Urobilinogen, UA: 0.2 mg/dL (ref 0.0–1.0)

## 2014-10-22 MED ORDER — IOHEXOL 350 MG/ML SOLN
100.0000 mL | Freq: Once | INTRAVENOUS | Status: AC | PRN
  Administered 2014-10-22: 100 mL via INTRAVENOUS

## 2014-10-22 NOTE — Discharge Instructions (Signed)
Call Dr Olevia Perches office today to let him know about your ED visit. Hopefully, he can see you today and decide what further evaluation your need.  Return to the ED if you get worse again.

## 2014-10-22 NOTE — ED Provider Notes (Signed)
CSN: [id number]     Arrival date & time 10/21/14  2027 History   First MD Initiated Contact with Patient 10/21/14 2305     Chief Complaint  Patient presents with  . Abdominal Pain    s/p surgery     (Consider location/radiation/quality/duration/timing/severity/associated sxs/prior Treatment) HPI patient has a history of prior cholecystectomy and abdominal hysterectomy. 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 She had reported prior to that getting abdominal pain after eating. She reports she was doing well until Sunday, August 28 when she had diffuse abdominal pain that occurred after she ate with nausea and vomiting 2-3 times. That lasted most of the day and then she felt fine the following 2 days until today after she ate around noon time earlier today and she has had diffuse abdominal pain since. She has had nausea and vomited about 3 times today. She denies any blood in the vomitus. Although she did not have diarrhea on Sunday she did start having diarrhea late this afternoon and his had 3 episodes that she describes as chunks. There was no blood or melanotic. She denies any fever. She states she was sweating when she was feeling nauseated. She states this is the same pain she had before when she had her celiac artery stenosis.  PCP Dr Luan Pulling GI Dr Laural Golden  Past Medical History  Diagnosis Date  . Arthritis   . Hypertension   . PONV (postoperative nausea and vomiting)     severe n/v after every surgery  . Hypothyroidism   . Hypothyroid   . Peripheral vascular disease   . Osteoarthritis   . GERD (gastroesophageal reflux disease)   . Constipation due to pain medication   . COPD (chronic obstructive pulmonary disease)   . Depression   . Gastritis   . Diverticulosis   . Fibromyalgia   . Stroke   . Shortness of breath dyspnea   . Pneumonia   .  Anxiety    Past Surgical History  Procedure Laterality Date  . Total knee arthroplasty    . Carotid stent    . Shoulder surgery    . Abdominal hysterectomy    . Cholecystectomy    . Back surgery    . Rotator cuff repair    . Eye surgery      cataract /lens both eyes  . Vascular surgery    . Joint replacement  [id number]    knees  . Anterior cervical decomp/discectomy fusion N/A 04/03/2012    Procedure: ANTERIOR CERVICAL DECOMPRESSION/DISCECTOMY FUSION 2 LEVELS;  Surgeon: Hosie Spangle, MD;  Location: [place] [facility];  Service: Neurosurgery;  Laterality: N/A;  Cervical four-five,Cervical five-six anterior cervical decompression with fusion plating and bonegraft  . Cholecystectomy    . Colonoscopy N/A 07/15/2014    Procedure: COLONOSCOPY;  Surgeon: Rogene Houston, MD;  Location: [facility];  Service: Endoscopy;  Laterality: N/A;  200 - moved to 2:10 - Ann to notify pt  . Esophagogastroduodenoscopy N/A 07/15/2014    Procedure: ESOPHAGOGASTRODUODENOSCOPY (EGD);  Surgeon: Rogene Houston, MD;  Location: [facility];  Service: Endoscopy;  Laterality: N/A;  . Peripheral vascular catheterization N/A 10/08/2014    Procedure: Visceral Angiography;  Surgeon: Algernon Huxley, MD;  Location: [place] [facility];  Service: Cardiovascular;  Laterality: N/A;  . Peripheral vascular catheterization N/A 10/08/2014    Procedure:  Visceral Artery Intervention;  Surgeon: Algernon Huxley, MD;  Location: [place] [facility];  Service: Cardiovascular;  Laterality: N/A;   Family History  Problem Relation Age of Onset  . Pancreatic cancer Mother   . Prostate cancer Father    Social History  Substance Use Topics  . Smoking status: Light Tobacco Smoker -- 0.50 packs/day for 45 years    Types: Cigarettes  . Smokeless tobacco: Current User     Comment:  Patient is using the E-Cigarettes  . Alcohol Use: No   Smokes 3 cigs per day Lives at home Lives alone  OB History    No data available     Review of  Systems  All other systems reviewed and are negative.     Allergies  Aleve; Celebrex; Codeine; and Morphine and related  Home Medications   Prior to Admission medications   Medication Sig Start Date End Date Taking? Authorizing Provider  Ascorbic Acid (VITAMIN C) 1000 MG tablet Take 1,000 mg by mouth 2 (two) times daily.    Yes Historical Provider, MD  aspirin EC 81 MG tablet Take 81 mg by mouth every other day.    Yes Historical Provider, MD  B Complex Vitamins (VITAMIN-B COMPLEX PO) Take 1,000 mcg by mouth daily.    Yes Historical Provider, MD  Biotin 1000 MCG tablet Take 500 mcg by mouth daily.    Yes Historical Provider, MD  CALCIUM-VITAMIN D PO Take 2 capsules by mouth daily. Calcium 1200mg  + Vitamin D (unknown strength)   Yes Historical Provider, MD  DILT-XR 120 MG 24 hr capsule Take 120 mg by mouth daily.  09/03/14  Yes Historical Provider, MD  DULoxetine (CYMBALTA) 60 MG capsule Take 60 mg by mouth daily.   Yes Historical Provider, MD  Flaxseed, Linseed, (FLAX SEED OIL) 1000 MG CAPS Take 1 capsule by mouth daily.    Yes Historical Provider, MD  isosorbide mononitrate (IMDUR) 30 MG 24 hr tablet Take 0.5 tablets (15 mg total) by mouth daily. 09/17/14  Yes Sinda Du, MD  levothyroxine (SYNTHROID, LEVOTHROID) 88 MCG tablet Take 1 tablet (88 mcg total) by mouth daily. 03/12/13  Yes Ivan Anchors Love, PA-C  metoCLOPramide (REGLAN) 5 MG tablet Take 1 tablet (5 mg total) by mouth 3 (three) times daily before meals. 09/17/14  Yes Sinda Du, MD  Multiple Vitamin (MULTIVITAMIN WITH MINERALS) TABS Take 1 tablet by mouth daily.   Yes Historical Provider, MD  niacin 500 MG tablet Take 500 mg by mouth at bedtime.   Yes Historical Provider, MD  nicotine (NICODERM CQ - DOSED IN MG/24 HOURS) 14 mg/24hr patch Place 1 patch (14 mg total) onto the skin daily. 09/17/14  Yes Sinda Du, MD  ondansetron (ZOFRAN) 4 MG tablet Take 1 tablet (4 mg total) by mouth 2 (two) times daily. 09/07/14  Yes Rogene Houston, MD  pantoprazole (PROTONIX) 40 MG tablet Take 40 mg by mouth daily.   Yes Historical Provider, MD  polyethylene glycol (MIRALAX / GLYCOLAX) packet Take 17 g by mouth every other day.   Yes Historical Provider, MD   BP 159/89 mmHg  Pulse 107  Temp(Src) 97.5 F (36.4 C) (Oral)  Resp 16  Ht 5' 1.5" (1.562 m)  Wt 121 lb (54.885 kg)  BMI 22.50 kg/m2  SpO2 91%  Vital signs normal except for hypertension and tachycardia  Physical Exam  Constitutional: She is oriented to person, place, and time.  Non-toxic appearance. She does not appear ill. No distress.  Small elderly female who appears uncomfortable  HENT:  Head: Normocephalic and atraumatic.  Right Ear: External ear normal.  Left Ear: External ear normal.  Nose: Nose normal. No mucosal edema or rhinorrhea.  Mouth/Throat: Oropharynx is clear and moist and mucous membranes are normal. No dental abscesses or uvula swelling.  Eyes: Conjunctivae and EOM are normal. Pupils are equal, round, and reactive to light.  Neck: Normal range of motion and full passive range of motion without pain. Neck supple.  Cardiovascular: Normal rate, regular rhythm and normal heart sounds.  Exam reveals no gallop and no friction rub.   No murmur heard. Pulmonary/Chest: Effort normal and breath sounds normal. No respiratory distress. She has no wheezes. She has no rhonchi. She has no rales. She exhibits no tenderness and no crepitus.  Abdominal: Soft. Normal appearance. She exhibits no distension. There is generalized tenderness. There is no rebound and no guarding.  Patient has diminished bowel sounds  Musculoskeletal: Normal range of motion. She exhibits no edema or tenderness.  Moves all extremities well.   Neurological: She is alert and oriented to person, place, and time. She has normal strength. No cranial nerve deficit.  Skin: Skin is warm, dry and intact. No rash noted. No erythema. No pallor.  Psychiatric: She has a normal mood and affect.  Her speech is normal and behavior is normal. Her mood appears not anxious.  Nursing note and vitals reviewed.   ED Course  Procedures (including critical care time)  Medications  fentaNYL (SUBLIMAZE) injection 50 mcg (50 mcg Intravenous Given 10/22/14 0011)  sodium chloride 0.9 % bolus 1,000 mL (0 mLs Intravenous Stopped 10/22/14 0127)  iohexol (OMNIPAQUE) 350 MG/ML injection 100 mL (100 mLs Intravenous Contrast Given 10/22/14 0037)   Patient was given IV fluids and IV fentanyl for her pain. CT angiogram of the abdomen and pelvis was ordered to make sure her stenosis of her celiac artery had not returned.  Reviewing her labs show some mild dehydration which should've been treated with IV fluids.  Time of discharge she states her pain was gone. We discussed her CT results. We discussed she had diffuse vascular disease but nothing acute that seems to be causing a problem tonight. She states she is comfortable going home and following up at Dr. Dereck Leep for further evaluation. She was advised to return if her pain gets worse and returns again. We discussed the etiology of her pain is unclear this time.  Labs Review Results for orders placed or performed during the hospital encounter of 10/21/14  Comprehensive metabolic panel  Result Value Ref Range   Sodium 135 135 - 145 mmol/L   Potassium 4.0 3.5 - 5.1 mmol/L   Chloride 99 (L) 101 - 111 mmol/L   CO2 25 22 - 32 mmol/L   Glucose, Bld 163 (H) 65 - 99 mg/dL   BUN 26 (H) 6 - 20 mg/dL   Creatinine, Ser 0.63 0.44 - 1.00 mg/dL   Calcium 8.8 (L) 8.9 - 10.3 mg/dL   Total Protein 7.3 6.5 - 8.1 g/dL   Albumin 4.0 3.5 - 5.0 g/dL   AST 42 (H) 15 - 41 U/L   ALT 47 14 - 54 U/L   Alkaline Phosphatase 63 38 - 126 U/L   Total Bilirubin 0.6 0.3 - 1.2 mg/dL   GFR calc non Af Amer >60 >60 mL/min   GFR calc Af Amer >60 >60 mL/min   Anion gap 11 5 - 15  Lipase, blood  Result Value Ref Range  Lipase 27 22 - 51 U/L  CBC with Differential  Result Value Ref  Range   WBC 18.8 (H) 4.0 - 10.5 K/uL   RBC 4.79 3.87 - 5.11 MIL/uL   Hemoglobin 13.8 12.0 - 15.0 g/dL   HCT 40.6 36.0 - 46.0 %   MCV 84.8 78.0 - 100.0 fL   MCH 28.8 26.0 - 34.0 pg   MCHC 34.0 30.0 - 36.0 g/dL   RDW 19.3 (H) 11.5 - 15.5 %   Platelets 458 (H) 150 - 400 K/uL   Neutrophils Relative % 88 (H) 43 - 77 %   Neutro Abs 16.6 (H) 1.7 - 7.7 K/uL   Lymphocytes Relative 5 (L) 12 - 46 %   Lymphs Abs 0.8 0.7 - 4.0 K/uL   Monocytes Relative 7 3 - 12 %   Monocytes Absolute 1.3 (H) 0.1 - 1.0 K/uL   Eosinophils Relative 0 0 - 5 %   Eosinophils Absolute 0.0 0.0 - 0.7 K/uL   Basophils Relative 0 0 - 1 %   Basophils Absolute 0.0 0.0 - 0.1 K/uL  Lactic acid, plasma  Result Value Ref Range   Lactic Acid, Venous 1.5 0.5 - 2.0 mmol/L  Lactic acid, plasma  Result Value Ref Range   Lactic Acid, Venous 1.5 0.5 - 2.0 mmol/L  Urinalysis, Routine w reflex microscopic (not at [facility])  Result Value Ref Range   Color, Urine AMBER (A) YELLOW   APPearance HAZY (A) CLEAR   Specific Gravity, Urine >1.030 (H) 1.005 - 1.030   pH 5.5 5.0 - 8.0   Glucose, UA NEGATIVE NEGATIVE mg/dL   Hgb urine dipstick SMALL (A) NEGATIVE   Bilirubin Urine NEGATIVE NEGATIVE   Ketones, ur NEGATIVE NEGATIVE mg/dL   Protein, ur 100 (A) NEGATIVE mg/dL   Urobilinogen, UA 0.2 0.0 - 1.0 mg/dL   Nitrite NEGATIVE NEGATIVE   Leukocytes, UA TRACE (A) NEGATIVE  Urine microscopic-add on  Result Value Ref Range   Squamous Epithelial / LPF MANY (A) RARE   WBC, UA 21-50 <3 WBC/hpf   RBC / HPF 0-2 <3 RBC/hpf   Bacteria, UA MANY (A) RARE   Casts GRANULAR CAST (A) NEGATIVE   Laboratory interpretation all normal except except for leukocytosis, hyperglycemia, elevation of the BUN consistent with dehydration, contaminated UA, urine culture sent     Imaging Review Ct Cta Abd/pel W/cm &/or W/o Cm  10/22/2014   CLINICAL DATA:  Recent celiac artery angioplasty. Severe abdominal pain.  EXAM: CTA ABDOMEN AND PELVIS wITHOUT AND WITH  CONTRAST  TECHNIQUE: Multidetector CT imaging of the abdomen and pelvis was performed using the standard protocol during bolus administration of intravenous contrast. Multiplanar reconstructed images and MIPs were obtained and reviewed to evaluate the vascular anatomy.  CONTRAST:  128mL OMNIPAQUE IOHEXOL 350 MG/ML SOLN  COMPARISON:  09/16/2014  FINDINGS: The celiac artery is patent. There is slight improvement in luminal patency at its origin, with perhaps a residual 30% stenosis but no hemodynamically significant stenosis. There is no dissection or other complication from the recent celiac artery angioplasty.  There is extensive atherosclerotic plaque throughout the abdominal aorta and both iliac arteries. There is approximately 50% stenosis of the aorta immediately below the renal artery origins. Both iliac arteries are patent but there is stenosis of the external iliac arteries in their proximal and midportions bilaterally, possibly hemodynamically significant on the right.  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 This is unchanged.  There are 2  right renal arteries. The more superior right renal artery is at the level of the superior mesenteric artery origin. It supplies a small portion of the posterior upper pole region. It is mildly stenotic at its origin, 50% diameter reduction. The more caudal right renal artery exhibits less than 50% diameter reduction from a and origin stenosis. This artery is orthotopic in location. The single left renal artery also exhibits a mild origin stenosis of less than 50% diameter reduction. The inferior mesenteric artery is patent.  The abnormality involving the cecum has resolved since 09/16/2014, and this likely was due to ischemia. The irregular soft tissue of the distal rectum is again evident, as previously described. There are otherwise unremarkable appearances of the stomach, small bowel and colon with the exception of uncomplicated  moderate colonic diverticulosis. There are normal appearances of the liver. There is cholecystectomy. The bile ducts are unremarkable. The spleen, pancreas, adrenals and kidneys are unremarkable.  No acute findings are evident in the abdomen or pelvis.  No significant skeletal lesion is evident. There is unchanged anterior compression of L1, chronic. No significant abnormalities are evident in the lower chest. There is mild unchanged linear scarring in the bases.  Review of the MIP images confirms the above findings.  IMPRESSION: 1. No evidence of complication from the recent celiac artery angioplasty. The celiac artery is now only minimally stenotic at its origin. 2. 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 3. Extensive aortoiliac atherosclerotic plaque with approximately 50% diameter reduction of the aorta immediately below the renal artery origins. There probably is a significant stenosis of the right external iliac artery in its midportion. 4. Mild origin stenoses of the 2 right renal arteries and the single left renal artery, less than 50% diameter reduction. 5. The cecal abnormality has resolved and likely was due to ischemia. 6. Cannot exclude a rectal mass.   Electronically Signed   By: Andreas Newport M.D.   On: 10/22/2014 01:20   I have personally reviewed and evaluated these images and lab results as part of my medical decision-making.   EKG Interpretation None      MDM   Final diagnoses:  Generalized abdominal pain   Plan discharge  Rolland Porter, MD, Barbette Or, MD 10/22/14 [phone]

## 2014-10-22 NOTE — Telephone Encounter (Signed)
[name] has been vomiting for 2 days and sweating. She called Dr. Luan Pulling office and was told to go to the ED. Went last night and nothing was done for her. Advised patient to call Dr. Luan Pulling office back to let them know and I would send a message to [place]. Patient states she is very weak. The return phone number is [phone].

## 2014-10-23 NOTE — Telephone Encounter (Signed)
I advised patient to go to the ED.

## 2014-10-24 LAB — URINE CULTURE
Culture: >=100000
Special Requests: NORMAL

## 2014-10-25 ENCOUNTER — Telehealth (HOSPITAL_COMMUNITY): Payer: Self-pay | Admitting: Emergency Medicine

## 2014-10-25 NOTE — Telephone Encounter (Signed)
Post ED Visit - Positive Culture Follow-up: Successful Patient Follow-Up  Culture assessed and recommendations reviewed by: []  Heide Guile, Pharm.D., BCPS-AQ ID []  Alycia Rossetti, Pharm.D., BCPS []  [place], [place].D., BCPS, AAHIVP [x]  [name], Pharm.D., BCPS, AAHIVP []  Tegan Magsam, Pharm.D. []  Milus Glazier, [place].D.  Positive Urine culture  [x]  Patient discharged without antimicrobial prescription and treatment is now indicated []  Organism is resistant to prescribed ED discharge antimicrobial []  Patient with positive blood cultures  Changes discussed with ED provider: Abigail Butts, PA New antibiotic prescription: if symptomatic, Septra DS one tab PO BID x seven days  Will contact patient  Ernesta Amble 10/25/2014, 3:08 PM

## 2014-10-25 NOTE — Progress Notes (Signed)
ED Antimicrobial Stewardship Positive Culture Follow Up   [name] is an 79 y.o. female who presented to [facility] on 10/21/2014 with a chief complaint of  Chief Complaint  Patient presents with  . Abdominal Pain    s/p surgery    Recent Results (from the past 720 hour(s))  Urine culture     Status: None   Collection Time: 10/22/14 12:19 AM  Result Value Ref Range Status   Specimen Description URINE, CLEAN CATCH  Final   Special Requests Normal  Final   Culture   Final    >=100,000 COLONIES/mL ENTEROBACTER CLOACAE Performed at [facility]    Report Status 10/24/2014 FINAL  Final   Organism ID, Bacteria ENTEROBACTER CLOACAE  Final      Susceptibility   Enterobacter cloacae - MIC*    CEFAZOLIN >=64 RESISTANT Resistant     CEFTRIAXONE 4 SENSITIVE Sensitive     CIPROFLOXACIN <=0.25 SENSITIVE Sensitive     GENTAMICIN <=1 SENSITIVE Sensitive     IMIPENEM 1 SENSITIVE Sensitive     NITROFURANTOIN 64 INTERMEDIATE Intermediate     TRIMETH/SULFA <=20 SENSITIVE Sensitive     PIP/TAZO <=4 SENSITIVE Sensitive     * >=100,000 COLONIES/mL ENTEROBACTER CLOACAE    [x]  Patient discharged originally without antimicrobial agent and treatment may be indicated  Discussed with Abigail Butts, PA-C - flow manager to contact patient.  If she is having urinary symptoms give Septra, if no urinary symptoms then no treatment.  Potential New antibiotic prescription: Septra DS 1 tab PO BID x 7 days  ED Provider: Abigail Butts, PA-C   Norva Riffle 10/25/2014, 11:30 AM Infectious Diseases Pharmacist Phone# [phone]

## 2014-10-25 NOTE — Telephone Encounter (Signed)
pt returned call, ID verified. Patient denies any urinary symptoms and states she is feeling better.

## 2014-11-02 ENCOUNTER — Telehealth (INDEPENDENT_AMBULATORY_CARE_PROVIDER_SITE_OTHER): Payer: Self-pay | Admitting: *Deleted

## 2014-11-02 NOTE — Telephone Encounter (Signed)
Daughter-in-law called requesting an apt with Dr. Laural Golden for [name]. She is excessively vomiting. Offered an apt with Terri for 11/04/14 at 10:30 am which was refused. Advised Mrs. Symonds that we were not scheduling anything with Dr. Laural Golden at this time due to schedule changing. that is why he has Deberah Castle, NP here to help him. Daughter-in-law showed out and hung up

## 2014-11-03 DIAGNOSIS — G458 Other transient cerebral ischemic attacks and related syndromes: Secondary | ICD-10-CM | POA: Diagnosis not present

## 2014-11-03 DIAGNOSIS — K551 Chronic vascular disorders of intestine: Secondary | ICD-10-CM | POA: Diagnosis not present

## 2014-11-03 DIAGNOSIS — I6529 Occlusion and stenosis of unspecified carotid artery: Secondary | ICD-10-CM | POA: Diagnosis not present

## 2014-11-03 DIAGNOSIS — I771 Stricture of artery: Secondary | ICD-10-CM | POA: Diagnosis not present

## 2014-11-05 NOTE — Telephone Encounter (Signed)
Above-noted. If she is constantly vomiting there is nothing that could be done in our office. She needs to go to emergency room

## 2014-11-25 DIAGNOSIS — H538 Other visual disturbances: Secondary | ICD-10-CM | POA: Diagnosis not present

## 2014-11-25 DIAGNOSIS — H04123 Dry eye syndrome of bilateral lacrimal glands: Secondary | ICD-10-CM | POA: Diagnosis not present

## 2015-02-12 DIAGNOSIS — S80212A Abrasion, left knee, initial encounter: Secondary | ICD-10-CM | POA: Diagnosis not present

## 2015-02-12 DIAGNOSIS — Z8673 Personal history of transient ischemic attack (TIA), and cerebral infarction without residual deficits: Secondary | ICD-10-CM | POA: Diagnosis not present

## 2015-02-12 DIAGNOSIS — Z96652 Presence of left artificial knee joint: Secondary | ICD-10-CM | POA: Diagnosis not present

## 2015-02-12 DIAGNOSIS — S8992XA Unspecified injury of left lower leg, initial encounter: Secondary | ICD-10-CM | POA: Diagnosis not present

## 2015-02-12 DIAGNOSIS — I10 Essential (primary) hypertension: Secondary | ICD-10-CM | POA: Diagnosis not present

## 2015-02-12 DIAGNOSIS — Z23 Encounter for immunization: Secondary | ICD-10-CM | POA: Diagnosis not present

## 2015-02-12 DIAGNOSIS — F17210 Nicotine dependence, cigarettes, uncomplicated: Secondary | ICD-10-CM | POA: Diagnosis not present

## 2015-02-12 DIAGNOSIS — J449 Chronic obstructive pulmonary disease, unspecified: Secondary | ICD-10-CM | POA: Diagnosis not present

## 2015-03-22 DIAGNOSIS — J449 Chronic obstructive pulmonary disease, unspecified: Secondary | ICD-10-CM | POA: Diagnosis not present

## 2015-03-22 DIAGNOSIS — E785 Hyperlipidemia, unspecified: Secondary | ICD-10-CM | POA: Diagnosis not present

## 2015-03-22 DIAGNOSIS — F329 Major depressive disorder, single episode, unspecified: Secondary | ICD-10-CM | POA: Diagnosis not present

## 2015-03-22 DIAGNOSIS — E039 Hypothyroidism, unspecified: Secondary | ICD-10-CM | POA: Diagnosis not present

## 2015-03-22 DIAGNOSIS — K219 Gastro-esophageal reflux disease without esophagitis: Secondary | ICD-10-CM | POA: Diagnosis not present

## 2015-03-22 DIAGNOSIS — I10 Essential (primary) hypertension: Secondary | ICD-10-CM | POA: Diagnosis not present

## 2015-03-22 DIAGNOSIS — I639 Cerebral infarction, unspecified: Secondary | ICD-10-CM | POA: Diagnosis not present

## 2015-03-25 DIAGNOSIS — I10 Essential (primary) hypertension: Secondary | ICD-10-CM | POA: Diagnosis not present

## 2015-03-25 DIAGNOSIS — F639 Impulse disorder, unspecified: Secondary | ICD-10-CM | POA: Diagnosis not present

## 2015-03-25 DIAGNOSIS — K6389 Other specified diseases of intestine: Secondary | ICD-10-CM | POA: Diagnosis not present

## 2015-03-25 DIAGNOSIS — J441 Chronic obstructive pulmonary disease with (acute) exacerbation: Secondary | ICD-10-CM | POA: Diagnosis not present

## 2015-03-29 DIAGNOSIS — K6389 Other specified diseases of intestine: Secondary | ICD-10-CM | POA: Diagnosis not present

## 2015-03-29 DIAGNOSIS — I639 Cerebral infarction, unspecified: Secondary | ICD-10-CM | POA: Diagnosis not present

## 2015-03-29 DIAGNOSIS — J441 Chronic obstructive pulmonary disease with (acute) exacerbation: Secondary | ICD-10-CM | POA: Diagnosis not present

## 2015-03-29 DIAGNOSIS — I10 Essential (primary) hypertension: Secondary | ICD-10-CM | POA: Diagnosis not present

## 2015-07-26 DIAGNOSIS — S72111A Displaced fracture of greater trochanter of right femur, initial encounter for closed fracture: Secondary | ICD-10-CM | POA: Diagnosis not present

## 2015-07-26 DIAGNOSIS — J449 Chronic obstructive pulmonary disease, unspecified: Secondary | ICD-10-CM | POA: Diagnosis not present

## 2015-07-26 DIAGNOSIS — I10 Essential (primary) hypertension: Secondary | ICD-10-CM | POA: Diagnosis not present

## 2015-07-26 DIAGNOSIS — S7001XA Contusion of right hip, initial encounter: Secondary | ICD-10-CM | POA: Diagnosis not present

## 2015-07-26 DIAGNOSIS — F17210 Nicotine dependence, cigarettes, uncomplicated: Secondary | ICD-10-CM | POA: Diagnosis not present

## 2015-07-27 DIAGNOSIS — S72111A Displaced fracture of greater trochanter of right femur, initial encounter for closed fracture: Secondary | ICD-10-CM | POA: Diagnosis not present

## 2015-07-31 ENCOUNTER — Inpatient Hospital Stay (HOSPITAL_COMMUNITY)
Admission: EM | Admit: 2015-07-31 | Discharge: 2015-08-04 | DRG: 481 | Disposition: A | Discharge status: Skilled Nursing Facility | Payer: Medicare Other | Attending: Pulmonary Disease | Admitting: Pulmonary Disease

## 2015-07-31 ENCOUNTER — Encounter (HOSPITAL_COMMUNITY): Payer: Self-pay | Admitting: Emergency Medicine

## 2015-07-31 ENCOUNTER — Emergency Department (HOSPITAL_COMMUNITY): Payer: Medicare Other

## 2015-07-31 DIAGNOSIS — S72001A Fracture of unspecified part of neck of right femur, initial encounter for closed fracture: Secondary | ICD-10-CM

## 2015-07-31 DIAGNOSIS — I10 Essential (primary) hypertension: Secondary | ICD-10-CM | POA: Diagnosis not present

## 2015-07-31 DIAGNOSIS — S72141D Displaced intertrochanteric fracture of right femur, subsequent encounter for closed fracture with routine healing: Secondary | ICD-10-CM | POA: Diagnosis not present

## 2015-07-31 DIAGNOSIS — M25519 Pain in unspecified shoulder: Secondary | ICD-10-CM | POA: Diagnosis not present

## 2015-07-31 DIAGNOSIS — I779 Disorder of arteries and arterioles, unspecified: Secondary | ICD-10-CM | POA: Diagnosis not present

## 2015-07-31 DIAGNOSIS — M79631 Pain in right forearm: Secondary | ICD-10-CM | POA: Diagnosis not present

## 2015-07-31 DIAGNOSIS — I739 Peripheral vascular disease, unspecified: Secondary | ICD-10-CM | POA: Diagnosis present

## 2015-07-31 DIAGNOSIS — S4991XA Unspecified injury of right shoulder and upper arm, initial encounter: Secondary | ICD-10-CM | POA: Diagnosis not present

## 2015-07-31 DIAGNOSIS — J449 Chronic obstructive pulmonary disease, unspecified: Secondary | ICD-10-CM | POA: Diagnosis present

## 2015-07-31 DIAGNOSIS — F17210 Nicotine dependence, cigarettes, uncomplicated: Secondary | ICD-10-CM | POA: Diagnosis present

## 2015-07-31 DIAGNOSIS — S299XXA Unspecified injury of thorax, initial encounter: Secondary | ICD-10-CM | POA: Diagnosis not present

## 2015-07-31 DIAGNOSIS — S72111A Displaced fracture of greater trochanter of right femur, initial encounter for closed fracture: Secondary | ICD-10-CM | POA: Diagnosis not present

## 2015-07-31 DIAGNOSIS — K5900 Constipation, unspecified: Secondary | ICD-10-CM | POA: Diagnosis not present

## 2015-07-31 DIAGNOSIS — F411 Generalized anxiety disorder: Secondary | ICD-10-CM | POA: Diagnosis not present

## 2015-07-31 DIAGNOSIS — I2510 Atherosclerotic heart disease of native coronary artery without angina pectoris: Secondary | ICD-10-CM | POA: Diagnosis not present

## 2015-07-31 DIAGNOSIS — W1812XA Fall from or off toilet with subsequent striking against object, initial encounter: Secondary | ICD-10-CM | POA: Diagnosis not present

## 2015-07-31 DIAGNOSIS — R293 Abnormal posture: Secondary | ICD-10-CM | POA: Diagnosis not present

## 2015-07-31 DIAGNOSIS — Z7982 Long term (current) use of aspirin: Secondary | ICD-10-CM | POA: Diagnosis not present

## 2015-07-31 DIAGNOSIS — M79601 Pain in right arm: Secondary | ICD-10-CM | POA: Diagnosis not present

## 2015-07-31 DIAGNOSIS — M797 Fibromyalgia: Secondary | ICD-10-CM | POA: Diagnosis present

## 2015-07-31 DIAGNOSIS — S72352A Displaced comminuted fracture of shaft of left femur, initial encounter for closed fracture: Secondary | ICD-10-CM | POA: Diagnosis not present

## 2015-07-31 DIAGNOSIS — Z9181 History of falling: Secondary | ICD-10-CM | POA: Diagnosis not present

## 2015-07-31 DIAGNOSIS — M1990 Unspecified osteoarthritis, unspecified site: Secondary | ICD-10-CM | POA: Diagnosis not present

## 2015-07-31 DIAGNOSIS — S79911A Unspecified injury of right hip, initial encounter: Secondary | ICD-10-CM | POA: Diagnosis not present

## 2015-07-31 DIAGNOSIS — E039 Hypothyroidism, unspecified: Secondary | ICD-10-CM | POA: Diagnosis present

## 2015-07-31 DIAGNOSIS — Z96651 Presence of right artificial knee joint: Secondary | ICD-10-CM | POA: Diagnosis present

## 2015-07-31 DIAGNOSIS — S72141A Displaced intertrochanteric fracture of right femur, initial encounter for closed fracture: Principal | ICD-10-CM | POA: Diagnosis present

## 2015-07-31 DIAGNOSIS — Z4789 Encounter for other orthopedic aftercare: Secondary | ICD-10-CM | POA: Diagnosis not present

## 2015-07-31 DIAGNOSIS — M79651 Pain in right thigh: Secondary | ICD-10-CM | POA: Diagnosis not present

## 2015-07-31 DIAGNOSIS — S72091A Other fracture of head and neck of right femur, initial encounter for closed fracture: Secondary | ICD-10-CM | POA: Diagnosis not present

## 2015-07-31 DIAGNOSIS — F329 Major depressive disorder, single episode, unspecified: Secondary | ICD-10-CM | POA: Diagnosis not present

## 2015-07-31 DIAGNOSIS — K219 Gastro-esophageal reflux disease without esophagitis: Secondary | ICD-10-CM | POA: Diagnosis present

## 2015-07-31 DIAGNOSIS — F17200 Nicotine dependence, unspecified, uncomplicated: Secondary | ICD-10-CM | POA: Diagnosis not present

## 2015-07-31 DIAGNOSIS — S79921A Unspecified injury of right thigh, initial encounter: Secondary | ICD-10-CM | POA: Diagnosis not present

## 2015-07-31 DIAGNOSIS — M6281 Muscle weakness (generalized): Secondary | ICD-10-CM | POA: Diagnosis not present

## 2015-07-31 DIAGNOSIS — W1811XA Fall from or off toilet without subsequent striking against object, initial encounter: Secondary | ICD-10-CM | POA: Diagnosis present

## 2015-07-31 DIAGNOSIS — I69354 Hemiplegia and hemiparesis following cerebral infarction affecting left non-dominant side: Secondary | ICD-10-CM

## 2015-07-31 DIAGNOSIS — Z800 Family history of malignant neoplasm of digestive organs: Secondary | ICD-10-CM

## 2015-07-31 DIAGNOSIS — E038 Other specified hypothyroidism: Secondary | ICD-10-CM

## 2015-07-31 DIAGNOSIS — S72009A Fracture of unspecified part of neck of unspecified femur, initial encounter for closed fracture: Secondary | ICD-10-CM | POA: Diagnosis present

## 2015-07-31 DIAGNOSIS — S59911A Unspecified injury of right forearm, initial encounter: Secondary | ICD-10-CM | POA: Diagnosis not present

## 2015-07-31 DIAGNOSIS — R262 Difficulty in walking, not elsewhere classified: Secondary | ICD-10-CM | POA: Diagnosis not present

## 2015-07-31 DIAGNOSIS — R079 Chest pain, unspecified: Secondary | ICD-10-CM | POA: Diagnosis not present

## 2015-07-31 DIAGNOSIS — M25551 Pain in right hip: Secondary | ICD-10-CM | POA: Diagnosis not present

## 2015-07-31 LAB — URINALYSIS, ROUTINE W REFLEX MICROSCOPIC
BILIRUBIN URINE: NEGATIVE
Glucose, UA: NEGATIVE mg/dL
Ketones, ur: NEGATIVE mg/dL
LEUKOCYTES UA: NEGATIVE
NITRITE: NEGATIVE
PH: 6 (ref 5.0–8.0)
Protein, ur: NEGATIVE mg/dL

## 2015-07-31 LAB — CBC WITH DIFFERENTIAL/PLATELET
Basophils Absolute: 0 10*3/uL (ref 0.0–0.1)
Basophils Relative: 1 %
EOS ABS: 0 10*3/uL (ref 0.0–0.7)
EOS PCT: 1 %
HCT: 34.4 % — ABNORMAL LOW (ref 36.0–46.0)
Hemoglobin: 12.5 g/dL (ref 12.0–15.0)
LYMPHS ABS: 1.1 10*3/uL (ref 0.7–4.0)
LYMPHS PCT: 27 %
MCH: 33.5 pg (ref 26.0–34.0)
MCHC: 36.3 g/dL — ABNORMAL HIGH (ref 30.0–36.0)
MCV: 92.2 fL (ref 78.0–100.0)
MONO ABS: 0.7 10*3/uL (ref 0.1–1.0)
Monocytes Relative: 15 %
Neutro Abs: 2.4 10*3/uL (ref 1.7–7.7)
Neutrophils Relative %: 56 %
PLATELETS: 246 10*3/uL (ref 150–400)
RBC: 3.73 MIL/uL — AB (ref 3.87–5.11)
RDW: 13.2 % (ref 11.5–15.5)
WBC: 4.2 10*3/uL (ref 4.0–10.5)

## 2015-07-31 LAB — PREPARE RBC (CROSSMATCH)

## 2015-07-31 LAB — BASIC METABOLIC PANEL
Anion gap: 7 (ref 5–15)
BUN: 17 mg/dL (ref 6–20)
CALCIUM: 8.4 mg/dL — AB (ref 8.9–10.3)
CO2: 25 mmol/L (ref 22–32)
Chloride: 97 mmol/L — ABNORMAL LOW (ref 101–111)
Creatinine, Ser: 0.57 mg/dL (ref 0.44–1.00)
GFR calc Af Amer: >60 mL/min (ref >60)
GFR calc non Af Amer: >60 mL/min (ref >60)
GLUCOSE: 106 mg/dL — AB (ref 65–99)
POTASSIUM: 4.3 mmol/L (ref 3.5–5.1)
SODIUM: 129 mmol/L — AB (ref 135–145)

## 2015-07-31 LAB — URINE MICROSCOPIC-ADD ON

## 2015-07-31 LAB — ABO/RH: ABO/RH(D): O POS

## 2015-07-31 LAB — TSH: TSH: 1.238 u[IU]/mL (ref 0.350–4.500)

## 2015-07-31 LAB — SURGICAL PCR SCREEN
MRSA, PCR: NEGATIVE
Staphylococcus aureus: NEGATIVE

## 2015-07-31 MED ORDER — DIAZEPAM 2 MG PO TABS
2.0000 mg | ORAL_TABLET | Freq: Once | ORAL | Status: AC
  Administered 2015-07-31: 2 mg via ORAL
  Filled 2015-07-31: qty 1

## 2015-07-31 MED ORDER — LEVOTHYROXINE SODIUM 88 MCG PO TABS
88.0000 ug | ORAL_TABLET | Freq: Every day | ORAL | Status: DC
  Administered 2015-08-02 – 2015-08-04 (×3): 88 ug via ORAL
  Filled 2015-07-31 (×3): qty 1

## 2015-07-31 MED ORDER — DILTIAZEM HCL ER COATED BEADS 120 MG PO CP24: 120.0000 mg | ORAL_CAPSULE | Freq: Every day | ORAL | Status: DC

## 2015-07-31 MED ORDER — SODIUM CHLORIDE 0.9 % IV SOLN: Freq: Once | INTRAVENOUS | Status: DC

## 2015-07-31 MED ORDER — FENTANYL CITRATE (PF) 100 MCG/2ML IJ SOLN
25.0000 ug | INTRAMUSCULAR | Status: DC | PRN
  Administered 2015-07-31 – 2015-08-01 (×3): 25 ug via INTRAVENOUS
  Filled 2015-07-31 (×3): qty 2

## 2015-07-31 MED ORDER — DEXTROSE-NACL 5-0.9 % IV SOLN
INTRAVENOUS | Status: DC
  Administered 2015-07-31 – 2015-08-01 (×2): via INTRAVENOUS

## 2015-07-31 MED ORDER — ONDANSETRON HCL 4 MG/2ML IJ SOLN: 4.0000 mg | Freq: Four times a day (QID) | INTRAMUSCULAR | Status: DC | PRN

## 2015-07-31 MED ORDER — ISOSORBIDE MONONITRATE ER 30 MG PO TB24
15.0000 mg | ORAL_TABLET | Freq: Every day | ORAL | Status: DC
  Administered 2015-07-31 – 2015-08-04 (×4): 15 mg via ORAL
  Filled 2015-07-31 (×4): qty 1

## 2015-07-31 MED ORDER — ACETAMINOPHEN 325 MG PO TABS: 650.0000 mg | ORAL_TABLET | Freq: Four times a day (QID) | ORAL | Status: DC | PRN

## 2015-07-31 MED ORDER — NIACIN 500 MG PO TABS
500.0000 mg | ORAL_TABLET | Freq: Once | ORAL | Status: DC
  Filled 2015-07-31: qty 1

## 2015-07-31 MED ORDER — DIPHENHYDRAMINE HCL 25 MG PO CAPS
25.0000 mg | ORAL_CAPSULE | Freq: Once | ORAL | Status: AC
  Administered 2015-07-31: 25 mg via ORAL
  Filled 2015-07-31: qty 1

## 2015-07-31 MED ORDER — KETOROLAC TROMETHAMINE 60 MG/2ML IM SOLN
30.0000 mg | Freq: Once | INTRAMUSCULAR | Status: AC
  Administered 2015-07-31: 30 mg via INTRAMUSCULAR
  Filled 2015-07-31: qty 2

## 2015-07-31 MED ORDER — ACETAMINOPHEN 650 MG RE SUPP: 650.0000 mg | Freq: Four times a day (QID) | RECTAL | Status: DC | PRN

## 2015-07-31 MED ORDER — DULOXETINE HCL 60 MG PO CPEP
60.0000 mg | ORAL_CAPSULE | Freq: Every day | ORAL | Status: DC
  Administered 2015-08-02 – 2015-08-04 (×3): 60 mg via ORAL
  Filled 2015-07-31 (×3): qty 1

## 2015-07-31 MED ORDER — PANTOPRAZOLE SODIUM 40 MG PO TBEC
40.0000 mg | DELAYED_RELEASE_TABLET | Freq: Every day | ORAL | Status: DC
  Administered 2015-07-31 – 2015-08-04 (×4): 40 mg via ORAL
  Filled 2015-07-31 (×4): qty 1

## 2015-07-31 MED ORDER — ONDANSETRON HCL 4 MG PO TABS: 4.0000 mg | ORAL_TABLET | Freq: Four times a day (QID) | ORAL | Status: DC | PRN

## 2015-07-31 MED ORDER — DILTIAZEM HCL ER COATED BEADS 120 MG PO CP24
120.0000 mg | ORAL_CAPSULE | Freq: Every day | ORAL | Status: DC
  Administered 2015-07-31 – 2015-08-04 (×4): 120 mg via ORAL
  Filled 2015-07-31 (×5): qty 1

## 2015-07-31 MED ORDER — HEPARIN SODIUM (PORCINE) 5000 UNIT/ML IJ SOLN
5000.0000 [IU] | Freq: Three times a day (TID) | INTRAMUSCULAR | Status: DC
  Administered 2015-07-31: 5000 [IU] via SUBCUTANEOUS
  Filled 2015-07-31 (×2): qty 1

## 2015-07-31 MED ORDER — ASPIRIN EC 81 MG PO TBEC
81.0000 mg | DELAYED_RELEASE_TABLET | ORAL | Status: DC
  Filled 2015-07-31: qty 1

## 2015-07-31 MED ORDER — NICOTINE 14 MG/24HR TD PT24
14.0000 mg | MEDICATED_PATCH | Freq: Every day | TRANSDERMAL | Status: DC
Start: 2015-07-31 — End: 2015-08-04
  Administered 2015-07-31 – 2015-08-04 (×5): 14 mg via TRANSDERMAL
  Filled 2015-07-31 (×6): qty 1

## 2015-07-31 NOTE — ED Notes (Signed)
[place] and was seen at hospital at the [facility]).  All Xrays were negative.  C/o constant pain to right leg and right elbow.  Rates pain 4/10.

## 2015-07-31 NOTE — H&P (Signed)
[facility] History and Physical  SHANYCE KORSMO [id number] DOB: 01-Feb-1936    PCP:   Alonza Bogus, MD   Chief Complaint: right hip pain after fall 5 days ago.   HPI: [name] is an 80 y.o. female with hx of HTN, hypothyroidism, PVD, carotid disease, GERD, fell 5 days ago getting off her commode, seen at OSH with negative xrays, presented to the ER as she has more pain today.  Work up in the ED today with CT scan showed comminuted right greater trochanteric Fx with intertrochanteric extension.  Her serology was unremarkable with normal renal fx test and Hb of 12.5.  Her Na was low at 129.  Dr Aline Brochure of orthopedics was consulted, and planned ORIF tomorrow.  Hospitalist was asked to admit her for same.   Rewiew of Systems:  Constitutional: Negative for malaise, fever and chills. No significant weight loss or weight gain Eyes: Negative for eye pain, redness and discharge, diplopia, visual changes, or flashes of light. ENMT: Negative for ear pain, hoarseness, nasal congestion, sinus pressure and sore throat. No headaches; tinnitus, drooling, or problem swallowing. Cardiovascular: Negative for chest pain, palpitations, diaphoresis, dyspnea and peripheral edema. ; No orthopnea, PND Respiratory: Negative for cough, hemoptysis, wheezing and stridor. No pleuritic chestpain. Gastrointestinal: Negative for nausea, vomiting, diarrhea, constipation, abdominal pain, melena, blood in stool, hematemesis, jaundice and rectal bleeding.    Genitourinary: Negative for frequency, dysuria, incontinence,flank pain and hematuria; Musculoskeletal: Negative for back pain and neck pain. Negative for swelling  Skin: . Negative for pruritus, rash, abrasions, bruising and skin lesion.; ulcerations Neuro: Negative for headache, lightheadedness and neck stiffness. Negative for weakness, altered level of consciousness , altered mental status, extremity weakness, burning feet, involuntary movement,  seizure and syncope.  Psych: negative for anxiety, depression, insomnia, tearfulness, panic attacks, hallucinations, paranoia, suicidal or homicidal ideation    Past Medical History  Diagnosis Date  . Arthritis   . Hypertension   . PONV (postoperative nausea and vomiting)     severe n/v after every surgery  . Hypothyroidism   . Hypothyroid   . Peripheral vascular disease ([place])   . Osteoarthritis   . GERD (gastroesophageal reflux disease)   . Constipation due to pain medication   . COPD (chronic obstructive pulmonary disease) ([place])   . Depression   . Gastritis   . Diverticulosis   . Fibromyalgia   . Stroke ([place])   . Shortness of breath dyspnea   . Pneumonia   . Anxiety     Past Surgical History  Procedure Laterality Date  . Total knee arthroplasty    . Carotid stent    . Shoulder surgery    . Abdominal hysterectomy    . Cholecystectomy    . Back surgery    . Rotator cuff repair    . Eye surgery      cataract /lens both eyes  . Vascular surgery    . Joint replacement  [id number]    knees  . Anterior cervical decomp/discectomy fusion N/A 04/03/2012    Procedure: ANTERIOR CERVICAL DECOMPRESSION/DISCECTOMY FUSION 2 LEVELS;  Surgeon: Hosie Spangle, MD;  Location: [facility];  Service: Neurosurgery;  Laterality: N/A;  Cervical four-five,Cervical five-six anterior cervical decompression with fusion plating and bonegraft  . Cholecystectomy    . Colonoscopy N/A 07/15/2014    Procedure: COLONOSCOPY;  Surgeon: Rogene Houston, MD;  [facility]: [facility];  Service: Endoscopy;  Laterality: N/A;  200 - moved to 2:10 - Ann to notify  pt  . Esophagogastroduodenoscopy N/A 07/15/2014    Procedure: ESOPHAGOGASTRODUODENOSCOPY (EGD);  Surgeon: Rogene Houston, MD;  Location: [facility];  Service: Endoscopy;  Laterality: N/A;  . Peripheral vascular catheterization N/A 10/08/2014    Procedure: Visceral Angiography;  Surgeon: Algernon Huxley, MD;  Location: [place];  Service:  Cardiovascular;  Laterality: N/A;  . Peripheral vascular catheterization N/A 10/08/2014    Procedure: Visceral Artery Intervention;  Surgeon: Algernon Huxley, MD;  Location: [place] [facility];  Service: Cardiovascular;  Laterality: N/A;    Medications:  HOME MEDS: Prior to Admission medications   Medication Sig Start Date End Date Taking? Authorizing Provider  Ascorbic Acid (VITAMIN C) 1000 MG tablet Take 1,000 mg by mouth 2 (two) times daily.     Historical Provider, MD  aspirin EC 81 MG tablet Take 81 mg by mouth every other day.     Historical Provider, MD  B Complex Vitamins (VITAMIN-B COMPLEX PO) Take 1,000 mcg by mouth daily.     Historical Provider, MD  Biotin 1000 MCG tablet Take 500 mcg by mouth daily.     Historical Provider, MD  CALCIUM-VITAMIN D PO Take 2 capsules by mouth daily. Calcium 1200mg  + Vitamin D (unknown strength)    Historical Provider, MD  DILT-XR 120 MG 24 hr capsule Take 120 mg by mouth daily.  09/03/14   Historical Provider, MD  DULoxetine (CYMBALTA) 60 MG capsule Take 60 mg by mouth daily.    Historical Provider, MD  Flaxseed, Linseed, (FLAX SEED OIL) 1000 MG CAPS Take 1 capsule by mouth daily.     Historical Provider, MD  isosorbide mononitrate (IMDUR) 30 MG 24 hr tablet Take 0.5 tablets (15 mg total) by mouth daily. 09/17/14   Sinda Du, MD  levothyroxine (SYNTHROID, LEVOTHROID) 88 MCG tablet Take 1 tablet (88 mcg total) by mouth daily. 03/12/13   Bary Leriche, PA-C  metoCLOPramide (REGLAN) 5 MG tablet Take 1 tablet (5 mg total) by mouth 3 (three) times daily before meals. 09/17/14   Sinda Du, MD  Multiple Vitamin (MULTIVITAMIN WITH MINERALS) TABS Take 1 tablet by mouth daily.    Historical Provider, MD  niacin 500 MG tablet Take 500 mg by mouth at bedtime.    Historical Provider, MD  nicotine (NICODERM CQ - DOSED IN MG/24 HOURS) 14 mg/24hr patch Place 1 patch (14 mg total) onto the skin daily. 09/17/14   Sinda Du, MD  ondansetron (ZOFRAN) 4 MG tablet  Take 1 tablet (4 mg total) by mouth 2 (two) times daily. 09/07/14   Rogene Houston, MD  pantoprazole (PROTONIX) 40 MG tablet Take 40 mg by mouth daily.    Historical Provider, MD  polyethylene glycol (MIRALAX / GLYCOLAX) packet Take 17 g by mouth every other day.    Historical Provider, MD     Allergies:  Allergies  Allergen Reactions  . Aleve [Naproxen Sodium] Hives and Palpitations  . Wheat Bran     Gluten intolerant  . Celebrex [Celecoxib] Nausea And Vomiting  . Codeine Nausea And Vomiting  . Morphine And Related Nausea And Vomiting    Social History:   reports that she has been smoking Cigarettes.  She has a 22.5 pack-year smoking history. She uses smokeless tobacco. She reports that she does not drink alcohol or use illicit drugs.  Family History: Family History  Problem Relation Age of Onset  . Pancreatic cancer Mother   . Prostate cancer Father      Physical Exam:  Filed Vitals:   07/31/15 1020  BP: 131/90  Pulse: 69  Temp: 98.4 F (36.9 C)  TempSrc: Oral  Height: 5' 1.5" (1.562 m)  Weight: 51.256 kg (113 lb)  SpO2: 93%   Blood pressure 131/90, pulse 69, temperature 98.4 F (36.9 C), temperature source Oral, height 5' 1.5" (1.562 m), weight 51.256 kg (113 lb), SpO2 93 %.  GEN:  Pleasant  patient lying in the stretcher in no acute distress; cooperative with exam. PSYCH:  alert and oriented x4; does not appear anxious or depressed; affect is appropriate. HEENT: Mucous membranes pink and anicteric; PERRLA; EOM intact; no cervical lymphadenopathy nor thyromegaly or carotid bruit; no JVD; There were no stridor. Neck is very supple. Breasts:: Not examined CHEST WALL: No tenderness CHEST: Normal respiration, clear to auscultation bilaterally.  HEART: Regular rate and rhythm.  There are no murmur, rub, or gallops.   BACK: No kyphosis or scoliosis; no CVA tenderness ABDOMEN: soft and non-tender; no masses, no organomegaly, normal abdominal bowel sounds; no pannus; no  intertriginous candida. There is no rebound and no distention. Rectal Exam: Not done EXTREMITIES: No bone or joint deformity; age-appropriate arthropathy of the hands and knees; no edema; no ulcerations.  There is no calf tenderness. Genitalia: not examined PULSES: 2+ and symmetric SKIN: Normal hydration no rash or ulceration CNS: Cranial nerves 2-12 grossly intact no focal lateralizing neurologic deficit.  Speech is fluent; uvula elevated with phonation, facial symmetry and tongue midline. DTR are normal bilaterally, cerebella exam is intact, barbinski is negative and strengths are equaled bilaterally.  No sensory loss.   Labs on Admission:  Basic Metabolic Panel:  Recent Labs Lab 07/31/15 1214  NA 129*  K 4.3  CL 97*  CO2 25  GLUCOSE 106*  BUN 17  CREATININE 0.57  CALCIUM 8.4*   CBC:  Recent Labs Lab 07/31/15 1214  WBC 4.2  NEUTROABS 2.4  HGB 12.5  HCT 34.4*  MCV 92.2  PLT 246     Radiological Exams on Admission: Dg Chest 2 View  07/31/2015  CLINICAL DATA:  Pain after fall. EXAM: CHEST  2 VIEW COMPARISON:  March 29, 2012 FINDINGS: The heart size and mediastinal contours are within normal limits. Both lungs are clear. The visualized skeletal structures are unremarkable. IMPRESSION: No active cardiopulmonary disease. Electronically Signed   By: Dorise Bullion III M.D   On: 07/31/2015 11:10   Dg Forearm Right  07/31/2015  CLINICAL DATA:  Pain after fall. EXAM: RIGHT FOREARM - 2 VIEW COMPARISON:  None. FINDINGS: Degenerative changes in the right elbow. No fracture or dislocation identified. IMPRESSION: Negative. Electronically Signed   By: Dorise Bullion III M.D   On: 07/31/2015 11:12   Ct Pelvis Wo Contrast  07/31/2015  CLINICAL DATA:  [place] 5 days ago.  Right-sided pain persists. EXAM: CT PELVIS WITHOUT CONTRAST TECHNIQUE: Multidetector CT imaging of the pelvis was performed following the standard protocol without intravenous contrast. COMPARISON:  Plain films  including earlier today.  CT of 10/22/2014 FINDINGS: Soft tissues: Large colonic stool burden. Extensive colonic diverticulosis. Advanced aortic and branch vessel atherosclerosis. Hysterectomy. Marked pelvic floor laxity with resultant apparent soft tissue fullness within the pelvic floor. No free intraperitoneal air. No significant free fluid. No soft tissue hematoma. Bones: Mild osteopenia. Femoral heads are located. Comminuted fracture involving the greater trochanter of the right femur. Subtle extension into the intratrochanteric region is suspected, including on image 54/series 7. Degenerative disc disease involves the L4-5 level. IMPRESSION: 1. Comminuted fracture involving the  greater trochanter of the right femur with probable intertrochanteric extension. 2.  Possible constipation. 3. Pelvic floor laxity 4. Advanced atherosclerosis. Electronically Signed   By: Abigail Miyamoto M.D.   On: 07/31/2015 13:23   Dg Humerus Right  07/31/2015  CLINICAL DATA:  Pain after fall EXAM: RIGHT HUMERUS - 2+ VIEW COMPARISON:  None. FINDINGS: There is an unusual configuration to the right humeral head, not seen on arthrogram from 2012. Calcification adjacent to the rotator cuff insertion site is consistent with calcific tendinosis. The remainder of the humerus is normal. Minimal irregularity of the radial head, not seen on the forearm films. No other abnormalities. No definite joint effusion in the elbow. IMPRESSION: 1. Unusual configuration to the right humeral head. This could represent an age-indeterminate fracture. If there is concern for acute fracture in this region today, recommend dedicated images of the shoulder. 2. Mild irregularity of the radial head, not seen on the forearm films. The lack of a joint effusion in the elbow is reassuring. Recommend clinical correlation to exclude point tenderness in this location. Electronically Signed   By: Dorise Bullion III M.D   On: 07/31/2015 11:18   Dg Hip Unilat With Pelvis  2-3 Views Right  07/31/2015  CLINICAL DATA:  [place], right hip and right femur pain EXAM: DG HIP (WITH OR WITHOUT PELVIS) 2-3V RIGHT COMPARISON:  None. FINDINGS: Three views of the right hip submitted. No acute fracture or subluxation. The diffuse osteopenia. Mild degenerative changes are noted bilateral hip joints with mild superior acetabular spurring and mild narrowing of superior hip joint space. Mild degenerative changes pubic symphysis. Degenerative changes are noted with lower lumbar spine. IMPRESSION: No acute fracture or subluxation. Diffuse osteopenia. Degenerative changes as described above Electronically Signed   By: Lahoma Crocker M.D.   On: 07/31/2015 11:12   Dg Femur, Min 2 Views Right  07/31/2015  CLINICAL DATA:  Pain after fall. EXAM: RIGHT FEMUR 2 VIEWS COMPARISON:  None. FINDINGS: The patient is status post right knee replacement. Hardware is in good position. No fractures or dislocations. Vascular calcifications are seen. IMPRESSION: No acute abnormalities. Electronically Signed   By: Dorise Bullion III M.D   On: 07/31/2015 11:11    EKG: Independently reviewed.    Assessment/Plan Present on Admission:  . Hip fracture ([address]) . Essential hypertension . Tobacco use disorder . Carotid artery disease ([place]) . Hypothyroidism  PLAN:   Right hip Fx:  Will admit to gen meds.  IV Fentanyl low dose.  Sub Q heparin.  Continue her home meds.  NPO after midnight for anticipated surgery tomorrow.  Dr Aline Brochure is aware.  Hyponatremia:  Mild.  Will repeat tomorrow.  Low dose IVF.  Hypovolemic hyponatremia.   Hypothyrodism:  Continue oral meds.  Check TSH.  Tobacco abuse:  Discussed.  HTN:  Stable.  Will continue with meds.   Other plans as per orders. Code Status: FULL Haskel Khan, MD. FACP [facility] Pager [phone] 7pm to 7am.  07/31/2015, 2:12 PM

## 2015-07-31 NOTE — ED Provider Notes (Signed)
CSN: [id number]     Arrival date & time 07/31/15  1005 History  By signing my name below, I, [facility], attest that this documentation has been prepared under the direction and in the presence of Merrily Pew, MD. Electronically Signed: Virgel Bouquet, ED Scribe. 07/31/2015. 11:46 AM.   Chief Complaint  Patient presents with  . Leg Pain   The history is provided by the patient. No language interpreter was used.   HPI Comments: [name] is a 80 y.o. female with a hx of stroke with permanent left-sided weakness, HTN, GERD, COPD, and fibromyalgia who presents to the Emergency Department complaining of constant, gradually worsening, moderate right leg pain onset 5 days ago after a fall.  Pt states that she was getting off of the toilet when she twisted her foot and fell landing on her right side 5 days ago, after which she was evaluated at the ED in [place], [place] where she received negative x-rays but was asymptomatic until the following morning when tearing, pain began which gradually worsened throughout the day. She reports associated difficulty ambulating secondary to pain and weakness in her right leg. Son notes that the pt was ambulatory without assistance at baseline but has had difficulty ambulating since the fall and requires assistance with both a walker and a person to help her. Denies striking her head. Denies any other symptoms currently.   Past Medical History  Diagnosis Date  . Arthritis   . Hypertension   . PONV (postoperative nausea and vomiting)     severe n/v after every surgery  . Hypothyroidism   . Hypothyroid   . Peripheral vascular disease ([place])   . Osteoarthritis   . GERD (gastroesophageal reflux disease)   . Constipation due to pain medication   . COPD (chronic obstructive pulmonary disease) ([place])   . Depression   . Gastritis   . Diverticulosis   . Fibromyalgia   . Stroke ([place])   . Shortness of breath dyspnea   . Pneumonia   . Anxiety     Past Surgical History  Procedure Laterality Date  . Total knee arthroplasty    . Carotid stent    . Shoulder surgery    . Abdominal hysterectomy    . Cholecystectomy    . Back surgery    . Rotator cuff repair    . Eye surgery      cataract /lens both eyes  . Vascular surgery    . Joint replacement  [id number]    knees  . Anterior cervical decomp/discectomy fusion N/A 04/03/2012    Procedure: ANTERIOR CERVICAL DECOMPRESSION/DISCECTOMY FUSION 2 LEVELS;  Surgeon: Hosie Spangle, MD;  Location: [place] [facility];  Service: Neurosurgery;  Laterality: N/A;  Cervical four-five,Cervical five-six anterior cervical decompression with fusion plating and bonegraft  . Cholecystectomy    . Colonoscopy N/A 07/15/2014    Procedure: COLONOSCOPY;  Surgeon: Rogene Houston, MD;  Location: [facility];  Service: Endoscopy;  Laterality: N/A;  200 - moved to 2:10 - Ann to notify pt  . Esophagogastroduodenoscopy N/A 07/15/2014    Procedure: ESOPHAGOGASTRODUODENOSCOPY (EGD);  Surgeon: Rogene Houston, MD;  Location: [facility];  Service: Endoscopy;  Laterality: N/A;  . Peripheral vascular catheterization N/A 10/08/2014    Procedure: Visceral Angiography;  Surgeon: Algernon Huxley, MD;  Location: [place] CV LAB;  Service: Cardiovascular;  Laterality: N/A;  . Peripheral vascular catheterization N/A 10/08/2014    Procedure: Visceral Artery Intervention;  Surgeon: Algernon Huxley, MD;  Location: [place] [facility] LAB;  Service: Cardiovascular;  Laterality: N/A;   Family History  Problem Relation Age of Onset  . Pancreatic cancer Mother   . Prostate cancer Father    Social History  Substance Use Topics  . Smoking status: Light Tobacco Smoker -- 0.50 packs/day for 45 years    Types: Cigarettes  . Smokeless tobacco: Current User     Comment:  Patient is using the E-Cigarettes  . Alcohol Use: No   OB History    No data available     Review of Systems  Musculoskeletal: Positive for myalgias.  Neurological:  Positive for weakness.  All other systems reviewed and are negative.    Allergies  Aleve; Wheat bran; Celebrex; Codeine; and Morphine and related  Home Medications   Prior to Admission medications   Medication Sig Start Date End Date Taking? Authorizing Provider  Ascorbic Acid (VITAMIN C) 1000 MG tablet Take 1,000 mg by mouth 2 (two) times daily.    Yes Historical Provider, MD  aspirin EC 81 MG tablet Take 81 mg by mouth every other day.    Yes Historical Provider, MD  B Complex Vitamins (VITAMIN-B COMPLEX PO) Take 1,000 mcg by mouth daily.    Yes Historical Provider, MD  Biotin 1000 MCG tablet Take 500 mcg by mouth daily.    Yes Historical Provider, MD  CALCIUM-VITAMIN D PO Take 2 capsules by mouth daily. Calcium 1200mg  + Vitamin D (unknown strength)   Yes Historical Provider, MD  DILT-XR 120 MG 24 hr capsule Take 120 mg by mouth daily.  09/03/14  Yes Historical Provider, MD  diphenhydrAMINE (SOMINEX) 25 MG tablet Take 25 mg by mouth at bedtime as needed for sleep.   Yes Historical Provider, MD  DULoxetine (CYMBALTA) 60 MG capsule Take 60 mg by mouth daily.   Yes Historical Provider, MD  Flaxseed, Linseed, (FLAX SEED OIL) 1000 MG CAPS Take 1 capsule by mouth daily.    Yes Historical Provider, MD  levothyroxine (SYNTHROID, LEVOTHROID) 88 MCG tablet Take 1 tablet (88 mcg total) by mouth daily. 03/12/13  Yes Pamela S Love, PA-C  MEGARED OMEGA-3 KRILL OIL PO Take 1 tablet by mouth daily.   Yes Historical Provider, MD  Multiple Vitamin (MULTIVITAMIN WITH MINERALS) TABS Take 1 tablet by mouth daily.   Yes Historical Provider, MD  niacin 500 MG tablet Take 500 mg by mouth at bedtime.   Yes Historical Provider, MD  polyethylene glycol (MIRALAX / GLYCOLAX) packet Take 17 g by mouth every other day.   Yes Historical Provider, MD   BP 142/63 mmHg  Pulse 83  Temp(Src) 98.4 F (36.9 C) (Oral)  Resp 18  Ht 5' 1.5" (1.562 m)  Wt 113 lb (51.256 kg)  BMI 21.01 kg/m2  SpO2 93% Physical Exam   Constitutional: She is oriented to person, place, and time. She appears well-developed and well-nourished. No distress.  HENT:  Head: Normocephalic and atraumatic.  Eyes: Conjunctivae are normal.  Neck: Normal range of motion.  Cardiovascular: Normal rate.   Pulmonary/Chest: Effort normal. No respiratory distress.  Musculoskeletal: Normal range of motion. She exhibits tenderness.  Tenderness of posterior right thigh. Decreased strength with flexion and extension of right hip.  Neurological: She is alert and oriented to person, place, and time.  Skin: Skin is warm and dry.  Psychiatric: She has a normal mood and affect. Her behavior is normal.  Nursing note and vitals reviewed.   ED Course  Procedures   DIAGNOSTIC STUDIES: Oxygen Saturation  is 93% on RA, adequate by my interpretation.    COORDINATION OF CARE: 11:40 AM Discussed results of x-rays. Will order CT scan of hip. Will provide pt with referral for a physical therapist. Advised pt to follow-up with physical therapist.Discussed treatment plan with pt at bedside and pt agreed to plan.    Imaging Review Dg Chest 2 View  07/31/2015  CLINICAL DATA:  Pain after fall. EXAM: CHEST  2 VIEW COMPARISON:  March 29, 2012 FINDINGS: The heart size and mediastinal contours are within normal limits. Both lungs are clear. The visualized skeletal structures are unremarkable. IMPRESSION: No active cardiopulmonary disease. Electronically Signed   By: Dorise Bullion III M.D   On: 07/31/2015 11:10   Dg Forearm Right  07/31/2015  CLINICAL DATA:  Pain after fall. EXAM: RIGHT FOREARM - 2 VIEW COMPARISON:  None. FINDINGS: Degenerative changes in the right elbow. No fracture or dislocation identified. IMPRESSION: Negative. Electronically Signed   By: Dorise Bullion III M.D   On: 07/31/2015 11:12   Ct Pelvis Wo Contrast  07/31/2015  CLINICAL DATA:  [place] 5 days ago.  Right-sided pain persists. EXAM: CT PELVIS WITHOUT CONTRAST TECHNIQUE:  Multidetector CT imaging of the pelvis was performed following the standard protocol without intravenous contrast. COMPARISON:  Plain films including earlier today.  CT of 10/22/2014 FINDINGS: Soft tissues: Large colonic stool burden. Extensive colonic diverticulosis. Advanced aortic and branch vessel atherosclerosis. Hysterectomy. Marked pelvic floor laxity with resultant apparent soft tissue fullness within the pelvic floor. No free intraperitoneal air. No significant free fluid. No soft tissue hematoma. Bones: Mild osteopenia. Femoral heads are located. Comminuted fracture involving the greater trochanter of the right femur. Subtle extension into the intratrochanteric region is suspected, including on image 54/series 7. Degenerative disc disease involves the L4-5 level. IMPRESSION: 1. Comminuted fracture involving the greater trochanter of the right femur with probable intertrochanteric extension. 2.  Possible constipation. 3. Pelvic floor laxity 4. Advanced atherosclerosis. Electronically Signed   By: Abigail Miyamoto M.D.   On: 07/31/2015 13:23   Dg Humerus Right  07/31/2015  CLINICAL DATA:  Pain after fall EXAM: RIGHT HUMERUS - 2+ VIEW COMPARISON:  None. FINDINGS: There is an unusual configuration to the right humeral head, not seen on arthrogram from 2012. Calcification adjacent to the rotator cuff insertion site is consistent with calcific tendinosis. The remainder of the humerus is normal. Minimal irregularity of the radial head, not seen on the forearm films. No other abnormalities. No definite joint effusion in the elbow. IMPRESSION: 1. Unusual configuration to the right humeral head. This could represent an age-indeterminate fracture. If there is concern for acute fracture in this region today, recommend dedicated images of the shoulder. 2. Mild irregularity of the radial head, not seen on the forearm films. The lack of a joint effusion in the elbow is reassuring. Recommend clinical correlation to exclude  point tenderness in this location. Electronically Signed   By: Dorise Bullion III M.D   On: 07/31/2015 11:18   Dg Hip Unilat With Pelvis 2-3 Views Right  07/31/2015  CLINICAL DATA:  [place], right hip and right femur pain EXAM: DG HIP (WITH OR WITHOUT PELVIS) 2-3V RIGHT COMPARISON:  None. FINDINGS: Three views of the right hip submitted. No acute fracture or subluxation. The diffuse osteopenia. Mild degenerative changes are noted bilateral hip joints with mild superior acetabular spurring and mild narrowing of superior hip joint space. Mild degenerative changes pubic symphysis. Degenerative changes are noted with lower lumbar  spine. IMPRESSION: No acute fracture or subluxation. Diffuse osteopenia. Degenerative changes as described above Electronically Signed   By: Lahoma Crocker M.D.   On: 07/31/2015 11:12   Dg Femur, Min 2 Views Right  07/31/2015  CLINICAL DATA:  Pain after fall. EXAM: RIGHT FEMUR 2 VIEWS COMPARISON:  None. FINDINGS: The patient is status post right knee replacement. Hardware is in good position. No fractures or dislocations. Vascular calcifications are seen. IMPRESSION: No acute abnormalities. Electronically Signed   By: Dorise Bullion III M.D   On: 07/31/2015 11:11   I have personally reviewed and evaluated these images and lab results as part of my medical decision-making.   EKG Interpretation None      MDM   Final diagnoses:  Hip fracture, right, closed, initial encounter ([place])    A fall a few days ago progressively worsening right hip pain since that time. Tenderness to any range of motion of that hip. No obvious deformities on exam. X-rays were negative however patient is unable to bear weight without sniffing paint a CT scan done which showed trochanter fracture with intertrochanteric extension. Discussed this with orthopedics, Dr. Aline Brochure, who says this is a surgical fracture. I discussed it with medicine who will admit for planned surgery tomorrow.    I personally performed the services described in this documentation, which was scribed in my presence. The recorded information has been reviewed and is accurate.    Merrily Pew, MD 07/31/15 1550

## 2015-07-31 NOTE — Progress Notes (Signed)
Patient ID: [name], female   DOB: [birth year], 80 y.o.   MRN: [id number] Preoperative note  Diagnosis right basicervical intertroch equivalent hip fracture   Plan surgical treatment compression hip screw  CBC  CBC Latest Ref Rng 07/31/2015 10/21/2014 09/16/2014  WBC 4.0 - 10.5 K/uL 4.2 18.8(H) 13.9(H)  Hemoglobin 12.0 - 15.0 g/dL 12.5 13.8 9.8(L)  Hematocrit 36.0 - 46.0 % 34.4(L) 40.6 29.1(L)  Platelets 150 - 400 K/uL 246 458(H) 405(H)      BASIC metabolic panel BMP Latest Ref Rng 07/31/2015 10/21/2014 10/08/2014  Glucose 65 - 99 mg/dL 106(H) 163(H) -  BUN 6 - 20 mg/dL 17 26(H) 17  Creatinine 0.44 - 1.00 mg/dL 0.57 0.63 0.87  Sodium 135 - 145 mmol/L 129(L) 135 -  Potassium 3.5 - 5.1 mmol/L 4.3 4.0 -  Chloride 101 - 111 mmol/L 97(L) 99(L) -  CO2 22 - 32 mmol/L 25 25 -  Calcium 8.9 - 10.3 mg/dL 8.4(L) 8.8(L) -    BLOOD UNITS AVAILABLE pnding  Plain film findings  CT near complete fracture of the right basicervical area of the hip

## 2015-07-31 NOTE — Progress Notes (Signed)
Pt. Arrived to unit rm 335 in stable condition.  Dr. Marin Comment notified.  Report received from [facility] ED, RN.

## 2015-07-31 NOTE — Progress Notes (Signed)
CM able to talk with patient and family in the ED to work on [facility] services.  Patient pending admission to hospital.  Pending rehab services and also patient and family requests for smaller size wheelchair.  Home Health services placed on hold and does not need the rolling walker at this time.  Social Worker consult to be placed once admitted.

## 2015-08-01 ENCOUNTER — Inpatient Hospital Stay (HOSPITAL_COMMUNITY): Payer: Medicare Other

## 2015-08-01 ENCOUNTER — Inpatient Hospital Stay (HOSPITAL_COMMUNITY): Payer: Medicare Other | Admitting: Anesthesiology

## 2015-08-01 ENCOUNTER — Encounter (HOSPITAL_COMMUNITY)
Admission: EM | Disposition: A | Discharge status: Skilled Nursing Facility | Payer: Self-pay | Source: Home / Self Care | Attending: Orthopedic Surgery

## 2015-08-01 DIAGNOSIS — S72141A Displaced intertrochanteric fracture of right femur, initial encounter for closed fracture: Principal | ICD-10-CM

## 2015-08-01 HISTORY — PX: COMPRESSION HIP SCREW: SHX1386

## 2015-08-01 LAB — BASIC METABOLIC PANEL
ANION GAP: 5 (ref 5–15)
BUN: 18 mg/dL (ref 6–20)
CHLORIDE: 103 mmol/L (ref 101–111)
CO2: 24 mmol/L (ref 22–32)
CREATININE: 0.57 mg/dL (ref 0.44–1.00)
Calcium: 7.9 mg/dL — ABNORMAL LOW (ref 8.9–10.3)
GFR calc non Af Amer: >60 mL/min (ref >60)
Glucose, Bld: 109 mg/dL — ABNORMAL HIGH (ref 65–99)
POTASSIUM: 4.2 mmol/L (ref 3.5–5.1)
SODIUM: 132 mmol/L — AB (ref 135–145)

## 2015-08-01 LAB — PROTIME-INR
INR: 1.07 (ref 0.00–1.49)
Prothrombin Time: 14.1 seconds (ref 11.6–15.2)

## 2015-08-01 LAB — CBC
HCT: 34.7 % — ABNORMAL LOW (ref 36.0–46.0)
HEMOGLOBIN: 12.4 g/dL (ref 12.0–15.0)
MCH: 33.2 pg (ref 26.0–34.0)
MCHC: 35.7 g/dL (ref 30.0–36.0)
MCV: 92.8 fL (ref 78.0–100.0)
PLATELETS: 268 10*3/uL (ref 150–400)
RBC: 3.74 MIL/uL — AB (ref 3.87–5.11)
RDW: 13.2 % (ref 11.5–15.5)
WBC: 6.3 10*3/uL (ref 4.0–10.5)

## 2015-08-01 SURGERY — COMPRESSION HIP
Anesthesia: Regional | Site: Hip | Laterality: Right

## 2015-08-01 MED ORDER — ACETAMINOPHEN 650 MG RE SUPP: 650.0000 mg | Freq: Four times a day (QID) | RECTAL | Status: DC | PRN

## 2015-08-01 MED ORDER — CEFAZOLIN SODIUM-DEXTROSE 2-4 GM/100ML-% IV SOLN
INTRAVENOUS | Status: AC
  Filled 2015-08-01: qty 200

## 2015-08-01 MED ORDER — LIDOCAINE HCL (CARDIAC) 10 MG/ML IV SOLN
INTRAVENOUS | Status: DC | PRN
  Administered 2015-08-01: 25 mg via INTRAVENOUS

## 2015-08-01 MED ORDER — ACETAMINOPHEN 10 MG/ML IV SOLN
1000.0000 mg | Freq: Four times a day (QID) | INTRAVENOUS | Status: AC
  Administered 2015-08-01 – 2015-08-02 (×3): 1000 mg via INTRAVENOUS
  Filled 2015-08-01 (×3): qty 100

## 2015-08-01 MED ORDER — SENNOSIDES-DOCUSATE SODIUM 8.6-50 MG PO TABS
1.0000 | ORAL_TABLET | Freq: Every evening | ORAL | Status: DC | PRN
  Administered 2015-08-02 – 2015-08-03 (×2): 1 via ORAL
  Filled 2015-08-01 (×2): qty 1

## 2015-08-01 MED ORDER — BUPIVACAINE IN DEXTROSE 0.75-8.25 % IT SOLN
INTRATHECAL | Status: DC | PRN
  Administered 2015-08-01: 15 mg via INTRATHECAL

## 2015-08-01 MED ORDER — CEFAZOLIN SODIUM-DEXTROSE 2-4 GM/100ML-% IV SOLN
2.0000 g | INTRAVENOUS | Status: DC
Start: 2015-08-01 — End: 2015-08-01
  Filled 2015-08-01: qty 100

## 2015-08-01 MED ORDER — ALUM & MAG HYDROXIDE-SIMETH 200-200-20 MG/5ML PO SUSP: 30.0000 mL | ORAL | Status: DC | PRN

## 2015-08-01 MED ORDER — MIDAZOLAM HCL 5 MG/5ML IJ SOLN
INTRAMUSCULAR | Status: DC | PRN
  Administered 2015-08-01 (×4): 0.5 mg via INTRAVENOUS

## 2015-08-01 MED ORDER — CHLORHEXIDINE GLUCONATE 4 % EX LIQD
60.0000 mL | Freq: Once | CUTANEOUS | Status: AC
  Administered 2015-08-01: 4 via TOPICAL
  Filled 2015-08-01: qty 15

## 2015-08-01 MED ORDER — FENTANYL CITRATE (PF) 100 MCG/2ML IJ SOLN
INTRAMUSCULAR | Status: AC
  Filled 2015-08-01: qty 2

## 2015-08-01 MED ORDER — CEFAZOLIN SODIUM-DEXTROSE 2-4 GM/100ML-% IV SOLN
INTRAVENOUS | Status: AC
  Filled 2015-08-01: qty 100

## 2015-08-01 MED ORDER — ONDANSETRON HCL 4 MG PO TABS: 4.0000 mg | ORAL_TABLET | Freq: Four times a day (QID) | ORAL | Status: DC | PRN

## 2015-08-01 MED ORDER — POVIDONE-IODINE 10 % EX SWAB: 2.0000 "application " | Freq: Once | CUTANEOUS | Status: DC

## 2015-08-01 MED ORDER — BUPIVACAINE-EPINEPHRINE (PF) 0.5% -1:200000 IJ SOLN
INTRAMUSCULAR | Status: AC
  Filled 2015-08-01: qty 60

## 2015-08-01 MED ORDER — BUPIVACAINE-EPINEPHRINE (PF) 0.5% -1:200000 IJ SOLN
INTRAMUSCULAR | Status: DC | PRN
  Administered 2015-08-01: 60 mL

## 2015-08-01 MED ORDER — ACETAMINOPHEN 10 MG/ML IV SOLN
INTRAVENOUS | Status: AC
  Filled 2015-08-01: qty 200

## 2015-08-01 MED ORDER — ZOLPIDEM TARTRATE 5 MG PO TABS: 5.0000 mg | ORAL_TABLET | Freq: Every evening | ORAL | Status: DC | PRN

## 2015-08-01 MED ORDER — BUPIVACAINE IN DEXTROSE 0.75-8.25 % IT SOLN
INTRATHECAL | Status: AC
  Filled 2015-08-01: qty 2

## 2015-08-01 MED ORDER — DIPHENHYDRAMINE HCL 25 MG PO CAPS
50.0000 mg | ORAL_CAPSULE | Freq: Once | ORAL | Status: AC
  Administered 2015-08-01: 50 mg via ORAL
  Filled 2015-08-01: qty 2

## 2015-08-01 MED ORDER — METOCLOPRAMIDE HCL 5 MG/ML IJ SOLN: 5.0000 mg | Freq: Three times a day (TID) | INTRAMUSCULAR | Status: DC | PRN

## 2015-08-01 MED ORDER — ONDANSETRON HCL 4 MG/2ML IJ SOLN
INTRAMUSCULAR | Status: DC | PRN
Start: 2015-08-01 — End: 2015-08-01
  Administered 2015-08-01: 4 mg via INTRAVENOUS

## 2015-08-01 MED ORDER — DEXAMETHASONE SODIUM PHOSPHATE 4 MG/ML IJ SOLN
INTRAMUSCULAR | Status: DC | PRN
  Administered 2015-08-01: 4 mg via INTRAVENOUS

## 2015-08-01 MED ORDER — TRAMADOL HCL 50 MG PO TABS
50.0000 mg | ORAL_TABLET | Freq: Once | ORAL | Status: AC
Start: 2015-08-01 — End: 2015-08-01
  Administered 2015-08-01: 50 mg via ORAL
  Filled 2015-08-01: qty 1

## 2015-08-01 MED ORDER — LIDOCAINE HCL (PF) 1 % IJ SOLN
INTRAMUSCULAR | Status: AC
  Filled 2015-08-01: qty 5

## 2015-08-01 MED ORDER — PROPOFOL 500 MG/50ML IV EMUL
INTRAVENOUS | Status: DC | PRN
  Administered 2015-08-01: 25 ug/kg/min via INTRAVENOUS

## 2015-08-01 MED ORDER — MENTHOL 3 MG MT LOZG: 1.0000 | LOZENGE | OROMUCOSAL | Status: DC | PRN

## 2015-08-01 MED ORDER — SODIUM CHLORIDE 0.9 % IV SOLN
INTRAVENOUS | Status: DC
  Administered 2015-08-01 – 2015-08-03 (×4): via INTRAVENOUS

## 2015-08-01 MED ORDER — TRAMADOL HCL 50 MG PO TABS
50.0000 mg | ORAL_TABLET | Freq: Four times a day (QID) | ORAL | Status: DC
Start: 2015-08-01 — End: 2015-08-04
  Administered 2015-08-01 – 2015-08-04 (×12): 50 mg via ORAL
  Filled 2015-08-01 (×12): qty 1

## 2015-08-01 MED ORDER — ONDANSETRON HCL 4 MG/2ML IJ SOLN: 4.0000 mg | Freq: Four times a day (QID) | INTRAMUSCULAR | Status: DC | PRN

## 2015-08-01 MED ORDER — ACETAMINOPHEN 325 MG PO TABS
650.0000 mg | ORAL_TABLET | Freq: Four times a day (QID) | ORAL | Status: DC | PRN
  Administered 2015-08-03: 650 mg via ORAL
  Filled 2015-08-01: qty 2

## 2015-08-01 MED ORDER — DIPHENHYDRAMINE HCL (SLEEP) 25 MG PO TABS: 25.0000 mg | ORAL_TABLET | Freq: Every evening | ORAL | Status: DC | PRN

## 2015-08-01 MED ORDER — MIDAZOLAM HCL 2 MG/2ML IJ SOLN
INTRAMUSCULAR | Status: AC
  Filled 2015-08-01: qty 2

## 2015-08-01 MED ORDER — HEMOSTATIC AGENTS (NO CHARGE) OPTIME
TOPICAL | Status: DC | PRN
  Administered 2015-08-01: 1 via TOPICAL

## 2015-08-01 MED ORDER — DOCUSATE SODIUM 100 MG PO CAPS
100.0000 mg | ORAL_CAPSULE | Freq: Two times a day (BID) | ORAL | Status: DC
  Administered 2015-08-01 – 2015-08-04 (×6): 100 mg via ORAL
  Filled 2015-08-01 (×6): qty 1

## 2015-08-01 MED ORDER — FENTANYL CITRATE (PF) 100 MCG/2ML IJ SOLN
INTRAMUSCULAR | Status: DC | PRN
  Administered 2015-08-01: 12.5 ug via INTRATHECAL
  Administered 2015-08-01: 25 ug via INTRAVENOUS
  Administered 2015-08-01 (×2): 12.5 ug via INTRAVENOUS
  Administered 2015-08-01: 25 ug via INTRAVENOUS
  Administered 2015-08-01: 12.5 ug via INTRAVENOUS

## 2015-08-01 MED ORDER — PHENOL 1.4 % MT LIQD: 1.0000 | OROMUCOSAL | Status: DC | PRN

## 2015-08-01 MED ORDER — METOCLOPRAMIDE HCL 10 MG PO TABS: 5.0000 mg | ORAL_TABLET | Freq: Three times a day (TID) | ORAL | Status: DC | PRN

## 2015-08-01 MED ORDER — LACTATED RINGERS IV SOLN
INTRAVENOUS | Status: DC | PRN
  Administered 2015-08-01: 13:00:00 via INTRAVENOUS

## 2015-08-01 MED ORDER — ASPIRIN EC 325 MG PO TBEC
325.0000 mg | DELAYED_RELEASE_TABLET | Freq: Every day | ORAL | Status: DC
  Administered 2015-08-02 – 2015-08-04 (×3): 325 mg via ORAL
  Filled 2015-08-01 (×3): qty 1

## 2015-08-01 MED ORDER — EPINEPHRINE HCL 0.1 MG/ML IJ SOSY
PREFILLED_SYRINGE | INTRAMUSCULAR | Status: DC | PRN
  Administered 2015-08-01: .1 ug via INTRAVENOUS

## 2015-08-01 MED ORDER — CEFAZOLIN SODIUM-DEXTROSE 2-4 GM/100ML-% IV SOLN
2.0000 g | Freq: Four times a day (QID) | INTRAVENOUS | Status: AC
  Administered 2015-08-01 – 2015-08-02 (×2): 2 g via INTRAVENOUS
  Filled 2015-08-01 (×2): qty 100

## 2015-08-01 MED ORDER — CEFAZOLIN SODIUM-DEXTROSE 2-3 GM-% IV SOLR
INTRAVENOUS | Status: DC | PRN
  Administered 2015-08-01: 2 g via INTRAVENOUS

## 2015-08-01 MED ORDER — PROPOFOL 10 MG/ML IV BOLUS
INTRAVENOUS | Status: DC | PRN
  Administered 2015-08-01 (×3): 10 mg via INTRAVENOUS

## 2015-08-01 MED ORDER — HEPARIN SODIUM (PORCINE) 5000 UNIT/ML IJ SOLN: 5000.0000 [IU] | Freq: Three times a day (TID) | INTRAMUSCULAR | Status: DC

## 2015-08-01 MED ORDER — HYDROMORPHONE HCL 1 MG/ML IJ SOLN
0.1000 mg | INTRAMUSCULAR | Status: DC | PRN
  Administered 2015-08-02 (×2): 0.1 mg via INTRAVENOUS
  Filled 2015-08-01 (×3): qty 1

## 2015-08-01 MED ORDER — PROPOFOL 10 MG/ML IV BOLUS
INTRAVENOUS | Status: AC
Start: 2015-08-01 — End: 2015-08-01
  Filled 2015-08-01: qty 20

## 2015-08-01 MED ORDER — OMEGA-3-ACID ETHYL ESTERS 1 G PO CAPS
1.0000 | ORAL_CAPSULE | Freq: Every day | ORAL | Status: DC
  Administered 2015-08-01 – 2015-08-04 (×4): 1 g via ORAL
  Filled 2015-08-01 (×4): qty 1

## 2015-08-01 MED ORDER — PROMETHAZINE HCL 25 MG/ML IJ SOLN
INTRAMUSCULAR | Status: DC | PRN
  Administered 2015-08-01 (×3): 2.5 mg via INTRAVENOUS

## 2015-08-01 MED ORDER — 0.9 % SODIUM CHLORIDE (POUR BTL) OPTIME
TOPICAL | Status: DC | PRN
  Administered 2015-08-01: 1000 mL

## 2015-08-01 MED ORDER — ONDANSETRON HCL 4 MG/2ML IJ SOLN
INTRAMUSCULAR | Status: AC
  Filled 2015-08-01: qty 2

## 2015-08-01 MED ORDER — ACETAMINOPHEN 10 MG/ML IV SOLN
1000.0000 mg | Freq: Once | INTRAVENOUS | Status: DC
  Administered 2015-08-01: 1000 mg via INTRAVENOUS
  Filled 2015-08-01: qty 100

## 2015-08-01 SURGICAL SUPPLY — 51 items
BAG HAMPER (MISCELLANEOUS) ×3 IMPLANT
BIT DRILL TWIST 3.5MM (BIT) ×1 IMPLANT
BLADE SURG SZ10 CARB STEEL (BLADE) ×6 IMPLANT
CLOTH BEACON ORANGE TIMEOUT ST (SAFETY) ×3 IMPLANT
COVER LIGHT HANDLE STERIS (MISCELLANEOUS) ×6 IMPLANT
COVER MAYO STAND XLG (DRAPE) ×3 IMPLANT
DRAPE STERI IOBAN 125X83 (DRAPES) ×3 IMPLANT
DRILL TWIST 3.5MM (BIT) ×3
DRSG MEPILEX BORDER 4X12 (GAUZE/BANDAGES/DRESSINGS) ×2 IMPLANT
DRSG MEPILEX BORDER 4X8 (GAUZE/BANDAGES/DRESSINGS) ×3 IMPLANT
ELECT REM PT RETURN 9FT ADLT (ELECTROSURGICAL) ×3
ELECTRODE REM PT RTRN 9FT ADLT (ELECTROSURGICAL) ×1 IMPLANT
GLOVE BIO SURGEON STRL SZ7 (GLOVE) ×4 IMPLANT
GLOVE BIOGEL PI IND STRL 7.0 (GLOVE) ×1 IMPLANT
GLOVE BIOGEL PI INDICATOR 7.0 (GLOVE) ×4
GLOVE EXAM NITRILE MD LF STRL (GLOVE) ×2 IMPLANT
GLOVE SKINSENSE NS SZ8.0 LF (GLOVE) ×2
GLOVE SKINSENSE STRL SZ8.0 LF (GLOVE) ×1 IMPLANT
GLOVE SS N UNI LF 8.5 STRL (GLOVE) ×3 IMPLANT
GOWN STRL REUS W/TWL LRG LVL3 (GOWN DISPOSABLE) ×7 IMPLANT
GOWN STRL REUS W/TWL XL LVL3 (GOWN DISPOSABLE) ×3 IMPLANT
GUIDE PIN 3.2X230MM (Pin) ×3 IMPLANT
HEMOSTAT SURGICEL 4X8 (HEMOSTASIS) ×2 IMPLANT
INST SET MAJOR BONE (KITS) ×3 IMPLANT
KIT BLADEGUARD II DBL (SET/KITS/TRAYS/PACK) ×3 IMPLANT
KIT ROOM TURNOVER AP CYSTO (KITS) ×3 IMPLANT
MANIFOLD NEPTUNE II (INSTRUMENTS) ×3 IMPLANT
MARKER SKIN DUAL TIP RULER LAB (MISCELLANEOUS) ×3 IMPLANT
NS IRRIG 1000ML POUR BTL (IV SOLUTION) ×3 IMPLANT
PACK BASIC III (CUSTOM PROCEDURE TRAY) ×3
PACK SRG BSC III STRL LF ECLPS (CUSTOM PROCEDURE TRAY) ×1 IMPLANT
PAD ARMBOARD 7.5X6 YLW CONV (MISCELLANEOUS) ×3 IMPLANT
PENCIL HANDSWITCHING (ELECTRODE) ×3 IMPLANT
PIN GUIDE 3.2X230MM (Pin) ×1 IMPLANT
PLATE SHTBRL 130 DEGREE 4 SLOT (Plate) ×2 IMPLANT
SCREW COMPRESSION 19.0M (Screw) ×2 IMPLANT
SCREW CORTICAL SFTP 4.5X34MM (Screw) ×2 IMPLANT
SCREW CORTICAL SFTP 4.5X36MM (Screw) ×2 IMPLANT
SCREW CORTICAL SFTP 4.5X40MM (Screw) ×2 IMPLANT
SCREW CORTICAL SFTP 4.5X42MM (Screw) ×2 IMPLANT
SCREW LAG 95MM (Screw) ×3 IMPLANT
SCREW LAGSTD 95X21X12.7X9 (Screw) IMPLANT
SET BASIN LINEN APH (SET/KITS/TRAYS/PACK) ×3 IMPLANT
SPONGE LAP 18X18 X RAY DECT (DISPOSABLE) ×6 IMPLANT
STAPLER VISISTAT 35W (STAPLE) ×3 IMPLANT
SUT BRALON NAB BRD #1 30IN (SUTURE) ×4 IMPLANT
SUT MNCRL 0 VIOLET CTX 36 (SUTURE) ×1 IMPLANT
SUT MON AB 0 CT1 (SUTURE) ×2 IMPLANT
SUT MONOCRYL 0 CTX 36 (SUTURE) ×4
SYR BULB IRRIGATION 50ML (SYRINGE) ×3 IMPLANT
YANKAUER SUCT BULB TIP NO VENT (SUCTIONS) ×3 IMPLANT

## 2015-08-01 NOTE — Brief Op Note (Signed)
Date of surgery  08/01/2015  3:17 PM  PATIENT:  [name]  80 y.o. female  PRE-OPERATIVE DIAGNOSIS:  right hip fracture intertrochanteric  POST-OPERATIVE DIAGNOSIS:  right hip fracture intertrochanteric  PROCEDURE:  Procedure(s): COMPRESSION HIP (Right)  [employer]hew dynamic hip screw 95 lag screw 4-4.5 cortical screws 130 4 hole plate short barrel Compression screw  SURGEON:  Surgeon(s) and Role:    * Carole Civil, MD - Primary  PHYSICIAN ASSISTANT:   ASSISTANTS: none   ANESTHESIA:   spinal  EBL:  Total I/O In: 700 [I.V.:700] Out: 450 [Urine:300; Blood:150]  BLOOD ADMINISTERED:none  DRAINS: One Hemovac drain in the subcutaneous tissue  LOCAL MEDICATIONS USED:  MARCAINE   , Amount: 30 ml and OTHER with epinephrine  SPECIMEN:  No Specimen  DISPOSITION OF SPECIMEN:  N/A  COUNTS:  YES  TOURNIQUET:  * No tourniquets in log *  DICTATION: .Dragon Dictation  PLAN OF CARE: Admit to inpatient   PATIENT DISPOSITION:  PACU - hemodynamically stable.   Delay start of Pharmacological VTE agent (>24hrs) due to surgical blood loss or risk of bleeding: yes

## 2015-08-01 NOTE — Op Note (Signed)
Date of surgery  08/01/2015  3:17 PM  PATIENT:  [name]  80 y.o. female  PRE-OPERATIVE DIAGNOSIS:  right hip fracture intertrochanteric  POST-OPERATIVE DIAGNOSIS:  right hip fracture intertrochanteric  PROCEDURE:  Procedure(s): COMPRESSION HIP (Right)  Smith [employer] Nephew dynamic hip screw 95 lag screw 4-4.5 cortical screws 130 4 hole plate short barrel Compression screw  Surgical findings basically a nondisplaced basicervical intertrochanteric equivalent fracture of the right hip  Done as follows  The patient came down from the floor we marked her surgical site confirming in his right hip. She was taken to surgery she had a routine spinal anesthetic. She was placed on a fracture table. The well leg was placed in a padded well leg holder and the right leg was padded and placed in traction  Peroneal post was used.  2 g of Ancef was started IV.  C-arm was brought in no real reduction needed to be done C-arm was positioned appropriately  Right leg was prepped and draped sterilely.  Timeout was completed  I checked the implants and x-rays were available for my review  I made the incision over the right thigh at the greater trochanter extended distally approximately 12 cm. Identified the subcutaneous tissue split the fascia in line with the skin incision and then divided the vastus lateralis and an L-shaped fashion retracting the soft tissues anteriorly. I coagulated bleeders as they were encountered. Subperiosteal dissection was completed to expose proximal femur. I took 130 angle guide and placed a pin in the center the femoral head on AP and lateral x-ray.  We measured the guidepin to be 97 mm and I asked for 95 mm lag screw. We overreamed the guidepin 95 mm. We placed the lag screw and plate over the guidepin and the plate over the proximal femur we attach the plate using the first screw in dynamic mode the other 3 screws in neutral mode. We released traction prior to  placing the first screw. We placed a compression screw as well. We had excessive bleeding from the lag screw hole and this was treated with Surgicel and pressure. I placed a drain because of this. After thorough irrigation the layers were closed with 0 Monocryl to close the vastus lateralis. #1 Bralon to close the fascia. 0 Monocryl closed subcutaneous. Staples to close the skin edges. 30 mL of Marcaine injected for postop anesthesia. Drain activated. Drain was placed in the subcutaneous layer.  Sterile dressing applied  Patient taken recovery room in stable condition complaining of some nausea  SURGEON:  Surgeon(s) and Role:    * Carole Civil, MD - Primary  PHYSICIAN ASSISTANT:   ASSISTANTS: none   ANESTHESIA:   spinal  EBL:  Total I/O In: 700 [I.V.:700] Out: 450 [Urine:300; Blood:150]  BLOOD ADMINISTERED:none  DRAINS: One Hemovac drain in the subcutaneous tissue  LOCAL MEDICATIONS USED:  MARCAINE   , Amount: 30 ml and OTHER with epinephrine  SPECIMEN:  No Specimen  DISPOSITION OF SPECIMEN:  N/A  COUNTS:  YES  TOURNIQUET:  * No tourniquets in log *  DICTATION: .Dragon Dictation  PLAN OF CARE: Admit to inpatient   PATIENT DISPOSITION:  PACU - hemodynamically stable.   Delay start of Pharmacological VTE agent (>24hrs) due to surgical blood loss or risk of bleeding: yes

## 2015-08-01 NOTE — Anesthesia Postprocedure Evaluation (Signed)
Anesthesia Post Note  Patient: [name]  Procedure(s) Performed: Procedure(s) (LRB): COMPRESSION HIP (Right)  Patient location during evaluation: PACU Anesthesia Type: Spinal Level of consciousness: awake Pain management: pain level controlled Vital Signs Assessment: post-procedure vital signs reviewed and stable Respiratory status: spontaneous breathing and patient connected to nasal cannula oxygen Cardiovascular status: stable Postop Assessment: spinal receding Anesthetic complications: no Comments: Reports mild nausea.    Last Vitals:  Filed Vitals:   08/01/15 1530 08/01/15 1545  BP: 127/62 136/42  Pulse: 67 63  Temp:    Resp: 11 12    Last Pain:  Filed Vitals:   08/01/15 1609  PainSc: Asleep                 Drucie Opitz

## 2015-08-01 NOTE — Transfer of Care (Signed)
Immediate Anesthesia Transfer of Care Note  Patient: [name]  Procedure(s) Performed: Procedure(s): COMPRESSION HIP (Right)  Patient Location: PACU  Anesthesia Type:Spinal  Level of Consciousness: sedated and patient cooperative  Airway & Oxygen Therapy: Patient Spontanous Breathing and Patient connected to nasal cannula oxygen  Post-op Assessment: Report given to RN and Post -op Vital signs reviewed and stable  Post vital signs: Reviewed and stable    Last Pain:  Filed Vitals:   08/01/15 0647  PainSc: 6          Complications: No apparent anesthesia complications

## 2015-08-01 NOTE — Anesthesia Preprocedure Evaluation (Addendum)
Anesthesia Evaluation  Patient identified by MRN, date of birth, ID band Patient awake    Reviewed: Allergy & Precautions, H&P , NPO status , Patient's Chart, lab work & pertinent test results  History of Anesthesia Complications (+) PONV and history of anesthetic complications  Airway Mallampati: I  TM Distance: >3 FB Neck ROM: Full    Dental  (+) Edentulous Upper, Partial Lower, Dental Advisory Given   Pulmonary COPD, Current Smoker,    Pulmonary exam normal breath sounds clear to auscultation       Cardiovascular hypertension, Pt. on medications + Peripheral Vascular Disease (s/p CEA)  Normal cardiovascular exam Rhythm:Regular Rate:Normal     Neuro/Psych Chronic neck and back pain: advil TIA (now s/p CEA)   GI/Hepatic Neg liver ROS, GERD  Medicated and Controlled,  Endo/Other  Hypothyroidism   Renal/GU negative Renal ROS     Musculoskeletal   Abdominal   Peds  Hematology   Anesthesia Other Findings   Reproductive/Obstetrics                            Anesthesia Physical  Anesthesia Plan  ASA: III and emergent  Anesthesia Plan: Spinal   Post-op Pain Management:    Induction:   Airway Management Planned: Simple Face Mask  Additional Equipment:   Intra-op Plan:   Post-operative Plan:   Informed Consent: I have reviewed the patients History and Physical, chart, labs and discussed the procedure including the risks, benefits and alternatives for the proposed anesthesia with the patient or authorized representative who has indicated his/her understanding and acceptance.   Dental advisory given  Plan Discussed with: Surgeon  Anesthesia Plan Comments:        Anesthesia Quick Evaluation

## 2015-08-01 NOTE — Anesthesia Procedure Notes (Addendum)
Spinal Patient location during procedure: OR Start time: 08/01/2015 1:26 PM End time: 08/01/2015 1:32 PM Staffing Resident/CRNA: Drucie Opitz S Preanesthetic Checklist Completed: patient identified, site marked, surgical consent, pre-op evaluation, timeout performed, IV checked, risks and benefits discussed and monitors and equipment checked Spinal Block Patient position: right lateral decubitus Prep: Betadine Patient monitoring: heart rate, cardiac monitor, continuous pulse ox and blood pressure Approach: right paramedian Location: L3-4 Injection technique: single-shot Needle Needle type: Spinocan  Needle gauge: 22 G Needle length: 9 cm Assessment Sensory level: T6 Additional Notes ATTEMPTS: 1 TRAY ID: lot #  [id number] GTN: [id number] TRAY EXPIRATION DATE: 07-20-2016  Procedure Name: MAC Date/Time: 08/01/2015 1:14 PM Performed by: Vista Deck Pre-anesthesia Checklist: Patient identified, Emergency Drugs available, Suction available, Timeout performed and Patient being monitored Patient Re-evaluated:Patient Re-evaluated prior to inductionOxygen Delivery Method: Non-rebreather mask

## 2015-08-01 NOTE — Consult Note (Signed)
Reason for Consult:RIGHT HIP [place]  Referring Physician: DR Shirlyn Goltz is an 80 y.o. female.  HPI: 80 YO [place] UP FROM [place] ON Monday June 5, [place], HAD XRAYS THEY WERE NEGATIVE. SHE WENT HOME BUT HAD SEVERE PAIN AND DIFFICULTY WALKING. SO, HER SON BROUGHTHER TO [facility], AND XRAYS SHOWED A GREATER TROCHANTER FRACTURE AND CT SHOWED AN  INCOMPLETE FRACTURE OF THE BASE OF THE RIGHT FEMORAL NECK (WHICH IS A INTERTROCHANTERIC EQUIVALENT .  C/O SEVERE RIGHT HIP PAIN RADIATING TO RIGHT LEG DULL, ACHING POOR GAIT   Past Medical History  Diagnosis Date  . Arthritis   . Hypertension   . PONV (postoperative nausea and vomiting)     severe n/v after every surgery  . Hypothyroidism   . Hypothyroid   . Peripheral vascular disease ([place])   . Osteoarthritis   . GERD (gastroesophageal reflux disease)   . Constipation due to pain medication   . COPD (chronic obstructive pulmonary disease) ([place])   . Depression   . Gastritis   . Diverticulosis   . Fibromyalgia   . Stroke ([place])   . Shortness of breath dyspnea   . Pneumonia   . Anxiety     Past Surgical History  Procedure Laterality Date  . Total knee arthroplasty    . Carotid stent    . Shoulder surgery    . Abdominal hysterectomy    . Cholecystectomy    . Back surgery    . Rotator cuff repair    . Eye surgery      cataract /lens both eyes  . Vascular surgery    . Joint replacement  [id number]    knees  . Anterior cervical decomp/discectomy fusion N/A 04/03/2012    Procedure: ANTERIOR CERVICAL DECOMPRESSION/DISCECTOMY FUSION 2 LEVELS;  Surgeon: Hosie Spangle, MD;  Location: [facility];  Service: Neurosurgery;  Laterality: N/A;  Cervical four-five,Cervical five-six anterior cervical decompression with fusion plating and bonegraft  . Cholecystectomy    . Colonoscopy N/A 07/15/2014    Procedure: COLONOSCOPY;  Surgeon: Rogene Houston, MD;  [facility];  Service: Endoscopy;  Laterality: N/A;  200 - moved to 2:10 - Ann to notify pt  . Esophagogastroduodenoscopy N/A 07/15/2014    Procedure: ESOPHAGOGASTRODUODENOSCOPY (EGD);  Surgeon: Rogene Houston, MD;  [facility];  Service: Endoscopy;  Laterality: N/A;  . Peripheral vascular catheterization N/A 10/08/2014    Procedure: Visceral Angiography;  Surgeon: Algernon Huxley, MD;  Location: [facility];  Service: Cardiovascular;  Laterality: N/A;  . Peripheral vascular catheterization N/A 10/08/2014    Procedure: Visceral Artery Intervention;  Surgeon: Algernon Huxley, MD;  Location: [facility] [facility];  Service: Cardiovascular;  Laterality: N/A;    Family History  Problem Relation Age of Onset  . Pancreatic cancer Mother   . Prostate cancer Father     Social History:  reports that she has been smoking Cigarettes.  She has a 22.5 pack-year smoking history. She uses smokeless tobacco. She reports that she does not drink alcohol or use illicit drugs.  Allergies:  Allergies  Allergen Reactions  . Aleve [Naproxen Sodium] Hives and Palpitations  . Wheat Bran     Gluten intolerant  . Celebrex [Celecoxib] Nausea And Vomiting  . Codeine Nausea And Vomiting  . Morphine And Related Nausea And Vomiting    Medications: I have reviewed the patient's current medications.  Results for orders placed or performed during the hospital encounter of 07/31/15 (from the past 48 hour(s))  CBC with Differential     Status: Abnormal   Collection Time: 07/31/15 12:14 PM  Result Value Ref Range   WBC 4.2 4.0 - 10.5 K/uL   RBC 3.73 (L) 3.87 - 5.11 MIL/uL   Hemoglobin 12.5 12.0 - 15.0 g/dL   HCT 34.4 (L) 36.0 - 46.0 %   MCV 92.2 78.0 - 100.0 fL   MCH 33.5 26.0 - 34.0 pg   MCHC 36.3 (H) 30.0 - 36.0 g/dL   RDW 13.2 11.5 - 15.5 %   Platelets 246 150 - 400 K/uL   Neutrophils Relative % 56 %   Neutro Abs 2.4 1.7 - 7.7 K/uL   Lymphocytes Relative 27 %   Lymphs Abs 1.1 0.7 - 4.0 K/uL    Monocytes Relative 15 %   Monocytes Absolute 0.7 0.1 - 1.0 K/uL   Eosinophils Relative 1 %   Eosinophils Absolute 0.0 0.0 - 0.7 K/uL   Basophils Relative 1 %   Basophils Absolute 0.0 0.0 - 0.1 K/uL  Basic metabolic panel     Status: Abnormal   Collection Time: 07/31/15 12:14 PM  Result Value Ref Range   Sodium 129 (L) 135 - 145 mmol/L   Potassium 4.3 3.5 - 5.1 mmol/L   Chloride 97 (L) 101 - 111 mmol/L   CO2 25 22 - 32 mmol/L   Glucose, Bld 106 (H) 65 - 99 mg/dL   BUN 17 6 - 20 mg/dL   Creatinine, Ser 0.57 0.44 - 1.00 mg/dL   Calcium 8.4 (L) 8.9 - 10.3 mg/dL   GFR calc non Af Amer >60 >60 mL/min   GFR calc Af Amer >60 >60 mL/min    Comment: (NOTE) The eGFR has been calculated using the CKD EPI equation. This calculation has not been validated in all clinical situations. eGFR's persistently <60 mL/min signify possible Chronic Kidney Disease.    Anion gap 7 5 - 15  TSH     Status: None   Collection Time: 07/31/15 12:14 PM  Result Value Ref Range   TSH 1.238 0.350 - 4.500 uIU/mL  Urinalysis, Routine w reflex microscopic (not at [place])     Status: Abnormal   Collection Time: 07/31/15 12:22 PM  Result Value Ref Range   Color, Urine YELLOW YELLOW   APPearance CLEAR CLEAR   Specific Gravity, Urine <1.005 (L) 1.005 - 1.030   pH 6.0 5.0 - 8.0   Glucose, UA NEGATIVE NEGATIVE mg/dL   Hgb urine dipstick TRACE (A) NEGATIVE   Bilirubin Urine NEGATIVE NEGATIVE   Ketones, ur NEGATIVE NEGATIVE mg/dL   Protein, ur NEGATIVE NEGATIVE mg/dL   Nitrite NEGATIVE NEGATIVE   Leukocytes, UA NEGATIVE NEGATIVE  Urine microscopic-add on     Status: Abnormal   Collection Time: 07/31/15 12:22 PM  Result Value Ref Range   Squamous Epithelial / LPF 0-5 (A) NONE SEEN   WBC, UA 0-5 0 - 5 WBC/hpf   RBC / HPF 0-5 0 - 5 RBC/hpf   Bacteria, UA MANY (A) NONE SEEN  Prepare RBC     Status: None   Collection Time: 07/31/15  5:54 PM  Result Value Ref Range   Order Confirmation ORDER PROCESSED BY BLOOD BANK    Type and screen [facility]     Status: None (Preliminary result)   Collection Time: 07/31/15  5:54 PM  Result Value Ref Range   ABO/RH(D) O POS  Antibody Screen NEG    Sample Expiration 08/03/2015    Unit Number [id number]    Blood Component Type RED CELLS,LR    Unit division 00    Status of Unit ALLOCATED    Transfusion Status OK TO TRANSFUSE    Crossmatch Result Compatible    Unit Number [id number]    Blood Component Type RBC LR PHER1    Unit division 00    Status of Unit ALLOCATED    Transfusion Status OK TO TRANSFUSE    Crossmatch Result Compatible   ABO/Rh     Status: None   Collection Time: 07/31/15  5:54 PM  Result Value Ref Range   ABO/RH(D) O POS   Surgical PCR screen     Status: None   Collection Time: 07/31/15  9:57 PM  Result Value Ref Range   MRSA, PCR NEGATIVE NEGATIVE   Staphylococcus aureus NEGATIVE NEGATIVE    Comment:        The Xpert SA Assay (FDA approved for NASAL specimens in patients over 74 years of age), is one component of a comprehensive surveillance program.  Test performance has been validated by [facility] for patients greater than or equal to 86 year old. It is not intended to diagnose infection nor to guide or monitor treatment.   Basic metabolic panel     Status: Abnormal   Collection Time: 08/01/15  5:59 AM  Result Value Ref Range   Sodium 132 (L) 135 - 145 mmol/L   Potassium 4.2 3.5 - 5.1 mmol/L   Chloride 103 101 - 111 mmol/L   CO2 24 22 - 32 mmol/L   Glucose, Bld 109 (H) 65 - 99 mg/dL   BUN 18 6 - 20 mg/dL   Creatinine, Ser 0.57 0.44 - 1.00 mg/dL   Calcium 7.9 (L) 8.9 - 10.3 mg/dL   GFR calc non Af Amer >60 >60 mL/min   GFR calc Af Amer >60 >60 mL/min    Comment: (NOTE) The eGFR has been calculated using the CKD EPI equation. This calculation has not been validated in all clinical situations. eGFR's persistently <60 mL/min signify possible Chronic Kidney Disease.    Anion gap 5 5 - 15  CBC     Status:  Abnormal   Collection Time: 08/01/15  5:59 AM  Result Value Ref Range   WBC 6.3 4.0 - 10.5 K/uL   RBC 3.74 (L) 3.87 - 5.11 MIL/uL   Hemoglobin 12.4 12.0 - 15.0 g/dL   HCT 34.7 (L) 36.0 - 46.0 %   MCV 92.8 78.0 - 100.0 fL   MCH 33.2 26.0 - 34.0 pg   MCHC 35.7 30.0 - 36.0 g/dL   RDW 13.2 11.5 - 15.5 %   Platelets 268 150 - 400 K/uL  Protime-INR     Status: None   Collection Time: 08/01/15  5:59 AM  Result Value Ref Range   Prothrombin Time 14.1 11.6 - 15.2 seconds   INR 1.07 0.00 - 1.49    Dg Chest 2 View  07/31/2015  CLINICAL DATA:  Pain after fall. EXAM: CHEST  2 VIEW COMPARISON:  March 29, 2012 FINDINGS: The heart size and mediastinal contours are within normal limits. Both lungs are clear. The visualized skeletal structures are unremarkable. IMPRESSION: No active cardiopulmonary disease. Electronically Signed   By: Dorise Bullion III M.D   On: 07/31/2015 11:10   Dg Forearm Right  07/31/2015  CLINICAL DATA:  Pain after fall. EXAM: RIGHT FOREARM - 2 VIEW COMPARISON:  None.  FINDINGS: Degenerative changes in the right elbow. No fracture or dislocation identified. IMPRESSION: Negative. Electronically Signed   By: Dorise Bullion III M.D   On: 07/31/2015 11:12   Ct Pelvis Wo Contrast  07/31/2015  CLINICAL DATA:  [place] 5 days ago.  Right-sided pain persists. EXAM: CT PELVIS WITHOUT CONTRAST TECHNIQUE: Multidetector CT imaging of the pelvis was performed following the standard protocol without intravenous contrast. COMPARISON:  Plain films including earlier today.  CT of 10/22/2014 FINDINGS: Soft tissues: Large colonic stool burden. Extensive colonic diverticulosis. Advanced aortic and branch vessel atherosclerosis. Hysterectomy. Marked pelvic floor laxity with resultant apparent soft tissue fullness within the pelvic floor. No free intraperitoneal air. No significant free fluid. No soft tissue hematoma. Bones: Mild osteopenia. Femoral heads are located. Comminuted fracture involving the  greater trochanter of the right femur. Subtle extension into the intratrochanteric region is suspected, including on image 54/series 7. Degenerative disc disease involves the L4-5 level. IMPRESSION: 1. Comminuted fracture involving the greater trochanter of the right femur with probable intertrochanteric extension. 2.  Possible constipation. 3. Pelvic floor laxity 4. Advanced atherosclerosis. Electronically Signed   By: Abigail Miyamoto M.D.   On: 07/31/2015 13:23   Dg Humerus Right  07/31/2015  CLINICAL DATA:  Pain after fall EXAM: RIGHT HUMERUS - 2+ VIEW COMPARISON:  None. FINDINGS: There is an unusual configuration to the right humeral head, not seen on arthrogram from 2012. Calcification adjacent to the rotator cuff insertion site is consistent with calcific tendinosis. The remainder of the humerus is normal. Minimal irregularity of the radial head, not seen on the forearm films. No other abnormalities. No definite joint effusion in the elbow. IMPRESSION: 1. Unusual configuration to the right humeral head. This could represent an age-indeterminate fracture. If there is concern for acute fracture in this region today, recommend dedicated images of the shoulder. 2. Mild irregularity of the radial head, not seen on the forearm films. The lack of a joint effusion in the elbow is reassuring. Recommend clinical correlation to exclude point tenderness in this location. Electronically Signed   By: Dorise Bullion III M.D   On: 07/31/2015 11:18   Dg Hip Unilat With Pelvis 2-3 Views Right  07/31/2015  CLINICAL DATA:  [place] hip and right femur pain EXAM: DG HIP (WITH OR WITHOUT PELVIS) 2-3V RIGHT COMPARISON:  None. FINDINGS: Three views of the right hip submitted. No acute fracture or subluxation. The diffuse osteopenia. Mild degenerative changes are noted bilateral hip joints with mild superior acetabular spurring and mild narrowing of superior hip joint space. Mild degenerative changes pubic  symphysis. Degenerative changes are noted with lower lumbar spine. IMPRESSION: No acute fracture or subluxation. Diffuse osteopenia. Degenerative changes as described above Electronically Signed   By: Lahoma Crocker M.D.   On: 07/31/2015 11:12   Dg Femur, Min 2 Views Right  07/31/2015  CLINICAL DATA:  Pain after fall. EXAM: RIGHT FEMUR 2 VIEWS COMPARISON:  None. FINDINGS: The patient is status post right knee replacement. Hardware is in good position. No fractures or dislocations. Vascular calcifications are seen. IMPRESSION: No acute abnormalities. Electronically Signed   By: Dorise Bullion III M.D   On: 07/31/2015 11:11    Review of Systems  Constitutional: Negative for fever, weight loss and malaise/fatigue.  HENT: Negative for ear discharge, nosebleeds and sore throat.   Eyes: Negative for blurred vision, double vision and discharge.  Respiratory: Negative for hemoptysis and stridor.   Cardiovascular: Negative for chest pain.  Gastrointestinal: Negative for heartburn.  Genitourinary: Negative for dysuria.  Musculoskeletal: Positive for myalgias and joint pain.  Skin: Negative for itching and rash.  Neurological: Positive for weakness. Negative for speech change and headaches.  Endo/Heme/Allergies: Negative for environmental allergies and polydipsia. Does not bruise/bleed easily.  Psychiatric/Behavioral: Negative for memory loss.   Blood pressure 138/68, pulse 87, temperature 98.9 F (37.2 C), temperature source Oral, resp. rate 18, height 5' 1.5" (1.562 m), weight 113 lb (51.256 kg), SpO2 96 %. Physical Exam  Constitutional: She is oriented to person, place, and time. Vital signs are normal. She appears well-developed and well-nourished. She is active.  Non-toxic appearance. She does not have a sickly appearance. No distress.  HENT:  Head: Normocephalic and atraumatic. Head is without abrasion, without contusion, without laceration, without right periorbital erythema and without left  periorbital erythema. Hair is normal.  Eyes: Conjunctivae, EOM and lids are normal. Pupils are equal, round, and reactive to light. Right eye exhibits no discharge and no exudate. Left eye exhibits no discharge and no exudate.  Neck: Trachea normal. Neck supple. No spinous process tenderness and no muscular tenderness present. Carotid bruit is not present. Decreased range of motion present. No edema and no erythema present. No thyroid mass and no thyromegaly present.  Cardiovascular: Normal rate, regular rhythm and intact distal pulses.  Exam reveals no decreased pulses.   Pulses:      Dorsalis pedis pulses are 2+ on the right side, and 2+ on the left side.       Posterior tibial pulses are 2+ on the right side, and 2+ on the left side.  Respiratory: Effort normal and breath sounds normal. No accessory muscle usage. No tachypnea and no bradypnea. No respiratory distress.  GI: Soft. Normal appearance and bowel sounds are normal. There is no rigidity, no rebound, no guarding and no CVA tenderness.  Musculoskeletal:       Right shoulder: She exhibits decreased strength. She exhibits normal range of motion, no tenderness, no bony tenderness, no swelling, no effusion, no crepitus, no deformity, no laceration, no pain, no spasm and normal pulse.       Left shoulder: She exhibits decreased strength. She exhibits normal range of motion, no tenderness, no bony tenderness, no swelling, no effusion, no crepitus, no deformity, no laceration, no pain, no spasm and normal pulse.       Right elbow: Normal.      Left elbow: Normal.       Right wrist: Normal.       Left wrist: Normal.       Right hip: She exhibits decreased range of motion, tenderness and bony tenderness. She exhibits normal strength, no swelling, no crepitus, no deformity and no laceration.       Left hip: She exhibits normal range of motion, normal strength, no tenderness, no bony tenderness, no swelling, no crepitus, no deformity and no  laceration.       Left knee: She exhibits normal range of motion, no swelling, no effusion, no ecchymosis, no deformity, no laceration, no erythema, normal alignment, no LCL laxity, normal patellar mobility, no bony tenderness, normal meniscus and no MCL laxity. No tenderness found. No medial joint line, no lateral joint line, no MCL, no LCL and no patellar tendon tenderness noted.       Right ankle: Normal. She exhibits normal range of motion, no swelling, no ecchymosis, no deformity, no laceration and normal pulse. No tenderness. No lateral malleolus, no medial malleolus, no AITFL, no  CF ligament, no posterior TFL, no head of 5th metatarsal and no proximal fibula tenderness found. Achilles tendon normal. Achilles tendon exhibits no pain and no defect.       Left ankle: Normal. She exhibits normal range of motion, no swelling, no ecchymosis, no deformity, no laceration and normal pulse. No tenderness. No lateral malleolus, no medial malleolus, no AITFL, no CF ligament, no posterior TFL, no head of 5th metatarsal and no proximal fibula tenderness found.       Right upper arm: Normal.       Left upper arm: Normal.       Left upper leg: Normal.       Right lower leg: Normal.       Left lower leg: Normal.       Right foot: Normal.       Left foot: Normal.  Lymphadenopathy:    She has no cervical adenopathy.    She has no axillary adenopathy.       Right: No inguinal adenopathy present.       Left: No inguinal adenopathy present.  Neurological: She is alert and oriented to person, place, and time. She is not disoriented. She displays no atrophy and no tremor. No cranial nerve deficit or sensory deficit. She exhibits normal muscle tone. She displays no seizure activity.  Reflex Scores:      Bicep reflexes are 2+ on the right side and 2+ on the left side. Skin: Skin is warm, dry and intact. No bruising and no ecchymosis noted. Rash is not nodular and not urticarial. She is not diaphoretic. No erythema.  No pallor. Nails show no clubbing.  Psychiatric: She has a normal mood and affect. Her speech is normal and behavior is normal. Judgment and thought content normal. Cognition and memory are normal.    Assessment/Plan: XRAYS AND CT REVIEWED   I SEE A GREATER TROCHANTER FRACTURE ON THE PLAIN FILM  ON CT I SEE AN INCOMPLETE BASICERVICAL/INTERTROCHANTER EQUIVALENT RIGHT HIP FRACTURE   OTHER REPORTS REVIEWED AND INCLUDED ABOVE    RIGHT HIP INTERTROCH EQUIVALENT FRACTURE CHRONIC ROTATOR CUFF INSUFF COPD LUMBAR DISC DEGENERATION   This procedure has been fully reviewed with the patient and written informed consent has been obtained.  RISKS OF DVT, PE, INFECTION, BLEEDING REQUIRING TRANSFUSION, AND OTHERS EXPLAINED    OTIF RIGHT HIP WITH SCREW AND SIDE PLATE (COMPRESSION HIP)  Arther Abbott 08/01/2015, 8:23 AM

## 2015-08-01 NOTE — Progress Notes (Signed)
Subjective: This is a  80 years old female with history of multiple medical illnesses was admitted due to right hip fracture. She is planned for OTIF today by Dr. Aline Brochure.  Objective: Vital signs in last 24 hours: Temp:  [98.4 F (36.9 C)-99 F (37.2 C)] 98.9 F (37.2 C) (06/11 0500) Pulse Rate:  [69-98] 87 (06/11 0500) Resp:  [18] 18 (06/11 0500) BP: (123-142)/(56-90) 138/68 mmHg (06/11 0500) SpO2:  [93 %-96 %] 96 % (06/11 0500) Weight:  [51.256 kg (113 lb)] 51.256 kg (113 lb) (06/10 1020) Weight change:  Last BM Date: 07/31/15  Intake/Output from previous day: 06/10 0701 - 06/11 0700 In: 585 [I.V.:585] Out: 725 [Urine:725]  PHYSICAL EXAM General appearance: alert and no distress Resp: clear to auscultation bilaterally Cardio: S1, S2 normal GI: soft, non-tender; bowel sounds normal; no masses,  no organomegaly Extremities: tenderness of the right hip area  Lab Results:  Results for orders placed or performed during the hospital encounter of 07/31/15 (from the past 48 hour(s))  CBC with Differential     Status: Abnormal   Collection Time: 07/31/15 12:14 PM  Result Value Ref Range   WBC 4.2 4.0 - 10.5 K/uL   RBC 3.73 (L) 3.87 - 5.11 MIL/uL   Hemoglobin 12.5 12.0 - 15.0 g/dL   HCT 34.4 (L) 36.0 - 46.0 %   MCV 92.2 78.0 - 100.0 fL   MCH 33.5 26.0 - 34.0 pg   MCHC 36.3 (H) 30.0 - 36.0 g/dL   RDW 13.2 11.5 - 15.5 %   Platelets 246 150 - 400 K/uL   Neutrophils Relative % 56 %   Neutro Abs 2.4 1.7 - 7.7 K/uL   Lymphocytes Relative 27 %   Lymphs Abs 1.1 0.7 - 4.0 K/uL   Monocytes Relative 15 %   Monocytes Absolute 0.7 0.1 - 1.0 K/uL   Eosinophils Relative 1 %   Eosinophils Absolute 0.0 0.0 - 0.7 K/uL   Basophils Relative 1 %   Basophils Absolute 0.0 0.0 - 0.1 K/uL  Basic metabolic panel     Status: Abnormal   Collection Time: 07/31/15 12:14 PM  Result Value Ref Range   Sodium 129 (L) 135 - 145 mmol/L   Potassium 4.3 3.5 - 5.1 mmol/L   Chloride 97 (L) 101 - 111  mmol/L   CO2 25 22 - 32 mmol/L   Glucose, Bld 106 (H) 65 - 99 mg/dL   BUN 17 6 - 20 mg/dL   Creatinine, Ser 0.57 0.44 - 1.00 mg/dL   Calcium 8.4 (L) 8.9 - 10.3 mg/dL   GFR calc non Af Amer >60 >60 mL/min   GFR calc Af Amer >60 >60 mL/min    Comment: (NOTE) The eGFR has been calculated using the CKD EPI equation. This calculation has not been validated in all clinical situations. eGFR's persistently <60 mL/min signify possible Chronic Kidney Disease.    Anion gap 7 5 - 15  TSH     Status: None   Collection Time: 07/31/15 12:14 PM  Result Value Ref Range   TSH 1.238 0.350 - 4.500 uIU/mL  Urinalysis, Routine w reflex microscopic (not at [facility])     Status: Abnormal   Collection Time: 07/31/15 12:22 PM  Result Value Ref Range   Color, Urine YELLOW YELLOW   APPearance CLEAR CLEAR   Specific Gravity, Urine <1.005 (L) 1.005 - 1.030   pH 6.0 5.0 - 8.0   Glucose, UA NEGATIVE NEGATIVE mg/dL   Hgb urine dipstick TRACE (A) NEGATIVE  Bilirubin Urine NEGATIVE NEGATIVE   Ketones, ur NEGATIVE NEGATIVE mg/dL   Protein, ur NEGATIVE NEGATIVE mg/dL   Nitrite NEGATIVE NEGATIVE   Leukocytes, UA NEGATIVE NEGATIVE  Urine microscopic-add on     Status: Abnormal   Collection Time: 07/31/15 12:22 PM  Result Value Ref Range   Squamous Epithelial / LPF 0-5 (A) NONE SEEN   WBC, UA 0-5 0 - 5 WBC/hpf   RBC / HPF 0-5 0 - 5 RBC/hpf   Bacteria, UA MANY (A) NONE SEEN  Prepare RBC     Status: None   Collection Time: 07/31/15  5:54 PM  Result Value Ref Range   Order Confirmation ORDER PROCESSED BY BLOOD BANK   Type and screen [facility]     Status: None (Preliminary result)   Collection Time: 07/31/15  5:54 PM  Result Value Ref Range   ABO/RH(D) O POS    Antibody Screen NEG    Sample Expiration 08/03/2015    Unit Number [id number]    Blood Component Type RED CELLS,LR    Unit division 00    Status of Unit ALLOCATED    Transfusion Status OK TO TRANSFUSE    Crossmatch Result Compatible     Unit Number [id number]    Blood Component Type RBC LR PHER1    Unit division 00    Status of Unit ALLOCATED    Transfusion Status OK TO TRANSFUSE    Crossmatch Result Compatible   ABO/Rh     Status: None   Collection Time: 07/31/15  5:54 PM  Result Value Ref Range   ABO/RH(D) O POS   Surgical PCR screen     Status: None   Collection Time: 07/31/15  9:57 PM  Result Value Ref Range   MRSA, PCR NEGATIVE NEGATIVE   Staphylococcus aureus NEGATIVE NEGATIVE    Comment:        The Xpert SA Assay (FDA approved for NASAL specimens in patients over 64 years of age), is one component of a comprehensive surveillance program.  Test performance has been validated by [facility] for patients greater than or equal to 9 year old. It is not intended to diagnose infection nor to guide or monitor treatment.   Basic metabolic panel     Status: Abnormal   Collection Time: 08/01/15  5:59 AM  Result Value Ref Range   Sodium 132 (L) 135 - 145 mmol/L   Potassium 4.2 3.5 - 5.1 mmol/L   Chloride 103 101 - 111 mmol/L   CO2 24 22 - 32 mmol/L   Glucose, Bld 109 (H) 65 - 99 mg/dL   BUN 18 6 - 20 mg/dL   Creatinine, Ser 0.57 0.44 - 1.00 mg/dL   Calcium 7.9 (L) 8.9 - 10.3 mg/dL   GFR calc non Af Amer >60 >60 mL/min   GFR calc Af Amer >60 >60 mL/min    Comment: (NOTE) The eGFR has been calculated using the CKD EPI equation. This calculation has not been validated in all clinical situations. eGFR's persistently <60 mL/min signify possible Chronic Kidney Disease.    Anion gap 5 5 - 15  CBC     Status: Abnormal   Collection Time: 08/01/15  5:59 AM  Result Value Ref Range   WBC 6.3 4.0 - 10.5 K/uL   RBC 3.74 (L) 3.87 - 5.11 MIL/uL   Hemoglobin 12.4 12.0 - 15.0 g/dL   HCT 34.7 (L) 36.0 - 46.0 %   MCV 92.8 78.0 - 100.0 fL   MCH  33.2 26.0 - 34.0 pg   MCHC 35.7 30.0 - 36.0 g/dL   RDW 13.2 11.5 - 15.5 %   Platelets 268 150 - 400 K/uL  Protime-INR     Status: None   Collection Time: 08/01/15  5:59  AM  Result Value Ref Range   Prothrombin Time 14.1 11.6 - 15.2 seconds   INR 1.07 0.00 - 1.49    ABGS No results for input(s): PHART, PO2ART, TCO2, HCO3 in the last 72 hours.  Invalid input(s): PCO2 CULTURES Recent Results (from the past 240 hour(s))  Surgical PCR screen     Status: None   Collection Time: 07/31/15  9:57 PM  Result Value Ref Range Status   MRSA, PCR NEGATIVE NEGATIVE Final   Staphylococcus aureus NEGATIVE NEGATIVE Final    Comment:        The Xpert SA Assay (FDA approved for NASAL specimens in patients over 90 years of age), is one component of a comprehensive surveillance program.  Test performance has been validated by [facility] for patients greater than or equal to 12 year old. It is not intended to diagnose infection nor to guide or monitor treatment.    Studies/Results: Dg Chest 2 View  07/31/2015  CLINICAL DATA:  Pain after fall. EXAM: CHEST  2 VIEW COMPARISON:  March 29, 2012 FINDINGS: The heart size and mediastinal contours are within normal limits. Both lungs are clear. The visualized skeletal structures are unremarkable. IMPRESSION: No active cardiopulmonary disease. Electronically Signed   By: Dorise Bullion III M.D   On: 07/31/2015 11:10   Dg Forearm Right  07/31/2015  CLINICAL DATA:  Pain after fall. EXAM: RIGHT FOREARM - 2 VIEW COMPARISON:  None. FINDINGS: Degenerative changes in the right elbow. No fracture or dislocation identified. IMPRESSION: Negative. Electronically Signed   By: Dorise Bullion III M.D   On: 07/31/2015 11:12   Ct Pelvis Wo Contrast  07/31/2015  CLINICAL DATA:  [place] days ago.  Right-sided pain persists. EXAM: CT PELVIS WITHOUT CONTRAST TECHNIQUE: Multidetector CT imaging of the pelvis was performed following the standard protocol without intravenous contrast. COMPARISON:  Plain films including earlier today.  CT of 10/22/2014 FINDINGS: Soft tissues: Large colonic stool burden. Extensive colonic diverticulosis. Advanced  aortic and branch vessel atherosclerosis. Hysterectomy. Marked pelvic floor laxity with resultant apparent soft tissue fullness within the pelvic floor. No free intraperitoneal air. No significant free fluid. No soft tissue hematoma. Bones: Mild osteopenia. Femoral heads are located. Comminuted fracture involving the greater trochanter of the right femur. Subtle extension into the intratrochanteric region is suspected, including on image 54/series 7. Degenerative disc disease involves the L4-5 level. IMPRESSION: 1. Comminuted fracture involving the greater trochanter of the right femur with probable intertrochanteric extension. 2.  Possible constipation. 3. Pelvic floor laxity 4. Advanced atherosclerosis. Electronically Signed   By: Abigail Miyamoto M.D.   On: 07/31/2015 13:23   Dg Humerus Right  07/31/2015  CLINICAL DATA:  Pain after fall EXAM: RIGHT HUMERUS - 2+ VIEW COMPARISON:  None. FINDINGS: There is an unusual configuration to the right humeral head, not seen on arthrogram from 2012. Calcification adjacent to the rotator cuff insertion site is consistent with calcific tendinosis. The remainder of the humerus is normal. Minimal irregularity of the radial head, not seen on the forearm films. No other abnormalities. No definite joint effusion in the elbow. IMPRESSION: 1. Unusual configuration to the right humeral head. This could represent an age-indeterminate fracture. If there is concern for acute fracture in  this region today, recommend dedicated images of the shoulder. 2. Mild irregularity of the radial head, not seen on the forearm films. The lack of a joint effusion in the elbow is reassuring. Recommend clinical correlation to exclude point tenderness in this location. Electronically Signed   By: Dorise Bullion III M.D   On: 07/31/2015 11:18   Dg Hip Unilat With Pelvis 2-3 Views Right  07/31/2015  CLINICAL DATA:  [place], right hip and right femur pain EXAM: DG HIP (WITH OR WITHOUT  PELVIS) 2-3V RIGHT COMPARISON:  None. FINDINGS: Three views of the right hip submitted. No acute fracture or subluxation. The diffuse osteopenia. Mild degenerative changes are noted bilateral hip joints with mild superior acetabular spurring and mild narrowing of superior hip joint space. Mild degenerative changes pubic symphysis. Degenerative changes are noted with lower lumbar spine. IMPRESSION: No acute fracture or subluxation. Diffuse osteopenia. Degenerative changes as described above Electronically Signed   By: Lahoma Crocker M.D.   On: 07/31/2015 11:12   Dg Femur, Min 2 Views Right  07/31/2015  CLINICAL DATA:  Pain after fall. EXAM: RIGHT FEMUR 2 VIEWS COMPARISON:  None. FINDINGS: The patient is status post right knee replacement. Hardware is in good position. No fractures or dislocations. Vascular calcifications are seen. IMPRESSION: No acute abnormalities. Electronically Signed   By: Dorise Bullion III M.D   On: 07/31/2015 11:11    Medications: I have reviewed the patient's current medications.  Assesment:   Principal Problem:   Intertrochanteric fracture of right hip [facility]) Active Problems:   Carotid artery disease (HCC)   Tobacco use disorder   Essential hypertension   Hypothyroidism   Hip fracture (HCC)    Plan:  Medications reviewed Orthopedic consult appreciated As per orthopedic plan  Continue pain management    LOS: 1 day   Westlynn Fifer 08/01/2015, 8:38 AM

## 2015-08-02 LAB — CBC
HEMATOCRIT: 32.5 % — AB (ref 36.0–46.0)
HEMOGLOBIN: 11.5 g/dL — AB (ref 12.0–15.0)
MCH: 33.1 pg (ref 26.0–34.0)
MCHC: 35.4 g/dL (ref 30.0–36.0)
MCV: 93.7 fL (ref 78.0–100.0)
Platelets: 263 10*3/uL (ref 150–400)
RBC: 3.47 MIL/uL — AB (ref 3.87–5.11)
RDW: 13.2 % (ref 11.5–15.5)
WBC: 8.3 10*3/uL (ref 4.0–10.5)

## 2015-08-02 LAB — BASIC METABOLIC PANEL
ANION GAP: 6 (ref 5–15)
BUN: 13 mg/dL (ref 6–20)
CO2: 25 mmol/L (ref 22–32)
Calcium: 8.2 mg/dL — ABNORMAL LOW (ref 8.9–10.3)
Chloride: 101 mmol/L (ref 101–111)
Creatinine, Ser: 0.53 mg/dL (ref 0.44–1.00)
GFR calc non Af Amer: >60 mL/min (ref >60)
GLUCOSE: 127 mg/dL — AB (ref 65–99)
POTASSIUM: 4.6 mmol/L (ref 3.5–5.1)
Sodium: 132 mmol/L — ABNORMAL LOW (ref 135–145)

## 2015-08-02 NOTE — Care Management Important Message (Signed)
Important Message  Patient Details  Name: [name] MRN: [id number] Date of Birth: 1935-07-10   Medicare Important Message Given:  Yes    Sherald Barge, RN 08/02/2015, 11:57 AM

## 2015-08-02 NOTE — Evaluation (Signed)
Physical Therapy Evaluation Patient Details Name: [name] MRN: [id number] DOB: [birth year] Today's Date: 08/02/2015   History of Present Illness  80 yo F admitted 07/31/2015 s/p fall while getting off commode x5 days ago with CT now showing communited R greater trochanteric fx with intertrochanteric extension. She is s/p R hip ORIF on 08/01/2015 - WBAT. PMH: HTN, hypothyroidism, PVD, carotid disease, GERD, arthritis, COPD, depression, gastritis, diverticulosis, fibromyalgia, stroke, SOB, PNA, anxiety, TKA, carotid stent, shoulder surgery RTC repair, cholecystectomy, ACDF.  Clinical Impression  Pt received in bed, and was agreeable to PT evaluation.  Pt expressed that she is normally independent with ambulation, ADL's, IADL's, and driving.  Today during PT evaluation, she demonstrated Mod (I) with bed mobility, and Min guard with sit<>stand transfers with RW.  Pt ambulated 3ft with RW and Min guard, and was able to stand at the sink for 10 min with B UE support and supervision to perform ADL's and reaching balance activities.  Due to the fact that pt lives alone, she will need to go to SNF at d/c.  Pt would benefit from continued PT to optimize her balance, strength, endurance, and reduce pain after surgery.     Follow Up Recommendations SNF    Equipment Recommendations  None recommended by PT    Recommendations for Other Services       Precautions / Restrictions Precautions Precautions: Fall Precaution Comments: Gluten Free Diet Restrictions Weight Bearing Restrictions: No RLE Weight Bearing: Weight bearing as tolerated      Mobility  Bed Mobility Overal bed mobility: Modified Independent             General bed mobility comments: bed flat  Transfers Overall transfer level: Needs assistance Equipment used: Rolling walker (2 wheeled) Transfers: Sit to/from Stand Sit to Stand: Min guard         General transfer comment: After ambulation, pt expressed need to  have a BM - use BSC in bathroom, but not successful.    Ambulation/Gait Ambulation/Gait assistance: Min guard Ambulation Distance (Feet): 50 Feet Assistive device: Rolling walker (2 wheeled) Gait Pattern/deviations: Step-to pattern;Trunk flexed     General Gait Details: decreased cadence due to dual tasking - talking to maintenance guy while ambulating.   Stairs            Wheelchair Mobility    Modified Rankin (Stroke Patients Only)       Balance Overall balance assessment: Needs assistance         Standing balance support: Bilateral upper extremity supported Standing balance-Leahy Scale: Fair Standing balance comment: Pt was able to stand for ~10 min while performing UE reaching activities and ADL's at the sink.                              Pertinent Vitals/Pain Pain Assessment: Faces Faces Pain Scale: Hurts even more Pain Location: R hip Pain Descriptors / Indicators: Aching Pain Intervention(s): Limited activity within patient's tolerance    Home Living   Living Arrangements: Alone Available Help at Discharge: Family Type of Home: House Home Access: Stairs to enter   [employer] of Steps: Front = 1 large step, back = 3 steps with HR Home Layout: One level Home Equipment: Walker - 2 wheels;Cane - single point;Shower seat;Bedside commode Additional Comments: Pt lives alone, but she also stays part of the time in her dtr-in-law's beach house.      Prior Function Level of Independence: Independent  Comments: Pt normally ambulates without and DME, and is independent with ADL's, and IADL's.       Hand Dominance   Dominant Hand: Left    Extremity/Trunk Assessment   Upper Extremity Assessment: Overall WFL for tasks assessed           Lower Extremity Assessment: Generalized weakness         Communication   Communication: No difficulties  Cognition Arousal/Alertness: Awake/alert Behavior During Therapy: WFL  for tasks assessed/performed Overall Cognitive Status: Within Functional Limits for tasks assessed                      General Comments      Exercises General Exercises - Lower Extremity Ankle Circles/Pumps: Strengthening;Both;10 reps;Supine Quad Sets: Strengthening;Both;10 reps;Supine Gluteal Sets: Strengthening;Both;10 reps;Supine Heel Slides: Right;Strengthening;10 reps;Supine      Assessment/Plan    PT Assessment Patient needs continued PT services  PT Diagnosis Difficulty walking;Abnormality of gait;Generalized weakness   PT Problem List Decreased strength;Decreased range of motion;Decreased activity tolerance;Decreased balance;Decreased mobility;Decreased knowledge of use of DME;Decreased safety awareness;Decreased knowledge of precautions  PT Treatment Interventions DME instruction;Gait training;Stair training;Functional mobility training;Therapeutic activities;Therapeutic exercise;Balance training;Patient/family education   PT Goals (Current goals can be found in the Care Plan section) Acute Rehab PT Goals Patient Stated Goal: pt wants to go home PT Goal Formulation: With patient Time For Goal Achievement: 08/09/15 Potential to Achieve Goals: Good    Frequency 7X/week   Barriers to discharge Decreased caregiver support      Co-evaluation               End of Session Equipment Utilized During Treatment: Gait belt Activity Tolerance: Patient tolerated treatment well Patient left: in chair Nurse Communication: Mobility status    Functional Assessment Tool Used: [employer] "6-clicks"  Functional Limitation: Mobility: Walking and moving around Mobility: Walking and Moving Around Current Status [phone]): At least 20 percent but less than 40 percent impaired, limited or restricted Mobility: Walking and Moving Around Goal Status [phone]): At least 1 percent but less than 20 percent impaired, limited or restricted    Time: 1432-1535 PT Time  Calculation (min) (ACUTE ONLY): 63 min   Charges:   PT Evaluation $PT Eval Low Complexity: 1 Procedure PT Treatments $Gait Training: 8-22 mins $Therapeutic Exercise: 8-22 mins $Therapeutic Activity: 23-37 mins   PT G Codes:   PT G-Codes **NOT FOR INPATIENT CLASS** Functional Assessment Tool Used: [employer] "6-clicks"  Functional Limitation: Mobility: Walking and moving around Mobility: Walking and Moving Around Current Status [phone]): At least 20 percent but less than 40 percent impaired, limited or restricted Mobility: Walking and Moving Around Goal Status [phone]): At least 1 percent but less than 20 percent impaired, limited or restricted    Beth Arlyn Bumpus, PT, DPT X: [phone]

## 2015-08-02 NOTE — Evaluation (Signed)
Occupational Therapy Evaluation Patient Details Name: [name] MRN: [id number] DOB: [birth year] Today's Date: 08/02/2015    History of Present Illness Patient is a 80 y/o female who sustained a fall on 07/26/15 when getting off the commode and [place]. X ray was completed and was negative. patient continued to have severe pain and was brought to [facility] where it was discovered that she had sustained a right hip fx. Dr. Aline Brochure performed a Right ORIF on 08/01/15. Patient is WBAT on RLE.    Clinical Impression   Pt in bed upon therapy arrival. Patient agreeable to participate in OT evaluation. Nursing present in room during evaluation administering medication. Pt performed well during transfer to recliner. Increased assistance with ADL tasks this morning due to recent surgery. Patient reports bilateral rotator cuff issues and states she has had 3 surgeries on each shoulder. Pt reports that she needs another surgery performed on right shoulder. Pt will benefit from skilled OT services at [facility] upon discharge to increase functional performance during ADL and functional transfers. Pt in agreement with recommendation.     Follow Up Recommendations  SNF    Equipment Recommendations  None recommended by OT    Recommendations for Other Services  PT services     Precautions / Restrictions Precautions Precautions: Fall Precaution Comments: Gluten Free Diet Restrictions Weight Bearing Restrictions: No RLE Weight Bearing: Weight bearing as tolerated      Mobility Bed Mobility Overal bed mobility: Modified Independent                Transfers Overall transfer level: Needs assistance Equipment used: Rolling walker (2 wheeled) Transfers: Sit to/from Stand Sit to Stand: Min guard                   ADL Overall ADL's : Needs assistance/impaired                     Lower Body Dressing: Total assistance Lower Body Dressing Details  (indicate cue type and reason): Pt required assistance with donning hostpital socks. Toilet Transfer: Minimal assistance;RW;Ambulation           Functional mobility during ADLs: Min guard;Rolling walker;Cueing for sequencing                 Pertinent Vitals/Pain Pain Assessment: 0-10 Pain Score: 6  Pain Location: Right hip Pain Descriptors / Indicators: Aching Pain Intervention(s): Repositioned;RN gave pain meds during session;Ice applied     Hand Dominance Left   Extremity/Trunk Assessment Upper Extremity Assessment Upper Extremity Assessment: Overall WFL for tasks assessed   Lower Extremity Assessment Lower Extremity Assessment: Defer to PT evaluation       Communication Communication Communication: No difficulties   Cognition Arousal/Alertness: Awake/alert Behavior During Therapy: WFL for tasks assessed/performed Overall Cognitive Status: Within Functional Limits for tasks assessed                                Home Living Family/patient expects to be discharged to:: Skilled nursing facility Living Arrangements: Alone                               Additional Comments: Patient lives alone. patient reports that she has a home in [place] and at [employer]. patient [place] the beach in [place]. Pt's [place] home is 1  story with several steps to enter. [place] home is a level entry. Patient has a tub shower with 1 grab bar. Patient reports that she has all the necessary DME from her previous knee replacements including 2 wheeled walker, BSC, shower bench, and cane.       Prior Functioning/Environment Level of Independence: Independent             OT Diagnosis: Other (comment) (Decreased ADL performance and functional transfers)   OT Problem List: Decreased strength             End of Session Equipment Utilized During Treatment: Gait belt;Rolling walker Nurse Communication: Mobility status;Patient requests pain  meds  Activity Tolerance: Patient tolerated treatment well Patient left: in chair;with call bell/phone within reach;with nursing/sitter in room   Time: 0824-0904 OT Time Calculation (min): 40 min Charges:  OT General Charges $OT Visit: 1 Procedure OT Evaluation $OT Eval Low Complexity: 1 Procedure G-Codes:    Ailene Ravel, OTR/L,CBIS  [phone]  08/02/2015, 9:16 AM

## 2015-08-02 NOTE — Care Management Note (Signed)
Case Management Note  Patient Details  Name: [name] MRN: [id number] Date of Birth: 1935-11-25  Subjective/Objective:                  Pt admitted after repair of hip fx. OT has recommended SNF. CSW is aware and will make arrangements for placement.   Action/Plan: No CM needs anticipated.   Expected Discharge Date:   08/02/2015               Expected Discharge Plan:  Skilled Nursing Facility  In-House Referral:  Clinical Social Work  Discharge planning Services  CM Consult  Post Acute Care Choice:  NA Choice offered to:  NA  DME Arranged:    DME Agency:     HH Arranged:    [place] Agency:     Status of Service:  Completed, signed off  Medicare Important Message Given:  Yes Date Medicare IM Given:    Medicare IM give by:    Date Additional Medicare IM Given:    Additional Medicare Important Message give by:     If discussed at [place] of Stay Meetings, dates discussed:    Additional Comments:  Sherald Barge, RN 08/02/2015, 3:27 PM

## 2015-08-02 NOTE — Addendum Note (Signed)
Addendum  created 08/02/15 1507 by Vista Deck, CRNA   Modules edited: Notes Section   Notes Section:  File: [id number]

## 2015-08-02 NOTE — Anesthesia Postprocedure Evaluation (Signed)
Anesthesia Post Note  Patient: [name]  Procedure(s) Performed: Procedure(s) (LRB): COMPRESSION HIP (Right)  Patient location during evaluation: Nursing Unit Anesthesia Type: Spinal Level of consciousness: awake and alert Pain management: pain level controlled Vital Signs Assessment: post-procedure vital signs reviewed and stable Respiratory status: spontaneous breathing Cardiovascular status: blood pressure returned to baseline Postop Assessment: patient able to bend at knees Anesthetic complications: no Comments: Left leg is weaker according to patient. Previous CVA. Says she states " think the left leg has had to prop up the right broken hip for a week." and I have ben in the bed for 2 days.  Denies sensory change.     Last Vitals:  Filed Vitals:   08/02/15 0833 08/02/15 1430  BP: 156/59 139/39  Pulse:  87  Temp:  36.4 C  Resp:  18    Last Pain:  Filed Vitals:   08/02/15 1430  PainSc: 2                  Romulo Okray

## 2015-08-03 ENCOUNTER — Encounter (HOSPITAL_COMMUNITY): Payer: Self-pay | Admitting: Orthopedic Surgery

## 2015-08-03 LAB — BASIC METABOLIC PANEL
Anion gap: 4 — ABNORMAL LOW (ref 5–15)
BUN: 14 mg/dL (ref 6–20)
CALCIUM: 7.9 mg/dL — AB (ref 8.9–10.3)
CO2: 25 mmol/L (ref 22–32)
CREATININE: 0.53 mg/dL (ref 0.44–1.00)
Chloride: 102 mmol/L (ref 101–111)
Glucose, Bld: 94 mg/dL (ref 65–99)
Potassium: 4.4 mmol/L (ref 3.5–5.1)
SODIUM: 131 mmol/L — AB (ref 135–145)

## 2015-08-03 LAB — CBC
HCT: 30.5 % — ABNORMAL LOW (ref 36.0–46.0)
HEMOGLOBIN: 10.7 g/dL — AB (ref 12.0–15.0)
MCH: 32.9 pg (ref 26.0–34.0)
MCHC: 35.1 g/dL (ref 30.0–36.0)
MCV: 93.8 fL (ref 78.0–100.0)
Platelets: 295 10*3/uL (ref 150–400)
RBC: 3.25 MIL/uL — ABNORMAL LOW (ref 3.87–5.11)
RDW: 13.2 % (ref 11.5–15.5)
WBC: 9 10*3/uL (ref 4.0–10.5)

## 2015-08-03 MED ORDER — DIPHENHYDRAMINE HCL 25 MG PO CAPS: 50.0000 mg | ORAL_CAPSULE | Freq: Four times a day (QID) | ORAL | Status: DC | PRN

## 2015-08-03 NOTE — Progress Notes (Signed)
Patient ID: [name], female   DOB: 18-Sep-1935, 80 y.o.   MRN: [id number] POD 2  RIGHT HIP FRACTURE OTIF DHS   CBC Latest Ref Rng 08/03/2015 08/02/2015 08/01/2015  WBC 4.0 - 10.5 K/uL 9.0 8.3 6.3  Hemoglobin 12.0 - 15.0 g/dL 10.7(L) 11.5(L) 12.4  Hematocrit 36.0 - 46.0 % 30.5(L) 32.5(L) 34.7(L)  Platelets 150 - 400 K/uL 295 263 268    BMP Latest Ref Rng 08/03/2015 08/02/2015 08/01/2015  Glucose 65 - 99 mg/dL 94 127(H) 109(H)  BUN 6 - 20 mg/dL 14 13 18   Creatinine 0.44 - 1.00 mg/dL 0.53 0.53 0.57  Sodium 135 - 145 mmol/L 131(L) 132(L) 132(L)  Potassium 3.5 - 5.1 mmol/L 4.4 4.6 4.2  Chloride 101 - 111 mmol/L 102 101 103  CO2 22 - 32 mmol/L 25 25 24   Calcium 8.9 - 10.3 mg/dL 7.9(L) 8.2(L) 7.9(L)    DRESSING CHANGED: CLEAN WOUND DRAIN REMOVED   ADV PT  ADD BENADRYL AT NIGHT  Hip fracture instructions for ORIF  Procedure open treatment internal fixation with  HIP SCREW  Weightbearing status as tolerated  Staples removed postop day #  12  Followup visit. Postop day # 28th  X-rays please have x-rays performed. Postop day # 28  DVT prophylaxis x 28 days W/ ASPIRIN

## 2015-08-03 NOTE — Clinical Social Work Placement (Signed)
   CLINICAL SOCIAL WORK PLACEMENT  NOTE  Date:  08/03/2015  Patient Details  Name: [name] MRN: [id number] Date of Birth: Jan 29, 1936  Clinical Social Work is seeking post-discharge placement for this patient at the [place] level of care (*CSW will initial, date and re-position this form in  chart as items are completed):  Yes   Patient/family provided with [place] Work Department's list of facilities offering this level of care within the geographic area requested by the patient (or if unable, by the patient's family).  Yes   Patient/family informed of their freedom to choose among providers that offer the needed level of care, that participate in Medicare, Medicaid or managed care program needed by the patient, have an available bed and are willing to accept the patient.  Yes   Patient/family informed of [facility]'s ownership interest in [facility], as well as of the fact that they are under no obligation to receive care at these facilities.  PASRR submitted to EDS on 08/03/15     PASRR number received on       Existing PASRR number confirmed on       FL2 transmitted to all facilities in geographic area requested by pt/family on 08/03/15     FL2 transmitted to all facilities within larger geographic area on       Patient informed that his/her managed care company has contracts with or will negotiate with certain facilities, including the following:            Patient/family informed of bed offers received.  Patient chooses bed at       Physician recommends and patient chooses bed at      Patient to be transferred to   on  .  Patient to be transferred to facility by       Patient family notified on   of transfer.  Name of family member notified:        PHYSICIAN       Additional Comment:    _______________________________________________ Salome Arnt, [place] 08/03/2015, 11:17  AM [phone]

## 2015-08-03 NOTE — Progress Notes (Signed)
Physical Therapy Treatment Patient Details Name: DELMIRA GRIFFITHS MRN: [id number] DOB: 1935-06-12 Today's Date: 08/03/2015    History of Present Illness 80 yo F admitted 07/31/2015 s/p fall while getting off commode x5 days ago with CT now showing communited R greater trochanteric fx with intertrochanteric extension. She is s/p R hip ORIF on 08/01/2015 - [facility]. PMH: HTN, hypothyroidism, PVD, carotid disease, GERD, arthritis, COPD, depression, gastritis, diverticulosis, fibromyalgia, stroke, SOB, PNA, anxiety, TKA, carotid stent, shoulder surgery RTC repair, cholecystectomy, ACDF.    PT Comments    Pt tolerating treatment session well, motivated and able to complete entire PT sesssion as planned. Pt continues to make progress toward goals as evidenced by improved strength with exercises, requiring less assistance. Pt's greatest limitation continues to be R knee instability and LLE weakness, both of which continue to limit ability to perform all functional mobility at baseline function. Patient presenting with impairment of strength, pain, range of motion, balance, and activity tolerance, limiting ability to perform ADL and mobility tasks at  baseline level of function. Patient will benefit from skilled intervention to address the above impairments and limitations, in order to restore to prior level of function, improve patient safety upon discharge, and to decrease caregiver burden.    Follow Up Recommendations  SNF     Equipment Recommendations  None recommended by PT    Recommendations for Other Services       Precautions / Restrictions Precautions Precautions: Fall Precaution Comments: Gluten Free Diet Restrictions RLE Weight Bearing: Weight bearing as tolerated    Mobility  Bed Mobility Overal bed mobility: Modified Independent             General bed mobility comments: taught to use a foot hook to self assist LLE OOB/into bed  Transfers Overall transfer level: Needs  assistance Equipment used: Rolling walker (2 wheeled) Transfers: Sit to/from Stand Sit to Stand: Min assist         General transfer comment: requires some assistance for the [facility] which is lower than bed/chair.   Ambulation/Gait Ambulation/Gait assistance: Min guard Ambulation Distance (Feet): 40 Feet Assistive device: Rolling walker (2 wheeled)   Gait velocity: 0.30m/s    General Gait Details: very slow and fatgiued, difficulty progresing LLE, due to chronic weakness, and poor stability of RLE. LLE step to gait, three point with RW.    Stairs            Wheelchair Mobility    Modified Rankin (Stroke Patients Only)       Balance Overall balance assessment: Needs assistance             Standing balance comment: a little unsteady when first coming sit to stand.                     Cognition Arousal/Alertness: Awake/alert Behavior During Therapy: WFL for tasks assessed/performed Overall Cognitive Status: Within Functional Limits for tasks assessed                      Exercises Total Joint Exercises Short Arc QuadSinclair Ship;Right;15 reps;Supine Heel Slides: AAROM;Right;15 reps;Supine Hip ABduction/ADduction: AAROM;Right;15 reps;Supine Straight Leg Raises: AAROM;Right;15 reps;Supine Bridges: AROM;Strengthening;Both;10 reps;Supine    General Comments        Pertinent Vitals/Pain Pain Assessment: 0-10 Pain Score: 5  Pain Location: R thigh Pain Descriptors / Indicators: Aching;Cramping Pain Intervention(s): Limited activity within patient's tolerance    Home Living  Prior Function            PT Goals (current goals can now be found in the care plan section) Acute Rehab PT Goals Patient Stated Goal: pt wants to go home PT Goal Formulation: With patient Time For Goal Achievement: 08/09/15 Potential to Achieve Goals: Good Progress towards PT goals: Progressing toward goals    Frequency  7X/week     PT Plan Current plan remains appropriate    Co-evaluation             End of Session Equipment Utilized During Treatment: Gait belt Activity Tolerance: Patient tolerated treatment well;Patient limited by fatigue Patient left: in chair;with call bell/phone within reach     Time: 1028-1054 PT Time Calculation (min) (ACUTE ONLY): 26 min  Charges:  $Gait Training: 8-22 mins $Therapeutic Exercise: 8-22 mins                    G Codes:      12:18 PM, 2015/08/06 Etta Grandchild, PT, DPT PRN Physical Therapist - [place] License # [id number] ([phone] (mobile)

## 2015-08-03 NOTE — NC FL2 (Signed)
[place] LEVEL OF CARE SCREENING TOOL     IDENTIFICATION  Patient Name: [name] Birthdate: 1935/08/10 Sex: female Admission Date (Current Location): 07/31/2015  [facility] and [place] Number:  [employer] and Address:  [facility] [address] [place]      Provider Number: [id number]  Attending Physician Name and Address:  Sinda Du, MD  Relative Name and Phone Number:       Current Level of Care: Hospital Recommended Level of Care: [place] Prior Approval Number:    Date Approved/Denied:   PASRR Number: [id number]   Discharge Plan: SNF    Current Diagnoses: Patient Active Problem List   Diagnosis Date Noted  . Intertrochanteric fracture of right hip ([place]) 08/01/2015    Class: Acute  . Intertrochanteric fracture of right femur ([place]) 08/01/2015  . Hip fracture ([place]) 07/31/2015  . Abdominal pain 09/16/2014  . SMA stenosis ([place]) 09/15/2014  . Abdominal pain, right upper quadrant 09/15/2014  . Essential hypertension 09/15/2014  . Depression 09/15/2014  . Hypothyroidism 09/15/2014  . GERD without esophagitis 09/15/2014  . Lumbago 11/12/2013  . Stiffness of joint, not elsewhere classified, pelvic region and thigh 11/12/2013  . Muscle weakness (generalized) 11/12/2013  . Lack of coordination 03/21/2013  . Fine motor impairment 03/21/2013  . Left leg weakness 03/17/2013  . Risk for falls 03/17/2013  . Carotid artery disease ([place]) 03/11/2013  . Subclavian artery stenosis ([place]) 03/11/2013  . TIA (transient ischemic attack) 03/11/2013  . Tobacco use disorder 03/11/2013  . CVA (cerebral infarction) 03/05/2013  . Pain in joint, shoulder region 11/01/2010    Orientation RESPIRATION BLADDER Height & Weight     Self, Time, Situation, Place  Normal Incontinent Weight: 113 lb (51.256 kg) Height:  5' 1.5" (156.2 cm)  BEHAVIORAL SYMPTOMS/MOOD NEUROLOGICAL BOWEL NUTRITION STATUS  Other  (Comment) (n/a)  (n/a) Incontinent Diet (Gluten free diet)  AMBULATORY STATUS COMMUNICATION OF NEEDS Skin   Limited Assist Verbally Surgical wounds                       Personal Care Assistance Level of Assistance  Bathing, Feeding, Dressing Bathing Assistance: Limited assistance Feeding assistance: Limited assistance Dressing Assistance: Limited assistance     Functional Limitations Info  Sight, Hearing, Speech Sight Info: Adequate Hearing Info: Adequate Speech Info: Adequate    SPECIAL CARE FACTORS FREQUENCY  PT (By licensed PT)     PT Frequency: 5              Contractures Contractures Info: Not present    Additional Factors Info  Code Status, Allergies Code Status Info: Full code Allergies Info: Aleve, Gluten Meal, Wheat bran, Celebrex, Codeine, Morphine and Related           Current Medications (08/03/2015):  This is the current hospital active medication list Current Facility-Administered Medications  Medication Dose Route Frequency Provider Last Rate Last Dose  . 0.9 %  sodium chloride infusion   Intravenous Continuous Carole Civil, MD 75 mL/hr at 08/03/15 1143    . acetaminophen (TYLENOL) tablet 650 mg  650 mg Oral Q6H PRN Carole Civil, MD       Or  . acetaminophen (TYLENOL) suppository 650 mg  650 mg Rectal Q6H PRN Carole Civil, MD      . alum & mag hydroxide-simeth (MAALOX/MYLANTA) 200-200-20 MG/5ML suspension 30 mL  30 mL Oral Q4H PRN Carole Civil, MD      .  aspirin EC tablet 325 mg  325 mg Oral Q breakfast Carole Civil, MD   325 mg at 08/03/15 0756  . diltiazem (CARDIZEM CD) 24 hr capsule 120 mg  120 mg Oral Daily Dianne Dun, NP   120 mg at 08/03/15 0936  . diphenhydrAMINE (BENADRYL) capsule 50 mg  50 mg Oral Q6H PRN Carole Civil, MD      . docusate sodium (COLACE) capsule 100 mg  100 mg Oral BID Carole Civil, MD   100 mg at 08/03/15 0936  . DULoxetine (CYMBALTA) DR capsule 60 mg  60 mg Oral  Daily Orvan Falconer, MD   60 mg at 08/03/15 0936  . HYDROmorphone (DILAUDID) injection 0.1 mg  0.1 mg Intravenous Q2H PRN Carole Civil, MD   0.1 mg at 08/02/15 2358  . isosorbide mononitrate (IMDUR) 24 hr tablet 15 mg  15 mg Oral Daily Orvan Falconer, MD   15 mg at 08/03/15 0937  . levothyroxine (SYNTHROID, LEVOTHROID) tablet 88 mcg  88 mcg Oral QAC breakfast Orvan Falconer, MD   88 mcg at 08/03/15 0756  . menthol-cetylpyridinium (CEPACOL) lozenge 3 mg  1 lozenge Oral PRN Carole Civil, MD       Or  . phenol (CHLORASEPTIC) mouth spray 1 spray  1 spray Mouth/Throat PRN Carole Civil, MD      . metoCLOPramide (REGLAN) tablet 5-10 mg  5-10 mg Oral Q8H PRN Carole Civil, MD       Or  . metoCLOPramide (REGLAN) injection 5-10 mg  5-10 mg Intravenous Q8H PRN Carole Civil, MD      . niacin tablet 500 mg  500 mg Oral Once Dianne Dun, NP   500 mg at 07/31/15 2315  . nicotine (NICODERM CQ - dosed in mg/24 hours) patch 14 mg  14 mg Transdermal Daily Orvan Falconer, MD   14 mg at 08/03/15 0936  . omega-3 acid ethyl esters (LOVAZA) capsule 1 g  1 capsule Oral Daily Carole Civil, MD   1 g at 08/03/15 0936  . ondansetron (ZOFRAN) tablet 4 mg  4 mg Oral Q6H PRN Carole Civil, MD       Or  . ondansetron [facility]) injection 4 mg  4 mg Intravenous Q6H PRN Carole Civil, MD      . pantoprazole (PROTONIX) EC tablet 40 mg  40 mg Oral Daily Orvan Falconer, MD   40 mg at 08/03/15 0936  . senna-docusate (Senokot-S) tablet 1 tablet  1 tablet Oral QHS PRN Carole Civil, MD   1 tablet at 08/02/15 2228  . traMADol (ULTRAM) tablet 50 mg  50 mg Oral Q6H Carole Civil, MD   50 mg at 08/03/15 1143     Discharge Medications: Please see discharge summary for a list of discharge medications.  Relevant Imaging Results:  Relevant Lab Results:   Additional Information SS#: [id number]  Salome Arnt, [place]

## 2015-08-03 NOTE — Progress Notes (Signed)
Subjective: Patient is resting. She had her surgery and physical therapy is started. Her pain is controlled. She is planned for admission to rehab center.  Objective: Vital signs in last 24 hours: Temp:  [97.5 F (36.4 C)-98.6 F (37 C)] 97.5 F (36.4 C) (06/13 0456) Pulse Rate:  [68-87] 82 (06/13 0456) Resp:  [18-20] 20 (06/13 0456) BP: (139-174)/(39-61) 174/61 mmHg (06/13 0456) SpO2:  [93 %-100 %] 95 % (06/13 0456) Weight change:  Last BM Date: 07/31/15  Intake/Output from previous day: 06/12 0701 - 06/13 0700 In: 9563 [P.O.:720; I.V.:560] Out: 450 [Urine:350; Drains:100]  PHYSICAL EXAM General appearance: alert and no distress Resp: clear to auscultation bilaterally Cardio: S1, S2 normal GI: soft, non-tender; bowel sounds normal; no masses,  no organomegaly Extremities: tenderness of the right hip area  Lab Results:  Results for orders placed or performed during the hospital encounter of 07/31/15 (from the past 48 hour(s))  CBC     Status: Abnormal   Collection Time: 08/02/15  4:43 AM  Result Value Ref Range   WBC 8.3 4.0 - 10.5 K/uL   RBC 3.47 (L) 3.87 - 5.11 MIL/uL   Hemoglobin 11.5 (L) 12.0 - 15.0 g/dL   HCT 32.5 (L) 36.0 - 46.0 %   MCV 93.7 78.0 - 100.0 fL   MCH 33.1 26.0 - 34.0 pg   MCHC 35.4 30.0 - 36.0 g/dL   RDW 13.2 11.5 - 15.5 %   Platelets 263 150 - 400 K/uL  Basic metabolic panel     Status: Abnormal   Collection Time: 08/02/15  4:43 AM  Result Value Ref Range   Sodium 132 (L) 135 - 145 mmol/L   Potassium 4.6 3.5 - 5.1 mmol/L   Chloride 101 101 - 111 mmol/L   CO2 25 22 - 32 mmol/L   Glucose, Bld 127 (H) 65 - 99 mg/dL   BUN 13 6 - 20 mg/dL   Creatinine, Ser 0.53 0.44 - 1.00 mg/dL   Calcium 8.2 (L) 8.9 - 10.3 mg/dL   GFR calc non Af Amer >60 >60 mL/min   GFR calc Af Amer >60 >60 mL/min    Comment: (NOTE) The eGFR has been calculated using the CKD EPI equation. This calculation has not been validated in all clinical situations. eGFR's  persistently <60 mL/min signify possible Chronic Kidney Disease.    Anion gap 6 5 - 15  CBC     Status: Abnormal   Collection Time: 08/03/15  4:37 AM  Result Value Ref Range   WBC 9.0 4.0 - 10.5 K/uL   RBC 3.25 (L) 3.87 - 5.11 MIL/uL   Hemoglobin 10.7 (L) 12.0 - 15.0 g/dL   HCT 30.5 (L) 36.0 - 46.0 %   MCV 93.8 78.0 - 100.0 fL   MCH 32.9 26.0 - 34.0 pg   MCHC 35.1 30.0 - 36.0 g/dL   RDW 13.2 11.5 - 15.5 %   Platelets 295 150 - 400 K/uL  Basic metabolic panel     Status: Abnormal   Collection Time: 08/03/15  4:37 AM  Result Value Ref Range   Sodium 131 (L) 135 - 145 mmol/L   Potassium 4.4 3.5 - 5.1 mmol/L   Chloride 102 101 - 111 mmol/L   CO2 25 22 - 32 mmol/L   Glucose, Bld 94 65 - 99 mg/dL   BUN 14 6 - 20 mg/dL   Creatinine, Ser 0.53 0.44 - 1.00 mg/dL   Calcium 7.9 (L) 8.9 - 10.3 mg/dL   GFR calc  non Af Amer >60 >60 mL/min   GFR calc Af Amer >60 >60 mL/min    Comment: (NOTE) The eGFR has been calculated using the CKD EPI equation. This calculation has not been validated in all clinical situations. eGFR's persistently <60 mL/min signify possible Chronic Kidney Disease.    Anion gap 4 (L) 5 - 15    ABGS No results for input(s): PHART, PO2ART, TCO2, HCO3 in the last 72 hours.  Invalid input(s): PCO2 CULTURES Recent Results (from the past 240 hour(s))  Surgical PCR screen     Status: None   Collection Time: 07/31/15  9:57 PM  Result Value Ref Range Status   MRSA, PCR NEGATIVE NEGATIVE Final   Staphylococcus aureus NEGATIVE NEGATIVE Final    Comment:        The Xpert SA Assay (FDA approved for NASAL specimens in patients over 66 years of age), is one component of a comprehensive surveillance program.  Test performance has been validated by [facility] for patients greater than or equal to 93 year old. It is not intended to diagnose infection nor to guide or monitor treatment.    Studies/Results: Dg Hip Operative Unilat With Pelvis Right  08/01/2015   CLINICAL DATA:  Right hip fracture, c-arm in OR EXAM: OPERATIVE RIGHT HIP (WITH PELVIS IF PERFORMED) 8 VIEWS TECHNIQUE: Fluoroscopic spot image(s) were submitted for interpretation post-operatively. COMPARISON:  07/31/2015 FINDINGS: Lateral compression plate across the proximal femoral shaft with dynamic hip screw through the femoral neck. Known intertrochanteric fracture not readily visible. IMPRESSION: ORIF right femur Electronically Signed   By: Skipper Cliche M.D.   On: 08/01/2015 15:17    Medications: I have reviewed the patient's current medications.  Assesment:   Principal Problem:   Intertrochanteric fracture of right hip (HCC) Active Problems:   Carotid artery disease (HCC)   Tobacco use disorder   Essential hypertension   Hypothyroidism   Hip fracture (HCC)   Intertrochanteric fracture of right femur ([place])    Plan:  Medications reviewed Continue physical therapy Pain control Will discharge when bed is available in SNf    LOS: 3 days   [name] 08/03/2015, 1:47 PM

## 2015-08-03 NOTE — NC FL2 (Deleted)
[place] LEVEL OF CARE SCREENING TOOL     IDENTIFICATION  Patient Name: [name] Birthdate: 12-31-1935 Sex: female Admission Date (Current Location): 07/31/2015  [facility] and [place] Number:  [employer] and Address:  [facility] [address] [place]      Provider Number: [id number]  Attending Physician Name and Address:  Sinda Du, MD  Relative Name and Phone Number:       Current Level of Care: Hospital Recommended Level of Care: [place] Prior Approval Number:    Date Approved/Denied:   PASRR Number:    Discharge Plan: SNF    Current Diagnoses: Patient Active Problem List   Diagnosis Date Noted  . Intertrochanteric fracture of right hip ([place]) 08/01/2015    Class: Acute  . Intertrochanteric fracture of right femur ([place]) 08/01/2015  . Hip fracture ([place]) 07/31/2015  . Abdominal pain 09/16/2014  . SMA stenosis ([place]) 09/15/2014  . Abdominal pain, right upper quadrant 09/15/2014  . Essential hypertension 09/15/2014  . Depression 09/15/2014  . Hypothyroidism 09/15/2014  . GERD without esophagitis 09/15/2014  . Lumbago 11/12/2013  . Stiffness of joint, not elsewhere classified, pelvic region and thigh 11/12/2013  . Muscle weakness (generalized) 11/12/2013  . Lack of coordination 03/21/2013  . Fine motor impairment 03/21/2013  . Left leg weakness 03/17/2013  . Risk for falls 03/17/2013  . Carotid artery disease ([place]) 03/11/2013  . Subclavian artery stenosis ([place]) 03/11/2013  . TIA (transient ischemic attack) 03/11/2013  . Tobacco use disorder 03/11/2013  . CVA (cerebral infarction) 03/05/2013  . Pain in joint, shoulder region 11/01/2010    Orientation RESPIRATION BLADDER Height & Weight     Self, Time, Situation, Place  Normal Incontinent Weight: 113 lb (51.256 kg) Height:  5' 1.5" (156.2 cm)  BEHAVIORAL SYMPTOMS/MOOD NEUROLOGICAL BOWEL NUTRITION STATUS  Other (Comment) (n/a)   (n/a) Incontinent Diet (Gluten free diet)  AMBULATORY STATUS COMMUNICATION OF NEEDS Skin   Limited Assist Verbally Surgical wounds                       Personal Care Assistance Level of Assistance  Bathing, Feeding, Dressing Bathing Assistance: Limited assistance Feeding assistance: Limited assistance Dressing Assistance: Limited assistance     Functional Limitations Info  Sight, Hearing, Speech Sight Info: Adequate Hearing Info: Adequate Speech Info: Adequate    SPECIAL CARE FACTORS FREQUENCY  PT (By licensed PT)     PT Frequency: 5              Contractures Contractures Info: Not present    Additional Factors Info  Code Status, Allergies Code Status Info: Full code Allergies Info: Aleve, Gluten Meal, Wheat bran, Celebrex, Codeine, Morphine and Related           Current Medications (08/03/2015):  This is the current hospital active medication list Current Facility-Administered Medications  Medication Dose Route Frequency Provider Last Rate Last Dose  . 0.9 %  sodium chloride infusion   Intravenous Continuous Carole Civil, MD 75 mL/hr at 08/02/15 2126    . acetaminophen (TYLENOL) tablet 650 mg  650 mg Oral Q6H PRN Carole Civil, MD       Or  . acetaminophen (TYLENOL) suppository 650 mg  650 mg Rectal Q6H PRN Carole Civil, MD      . alum & mag hydroxide-simeth (MAALOX/MYLANTA) 200-200-20 MG/5ML suspension 30 mL  30 mL Oral Q4H PRN Carole Civil, MD      .  aspirin EC tablet 325 mg  325 mg Oral Q breakfast Carole Civil, MD   325 mg at 08/03/15 0756  . diltiazem (CARDIZEM CD) 24 hr capsule 120 mg  120 mg Oral Daily Dianne Dun, NP   120 mg at 08/03/15 0936  . diphenhydrAMINE (BENADRYL) capsule 50 mg  50 mg Oral Q6H PRN Carole Civil, MD      . docusate sodium (COLACE) capsule 100 mg  100 mg Oral BID Carole Civil, MD   100 mg at 08/03/15 0936  . DULoxetine (CYMBALTA) DR capsule 60 mg  60 mg Oral Daily Orvan Falconer, MD    60 mg at 08/03/15 0936  . HYDROmorphone (DILAUDID) injection 0.1 mg  0.1 mg Intravenous Q2H PRN Carole Civil, MD   0.1 mg at 08/02/15 2358  . isosorbide mononitrate (IMDUR) 24 hr tablet 15 mg  15 mg Oral Daily Orvan Falconer, MD   15 mg at 08/03/15 0937  . levothyroxine (SYNTHROID, LEVOTHROID) tablet 88 mcg  88 mcg Oral QAC breakfast Orvan Falconer, MD   88 mcg at 08/03/15 0756  . menthol-cetylpyridinium (CEPACOL) lozenge 3 mg  1 lozenge Oral PRN Carole Civil, MD       Or  . phenol (CHLORASEPTIC) mouth spray 1 spray  1 spray Mouth/Throat PRN Carole Civil, MD      . metoCLOPramide (REGLAN) tablet 5-10 mg  5-10 mg Oral Q8H PRN Carole Civil, MD       Or  . metoCLOPramide (REGLAN) injection 5-10 mg  5-10 mg Intravenous Q8H PRN Carole Civil, MD      . niacin tablet 500 mg  500 mg Oral Once Dianne Dun, NP   500 mg at 07/31/15 2315  . nicotine (NICODERM CQ - dosed in mg/24 hours) patch 14 mg  14 mg Transdermal Daily Orvan Falconer, MD   14 mg at 08/03/15 0936  . omega-3 acid ethyl esters (LOVAZA) capsule 1 g  1 capsule Oral Daily Carole Civil, MD   1 g at 08/03/15 0936  . ondansetron (ZOFRAN) tablet 4 mg  4 mg Oral Q6H PRN Carole Civil, MD       Or  . ondansetron [facility]) injection 4 mg  4 mg Intravenous Q6H PRN Carole Civil, MD      . pantoprazole (PROTONIX) EC tablet 40 mg  40 mg Oral Daily Orvan Falconer, MD   40 mg at 08/03/15 0936  . senna-docusate (Senokot-S) tablet 1 tablet  1 tablet Oral QHS PRN Carole Civil, MD   1 tablet at 08/02/15 2228  . traMADol (ULTRAM) tablet 50 mg  50 mg Oral Q6H Carole Civil, MD   50 mg at 08/03/15 [id number]     Discharge Medications: Please see discharge summary for a list of discharge medications.  Relevant Imaging Results:  Relevant Lab Results:   Additional Information SS#: [id number]  Salome Arnt, [place]

## 2015-08-03 NOTE — Clinical Social Work Note (Signed)
Clinical Social Work Assessment  Patient Details  Name: [name] MRN: [id number] Date of Birth: 01/03/1936  Date of referral:  08/03/15               Reason for consult:  Discharge Planning                Permission sought to share information with:    Permission granted to share information::     Name::        Agency::     Relationship::     Contact Information:     Housing/Transportation Living arrangements for the past 2 months:  Single Family Home Source of Information:  Patient Patient Interpreter Needed:  None Criminal Activity/Legal Involvement Pertinent to Current Situation/Hospitalization:  No - Comment as needed Significant Relationships:  Adult Children Lives with:  Self Do you feel safe going back to the place where you live?  Yes Need for family participation in patient care:  No (Coment)  Care giving concerns:  Pt lives alone.    Social Worker assessment / plan:  CSW met with pt at bedside. Pt post op day 2. She shared that she was at the beach and fell. Xrays were negative there. Upon return home, pt's pain was increasing and CT showed hip fracture. Pt lives alone and is very independent at baseline. Her son lives behind her, but he works during the day. PT evaluated pt and recommend SNF. CSW discussed placement process, including Medicare coverage/criteria. Pt requests PNC. She admits that it is hard to ask for help. CSW validated feelings and encouraged pt of short term SNF until able to go home. Pt is ambulating in hospital.   Employment status:  Retired Forensic scientist:  Medicare PT Recommendations:  [place] / Referral to community resources:  [place]  Patient/Family's Response to care:  Pt agreeable to short term SNF prior to return home.    Patient/Family's Understanding of and Emotional Response to Diagnosis, Current Treatment, and Prognosis:  Pt aware of recommendation for SNF at d/c. She states,  "There is no other way." She acknowledges that she cannot manage on her own at home at this point.   Emotional Assessment Appearance:  Appears stated age Attitude/Demeanor/Rapport:  Other (Pleasant) Affect (typically observed):  Appropriate Orientation:  Oriented to Self, Oriented to Place, Oriented to  Time, Oriented to Situation Alcohol / Substance use:  Not Applicable Psych involvement (Current and /or in the community):  No (Comment)  Discharge Needs  Concerns to be addressed:  Discharge Planning Concerns Readmission within the last 30 days:  No Current discharge risk:  Lives alone Barriers to Discharge:  Continued Medical Work up   [employer], [employer], [place] 08/03/2015, 11:20 AM [phone]

## 2015-08-03 NOTE — Clinical Social Work Placement (Signed)
   CLINICAL SOCIAL WORK PLACEMENT  NOTE  Date:  08/03/2015  Patient Details  Name: TAHJAE SKLAR MRN: [id number] Date of Birth: September 05, 1935  Clinical Social Work is seeking post-discharge placement for this patient at the [place] level of care (*CSW will initial, date and re-position this form in  chart as items are completed):  Yes   Patient/family provided with [place] Work Department's list of facilities offering this level of care within the geographic area requested by the patient (or if unable, by the patient's family).  Yes   Patient/family informed of their freedom to choose among providers that offer the needed level of care, that participate in Medicare, Medicaid or managed care program needed by the patient, have an available bed and are willing to accept the patient.  Yes   Patient/family informed of [place]'s ownership interest in [facility] and [facility], as well as of the fact that they are under no obligation to receive care at these facilities.  PASRR submitted to EDS on 08/03/15     PASRR number received on 08/03/15     Existing PASRR number confirmed on       FL2 transmitted to all facilities in geographic area requested by pt/family on 08/03/15     FL2 transmitted to all facilities within larger geographic area on       Patient informed that his/her managed care company has contracts with or will negotiate with certain facilities, including the following:        Yes   Patient/family informed of bed offers received.  Patient chooses bed at [facility]     Physician recommends and patient chooses bed at      Patient to be transferred to [facility] on  .  Patient to be transferred to facility by       Patient family notified on   of transfer.  Name of family member notified:        PHYSICIAN       Additional Comment:  MD notified that pt has SNF bed.    _______________________________________________ Salome Arnt, [place] 08/03/2015, 2:35 PM [phone]

## 2015-08-04 ENCOUNTER — Inpatient Hospital Stay
Admission: RE | Admit: 2015-08-04 | Discharge: 2015-08-18 | Disposition: A | Discharge status: Home / Self Care | Payer: Medicare Other | Source: Ambulatory Visit | Attending: Pulmonary Disease | Admitting: Pulmonary Disease

## 2015-08-04 DIAGNOSIS — J449 Chronic obstructive pulmonary disease, unspecified: Secondary | ICD-10-CM | POA: Diagnosis not present

## 2015-08-04 DIAGNOSIS — R293 Abnormal posture: Secondary | ICD-10-CM | POA: Diagnosis not present

## 2015-08-04 DIAGNOSIS — R262 Difficulty in walking, not elsewhere classified: Secondary | ICD-10-CM | POA: Diagnosis not present

## 2015-08-04 DIAGNOSIS — S72141D Displaced intertrochanteric fracture of right femur, subsequent encounter for closed fracture with routine healing: Secondary | ICD-10-CM | POA: Diagnosis not present

## 2015-08-04 DIAGNOSIS — M797 Fibromyalgia: Secondary | ICD-10-CM | POA: Diagnosis not present

## 2015-08-04 DIAGNOSIS — F329 Major depressive disorder, single episode, unspecified: Secondary | ICD-10-CM | POA: Diagnosis not present

## 2015-08-04 DIAGNOSIS — M1990 Unspecified osteoarthritis, unspecified site: Secondary | ICD-10-CM | POA: Diagnosis not present

## 2015-08-04 DIAGNOSIS — K5900 Constipation, unspecified: Secondary | ICD-10-CM | POA: Diagnosis not present

## 2015-08-04 DIAGNOSIS — Z4789 Encounter for other orthopedic aftercare: Secondary | ICD-10-CM | POA: Diagnosis not present

## 2015-08-04 DIAGNOSIS — I739 Peripheral vascular disease, unspecified: Secondary | ICD-10-CM | POA: Diagnosis not present

## 2015-08-04 DIAGNOSIS — I2510 Atherosclerotic heart disease of native coronary artery without angina pectoris: Secondary | ICD-10-CM | POA: Diagnosis not present

## 2015-08-04 DIAGNOSIS — M84459A Pathological fracture, hip, unspecified, initial encounter for fracture: Secondary | ICD-10-CM | POA: Diagnosis not present

## 2015-08-04 DIAGNOSIS — K219 Gastro-esophageal reflux disease without esophagitis: Secondary | ICD-10-CM | POA: Diagnosis not present

## 2015-08-04 DIAGNOSIS — F411 Generalized anxiety disorder: Secondary | ICD-10-CM | POA: Diagnosis not present

## 2015-08-04 DIAGNOSIS — M6281 Muscle weakness (generalized): Secondary | ICD-10-CM | POA: Diagnosis not present

## 2015-08-04 DIAGNOSIS — S72001A Fracture of unspecified part of neck of right femur, initial encounter for closed fracture: Secondary | ICD-10-CM | POA: Diagnosis not present

## 2015-08-04 DIAGNOSIS — E039 Hypothyroidism, unspecified: Secondary | ICD-10-CM | POA: Diagnosis not present

## 2015-08-04 DIAGNOSIS — Z9181 History of falling: Secondary | ICD-10-CM | POA: Diagnosis not present

## 2015-08-04 DIAGNOSIS — I10 Essential (primary) hypertension: Secondary | ICD-10-CM | POA: Diagnosis not present

## 2015-08-04 DIAGNOSIS — F17200 Nicotine dependence, unspecified, uncomplicated: Secondary | ICD-10-CM | POA: Diagnosis not present

## 2015-08-04 DIAGNOSIS — M25551 Pain in right hip: Secondary | ICD-10-CM | POA: Diagnosis not present

## 2015-08-04 DIAGNOSIS — M25519 Pain in unspecified shoulder: Secondary | ICD-10-CM | POA: Diagnosis not present

## 2015-08-04 LAB — CBC
HCT: 30.1 % — ABNORMAL LOW (ref 36.0–46.0)
Hemoglobin: 10.6 g/dL — ABNORMAL LOW (ref 12.0–15.0)
MCH: 32.7 pg (ref 26.0–34.0)
MCHC: 35.2 g/dL (ref 30.0–36.0)
MCV: 92.9 fL (ref 78.0–100.0)
Platelets: 318 10*3/uL (ref 150–400)
RBC: 3.24 MIL/uL — AB (ref 3.87–5.11)
RDW: 13.2 % (ref 11.5–15.5)
WBC: 6.9 10*3/uL (ref 4.0–10.5)

## 2015-08-04 LAB — TYPE AND SCREEN
ABO/RH(D): O POS
ANTIBODY SCREEN: NEGATIVE
UNIT DIVISION: 0
UNIT DIVISION: 0

## 2015-08-04 MED ORDER — TRAMADOL HCL 50 MG PO TABS: 50.0000 mg | ORAL_TABLET | Freq: Four times a day (QID) | ORAL | Status: DC

## 2015-08-04 MED ORDER — ASPIRIN 325 MG PO TBEC: 325.0000 mg | DELAYED_RELEASE_TABLET | Freq: Every day | ORAL | Status: DC

## 2015-08-04 NOTE — Progress Notes (Signed)
Report called to [place] staff [facility] LPN,  All questions answered Discharged to [facility]

## 2015-08-04 NOTE — Clinical Social Work Placement (Signed)
   CLINICAL SOCIAL WORK PLACEMENT  NOTE  Date:  08/04/2015  Patient Details  Name: [name] MRN: [id number] Date of Birth: [birth year]  Clinical Social Work is seeking post-discharge placement for this patient at the [place] level of care (*CSW will initial, date and re-position this form in  chart as items are completed):  Yes   Patient/family provided with [place] Work Department's list of facilities offering this level of care within the geographic area requested by the patient (or if unable, by the patient's family).  Yes   Patient/family informed of their freedom to choose among providers that offer the needed level of care, that participate in Medicare, Medicaid or managed care program needed by the patient, have an available bed and are willing to accept the patient.  Yes   Patient/family informed of [place]'s ownership interest in [facility] and [facility], as well as of the fact that they are under no obligation to receive care at these facilities.  PASRR submitted to EDS on 08/03/15     PASRR number received on 08/03/15     Existing PASRR number confirmed on       FL2 transmitted to all facilities in geographic area requested by pt/family on 08/03/15     FL2 transmitted to all facilities within larger geographic area on       Patient informed that his/her managed care company has contracts with or will negotiate with certain facilities, including the following:        Yes   Patient/family informed of bed offers received.  Patient chooses bed at [facility]     Physician recommends and patient chooses bed at      Patient to be transferred to [facility] on 08/04/15.  Patient to be transferred to facility by staff     Patient family notified on 08/04/15 of transfer.  Name of family member notified:  Pt states she will notify her son and no need for CSW to.      PHYSICIAN       Additional  Comment:    _______________________________________________ Salome Arnt, LCSW 08/04/2015, 8:22 AM [phone]

## 2015-08-04 NOTE — Discharge Summary (Addendum)
Physician Discharge Summary  Patient ID: [name] MRN: [id number] DOB/AGE: [birth year] 80 y.o. Primary Care Physician:HAWKINS,EDWARD L, MD Admit date: 07/31/2015 Discharge date: 08/04/2015    Discharge Diagnoses:   Principal Problem:   Intertrochanteric fracture of right hip [facility]) Active Problems:   Carotid artery disease ([place])   Tobacco use disorder   Essential hypertension   Hypothyroidism   Hip fracture (H[facility])   Intertrochanteric fracture of right femur ([facility])     Medication List    TAKE these medications        aspirin 325 MG EC tablet  Take 1 tablet (325 mg total) by mouth daily with breakfast.     Biotin 1000 MCG tablet  Take 500 mcg by mouth daily.     CALCIUM-VITAMIN D PO  Take 2 capsules by mouth daily. Calcium 1200mg  + Vitamin D (unknown strength)     DILT-XR 120 MG 24 hr capsule  Generic drug:  diltiazem  Take 120 mg by mouth daily.     diphenhydrAMINE 25 MG tablet  Commonly known as:  SOMINEX  Take 25 mg by mouth at bedtime as needed for sleep.     DULoxetine 60 MG capsule  Commonly known as:  CYMBALTA  Take 60 mg by mouth daily.     Flax Seed Oil 1000 MG Caps  Take 1 capsule by mouth daily.     levothyroxine 88 MCG tablet  Commonly known as:  SYNTHROID, LEVOTHROID  Take 1 tablet (88 mcg total) by mouth daily.     MEGARED OMEGA-3 KRILL OIL PO  Take 1 tablet by mouth daily.     multivitamin with minerals Tabs tablet  Take 1 tablet by mouth daily.     niacin 500 MG tablet  Take 500 mg by mouth at bedtime.     polyethylene glycol packet  Commonly known as:  MIRALAX / GLYCOLAX  Take 17 g by mouth every other day.     traMADol 50 MG tablet  Commonly known as:  ULTRAM  Take 1 tablet (50 mg total) by mouth every 6 (six) hours.     vitamin C 1000 MG tablet  Take 1,000 mg by mouth 2 (two) times daily.     VITAMIN-B COMPLEX PO  Take 1,000 mcg by mouth daily.        Discharged Condition: improved    Consults:  orthopedics  Significant Diagnostic Studies: Dg Chest 2 View  07/31/2015  CLINICAL DATA:  Pain after fall. EXAM: CHEST  2 VIEW COMPARISON:  March 29, 2012 FINDINGS: The heart size and mediastinal contours are within normal limits. Both lungs are clear. The visualized skeletal structures are unremarkable. IMPRESSION: No active cardiopulmonary disease. Electronically Signed   By: Dorise Bullion III M.D   On: 07/31/2015 11:10   Dg Forearm Right  07/31/2015  CLINICAL DATA:  Pain after fall. EXAM: RIGHT FOREARM - 2 VIEW COMPARISON:  None. FINDINGS: Degenerative changes in the right elbow. No fracture or dislocation identified. IMPRESSION: Negative. Electronically Signed   By: Dorise Bullion III M.D   On: 07/31/2015 11:12   Ct Pelvis Wo Contrast  07/31/2015  CLINICAL DATA:  [place] 5 days ago.  Right-sided pain persists. EXAM: CT PELVIS WITHOUT CONTRAST TECHNIQUE: Multidetector CT imaging of the pelvis was performed following the standard protocol without intravenous contrast. COMPARISON:  Plain films including earlier today.  CT of 10/22/2014 FINDINGS: Soft tissues: Large colonic stool burden. Extensive colonic diverticulosis. Advanced aortic and branch vessel atherosclerosis. Hysterectomy. Marked pelvic floor laxity  with resultant apparent soft tissue fullness within the pelvic floor. No free intraperitoneal air. No significant free fluid. No soft tissue hematoma. Bones: Mild osteopenia. Femoral heads are located. Comminuted fracture involving the greater trochanter of the right femur. Subtle extension into the intratrochanteric region is suspected, including on image 54/series 7. Degenerative disc disease involves the L4-5 level. IMPRESSION: 1. Comminuted fracture involving the greater trochanter of the right femur with probable intertrochanteric extension. 2.  Possible constipation. 3. Pelvic floor laxity 4. Advanced atherosclerosis. Electronically Signed   By: Abigail Miyamoto M.D.   On: 07/31/2015 13:23    Dg Humerus Right  07/31/2015  CLINICAL DATA:  Pain after fall EXAM: RIGHT HUMERUS - 2+ VIEW COMPARISON:  None. FINDINGS: There is an unusual configuration to the right humeral head, not seen on arthrogram from 2012. Calcification adjacent to the rotator cuff insertion site is consistent with calcific tendinosis. The remainder of the humerus is normal. Minimal irregularity of the radial head, not seen on the forearm films. No other abnormalities. No definite joint effusion in the elbow. IMPRESSION: 1. Unusual configuration to the right humeral head. This could represent an age-indeterminate fracture. If there is concern for acute fracture in this region today, recommend dedicated images of the shoulder. 2. Mild irregularity of the radial head, not seen on the forearm films. The lack of a joint effusion in the elbow is reassuring. Recommend clinical correlation to exclude point tenderness in this location. Electronically Signed   By: Dorise Bullion III M.D   On: 07/31/2015 11:18   Dg Hip Operative Unilat With Pelvis Right  08/01/2015  CLINICAL DATA:  Right hip fracture, c-arm in OR EXAM: OPERATIVE RIGHT HIP (WITH PELVIS IF PERFORMED) 8 VIEWS TECHNIQUE: Fluoroscopic spot image(s) were submitted for interpretation post-operatively. COMPARISON:  07/31/2015 FINDINGS: Lateral compression plate across the proximal femoral shaft with dynamic hip screw through the femoral neck. Known intertrochanteric fracture not readily visible. IMPRESSION: ORIF right femur Electronically Signed   By: Skipper Cliche M.D.   On: 08/01/2015 15:17   Dg Hip Unilat With Pelvis 2-3 Views Right  07/31/2015  CLINICAL DATA:  [place], right hip and right femur pain EXAM: DG HIP (WITH OR WITHOUT PELVIS) 2-3V RIGHT COMPARISON:  None. FINDINGS: Three views of the right hip submitted. No acute fracture or subluxation. The diffuse osteopenia. Mild degenerative changes are noted bilateral hip joints with mild superior  acetabular spurring and mild narrowing of superior hip joint space. Mild degenerative changes pubic symphysis. Degenerative changes are noted with lower lumbar spine. IMPRESSION: No acute fracture or subluxation. Diffuse osteopenia. Degenerative changes as described above Electronically Signed   By: Lahoma Crocker M.D.   On: 07/31/2015 11:12   Dg Femur, Min 2 Views Right  07/31/2015  CLINICAL DATA:  Pain after fall. EXAM: RIGHT FEMUR 2 VIEWS COMPARISON:  None. FINDINGS: The patient is status post right knee replacement. Hardware is in good position. No fractures or dislocations. Vascular calcifications are seen. IMPRESSION: No acute abnormalities. Electronically Signed   By: Dorise Bullion III M.D   On: 07/31/2015 11:11    Lab Results: Basic Metabolic Panel:  Recent Labs  08/02/15 0443 08/03/15 0437  NA 132* 131*  K 4.6 4.4  CL 101 102  CO2 25 25  GLUCOSE 127* 94  BUN 13 14  CREATININE 0.53 0.53  CALCIUM 8.2* 7.9*   Liver Function Tests: No results for input(s): AST, ALT, ALKPHOS, BILITOT, PROT, ALBUMIN in the last 72 hours.  CBC:  Recent Labs  08/03/15 0437 08/04/15 0528  WBC 9.0 6.9  HGB 10.7* 10.6*  HCT 30.5* 30.1*  MCV 93.8 92.9  PLT 295 318    Recent Results (from the past 240 hour(s))  Surgical PCR screen     Status: None   Collection Time: 07/31/15  9:57 PM  Result Value Ref Range Status   MRSA, PCR NEGATIVE NEGATIVE Final   Staphylococcus aureus NEGATIVE NEGATIVE Final    Comment:        The Xpert SA Assay (FDA approved for NASAL specimens in patients over 20 years of age), is one component of a comprehensive surveillance program.  Test performance has been validated by [facility] for patients greater than or equal to 38 year old. It is not intended to diagnose infection nor to guide or monitor treatment.      Hospital Course:   This is a 80 years old female with history of multiple medical illnesses who admitted due to right hip fracture. Patient  was seen by Dr. Aline Brochure and ORIF was done. Patient tolerated well the procedure and there was not post-op complication. Patient is being transfer to SNF for rehab. Further post-op treatment as per Dr. Aline Brochure instruction.  Discharge Exam: Blood pressure 133/47, pulse 74, temperature 98.1 F (36.7 C), temperature source Oral, resp. rate 20, height 5' 1.5" (1.562 m), weight 51.256 kg (113 lb), SpO2 92 %.   Disposition:  SNF.      Discharge Instructions    Face-to-face encounter (required for Medicare/Medicaid patients)    Complete by:  As directed   I Mesner, Corene Cornea certify that this patient is under my care and that I, or a nurse practitioner or physician's assistant working with me, had a face-to-face encounter that meets the physician face-to-face encounter requirements with this patient on 07/31/2015. The encounter with the patient was in whole, or in part for the following medical condition(s) which is the primary reason for home health care (List medical condition): leg pain, difficulty walking.  The encounter with the patient was in whole, or in part, for the following medical condition, which is the primary reason for home health care:  fall, posterior leg pain, likely muscular strain v tear  I certify that, based on my findings, the following services are medically necessary home health services:   Physical therapy Nursing    Reason for Medically Necessary Home Health Services:   Therapy- Personnel officer, Public librarian Skilled Nursing- Change/Decline in Patient Status    My clinical findings support the need for the above services:  Pain interferes with ambulation/mobility  Further, I certify that my clinical findings support that this patient is homebound due to:  Pain interferes with ambulation/mobility     Home Health    Complete by:  As directed   To provide the following care/treatments:   PT [place] work                Signed: [facility]   08/04/2015, 8:05 AM

## 2015-08-10 DIAGNOSIS — M84459A Pathological fracture, hip, unspecified, initial encounter for fracture: Secondary | ICD-10-CM | POA: Diagnosis not present

## 2015-08-11 ENCOUNTER — Ambulatory Visit: Payer: Medicare Other | Admitting: Orthopedic Surgery

## 2015-08-11 VITALS — BP 159/59 | HR 82 | Temp 98.1°F

## 2015-08-11 DIAGNOSIS — Z4789 Encounter for other orthopedic aftercare: Secondary | ICD-10-CM

## 2015-08-11 DIAGNOSIS — L03115 Cellulitis of right lower limb: Secondary | ICD-10-CM

## 2015-08-11 MED ORDER — CEPHALEXIN 500 MG PO CAPS: 500.0000 mg | ORAL_CAPSULE | Freq: Four times a day (QID) | ORAL | Status: DC

## 2015-08-11 NOTE — Progress Notes (Signed)
Chief Complaint  Patient presents with  . Wound Check    Compression Right hip DOS 08/01/15   WOUND IS RED   LOOKS LIKE STAPLES NEED TO COME OUT   ERYTHEMA AND TENDERNESS NO DRAINAGE RIGHT HIP WOUND   REC REMOVE STAPLES   START KEFLEX  RETURN IN 5 DAYS FOR WOUND CHECK

## 2015-08-14 NOTE — H&P (Signed)
[name] MRN: [id number] DOB/AGE: [birth year] 80 y.o. Primary Care Physician:Sena Hoopingarner L, MD Admit date: 08/04/2015 Chief Complaint: For rehabilitation following hip fracture HPI: This is documentation of the history and physical performed on 08/10/2015 at the skilled care facility. She is a 80 year old who fell about 5 days prior to her admission to the acute care hospital. [place] had x-rays done that did not show fracture. She continued to have pain and difficulty ambulating and eventually was brought here where she had CT scan that showed comminuted right greater trochanteric fracture with intertrochanteric extension. She had open reduction internal fixation on 08/01/2015. After her recovery from surgery she was transferred to the skilled care facility for rehabilitation. She has multiple medical problems including peripheral arterial disease previous history of stroke hypertension reflux COPD and osteoarthritis. She has hypothyroidism and has had significant issues with depression. She has had ongoing tobacco use. She says she feels much better. She's not short of breath. She has some pain in the operative site. No neurological symptoms no nausea vomiting diarrhea no chest pain. No hemoptysis no HEENT symptoms  Past Medical History  Diagnosis Date  . Arthritis   . Hypertension   . PONV (postoperative nausea and vomiting)     severe n/v after every surgery  . Hypothyroidism   . Hypothyroid   . Peripheral vascular disease ([place])   . Osteoarthritis   . GERD (gastroesophageal reflux disease)   . Constipation due to pain medication   . COPD (chronic obstructive pulmonary disease) ([place])   . Depression   . Gastritis   . Diverticulosis   . Fibromyalgia   . Stroke ([place])   . Shortness of breath dyspnea   . Pneumonia   . Anxiety    Past Surgical History  Procedure Laterality Date  . Total knee arthroplasty    .  Carotid stent    . Shoulder surgery    . Abdominal hysterectomy    . Cholecystectomy    . Back surgery    . Rotator cuff repair    . Eye surgery      cataract /lens both eyes  . Vascular surgery    . Joint replacement  [id number]    knees  . Anterior cervical decomp/discectomy fusion N/A 04/03/2012    Procedure: ANTERIOR CERVICAL DECOMPRESSION/DISCECTOMY FUSION 2 LEVELS;  Surgeon: Hosie Spangle, MD;  Location: [facility];  Service: Neurosurgery;  Laterality: N/A;  Cervical four-five,Cervical five-six anterior cervical decompression with fusion plating and bonegraft  . Cholecystectomy    . Colonoscopy N/A 07/15/2014    Procedure: COLONOSCOPY;  Surgeon: Rogene Houston, MD;  [facility]: [facility];  Service: Endoscopy;  Laterality: N/A;  200 - moved to 2:10 - Ann to notify pt  . Esophagogastroduodenoscopy N/A 07/15/2014    Procedure: ESOPHAGOGASTRODUODENOSCOPY (EGD);  Surgeon: Rogene Houston, MD;  Location: [facility];  Service: Endoscopy;  Laterality: N/A;  . Peripheral vascular catheterization N/A 10/08/2014    Procedure: Visceral Angiography;  Surgeon: Algernon Huxley, MD;  Location: [facility];  Service: Cardiovascular;  Laterality: N/A;  . Peripheral vascular catheterization N/A 10/08/2014    Procedure: Visceral Artery Intervention;  Surgeon: Algernon Huxley, MD;  Location: [facility];  Service: Cardiovascular;  Laterality: N/A;  . Compression hip screw Right 08/01/2015    Procedure: COMPRESSION HIP;  Surgeon: Carole Civil, MD;  Location: [facility];  Service: Orthopedics;  Laterality: Right;        Family History  Problem Relation Age of Onset  . Pancreatic cancer Mother   . Prostate cancer Father     Social History:  reports that she has been smoking Cigarettes.  She has a 22.5 pack-year smoking history. She uses smokeless tobacco. She reports that she does not drink alcohol or use illicit drugs.   Allergies:  Allergies  Allergen Reactions  . Aleve  [Naproxen Sodium] Hives and Palpitations  . Gluten Meal   . Wheat Bran     Gluten intolerant  . Celebrex [Celecoxib] Nausea And Vomiting  . Codeine Nausea And Vomiting  . Morphine And Related Nausea And Vomiting    Medications Prior to Admission  Medication Sig Dispense Refill  . Ascorbic Acid (VITAMIN C) 1000 MG tablet Take 1,000 mg by mouth 2 (two) times daily.     Marland Kitchen aspirin EC 325 MG EC tablet Take 1 tablet (325 mg total) by mouth daily with breakfast. 30 tablet 0  . B Complex Vitamins (VITAMIN-B COMPLEX PO) Take 1,000 mcg by mouth daily.     . Biotin 1000 MCG tablet Take 500 mcg by mouth daily.     Marland Kitchen CALCIUM-VITAMIN D PO Take 2 capsules by mouth daily. Calcium 1200mg  + Vitamin D (unknown strength)    . DILT-XR 120 MG 24 hr capsule Take 120 mg by mouth daily.     . diphenhydrAMINE (SOMINEX) 25 MG tablet Take 25 mg by mouth at bedtime as needed for sleep.    . DULoxetine (CYMBALTA) 60 MG capsule Take 60 mg by mouth daily.    . Flaxseed, Linseed, (FLAX SEED OIL) 1000 MG CAPS Take 1 capsule by mouth daily.     Marland Kitchen levothyroxine (SYNTHROID, LEVOTHROID) 88 MCG tablet Take 1 tablet (88 mcg total) by mouth daily. 30 tablet 1  . MEGARED OMEGA-3 KRILL OIL PO Take 1 tablet by mouth daily.    . Multiple Vitamin (MULTIVITAMIN WITH MINERALS) TABS Take 1 tablet by mouth daily.    . niacin 500 MG tablet Take 500 mg by mouth at bedtime.    . polyethylene glycol (MIRALAX / GLYCOLAX) packet Take 17 g by mouth every other day.    . traMADol (ULTRAM) 50 MG tablet Take 1 tablet (50 mg total) by mouth every 6 (six) hours. 30 tablet 0       [id number] from the symptoms mentioned above,there are no other symptoms referable to all systems reviewed.  Physical Exam: There were no vitals taken for this visit. She is awake and alert. She is thin. Her pupils react. Her nose and throat are clear. Her neck is supple without masses. Her chest shows diminished breath sounds but no wheezing. Her heart is regular  without gallop. Her abdomen is soft no masses are felt. Extremities showed no edema. Operative site looks okay. Central nervous system exam grossly intact   No results for input(s): WBC, NEUTROABS, HGB, HCT, MCV, PLT in the last 72 hours. No results for input(s): NA, K, CL, CO2, GLUCOSE, BUN, CREATININE, CALCIUM, MG in the last 72 hours.  Invalid input(s): PHOlablast2(ast:2,ALT:2,alkphos:2,bilitot:2,prot:2,albumin:2)@    No results found for this or any previous visit (from the past 240 hour(s)).   Dg Chest 2 View  07/31/2015  CLINICAL DATA:  Pain after fall. EXAM: CHEST  2 VIEW COMPARISON:  March 29, 2012 FINDINGS: The heart size and mediastinal contours are within normal limits. Both lungs are clear. The visualized skeletal structures are unremarkable. IMPRESSION: No  active cardiopulmonary disease. Electronically Signed   By: Dorise Bullion III M.D   On: 07/31/2015 11:10   Dg Forearm Right  07/31/2015  CLINICAL DATA:  Pain after fall. EXAM: RIGHT FOREARM - 2 VIEW COMPARISON:  None. FINDINGS: Degenerative changes in the right elbow. No fracture or dislocation identified. IMPRESSION: Negative. Electronically Signed   By: Dorise Bullion III M.D   On: 07/31/2015 11:12   Ct Pelvis Wo Contrast  07/31/2015  CLINICAL DATA:  [place] 5 days ago.  Right-sided pain persists. EXAM: CT PELVIS WITHOUT CONTRAST TECHNIQUE: Multidetector CT imaging of the pelvis was performed following the standard protocol without intravenous contrast. COMPARISON:  Plain films including earlier today.  CT of 10/22/2014 FINDINGS: Soft tissues: Large colonic stool burden. Extensive colonic diverticulosis. Advanced aortic and branch vessel atherosclerosis. Hysterectomy. Marked pelvic floor laxity with resultant apparent soft tissue fullness within the pelvic floor. No free intraperitoneal air. No significant free fluid. No soft tissue hematoma. Bones: Mild osteopenia. Femoral heads are located. Comminuted fracture involving the  greater trochanter of the right femur. Subtle extension into the intratrochanteric region is suspected, including on image 54/series 7. Degenerative disc disease involves the L4-5 level. IMPRESSION: 1. Comminuted fracture involving the greater trochanter of the right femur with probable intertrochanteric extension. 2.  Possible constipation. 3. Pelvic floor laxity 4. Advanced atherosclerosis. Electronically Signed   By: Abigail Miyamoto M.D.   On: 07/31/2015 13:23   Dg Humerus Right  07/31/2015  CLINICAL DATA:  Pain after fall EXAM: RIGHT HUMERUS - 2+ VIEW COMPARISON:  None. FINDINGS: There is an unusual configuration to the right humeral head, not seen on arthrogram from 2012. Calcification adjacent to the rotator cuff insertion site is consistent with calcific tendinosis. The remainder of the humerus is normal. Minimal irregularity of the radial head, not seen on the forearm films. No other abnormalities. No definite joint effusion in the elbow. IMPRESSION: 1. Unusual configuration to the right humeral head. This could represent an age-indeterminate fracture. If there is concern for acute fracture in this region today, recommend dedicated images of the shoulder. 2. Mild irregularity of the radial head, not seen on the forearm films. The lack of a joint effusion in the elbow is reassuring. Recommend clinical correlation to exclude point tenderness in this location. Electronically Signed   By: Dorise Bullion III M.D   On: 07/31/2015 11:18   Dg Hip Operative Unilat With Pelvis Right  08/01/2015  CLINICAL DATA:  Right hip fracture, c-arm in OR EXAM: OPERATIVE RIGHT HIP (WITH PELVIS IF PERFORMED) 8 VIEWS TECHNIQUE: Fluoroscopic spot image(s) were submitted for interpretation post-operatively. COMPARISON:  07/31/2015 FINDINGS: Lateral compression plate across the proximal femoral shaft with dynamic hip screw through the femoral neck. Known intertrochanteric fracture not readily visible. IMPRESSION: ORIF right femur  Electronically Signed   By: Skipper Cliche M.D.   On: 08/01/2015 15:17   Dg Hip Unilat With Pelvis 2-3 Views Right  07/31/2015  CLINICAL DATA:  [place], right hip and right femur pain EXAM: DG HIP (WITH OR WITHOUT PELVIS) 2-3V RIGHT COMPARISON:  None. FINDINGS: Three views of the right hip submitted. No acute fracture or subluxation. The diffuse osteopenia. Mild degenerative changes are noted bilateral hip joints with mild superior acetabular spurring and mild narrowing of superior hip joint space. Mild degenerative changes pubic symphysis. Degenerative changes are noted with lower lumbar spine. IMPRESSION: No acute fracture or subluxation. Diffuse osteopenia. Degenerative changes as described above Electronically Signed   By: Julien Girt  Pop M.D.   On: 07/31/2015 11:12   Dg Femur, Min 2 Views Right  07/31/2015  CLINICAL DATA:  Pain after fall. EXAM: RIGHT FEMUR 2 VIEWS COMPARISON:  None. FINDINGS: The patient is status post right knee replacement. Hardware is in good position. No fractures or dislocations. Vascular calcifications are seen. IMPRESSION: No acute abnormalities. Electronically Signed   By: Dorise Bullion III M.D   On: 07/31/2015 11:11   Impression: She had fractured hip and has had surgery for that. She is doing well with rehabilitation  She has COPD which seems pretty stable. She has ongoing tobacco abuse and is using a nicotine patch  She's had trouble with depression and that seems about the same  She has peripheral arterial disease with previous stroke and surgery for her carotids which is stable  She has hypertension which is well controlled Active Problems:   * No active hospital problems. *     Plan: Continue current treatments with rehabilitation.      Elayah Klooster L   08/14/2015, 9:13 AM

## 2015-08-16 ENCOUNTER — Ambulatory Visit (INDEPENDENT_AMBULATORY_CARE_PROVIDER_SITE_OTHER): Payer: Medicare Other | Admitting: Orthopedic Surgery

## 2015-08-16 ENCOUNTER — Encounter: Payer: Self-pay | Admitting: Orthopedic Surgery

## 2015-08-16 VITALS — BP 111/92 | Ht 61.5 in | Wt 113.0 lb

## 2015-08-16 DIAGNOSIS — L03115 Cellulitis of right lower limb: Secondary | ICD-10-CM

## 2015-08-16 DIAGNOSIS — Z4789 Encounter for other orthopedic aftercare: Secondary | ICD-10-CM

## 2015-08-16 NOTE — Progress Notes (Signed)
Chief Complaint  Patient presents with  . Follow-up    POST OP, RT HIP COMPRESSION, DOS 08/01/15    Steri-Strips were applied after staples removed. Patient has what appears to be a subcutaneous hematoma with perhaps secondary cellulitis  Recommend continue Keflex follow-up July 6 for x-ray we do have an x-ray report but no films included with that report so we will x-ray when she comes in to see [place] the film report says there is no complication from the hardware  July 6 [facility]

## 2015-08-16 NOTE — Patient Instructions (Signed)
CONTINUE ANTIBIOTICS

## 2015-08-23 DIAGNOSIS — S72141D Displaced intertrochanteric fracture of right femur, subsequent encounter for closed fracture with routine healing: Secondary | ICD-10-CM | POA: Diagnosis not present

## 2015-08-23 DIAGNOSIS — I2510 Atherosclerotic heart disease of native coronary artery without angina pectoris: Secondary | ICD-10-CM | POA: Diagnosis not present

## 2015-08-23 DIAGNOSIS — J449 Chronic obstructive pulmonary disease, unspecified: Secondary | ICD-10-CM | POA: Diagnosis not present

## 2015-08-23 DIAGNOSIS — M797 Fibromyalgia: Secondary | ICD-10-CM | POA: Diagnosis not present

## 2015-08-23 DIAGNOSIS — I739 Peripheral vascular disease, unspecified: Secondary | ICD-10-CM | POA: Diagnosis not present

## 2015-08-23 DIAGNOSIS — M1990 Unspecified osteoarthritis, unspecified site: Secondary | ICD-10-CM | POA: Diagnosis not present

## 2015-08-23 DIAGNOSIS — F341 Dysthymic disorder: Secondary | ICD-10-CM | POA: Diagnosis not present

## 2015-08-23 DIAGNOSIS — E039 Hypothyroidism, unspecified: Secondary | ICD-10-CM | POA: Diagnosis not present

## 2015-08-23 DIAGNOSIS — F17211 Nicotine dependence, cigarettes, in remission: Secondary | ICD-10-CM | POA: Diagnosis not present

## 2015-08-23 DIAGNOSIS — W19XXXD Unspecified fall, subsequent encounter: Secondary | ICD-10-CM | POA: Diagnosis not present

## 2015-08-25 DIAGNOSIS — M1990 Unspecified osteoarthritis, unspecified site: Secondary | ICD-10-CM | POA: Diagnosis not present

## 2015-08-25 DIAGNOSIS — S72141D Displaced intertrochanteric fracture of right femur, subsequent encounter for closed fracture with routine healing: Secondary | ICD-10-CM | POA: Diagnosis not present

## 2015-08-25 DIAGNOSIS — I2510 Atherosclerotic heart disease of native coronary artery without angina pectoris: Secondary | ICD-10-CM | POA: Diagnosis not present

## 2015-08-25 DIAGNOSIS — I739 Peripheral vascular disease, unspecified: Secondary | ICD-10-CM | POA: Diagnosis not present

## 2015-08-25 DIAGNOSIS — J449 Chronic obstructive pulmonary disease, unspecified: Secondary | ICD-10-CM | POA: Diagnosis not present

## 2015-08-25 DIAGNOSIS — M797 Fibromyalgia: Secondary | ICD-10-CM | POA: Diagnosis not present

## 2015-08-26 ENCOUNTER — Ambulatory Visit: Payer: Medicare Other | Admitting: Orthopedic Surgery

## 2015-08-26 ENCOUNTER — Ambulatory Visit (INDEPENDENT_AMBULATORY_CARE_PROVIDER_SITE_OTHER): Payer: Medicare Other

## 2015-08-26 VITALS — BP 148/61 | HR 70 | Ht 61.5 in | Wt 113.2 lb

## 2015-08-26 DIAGNOSIS — S72141D Displaced intertrochanteric fracture of right femur, subsequent encounter for closed fracture with routine healing: Secondary | ICD-10-CM

## 2015-08-26 NOTE — Progress Notes (Signed)
Chief Complaint  Patient presents with  . Follow-up    Fracture care right hip DOS 08/01/15    3 weeks postop dynamic hip screw inotrope fracture right hip  Compensated by wound cellulitis possible hematoma  X-ray today shows fracture healing and stable and hardware no complications  Wound normal now   Leg lengths are equal   5 weeks xrays

## 2015-08-27 DIAGNOSIS — J449 Chronic obstructive pulmonary disease, unspecified: Secondary | ICD-10-CM | POA: Diagnosis not present

## 2015-08-27 DIAGNOSIS — M797 Fibromyalgia: Secondary | ICD-10-CM | POA: Diagnosis not present

## 2015-08-27 DIAGNOSIS — I2510 Atherosclerotic heart disease of native coronary artery without angina pectoris: Secondary | ICD-10-CM | POA: Diagnosis not present

## 2015-08-27 DIAGNOSIS — I739 Peripheral vascular disease, unspecified: Secondary | ICD-10-CM | POA: Diagnosis not present

## 2015-08-27 DIAGNOSIS — S72141D Displaced intertrochanteric fracture of right femur, subsequent encounter for closed fracture with routine healing: Secondary | ICD-10-CM | POA: Diagnosis not present

## 2015-08-27 DIAGNOSIS — M1990 Unspecified osteoarthritis, unspecified site: Secondary | ICD-10-CM | POA: Diagnosis not present

## 2015-08-30 ENCOUNTER — Other Ambulatory Visit: Payer: Self-pay | Admitting: *Deleted

## 2015-08-30 DIAGNOSIS — S72141D Displaced intertrochanteric fracture of right femur, subsequent encounter for closed fracture with routine healing: Secondary | ICD-10-CM

## 2015-09-01 DIAGNOSIS — M797 Fibromyalgia: Secondary | ICD-10-CM | POA: Diagnosis not present

## 2015-09-01 DIAGNOSIS — M1990 Unspecified osteoarthritis, unspecified site: Secondary | ICD-10-CM | POA: Diagnosis not present

## 2015-09-01 DIAGNOSIS — S72141D Displaced intertrochanteric fracture of right femur, subsequent encounter for closed fracture with routine healing: Secondary | ICD-10-CM | POA: Diagnosis not present

## 2015-09-01 DIAGNOSIS — I2510 Atherosclerotic heart disease of native coronary artery without angina pectoris: Secondary | ICD-10-CM | POA: Diagnosis not present

## 2015-09-01 DIAGNOSIS — I739 Peripheral vascular disease, unspecified: Secondary | ICD-10-CM | POA: Diagnosis not present

## 2015-09-01 DIAGNOSIS — J449 Chronic obstructive pulmonary disease, unspecified: Secondary | ICD-10-CM | POA: Diagnosis not present

## 2015-09-02 DIAGNOSIS — I69914 Frontal lobe and executive function deficit following unspecified cerebrovascular disease: Secondary | ICD-10-CM | POA: Diagnosis not present

## 2015-09-02 DIAGNOSIS — M810 Age-related osteoporosis without current pathological fracture: Secondary | ICD-10-CM | POA: Diagnosis not present

## 2015-09-02 DIAGNOSIS — J449 Chronic obstructive pulmonary disease, unspecified: Secondary | ICD-10-CM | POA: Diagnosis not present

## 2015-09-02 DIAGNOSIS — I10 Essential (primary) hypertension: Secondary | ICD-10-CM | POA: Diagnosis not present

## 2015-09-03 DIAGNOSIS — J449 Chronic obstructive pulmonary disease, unspecified: Secondary | ICD-10-CM | POA: Diagnosis not present

## 2015-09-03 DIAGNOSIS — S72141D Displaced intertrochanteric fracture of right femur, subsequent encounter for closed fracture with routine healing: Secondary | ICD-10-CM | POA: Diagnosis not present

## 2015-09-03 DIAGNOSIS — M1990 Unspecified osteoarthritis, unspecified site: Secondary | ICD-10-CM | POA: Diagnosis not present

## 2015-09-03 DIAGNOSIS — M797 Fibromyalgia: Secondary | ICD-10-CM | POA: Diagnosis not present

## 2015-09-03 DIAGNOSIS — I2510 Atherosclerotic heart disease of native coronary artery without angina pectoris: Secondary | ICD-10-CM | POA: Diagnosis not present

## 2015-09-03 DIAGNOSIS — I739 Peripheral vascular disease, unspecified: Secondary | ICD-10-CM | POA: Diagnosis not present

## 2015-09-06 ENCOUNTER — Ambulatory Visit: Payer: Medicare Other | Admitting: Orthopedic Surgery

## 2015-09-06 ENCOUNTER — Encounter: Payer: Self-pay | Admitting: Orthopedic Surgery

## 2015-09-06 VITALS — BP 140/67 | Ht 61.5 in | Wt 112.0 lb

## 2015-09-06 DIAGNOSIS — S72141D Displaced intertrochanteric fracture of right femur, subsequent encounter for closed fracture with routine healing: Secondary | ICD-10-CM

## 2015-09-06 DIAGNOSIS — Z4789 Encounter for other orthopedic aftercare: Secondary | ICD-10-CM

## 2015-09-06 NOTE — Progress Notes (Signed)
Chief Complaint  Patient presents with  . Follow-up    Right knee and back pain   Status post right hip internal fixation June 11 Routine checkup patient was questioning pain in her right knee after knee replacement questioning injection  She's using icy hot and Advil and that seems to be working well she's having deep pain in the right knee no radicular symptoms  Recommend continue current treatment follow-up for x-ray in August

## 2015-09-07 DIAGNOSIS — M1990 Unspecified osteoarthritis, unspecified site: Secondary | ICD-10-CM | POA: Diagnosis not present

## 2015-09-07 DIAGNOSIS — I2510 Atherosclerotic heart disease of native coronary artery without angina pectoris: Secondary | ICD-10-CM | POA: Diagnosis not present

## 2015-09-07 DIAGNOSIS — J449 Chronic obstructive pulmonary disease, unspecified: Secondary | ICD-10-CM | POA: Diagnosis not present

## 2015-09-07 DIAGNOSIS — M797 Fibromyalgia: Secondary | ICD-10-CM | POA: Diagnosis not present

## 2015-09-07 DIAGNOSIS — I739 Peripheral vascular disease, unspecified: Secondary | ICD-10-CM | POA: Diagnosis not present

## 2015-09-07 DIAGNOSIS — S72141D Displaced intertrochanteric fracture of right femur, subsequent encounter for closed fracture with routine healing: Secondary | ICD-10-CM | POA: Diagnosis not present

## 2015-09-10 DIAGNOSIS — J449 Chronic obstructive pulmonary disease, unspecified: Secondary | ICD-10-CM | POA: Diagnosis not present

## 2015-09-10 DIAGNOSIS — S72141D Displaced intertrochanteric fracture of right femur, subsequent encounter for closed fracture with routine healing: Secondary | ICD-10-CM | POA: Diagnosis not present

## 2015-09-10 DIAGNOSIS — M797 Fibromyalgia: Secondary | ICD-10-CM | POA: Diagnosis not present

## 2015-09-10 DIAGNOSIS — I2510 Atherosclerotic heart disease of native coronary artery without angina pectoris: Secondary | ICD-10-CM | POA: Diagnosis not present

## 2015-09-10 DIAGNOSIS — I739 Peripheral vascular disease, unspecified: Secondary | ICD-10-CM | POA: Diagnosis not present

## 2015-09-10 DIAGNOSIS — M1990 Unspecified osteoarthritis, unspecified site: Secondary | ICD-10-CM | POA: Diagnosis not present

## 2015-09-30 ENCOUNTER — Ambulatory Visit (INDEPENDENT_AMBULATORY_CARE_PROVIDER_SITE_OTHER): Payer: Medicare Other

## 2015-09-30 ENCOUNTER — Ambulatory Visit (INDEPENDENT_AMBULATORY_CARE_PROVIDER_SITE_OTHER): Payer: Medicare Other | Admitting: Orthopedic Surgery

## 2015-09-30 ENCOUNTER — Encounter: Payer: Self-pay | Admitting: Orthopedic Surgery

## 2015-09-30 VITALS — BP 144/68 | HR 89 | Ht 61.5 in | Wt 112.4 lb

## 2015-09-30 DIAGNOSIS — S72141D Displaced intertrochanteric fracture of right femur, subsequent encounter for closed fracture with routine healing: Secondary | ICD-10-CM | POA: Diagnosis not present

## 2015-09-30 NOTE — Progress Notes (Signed)
Chief Complaint  Patient presents with  . Follow-up    Right Hip    Dynamic hip screw internal fixation June 11  X-rays today show fracture healing  Patient having no complaints whatsoever back to the beach  Hip flexion normal leg lengths equal  Released from care

## 2015-10-05 ENCOUNTER — Other Ambulatory Visit (HOSPITAL_COMMUNITY): Payer: Self-pay | Admitting: Pulmonary Disease

## 2015-10-05 ENCOUNTER — Ambulatory Visit (HOSPITAL_COMMUNITY)
Admission: RE | Admit: 2015-10-05 | Discharge: 2015-10-05 | Disposition: A | Discharge status: Home / Self Care | Payer: Medicare Other | Source: Ambulatory Visit | Attending: Pulmonary Disease | Admitting: Pulmonary Disease

## 2015-10-05 DIAGNOSIS — R0602 Shortness of breath: Secondary | ICD-10-CM | POA: Diagnosis not present

## 2015-10-05 DIAGNOSIS — W19XXXA Unspecified fall, initial encounter: Secondary | ICD-10-CM

## 2015-10-05 DIAGNOSIS — R079 Chest pain, unspecified: Secondary | ICD-10-CM | POA: Diagnosis not present

## 2016-01-21 DIAGNOSIS — M542 Cervicalgia: Secondary | ICD-10-CM | POA: Diagnosis not present

## 2016-01-21 DIAGNOSIS — M25572 Pain in left ankle and joints of left foot: Secondary | ICD-10-CM | POA: Diagnosis not present

## 2016-01-21 DIAGNOSIS — M7502 Adhesive capsulitis of left shoulder: Secondary | ICD-10-CM | POA: Diagnosis not present

## 2016-01-26 DIAGNOSIS — F329 Major depressive disorder, single episode, unspecified: Secondary | ICD-10-CM | POA: Diagnosis not present

## 2016-01-26 DIAGNOSIS — K219 Gastro-esophageal reflux disease without esophagitis: Secondary | ICD-10-CM | POA: Diagnosis not present

## 2016-01-26 DIAGNOSIS — E039 Hypothyroidism, unspecified: Secondary | ICD-10-CM | POA: Diagnosis not present

## 2016-01-26 DIAGNOSIS — I639 Cerebral infarction, unspecified: Secondary | ICD-10-CM | POA: Diagnosis not present

## 2016-01-26 DIAGNOSIS — E785 Hyperlipidemia, unspecified: Secondary | ICD-10-CM | POA: Diagnosis not present

## 2016-01-26 DIAGNOSIS — M810 Age-related osteoporosis without current pathological fracture: Secondary | ICD-10-CM | POA: Diagnosis not present

## 2016-01-26 DIAGNOSIS — I69914 Frontal lobe and executive function deficit following unspecified cerebrovascular disease: Secondary | ICD-10-CM | POA: Diagnosis not present

## 2016-01-26 DIAGNOSIS — K6389 Other specified diseases of intestine: Secondary | ICD-10-CM | POA: Diagnosis not present

## 2016-01-26 DIAGNOSIS — I10 Essential (primary) hypertension: Secondary | ICD-10-CM | POA: Diagnosis not present

## 2016-01-26 DIAGNOSIS — J449 Chronic obstructive pulmonary disease, unspecified: Secondary | ICD-10-CM | POA: Diagnosis not present

## 2016-02-16 ENCOUNTER — Other Ambulatory Visit (HOSPITAL_COMMUNITY): Payer: Self-pay | Admitting: Pulmonary Disease

## 2016-02-16 DIAGNOSIS — M810 Age-related osteoporosis without current pathological fracture: Secondary | ICD-10-CM | POA: Diagnosis not present

## 2016-02-16 DIAGNOSIS — J441 Chronic obstructive pulmonary disease with (acute) exacerbation: Secondary | ICD-10-CM | POA: Diagnosis not present

## 2016-02-16 DIAGNOSIS — Z780 Asymptomatic menopausal state: Secondary | ICD-10-CM

## 2016-02-16 DIAGNOSIS — E039 Hypothyroidism, unspecified: Secondary | ICD-10-CM | POA: Diagnosis not present

## 2016-02-16 DIAGNOSIS — I10 Essential (primary) hypertension: Secondary | ICD-10-CM | POA: Diagnosis not present

## 2016-02-18 ENCOUNTER — Ambulatory Visit (HOSPITAL_COMMUNITY)
Admission: RE | Admit: 2016-02-18 | Discharge: 2016-02-18 | Disposition: A | Discharge status: Home / Self Care | Payer: Medicare Other | Source: Ambulatory Visit | Attending: Pulmonary Disease | Admitting: Pulmonary Disease

## 2016-02-18 DIAGNOSIS — M810 Age-related osteoporosis without current pathological fracture: Secondary | ICD-10-CM | POA: Diagnosis not present

## 2016-02-18 DIAGNOSIS — Z780 Asymptomatic menopausal state: Secondary | ICD-10-CM | POA: Insufficient documentation

## 2016-03-23 DIAGNOSIS — L84 Corns and callosities: Secondary | ICD-10-CM | POA: Diagnosis not present

## 2016-03-23 DIAGNOSIS — I70203 Unspecified atherosclerosis of native arteries of extremities, bilateral legs: Secondary | ICD-10-CM | POA: Diagnosis not present

## 2016-04-21 ENCOUNTER — Other Ambulatory Visit (HOSPITAL_COMMUNITY)
Admission: RE | Admit: 2016-04-21 | Discharge: 2016-04-21 | Disposition: A | Discharge status: Home / Self Care | Payer: PPO | Source: Other Acute Inpatient Hospital | Attending: Urology | Admitting: Urology

## 2016-04-21 ENCOUNTER — Ambulatory Visit (INDEPENDENT_AMBULATORY_CARE_PROVIDER_SITE_OTHER): Payer: PPO | Admitting: Urology

## 2016-04-21 DIAGNOSIS — N952 Postmenopausal atrophic vaginitis: Secondary | ICD-10-CM | POA: Diagnosis not present

## 2016-04-21 DIAGNOSIS — N3080 Other cystitis without hematuria: Secondary | ICD-10-CM | POA: Insufficient documentation

## 2016-04-21 DIAGNOSIS — R3914 Feeling of incomplete bladder emptying: Secondary | ICD-10-CM | POA: Diagnosis not present

## 2016-04-21 DIAGNOSIS — N3946 Mixed incontinence: Secondary | ICD-10-CM | POA: Diagnosis not present

## 2016-04-21 LAB — URINALYSIS, COMPLETE (UACMP) WITH MICROSCOPIC
BILIRUBIN URINE: NEGATIVE
Glucose, UA: NEGATIVE mg/dL
HGB URINE DIPSTICK: NEGATIVE
Ketones, ur: NEGATIVE mg/dL
Nitrite: POSITIVE — AB
PH: 5 (ref 5.0–8.0)
Protein, ur: NEGATIVE mg/dL
SPECIFIC GRAVITY, URINE: 1.015 (ref 1.005–1.030)

## 2016-04-24 LAB — URINE CULTURE

## 2016-06-22 DIAGNOSIS — F17200 Nicotine dependence, unspecified, uncomplicated: Secondary | ICD-10-CM | POA: Diagnosis not present

## 2016-06-22 DIAGNOSIS — J449 Chronic obstructive pulmonary disease, unspecified: Secondary | ICD-10-CM | POA: Diagnosis not present

## 2016-06-22 DIAGNOSIS — I10 Essential (primary) hypertension: Secondary | ICD-10-CM | POA: Diagnosis not present

## 2016-06-22 DIAGNOSIS — M810 Age-related osteoporosis without current pathological fracture: Secondary | ICD-10-CM | POA: Diagnosis not present

## 2016-07-07 ENCOUNTER — Ambulatory Visit (INDEPENDENT_AMBULATORY_CARE_PROVIDER_SITE_OTHER): Payer: PPO | Admitting: Urology

## 2016-07-07 ENCOUNTER — Other Ambulatory Visit (HOSPITAL_COMMUNITY)
Admission: RE | Admit: 2016-07-07 | Discharge: 2016-07-07 | Disposition: A | Discharge status: Home / Self Care | Payer: PPO | Source: Other Acute Inpatient Hospital | Attending: Urology | Admitting: Urology

## 2016-07-07 DIAGNOSIS — N3080 Other cystitis without hematuria: Secondary | ICD-10-CM | POA: Insufficient documentation

## 2016-07-07 DIAGNOSIS — N3946 Mixed incontinence: Secondary | ICD-10-CM | POA: Diagnosis not present

## 2016-07-09 LAB — URINE CULTURE: Culture: NO GROWTH

## 2016-07-18 ENCOUNTER — Emergency Department (HOSPITAL_COMMUNITY): Payer: PPO

## 2016-07-18 ENCOUNTER — Encounter (HOSPITAL_COMMUNITY): Payer: Self-pay | Admitting: Emergency Medicine

## 2016-07-18 ENCOUNTER — Emergency Department (HOSPITAL_COMMUNITY)
Admission: EM | Admit: 2016-07-18 | Discharge: 2016-07-18 | Disposition: A | Discharge status: Home / Self Care | Payer: PPO | Attending: Emergency Medicine | Admitting: Emergency Medicine

## 2016-07-18 DIAGNOSIS — M545 Low back pain, unspecified: Secondary | ICD-10-CM

## 2016-07-18 DIAGNOSIS — Y9389 Activity, other specified: Secondary | ICD-10-CM | POA: Insufficient documentation

## 2016-07-18 DIAGNOSIS — F17210 Nicotine dependence, cigarettes, uncomplicated: Secondary | ICD-10-CM | POA: Diagnosis not present

## 2016-07-18 DIAGNOSIS — W1839XA Other fall on same level, initial encounter: Secondary | ICD-10-CM | POA: Insufficient documentation

## 2016-07-18 DIAGNOSIS — S2231XA Fracture of one rib, right side, initial encounter for closed fracture: Secondary | ICD-10-CM | POA: Insufficient documentation

## 2016-07-18 DIAGNOSIS — I10 Essential (primary) hypertension: Secondary | ICD-10-CM | POA: Insufficient documentation

## 2016-07-18 DIAGNOSIS — Z79899 Other long term (current) drug therapy: Secondary | ICD-10-CM | POA: Diagnosis not present

## 2016-07-18 DIAGNOSIS — Z7982 Long term (current) use of aspirin: Secondary | ICD-10-CM | POA: Diagnosis not present

## 2016-07-18 DIAGNOSIS — S299XXA Unspecified injury of thorax, initial encounter: Secondary | ICD-10-CM | POA: Diagnosis not present

## 2016-07-18 DIAGNOSIS — W19XXXA Unspecified fall, initial encounter: Secondary | ICD-10-CM

## 2016-07-18 DIAGNOSIS — Y929 Unspecified place or not applicable: Secondary | ICD-10-CM | POA: Diagnosis not present

## 2016-07-18 DIAGNOSIS — S79911A Unspecified injury of right hip, initial encounter: Secondary | ICD-10-CM | POA: Diagnosis not present

## 2016-07-18 DIAGNOSIS — E039 Hypothyroidism, unspecified: Secondary | ICD-10-CM | POA: Diagnosis not present

## 2016-07-18 DIAGNOSIS — Y999 Unspecified external cause status: Secondary | ICD-10-CM | POA: Insufficient documentation

## 2016-07-18 DIAGNOSIS — F17290 Nicotine dependence, other tobacco product, uncomplicated: Secondary | ICD-10-CM | POA: Diagnosis not present

## 2016-07-18 DIAGNOSIS — J449 Chronic obstructive pulmonary disease, unspecified: Secondary | ICD-10-CM | POA: Diagnosis not present

## 2016-07-18 DIAGNOSIS — M25551 Pain in right hip: Secondary | ICD-10-CM | POA: Diagnosis not present

## 2016-07-18 DIAGNOSIS — S2241XA Multiple fractures of ribs, right side, initial encounter for closed fracture: Secondary | ICD-10-CM | POA: Diagnosis not present

## 2016-07-18 DIAGNOSIS — M542 Cervicalgia: Secondary | ICD-10-CM | POA: Diagnosis not present

## 2016-07-18 MED ORDER — TRAMADOL HCL 50 MG PO TABS
50.0000 mg | ORAL_TABLET | Freq: Once | ORAL | Status: AC
  Administered 2016-07-18: 50 mg via ORAL
  Filled 2016-07-18: qty 1

## 2016-07-18 MED ORDER — ONDANSETRON 4 MG PO TBDP: 4.0000 mg | ORAL_TABLET | Freq: Three times a day (TID) | ORAL | 0 refills | Status: DC | PRN

## 2016-07-18 MED ORDER — HYDROCODONE-ACETAMINOPHEN 5-325 MG PO TABS: 0.5000 | ORAL_TABLET | ORAL | 0 refills | Status: DC | PRN

## 2016-07-18 NOTE — Discharge Instructions (Signed)
Use your incentive spirometer as instructed.  You may try 1/2 to 1 tablet of hydrocodone for pain relief.  Use the zofran if this medicine makes you nauseated.  This pain medicine can also cause constipation.  Make sure you are taking your miralax daily to avoid this complication.

## 2016-07-18 NOTE — ED Triage Notes (Signed)
Pt fell x 5 days ago. Sore to lower back and right posterior mid back. No marking noted.

## 2016-07-18 NOTE — ED Provider Notes (Signed)
[facility] DEPT Provider Note   CSN: [id number] Arrival date & time: 07/18/16  1055     History   Chief Complaint Chief Complaint  Patient presents with  . Fall    HPI [name] is a 81 y.o. female with a history of arthritis and cva presenting with persistent pain in her back, neck and bilateral buttocks since falling 5 days ago.  She stood up to stretch after sitting at a computer for a long period of time and stumbled backward landing on her buttocks but also hitting her back against the wall.  Since then she has had persistent pain which has not improved despite rest and aleve.  She denies hitting her head, denies loc and has had no headaches, dizziness or focal weakness since the event.    HPI  Past Medical History:  Diagnosis Date  . Anxiety   . Arthritis   . Constipation due to pain medication   . COPD (chronic obstructive pulmonary disease) ([place])   . Depression   . Diverticulosis   . Fibromyalgia   . Gastritis   . GERD (gastroesophageal reflux disease)   . Hypertension   . Hypothyroid   . Hypothyroidism   . Osteoarthritis   . Peripheral vascular disease ([place])   . Pneumonia   . PONV (postoperative nausea and vomiting)    severe n/v after every surgery  . Shortness of breath dyspnea   . Stroke [facility])     Patient Active Problem List   Diagnosis Date Noted  . Intertrochanteric fracture of right hip ([place]) 08/01/2015    Class: Acute  . Intertrochanteric fracture of right femur ([place]) 08/01/2015  . Hip fracture ([place]) 07/31/2015  . Abdominal pain 09/16/2014  . SMA stenosis ([place]) 09/15/2014  . Abdominal pain, right upper quadrant 09/15/2014  . Essential hypertension 09/15/2014  . Depression 09/15/2014  . Hypothyroidism 09/15/2014  . GERD without esophagitis 09/15/2014  . Lumbago 11/12/2013  . Stiffness of joint, not elsewhere classified, pelvic region and thigh 11/12/2013  . Muscle weakness (generalized) 11/12/2013  . Lack of coordination 03/21/2013   . Fine motor impairment 03/21/2013  . Left leg weakness 03/17/2013  . Risk for falls 03/17/2013  . Carotid artery disease ([place]) 03/11/2013  . Subclavian artery stenosis ([place]) 03/11/2013  . TIA (transient ischemic attack) 03/11/2013  . Tobacco use disorder 03/11/2013  . CVA (cerebral infarction) 03/05/2013  . Pain in joint, shoulder region 11/01/2010    Past Surgical History:  Procedure Laterality Date  . ABDOMINAL HYSTERECTOMY    . ANTERIOR CERVICAL DECOMP/DISCECTOMY FUSION N/A 04/03/2012   Procedure: ANTERIOR CERVICAL DECOMPRESSION/DISCECTOMY FUSION 2 LEVELS;  Surgeon: Hosie Spangle, MD;  Location: [facility] NEURO ORS;  Service: Neurosurgery;  Laterality: N/A;  Cervical four-five,Cervical five-six anterior cervical decompression with fusion plating and bonegraft  . BACK SURGERY    . CAROTID STENT    . CHOLECYSTECTOMY    . CHOLECYSTECTOMY    . COLONOSCOPY N/A 07/15/2014   Procedure: COLONOSCOPY;  Surgeon: Rogene Houston, MD;  [facility];  Service: Endoscopy;  Laterality: N/A;  200 - moved to 2:10 - Ann to notify pt  . COMPRESSION HIP SCREW Right 08/01/2015   Procedure: COMPRESSION HIP;  Surgeon: Carole Civil, MD;  Location: [facility] ORS;  Service: Orthopedics;  Laterality: Right;  . ESOPHAGOGASTRODUODENOSCOPY N/A 07/15/2014   Procedure: ESOPHAGOGASTRODUODENOSCOPY (EGD);  Surgeon: Rogene Houston, MD;  Location[facility];  Service: Endoscopy;  Laterality: N/A;  . EYE SURGERY  cataract /lens both eyes  . JOINT REPLACEMENT  2012,2011   knees  . PERIPHERAL VASCULAR CATHETERIZATION N/A 10/08/2014   Procedure: Visceral Angiography;  Surgeon: Algernon Huxley, MD;  Location: [facility] CV LAB;  Service: Cardiovascular;  Laterality: N/A;  . PERIPHERAL VASCULAR CATHETERIZATION N/A 10/08/2014   Procedure: Visceral Artery Intervention;  Surgeon: Algernon Huxley, MD;  Location: [facility] CV LAB;  Service: Cardiovascular;  Laterality: N/A;  . ROTATOR CUFF REPAIR    . SHOULDER  SURGERY    . TOTAL KNEE ARTHROPLASTY    . VASCULAR SURGERY      OB History    No data available       Home Medications    Prior to Admission medications   Medication Sig Start Date End Date Taking? Authorizing Provider  Ascorbic Acid (VITAMIN C) 1000 MG tablet Take 1,000 mg by mouth 2 (two) times daily.     [provider]  aspirin EC 325 MG EC tablet Take 1 tablet (325 mg total) by mouth daily with breakfast. 08/04/15   Rosita Fire, MD  B Complex Vitamins (VITAMIN-B COMPLEX PO) Take 1,000 mcg by mouth daily.     [provider]  Biotin 1000 MCG tablet Take 500 mcg by mouth daily.     [provider]  CALCIUM-VITAMIN D PO Take 2 capsules by mouth daily. Calcium 1200mg  + Vitamin D (unknown strength)    [provider]  Celecoxib (CELEBREX PO) Take by mouth.    [provider]  DILT-XR 120 MG 24 hr capsule Take 120 mg by mouth daily.  09/03/14   [provider]  diphenhydrAMINE (SOMINEX) 25 MG tablet Take 25 mg by mouth at bedtime as needed for sleep.    [provider]  DULoxetine (CYMBALTA) 60 MG capsule Take 60 mg by mouth daily.    [provider]  Flaxseed, Linseed, (FLAX SEED OIL) 1000 MG CAPS Take 1 capsule by mouth daily.     [provider]  ibandronate (BONIVA) 150 MG tablet Take 150 mg by mouth every 30 (thirty) days. Take in the morning with a full glass of water, on an empty stomach, and do not take anything else by mouth or lie down for the next 30 min.    [provider]  levothyroxine (SYNTHROID, LEVOTHROID) 88 MCG tablet Take 1 tablet (88 mcg total) by mouth daily. 03/12/13   Love, Ivan Anchors, PA-C  MEGARED OMEGA-3 KRILL OIL PO Take 1 tablet by mouth daily.    [provider]  Multiple Vitamin (MULTIVITAMIN WITH MINERALS) TABS Take 1 tablet by mouth daily.    [provider]  niacin 500 MG tablet Take 500 mg by mouth at bedtime.    [provider]    polyethylene glycol (MIRALAX / GLYCOLAX) packet Take 17 g by mouth every other day.    [provider]    Family History Family History  Problem Relation Age of Onset  . Pancreatic cancer Mother   . Prostate cancer Father     Social History Social History  Substance Use Topics  . Smoking status: Light Tobacco Smoker    Packs/day: 0.50    Years: 45.00    Types: Cigarettes  . Smokeless tobacco: Current User     Comment:  Patient is using the E-Cigarettes  . Alcohol use No     Allergies   Aleve [naproxen sodium]; Gluten meal; Wheat bran; Celebrex [celecoxib]; Codeine; and Morphine and related   Review of  Systems Review of Systems  Constitutional: Negative for fever.  Respiratory: Negative for shortness of breath.   Cardiovascular: Negative for chest pain and leg swelling.  Gastrointestinal: Negative for abdominal distention, abdominal pain and constipation.  Genitourinary: Negative for difficulty urinating, dysuria, flank pain, frequency and urgency.  Musculoskeletal: Positive for arthralgias, back pain, joint swelling and neck pain. Negative for gait problem and myalgias.  Skin: Negative for rash.  Neurological: Negative for weakness and numbness.     Physical Exam Updated Vital Signs BP (!) 176/69 (BP Location: Right Arm)   Pulse 75   Temp 97.5 F (36.4 C) (Oral)   Resp 20   Ht 5' 0.5" (1.537 m)   Wt 53.1 kg (117 lb)   SpO2 95%   BMI 22.47 kg/m   Physical Exam  Constitutional: She appears well-developed and well-nourished.  HENT:  Head: Normocephalic.  Eyes: Conjunctivae are normal.  Neck: Neck supple. Spinous process tenderness present. Decreased range of motion present.  ttp c5-6 level without palpable deformity. Loss of normal lordosis.   Cardiovascular: Normal rate and intact distal pulses.   Pedal pulses normal.  Pulmonary/Chest: Effort normal and breath sounds normal. She has no decreased breath sounds. She has no wheezes. She has no  rhonchi.     She exhibits tenderness.  Abdominal: Soft. She exhibits no distension and no mass.  Musculoskeletal: She exhibits no edema.       Lumbar back: She exhibits tenderness. She exhibits no swelling, no edema, no spasm and normal pulse.  ttp deep buttocks bilaterally and paralumbar.  No pain with hip ROM, flex/ext, internal or external ROM.  Neurological: She is alert. She has normal strength. She displays no atrophy and no tremor. No sensory deficit. Gait normal.  Reflex Scores:      Patellar reflexes are 2+ on the right side and 2+ on the left side.      Achilles reflexes are 2+ on the right side and 2+ on the left side. No strength deficit noted in hip and knee flexor and extensor muscle groups.  Ankle flexion and extension intact.  Skin: Skin is warm and dry. No bruising noted.  Psychiatric: She has a normal mood and affect.  Nursing note and vitals reviewed.    ED Treatments / Results  Labs (all labs ordered are listed, but only abnormal results are displayed) Labs Reviewed - No data to display  EKG  EKG Interpretation None       Radiology Dg Ribs Unilateral W/chest Right  Result Date: 07/18/2016 CLINICAL DATA:  Low back and right-sided rib cage pain since a fall 5 days ago. EXAM: RIGHT RIBS AND CHEST - 3+ VIEW COMPARISON:  Chest x-ray dated October 05, 2015 FINDINGS: The lungs are adequately inflated. There is no pneumothorax, pleural effusion, or pulmonary contusion. The heart and pulmonary vascularity are normal. There is calcification in the wall of the thoracic aorta. There is multilevel degenerative disc disease of the thoracic spine. Right rib detail images reveal a nondisplaced fracture the posterior aspect of the right fifth rib. Old right lateral fourth and fifth rib deformities are stable. IMPRESSION: There is an acute nondisplaced fracture the posterior aspect of the right fifth rib. Old lateral right fourth and fifth rib fractures are observed. There is no  pneumothorax nor other acute cardiopulmonary abnormality. Chronic bronchitic changes are stable. Thoracic aortic atherosclerosis. Electronically Signed   By: David  Martinique M.D.   On: 07/18/2016 12:09   Dg Cervical Spine Complete  Result Date: 07/18/2016  CLINICAL DATA:  [place] 5 days ago with neck pain. EXAM: CERVICAL SPINE - COMPLETE 4+ VIEW COMPARISON:  04/30/2012 FINDINGS: Previous ACDF C4 through C6. Hardware appears the same with slight retraction of 1 screw at C6, unchanged. No evidence of fracture or soft tissue swelling. No malalignment. Chronic disc space narrowing throughout the cervical spine. No bony canal or foraminal narrowing seen however. Aortic calcification incidentally noted. IMPRESSION: No acute or traumatic finding.  Distant ACDF C4 through C6. Electronically Signed   By: Nelson Chimes M.D.   On: 07/18/2016 17:51   Dg Lumbar Spine Complete  Result Date: 07/18/2016 CLINICAL DATA:  Fall, low back pain EXAM: LUMBAR SPINE - COMPLETE 4+ VIEW COMPARISON:  CT abdomen/ pelvis dated 10/22/2014 FINDINGS: Five lumbar-type vertebral bodies. Normal lumbar lordosis. Moderate degenerative changes at L1-2, similar to prior CT, with loss of height of the inferior endplate of L1. Additional mild multilevel degenerative changes of the visualized thoracolumbar spine. No evidence of acute fracture or dislocation. IMPRESSION: No evidence of acute fracture or dislocation. Mild-to-moderate degenerative changes, most prominent at L1-2. Electronically Signed   By: Julian Hy M.D.   On: 07/18/2016 12:06   Dg Hips Bilat W Or Wo Pelvis 5 Views  Result Date: 07/18/2016 CLINICAL DATA:  [place] 5 days ago.  Pelvis and right hip pain. EXAM: DG HIP (WITH OR WITHOUT PELVIS) 5+V BILAT COMPARISON:  09/30/2015 FINDINGS: Previous compression screw and plate fixation of intertrochanteric fracture is completely healed without change in position or alignment. No other pelvic abnormality seen. IMPRESSION: No acute finding.   Old ORIF on the right. Electronically Signed   By: Nelson Chimes M.D.   On: 07/18/2016 17:50    Procedures Procedures (including critical care time)  Medications Ordered in ED Medications  traMADol (ULTRAM) tablet 50 mg (50 mg Oral Given 07/18/16 1619)     Initial Impression / Assessment and Plan / ED Course  I have reviewed the triage vital signs and the nursing notes.  Pertinent labs & imaging results that were available during my care of the patient were reviewed by me and considered in my medical decision making (see chart for details).     Imaging reviewed and discussed with patient.  She was given an incentive spirometer, pt deferred, stating she has one at home. Advised in use.  Given tramadol here without pain relief.  She does not tolerate hydrocodone or oxycodone due to nausea.  Will given hydrocodone with zofran if she develops nausea SE.  She cannot tolerate nsaids either.  Heat tx,  Currently on celebrex.  Advised to make sure she is taking her miralax.   Pt seen by Dr. Lita Mains prior to dc home.  Final Clinical Impressions(s) / ED Diagnoses   Final diagnoses:  Fall, initial encounter  Closed fracture of one rib of right side, initial encounter    New Prescriptions New Prescriptions   HYDROCODONE-ACETAMINOPHEN (NORCO/VICODIN) 5-325 MG TABLET    Take 0.5-1 tablets by mouth every 4 (four) hours as needed for severe pain.   ONDANSETRON (ZOFRAN ODT) 4 MG DISINTEGRATING TABLET    Take 1 tablet (4 mg total) by mouth every 8 (eight) hours as needed for nausea or vomiting.     Evalee Jefferson, PA-C 07/18/16 1850    Julianne Rice, MD 07/27/16 1332

## 2016-07-26 DIAGNOSIS — M9902 Segmental and somatic dysfunction of thoracic region: Secondary | ICD-10-CM | POA: Diagnosis not present

## 2016-07-26 DIAGNOSIS — M47816 Spondylosis without myelopathy or radiculopathy, lumbar region: Secondary | ICD-10-CM | POA: Diagnosis not present

## 2016-07-26 DIAGNOSIS — M47814 Spondylosis without myelopathy or radiculopathy, thoracic region: Secondary | ICD-10-CM | POA: Diagnosis not present

## 2016-07-26 DIAGNOSIS — M9903 Segmental and somatic dysfunction of lumbar region: Secondary | ICD-10-CM | POA: Diagnosis not present

## 2016-07-26 DIAGNOSIS — S335XXA Sprain of ligaments of lumbar spine, initial encounter: Secondary | ICD-10-CM | POA: Diagnosis not present

## 2016-07-27 DIAGNOSIS — M47816 Spondylosis without myelopathy or radiculopathy, lumbar region: Secondary | ICD-10-CM | POA: Diagnosis not present

## 2016-07-27 DIAGNOSIS — S335XXA Sprain of ligaments of lumbar spine, initial encounter: Secondary | ICD-10-CM | POA: Diagnosis not present

## 2016-07-27 DIAGNOSIS — M47814 Spondylosis without myelopathy or radiculopathy, thoracic region: Secondary | ICD-10-CM | POA: Diagnosis not present

## 2016-07-27 DIAGNOSIS — M9903 Segmental and somatic dysfunction of lumbar region: Secondary | ICD-10-CM | POA: Diagnosis not present

## 2016-07-27 DIAGNOSIS — M9902 Segmental and somatic dysfunction of thoracic region: Secondary | ICD-10-CM | POA: Diagnosis not present

## 2016-07-28 ENCOUNTER — Telehealth (HOSPITAL_COMMUNITY): Payer: Self-pay

## 2016-07-28 NOTE — Telephone Encounter (Signed)
Pt agreed to f/u with pcp since her xray showed no compression fracture per Dr. Estanislado Pandy. She stated that she would give [place] a call if something changes. AW

## 2016-08-07 ENCOUNTER — Encounter (HOSPITAL_COMMUNITY)
Admission: RE | Admit: 2016-08-07 | Discharge: 2016-08-07 | Disposition: A | Discharge status: Home / Self Care | Payer: PPO | Source: Ambulatory Visit | Attending: Pulmonary Disease | Admitting: Pulmonary Disease

## 2016-08-07 ENCOUNTER — Other Ambulatory Visit: Payer: Self-pay | Admitting: *Deleted

## 2016-08-07 DIAGNOSIS — M810 Age-related osteoporosis without current pathological fracture: Secondary | ICD-10-CM | POA: Diagnosis not present

## 2016-08-07 LAB — COMPREHENSIVE METABOLIC PANEL
ALBUMIN: 3.9 g/dL (ref 3.5–5.0)
ALK PHOS: 103 U/L (ref 38–126)
ALT: 16 U/L (ref 14–54)
AST: 27 U/L (ref 15–41)
Anion gap: 9 (ref 5–15)
BUN: 14 mg/dL (ref 6–20)
CO2: 30 mmol/L (ref 22–32)
CREATININE: 0.59 mg/dL (ref 0.44–1.00)
Calcium: 9.1 mg/dL (ref 8.9–10.3)
Chloride: 93 mmol/L — ABNORMAL LOW (ref 101–111)
GFR calc Af Amer: >60 mL/min (ref >60)
GFR calc non Af Amer: >60 mL/min (ref >60)
GLUCOSE: 114 mg/dL — AB (ref 65–99)
Potassium: 4.5 mmol/L (ref 3.5–5.1)
SODIUM: 132 mmol/L — AB (ref 135–145)
Total Bilirubin: 0.8 mg/dL (ref 0.3–1.2)
Total Protein: 6.7 g/dL (ref 6.5–8.1)

## 2016-08-07 LAB — MAGNESIUM: Magnesium: 1.7 mg/dL (ref 1.7–2.4)

## 2016-08-07 LAB — PHOSPHORUS: PHOSPHORUS: 4.2 mg/dL (ref 2.5–4.6)

## 2016-08-07 MED ORDER — ZOLEDRONIC ACID 5 MG/100ML IV SOLN
5.0000 mg | Freq: Once | INTRAVENOUS | Status: AC
  Administered 2016-08-07: 5 mg via INTRAVENOUS

## 2016-08-07 MED ORDER — SODIUM CHLORIDE 0.9 % IV SOLN
INTRAVENOUS | Status: DC
  Administered 2016-08-07: 11:00:00 via INTRAVENOUS

## 2016-08-07 MED ORDER — ZOLEDRONIC ACID 5 MG/100ML IV SOLN
INTRAVENOUS | Status: AC
Start: 2016-08-07 — End: 2016-08-07
  Filled 2016-08-07: qty 100

## 2016-08-07 NOTE — Progress Notes (Signed)
Results for [name], [name] (MRN [id number]) as of 08/07/2016 12:19  Ref. Range 08/07/2016 10:51  COMPREHENSIVE METABOLIC PANEL Unknown Rpt (A)  Sodium Latest Ref Range: 135 - 145 mmol/L 132 (L)  Potassium Latest Ref Range: 3.5 - 5.1 mmol/L 4.5  Chloride Latest Ref Range: 101 - 111 mmol/L 93 (L)  CO2 Latest Ref Range: 22 - 32 mmol/L 30  Glucose Latest Ref Range: 65 - 99 mg/dL 114 (H)  BUN Latest Ref Range: 6 - 20 mg/dL 14  Creatinine Latest Ref Range: 0.44 - 1.00 mg/dL 0.59  Calcium Latest Ref Range: 8.9 - 10.3 mg/dL 9.1  Anion gap Latest Ref Range: 5 - 15  9  Phosphorus Latest Ref Range: 2.5 - 4.6 mg/dL 4.2  Magnesium Latest Ref Range: 1.7 - 2.4 mg/dL 1.7  Alkaline Phosphatase Latest Ref Range: 38 - 126 U/L 103  Albumin Latest Ref Range: 3.5 - 5.0 g/dL 3.9  AST Latest Ref Range: 15 - 41 U/L 27  ALT Latest Ref Range: 14 - 54 U/L 16  Total Protein Latest Ref Range: 6.5 - 8.1 g/dL 6.7  Total Bilirubin Latest Ref Range: 0.3 - 1.2 mg/dL 0.8  EGFR (African American) Latest Ref Range: >60 mL/min >60  EGFR (Non-African Amer.) Latest Ref Range: >60 mL/min >60

## 2016-08-07 NOTE — Patient Outreach (Signed)
HTA THN Screen:  Pt sees primary care MD routinely. I note she has COPD but is not on any medication for this. Takes medications as ordered for HTN, OA/Depression. Takes many supplements. Will get her first IV infusion today for osteoporosis. She recently had a fall resulting in a rib fracture. No acute health concerns. No care management needs. Introductory letter sent.  Eulah Pont. Myrtie Neither, MSN, [facility] Gerontological Nurse Practitioner [facility] Care Management [phone]

## 2016-08-14 DIAGNOSIS — S81812A Laceration without foreign body, left lower leg, initial encounter: Secondary | ICD-10-CM | POA: Diagnosis not present

## 2016-08-14 DIAGNOSIS — H10402 Unspecified chronic conjunctivitis, left eye: Secondary | ICD-10-CM | POA: Diagnosis not present

## 2016-08-14 DIAGNOSIS — J449 Chronic obstructive pulmonary disease, unspecified: Secondary | ICD-10-CM | POA: Diagnosis not present

## 2016-08-14 DIAGNOSIS — I10 Essential (primary) hypertension: Secondary | ICD-10-CM | POA: Diagnosis not present

## 2016-08-31 DIAGNOSIS — S81811D Laceration without foreign body, right lower leg, subsequent encounter: Secondary | ICD-10-CM | POA: Diagnosis not present

## 2016-08-31 DIAGNOSIS — J449 Chronic obstructive pulmonary disease, unspecified: Secondary | ICD-10-CM | POA: Diagnosis not present

## 2016-08-31 DIAGNOSIS — I10 Essential (primary) hypertension: Secondary | ICD-10-CM | POA: Diagnosis not present

## 2016-09-01 ENCOUNTER — Other Ambulatory Visit (HOSPITAL_COMMUNITY)
Admission: RE | Admit: 2016-09-01 | Discharge: 2016-09-01 | Disposition: A | Discharge status: Home / Self Care | Payer: PPO | Source: Other Acute Inpatient Hospital | Attending: Urology | Admitting: Urology

## 2016-09-01 ENCOUNTER — Ambulatory Visit (INDEPENDENT_AMBULATORY_CARE_PROVIDER_SITE_OTHER): Payer: PPO | Admitting: Urology

## 2016-09-01 DIAGNOSIS — N3946 Mixed incontinence: Secondary | ICD-10-CM | POA: Diagnosis not present

## 2016-09-01 DIAGNOSIS — N3080 Other cystitis without hematuria: Secondary | ICD-10-CM | POA: Diagnosis not present

## 2016-09-01 LAB — URINALYSIS, COMPLETE (UACMP) WITH MICROSCOPIC
BACTERIA UA: NONE SEEN
Bilirubin Urine: NEGATIVE
GLUCOSE, UA: NEGATIVE mg/dL
Hgb urine dipstick: NEGATIVE
Ketones, ur: NEGATIVE mg/dL
Nitrite: NEGATIVE
PROTEIN: NEGATIVE mg/dL
Specific Gravity, Urine: 1.015 (ref 1.005–1.030)
pH: 6 (ref 5.0–8.0)

## 2016-09-03 LAB — URINE CULTURE: CULTURE: NO GROWTH

## 2016-09-06 ENCOUNTER — Encounter (HOSPITAL_COMMUNITY): Payer: PPO

## 2016-10-13 DIAGNOSIS — M25512 Pain in left shoulder: Secondary | ICD-10-CM | POA: Diagnosis not present

## 2016-10-21 HISTORY — PX: TRANSTHORACIC ECHOCARDIOGRAM: SHX275

## 2016-10-30 DIAGNOSIS — M12812 Other specific arthropathies, not elsewhere classified, left shoulder: Secondary | ICD-10-CM

## 2016-10-30 NOTE — H&P (Signed)
This is a pleasant 81 year-old female who presents to our clinic today with recurrent left shoulder pain.  History of left shoulder end stage degenerative joint disease and left rotator cuff arthropathy.  We saw her last back in December of 2017 where we injected her with Cortisone.  Really minimal relief here.  Still having marked pain and weakness.  She has COPD and had a stroke several years back, but this really didn't affect her walking or arm strength.   Past medical, social and family history reviewed in detail on the patient questionnaire and signed.  Review of systems: As detailed in HPI.  All others reviewed and are negative.   EXAMINATION: Well-developed, well-nourished female in no acute distress.  Alert and oriented x 3.  Examination of her left arm reveals 60% active range of motion.  Shoulder reveals a Type III acromion.  Marked degenerative changes with spurring of the glenohumeral joint.    IMPRESSION: Left shoulder end stage degenerative joint disease with rotator cuff arthropathy.    PLAN: At this point [name] has exhausted conservative treatment and would like to proceed with definitive treatment to include a left reverse shoulder replacement.  We will need to get clearance prior to this.  Risks, benefits and possible complications of surgery have been reviewed.  Rehab and recovery time discussed.  All questions answered.  Paperwork complete.

## 2016-10-31 DIAGNOSIS — M25512 Pain in left shoulder: Secondary | ICD-10-CM | POA: Diagnosis not present

## 2016-11-01 DIAGNOSIS — J449 Chronic obstructive pulmonary disease, unspecified: Secondary | ICD-10-CM | POA: Diagnosis not present

## 2016-11-01 DIAGNOSIS — I10 Essential (primary) hypertension: Secondary | ICD-10-CM | POA: Diagnosis not present

## 2016-11-01 DIAGNOSIS — K219 Gastro-esophageal reflux disease without esophagitis: Secondary | ICD-10-CM | POA: Diagnosis not present

## 2016-11-03 NOTE — Pre-Procedure Instructions (Signed)
TYRISHA BENNINGER  11/03/2016      [id number] - [place], [place] - [address] [place] [address] Phone: [phone] Fax: [place], [place] [address] Phone: [phone] Fax: [phone]  [facility] [place], [place] - [address] AT [address] [place] [address] Phone: [phone] Fax: [phone]    Your procedure is scheduled on September 26  Report to [place] at [phone] A.M.  Call this number if you have problems the morning of surgery:  [phone]   Remember:  Do not eat food or drink liquids after midnight.   Continue all other medications as directed by your physician except follow these medication instructions before surgery   Take these medicines the morning of surgery with A SIP OF WATER  DULoxetine (CYMBALTA) HYDROcodone-acetaminophen (NORCO/VICODIN)  levothyroxine (SYNTHROID, LEVOTHROID)  ondansetron (ZOFRAN ODT)  7 days prior to surgery STOP taking any Aleve, Naproxen, Ibuprofen, Motrin, Advil, Goody's, BC's, all herbal medications, fish oil, and all vitamins Celecoxib (CELEBREX PO)  Follow your doctors instructions regarding your Aspirin.  If no instructions were given by the doctor you will need to call the office to get instructions.  Your pre admission RN will also call for those instructions     Do not wear jewelry, make-up or nail polish.  Do not wear lotions, powders, or perfumes, or deoderant.  Do not shave 48 hours prior to surgery.  Men may shave face and neck.  Do not bring valuables to the hospital.  [facility] is not responsible for any belongings or valuables.  Contacts, dentures or bridgework may not be worn into surgery.  Leave your suitcase in the car.  After surgery it may be brought to your room.  For patients  admitted to the hospital, discharge time will be determined by your treatment team.  Patients discharged the day of surgery will not be allowed to drive home.   Special instructions:   [facility]- Preparing For Surgery  Before surgery, you can play an important role. Because skin is not sterile, your skin needs to be as free of germs as possible. You can reduce the number of germs on your skin by washing with CHG (chlorahexidine gluconate) Soap before surgery.  CHG is an antiseptic cleaner which kills germs and bonds with the skin to continue killing germs even after washing.  Please do not use if you have an allergy to CHG or antibacterial soaps. If your skin becomes reddened/irritated stop using the CHG.  Do not shave (including legs and underarms) for at least 48 hours prior to first CHG shower. It is OK to shave your face.  Please follow these instructions carefully.   1. Shower the NIGHT BEFORE SURGERY and the MORNING OF SURGERY with CHG.   2. If you chose to wash your hair, wash your hair first as usual with your normal shampoo.  3. After you shampoo, rinse your hair and body thoroughly to remove the shampoo.  4. Use CHG as you would any other liquid soap. You can apply CHG directly to the skin and wash gently with a scrungie or a clean washcloth.   5. Apply the CHG Soap to your body ONLY FROM THE NECK DOWN.  Do not use on open wounds or open sores.  Avoid contact with your eyes, ears, mouth and genitals (private parts). Wash genitals (private parts) with your normal soap.  6. Wash thoroughly, paying special attention to the area where your surgery will be performed.  7. Thoroughly rinse your body with warm water from the neck down.  8. DO NOT shower/wash with your normal soap after using and rinsing off the CHG Soap.  9. Pat yourself dry with a CLEAN TOWEL.   10. Wear CLEAN PAJAMAS   11. Place CLEAN SHEETS on your bed the night of your first shower and DO NOT SLEEP WITH  PETS.    Day of Surgery: Do not apply any deodorants/lotions. Please wear clean clothes to the hospital/surgery center.      Please read over the following fact sheets that you were given.

## 2016-11-06 ENCOUNTER — Encounter (HOSPITAL_COMMUNITY)
Admission: RE | Admit: 2016-11-06 | Discharge: 2016-11-06 | Disposition: A | Discharge status: Home / Self Care | Payer: PPO | Source: Ambulatory Visit | Attending: Orthopedic Surgery | Admitting: Orthopedic Surgery

## 2016-11-06 ENCOUNTER — Encounter (HOSPITAL_COMMUNITY): Payer: Self-pay

## 2016-11-06 DIAGNOSIS — Z01812 Encounter for preprocedural laboratory examination: Secondary | ICD-10-CM | POA: Diagnosis not present

## 2016-11-06 DIAGNOSIS — M12812 Other specific arthropathies, not elsewhere classified, left shoulder: Secondary | ICD-10-CM | POA: Diagnosis not present

## 2016-11-06 DIAGNOSIS — Z01810 Encounter for preprocedural cardiovascular examination: Secondary | ICD-10-CM | POA: Insufficient documentation

## 2016-11-06 HISTORY — DX: Unspecified injury of head, initial encounter: S09.90XA

## 2016-11-06 HISTORY — DX: Urge incontinence: N39.41

## 2016-11-06 LAB — URINALYSIS, COMPLETE (UACMP) WITH MICROSCOPIC
BILIRUBIN URINE: NEGATIVE
Bacteria, UA: NONE SEEN
Glucose, UA: NEGATIVE mg/dL
Hgb urine dipstick: NEGATIVE
KETONES UR: NEGATIVE mg/dL
Leukocytes, UA: NEGATIVE
Nitrite: NEGATIVE
PROTEIN: NEGATIVE mg/dL
Specific Gravity, Urine: 1.012 (ref 1.005–1.030)
pH: 6 (ref 5.0–8.0)

## 2016-11-06 LAB — CBC
HCT: 44.3 % (ref 36.0–46.0)
HEMOGLOBIN: 15 g/dL (ref 12.0–15.0)
MCH: 31.4 pg (ref 26.0–34.0)
MCHC: 33.9 g/dL (ref 30.0–36.0)
MCV: 92.9 fL (ref 78.0–100.0)
PLATELETS: 290 10*3/uL (ref 150–400)
RBC: 4.77 MIL/uL (ref 3.87–5.11)
RDW: 13.1 % (ref 11.5–15.5)
WBC: 5.8 10*3/uL (ref 4.0–10.5)

## 2016-11-06 LAB — BASIC METABOLIC PANEL
Anion gap: 7 (ref 5–15)
BUN: 11 mg/dL (ref 6–20)
CHLORIDE: 99 mmol/L — AB (ref 101–111)
CO2: 29 mmol/L (ref 22–32)
CREATININE: 0.67 mg/dL (ref 0.44–1.00)
Calcium: 9.4 mg/dL (ref 8.9–10.3)
GFR calc non Af Amer: >60 mL/min (ref >60)
Glucose, Bld: 96 mg/dL (ref 65–99)
Potassium: 4.4 mmol/L (ref 3.5–5.1)
SODIUM: 135 mmol/L (ref 135–145)

## 2016-11-06 LAB — TYPE AND SCREEN
ABO/RH(D): O POS
ANTIBODY SCREEN: NEGATIVE

## 2016-11-06 LAB — SURGICAL PCR SCREEN
MRSA, PCR: NEGATIVE
Staphylococcus aureus: NEGATIVE

## 2016-11-06 NOTE — Progress Notes (Addendum)
[name] denies chest pain or shortness of breath.  Patient reports that PCP wants patient to see a cardiologist prior to surgery. Patient has an appointment to see Dr Domenic Polite on 10/1, OR is schedulked for 9/26.  I called Dr Luan Pulling and left a message for his scheduler with the information.  I left the chart for anesthesia PA?NP to follow up.

## 2016-11-06 NOTE — Pre-Procedure Instructions (Signed)
[name]  11/06/2016      Your procedure is scheduled on Wednesday, September 26.  Report to [facility] Admitting at 6:45 AM                  Your surgery or procedure is scheduled for 8:45 AM   Call this number if you have problems the morning of surgery: [phone]- pre- op desk                 For any other questions, please call [phone], Monday - Friday 8 AM - 4 PM.   Remember:  Do not eat food or drink liquids after midnight Tuesday, September 25.  Take these medicines the morning of surgery with A SIP OF WATER:  Duloxetine (Cymbalata) levothyroxine (SYNTHROID, LEVOTHROID)    If needed may take :  HYDROcodone-acetaminophen (NORCO/VICODIN) ondansetron (ZOFRAN)                          1 Week prior to surgery STOP taking Aspirin, Aspirin Products (Goody Powder, Excedrin Migraine), Ibuprofen (Advil), Naproxen (Aleve), Vitamins and Herbal Products (ie Fish Oil).  Special instructions:   [place]- Preparing For Surgery  Before surgery, you can play an important role. Because skin is not sterile, your skin needs to be as free of germs as possible. You can reduce the number of germs on your skin by washing with CHG (chlorahexidine gluconate) Soap before surgery.  CHG is an antiseptic cleaner which kills germs and bonds with the skin to continue killing germs even after washing.  Please do not use if you have an allergy to CHG or antibacterial soaps. If your skin becomes reddened/irritated stop using the CHG.  Do not shave (including legs and underarms) for at least 48 hours prior to first CHG shower. It is OK to shave your face.  Please follow these instructions carefully.   1. Shower the NIGHT BEFORE SURGERY and the MORNING OF SURGERY with CHG.   2. If you chose to wash your hair, wash your hair first as usual with your normal shampoo.  3. After you shampoo, rinse your hair and body thoroughly to remove the shampoo.  4. Use CHG as you would any  other liquid soap. You can apply CHG directly to the skin and wash gently with a scrungie or a clean washcloth.   5. Apply the CHG Soap to your body ONLY FROM THE NECK DOWN.  Do not use on open wounds or open sores. Avoid contact with your eyes, ears, mouth and genitals (private parts). Wash genitals (private parts) with your normal soap.  6. Wash thoroughly, paying special attention to the area where your surgery will be performed.  7. Thoroughly rinse your body with warm water from the neck down.  8. DO NOT shower/wash with your normal soap after using and rinsing off the CHG Soap.  9. Pat yourself dry with a CLEAN TOWEL.   10. Wear CLEAN PAJAMAS   11. Place CLEAN SHEETS on your bed the night of your first shower and DO NOT SLEEP WITH PETS.  Day of Surgery: Shower as above Do not apply any deodorants/lotions, powders or colognes. Please wear clean clothes to the hospital/surgery center.    Do not wear jewelry, make-up or nail polish.  Do not shave 48 hours prior to surgery.  Men may shave face and neck.  Do not bring valuables to the hospital.  [facility]  is not responsible for any belongings or valuables.  Contacts, dentures or bridgework may not be worn into surgery.  Leave your suitcase in the car.  After surgery it may be brought to your room.  For patients admitted to the hospital, discharge time will be determined by your treatment team.  Please read over the following fact sheets that you were given Pain Booklet, Patient Instructions for Mupirocin Application, Incentive Spirometry, Surgical Site Infections.

## 2016-11-07 LAB — URINE CULTURE: Culture: NO GROWTH

## 2016-11-07 NOTE — Progress Notes (Addendum)
Anesthesia Chart Review:  Pt is a 81 year old female scheduled for L total shoulder arthroplasty on 11/15/2016 with Kathryne Hitch, MD  - PCP is Sinda Du, MD - Pt seeing cardiologist Glenetta Hew, MD for pre-op eval 11/08/16.   PMH includes:  HTN, stroke (2016), COPD, carotid artery disease (s/p R CEA), PVD (s/p PTA of celiac artery 10/08/14; s/p R subclavian artery angioplasty 02/27/13), hypothyroid, post-op N/V, GERD. Current smoker. BMI 22. S/p R compression hip screw 08/01/15. S/p ACDF 04/03/12  Medications include: ASA 81 mg, Chantix, diltiazem, levothyroxine  BP (!) 158/64 Comment: taken manually  Pulse 81   Temp 36.7 C   Resp 18   Ht 5' 1.5" (1.562 m)   Wt 120 lb 3.2 oz (54.5 kg)   SpO2 95%   BMI 22.34 kg/m   Preoperative labs reviewed.    EKG 11/06/16: NSR. Nonspecific ST and T wave abnormality  Echo 02/28/13:  1. No source of CVA noted.  2. Left ventricular ejection fraction, by visual estimation, is 60 to 65%.  3. Normal global left ventricular systolic function.  4. Impaired relaxation pattern of LV diastolic filling.  5. Normal right ventricular size and systolic function.  6. Mild tricuspid regurgitation.  7. Normal RVSP   Carotid duplex 01/07/13 (found in correspondence in media tab 03/28/13, pg 38):  1. Patent R ICA status post CEA without evidence of restenosis. 2. Subclavian artery stenosis bilaterally. 3. 16-10% LICA stenosis. 4. Significant L ECA stenosis  I spoke with pt by telephone. She has not had a carotid US since 2014.    Will revisit chart after office visit with cardiology 11/08/16.   Willeen Cass, FNP-BC [facility] Short Stay Surgical Center/Anesthesiology Phone: [phone] 11/07/2016 12:25 PM  Addendum:   Pt saw cardiologist Glenetta Hew, MD 11/08/16 for pre-op eval. Echo and carotid duplex ordered, results below.  Dr. Allison Quarry note states pt can proceed with surgery if echo is normal.   Carotid duplex 11/13/16:  - Right: Color duplex  indicates moderate heterogeneous and calcified plaque, with no hemodynamically significant stenosis by duplex criteria in the extracranial cerebrovascular circulation. - Left: Color duplex indicates moderate heterogeneous and calcified plaque, with 50%- 69% stenosis by established duplex criteria. Note that the flow velocities of the left ICA were obtained from an area distal to the maximum narrowing due to the presence of anterior wall plaque with shadowing and may be underestimating the percentage of ICA stenosis. If there is concern for more precise evaluation of degree of stenosis, consider either formal cerebral angiogram, or alternatively, CT angiogram.  Echo 11/10/16:  - Left ventricle: The cavity size was normal. Wall thickness was normal. Systolic function was normal. The estimated ejection fraction was in the range of 60% to 65%. Wall motion was normal; there were no regional wall motion abnormalities. Doppler parameters are consistent with abnormal left ventricular relaxation (grade 1 diastolic dysfunction). - Aortic valve: Trileaflet; mildly calcified leaflets. Left coronary cusp mobility was restricted. - Mitral valve: Mildly calcified annulus. Mildly calcified leaflets. - Right atrium: Central venous pressure (est): 3 mm Hg. - Atrial septum: No defect or patent foramen ovale was identified. - Tricuspid valve: There was trivial regurgitation. - Pulmonary arteries: Systolic pressure could not be accurately estimated. - Pericardium, extracardiac: There was no pericardial effusion. - Impressions: Normal LV wall thickness with LVEF 60-65% and grade 1 diastolic dysfunction. Mildly calcified mitral annulus and leaflets. Sclerotic aortic valve. Trivial tricuspid regurgitation.  If no changes, I anticipate pt can proceed with surgery  as scheduled.   Willeen Cass, FNP-BC [facility] Short Stay Surgical Center/Anesthesiology Phone: [phone] 11/13/2016 1:47 PM

## 2016-11-08 ENCOUNTER — Ambulatory Visit (INDEPENDENT_AMBULATORY_CARE_PROVIDER_SITE_OTHER): Payer: PPO | Admitting: Cardiology

## 2016-11-08 ENCOUNTER — Encounter: Payer: Self-pay | Admitting: Cardiology

## 2016-11-08 VITALS — BP 157/71 | HR 88 | Ht 61.5 in | Wt 120.4 lb

## 2016-11-08 DIAGNOSIS — I10 Essential (primary) hypertension: Secondary | ICD-10-CM

## 2016-11-08 DIAGNOSIS — I771 Stricture of artery: Secondary | ICD-10-CM

## 2016-11-08 DIAGNOSIS — I739 Peripheral vascular disease, unspecified: Secondary | ICD-10-CM

## 2016-11-08 DIAGNOSIS — F17200 Nicotine dependence, unspecified, uncomplicated: Secondary | ICD-10-CM | POA: Diagnosis not present

## 2016-11-08 DIAGNOSIS — Z01810 Encounter for preprocedural cardiovascular examination: Secondary | ICD-10-CM | POA: Diagnosis not present

## 2016-11-08 DIAGNOSIS — I779 Disorder of arteries and arterioles, unspecified: Secondary | ICD-10-CM | POA: Diagnosis not present

## 2016-11-08 DIAGNOSIS — R0609 Other forms of dyspnea: Secondary | ICD-10-CM | POA: Diagnosis not present

## 2016-11-08 NOTE — Patient Instructions (Addendum)
Schedule at [place] has requested that you have an echocardiogram. Echocardiography is a painless test that uses sound waves to create images of your heart. It provides your doctor with information about the size and shape of your heart and how well your heart's chambers and valves are working. This procedure takes approximately one hour. There are no restrictions for this procedure. and Your physician has requested that you have a carotid duplex. This test is an ultrasound of the carotid arteries in your neck. It looks at blood flow through these arteries that supply the brain with blood. Allow one hour for this exam. There are no restrictions or special instructions.  WILL CONTACT YOU ABOUT CLEARANCE FOR SUGERY  WITH DR Kathryne Hitch AFTER ECHOCARDIOGRAM IS COMPLETED.     Your physician recommends that you schedule a follow-up appointment in 2 months with [place] OFFICE

## 2016-11-08 NOTE — Progress Notes (Signed)
PCP: Sinda Du, MD  Clinic Note: Chief Complaint  Patient presents with  . New Patient (Initial Visit)    Pt states no Sx.   . Pre-op Exam    HPI:  [name] is a 81 y.o. female who is being seen today for preoperative evaluation at the request of Sinda Du, MD and Dr. Kathryne Hitch from orthopedics. [name] has a history of  Peripheral and central vascular disease with bilateral plating artery disease,carotid artery disease-left-sided carotid endarterectomy - along with SMA occlusion  With celiac artery  Stenosis treated PTA.  ORRA NOLDE has pending left shoulder surgery after a fall back in May.  Recent Hospitalizations: ER visit May 29 after a fall.  Studies Personally Reviewed - (if available, images/films reviewed: From Epic Chart or Care Everywhere) Lab Results  Component Value Date   CHOL 169 02/28/2013   HDL 57 02/28/2013   LDLCALC 101 (H) 02/28/2013   TRIG 56 02/28/2013    Abdominal Aortic and Visceral Angiogram: 75% stenosis of Celiac Artery - PTA with 6.0 mm balloon; Long segment of SMA occlusion.  Interval History:  [name] is referred here for cardiology evaluation preoperatively. [name] has extensive peripheral vascular disease, has not had any issues or cardiac standpoint that I can tell. [name] has some basal exertional dyspnea which [name] continues to COPD. Other than that, maintenance and that [name] notes is intermittent constipation and diarrhea from her IBS. [name] does water aerobics for about an hour 3 times a week and denies any dyspnea or chest discomfort. [name] really does not have much the way of any residual findings from her stroke.  No recent TIA versus reactive symptoms.  [name] denies any PND orthopnea or edema. No chest pain or shortness of breath with rest or exertion. N No palpitations, lightheadedness, dizziness, weakness or syncope/near syncope. No TIA/amaurosis fugax symptoms. No melena, hematochezia, hematuria, or epstaxis. No  claudication.  ROS: A comprehensive was performed. Review of Systems  HENT: Negative for congestion and nosebleeds.   Respiratory: Positive for cough, shortness of breath (Baseline for her.) and wheezing.   Genitourinary: Negative for hematuria.  Musculoskeletal: Positive for joint pain (Arthritis pains).  Skin:       [name] has a pretty big leg abrasion on the left side with some ecchymosis and avulsion. -A piece of wood hit her leg  Psychiatric/Behavioral: Positive for memory loss. Negative for depression. The patient has insomnia. The patient is not nervous/anxious.   All other systems reviewed and are negative.   I have reviewed and (if needed) personally updated the patient's problem list, medications, allergies, past medical and surgical history, social and family history.   Past Medical History:  Diagnosis Date  . Anxiety   . Arthritis   . Constipation due to pain medication   . COPD (chronic obstructive pulmonary disease) ([place])   . Depression   . Diverticulosis   . Fibromyalgia    Patient does think so,  [facility] dr said I do  . Gastritis   . GERD (gastroesophageal reflux disease)   . Head injury   . Hypertension   . Hypothyroid   . Hypothyroidism   . Osteoarthritis   . Peripheral vascular disease ([place])    Bilateral subclavian artery disease (status post angioplasty). Also status post left CEA. Status post celiac artery PTA.  Marland Kitchen Pneumonia   . PONV (postoperative nausea and vomiting)    severe n/v after every surgery- DId not get sick after surgery 07/2015  . Shortness  of breath dyspnea   . Stroke [facility]) 2016   memory - at times  . Urgency incontinence     Past Surgical History:  Procedure Laterality Date  . ABDOMINAL HYSTERECTOMY    . ANTERIOR CERVICAL DECOMP/DISCECTOMY FUSION N/A 04/03/2012   Procedure: ANTERIOR CERVICAL DECOMPRESSION/DISCECTOMY FUSION 2 LEVELS;  Surgeon: Hosie Spangle, MD;  Location: [place] NEURO ORS;  Service: Neurosurgery;  Laterality: N/A;  Cervical  four-five,Cervical five-six anterior cervical decompression with fusion plating and bonegraft  . BACK SURGERY     lumber-   . CAROTID STENT Left   . CHOLECYSTECTOMY    . COLONOSCOPY N/A 07/15/2014   Procedure: COLONOSCOPY;  Surgeon: Rogene Houston, MD;  [facility];  Service: Endoscopy;  Laterality: N/A;  200 - moved to 2:10 - Ann to notify pt  . COMPRESSION HIP SCREW Right 08/01/2015   Procedure: COMPRESSION HIP;  Surgeon: Carole Civil, MD;  Location: [facility] ORS;  Service: Orthopedics;  Laterality: Right;  . ESOPHAGOGASTRODUODENOSCOPY N/A 07/15/2014   Procedure: ESOPHAGOGASTRODUODENOSCOPY (EGD);  Surgeon: Rogene Houston, MD;  Location: [facility] ENDO SU[facility];  Service: Endoscopy;  Laterality: N/A;  . EYE SURGERY     cataract /lens both eyes  . JOINT REPLACEMENT  2012,2011   knees  . PERIPHERAL VASCULAR CATHETERIZATION N/A 10/08/2014   Procedure: Visceral Angiography;  Surgeon: Algernon Huxley, MD;  Location: [place] CV LAB;  Service: Cardiovascular;  - long stenosis/occlusion of SMA. 75% celiac artery (PTA with 6 mm balloon)  . PERIPHERAL VASCULAR CATHETERIZATION N/A 10/08/2014   Procedure: Visceral Artery Intervention;  Surgeon: Algernon Huxley, MD;  Location: [place] CV LAB;  Service: Cardiovascular;  Laterality: N/A;  . ROTATOR CUFF REPAIR Left    3 times  . ROTATOR CUFF REPAIR Right    3 times  . SHOULDER SURGERY    . TOTAL KNEE ARTHROPLASTY Bilateral   . VASCULAR SURGERY    -  Subclavian angioplasty  Current Meds  Medication Sig  . Ascorbic Acid (VITAMIN C) 1000 MG tablet Take 1,000 mg by mouth 2 (two) times daily.   Marland Kitchen aspirin EC 325 MG EC tablet Take 1 tablet (325 mg total) by mouth daily with breakfast.  . aspirin EC 81 MG tablet Take 81 mg by mouth every other day.  . Calcium 200 MG TABS Take 400 mg by mouth daily.  . CHANTIX 1 MG tablet TAKE 1 TABLET BY MOUTH ONCE DAILY AS NEEDED FOR SMOKING  . diltiazem (DILACOR XR) 120 MG 24 hr capsule Take 120 mg by mouth every  evening.  . diphenhydrAMINE (SOMINEX) 25 MG tablet Take 50 mg by mouth at bedtime.   . DULoxetine (CYMBALTA) 60 MG capsule Take 60 mg by mouth daily.  . Flaxseed, Linseed, (FLAX SEED OIL) 1000 MG CAPS Take 1 capsule by mouth 2 (two) times daily.   Marland Kitchen HYDROcodone-acetaminophen (NORCO/VICODIN) 5-325 MG tablet Take 0.5-1 tablets by mouth every 4 (four) hours as needed for severe pain.  Marland Kitchen levothyroxine (SYNTHROID, LEVOTHROID) 88 MCG tablet Take 1 tablet (88 mcg total) by mouth daily. (Patient taking differently: Take 88 mcg by mouth daily before breakfast. )  . MEGARED OMEGA-3 KRILL OIL PO Take 1 tablet by mouth daily.  . mirabegron ER (MYRBETRIQ) 50 MG TB24 tablet Take 50 mg by mouth daily.  . Multiple Vitamin (MULTIVITAMIN WITH MINERALS) TABS Take 1 tablet by mouth daily.  . niacin 500 MG tablet Take 500 mg by mouth at bedtime.  . ondansetron (ZOFRAN ODT) 4  MG disintegrating tablet Take 1 tablet (4 mg total) by mouth every 8 (eight) hours as needed for nausea or vomiting.  . polyethylene glycol (MIRALAX / GLYCOLAX) packet Take 17 g by mouth daily as needed for mild constipation.     Allergies  Allergen Reactions  . Aleve [Naproxen Sodium] Hives and Palpitations  . Hydrocodone Nausea Only  . Statins Other (See Comments)    Muscle pain  . Gluten Meal   . Wheat Bran     Gluten intolerant  . Celebrex [Celecoxib] Nausea And Vomiting  . Codeine Nausea And Vomiting  . Morphine And Related Nausea And Vomiting    Social History   Social History  . Marital status: Widowed    Spouse name: N/A  . Number of children: 3  . Years of education: 12   Occupational History  . 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   Social History Main Topics  . Smoking status: Light Tobacco Smoker    Packs/day: 0.25    Years: 60.00    Types: Cigarettes  . Smokeless tobacco: Current User  . Alcohol use No  . Drug use: No  . Sexual activity: No   Other Topics Concern  . None   Social  History Narrative    [name] is widowed. [name] is the mother 58 grandchildren. [name] lives alone. 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 [name] exercises at the [facility] pole 45 at 60 minutes of time 3 days a week.    family history includes Pancreatic cancer in her mother; Prostate cancer in her father.  Wt Readings from Last 3 Encounters:  11/08/16 120 lb 6.4 oz (54.6 kg)  11/06/16 120 lb 3.2 oz (54.5 kg)  07/18/16 117 lb (53.1 kg)    PHYSICAL EXAM BP (!) 157/71   Pulse 88   Ht 5' 1.5" (1.562 m)   Wt 120 lb 6.4 oz (54.6 kg)   BMI 22.38 kg/m  Physical Exam  Constitutional: [name] appears well-developed. No distress.  [name] does look a little bit thin and frail. Appears her stated age. Well groomed  HENT:  Head: Normocephalic and atraumatic.  Mouth/Throat: No oropharyngeal exudate.  Eyes: Pupils are equal, round, and reactive to light. Conjunctivae and EOM are normal. No scleral icterus.  Neck: Trachea normal and normal range of motion. Neck supple. No JVD present. Carotid bruit is present (Right carotid; left carotid endarterectomy scar in place). No thyroid mass and no thyromegaly present.  Cardiovascular: Normal rate and regular rhythm.   No extrasystoles are present. PMI is not displaced.  Exam reveals decreased pulses (Bilateral pedal pulses, also radial pulses are diminished.). Exam reveals no gallop.   Murmur heard.  Medium-pitched harsh crescendo-decrescendo early systolic murmur is present with a grade of 1/6  at the upper right sternal border Abdominal: Soft. Bowel sounds are normal.     Adult ECG Report  Rate84 ;  Rhythm: normal sinus rhythm, premature atrial contractions (PAC) and Versus sinus arrhythmia. Also possible fusion beats; Otherwise  Normal axis, intervals indurations. No ischemic ST and T wave changes.  Narrative Interpretation:  Stable irrigated  Other studies Reviewed: Additional studies/ records that were reviewed today include:  Recent Labs:  Lipids not  available. Lab Results  Component Value Date   CREATININE 0.67 11/06/2016   BUN 11 11/06/2016   NA 135 11/06/2016   K 4.4 11/06/2016   CL 99 (L) 11/06/2016   CO2 29 11/06/2016  ASSESSMENT / PLAN: Problem List Items Addressed This Visit    Carotid artery disease ([place]) (Chronic)     Tissue carotid endarterectomy. The followed by vascular surgery. Unfortunately [name] is  Not on a statin, likely because of "intolerance " Simply based on the extent of disease, [name] should be  On some type of antilipid agent      Relevant Orders   EKG 12-Lead (Completed)   ECHOCARDIOGRAM COMPLETE (Completed)   US Carotid Duplex Bilateral   DOE (dyspnea on exertion) (Chronic)    Probably related to COPD. We'll check 2-D echocardiogram to get baseline kyphotic function      Relevant Orders   EKG 12-Lead (Completed)   ECHOCARDIOGRAM COMPLETE (Completed)   US Carotid Duplex Bilateral   Essential hypertension (Chronic)     Her blood pressure is up a bit today. [name] is on diltiazem 120 mg daily. I would like to see what her echo looks like, but for now would prefer given her carotids to allow her blood pressures to stay somewhat elevated      Preop cardiovascular exam - Primary     81 year old woman with extensive peripheral vascular disease, but as far as I can tell has never had any cardiac ischemic evaluation. [name] has a carotid endarterectomy, subclavian PTA, celiac PTA, but no cardiac evaluation.  [name] now presents for preoperative evaluation for shoulder surgery that is low risk.   At this point, [name] does not have any active cardiac symptoms, therefore I don't think it is reasonable to delay a low risk surgery for any significant cardiac evaluation. Because [name] does have some exertional dyspnea (most likely related to COPD), and a soft murmur on exam, we will check a 2-D echocardiogram just to assess correct function moving forward.    Provided her echocardiogram is normal, I don't think we need to  any  Additional preoperative evaluation. Certainly, given her extent of peripheral and central  Vascular/arterial disease, [name] warrants an ischemic evaluation, but I think that in the absence of actual anginal symptoms, that would not be warranted at this time. Recommendation will be to continue diltiazem nd low-dose aspirin.   PREOPERATIVE CARDIAC RISK ASSESSMENT   Revised Cardiac Risk Index:  High Risk Surgery: no; shoulder  Defined as Intraperitoneal, intrathoracic or suprainguinal vascular  Active CAD: no; no angina  CHF: no; euvolemic, no PND/orthopnea  Cerebrovascular Disease: yes; ~TIA/CVA  Diabetes: no; On Insulin: no  CKD (Cr >~ 2): no; no  Total: 1 Estimated Risk of Adverse Outcome: LOW RISK  Estimated Risk of MI, PE, VF/VT (Cardiac Arrest), Complete Heart Block: < 1.5 %   ACC/AHA Guidelines for "Clearance":  Step 1 - Need for Emergency Surgery: No:   If Yes - go straight to OR with perioperative surveillance  Step 2 - Active Cardiac Conditions (Unstable Angina, Decompensated HF, Significant  Arrhytmias - Complete HB, Mobitz II, Symptomatic VT or SVT, Severe Aortic Stenosis - mean gradient > 40 mmHg, Valve area < 1.0 cm2):   No:   If Yes - Evaluate & Treat per ACC/AHA Guidelines  Step 3 -  Low Risk Surgery: Yes  If Yes --> proceed to OR  If No --> Step 4  Step 4 - Functional Capacity >= 4 METS without symptoms: Yes  If Yes --> proceed to OR  If No --> Step 5  Step 5 --  Clinical Risk Factors (CRF)    1-2 or more CRFs: Yes   If Yes -- assess Surgical Risk, -- low  risk shoulder surgery.  Intraabdominal or thoracic vascular surgery --> Proceed to OR, or consider testing if it will change management.  Intermediate Risk: Proceed to OR with HR control, or consider testing if it will change management  No CRFs: No: PAD/CVA  If Yes --> Proceed to OR        Relevant Orders   EKG 12-Lead (Completed)   ECHOCARDIOGRAM COMPLETE (Completed)    Subclavian artery stenosis ([facility]) (Chronic)    In addition to having carotid disease, [name] has subclavian disease and distal artery disease. [name] clearly has atherosclerotic disease diffusely. I don't think that it would be an unreasonable pain to consider an ischemic evaluation, however we should wait until postop to investigate in order to avoid freshly revascularized vessels and need for surgery.In the absence of actual symptoms, there is no indication for doing any evaluation.      Tobacco use disorder (Chronic)     [name] says [name] is down to 3 cigarettes a day, but just can't get about last time. I counseled her on the extent of vascular disease that [name] has in the it is vital that [name]  Finally quits.         [name] has extensive vascular disease, and would benefit from ischemic evaluation, this can be done following her surgery when I see her back.   Current medicines are reviewed at length with the patient today. (+/- concerns) n/a The following changes have been made: n/a  Patient Instructions  Schedule at [facility] has requested that you have an echocardiogram. Echocardiography is a painless test that uses sound waves to create images of your heart. It provides your doctor with information about the size and shape of your heart and how well your heart's chambers and valves are working. This procedure takes approximately one hour. There are no restrictions for this procedure. and Your physician has requested that you have a carotid duplex. This test is an ultrasound of the carotid arteries in your neck. It looks at blood flow through these arteries that supply the brain with blood. Allow one hour for this exam. There are no restrictions or special instructions.  WILL CONTACT YOU ABOUT CLEARANCE FOR SUGERY  WITH DR Kathryne Hitch AFTER ECHOCARDIOGRAM IS COMPLETED.     Your physician recommends that you schedule a follow-up appointment in 2 months with [facility]  OFFICE    Studies Ordered:   Orders Placed This Encounter  Procedures  . US Carotid Duplex Bilateral  . EKG 12-Lead  . ECHOCARDIOGRAM COMPLETE      Glenetta Hew, M.D., M.S. Interventional Cardiologist   Pager # [phone] Phone # [phone] [address]. [place], [place] [address]

## 2016-11-10 ENCOUNTER — Ambulatory Visit (HOSPITAL_COMMUNITY)
Admission: RE | Admit: 2016-11-10 | Discharge: 2016-11-10 | Disposition: A | Discharge status: Home / Self Care | Payer: PPO | Source: Ambulatory Visit | Attending: Cardiology | Admitting: Cardiology

## 2016-11-10 DIAGNOSIS — R0609 Other forms of dyspnea: Secondary | ICD-10-CM

## 2016-11-10 DIAGNOSIS — I779 Disorder of arteries and arterioles, unspecified: Secondary | ICD-10-CM | POA: Diagnosis not present

## 2016-11-10 DIAGNOSIS — Z01810 Encounter for preprocedural cardiovascular examination: Secondary | ICD-10-CM | POA: Diagnosis not present

## 2016-11-10 DIAGNOSIS — I5030 Unspecified diastolic (congestive) heart failure: Secondary | ICD-10-CM | POA: Diagnosis not present

## 2016-11-10 DIAGNOSIS — I080 Rheumatic disorders of both mitral and aortic valves: Secondary | ICD-10-CM | POA: Insufficient documentation

## 2016-11-10 DIAGNOSIS — I739 Peripheral vascular disease, unspecified: Secondary | ICD-10-CM

## 2016-11-10 NOTE — Progress Notes (Signed)
*  PRELIMINARY RESULTS* Echocardiogram 2D Echocardiogram has been performed.  [name] 11/10/2016, 3:55 PM

## 2016-11-11 ENCOUNTER — Encounter: Payer: Self-pay | Admitting: Cardiology

## 2016-11-11 DIAGNOSIS — Z01810 Encounter for preprocedural cardiovascular examination: Secondary | ICD-10-CM | POA: Insufficient documentation

## 2016-11-11 DIAGNOSIS — R0609 Other forms of dyspnea: Secondary | ICD-10-CM

## 2016-11-11 NOTE — Assessment & Plan Note (Signed)
She says she is down to 3 cigarettes a day, but just can't get about last time. I counseled her on the extent of vascular disease that she has in the it is vital that she  Finally quits.

## 2016-11-11 NOTE — Assessment & Plan Note (Signed)
In addition to having carotid disease, she has subclavian disease and distal artery disease. She clearly has atherosclerotic disease diffusely. I don't think that it would be an unreasonable pain to consider an ischemic evaluation, however we should wait until postop to investigate in order to avoid freshly revascularized vessels and need for surgery.In the absence of actual symptoms, there is no indication for doing any evaluation.

## 2016-11-11 NOTE — Assessment & Plan Note (Signed)
Her blood pressure is up a bit today. She is on diltiazem 120 mg daily. I would like to see what her echo looks like, but for now would prefer given her carotids to allow her blood pressures to stay somewhat elevated

## 2016-11-11 NOTE — Assessment & Plan Note (Signed)
Tissue carotid endarterectomy. The followed by vascular surgery. Unfortunately she is  Not on a statin, likely because of "intolerance " Simply based on the extent of disease, she should be  On some type of antilipid agent

## 2016-11-11 NOTE — Assessment & Plan Note (Signed)
Probably related to COPD. We'll check 2-D echocardiogram to get baseline kyphotic function

## 2016-11-11 NOTE — Assessment & Plan Note (Addendum)
81 year old woman with extensive peripheral vascular disease, but as far as I can tell has never had any cardiac ischemic evaluation. She has a carotid endarterectomy, subclavian PTA, celiac PTA, but no cardiac evaluation.  She now presents for preoperative evaluation for shoulder surgery that is low risk.   At this point, she does not have any active cardiac symptoms, therefore I don't think it is reasonable to delay a low risk surgery for any significant cardiac evaluation. Because she does have some exertional dyspnea (most likely related to COPD), and a soft murmur on exam, we will check a 2-D echocardiogram just to assess correct function moving forward.    Provided her echocardiogram is normal, I don't think we need to any  Additional preoperative evaluation. Certainly, given her extent of peripheral and central  Vascular/arterial disease, she warrants an ischemic evaluation, but I think that in the absence of actual anginal symptoms, that would not be warranted at this time. Recommendation will be to continue diltiazem nd low-dose aspirin.   PREOPERATIVE CARDIAC RISK ASSESSMENT   Revised Cardiac Risk Index:  High Risk Surgery: no; shoulder  Defined as Intraperitoneal, intrathoracic or suprainguinal vascular  Active CAD: no; no angina  CHF: no; euvolemic, no PND/orthopnea  Cerebrovascular Disease: yes; ~TIA/CVA  Diabetes: no; On Insulin: no  CKD (Cr >~ 2): no; no  Total: 1 Estimated Risk of Adverse Outcome: LOW RISK  Estimated Risk of MI, PE, VF/VT (Cardiac Arrest), Complete Heart Block: < 1.5 %   ACC/AHA Guidelines for "Clearance":  Step 1 - Need for Emergency Surgery: No:   If Yes - go straight to OR with perioperative surveillance  Step 2 - Active Cardiac Conditions (Unstable Angina, Decompensated HF, Significant  Arrhytmias - Complete HB, Mobitz II, Symptomatic VT or SVT, Severe Aortic Stenosis - mean gradient > 40 mmHg, Valve area < 1.0 cm2):   No:   If Yes -  Evaluate & Treat per ACC/AHA Guidelines  Step 3 -  Low Risk Surgery: Yes  If Yes --> proceed to OR  If No --> Step 4  Step 4 - Functional Capacity >= 4 METS without symptoms: Yes  If Yes --> proceed to OR  If No --> Step 5  Step 5 --  Clinical Risk Factors (CRF)    1-2 or more CRFs: Yes   If Yes -- assess Surgical Risk, -- low risk shoulder surgery.  Intraabdominal or thoracic vascular surgery --> Proceed to OR, or consider testing if it will change management.  Intermediate Risk: Proceed to OR with HR control, or consider testing if it will change management  No CRFs: No: PAD/CVA  If Yes --> Proceed to OR

## 2016-11-12 NOTE — Progress Notes (Signed)
Normal Echo as baseline evaluation Pre-OP.  She was scheduled to see me, but will f/u with a [place] MD for convenience. She has extensive PAD (subclavian, Iliac, Carotid) but has never had a cardiac evaluation. Pre-op for Low Risk - shoulder surgery --> no active Cardiac complaints.  Would recommend proceeding with surgery & plan to continue CAD/Ischemia evaluation in the future as part of routine management given the extent of PAD.  Glenetta Hew, MD

## 2016-11-13 ENCOUNTER — Ambulatory Visit (HOSPITAL_COMMUNITY)
Admission: RE | Admit: 2016-11-13 | Discharge: 2016-11-13 | Disposition: A | Discharge status: Home / Self Care | Payer: PPO | Source: Ambulatory Visit | Attending: Cardiology | Admitting: Cardiology

## 2016-11-13 DIAGNOSIS — I779 Disorder of arteries and arterioles, unspecified: Secondary | ICD-10-CM | POA: Insufficient documentation

## 2016-11-13 DIAGNOSIS — R0609 Other forms of dyspnea: Secondary | ICD-10-CM | POA: Diagnosis not present

## 2016-11-13 DIAGNOSIS — I739 Peripheral vascular disease, unspecified: Secondary | ICD-10-CM

## 2016-11-13 DIAGNOSIS — I6523 Occlusion and stenosis of bilateral carotid arteries: Secondary | ICD-10-CM | POA: Diagnosis not present

## 2016-11-14 ENCOUNTER — Other Ambulatory Visit (HOSPITAL_COMMUNITY): Payer: PPO

## 2016-11-14 MED ORDER — TRANEXAMIC ACID 1000 MG/10ML IV SOLN
2000.0000 mg | INTRAVENOUS | Status: DC
  Filled 2016-11-14 (×2): qty 20

## 2016-11-15 ENCOUNTER — Inpatient Hospital Stay (HOSPITAL_COMMUNITY): Payer: PPO | Admitting: Emergency Medicine

## 2016-11-15 ENCOUNTER — Inpatient Hospital Stay (HOSPITAL_COMMUNITY): Payer: PPO

## 2016-11-15 ENCOUNTER — Inpatient Hospital Stay (HOSPITAL_COMMUNITY)
Admission: RE | Admit: 2016-11-15 | Discharge: 2016-11-16 | DRG: 483 | Disposition: A | Discharge status: Home / Self Care | Payer: PPO | Source: Ambulatory Visit | Attending: Orthopedic Surgery | Admitting: Orthopedic Surgery

## 2016-11-15 ENCOUNTER — Encounter (HOSPITAL_COMMUNITY): Payer: Self-pay | Admitting: Urology

## 2016-11-15 ENCOUNTER — Encounter (HOSPITAL_COMMUNITY)
Admission: RE | Disposition: A | Discharge status: Home / Self Care | Payer: Self-pay | Source: Ambulatory Visit | Attending: Orthopedic Surgery

## 2016-11-15 ENCOUNTER — Inpatient Hospital Stay (HOSPITAL_COMMUNITY): Payer: PPO | Admitting: Anesthesiology

## 2016-11-15 DIAGNOSIS — I739 Peripheral vascular disease, unspecified: Secondary | ICD-10-CM | POA: Diagnosis not present

## 2016-11-15 DIAGNOSIS — E039 Hypothyroidism, unspecified: Secondary | ICD-10-CM | POA: Diagnosis not present

## 2016-11-15 DIAGNOSIS — F17200 Nicotine dependence, unspecified, uncomplicated: Secondary | ICD-10-CM | POA: Diagnosis not present

## 2016-11-15 DIAGNOSIS — M797 Fibromyalgia: Secondary | ICD-10-CM | POA: Diagnosis not present

## 2016-11-15 DIAGNOSIS — M75102 Unspecified rotator cuff tear or rupture of left shoulder, not specified as traumatic: Secondary | ICD-10-CM | POA: Diagnosis present

## 2016-11-15 DIAGNOSIS — M19012 Primary osteoarthritis, left shoulder: Principal | ICD-10-CM | POA: Diagnosis present

## 2016-11-15 DIAGNOSIS — K219 Gastro-esophageal reflux disease without esophagitis: Secondary | ICD-10-CM | POA: Diagnosis not present

## 2016-11-15 DIAGNOSIS — Z96612 Presence of left artificial shoulder joint: Secondary | ICD-10-CM

## 2016-11-15 DIAGNOSIS — I10 Essential (primary) hypertension: Secondary | ICD-10-CM | POA: Diagnosis not present

## 2016-11-15 DIAGNOSIS — M12812 Other specific arthropathies, not elsewhere classified, left shoulder: Secondary | ICD-10-CM

## 2016-11-15 DIAGNOSIS — Z8673 Personal history of transient ischemic attack (TIA), and cerebral infarction without residual deficits: Secondary | ICD-10-CM | POA: Diagnosis not present

## 2016-11-15 DIAGNOSIS — Z471 Aftercare following joint replacement surgery: Secondary | ICD-10-CM | POA: Diagnosis not present

## 2016-11-15 DIAGNOSIS — J449 Chronic obstructive pulmonary disease, unspecified: Secondary | ICD-10-CM | POA: Diagnosis not present

## 2016-11-15 HISTORY — PX: TOTAL SHOULDER ARTHROPLASTY: SHX126

## 2016-11-15 SURGERY — ARTHROPLASTY, SHOULDER, TOTAL
Anesthesia: General | Site: Shoulder | Laterality: Left

## 2016-11-15 MED ORDER — FENTANYL CITRATE (PF) 100 MCG/2ML IJ SOLN: 25.0000 ug | INTRAMUSCULAR | Status: DC | PRN

## 2016-11-15 MED ORDER — SODIUM CHLORIDE 0.9 % IR SOLN
Status: DC | PRN
  Administered 2016-11-15: 3000 mL

## 2016-11-15 MED ORDER — ALBUMIN HUMAN 5 % IV SOLN
INTRAVENOUS | Status: DC | PRN
Start: 2016-11-15 — End: 2016-11-15
  Administered 2016-11-15 (×2): via INTRAVENOUS

## 2016-11-15 MED ORDER — DOCUSATE SODIUM 100 MG PO CAPS
100.0000 mg | ORAL_CAPSULE | Freq: Two times a day (BID) | ORAL | Status: DC
  Administered 2016-11-15 – 2016-11-16 (×3): 100 mg via ORAL
  Filled 2016-11-15 (×3): qty 1

## 2016-11-15 MED ORDER — BISACODYL 5 MG PO TBEC: 5.0000 mg | DELAYED_RELEASE_TABLET | Freq: Every day | ORAL | Status: DC | PRN

## 2016-11-15 MED ORDER — EPHEDRINE SULFATE 50 MG/ML IJ SOLN
INTRAMUSCULAR | Status: AC
  Filled 2016-11-15: qty 1

## 2016-11-15 MED ORDER — POLYETHYLENE GLYCOL 3350 17 G PO PACK
17.0000 g | PACK | Freq: Every day | ORAL | Status: DC | PRN
  Administered 2016-11-16: 17 g via ORAL
  Filled 2016-11-15: qty 1

## 2016-11-15 MED ORDER — DILTIAZEM HCL ER 120 MG PO CP24
120.0000 mg | ORAL_CAPSULE | Freq: Every evening | ORAL | Status: DC
  Administered 2016-11-15: 120 mg via ORAL
  Filled 2016-11-15 (×3): qty 1

## 2016-11-15 MED ORDER — LACTATED RINGERS IV SOLN
INTRAVENOUS | Status: DC
  Administered 2016-11-15 (×2): via INTRAVENOUS

## 2016-11-15 MED ORDER — FENTANYL CITRATE (PF) 250 MCG/5ML IJ SOLN
INTRAMUSCULAR | Status: AC
  Filled 2016-11-15: qty 5

## 2016-11-15 MED ORDER — ONDANSETRON HCL 4 MG/2ML IJ SOLN
INTRAMUSCULAR | Status: DC | PRN
  Administered 2016-11-15: 4 mg via INTRAVENOUS

## 2016-11-15 MED ORDER — HYDROMORPHONE HCL 2 MG PO TABS: ORAL_TABLET | ORAL | 0 refills | Status: DC

## 2016-11-15 MED ORDER — MAGNESIUM CITRATE PO SOLN: 1.0000 | Freq: Once | ORAL | Status: DC | PRN

## 2016-11-15 MED ORDER — SODIUM CHLORIDE 0.9 % IV SOLN
INTRAVENOUS | Status: DC | PRN
  Administered 2016-11-15: 20 mL

## 2016-11-15 MED ORDER — CEFAZOLIN SODIUM-DEXTROSE 2-4 GM/100ML-% IV SOLN
2.0000 g | INTRAVENOUS | Status: AC
  Administered 2016-11-15: 2 g via INTRAVENOUS

## 2016-11-15 MED ORDER — SUCCINYLCHOLINE CHLORIDE 200 MG/10ML IV SOSY
PREFILLED_SYRINGE | INTRAVENOUS | Status: AC
  Filled 2016-11-15: qty 10

## 2016-11-15 MED ORDER — LIDOCAINE 2% (20 MG/ML) 5 ML SYRINGE
INTRAMUSCULAR | Status: DC | PRN
  Administered 2016-11-15: 60 mg via INTRAVENOUS

## 2016-11-15 MED ORDER — CHLORHEXIDINE GLUCONATE 4 % EX LIQD: 60.0000 mL | Freq: Once | CUTANEOUS | Status: DC

## 2016-11-15 MED ORDER — DIPHENHYDRAMINE HCL 12.5 MG/5ML PO ELIX: 12.5000 mg | ORAL_SOLUTION | ORAL | Status: DC | PRN

## 2016-11-15 MED ORDER — DIPHENHYDRAMINE HCL (SLEEP) 25 MG PO TABS: 50.0000 mg | ORAL_TABLET | Freq: Every day | ORAL | Status: DC

## 2016-11-15 MED ORDER — PHENYLEPHRINE HCL 10 MG/ML IJ SOLN
INTRAMUSCULAR | Status: DC | PRN
  Administered 2016-11-15 (×3): 80 ug via INTRAVENOUS
  Administered 2016-11-15 (×4): 40 ug via INTRAVENOUS

## 2016-11-15 MED ORDER — MIDAZOLAM HCL 2 MG/2ML IJ SOLN
INTRAMUSCULAR | Status: AC
Start: 2016-11-15 — End: 2016-11-15
  Filled 2016-11-15: qty 2

## 2016-11-15 MED ORDER — MIRABEGRON ER 25 MG PO TB24
50.0000 mg | ORAL_TABLET | Freq: Every day | ORAL | Status: DC
  Administered 2016-11-16: 50 mg via ORAL
  Filled 2016-11-15: qty 2

## 2016-11-15 MED ORDER — HYDROMORPHONE HCL 1 MG/ML IJ SOLN: 0.5000 mg | INTRAMUSCULAR | Status: DC | PRN

## 2016-11-15 MED ORDER — HYDROMORPHONE HCL 2 MG PO TABS
2.0000 mg | ORAL_TABLET | ORAL | Status: DC | PRN
  Administered 2016-11-15 – 2016-11-16 (×6): 2 mg via ORAL
  Filled 2016-11-15 (×6): qty 1

## 2016-11-15 MED ORDER — PHENYLEPHRINE 40 MCG/ML (10ML) SYRINGE FOR IV PUSH (FOR BLOOD PRESSURE SUPPORT)
PREFILLED_SYRINGE | INTRAVENOUS | Status: AC
  Filled 2016-11-15: qty 10

## 2016-11-15 MED ORDER — ONDANSETRON HCL 4 MG/2ML IJ SOLN
4.0000 mg | Freq: Four times a day (QID) | INTRAMUSCULAR | Status: DC | PRN
  Administered 2016-11-16: 4 mg via INTRAVENOUS
  Filled 2016-11-15: qty 2

## 2016-11-15 MED ORDER — METOCLOPRAMIDE HCL 5 MG PO TABS
5.0000 mg | ORAL_TABLET | Freq: Three times a day (TID) | ORAL | Status: DC | PRN
Start: 2016-11-15 — End: 2016-11-16

## 2016-11-15 MED ORDER — NIACIN 500 MG PO TABS
500.0000 mg | ORAL_TABLET | Freq: Every day | ORAL | Status: DC
  Administered 2016-11-15: 500 mg via ORAL
  Filled 2016-11-15 (×2): qty 1

## 2016-11-15 MED ORDER — LEVOTHYROXINE SODIUM 88 MCG PO TABS
88.0000 ug | ORAL_TABLET | Freq: Every day | ORAL | Status: DC
  Administered 2016-11-16: 88 ug via ORAL
  Filled 2016-11-15: qty 1

## 2016-11-15 MED ORDER — EPHEDRINE SULFATE 50 MG/ML IJ SOLN
INTRAMUSCULAR | Status: DC | PRN
  Administered 2016-11-15 (×2): 10 mg via INTRAVENOUS

## 2016-11-15 MED ORDER — SODIUM CHLORIDE 0.9 % IR SOLN
Status: DC | PRN
  Administered 2016-11-15: 1000 mL

## 2016-11-15 MED ORDER — ONDANSETRON HCL 4 MG PO TABS: 4.0000 mg | ORAL_TABLET | Freq: Four times a day (QID) | ORAL | Status: DC | PRN

## 2016-11-15 MED ORDER — CEFAZOLIN SODIUM-DEXTROSE 1-4 GM/50ML-% IV SOLN
1.0000 g | Freq: Four times a day (QID) | INTRAVENOUS | Status: AC
  Administered 2016-11-15 – 2016-11-16 (×3): 1 g via INTRAVENOUS
  Filled 2016-11-15 (×3): qty 50

## 2016-11-15 MED ORDER — SUCCINYLCHOLINE CHLORIDE 20 MG/ML IJ SOLN
INTRAMUSCULAR | Status: DC | PRN
  Administered 2016-11-15: 100 mg via INTRAVENOUS

## 2016-11-15 MED ORDER — ASPIRIN EC 325 MG PO TBEC
325.0000 mg | DELAYED_RELEASE_TABLET | Freq: Every day | ORAL | Status: DC
  Administered 2016-11-16: 325 mg via ORAL
  Filled 2016-11-15: qty 1

## 2016-11-15 MED ORDER — PROPOFOL 10 MG/ML IV BOLUS
INTRAVENOUS | Status: DC | PRN
  Administered 2016-11-15: 30 mg via INTRAVENOUS
  Administered 2016-11-15: 100 mg via INTRAVENOUS
  Administered 2016-11-15: 30 mg via INTRAVENOUS

## 2016-11-15 MED ORDER — FENTANYL CITRATE (PF) 100 MCG/2ML IJ SOLN
INTRAMUSCULAR | Status: DC | PRN
  Administered 2016-11-15: 50 ug via INTRAVENOUS
  Administered 2016-11-15: 25 ug via INTRAVENOUS
  Administered 2016-11-15: 50 ug via INTRAVENOUS
  Administered 2016-11-15: 75 ug via INTRAVENOUS
  Administered 2016-11-15: 50 ug via INTRAVENOUS

## 2016-11-15 MED ORDER — DEXAMETHASONE SODIUM PHOSPHATE 4 MG/ML IJ SOLN
INTRAMUSCULAR | Status: DC | PRN
  Administered 2016-11-15: 10 mg via INTRAVENOUS

## 2016-11-15 MED ORDER — CEFAZOLIN SODIUM-DEXTROSE 2-4 GM/100ML-% IV SOLN
INTRAVENOUS | Status: AC
  Filled 2016-11-15: qty 100

## 2016-11-15 MED ORDER — TRANEXAMIC ACID 1000 MG/10ML IV SOLN
INTRAVENOUS | Status: AC | PRN
  Administered 2016-11-15: 2000 mg via TOPICAL

## 2016-11-15 MED ORDER — METOCLOPRAMIDE HCL 5 MG/ML IJ SOLN
5.0000 mg | Freq: Three times a day (TID) | INTRAMUSCULAR | Status: DC | PRN
Start: 2016-11-15 — End: 2016-11-16

## 2016-11-15 MED ORDER — ACETAMINOPHEN 650 MG RE SUPP: 650.0000 mg | Freq: Four times a day (QID) | RECTAL | Status: DC | PRN

## 2016-11-15 MED ORDER — DIPHENHYDRAMINE HCL 25 MG PO CAPS
50.0000 mg | ORAL_CAPSULE | Freq: Every day | ORAL | Status: DC
  Administered 2016-11-15: 50 mg via ORAL
  Filled 2016-11-15: qty 2

## 2016-11-15 MED ORDER — POTASSIUM CHLORIDE IN NACL 20-0.9 MEQ/L-% IV SOLN
INTRAVENOUS | Status: DC
  Administered 2016-11-15: 12:00:00 via INTRAVENOUS
  Filled 2016-11-15 (×2): qty 1000

## 2016-11-15 MED ORDER — ACETAMINOPHEN 325 MG PO TABS: 650.0000 mg | ORAL_TABLET | Freq: Four times a day (QID) | ORAL | Status: DC | PRN

## 2016-11-15 MED ORDER — PHENOL 1.4 % MT LIQD: 1.0000 | OROMUCOSAL | Status: DC | PRN

## 2016-11-15 MED ORDER — DEXAMETHASONE SODIUM PHOSPHATE 10 MG/ML IJ SOLN
INTRAMUSCULAR | Status: AC
  Filled 2016-11-15: qty 1

## 2016-11-15 MED ORDER — ONDANSETRON HCL 4 MG/2ML IJ SOLN
INTRAMUSCULAR | Status: AC
  Filled 2016-11-15: qty 2

## 2016-11-15 MED ORDER — LIDOCAINE 2% (20 MG/ML) 5 ML SYRINGE
INTRAMUSCULAR | Status: AC
  Filled 2016-11-15: qty 5

## 2016-11-15 MED ORDER — DULOXETINE HCL 60 MG PO CPEP
60.0000 mg | ORAL_CAPSULE | Freq: Every day | ORAL | Status: DC
  Administered 2016-11-16: 60 mg via ORAL
  Filled 2016-11-15: qty 1

## 2016-11-15 MED ORDER — ONDANSETRON HCL 4 MG PO TABS: 4.0000 mg | ORAL_TABLET | Freq: Three times a day (TID) | ORAL | 0 refills | Status: DC | PRN

## 2016-11-15 MED ORDER — MENTHOL 3 MG MT LOZG: 1.0000 | LOZENGE | OROMUCOSAL | Status: DC | PRN

## 2016-11-15 MED ORDER — TRANEXAMIC ACID 1000 MG/10ML IV SOLN
1000.0000 mg | INTRAVENOUS | Status: DC
  Filled 2016-11-15: qty 10

## 2016-11-15 MED ORDER — ENSURE ENLIVE PO LIQD: 237.0000 mL | Freq: Two times a day (BID) | ORAL | Status: DC

## 2016-11-15 MED ORDER — BUPIVACAINE LIPOSOME 1.3 % IJ SUSP
20.0000 mL | Freq: Once | INTRAMUSCULAR | Status: DC
  Filled 2016-11-15: qty 20

## 2016-11-15 SURGICAL SUPPLY — 61 items
APL SKNCLS STERI-STRIP NONHPOA (GAUZE/BANDAGES/DRESSINGS) ×1
BENZOIN TINCTURE PRP APPL 2/3 (GAUZE/BANDAGES/DRESSINGS) ×2 IMPLANT
BIT DRILL 3.1 DIA REUNION (BIT) IMPLANT
BLADE SAW SGTL 83.5X18.5 (BLADE) ×2 IMPLANT
BOWL SMART MIX CTS (DISPOSABLE) IMPLANT
BRUSH FEMORAL CANAL (MISCELLANEOUS) IMPLANT
BUR SURG 4X8 MED (BURR) IMPLANT
BURR SURG 4X8 MED (BURR)
CAPT SHLDR REVTOTAL 2 ×2 IMPLANT
CLSR STERI-STRIP ANTIMIC 1/2X4 (GAUZE/BANDAGES/DRESSINGS) ×1 IMPLANT
COVER SURGICAL LIGHT HANDLE (MISCELLANEOUS) ×2 IMPLANT
DRAPE IMP U-DRAPE 54X76 (DRAPES) ×2 IMPLANT
DRAPE ORTHO SPLIT 77X108 STRL (DRAPES) ×4
DRAPE SURG ORHT 6 SPLT 77X108 (DRAPES) ×2 IMPLANT
DRAPE U-SHAPE 47X51 STRL (DRAPES) ×2 IMPLANT
DRSG AQUACEL AG ADV 3.5X 6 (GAUZE/BANDAGES/DRESSINGS) ×1 IMPLANT
DRSG AQUACEL AG ADV 3.5X10 (GAUZE/BANDAGES/DRESSINGS) ×2 IMPLANT
DURAPREP 26ML APPLICATOR (WOUND CARE) ×2 IMPLANT
ELECT CAUTERY BLADE 6.4 (BLADE) ×2 IMPLANT
ELECT REM PT RETURN 9FT ADLT (ELECTROSURGICAL) ×2
ELECTRODE REM PT RTRN 9FT ADLT (ELECTROSURGICAL) ×1 IMPLANT
EVACUATOR 1/8 PVC DRAIN (DRAIN) IMPLANT
FACESHIELD WRAPAROUND (MASK) ×4 IMPLANT
FACESHIELD WRAPAROUND OR TEAM (MASK) ×2 IMPLANT
GLOVE BIOGEL PI IND STRL 7.0 (GLOVE) ×1 IMPLANT
GLOVE BIOGEL PI INDICATOR 7.0 (GLOVE) ×1
GLOVE ECLIPSE 7.0 STRL STRAW (GLOVE) ×2 IMPLANT
GLOVE ORTHO TXT STRL SZ7.5 (GLOVE) ×2 IMPLANT
GOWN STRL REUS W/ TWL LRG LVL3 (GOWN DISPOSABLE) ×2 IMPLANT
GOWN STRL REUS W/TWL LRG LVL3 (GOWN DISPOSABLE) ×4
HANDPIECE INTERPULSE COAX TIP (DISPOSABLE)
KIT BASIN OR (CUSTOM PROCEDURE TRAY) ×2 IMPLANT
KIT ROOM TURNOVER OR (KITS) ×2 IMPLANT
MANIFOLD NEPTUNE II (INSTRUMENTS) ×2 IMPLANT
NDL HYPO 25GX1X1/2 BEV (NEEDLE) ×1 IMPLANT
NEEDLE HYPO 25GX1X1/2 BEV (NEEDLE) ×2 IMPLANT
NS IRRIG 1000ML POUR BTL (IV SOLUTION) ×2 IMPLANT
PACK SHOULDER (CUSTOM PROCEDURE TRAY) ×2 IMPLANT
PAD ARMBOARD 7.5X6 YLW CONV (MISCELLANEOUS) ×4 IMPLANT
REUNION NITINOL PILOT WIRE IMPLANT
SET HNDPC FAN SPRY TIP SCT (DISPOSABLE) IMPLANT
SHOULDER CAPITATED REVTOTAL 2 IMPLANT
SLING ARM FOAM STRAP MED (SOFTGOODS) ×1 IMPLANT
SLING ARM IMMOBILIZER LRG (SOFTGOODS) IMPLANT
SLING ARM IMMOBILIZER XL (CAST SUPPLIES) IMPLANT
SPONGE LAP 18X18 X RAY DECT (DISPOSABLE) ×3 IMPLANT
STRIP CLOSURE SKIN 1/2X4 (GAUZE/BANDAGES/DRESSINGS) ×2 IMPLANT
SUCTION FRAZIER HANDLE 10FR (MISCELLANEOUS) ×1
SUCTION TUBE FRAZIER 10FR DISP (MISCELLANEOUS) ×1 IMPLANT
SUT FIBERWIRE #2 38 REV NDL BL (SUTURE) ×4
SUT MNCRL AB 4-0 PS2 18 (SUTURE) ×2 IMPLANT
SUT MON AB 2-0 CT1 36 (SUTURE) ×2 IMPLANT
SUT VIC AB 0 CT1 27 (SUTURE) ×2
SUT VIC AB 0 CT1 27XBRD ANBCTR (SUTURE) ×1 IMPLANT
SUT VIC AB 2-0 CT1 27 (SUTURE) ×2
SUT VIC AB 2-0 CT1 TAPERPNT 27 (SUTURE) ×1 IMPLANT
SUTURE FIBERWR#2 38 REV NDL BL (SUTURE) ×2 IMPLANT
SYR CONTROL 10ML LL (SYRINGE) ×2 IMPLANT
TOWEL OR 17X24 6PK STRL BLUE (TOWEL DISPOSABLE) ×2 IMPLANT
TOWEL OR 17X26 10 PK STRL BLUE (TOWEL DISPOSABLE) ×2 IMPLANT
WATER STERILE IRR 1000ML POUR (IV SOLUTION) ×2 IMPLANT

## 2016-11-15 NOTE — Anesthesia Postprocedure Evaluation (Signed)
Anesthesia Post Note  Patient: [name]  Procedure(s) Performed: Procedure(s) (LRB): LEFT TOTAL SHOULDER ARTHROPLASTY (Left)     Patient location during evaluation: PACU Anesthesia Type: General Level of consciousness: awake Pain management: pain level controlled Vital Signs Assessment: post-procedure vital signs reviewed and stable Respiratory status: spontaneous breathing Cardiovascular status: stable Anesthetic complications: no    Last Vitals:  Vitals:   11/15/16 1205 11/15/16 1210  BP: (!) 164/71 (!) 163/55  Pulse: 88 89  Resp: (!) 22 (!) 22  Temp:  (!) 36.3 C  SpO2: 94% 97%    Last Pain:  Vitals:   11/15/16 1359  TempSrc:   PainSc: 8                  Haidee Stogsdill

## 2016-11-15 NOTE — Anesthesia Preprocedure Evaluation (Addendum)
Anesthesia Evaluation  Patient identified by MRN, date of birth, ID band Patient awake  General Assessment Comment:Patient had COPD and SOB and did not want block. She did not like last experience with shoulder block that caused choking experience reportedly. CG  Reviewed: Allergy & Precautions, NPO status , Patient's Chart, lab work & pertinent test results  History of Anesthesia Complications (+) PONV  Airway Mallampati: II  TM Distance: >3 FB     Dental   Pulmonary shortness of breath, pneumonia, COPD, Current Smoker,    breath sounds clear to auscultation       Cardiovascular hypertension, + Peripheral Vascular Disease and + DOE   Rhythm:Regular Rate:Normal     Neuro/Psych    GI/Hepatic Neg liver ROS, GERD  ,  Endo/Other  Hypothyroidism   Renal/GU negative Renal ROS     Musculoskeletal  (+) Arthritis , Fibromyalgia -  Abdominal   Peds  Hematology   Anesthesia Other Findings   Reproductive/Obstetrics                           Anesthesia Physical Anesthesia Plan  ASA: III  Anesthesia Plan: General   Post-op Pain Management:    Induction: Intravenous  PONV Risk Score and Plan: 3 and Ondansetron, Dexamethasone, Propofol infusion and Treatment may vary due to age or medical condition  Airway Management Planned: Oral ETT  Additional Equipment:   Intra-op Plan:   Post-operative Plan: Possible Post-op intubation/ventilation  Informed Consent: I have reviewed the patients History and Physical, chart, labs and discussed the procedure including the risks, benefits and alternatives for the proposed anesthesia with the patient or authorized representative who has indicated his/her understanding and acceptance.   Dental advisory given  Plan Discussed with: CRNA and Anesthesiologist  Anesthesia Plan Comments:         Anesthesia Quick Evaluation

## 2016-11-15 NOTE — Anesthesia Procedure Notes (Signed)
Procedure Name: Intubation Date/Time: 11/15/2016 9:08 AM Performed by: Lieutenant Diego Pre-anesthesia Checklist: Patient identified, Emergency Drugs available, Suction available and Patient being monitored Patient Re-evaluated:Patient Re-evaluated prior to induction Oxygen Delivery Method: Circle system utilized Preoxygenation: Pre-oxygenation with 100% oxygen Induction Type: IV induction Ventilation: Mask ventilation without difficulty Laryngoscope Size: Miller and 2 Grade View: Grade I Tube type: Oral Tube size: 7.0 mm Number of attempts: 1 Airway Equipment and Method: Stylet and Oral airway Placement Confirmation: ETT inserted through vocal cords under direct vision,  positive ETCO2 and breath sounds checked- equal and bilateral Secured at: 22 cm Tube secured with: Tape Dental Injury: Teeth and Oropharynx as per pre-operative assessment

## 2016-11-15 NOTE — Transfer of Care (Signed)
Immediate Anesthesia Transfer of Care Note  Patient: [name]  Procedure(s) Performed: Procedure(s): LEFT TOTAL SHOULDER ARTHROPLASTY (Left)  Patient Location: PACU  Anesthesia Type:General  Level of Consciousness: awake  Airway & Oxygen Therapy: Patient Spontanous Breathing and Patient connected to face mask oxygen  Post-op Assessment: Report given to RN and Post -op Vital signs reviewed and stable  Post vital signs: Reviewed and stable  Last Vitals:  Vitals:   11/15/16 0655 11/15/16 1110  BP: (!) 156/56 (!) 194/70  Pulse:  90  Resp:  12  Temp:  (!) 36.1 C  SpO2:  96%    Last Pain:  Vitals:   11/15/16 0649  TempSrc: Oral         Complications: No apparent anesthesia complications

## 2016-11-15 NOTE — Discharge Summary (Addendum)
Patient ID: [name] MRN: [id number] DOB/AGE: [birth year] 81 y.o.  Admit date: 11/15/2016 Discharge date: 11/16/2016  Admission Diagnoses:  Principal Problem:   Rotator cuff arthropathy of left shoulder Active Problems:   Essential hypertension   DJD of left shoulder   Discharge Diagnoses:  Same  Past Medical History:  Diagnosis Date  . Anxiety   . Arthritis   . Constipation due to pain medication   . COPD (chronic obstructive pulmonary disease) ([place])   . Depression   . Diverticulosis   . Fibromyalgia    Patient does think so,  [facility] dr said I do  . Gastritis   . GERD (gastroesophageal reflux disease)   . Head injury   . Hypertension   . Hypothyroid   . Hypothyroidism   . Osteoarthritis   . Peripheral vascular disease ([place])    Bilateral subclavian artery disease (status post angioplasty). Also status post left CEA. Status post celiac artery PTA.  Marland Kitchen Pneumonia   . PONV (postoperative nausea and vomiting)    severe n/v after every surgery- DId not get sick after surgery 07/2015  . Shortness of breath dyspnea   . Stroke [facility]) 02/2013   Archie Endo 03/12/2013; memory - at times  . Urgency incontinence     Surgeries: Procedure(s): LEFT TOTAL SHOULDER ARTHROPLASTY on 11/15/2016   Consultants:   Discharged Condition: Improved  Hospital Course: [name] is an 81 y.o. female who was admitted 11/15/2016 for operative treatment ofRotator cuff arthropathy of left shoulder. Patient has severe unremitting pain that affects sleep, daily activities, and work/hobbies. After pre-op clearance the patient was taken to the operating room on 11/15/2016 and underwent  Procedure(s): LEFT TOTAL SHOULDER ARTHROPLASTY.    Patient was given perioperative antibiotics:  Anti-infectives    Start     Dose/Rate Route Frequency Ordered Stop   11/15/16 1430  ceFAZolin (ANCEF) IVPB 1 g/50 mL premix     1 g 100 mL/hr over 30 Minutes Intravenous Every 6 hours 11/15/16 1239 11/16/16 0356    11/15/16 0646  ceFAZolin (ANCEF) 2-4 GM/100ML-% IVPB    Comments:  Rosenberger, Meredit: cabinet override      11/15/16 0646 11/15/16 0850   11/15/16 0643  ceFAZolin (ANCEF) IVPB 2g/100 mL premix     2 g 200 mL/hr over 30 Minutes Intravenous On call to O.R. 11/15/16 4166 11/15/16 0920       Patient was given sequential compression devices, early ambulation, and chemoprophylaxis to prevent DVT.  Patient benefited maximally from hospital stay and there were no complications.    Recent vital signs:  Patient Vitals for the past 24 hrs:  BP Temp Temp src Pulse Resp SpO2  11/16/16 0536 (!) 147/47 98.7 F (37.1 C) Oral 94 - 90 %  11/15/16 2135 (!) 132/44 97.9 F (36.6 C) Oral 92 - 91 %  11/15/16 1500 (!) 142/56 97.6 F (36.4 C) Oral 94 18 93 %  11/15/16 1210 (!) 163/55 (!) 97.3 F (36.3 C) - 89 (!) 22 97 %  11/15/16 1205 (!) 164/71 - - 88 (!) 22 94 %  11/15/16 1155 (!) 167/68 - - 91 18 94 %  11/15/16 1152 (!) 160/66 - - 93 17 96 %  11/15/16 1150 (!) 161/59 - - 90 (!) 21 96 %  11/15/16 1140 (!) 158/65 - - 89 13 96 %  11/15/16 1127 125/85 - - 92 11 97 %  11/15/16 1110 (!) 194/70 (!) 97 F (36.1 C) - 90 12 96 %  Recent laboratory studies:   Recent Labs  11/16/16 0556  WBC 9.8  HGB 10.6*  HCT 31.3*  PLT 239     Discharge Medications:   Allergies as of 11/16/2016      Reactions   Aleve [naproxen Sodium] Hives, Palpitations   Hydrocodone Nausea Only   Statins Other (See Comments)   Muscle pain   Gluten Meal    Wheat Bran    Gluten intolerant   Celebrex [celecoxib] Nausea And Vomiting   Codeine Nausea And Vomiting   Morphine And Related Nausea And Vomiting      Medication List    STOP taking these medications   Flax Seed Oil 1000 MG Caps   HYDROcodone-acetaminophen 5-325 MG tablet Commonly known as:  NORCO/VICODIN   MEGARED OMEGA-3 KRILL OIL PO   ondansetron 4 MG disintegrating tablet Commonly known as:  ZOFRAN ODT   polyethylene glycol packet Commonly  known as:  MIRALAX / GLYCOLAX     TAKE these medications   aspirin EC 81 MG tablet Take 81 mg by mouth every other day.   aspirin 325 MG EC tablet Take 1 tablet (325 mg total) by mouth daily with breakfast.   Calcium 200 MG Tabs Take 400 mg by mouth daily.   CHANTIX 1 MG tablet Generic drug:  varenicline TAKE 1 TABLET BY MOUTH ONCE DAILY AS NEEDED FOR SMOKING   diltiazem 120 MG 24 hr capsule Commonly known as:  DILACOR XR Take 120 mg by mouth every evening.   diphenhydrAMINE 25 MG tablet Commonly known as:  SOMINEX Take 50 mg by mouth at bedtime.   DULoxetine 60 MG capsule Commonly known as:  CYMBALTA Take 60 mg by mouth daily.   HYDROmorphone 2 MG tablet Commonly known as:  DILAUDID Take 1-2 tabs po q4-6 hours prn pain   levothyroxine 88 MCG tablet Commonly known as:  SYNTHROID, LEVOTHROID Take 1 tablet (88 mcg total) by mouth daily. What changed:  when to take this   multivitamin with minerals Tabs tablet Take 1 tablet by mouth daily.   MYRBETRIQ 50 MG Tb24 tablet Generic drug:  mirabegron ER Take 50 mg by mouth daily.   niacin 500 MG tablet Take 500 mg by mouth at bedtime.   ondansetron 4 MG tablet Commonly known as:  ZOFRAN Take 1 tablet (4 mg total) by mouth every 8 (eight) hours as needed for nausea or vomiting.   vitamin C 1000 MG tablet Take 1,000 mg by mouth 2 (two) times daily.            Discharge Care Instructions        Start     Ordered   11/15/16 0000  ondansetron (ZOFRAN) 4 MG tablet  Every 8 hours PRN     11/15/16 1112   11/15/16 0000  HYDROmorphone (DILAUDID) 2 MG tablet     11/15/16 1112      Diagnostic Studies: US Carotid Duplex Bilateral  Result Date: 11/13/2016 CLINICAL DATA:  81 year old female with a history of carotid disease. Cardiovascular risk factors include hypertension, known prior stroke/ TIA, known coronary disease, known vascular disease with prior carotid stenting, hyperlipidemia, tobacco use EXAM: BILATERAL  CAROTID DUPLEX ULTRASOUND TECHNIQUE: Pearline Cables scale imaging, color Doppler and duplex ultrasound were performed of bilateral carotid and vertebral arteries in the neck. COMPARISON:  Duplex 10/22/2010 FINDINGS: Criteria: Quantification of carotid stenosis is based on velocity parameters that correlate the residual internal carotid diameter with NASCET-based stenosis levels, using the diameter of the distal internal carotid  lumen as the denominator for stenosis measurement. The following velocity measurements were obtained: RIGHT ICA:  Systolic 161 cm/sec, Diastolic 26 cm/sec CCA:  096 cm/sec SYSTOLIC ICA/CCA RATIO:  1.1 ECA:  90 cm/sec LEFT ICA:  Systolic 045 cm/sec, Diastolic 28 cm/sec CCA:  98 cm/sec SYSTOLIC ICA/CCA RATIO:  1.7 ECA:  295 cm/sec Right Brachial SBP: Not acquired Left Brachial SBP: Not acquired RIGHT CAROTID ARTERY: Calcifications of the right common carotid artery. Intermediate waveform maintained. Heterogeneous and partially calcified plaque at the right carotid bifurcation. No significant lumen shadowing. Low resistance waveform of the right ICA. Mild tortuosity RIGHT VERTEBRAL ARTERY: Antegrade flow with low resistance waveform. LEFT CAROTID ARTERY: No evidence of stenting of the left carotid system. Calcification of the left common carotid artery. Intermediate waveform maintained. Heterogeneous and partially calcified plaque at the left carotid bifurcation with lumen shadowing. Low resistance waveform of the left ICA. Mild tortuosity LEFT VERTEBRAL ARTERY:  Antegrade flow with low resistance waveform. IMPRESSION: Right: Color duplex indicates moderate heterogeneous and calcified plaque, with no hemodynamically significant stenosis by duplex criteria in the extracranial cerebrovascular circulation. Left: Color duplex indicates moderate heterogeneous and calcified plaque, with 50%- 69% stenosis by established duplex criteria. Note that the flow velocities of the left ICA were obtained from an area  distal to the maximum narrowing due to the presence of anterior wall plaque with shadowing and may be underestimating the percentage of ICA stenosis. If there is concern for more precise evaluation of degree of stenosis, consider either formal cerebral angiogram, or alternatively, CT angiogram. Signed, Dulcy Fanny. Earleen Newport, DO [facility] Electronically Signed   By: Corrie Mckusick D.O.   On: 11/13/2016 13:26   Dg Shoulder Left Port  Result Date: 11/15/2016 CLINICAL DATA:  Status post left shoulder replaced EXAM: LEFT SHOULDER - 1 VIEW COMPARISON:  None. FINDINGS: Left shoulder replacement is identified without malalignment. Visualized lung fields and left ribs are normal P IMPRESSION: Left shoulder replacement without malalignment. Electronically Signed   By: Abelardo Diesel M.D.   On: 11/15/2016 11:42    Disposition: 01-Home or Self Care    Follow-up Information    [name], MD. Schedule an appointment as soon as possible for a visit in 1 week.   Specialty:  Orthopedic Surgery Contact information: [address] [place] [address] [phone]            Signed: Fannie Knee 11/16/2016, 7:04 AM

## 2016-11-15 NOTE — Interval H&P Note (Signed)
History and Physical Interval Note:  11/15/2016 7:37 AM  [name]  has presented today for surgery, with the diagnosis of djd left shoulder  The various methods of treatment have been discussed with the patient and family. After consideration of risks, benefits and other options for treatment, the patient has consented to  Procedure(s): LEFT TOTAL SHOULDER ARTHROPLASTY (Left) as a surgical intervention .  The patient's history has been reviewed, patient examined, no change in status, stable for surgery.  I have reviewed the patient's chart and labs.  Questions were answered to the patient's satisfaction.     [name]

## 2016-11-15 NOTE — Discharge Instructions (Signed)
INSTRUCTIONS AFTER JOINT REPLACEMENT   o Remove items at home which could result in a fall. This includes throw rugs or furniture in walking pathways o ICE to the affected joint every three hours while awake for 30 minutes at a time, for at least the first 3-5 days, and then as needed for pain and swelling.  Continue to use ice for pain and swelling. You may notice swelling that will progress down to the foot and ankle.  This is normal after surgery.  Elevate your leg when you are not up walking on it.   o Continue to use the breathing machine you got in the hospital (incentive spirometer) which will help keep your temperature down.  It is common for your temperature to cycle up and down following surgery, especially at night when you are not up moving around and exerting yourself.  The breathing machine keeps your lungs expanded and your temperature down.   DIET:  As you were doing prior to hospitalization, we recommend a well-balanced diet.  DRESSING / WOUND CARE / SHOWERING  Keep the surgical dressing until follow up.  The dressing is water proof, so you can shower without any extra covering.  IF THE DRESSING FALLS OFF or the wound gets wet inside, change the dressing with sterile gauze.  Please use good hand washing techniques before changing the dressing.  Do not use any lotions or creams on the incision until instructed by your surgeon.       WEIGHT BEARING   Wear sling at all times x 6 weeks     ITCHING:  If you experience itching with your medications, try taking only a single pain pill, or even half a pain pill at a time.  You can also use Benadryl over the counter for itching or also to help with sleep.   TED HOSE STOCKINGS:  Use stockings on both legs until for at least 2 weeks or as directed by physician office. They may be removed at night for sleeping.  MEDICATIONS:  See your medication summary on the After Visit Summary that nursing will review with you.  You may have some  home medications which will be placed on hold until you complete the course of blood thinner medication.  It is important for you to complete the blood thinner medication as prescribed.  PRECAUTIONS:  If you experience chest pain or shortness of breath - call 911 immediately for transfer to the hospital emergency department.   If you develop a fever greater that 101 F, purulent drainage from wound, increased redness or drainage from wound, foul odor from the wound/dressing, or calf pain - CONTACT YOUR SURGEON.                                                   FOLLOW-UP APPOINTMENTS:  If you do not already have a post-op appointment, please call the office for an appointment to be seen by your surgeon.  Guidelines for how soon to be seen are listed in your After Visit Summary, but are typically between 1-4 weeks after surgery.  OTHER INSTRUCTIONS:   Knee Replacement:  Do not place pillow under knee, focus on keeping the knee straight while resting. CPM instructions: 0-90 degrees, 2 hours in the morning, 2 hours in the afternoon, and 2 hours in the evening. Place foam block, curve  side up under heel at all times except when in CPM or when walking.  DO NOT modify, tear, cut, or change the foam block in any way.  MAKE SURE YOU:   Understand these instructions.   Get help right away if you are not doing well or get worse.    Thank you for letting us be a part of your medical care team.  It is a privilege we respect greatly.  We hope these instructions will help you stay on track for a fast and full recovery!

## 2016-11-16 ENCOUNTER — Encounter (HOSPITAL_COMMUNITY): Payer: Self-pay | Admitting: Orthopedic Surgery

## 2016-11-16 LAB — CBC
HCT: 31.3 % — ABNORMAL LOW (ref 36.0–46.0)
HEMOGLOBIN: 10.6 g/dL — AB (ref 12.0–15.0)
MCH: 31.6 pg (ref 26.0–34.0)
MCHC: 33.9 g/dL (ref 30.0–36.0)
MCV: 93.4 fL (ref 78.0–100.0)
Platelets: 239 10*3/uL (ref 150–400)
RBC: 3.35 MIL/uL — AB (ref 3.87–5.11)
RDW: 13.5 % (ref 11.5–15.5)
WBC: 9.8 10*3/uL (ref 4.0–10.5)

## 2016-11-16 NOTE — Therapy (Signed)
Occupational Therapy Evaluation Patient Details Name: [name] MRN: [id number] DOB: [birth year] Today's Date: 11/16/2016    History of Present Illness Pt os a 81 y.o. female s/p left total shoulder arthroplasty and left rotator cuff arthropathy. Per chart review, PMH includes but is not limited to: degenerative joint disease, COPD, anxiety, arthritis, depression, diverticulosis, fibromyyalgia, gasritis, HTN, hypothryoidism, PVD, PONV, SOB, stroke (2016), and urgency incontinence.    Clinical Impression   PTA, pt reports living alone, being independent with ADLs and using a life alert system at home due to increased falls. Currently, pt requires moderate assistance for bathing, dressing, and peri care, and min assist for functional mobility, transfers, and grooming tasks. Pt reports family/neighbors are available intermittently as needed upon d/c home. Pt would benefit from a PT evaluation for safety with mobility and a recent increase in falls. OT will follow acutely to address established goals and to promote a safe d/c home.     Follow Up Recommendations  DC plan and follow up therapy as arranged by surgeon;Supervision/Assistance - 24 hour    Equipment Recommendations  None recommended by OT (Pt has all needed DME)    Recommendations for Other Services PT consult     Precautions / Restrictions Precautions Precautions: Shoulder Type of Shoulder Precautions: Conservative Protocol: NO AROM/PROM left shoudler. AROM wrist, hand, fingers OK.  Shoulder Interventions: Shoulder sling/immobilizer;At all times;Off for dressing/bathing/exercises Precaution Booklet Issued: Yes (comment) Precaution Comments: Reviewed precautions with pt Required Braces or Orthoses: Sling Restrictions Weight Bearing Restrictions: Yes LUE Weight Bearing: Non weight bearing      Mobility Bed Mobility               General bed mobility comments: Pt sitting on BSC with nurse tech upon OT  arrival.  Transfers Overall transfer level: Needs assistance Equipment used: 1 person hand held assist Transfers: Sit to/from Stand Sit to Stand: Min assist         General transfer comment: Min assist to boost up to standing and to maintain balance during functional mobility.     Balance Overall balance assessment: Needs assistance Sitting-balance support: Single extremity supported;Feet supported Sitting balance-Leahy Scale: Fair     Standing balance support: Single extremity supported;During functional activity Standing balance-Leahy Scale: Poor                             ADL either performed or assessed with clinical judgement   ADL Overall ADL's : Needs assistance/impaired     Grooming: Minimal assistance;Sitting Grooming Details (indicate cue type and reason): Pt able to wipe face with washcloth. Assistance for neck Upper Body Bathing: Moderate assistance;Sitting   Lower Body Bathing: Moderate assistance;Sitting/lateral leans   Upper Body Dressing : Moderate assistance;Sitting   Lower Body Dressing: Moderate assistance;Sit to/from stand   Toilet Transfer: Minimal assistance;Ambulation;BSC   Toileting- Clothing Manipulation and Hygiene: Moderate assistance;Sit to/from stand   Tub/ Shower Transfer: Minimal assistance;Ambulation;Shower seat   Functional mobility during ADLs: Minimal assistance General ADL Comments: Pt educated on compensatory techniques to complete ADLs with shoulder preccautions. Pt reports a friend/neighbor is available to assist with bathing and shower transfers upon d/c home.      Vision         Perception     Praxis      Pertinent Vitals/Pain Pain Assessment: Faces Faces Pain Scale: Hurts little more Pain Location: left shoulder Pain Descriptors / Indicators: Discomfort;Grimacing Pain Intervention(s): Monitored during session  Hand Dominance Left   Extremity/Trunk Assessment Upper Extremity Assessment Upper  Extremity Assessment: LUE deficits/detail LUE Deficits / Details: s/p right total shoulder arthroplasty and rotatory cuff repair.  LUE: Unable to fully assess due to immobilization   Lower Extremity Assessment Lower Extremity Assessment: Defer to PT evaluation   Cervical / Trunk Assessment Cervical / Trunk Assessment: Kyphotic   Communication Communication Communication: No difficulties   Cognition Arousal/Alertness: Awake/alert Behavior During Therapy: WFL for tasks assessed/performed Overall Cognitive Status: Within Functional Limits for tasks assessed                                     General Comments  Pt with nausea and vomiting x2 during eval.     Exercises Exercises: Shoulder Shoulder Exercises Elbow Flexion: AROM;10 reps;Standing Elbow Extension: AROM;10 reps;Standing Wrist Flexion: AROM;10 reps;Seated Wrist Extension: AROM;10 reps;Seated Digit Composite Flexion: AROM;10 reps;Seated   Shoulder Instructions Shoulder Instructions Donning/doffing shirt without moving shoulder: Moderate assistance Method for sponge bathing under operated UE: Moderate assistance Donning/doffing sling/immobilizer: Moderate assistance Correct positioning of sling/immobilizer: Moderate assistance ROM for elbow, wrist and digits of operated UE: Supervision/safety Sling wearing schedule (on at all times/off for ADL's): Supervision/safety Proper positioning of operated UE when showering: Minimal assistance Positioning of UE while sleeping: Minimal assistance    Home Living Family/patient expects to be discharged to:: Private residence Living Arrangements: Alone Available Help at Discharge: Family;Neighbor (Sons are available to help intermittently ) Type of Home: House Home Access: Stairs to enter [employer] of Steps: 4 Entrance Stairs-Rails: None Home Layout: One level     Bathroom Shower/Tub: Occupational psychologist: Handicapped height Bathroom  Accessibility: Yes How Accessible: Accessible via walker Home Equipment: Shower seat - built in;Grab bars - toilet;Grab bars - tub/shower (Shower seat comes down from wall. )   Additional Comments: Pt recently had bathroom redone for safety (walk in shower, shower seat)       Prior Functioning/Environment Level of Independence: Independent        Comments: Pt lived alone. Reports having a life alert system due to frequent falls. Sons/neighbors near by to help.        OT Problem List: Decreased strength;Decreased range of motion;Decreased activity tolerance;Impaired balance (sitting and/or standing);Decreased knowledge of use of DME or AE;Decreased knowledge of precautions;Impaired UE functional use;Pain      OT Treatment/Interventions: Self-care/ADL training;Therapeutic activities;Patient/family education;Balance training;Therapeutic exercise    OT Goals(Current goals can be found in the care plan section) Acute Rehab OT Goals Patient Stated Goal: To feel better  OT Goal Formulation: With patient Time For Goal Achievement: 11/30/16 Potential to Achieve Goals: Good ADL Goals Pt Will Perform Upper Body Dressing: with modified independence;sitting Pt Will Perform Lower Body Dressing: with modified independence;sit to/from stand Pt Will Transfer to Toilet: with modified independence;ambulating (Comfort height toilet) Pt Will Perform Toileting - Clothing Manipulation and hygiene: with modified independence;sit to/from stand Pt/caregiver will Perform Home Exercise Program: Increased ROM;Left upper extremity;With written HEP provided (Elbow, wrist, hand flex/ext ) Additional ADL Goal #1: Pt will independently verbalize shoulder precautions and splint wear schedule.   OT Frequency: Min 2X/week   Barriers to D/C:            Co-evaluation              AM-PAC PT "6 Clicks" Daily Activity     Outcome Measure Help from another person eating meals?:  None Help from another person  taking care of personal grooming?: A Little Help from another person toileting, which includes using toliet, bedpan, or urinal?: A Lot Help from another person bathing (including washing, rinsing, drying)?: A Lot Help from another person to put on and taking off regular upper body clothing?: A Lot Help from another person to put on and taking off regular lower body clothing?: A Lot 6 Click Score: 15   End of Session Equipment Utilized During Treatment: Gait belt Nurse Communication: Mobility status (Notified of nausea/vomiting )  Activity Tolerance: Patient tolerated treatment well (Limited due to nausea/vomiting but agreeable to therapy. ) Patient left: in chair;with call bell/phone within reach;with chair alarm set  OT Visit Diagnosis: Unsteadiness on feet (R26.81);Pain Pain - Right/Left: Left Pain - part of body: Shoulder                Time: 0921-1000 OT Time Calculation (min): 39 min Charges:    G-Codes:     Boykin Peek, OTS [phone]   Boykin Peek 11/16/2016, 10:50 AM

## 2016-11-16 NOTE — Care Management (Signed)
Patient has no discharge planning needs.

## 2016-11-16 NOTE — Progress Notes (Signed)
Pt ready for discharge. Education/instructions reviewed with pt and daughter-in-law, and all questions/concerns addressed. IV removed, prescriptions given, and belongings gathered. Pt will be transported out via wheelchair to daughter-in-law's vehicle

## 2016-11-16 NOTE — Progress Notes (Signed)
OT notes for charges    11/16/16 1000  OT Time Calculation  OT Start Time (ACUTE ONLY) 0921  OT Stop Time (ACUTE ONLY) 1000  OT Time Calculation (min) 39 min  OT General Charges  $OT Visit 1 Visit  OT Evaluation  $OT Eval Moderate Complexity 1 Mod  OT Treatments  $Self Care/Home Management  8-22 mins  $Therapeutic Exercise 8-22 mins   Janisa Labus A. Ulice Brilliant, M.S., OTR/L Pager: [phone]

## 2016-11-16 NOTE — Op Note (Signed)
NAMEGLENDOLA, FRIEDHOFF             ACCOUNT NO.:  [id number]  MEDICAL RECORD NO.:  [id number]  LOCATION:                                 FACILITY:  PHYSICIAN:  [name], M.D.      DATE OF BIRTH:  DATE OF PROCEDURE:  11/15/2016 DATE OF DISCHARGE:                              OPERATIVE REPORT   PREOPERATIVE DIAGNOSES:  End-stage arthritis, left shoulder secondary to rotator cuff arthropathy with deficient irreparable rotator cuff tear.  POSTOPERATIVE DIAGNOSES:  End-stage arthritis, left shoulder secondary to rotator cuff arthropathy with deficient irreparable rotator cuff tear.  PROCEDURE:  Reversed total shoulder replacement of left shoulder utilizing Stryker prosthesis.  A 28 mm glenoid base plate screw fixation x5 with a 32 mm +2 mm glenosphere.  A press-fit 13 mm humeral stem with a 17+ 4 metallic cup and a 79+ 4 polyethylene component.  SURGEON:  [name], M.D.  ASSISTANT:  Tawanna Cooler, PA, present throughout the entire case and necessary for timely completion of procedure.  ANESTHESIA:  General.  ESTIMATED BLOOD LOSS:  100 mL.  BLOOD GIVEN:  None.  SPECIMENS:  None.  CULTURES:  None.  COMPLICATIONS:  None.  DRESSINGS:  Soft compressive shoulder immobilizer.  DESCRIPTION OF PROCEDURE:  The patient was brought to the operating room and after adequate anesthesia had been obtained, placed in beach-chair position on the shoulder positioner, prepped and draped in usual sterile fashion.  Deltopectoral incision.  That interval was open.  Retractor put in place.  Hemostasis with cautery.  Completely deficient cuff including the biceps, entire supraspinatus, most of the subscapularis, and the top of the infraspinatus.  Grade 4 changes.  Irreparable cuff debrided.  Conjoined tendon retracted.  Humerus cut and surgical neck. Extremely osteopenic.  Glenoid exposed.  Brought up to good bleeding bone.  Sized and fitted with a 28 mm base plate with a  central 36 mm screw and 4 surrounding screws.  Nice capturing fixation in position.  I attached the 32 glenosphere +2 mm.  Humerus exposed.  Reamed and then fitted with a 13 mm stem at 30 degrees retroversion.  After trials, I used the 32+ 4 cup and a 32+ 4 polyethylene liner.  This gave a nice solid stable reduction.  Although, very tight because of preop findings and migration of the humeral head, I could get full passive motion. There was no subscapularis repair.  Wound irrigated.  Soft tissue was injected with Exparel.  Deltopectoral interval closed.  Subcutaneous and subcuticular closure.  Sterile compressive dressing applied after Marcaine injected in the wound.  Shoulder immobilizer applied. Anesthesia reversed.  Brought to the recovery room.  Tolerated the surgery well with no complications.     [name], M.D.   ______________________________ [name], M.D.    DFM/MEDQ  D:  11/15/2016  T:  11/15/2016  Job:  [id number]

## 2016-11-16 NOTE — Progress Notes (Signed)
Subjective: 1 Day Post-Op Procedure(s) (LRB): LEFT TOTAL SHOULDER ARTHROPLASTY (Left) Patient reports pain as mild.  Doing well this am.  Objective: Vital signs in last 24 hours: Temp:  [97 F (36.1 C)-98.7 F (37.1 C)] 98.7 F (37.1 C) (09/27 0536) Pulse Rate:  [88-94] 94 (09/27 0536) Resp:  [11-22] 18 (09/26 1500) BP: (125-194)/(44-85) 147/47 (09/27 0536) SpO2:  [90 %-97 %] 90 % (09/27 0536)  Intake/Output from previous day: 09/26 0701 - 09/27 0700 In: 1540 [P.O.:240; I.V.:1250; IV Piggyback:50] Out: 300 [Blood:300] Intake/Output this shift: No intake/output data recorded.   Recent Labs  11/16/16 0556  HGB 10.6*    Recent Labs  11/16/16 0556  WBC 9.8  RBC 3.35*  HCT 31.3*  PLT 239   No results for input(s): NA, K, CL, CO2, BUN, CREATININE, GLUCOSE, CALCIUM in the last 72 hours. No results for input(s): LABPT, INR in the last 72 hours.  Neurologically intact Neurovascular intact Sensation intact distally Intact pulses distally Dorsiflexion/Plantar flexion intact Incision: scant drainage No cellulitis present Compartment soft  No swelling and good sensation LUE  Assessment/Plan: 1 Day Post-Op Procedure(s) (LRB): LEFT TOTAL SHOULDER ARTHROPLASTY (Left) Advance diet Up with therapy D/C IV fluids  D/C home this am once finished working with OT LUE- NWB, sling at all times. Ok for wrist rom  Fannie Knee 11/16/2016, 7:02 AM

## 2016-11-16 NOTE — Evaluation (Signed)
Physical Therapy Evaluation Patient Details Name: [name] MRN: [id number] DOB: Jul 09, 1935 Today's Date: 11/16/2016   History of Present Illness  Pt os a 81 y.o. female s/p left total shoulder arthroplasty and left rotator cuff arthropathy. Per chart review, PMH includes but is not limited to: degenerative joint disease, COPD, anxiety, arthritis, depression, diverticulosis, fibromyyalgia, gasritis, HTN, hypothryoidism, PVD, PONV, SOB, stroke (2016), and urgency incontinence.     Clinical Impression  Pt presents with pain and an overall decrease in functional mobility secondary to above. Pt reports that PTA she lived at home alone and is indep with mobility; reports that she can only walk short distances (~1/2 block) before needing to sit and rest, and endorses multiple falls at home secondary to decreased balance. Today, pt heavily reliant on RUE support for safe ambulation in room; trialled amb with HHA, SPC, and eventually determining that R-side hemi-walker provides pt the necessary level of assist; still requires supervision for safety (which pt reports she will have initially at home). Extensive education on AD use and fall risk reduction at home. Feel pt would benefit from using a rollator for all future amb once she is able to WB through LUE; pt in agreement with this secondary to increased falls with no AD. Pt would benefit from continued acute PT services to maximize functional mobility and independence.    Follow Up Recommendations DC plan and follow up therapy as arranged by surgeon;Supervision/Assistance - 24 hour    Equipment Recommendations  Other (comment) (R-side hemi-walker)    Recommendations for Other Services       Precautions / Restrictions Precautions Precautions: Shoulder Type of Shoulder Precautions: Conservative Protocol: NO AROM/PROM left shoudler. AROM wrist, hand, fingers OK.  Shoulder Interventions: Shoulder sling/immobilizer;At all times;Off for  dressing/bathing/exercises Precaution Booklet Issued: No Precaution Comments: Reviewed precautions with pt Required Braces or Orthoses: Sling Restrictions Weight Bearing Restrictions: Yes LUE Weight Bearing: Non weight bearing      Mobility  Bed Mobility               General bed mobility comments: Pt received sitting in chair; reports increased time for bed mob  Transfers Overall transfer level: Needs assistance Equipment used: 1 person hand held assist;Hemi-walker Transfers: Sit to/from Stand Sit to Stand: Min guard         General transfer comment: Reliant on RUE support for standing, initially with HHA, then trialled with right hemi-walker.  Ambulation/Gait Ambulation/Gait assistance: Min guard;Min assist;Supervision Ambulation Distance (Feet): 60 Feet Assistive device: 1 person hand held assist;Hemi-walker Gait Pattern/deviations: Step-through pattern;Decreased stride length;Trunk flexed Gait velocity: Decreased Gait velocity interpretation: <1.8 ft/sec, indicative of risk for recurrent falls General Gait Details: Amb to/from bathroom with R-side HHA, pt heavily reliant on UE support secondary to instability with standing and amb. Amb an additional 81' in room with R-side hemi-walker and SPC; improved stability with R-hemi-walker compared to [facility], which pt agrees. Requires supervision for balance with hemi-walker  Stairs Stairs: Yes   Stair Management: One rail Right;Forwards;Step to pattern Number of Stairs: 4 General stair comments: Simulated ascending 4 steps with RUE support to simulate stairs pt has to enter home with R-side rail; educ on technique and need for supervision with these at home  Wheelchair Mobility    Modified Rankin (Stroke Patients Only)       Balance Overall balance assessment: Needs assistance Sitting-balance support: Single extremity supported;Feet supported Sitting balance-Leahy Scale: Fair     Standing balance support: Single  extremity supported;During functional activity  Standing balance-Leahy Scale: Poor Standing balance comment: Reliant on UE support                             Pertinent Vitals/Pain Pain Assessment: Faces Faces Pain Scale: Hurts little more Pain Location: left shoulder Pain Descriptors / Indicators: Discomfort;Grimacing;Operative site guarding Pain Intervention(s): Monitored during session    Home Living Family/patient expects to be discharged to:: Private residence Living Arrangements: Alone Available Help at Discharge: Family;Neighbor;Available PRN/intermittently Type of Home: House Home Access: Stairs to enter Entrance Stairs-Rails: None Entrance Stairs-Number of Steps: 4 Home Layout: One level Home Equipment: Shower seat - built in;Grab bars - toilet;Grab bars - tub/shower;Cane - quad Additional Comments: Pt recently had bathroom redone for safety (walk in shower, shower seat). Will have intermittent supervision from family upon d/c    Prior Function Level of Independence: Independent         Comments: Pt lived alone. Reports having a life alert system due to frequent falls. Sons/neighbors nearby to help.     Hand Dominance   Dominant Hand: Left    Extremity/Trunk Assessment   Upper Extremity Assessment Upper Extremity Assessment: LUE deficits/detail LUE Deficits / Details: s/p right total shoulder arthroplasty & rotator cuff repair  LUE: Unable to fully assess due to immobilization    Lower Extremity Assessment Lower Extremity Assessment: Generalized weakness    Cervical / Trunk Assessment Cervical / Trunk Assessment: Kyphotic  Communication   Communication: No difficulties  Cognition Arousal/Alertness: Awake/alert Behavior During Therapy: WFL for tasks assessed/performed Overall Cognitive Status: Within Functional Limits for tasks assessed                                        General Comments General comments (skin  integrity, edema, etc.): Pt with nausea and vomiting x2 during eval.     Exercises Shoulder Exercises Elbow Flexion: AROM;10 reps;Standing Elbow Extension: AROM;10 reps;Standing Wrist Flexion: AROM;10 reps;Seated Wrist Extension: AROM;10 reps;Seated Digit Composite Flexion: AROM;10 reps;Seated Donning/doffing shirt without moving shoulder: Moderate assistance Method for sponge bathing under operated UE: Moderate assistance Donning/doffing sling/immobilizer: Moderate assistance Correct positioning of sling/immobilizer: Moderate assistance ROM for elbow, wrist and digits of operated UE: Supervision/safety Sling wearing schedule (on at all times/off for ADL's): Supervision/safety Proper positioning of operated UE when showering: Minimal assistance Positioning of UE while sleeping: Minimal assistance   Assessment/Plan    PT Assessment Patient needs continued PT services  PT Problem List Decreased strength;Decreased range of motion;Decreased activity tolerance;Decreased balance;Decreased mobility;Decreased knowledge of use of DME;Decreased knowledge of precautions;Pain       PT Treatment Interventions DME instruction;Gait training;Stair training;Functional mobility training;Therapeutic activities;Therapeutic exercise;Balance training;Patient/family education    PT Goals (Current goals can be found in the Care Plan section)  Acute Rehab PT Goals Patient Stated Goal: Return home PT Goal Formulation: With patient Time For Goal Achievement: 11/30/16 Potential to Achieve Goals: Good    Frequency Min 5X/week   Barriers to discharge Decreased caregiver support      Co-evaluation               AM-PAC PT "6 Clicks" Daily Activity  Outcome Measure Difficulty turning over in bed (including adjusting bedclothes, sheets and blankets)?: A Little Difficulty moving from lying on back to sitting on the side of the bed? : A Little Difficulty sitting down on and standing up from a  chair  with arms (e.g., wheelchair, bedside commode, etc,.)?: A Little Help needed moving to and from a bed to chair (including a wheelchair)?: A Little Help needed walking in hospital room?: A Little Help needed climbing 3-5 steps with a railing? : A Little 6 Click Score: 18    End of Session Equipment Utilized During Treatment: Gait belt Activity Tolerance: Patient tolerated treatment well;Patient limited by fatigue Patient left: in chair;with call bell/phone within reach Nurse Communication: Mobility status PT Visit Diagnosis: Unsteadiness on feet (R26.81);Other abnormalities of gait and mobility (R26.89)    Time: 4314-2767 PT Time Calculation (min) (ACUTE ONLY): 23 min   Charges:   PT Evaluation $PT Eval Moderate Complexity: 1 Mod PT Treatments $Gait Training: 8-22 mins   PT G Codes:       Mabeline Caras, PT, DPT Acute Rehab Services  Pager: [place] 11/16/2016, 12:23 PM

## 2016-11-18 ENCOUNTER — Emergency Department (HOSPITAL_COMMUNITY): Payer: PPO

## 2016-11-18 ENCOUNTER — Encounter (HOSPITAL_COMMUNITY): Payer: Self-pay | Admitting: Emergency Medicine

## 2016-11-18 ENCOUNTER — Observation Stay (HOSPITAL_COMMUNITY)
Admission: EM | Admit: 2016-11-18 | Discharge: 2016-11-20 | Disposition: A | Discharge status: Home / Self Care | Payer: PPO | Attending: Family Medicine | Admitting: Family Medicine

## 2016-11-18 DIAGNOSIS — W1812XA Fall from or off toilet with subsequent striking against object, initial encounter: Secondary | ICD-10-CM | POA: Insufficient documentation

## 2016-11-18 DIAGNOSIS — Z79899 Other long term (current) drug therapy: Secondary | ICD-10-CM | POA: Insufficient documentation

## 2016-11-18 DIAGNOSIS — Z7982 Long term (current) use of aspirin: Secondary | ICD-10-CM | POA: Insufficient documentation

## 2016-11-18 DIAGNOSIS — Y998 Other external cause status: Secondary | ICD-10-CM | POA: Insufficient documentation

## 2016-11-18 DIAGNOSIS — R531 Weakness: Principal | ICD-10-CM | POA: Insufficient documentation

## 2016-11-18 DIAGNOSIS — T796XXA Traumatic ischemia of muscle, initial encounter: Secondary | ICD-10-CM

## 2016-11-18 DIAGNOSIS — I10 Essential (primary) hypertension: Secondary | ICD-10-CM | POA: Insufficient documentation

## 2016-11-18 DIAGNOSIS — W19XXXA Unspecified fall, initial encounter: Secondary | ICD-10-CM

## 2016-11-18 DIAGNOSIS — M6282 Rhabdomyolysis: Secondary | ICD-10-CM | POA: Diagnosis present

## 2016-11-18 DIAGNOSIS — Z8673 Personal history of transient ischemic attack (TIA), and cerebral infarction without residual deficits: Secondary | ICD-10-CM | POA: Diagnosis not present

## 2016-11-18 DIAGNOSIS — R778 Other specified abnormalities of plasma proteins: Secondary | ICD-10-CM | POA: Diagnosis present

## 2016-11-18 DIAGNOSIS — R7989 Other specified abnormal findings of blood chemistry: Secondary | ICD-10-CM

## 2016-11-18 DIAGNOSIS — S61412A Laceration without foreign body of left hand, initial encounter: Secondary | ICD-10-CM

## 2016-11-18 DIAGNOSIS — J449 Chronic obstructive pulmonary disease, unspecified: Secondary | ICD-10-CM | POA: Diagnosis not present

## 2016-11-18 DIAGNOSIS — Y939 Activity, unspecified: Secondary | ICD-10-CM | POA: Diagnosis not present

## 2016-11-18 DIAGNOSIS — M12812 Other specific arthropathies, not elsewhere classified, left shoulder: Secondary | ICD-10-CM | POA: Diagnosis present

## 2016-11-18 DIAGNOSIS — E039 Hypothyroidism, unspecified: Secondary | ICD-10-CM | POA: Insufficient documentation

## 2016-11-18 DIAGNOSIS — M25552 Pain in left hip: Secondary | ICD-10-CM | POA: Diagnosis not present

## 2016-11-18 DIAGNOSIS — Y92002 Bathroom of unspecified non-institutional (private) residence single-family (private) house as the place of occurrence of the external cause: Secondary | ICD-10-CM | POA: Insufficient documentation

## 2016-11-18 DIAGNOSIS — S0990XA Unspecified injury of head, initial encounter: Secondary | ICD-10-CM | POA: Diagnosis not present

## 2016-11-18 DIAGNOSIS — F17210 Nicotine dependence, cigarettes, uncomplicated: Secondary | ICD-10-CM | POA: Diagnosis not present

## 2016-11-18 DIAGNOSIS — S79912A Unspecified injury of left hip, initial encounter: Secondary | ICD-10-CM | POA: Diagnosis not present

## 2016-11-18 DIAGNOSIS — S4992XA Unspecified injury of left shoulder and upper arm, initial encounter: Secondary | ICD-10-CM | POA: Diagnosis not present

## 2016-11-18 DIAGNOSIS — S299XXA Unspecified injury of thorax, initial encounter: Secondary | ICD-10-CM | POA: Diagnosis not present

## 2016-11-18 DIAGNOSIS — M25512 Pain in left shoulder: Secondary | ICD-10-CM | POA: Diagnosis not present

## 2016-11-18 LAB — CBC WITH DIFFERENTIAL/PLATELET
BASOS ABS: 0 10*3/uL (ref 0.0–0.1)
BASOS PCT: 0 %
EOS ABS: 0 10*3/uL (ref 0.0–0.7)
Eosinophils Relative: 0 %
HCT: 29.5 % — ABNORMAL LOW (ref 36.0–46.0)
HEMOGLOBIN: 10.1 g/dL — AB (ref 12.0–15.0)
Lymphocytes Relative: 9 %
Lymphs Abs: 0.8 10*3/uL (ref 0.7–4.0)
MCH: 31.9 pg (ref 26.0–34.0)
MCHC: 34.2 g/dL (ref 30.0–36.0)
MCV: 93.1 fL (ref 78.0–100.0)
MONOS PCT: 8 %
Monocytes Absolute: 0.8 10*3/uL (ref 0.1–1.0)
NEUTROS ABS: 7.9 10*3/uL — AB (ref 1.7–7.7)
NEUTROS PCT: 83 %
Platelets: 265 10*3/uL (ref 150–400)
RBC: 3.17 MIL/uL — ABNORMAL LOW (ref 3.87–5.11)
RDW: 13.2 % (ref 11.5–15.5)
WBC: 9.4 10*3/uL (ref 4.0–10.5)

## 2016-11-18 LAB — COMPREHENSIVE METABOLIC PANEL
ALK PHOS: 44 U/L (ref 38–126)
ALT: 25 U/L (ref 14–54)
ANION GAP: 9 (ref 5–15)
AST: 56 U/L — ABNORMAL HIGH (ref 15–41)
Albumin: 3.6 g/dL (ref 3.5–5.0)
BUN: 26 mg/dL — ABNORMAL HIGH (ref 6–20)
CALCIUM: 8.4 mg/dL — AB (ref 8.9–10.3)
CO2: 31 mmol/L (ref 22–32)
CREATININE: 0.62 mg/dL (ref 0.44–1.00)
Chloride: 93 mmol/L — ABNORMAL LOW (ref 101–111)
GFR calc non Af Amer: >60 mL/min (ref >60)
Glucose, Bld: 148 mg/dL — ABNORMAL HIGH (ref 65–99)
Potassium: 4.1 mmol/L (ref 3.5–5.1)
Sodium: 133 mmol/L — ABNORMAL LOW (ref 135–145)
TOTAL PROTEIN: 6.2 g/dL — AB (ref 6.5–8.1)
Total Bilirubin: 0.8 mg/dL (ref 0.3–1.2)

## 2016-11-18 LAB — URINALYSIS, ROUTINE W REFLEX MICROSCOPIC
BILIRUBIN URINE: NEGATIVE
Glucose, UA: NEGATIVE mg/dL
Hgb urine dipstick: NEGATIVE
KETONES UR: NEGATIVE mg/dL
LEUKOCYTES UA: NEGATIVE
NITRITE: NEGATIVE
Protein, ur: NEGATIVE mg/dL
SPECIFIC GRAVITY, URINE: 1.016 (ref 1.005–1.030)
pH: 5 (ref 5.0–8.0)

## 2016-11-18 LAB — CK: CK TOTAL: 1063 U/L — AB (ref 38–234)

## 2016-11-18 LAB — TROPONIN I: TROPONIN I: 0.06 ng/mL — AB (ref <0.03)

## 2016-11-18 MED ORDER — SODIUM CHLORIDE 0.9 % IV BOLUS (SEPSIS)
500.0000 mL | Freq: Once | INTRAVENOUS | Status: AC
  Administered 2016-11-19: 500 mL via INTRAVENOUS

## 2016-11-18 MED ORDER — SODIUM CHLORIDE 0.9 % IV SOLN: INTRAVENOUS | Status: DC

## 2016-11-18 NOTE — ED Notes (Signed)
Patient transported to CT 

## 2016-11-18 NOTE — ED Notes (Signed)
Pt ambulatory to rest room with minor assistance. Pt did need assistance getting up from the toilet.

## 2016-11-18 NOTE — ED Triage Notes (Signed)
Patient fell today while going to the bathroom. Per patient was going to sit on the commode when she fell over head first onto floor. Denies LOC, nausea, vomiting, or dizziness. Patient takes 325mg  of aspirin daily but no other blood thinners. Patient reports hematoma to left forehead and skin tear to left hand. Left hand dressed with gauze. Dressing clean, dry, and intact.

## 2016-11-18 NOTE — ED Provider Notes (Signed)
[facility] DEPT Provider Note   CSN: [id number] Arrival date & time: 11/18/16  1658     History   Chief Complaint Chief Complaint  Patient presents with  . Fall    HPI [name] is a 81 y.o. female with past medical history as outlined below, most significant for COPD, hypertension, peripheral vascular disease, history of stroke and arthritis presenting with increased weakness with a fall occurring prior to arrival.  She is currently 3 days out from a left rotator cuff surgery, went home 2 days agoand has had increased weakness, decreased oral intake and confusion noticed by family members.  Her son and daughter-in-law live next door to her, and discovered this morning that she had spent all last night sitting on her toilet as she was too weak to stand up.  She has a life alert button but forgot to push it for assistance.  This afternoon as she was trying to get off the toilet she fell landing on her left side including her left surgery shoulder.  She reports increased fatigue, but denies  focal weakness.she is sustained a hematoma to her for head in the fall, denies LOC.  Also denies chest pain, shortness of breath abdominal pain, nausea, vomiting or dysuria.Family endorses her last pain pill from her surgery was taken last night.  HPI  Past Medical History:  Diagnosis Date  . Anxiety   . Arthritis   . Constipation due to pain medication   . COPD (chronic obstructive pulmonary disease) ([place])   . Depression   . Diverticulosis   . Fibromyalgia    Patient does think so,  [facility] dr said I do  . Gastritis   . GERD (gastroesophageal reflux disease)   . Head injury   . Hypertension   . Hypothyroid   . Hypothyroidism   . Osteoarthritis   . Peripheral vascular disease ([place])    Bilateral subclavian artery disease (status post angioplasty). Also status post left CEA. Status post celiac artery PTA.  Marland Kitchen Pneumonia   . PONV (postoperative nausea and vomiting)    severe n/v after  every surgery- DId not get sick after surgery 07/2015  . Shortness of breath dyspnea   . Stroke [facility]) 02/2013   Archie Endo 03/12/2013; memory - at times  . Urgency incontinence     Patient Active Problem List   Diagnosis Date Noted  . DJD of left shoulder 11/15/2016  . Preop cardiovascular exam 11/11/2016  . DOE (dyspnea on exertion) 11/11/2016  . Rotator cuff arthropathy of left shoulder 10/30/2016  . Intertrochanteric fracture of right hip ([place]) 08/01/2015    Class: Acute  . Intertrochanteric fracture of right femur ([place]) 08/01/2015  . Hip fracture ([place]) 07/31/2015  . Abdominal pain 09/16/2014  . SMA stenosis ([place]) 09/15/2014  . Abdominal pain, right upper quadrant 09/15/2014  . Essential hypertension 09/15/2014  . Depression 09/15/2014  . Hypothyroidism 09/15/2014  . GERD without esophagitis 09/15/2014  . Lumbago 11/12/2013  . Stiffness of joint, not elsewhere classified, pelvic region and thigh 11/12/2013  . Muscle weakness (generalized) 11/12/2013  . Lack of coordination 03/21/2013  . Fine motor impairment 03/21/2013  . Left leg weakness 03/17/2013  . Risk for falls 03/17/2013  . Carotid artery disease ([place]) 03/11/2013  . Subclavian artery stenosis ([place]) 03/11/2013  . TIA (transient ischemic attack) 03/11/2013  . Tobacco use disorder 03/11/2013  . CVA (cerebral infarction) 03/05/2013  . Pain in joint, shoulder region 11/01/2010    Past Surgical History:  Procedure Laterality  Date  . ANTERIOR AND POSTERIOR REPAIR  12/2000   Archie Endo 07/04/2010  . ANTERIOR CERVICAL DECOMP/DISCECTOMY FUSION N/A 04/03/2012   Procedure: ANTERIOR CERVICAL DECOMPRESSION/DISCECTOMY FUSION 2 LEVELS;  Surgeon: Hosie Spangle, MD;  Location: [facility] NEURO ORS;  Service: Neurosurgery;  Laterality: N/A;  Cervical four-five,Cervical five-six anterior cervical decompression with fusion plating and bonegraft  . BACK SURGERY    . BREAST SURGERY Right 02/2007   Archie Endo 06/23/2010  . BUNIONECTOMY WITH HAMMERTOE  RECONSTRUCTION Left 09/10/2007   Archie Endo 06/23/2010  . CAROTID STENT Left   . CATARACT EXTRACTION W/ INTRAOCULAR LENS IMPLANT Left    Archie Endo 06/23/2010  . CHOLECYSTECTOMY    . COLONOSCOPY N/A 07/15/2014   Procedure: COLONOSCOPY;  Surgeon: Rogene Houston, MD;  Location: [facility];  Service: Endoscopy;  Laterality: N/A;  200 - moved to 2:10 - Ann to notify pt  . COMPRESSION HIP SCREW Right 08/01/2015   Procedure: COMPRESSION HIP;  Surgeon: Carole Civil, MD;  Location: [facility] ORS;  Service: Orthopedics;  Laterality: Right;  . ESOPHAGOGASTRODUODENOSCOPY N/A 07/15/2014   Procedure: ESOPHAGOGASTRODUODENOSCOPY (EGD);  Surgeon: Rogene Houston, MD;  Location: [facility] ENDO [facility];  Service: Endoscopy;  Laterality: N/A;  . EYE SURGERY     cataract /lens both eyes  . FRACTURE SURGERY    . INCONTINENCE SURGERY  12/2000   Tension-free transvaginal tape procedure/notes 07/04/2010  . JOINT REPLACEMENT    . LUMBAR FUSION  08/2006   Archie Endo 06/23/2010  . PERIPHERAL VASCULAR CATHETERIZATION N/A 10/08/2014   Procedure: Visceral Angiography;  Surgeon: Algernon Huxley, MD;  Location: [facility] CV LAB;  Service: Cardiovascular;  - long stenosis/occlusion of SMA. 75% celiac artery (PTA with 6 mm balloon)  . PERIPHERAL VASCULAR CATHETERIZATION N/A 10/08/2014   Procedure: Visceral Artery Intervention;  Surgeon: Algernon Huxley, MD;  Location: [facility] CV LAB;  Service: Cardiovascular;  Laterality: N/A;  . ROTATOR CUFF REPAIR Left X 3  . TOTAL KNEE ARTHROPLASTY Bilateral 2005; 2011   right; left  . TOTAL SHOULDER ARTHROPLASTY Left 11/15/2016  . TOTAL SHOULDER ARTHROPLASTY Left 11/15/2016   Procedure: LEFT TOTAL SHOULDER ARTHROPLASTY;  Surgeon: [name], MD;  Location: [facility];  Service: Orthopedics;  Laterality: Left;  Marland Kitchen VAGINAL HYSTERECTOMY  12/2000   Archie Endo 07/04/2010  . VASCULAR SURGERY      OB History    No data available       Home Medications    Prior to Admission medications   Medication Sig Start  Date End Date Taking? Authorizing Provider  Ascorbic Acid (VITAMIN C) 1000 MG tablet Take 1,000 mg by mouth 2 (two) times daily.    Yes [provider]  aspirin EC 325 MG EC tablet Take 1 tablet (325 mg total) by mouth daily with breakfast. 08/04/15  Yes Rosita Fire, MD  aspirin EC 81 MG tablet Take 81 mg by mouth every other day.   Yes [provider]  Calcium 200 MG TABS Take 400 mg by mouth daily.   Yes [provider]  CHANTIX 1 MG tablet TAKE 1 TABLET BY MOUTH ONCE DAILY AS NEEDED FOR SMOKING 08/07/16  Yes [provider]  diltiazem (DILACOR XR) 120 MG 24 hr capsule Take 120 mg by mouth every evening.   Yes [provider]  diphenhydrAMINE (SOMINEX) 25 MG tablet Take 50 mg by mouth at bedtime.    Yes [provider]  DULoxetine (CYMBALTA) 60 MG capsule Take 60 mg by mouth daily.   Yes [provider]  HYDROmorphone (DILAUDID) 2 MG tablet Take 1-2 tabs po q4-6 hours prn pain Patient taking differently: Take 2-4 mg by mouth every 6 (six) hours as needed for moderate pain or severe pain. Take 1-2 tabs po q4-6 hours prn pain 11/15/16  Yes Aundra Dubin, PA-C  levothyroxine (SYNTHROID, LEVOTHROID) 88 MCG tablet Take 1 tablet (88 mcg total) by mouth daily. Patient taking differently: Take 88 mcg by mouth daily before breakfast.  03/12/13  Yes Love, Ivan Anchors, PA-C  mirabegron ER (MYRBETRIQ) 50 MG TB24 tablet Take 50 mg by mouth daily.   Yes [provider]  Multiple Vitamin (MULTIVITAMIN WITH MINERALS) TABS Take 1 tablet by mouth daily.   Yes [provider]  niacin 500 MG tablet Take 500 mg by mouth at bedtime.   Yes [provider]  ondansetron (ZOFRAN) 4 MG tablet Take 1 tablet (4 mg total) by mouth every 8 (eight) hours as needed for nausea or vomiting. 11/15/16  Yes Aundra Dubin, PA-C    Family History Family History  Problem Relation Age of Onset  . Pancreatic cancer Mother   . Prostate cancer  Father     Social History Social History  Substance Use Topics  . Smoking status: Light Tobacco Smoker    Packs/day: 0.12    Years: 63.00    Types: Cigarettes  . Smokeless tobacco: Never Used  . Alcohol use No     Allergies   Aleve [naproxen sodium]; Hydrocodone; Statins; Gluten meal; Wheat bran; Celebrex [celecoxib]; Codeine; and Morphine and related   Review of Systems Review of Systems  Constitutional: Positive for activity change and appetite change. Negative for chills and fever.  HENT: Negative for congestion and sore throat.   Eyes: Negative.   Respiratory: Negative for chest tightness and shortness of breath.   Cardiovascular: Negative for chest pain.  Gastrointestinal: Negative for abdominal pain, nausea and vomiting.  Genitourinary: Negative.   Musculoskeletal: Positive for arthralgias. Negative for joint swelling and neck pain.  Skin: Positive for wound. Negative for rash.  Neurological: Positive for weakness. Negative for dizziness, light-headedness, numbness and headaches.  Psychiatric/Behavioral: Negative.      Physical Exam Updated Vital Signs BP (!) 131/54 (BP Location: Right Arm)   Pulse (!) 102   Temp 98.7 F (37.1 C) (Oral)   Resp 18   Ht 5' 1.5" (1.562 m)   Wt 54.4 kg (120 lb)   SpO2 92%   BMI 22.31 kg/m   Physical Exam  Constitutional: She appears well-developed and well-nourished.  HENT:  Head: Normocephalic. Head is with contusion.  Hematoma left forehead.  Eyes: Conjunctivae are normal.  Neck: Normal range of motion.  Cardiovascular: Normal rate, regular rhythm, normal heart sounds and intact distal pulses.   Pulmonary/Chest: Effort normal and breath sounds normal. She has no wheezes.  Abdominal: Soft. Bowel sounds are normal. There is no tenderness.  Musculoskeletal: Normal range of motion.  Neurological: She is alert.  Skin: Skin is warm and dry.  Psychiatric: She has a normal mood and affect.  Nursing note and vitals  reviewed.    ED Treatments / Results  Labs (all labs ordered are listed, but only abnormal results are displayed) Labs Reviewed  COMPREHENSIVE METABOLIC PANEL - Abnormal; Notable for the following:       Result Value   Sodium 133 (*)    Chloride 93 (*)    Glucose, Bld 148 (*)    BUN 26 (*)    Calcium 8.4 (*)  Total Protein 6.2 (*)    AST 56 (*)    All other components within normal limits  CBC WITH DIFFERENTIAL/PLATELET - Abnormal; Notable for the following:    RBC 3.17 (*)    Hemoglobin 10.1 (*)    HCT 29.5 (*)    Neutro Abs 7.9 (*)    All other components within normal limits  TROPONIN I - Abnormal; Notable for the following:    Troponin I 0.06 (*)    All other components within normal limits  CK - Abnormal; Notable for the following:    Total CK 1,063 (*)    All other components within normal limits  URINALYSIS, ROUTINE W REFLEX MICROSCOPIC    EKG  EKG Interpretation  Date/Time:  Saturday November 18 2016 22:33:14 EDT Ventricular Rate:  106 PR Interval:    QRS Duration: 86 QT Interval:  333 QTC Calculation: 443 R Axis:   71 Text Interpretation:  Sinus tachycardia Ventricular trigeminy Probable left atrial enlargement Borderline low voltage, extremity leads Baseline wander When compared with ECG of 11/06/2016 Premature ventricular complexes are now Present Otherwise no significant change Confirmed by Francine Graven [phone]) on 11/18/2016 10:40:51 PM       Radiology Dg Chest 1 View  Result Date: 11/18/2016 CLINICAL DATA:  [place] onto the bathroom floor today. EXAM: CHEST 1 VIEW COMPARISON:  Left shoulder radiographs obtained today. Chest and right rib radiographs dated 07/18/2016. FINDINGS: Normal sized heart. Aortic calcifications. Clear lungs. Old, healed right fifth lateral rib fracture. The previously demonstrated acute right posterior fifth rib fracture is not currently visualized. Probable callus formation associated with an old left posterior tenth rib  fracture. Superior migration of the right humeral head with extensive spur formation. IMPRESSION: 1. No acute abnormality. 2. Marked right glenohumeral joint degenerative changes with evidence of a large, chronic right rotator cuff tear. Electronically Signed   By: Claudie Revering M.D.   On: 11/18/2016 19:24   Ct Head Wo Contrast  Result Date: 11/18/2016 CLINICAL DATA:  Forehead hematoma after falling and hitting her head on the bathroom floor today. EXAM: CT HEAD WITHOUT CONTRAST TECHNIQUE: Contiguous axial images were obtained from the base of the skull through the vertex without intravenous contrast. COMPARISON:  02/28/2013. FINDINGS: Brain: Diffusely enlarged ventricles and subarachnoid spaces. Patchy white matter low density in both cerebral hemispheres. No intracranial hemorrhage, mass lesion or CT evidence of acute infarction. Vascular: No hyperdense vessel or unexpected calcification. Skull: Normal. Negative for fracture or focal lesion. Sinuses/Orbits: Status post bilateral cataract extraction. A small metallic structure is again demonstrated at the superior medial aspect of the left globe. Normally pneumatized paranasal sinuses. Other: None. IMPRESSION: 1. No acute abnormality. 2. Mildly progressive diffuse cerebral and cerebellar atrophy. 3. Marked diffuse chronic small vessel white matter ischemic changes in both cerebral hemispheres and in the cerebellum, with progression. 4. Stable small metallic foreign body in the superior medial margin of the left globe. This could represent a surgically placed object or a foreign body. This would preclude MRI unless this can be proven MR safe. Electronically Signed   By: Claudie Revering M.D.   On: 11/18/2016 19:17   Dg Shoulder Left  Result Date: 11/18/2016 CLINICAL DATA:  Left shoulder pain following a fall onto the bathroom floor today. EXAM: LEFT SHOULDER - 2+ VIEW COMPARISON:  11/15/2016. FINDINGS: Stable left shoulder prosthesis. No fracture or dislocation  seen. Cervical spine fixation hardware. IMPRESSION: No fracture or dislocation. Electronically Signed   By: Percell Locus.D.  On: 11/18/2016 19:18   Dg Hip Unilat With Pelvis 2-3 Views Left  Result Date: 11/18/2016 CLINICAL DATA:  Left hip pain following a fall onto the bathroom floor today. EXAM: DG HIP (WITH OR WITHOUT PELVIS) 2-3V LEFT COMPARISON:  None. FINDINGS: Diffuse osteopenia. Hardware fixation of the proximal right femur. No fracture or dislocation seen. Lower lumbar spine degenerative changes. Atheromatous arterial calcifications. IMPRESSION: No fracture or dislocation. Electronically Signed   By: Claudie Revering M.D.   On: 11/18/2016 19:19    Procedures Procedures (including critical care time)  Medications Ordered in ED Medications  sodium chloride 0.9 % bolus 500 mL (not administered)  0.9 %  sodium chloride infusion (not administered)     Initial Impression / Assessment and Plan / ED Course  I have reviewed the triage vital signs and the nursing notes.  Pertinent labs & imaging results that were available during my care of the patient were reviewed by me and considered in my medical decision making (see chart for details).     Pt with fatigue and weakness, worsened since rotator surgery this week, with fall and elevated CK, but also with elevation in troponin.  Pt will need admission for supportive care and cardiac rule out.  She also may benefit by rehab for strengthening and reconditioning prior to returning to her home.   Spoke with Dr. Shanon Brow with [facility] - requests Cardiology consult prior to admission.  12:10 AM Spoke with Dr. Eula Fried of cardiology - pt can stay at [facility] - advised aspirin, serial troponins, consider statin but would hold in the presence of rhabdo.  Final Clinical Impressions(s) / ED Diagnoses   Final diagnoses:  Weakness  Traumatic rhabdomyolysis, initial encounter [facility])  Skin tear of left hand without complication, initial encounter     New Prescriptions New Prescriptions   No medications on file     Landis Martins 11/19/16 0024    Francine Graven, DO 11/22/16 1149

## 2016-11-18 NOTE — ED Notes (Signed)
EDP talking with pt and family.

## 2016-11-19 ENCOUNTER — Observation Stay (HOSPITAL_COMMUNITY): Payer: PPO

## 2016-11-19 ENCOUNTER — Encounter (HOSPITAL_COMMUNITY): Payer: Self-pay | Admitting: *Deleted

## 2016-11-19 DIAGNOSIS — R748 Abnormal levels of other serum enzymes: Secondary | ICD-10-CM | POA: Diagnosis not present

## 2016-11-19 DIAGNOSIS — R531 Weakness: Secondary | ICD-10-CM

## 2016-11-19 DIAGNOSIS — I10 Essential (primary) hypertension: Secondary | ICD-10-CM | POA: Diagnosis not present

## 2016-11-19 DIAGNOSIS — R778 Other specified abnormalities of plasma proteins: Secondary | ICD-10-CM | POA: Diagnosis present

## 2016-11-19 DIAGNOSIS — M6282 Rhabdomyolysis: Secondary | ICD-10-CM | POA: Diagnosis not present

## 2016-11-19 DIAGNOSIS — R7989 Other specified abnormal findings of blood chemistry: Secondary | ICD-10-CM

## 2016-11-19 LAB — CK
CK TOTAL: 536 U/L — AB (ref 38–234)
Total CK: 824 U/L — ABNORMAL HIGH (ref 38–234)

## 2016-11-19 LAB — TROPONIN I
TROPONIN I: 0.05 ng/mL — AB (ref <0.03)
TROPONIN I: 0.03 ng/mL — AB (ref <0.03)
Troponin I: 0.05 ng/mL (ref <0.03)

## 2016-11-19 LAB — CBC
HCT: 27.7 % — ABNORMAL LOW (ref 36.0–46.0)
Hemoglobin: 9.5 g/dL — ABNORMAL LOW (ref 12.0–15.0)
MCH: 31.6 pg (ref 26.0–34.0)
MCHC: 34.3 g/dL (ref 30.0–36.0)
MCV: 92 fL (ref 78.0–100.0)
Platelets: 254 10*3/uL (ref 150–400)
RBC: 3.01 MIL/uL — AB (ref 3.87–5.11)
RDW: 13.2 % (ref 11.5–15.5)
WBC: 7.4 10*3/uL (ref 4.0–10.5)

## 2016-11-19 LAB — BASIC METABOLIC PANEL
Anion gap: 8 (ref 5–15)
BUN: 17 mg/dL (ref 6–20)
CALCIUM: 7.6 mg/dL — AB (ref 8.9–10.3)
CO2: 27 mmol/L (ref 22–32)
CREATININE: 0.45 mg/dL (ref 0.44–1.00)
Chloride: 96 mmol/L — ABNORMAL LOW (ref 101–111)
GFR calc non Af Amer: >60 mL/min (ref >60)
Glucose, Bld: 86 mg/dL (ref 65–99)
Potassium: 3.7 mmol/L (ref 3.5–5.1)
SODIUM: 131 mmol/L — AB (ref 135–145)

## 2016-11-19 LAB — MAGNESIUM: MAGNESIUM: 1.6 mg/dL — AB (ref 1.7–2.4)

## 2016-11-19 MED ORDER — ASPIRIN 81 MG PO CHEW
324.0000 mg | CHEWABLE_TABLET | Freq: Once | ORAL | Status: AC
  Administered 2016-11-19: 324 mg via ORAL
  Filled 2016-11-19: qty 4

## 2016-11-19 MED ORDER — DULOXETINE HCL 60 MG PO CPEP
60.0000 mg | ORAL_CAPSULE | Freq: Every day | ORAL | Status: DC
  Administered 2016-11-19 – 2016-11-20 (×2): 60 mg via ORAL
  Filled 2016-11-19 (×2): qty 1

## 2016-11-19 MED ORDER — DILTIAZEM HCL ER COATED BEADS 120 MG PO CP24
120.0000 mg | ORAL_CAPSULE | Freq: Every evening | ORAL | Status: DC
Start: 2016-11-19 — End: 2016-11-20
  Administered 2016-11-19 – 2016-11-20 (×2): 120 mg via ORAL
  Filled 2016-11-19 (×3): qty 1

## 2016-11-19 MED ORDER — MAGNESIUM SULFATE 2 GM/50ML IV SOLN
2.0000 g | Freq: Once | INTRAVENOUS | Status: AC
  Administered 2016-11-19: 2 g via INTRAVENOUS
  Filled 2016-11-19: qty 50

## 2016-11-19 MED ORDER — HYDROMORPHONE HCL 2 MG PO TABS: 2.0000 mg | ORAL_TABLET | Freq: Four times a day (QID) | ORAL | Status: DC | PRN

## 2016-11-19 MED ORDER — SODIUM CHLORIDE 0.9 % IV SOLN
INTRAVENOUS | Status: DC
  Administered 2016-11-19 – 2016-11-20 (×3): via INTRAVENOUS

## 2016-11-19 MED ORDER — HYDROMORPHONE HCL 2 MG PO TABS
2.0000 mg | ORAL_TABLET | Freq: Four times a day (QID) | ORAL | Status: DC | PRN
  Administered 2016-11-20: 2 mg via ORAL
  Filled 2016-11-19: qty 1

## 2016-11-19 MED ORDER — SODIUM CHLORIDE 0.9 % IV BOLUS (SEPSIS): 1000.0000 mL | Freq: Once | INTRAVENOUS | Status: AC

## 2016-11-19 MED ORDER — ASPIRIN EC 325 MG PO TBEC
325.0000 mg | DELAYED_RELEASE_TABLET | Freq: Every day | ORAL | Status: DC
  Administered 2016-11-19 – 2016-11-20 (×2): 325 mg via ORAL
  Filled 2016-11-19 (×2): qty 1

## 2016-11-19 MED ORDER — BISACODYL 5 MG PO TBEC
5.0000 mg | DELAYED_RELEASE_TABLET | Freq: Every day | ORAL | Status: DC
  Administered 2016-11-19: 5 mg via ORAL
  Filled 2016-11-19 (×2): qty 1

## 2016-11-19 MED ORDER — POLYETHYLENE GLYCOL 3350 17 G PO PACK: 17.0000 g | PACK | Freq: Every day | ORAL | Status: DC

## 2016-11-19 MED ORDER — ONDANSETRON HCL 4 MG PO TABS: 4.0000 mg | ORAL_TABLET | Freq: Three times a day (TID) | ORAL | Status: DC | PRN

## 2016-11-19 MED ORDER — LEVOTHYROXINE SODIUM 88 MCG PO TABS
88.0000 ug | ORAL_TABLET | Freq: Every day | ORAL | Status: DC
  Administered 2016-11-19 – 2016-11-20 (×2): 88 ug via ORAL
  Filled 2016-11-19 (×2): qty 1

## 2016-11-19 MED ORDER — POLYETHYLENE GLYCOL 3350 17 G PO PACK
17.0000 g | PACK | Freq: Two times a day (BID) | ORAL | Status: DC
  Administered 2016-11-19: 17 g via ORAL
  Filled 2016-11-19: qty 1

## 2016-11-19 NOTE — ED Notes (Signed)
Report given to Bree RN on 300 

## 2016-11-19 NOTE — Progress Notes (Signed)
11/18/2016  5:33 PM  11/19/2016 11:08 AM  [name] was seen and examined.  The H&P by the admitting provider, orders, imaging was reviewed.  Please see new orders.  Will continue to follow.   Murvin Natal, MD [facility]

## 2016-11-19 NOTE — H&P (Signed)
History and Physical    [name]:[id number] DOB: [birth year] DOA: 11/18/2016  PCP: Sinda Du, MD  Patient coming from:   home  Chief Complaint:   weakness  HPI: [name] is a 81 y.o. female with medical history significant of recent left rotator cuff surgery on Thursday (today is sat) was sent home and lives alone.  She says she has not been eating or drinking much and has been weak.  Denies any fevers.  No n/v/d.  She says she went to the bathroom last night and did not have the strength to get up from the toilet and fell.  Her family found her hours later.  She denies any LOC.  She has been offered rehab and does not want that even though her family wants her too.  She denies any chest pain or sob, no cough.  Pt referred for admission for weakness, elev trop and cpk.   Review of Systems: As per HPI otherwise 10 point review of systems negative.   Past Medical History:  Diagnosis Date  . Anxiety   . Arthritis   . Constipation due to pain medication   . COPD (chronic obstructive pulmonary disease) ([place])   . Depression   . Diverticulosis   . Fibromyalgia    Patient does think so,  [facility] dr said I do  . Gastritis   . GERD (gastroesophageal reflux disease)   . Head injury   . Hypertension   . Hypothyroid   . Hypothyroidism   . Osteoarthritis   . Peripheral vascular disease ([place])    Bilateral subclavian artery disease (status post angioplasty). Also status post left CEA. Status post celiac artery PTA.  Marland Kitchen Pneumonia   . PONV (postoperative nausea and vomiting)    severe n/v after every surgery- DId not get sick after surgery 07/2015  . Shortness of breath dyspnea   . Stroke [facility]) 02/2013   Archie Endo 03/12/2013; memory - at times  . Urgency incontinence     Past Surgical History:  Procedure Laterality Date  . ANTERIOR AND POSTERIOR REPAIR  12/2000   Archie Endo 07/04/2010  . ANTERIOR CERVICAL DECOMP/DISCECTOMY FUSION N/A 04/03/2012   Procedure: ANTERIOR CERVICAL  DECOMPRESSION/DISCECTOMY FUSION 2 LEVELS;  Surgeon: Hosie Spangle, MD;  Location: [place] [facility] ORS;  Service: Neurosurgery;  Laterality: N/A;  Cervical four-five,Cervical five-six anterior cervical decompression with fusion plating and bonegraft  . BACK SURGERY    . BREAST SURGERY Right 02/2007   Archie Endo 06/23/2010  . BUNIONECTOMY WITH HAMMERTOE RECONSTRUCTION Left 09/10/2007   Archie Endo 06/23/2010  . CAROTID STENT Left   . CATARACT EXTRACTION W/ INTRAOCULAR LENS IMPLANT Left    Archie Endo 06/23/2010  . CHOLECYSTECTOMY    . COLONOSCOPY N/A 07/15/2014   Procedure: COLONOSCOPY;  Surgeon: Rogene Houston, MD;  [facility]: [facility];  Service: Endoscopy;  Laterality: N/A;  200 - moved to 2:10 - Ann to notify pt  . COMPRESSION HIP SCREW Right 08/01/2015   Procedure: COMPRESSION HIP;  Surgeon: Carole Civil, MD;  Location: [facility];  Service: Orthopedics;  Laterality: Right;  . ESOPHAGOGASTRODUODENOSCOPY N/A 07/15/2014   Procedure: ESOPHAGOGASTRODUODENOSCOPY (EGD);  Surgeon: Rogene Houston, MD;  [facility];  Service: Endoscopy;  Laterality: N/A;  . EYE SURGERY     cataract /lens both eyes  . FRACTURE SURGERY    . INCONTINENCE SURGERY  12/2000   Tension-free transvaginal tape procedure/notes 07/04/2010  . JOINT REPLACEMENT    . LUMBAR FUSION  08/2006   /  notes 06/23/2010  . PERIPHERAL VASCULAR CATHETERIZATION N/A 10/08/2014   Procedure: Visceral Angiography;  Surgeon: Algernon Huxley, MD;  Location: [place] CV LAB;  Service: Cardiovascular;  - long stenosis/occlusion of SMA. 75% celiac artery (PTA with 6 mm balloon)  . PERIPHERAL VASCULAR CATHETERIZATION N/A 10/08/2014   Procedure: Visceral Artery Intervention;  Surgeon: Algernon Huxley, MD;  Location: [place] CV LAB;  Service: Cardiovascular;  Laterality: N/A;  . ROTATOR CUFF REPAIR Left X 3  . TOTAL KNEE ARTHROPLASTY Bilateral 2005; 2011   right; left  . TOTAL SHOULDER ARTHROPLASTY Left 11/15/2016  . TOTAL SHOULDER ARTHROPLASTY Left  11/15/2016   Procedure: LEFT TOTAL SHOULDER ARTHROPLASTY;  Surgeon: [name], MD;  Location: [place];  Service: Orthopedics;  Laterality: Left;  Marland Kitchen VAGINAL HYSTERECTOMY  12/2000   Archie Endo 07/04/2010  . VASCULAR SURGERY       reports that she has been smoking Cigarettes.  She has a 7.56 pack-year smoking history. She has never used smokeless tobacco. She reports that she does not drink alcohol or use drugs.  Allergies  Allergen Reactions  . Aleve [Naproxen Sodium] Hives and Palpitations  . Hydrocodone Nausea Only  . Statins Other (See Comments)    Muscle pain  . Gluten Meal   . Wheat Bran     Gluten intolerant  . Celebrex [Celecoxib] Nausea And Vomiting  . Codeine Nausea And Vomiting  . Morphine And Related Nausea And Vomiting    Family History  Problem Relation Age of Onset  . Pancreatic cancer Mother   . Prostate cancer Father     Prior to Admission medications   Medication Sig Start Date End Date Taking? Authorizing Provider  Ascorbic Acid (VITAMIN C) 1000 MG tablet Take 1,000 mg by mouth 2 (two) times daily.    Yes [provider]  aspirin EC 325 MG EC tablet Take 1 tablet (325 mg total) by mouth daily with breakfast. 08/04/15  Yes Rosita Fire, MD  aspirin EC 81 MG tablet Take 81 mg by mouth every other day.   Yes [provider]  Calcium 200 MG TABS Take 400 mg by mouth daily.   Yes [provider]  CHANTIX 1 MG tablet TAKE 1 TABLET BY MOUTH ONCE DAILY AS NEEDED FOR SMOKING 08/07/16  Yes [provider]  diltiazem (DILACOR XR) 120 MG 24 hr capsule Take 120 mg by mouth every evening.   Yes [provider]  diphenhydrAMINE (SOMINEX) 25 MG tablet Take 50 mg by mouth at bedtime.    Yes [provider]  DULoxetine (CYMBALTA) 60 MG capsule Take 60 mg by mouth daily.   Yes [provider]  HYDROmorphone (DILAUDID) 2 MG tablet Take 1-2 tabs po q4-6 hours prn pain Patient taking differently: Take 2-4 mg by mouth  every 6 (six) hours as needed for moderate pain or severe pain. Take 1-2 tabs po q4-6 hours prn pain 11/15/16  Yes Aundra Dubin, PA-C  levothyroxine (SYNTHROID, LEVOTHROID) 88 MCG tablet Take 1 tablet (88 mcg total) by mouth daily. Patient taking differently: Take 88 mcg by mouth daily before breakfast.  03/12/13  Yes Love, Ivan Anchors, PA-C  mirabegron ER (MYRBETRIQ) 50 MG TB24 tablet Take 50 mg by mouth daily.   Yes [provider]  Multiple Vitamin (MULTIVITAMIN WITH MINERALS) TABS Take 1 tablet by mouth daily.   Yes [provider]  niacin 500 MG tablet Take 500 mg by mouth at bedtime.   Yes [provider]  ondansetron (ZOFRAN) 4 MG tablet Take 1 tablet (4 mg total) by mouth every 8 (eight) hours as needed for nausea or vomiting. 11/15/16  Yes Aundra Dubin, PA-C    Physical Exam: Vitals:   11/18/16 1731  BP: (!) 131/54  Pulse: (!) 102  Resp: 18  Temp: 98.7 F (37.1 C)  TempSrc: Oral  SpO2: 92%  Weight: 54.4 kg (120 lb)  Height: 5' 1.5" (1.562 m)      Constitutional: NAD, calm, comfortable Vitals:   11/18/16 1731  BP: (!) 131/54  Pulse: (!) 102  Resp: 18  Temp: 98.7 F (37.1 C)  TempSrc: Oral  SpO2: 92%  Weight: 54.4 kg (120 lb)  Height: 5' 1.5" (1.562 m)   Eyes: PERRL, lids and conjunctivae normal ENMT: Mucous membranes are moist. Posterior pharynx clear of any exudate or lesions.Normal dentition.  Neck: normal, supple, no masses, no thyromegaly Respiratory: clear to auscultation bilaterally, no wheezing, no crackles. Normal respiratory effort. No accessory muscle use.  Cardiovascular: Regular rate and rhythm, no murmurs / rubs / gallops. No extremity edema. 2+ pedal pulses. No carotid bruits.  Abdomen: no tenderness, no masses palpated. No hepatosplenomegaly. Bowel sounds positive.  Musculoskeletal: no clubbing / cyanosis. No joint deformity upper and lower extremities. Good ROM, no contractures. Normal muscle tone.   Left shoulder in  sling Skin: no rashes, lesions, ulcers. No induration Neurologic: CN 2-12 grossly intact. Sensation intact, DTR normal. Strength 5/5 in all 4.  Psychiatric: Normal judgment and insight. Alert and oriented x 3. Normal mood.    Labs on Admission: I have personally reviewed following labs and imaging studies  CBC:  Recent Labs Lab 11/16/16 0556 11/18/16 1809  WBC 9.8 9.4  NEUTROABS  --  7.9*  HGB 10.6* 10.1*  HCT 31.3* 29.5*  MCV 93.4 93.1  PLT 239 630   Basic Metabolic Panel:  Recent Labs Lab 11/18/16 1809  NA 133*  K 4.1  CL 93*  CO2 31  GLUCOSE 148*  BUN 26*  CREATININE 0.62  CALCIUM 8.4*   GFR: Estimated Creatinine Clearance: 44.1 mL/min (by C-G formula based on SCr of 0.62 mg/dL). Liver Function Tests:  Recent Labs Lab 11/18/16 1809  AST 56*  ALT 25  ALKPHOS 44  BILITOT 0.8  PROT 6.2*  ALBUMIN 3.6   Cardiac Enzymes:  Recent Labs Lab 11/18/16 2201  CKTOTAL 1,063*  TROPONINI 0.06*   Urine analysis:    Component Value Date/Time   COLORURINE YELLOW 11/18/2016 2011   APPEARANCEUR CLEAR 11/18/2016 2011   LABSPEC 1.016 11/18/2016 2011   PHURINE 5.0 11/18/2016 2011   GLUCOSEU NEGATIVE 11/18/2016 2011   HGBUR NEGATIVE 11/18/2016 2011   [place] NEGATIVE 11/18/2016 2011   [place] NEGATIVE 11/18/2016 2011   PROTEINUR NEGATIVE 11/18/2016 2011   UROBILINOGEN 0.2 10/22/2014 0019   NITRITE NEGATIVE 11/18/2016 2011   LEUKOCYTESUR NEGATIVE 11/18/2016 2011   Radiological Exams on Admission: Dg Chest 1 View  Result Date: 11/18/2016 CLINICAL DATA:  [place] onto the bathroom floor today. EXAM: CHEST 1 VIEW COMPARISON:  Left shoulder radiographs obtained today. Chest and right rib radiographs dated 07/18/2016. FINDINGS: Normal sized heart. Aortic calcifications. Clear lungs. Old, healed right fifth lateral rib fracture. The previously demonstrated acute right posterior fifth rib fracture is not currently visualized. Probable callus formation associated with  an old left posterior tenth rib fracture. Superior migration of the right humeral head with extensive spur formation. IMPRESSION: 1. No acute abnormality. 2. Marked right glenohumeral joint degenerative changes with evidence of  a large, chronic right rotator cuff tear. Electronically Signed   By: Claudie Revering M.D.   On: 11/18/2016 19:24   Ct Head Wo Contrast  Result Date: 11/18/2016 CLINICAL DATA:  Forehead hematoma after falling and hitting her head on the bathroom floor today. EXAM: CT HEAD WITHOUT CONTRAST TECHNIQUE: Contiguous axial images were obtained from the base of the skull through the vertex without intravenous contrast. COMPARISON:  02/28/2013. FINDINGS: Brain: Diffusely enlarged ventricles and subarachnoid spaces. Patchy white matter low density in both cerebral hemispheres. No intracranial hemorrhage, mass lesion or CT evidence of acute infarction. Vascular: No hyperdense vessel or unexpected calcification. Skull: Normal. Negative for fracture or focal lesion. Sinuses/Orbits: Status post bilateral cataract extraction. A small metallic structure is again demonstrated at the superior medial aspect of the left globe. Normally pneumatized paranasal sinuses. Other: None. IMPRESSION: 1. No acute abnormality. 2. Mildly progressive diffuse cerebral and cerebellar atrophy. 3. Marked diffuse chronic small vessel white matter ischemic changes in both cerebral hemispheres and in the cerebellum, with progression. 4. Stable small metallic foreign body in the superior medial margin of the left globe. This could represent a surgically placed object or a foreign body. This would preclude MRI unless this can be proven MR safe. Electronically Signed   By: Claudie Revering M.D.   On: 11/18/2016 19:17   Dg Shoulder Left  Result Date: 11/18/2016 CLINICAL DATA:  Left shoulder pain following a fall onto the bathroom floor today. EXAM: LEFT SHOULDER - 2+ VIEW COMPARISON:  11/15/2016. FINDINGS: Stable left shoulder  prosthesis. No fracture or dislocation seen. Cervical spine fixation hardware. IMPRESSION: No fracture or dislocation. Electronically Signed   By: Claudie Revering M.D.   On: 11/18/2016 19:18   Dg Hip Unilat With Pelvis 2-3 Views Left  Result Date: 11/18/2016 CLINICAL DATA:  Left hip pain following a fall onto the bathroom floor today. EXAM: DG HIP (WITH OR WITHOUT PELVIS) 2-3V LEFT COMPARISON:  None. FINDINGS: Diffuse osteopenia. Hardware fixation of the proximal right femur. No fracture or dislocation seen. Lower lumbar spine degenerative changes. Atheromatous arterial calcifications. IMPRESSION: No fracture or dislocation. Electronically Signed   By: Claudie Revering M.D.   On: 11/18/2016 19:19    EKG: Independently reviewed. Sinus tach, pvcx no acute changes   Assessment/Plan 81 yo female with weakness, fall, with rhabdomyolisis  Principal Problem:   Weakness-  Ck orthostatics.  Ivf.  No focal neuro deficits.  Offered rehab she declines  Active Problems:   Essential hypertension- clarify home meds   Rotator cuff arthropathy of left shoulder- noted   Rhabdomyolysis- ivf, serial cpk   Elevated troponin- mildly elevated, aspirin, serial trop.  Cardiac echo in am.  [facility] at [facility] called who did not think this was significant   Med rec pending   DVT prophylaxis:  scds Code Status:  full Family Communication:  none Disposition Plan:  Per day team Consults called:  none Admission status:  observation   Mont Jagoda A MD [facility]  If 7PM-7AM, please contact night-coverage [email or web address] Password [email or web address]  11/19/2016, 12:21 AM

## 2016-11-20 ENCOUNTER — Ambulatory Visit: Payer: PPO | Admitting: Cardiology

## 2016-11-20 ENCOUNTER — Inpatient Hospital Stay
Admission: RE | Admit: 2016-11-20 | Discharge: 2016-11-25 | Disposition: A | Discharge status: Home / Self Care | Payer: PPO | Source: Ambulatory Visit | Attending: Internal Medicine | Admitting: Internal Medicine

## 2016-11-20 ENCOUNTER — Encounter (HOSPITAL_COMMUNITY): Payer: Self-pay | Admitting: Family Medicine

## 2016-11-20 DIAGNOSIS — K219 Gastro-esophageal reflux disease without esophagitis: Secondary | ICD-10-CM | POA: Diagnosis not present

## 2016-11-20 DIAGNOSIS — I2510 Atherosclerotic heart disease of native coronary artery without angina pectoris: Secondary | ICD-10-CM | POA: Diagnosis not present

## 2016-11-20 DIAGNOSIS — E038 Other specified hypothyroidism: Secondary | ICD-10-CM | POA: Diagnosis not present

## 2016-11-20 DIAGNOSIS — I10 Essential (primary) hypertension: Secondary | ICD-10-CM

## 2016-11-20 DIAGNOSIS — M797 Fibromyalgia: Secondary | ICD-10-CM | POA: Diagnosis not present

## 2016-11-20 DIAGNOSIS — M12812 Other specific arthropathies, not elsewhere classified, left shoulder: Secondary | ICD-10-CM | POA: Diagnosis not present

## 2016-11-20 DIAGNOSIS — R748 Abnormal levels of other serum enzymes: Secondary | ICD-10-CM

## 2016-11-20 DIAGNOSIS — Z9181 History of falling: Secondary | ICD-10-CM | POA: Diagnosis not present

## 2016-11-20 DIAGNOSIS — R279 Unspecified lack of coordination: Secondary | ICD-10-CM | POA: Diagnosis not present

## 2016-11-20 DIAGNOSIS — F329 Major depressive disorder, single episode, unspecified: Secondary | ICD-10-CM | POA: Diagnosis not present

## 2016-11-20 DIAGNOSIS — F411 Generalized anxiety disorder: Secondary | ICD-10-CM | POA: Diagnosis not present

## 2016-11-20 DIAGNOSIS — E039 Hypothyroidism, unspecified: Secondary | ICD-10-CM | POA: Diagnosis not present

## 2016-11-20 DIAGNOSIS — M19012 Primary osteoarthritis, left shoulder: Secondary | ICD-10-CM | POA: Diagnosis not present

## 2016-11-20 DIAGNOSIS — M6282 Rhabdomyolysis: Secondary | ICD-10-CM | POA: Diagnosis not present

## 2016-11-20 DIAGNOSIS — M6281 Muscle weakness (generalized): Secondary | ICD-10-CM | POA: Diagnosis not present

## 2016-11-20 DIAGNOSIS — M1990 Unspecified osteoarthritis, unspecified site: Secondary | ICD-10-CM | POA: Diagnosis not present

## 2016-11-20 DIAGNOSIS — Z96612 Presence of left artificial shoulder joint: Secondary | ICD-10-CM | POA: Diagnosis not present

## 2016-11-20 DIAGNOSIS — R531 Weakness: Secondary | ICD-10-CM | POA: Diagnosis not present

## 2016-11-20 DIAGNOSIS — J449 Chronic obstructive pulmonary disease, unspecified: Secondary | ICD-10-CM | POA: Diagnosis not present

## 2016-11-20 DIAGNOSIS — W19XXXS Unspecified fall, sequela: Secondary | ICD-10-CM | POA: Diagnosis not present

## 2016-11-20 DIAGNOSIS — R2689 Other abnormalities of gait and mobility: Secondary | ICD-10-CM | POA: Diagnosis not present

## 2016-11-20 LAB — MAGNESIUM: MAGNESIUM: 1.7 mg/dL (ref 1.7–2.4)

## 2016-11-20 LAB — CBC
HCT: 29.4 % — ABNORMAL LOW (ref 36.0–46.0)
Hemoglobin: 9.9 g/dL — ABNORMAL LOW (ref 12.0–15.0)
MCH: 31.7 pg (ref 26.0–34.0)
MCHC: 33.7 g/dL (ref 30.0–36.0)
MCV: 94.2 fL (ref 78.0–100.0)
PLATELETS: 310 10*3/uL (ref 150–400)
RBC: 3.12 MIL/uL — ABNORMAL LOW (ref 3.87–5.11)
RDW: 12.7 % (ref 11.5–15.5)
WBC: 7.9 10*3/uL (ref 4.0–10.5)

## 2016-11-20 LAB — BASIC METABOLIC PANEL
Anion gap: 7 (ref 5–15)
BUN: 9 mg/dL (ref 6–20)
CALCIUM: 7.7 mg/dL — AB (ref 8.9–10.3)
CO2: 26 mmol/L (ref 22–32)
CREATININE: 0.39 mg/dL — AB (ref 0.44–1.00)
Chloride: 99 mmol/L — ABNORMAL LOW (ref 101–111)
GFR calc Af Amer: >60 mL/min (ref >60)
GLUCOSE: 134 mg/dL — AB (ref 65–99)
POTASSIUM: 3.8 mmol/L (ref 3.5–5.1)
SODIUM: 132 mmol/L — AB (ref 135–145)

## 2016-11-20 LAB — CK: CK TOTAL: 357 U/L — AB (ref 38–234)

## 2016-11-20 MED ORDER — HYDROMORPHONE HCL 2 MG PO TABS: 2.0000 mg | ORAL_TABLET | ORAL | 0 refills | Status: DC | PRN

## 2016-11-20 MED ORDER — POLYETHYLENE GLYCOL 3350 17 G PO PACK: 17.0000 g | PACK | Freq: Two times a day (BID) | ORAL | 0 refills | Status: DC

## 2016-11-20 NOTE — NC FL2 (Signed)
[place] LEVEL OF CARE SCREENING TOOL     IDENTIFICATION  Patient Name: [name] Birthdate: 02-16-1936 Sex: female Admission Date (Current Location): 11/18/2016  [facility] Number:  [employer] and Address:  [place] [address] [place]      Provider Number: [id number]  Attending Physician Name and Address:  Murlean Iba, MD  Relative Name and Phone Number:       Current Level of Care: Hospital Recommended Level of Care: [place] Prior Approval Number:    Date Approved/Denied:   PASRR Number: [id number] ([id number])  Discharge Plan: Home    Current Diagnoses: Patient Active Problem List   Diagnosis Date Noted  . Weakness 11/19/2016  . Rhabdomyolysis 11/19/2016  . Elevated troponin 11/19/2016  . DJD of left shoulder 11/15/2016  . Preop cardiovascular exam 11/11/2016  . DOE (dyspnea on exertion) 11/11/2016  . Rotator cuff arthropathy of left shoulder 10/30/2016  . Intertrochanteric fracture of right hip ([place]) 08/01/2015    Class: Acute  . Intertrochanteric fracture of right femur ([place]) 08/01/2015  . Hip fracture ([place]) 07/31/2015  . Abdominal pain 09/16/2014  . SMA stenosis ([place]) 09/15/2014  . Abdominal pain, right upper quadrant 09/15/2014  . Essential hypertension 09/15/2014  . Depression 09/15/2014  . Hypothyroidism 09/15/2014  . GERD without esophagitis 09/15/2014  . Lumbago 11/12/2013  . Stiffness of joint, not elsewhere classified, pelvic region and thigh 11/12/2013  . Muscle weakness (generalized) 11/12/2013  . Lack of coordination 03/21/2013  . Fine motor impairment 03/21/2013  . Left leg weakness 03/17/2013  . Risk for falls 03/17/2013  . Carotid artery disease ([place]) 03/11/2013  . Subclavian artery stenosis ([place]) 03/11/2013  . TIA (transient ischemic attack) 03/11/2013  . Tobacco use disorder 03/11/2013  . CVA (cerebral infarction) 03/05/2013  . Pain  in joint, shoulder region 11/01/2010    Orientation RESPIRATION BLADDER Height & Weight     Self, Time, Situation, Place  Normal Continent Weight: 120 lb (54.4 kg) Height:  5' 1.5" (156.2 cm)  BEHAVIORAL SYMPTOMS/MOOD NEUROLOGICAL BOWEL NUTRITION STATUS      Continent Diet (Carb Modified)  AMBULATORY STATUS COMMUNICATION OF NEEDS Skin   Limited Assist Verbally Surgical wounds (Left shoulder)                       Personal Care Assistance Level of Assistance  Bathing, Feeding, Dressing Bathing Assistance: Limited assistance Feeding assistance: Independent Dressing Assistance: Limited assistance     Functional Limitations Info  Sight, Hearing, Speech Sight Info: Adequate Hearing Info: Adequate Speech Info: Adequate    SPECIAL CARE FACTORS FREQUENCY  PT (By licensed PT), OT (By licensed OT)     PT Frequency: 5x/week OT Frequency: 3x/week            Contractures Contractures Info: Not present    Additional Factors Info  Code Status, Psychotropic, Allergies Code Status Info: Full Code Allergies Info: Aleve, Hydrocodone, Statins, Gluten Meal, Wheat Bran, Celebrex, Codeine, Morphine and related Psychotropic Info: Cymbalta         Current Medications (11/20/2016):  This is the current hospital active medication list Current Facility-Administered Medications  Medication Dose Route Frequency Provider Last Rate Last Dose  . 0.9 %  sodium chloride infusion   Intravenous Continuous Johnson, Clanford L, MD 65 mL/hr at 11/20/16 1019    . aspirin EC tablet 325 mg  325 mg Oral Q breakfast Phillips Grout, MD  325 mg at 11/20/16 1022  . bisacodyl (DULCOLAX) EC tablet 5 mg  5 mg Oral Daily Johnson, Clanford L, MD   5 mg at 11/19/16 2000  . diltiazem (CARDIZEM CD) 24 hr capsule 120 mg  120 mg Oral QPM Derrill Kay A, MD   120 mg at 11/19/16 1738  . DULoxetine (CYMBALTA) DR capsule 60 mg  60 mg Oral Daily Derrill Kay A, MD   60 mg at 11/20/16 1021  . HYDROmorphone  (DILAUDID) tablet 2 mg  2 mg Oral Q6H PRN Johnson, Clanford L, MD   2 mg at 11/20/16 1020  . levothyroxine (SYNTHROID, LEVOTHROID) tablet 88 mcg  88 mcg Oral QAC breakfast Phillips Grout, MD   88 mcg at 11/20/16 1022  . ondansetron (ZOFRAN) tablet 4 mg  4 mg Oral Q8H PRN Derrill Kay A, MD      . polyethylene glycol (MIRALAX / GLYCOLAX) packet 17 g  17 g Oral BID Wynetta Emery, Clanford L, MD   17 g at 11/19/16 2200     Discharge Medications: Please see discharge summary for a list of discharge medications.  Relevant Imaging Results:  Relevant Lab Results:   Additional Information SS#: [id number]  [name], LCSW

## 2016-11-20 NOTE — Discharge Instructions (Signed)
Follow with Primary MD  Hawkins, Edward, MD  and other consultant's as instructed your Hospitalist MD ° °Please get a complete blood count and chemistry panel checked by your Primary MD at your next visit, and again as instructed by your Primary MD. ° °Get Medicines reviewed and adjusted: °Please take all your medications with you for your next visit with your Primary MD ° °Laboratory/radiological data: °Please request your Primary MD to go over all hospital tests and procedure/radiological results at the follow up, please ask your Primary MD to get all Hospital records sent to his/her office. ° °In some cases, they will be blood work, cultures and biopsy results pending at the time of your discharge. Please request that your primary care M.D. follows up on these results. ° °Also Note the following: °If you experience worsening of your admission symptoms, develop shortness of breath, life threatening emergency, suicidal or homicidal thoughts you must seek medical attention immediately by calling 911 or calling your MD immediately  if symptoms less severe. ° °You must read complete instructions/literature along with all the possible adverse reactions/side effects for all the Medicines you take and that have been prescribed to you. Take any new Medicines after you have completely understood and accpet all the possible adverse reactions/side effects.  ° °Do not drive when taking Pain medications or sleeping medications (Benzodaizepines) ° °Do not take more than prescribed Pain, Sleep and Anxiety Medications. It is not advisable to combine anxiety,sleep and pain medications without talking with your primary care practitioner ° °Special Instructions: If you have smoked or chewed Tobacco  in the last 2 yrs please stop smoking, stop any regular Alcohol  and or any Recreational drug use. ° °Wear Seat belts while driving. ° °Please note: °You were cared for by a hospitalist during your hospital stay. Once you are discharged,  your primary care physician will handle any further medical issues. Please note that NO REFILLS for any discharge medications will be authorized once you are discharged, as it is imperative that you return to your primary care physician (or establish a relationship with a primary care physician if you do not have one) for your post hospital discharge needs so that they can reassess your need for medications and monitor your lab values. ° ° ° ° °

## 2016-11-20 NOTE — Clinical Social Work Placement (Signed)
   CLINICAL SOCIAL WORK PLACEMENT  NOTE  Date:  11/20/2016  Patient Details  Name: [name] MRN: [id number] Date of Birth: 11-21-1935  Clinical Social Work is seeking post-discharge placement for this patient at the [place] level of care (*CSW will initial, date and re-position this form in  chart as items are completed):  Yes   Patient/family provided with [facility] Work Department's list of facilities offering this level of care within the geographic area requested by the patient (or if unable, by the patient's family).  Yes   Patient/family informed of their freedom to choose among providers that offer the needed level of care, that participate in Medicare, Medicaid or managed care program needed by the patient, have an available bed and are willing to accept the patient.  Yes   Patient/family informed of [facility] ownership interest in [facility] and [facility], as well as of the fact that they are under no obligation to receive care at these facilities.  PASRR submitted to EDS on       PASRR number received on       Existing PASRR number confirmed on 11/20/16     FL2 transmitted to all facilities in geographic area requested by pt/family on 11/20/16     FL2 transmitted to all facilities within larger geographic area on       Patient informed that his/her managed care company has contracts with or will negotiate with certain facilities, including the following:        Yes   Patient/family informed of bed offers received.  Patient chooses bed at [facility]     Physician recommends and patient chooses bed at      Patient to be transferred to [facility] on 11/20/16.  Patient to be transferred to facility by [facility] staff     Patient family notified on 11/20/16 of transfer.  Name of family member notified:  Patient stated tha she would notify her son.     PHYSICIAN       Additional Comment:   Clinicals sent via [employer]. LCSW received 5 day authoirzation [phone]). LCSW provided authorization details to both [facility] and patient. LCSW signing off.  _______________________________________________ [name], LCSW 11/20/2016, 2:56 PM

## 2016-11-20 NOTE — Evaluation (Signed)
Physical Therapy Evaluation Patient Details Name: [name] MRN: [id number] DOB: 08/05/1935 Today's Date: 11/20/2016   History of Present Illness  [name] is a 81 y.o. female with medical history significant of recent left rotator cuff surgery on Thursday (today is sat) was sent home and lives alone.  She says she has not been eating or drinking much and has been weak.  Denies any fevers.  No n/v/d.  She says she went to the bathroom last night and did not have the strength to get up from the toilet and fell.  Her family found her hours later.  She denies any LOC.  She has been offered rehab and does not want that even though her family wants her too.  She denies any chest pain or sob, no cough.  Pt referred for admission for weakness, elev trop and cpk.    Clinical Impression  Patient limited for functional mobility as stated below secondary to BLE weakness, fatigue and poor standing balance. Patient able to transfer [facility] in bathroom prior to gait training in hallway.  Patient will benefit from continued physical therapy in hospital and recommended venue below to increase strength, balance, endurance for safe ADLs and gait.    Follow Up Recommendations SNF;Supervision/Assistance - 24 hour    Equipment Recommendations  None recommended by PT    Recommendations for Other Services       Precautions / Restrictions Precautions Precautions: Fall;Shoulder Type of Shoulder Precautions: Left total shoulder arthroplasty and left rotator cuff arthropathy. A/ROM hand, wrist, and elbow ok. No movement of left shoulder.  Shoulder Interventions: Shoulder sling/immobilizer;At all times;Off for dressing/bathing/exercises Precaution Comments: Reviewed precautions with pt Required Braces or Orthoses: Sling (sling left shoulder) Restrictions Weight Bearing Restrictions: No LUE Weight Bearing: Non weight bearing      Mobility  Bed Mobility Overal bed mobility: Needs Assistance Bed  Mobility: Supine to Sit;Sit to Supine     Supine to sit: Min assist Sit to supine: Min assist      Transfers Overall transfer level: Needs assistance Equipment used: 1 person hand held assist Transfers: Sit to/from [facility] Sit to Stand: Min assist Stand pivot transfers: Min assist          Ambulation/Gait Ambulation/Gait assistance: Min guard Ambulation Distance (Feet): 25 Feet Assistive device: Straight cane Gait Pattern/deviations: Decreased step length - right;Decreased step length - left;Decreased stride length   Gait velocity interpretation: Below normal speed for age/gender General Gait Details: Patient demonstrates slow labored cadence without loss of balance, limited secondary to c/o fatigue  Stairs            Wheelchair Mobility    Modified Rankin (Stroke Patients Only)       Balance Overall balance assessment: Needs assistance Sitting-balance support: Single extremity supported;Feet supported Sitting balance-Leahy Scale: Fair     Standing balance support: Single extremity supported;During functional activity Standing balance-Leahy Scale: Fair                               Pertinent Vitals/Pain Pain Assessment: No/denies pain Pain Score: 6  Pain Location: left shoulder Pain Descriptors / Indicators: Discomfort;Operative site guarding Pain Intervention(s): Monitored during session;Repositioned    Home Living Family/patient expects to be discharged to:: Private residence Living Arrangements: Alone (Her son lives nearby) Available Help at Discharge: Family Type of Home: House Home Access: Level entry Entrance Stairs-Rails: None Entrance Stairs-Number of Steps: 4 Home Layout: One  level Home Equipment: Walker - 2 wheels;Cane - single point;Shower seat - built in Additional Comments: Patient states she has a Hem-walker also, usually ambulates without assistive device prior to weakness/surgery    Prior Function  Level of Independence: Independent with assistive device(s)         Comments: Pt lived alone. Reports having a life alert system due to frequent falls. Sons/neighbors nearby to help.     Hand Dominance   Dominant Hand: Left    Extremity/Trunk Assessment   Upper Extremity Assessment Upper Extremity Assessment: Defer to OT evaluation LUE Deficits / Details: s/p right total shoulder arthroplasty & rotator cuff repair. Increased edema in LUE.  LUE: Unable to fully assess due to immobilization    Lower Extremity Assessment Lower Extremity Assessment: Generalized weakness    Cervical / Trunk Assessment Cervical / Trunk Assessment: Kyphotic  Communication   Communication: No difficulties  Cognition Arousal/Alertness: Awake/alert Behavior During Therapy: WFL for tasks assessed/performed Overall Cognitive Status: Within Functional Limits for tasks assessed Area of Impairment: Memory                     Memory: Decreased short-term memory;Decreased recall of precautions         General Comments: Family present and able to report that patient's information was not accurate.       General Comments      Exercises Other Exercises Other Exercises: Proper positioning of LUE while in bed with sling on to decrease edema. Donning/doffing sling/immobilizer:  (Total Assist) Correct positioning of sling/immobilizer:  (Total Assist) ROM for elbow, wrist and digits of operated UE: Supervision/safety (Needs more instruction)   Assessment/Plan    PT Assessment Patient needs continued PT services  PT Problem List Decreased strength;Decreased activity tolerance;Decreased balance;Decreased mobility       PT Treatment Interventions Gait training;Functional mobility training;Therapeutic activities;Therapeutic exercise;Patient/family education    PT Goals (Current goals can be found in the Care Plan section)  Acute Rehab PT Goals Patient Stated Goal: Return home  PT Goal  Formulation: With patient Time For Goal Achievement: 12/04/16    Frequency Min 3X/week   Barriers to discharge        Co-evaluation               AM-PAC PT "6 Clicks" Daily Activity  Outcome Measure Difficulty turning over in bed (including adjusting bedclothes, sheets and blankets)?: A Little Difficulty moving from lying on back to sitting on the side of the bed? : A Little Difficulty sitting down on and standing up from a chair with arms (e.g., wheelchair, bedside commode, etc,.)?: A Little Help needed moving to and from a bed to chair (including a wheelchair)?: A Little Help needed walking in hospital room?: A Little Help needed climbing 3-5 steps with a railing? : A Lot 6 Click Score: 17    End of Session   Activity Tolerance: Patient limited by fatigue;Patient tolerated treatment well Patient left: in bed;with call bell/phone within reach;with bed alarm set Nurse Communication: Mobility status PT Visit Diagnosis: Unsteadiness on feet (R26.81);Other abnormalities of gait and mobility (R26.89);Muscle weakness (generalized) (M62.81)    Time: 7673-4193 PT Time Calculation (min) (ACUTE ONLY): 32 min   Charges:   PT Evaluation $PT Eval Low Complexity: 1 Low PT Treatments $Therapeutic Activity: 23-37 mins   PT G Codes:   PT G-Codes **NOT FOR INPATIENT CLASS** Functional Assessment Tool Used: AM-PAC 6 Clicks Basic Mobility Functional Limitation: Mobility: Walking and moving around Mobility: Walking and  Moving Around Current Status [phone]): At least 40 percent but less than 60 percent impaired, limited or restricted Mobility: Walking and Moving Around Goal Status [phone]): At least 40 percent but less than 60 percent impaired, limited or restricted Mobility: Walking and Moving Around Discharge Status [phone]): At least 40 percent but less than 60 percent impaired, limited or restricted     1:37 PM, 11/20/16 Lonell Grandchild, MPT Physical Therapist with [facility] [phone] office [phone] mobile phone

## 2016-11-20 NOTE — Clinical Social Work Note (Signed)
Clinical Social Work Assessment  Patient Details  Name: [name] MRN: [id number] Date of Birth: 09-25-1935  Date of referral:  11/20/16               Reason for consult:  Discharge Planning                Permission sought to share information with:    Permission granted to share information::     Name::        Agency::     Relationship::     Contact Information:     Housing/Transportation Living arrangements for the past 2 months:  Single Family Home Source of Information:  Patient Patient Interpreter Needed:  None Criminal Activity/Legal Involvement Pertinent to Current Situation/Hospitalization:  No - Comment as needed Significant Relationships:  Adult Children Lives with:  Self Do you feel safe going back to the place where you live?  Yes Need for family participation in patient care:  Yes (Comment)  Care giving concerns: Very independent at baseline.    Social Worker assessment / plan:  Patient lives alone and at baseline ambulates with a cane, completes ADLs unassisted, and drives. She is interested in going to [facility] for discharge or [facility] for short term rehab.   Employment status:  Retired Nurse, adult PT Recommendations:  [place], [place] / Referral to community resources:  [place]  Patient/Family's Response to care:  Patient is agreeable to SNF.   Patient/Family's Understanding of and Emotional Response to Diagnosis, Current Treatment, and Prognosis:  Patient understands her diagnosis, treatment and prognosis.   Emotional Assessment Appearance:  Appears stated age Attitude/Demeanor/Rapport:    Affect (typically observed):  Accepting Orientation:  Oriented to Self, Oriented to Place, Oriented to  Time, Oriented to Situation Alcohol / Substance use:  Not Applicable Psych involvement (Current and /or in the community):  No (Comment)  Discharge Needs  Concerns to be  addressed:  Discharge Planning Concerns Readmission within the last 30 days:  No Current discharge risk:  None Barriers to Discharge:  No Barriers Identified   [name], LCSW 11/20/2016, 2:59 PM

## 2016-11-20 NOTE — Care Management Note (Signed)
Case Management Note  Patient Details  Name: [name] MRN: [id number] Date of Birth: Jul 06, 1935  Subjective/Objective:                  Admitted with weakness after fall at home. Pt had shoulder surgery last week, DC'd home with no HH. Son and DIL live next door, Son has tried to stay with pt at night but pt doesn't want him to. PT has recommended SNF and pt is now agreeable. CSW aware.   Action/Plan: CSW to make placement arrangements. No CM needs noted at this time. Anticipate DC today or tomorrow.   Expected Discharge Date:      11/20/2016            Expected Discharge Plan:  [place]  In-House Referral:  Clinical Social Work  Discharge planning Services  CM Consult  Post Acute Care Choice:  NA Choice offered to:  NA  Status of Service:  Completed, signed off  Sherald Barge, RN 11/20/2016, 10:12 AM

## 2016-11-20 NOTE — Progress Notes (Signed)
Report given to Rod Holler, RN at [facility].

## 2016-11-20 NOTE — Care Management Obs Status (Signed)
[place] NOTIFICATION   Patient Details  Name: [name] MRN: [id number] Date of Birth: 29-Mar-1935   Medicare Observation Status Notification Given:  Yes    Sherald Barge, RN 11/20/2016, 9:35 AM

## 2016-11-20 NOTE — Evaluation (Signed)
Occupational Therapy Evaluation Patient Details Name: [name] MRN: [id number] DOB: 1935/11/30 Today's Date: 11/20/2016    History of Present Illness [name] is a 81 y.o. female with medical history significant of recent left rotator cuff surgery on Wednesday (11/15/16) was sent home and lives alone.  She says she has not been eating or drinking much and has been weak.  Denies any fevers. She says she went to the bathroom and did not have the strength to get up from the toilet and fell.  Family reports that they were in the next room during the fall.  She denies any LOC.   Clinical Impression   Pt in bed upon therapy arrival with family present. Patient finished with breakfast and agreeable to participate in OT evaluation. From family reports, patient has been having a difficult time completing ADLs and with mobility even prior to her left shoulder surgery. Since her surgery, she has not been managing her medication correctly and has been getting weaker and weaker which resulted in her fall from the toilet. During evaluation, patient required increased assistance with bed mobility, hospital gown change, toilet hygiene, and basic ADL tasks. Patient required constant education regarding shoulder precautions despite having been through previous shoulder surgeries. Patient presents with increased edema in entire LUE. Therapist exchanged medium sling for a large for better comfort level and fit. Recommended SNF at discharge due to decreased safety and ability to return to complete Basic ADL tasks. Family is unable to provided 24 hour supervision and assistance. Pt will benefit from skilled OT services while in acute to focus on mentioned deficits.     Follow Up Recommendations  SNF    Equipment Recommendations  None recommended by OT       Precautions / Restrictions Precautions Precautions: Shoulder Type of Shoulder Precautions: Left total shoulder arthroplasty and left rotator cuff  arthropathy. A/ROM hand, wrist, and elbow ok. No movement of left shoulder.  Shoulder Interventions: Shoulder sling/immobilizer;At all times;Off for dressing/bathing/exercises Precaution Comments: Reviewed precautions with pt Required Braces or Orthoses: Sling Restrictions Weight Bearing Restrictions: Yes LUE Weight Bearing: Non weight bearing      Mobility Bed Mobility Overal bed mobility: Needs Assistance Bed Mobility: Supine to Sit;Sit to Supine     Supine to sit: Mod assist;HOB elevated Sit to supine: Min assist      Transfers Overall transfer level: Needs assistance Equipment used: 1 person hand held assist Transfers: Sit to/from Stand Sit to Stand: Min assist                  ADL either performed or assessed with clinical judgement   ADL Overall ADL's : Needs assistance/impaired     Grooming: Oral care;Set up;Standing Grooming Details (indicate cue type and reason): Pt able to manage dentures including brushing with toothbrush and toothpaste.          Upper Body Dressing : Total assistance;Sitting ([place])   Lower Body Dressing: Total assistance;Bed level Lower Body Dressing Details (indicate cue type and reason): donning/doffing socks Toilet Transfer: Moderate assistance;Ambulation;Grab Information systems manager Details (indicate cue type and reason): Due to low level of toilet, patient required more assistance to complete sit to stand. Toileting- Clothing Manipulation and Hygiene: Total assistance;Sit to/from stand Toileting - Clothing Manipulation Details (indicate cue type and reason): Due to bowl incontinence, patient required more assistance.              Vision Baseline Vision/History: No visual deficits Patient Visual Report: No  change from baseline              Pertinent Vitals/Pain Pain Assessment: 0-10 Pain Score: 6  Pain Location: left shoulder Pain Descriptors / Indicators: Discomfort;Operative site  guarding Pain Intervention(s): Monitored during session;Repositioned     Hand Dominance Left   Extremity/Trunk Assessment Upper Extremity Assessment Upper Extremity Assessment: LUE deficits/detail;Generalized weakness LUE Deficits / Details: s/p right total shoulder arthroplasty & rotator cuff repair. Increased edema in LUE.  LUE: Unable to fully assess due to immobilization   Lower Extremity Assessment Lower Extremity Assessment: Defer to PT evaluation       Communication Communication Communication: No difficulties   Cognition Arousal/Alertness: Awake/alert Behavior During Therapy: WFL for tasks assessed/performed Overall Cognitive Status: Impaired/Different from baseline Area of Impairment: Memory       Memory: Decreased short-term memory;Decreased recall of precautions         General Comments: Family present and able to report that patient's information was not accurate.       Exercises Other Exercises Other Exercises: Proper positioning of LUE while in bed with sling on to decrease edema.   Shoulder Instructions Shoulder Instructions Donning/doffing sling/immobilizer:  (Total Assist) Correct positioning of sling/immobilizer:  (Total Assist) ROM for elbow, wrist and digits of operated UE: Supervision/safety (Needs more instruction)    Home Living Family/patient expects to be discharged to:: Skilled nursing facility Living Arrangements: Alone Available Help at Discharge: Family;Available PRN/intermittently Type of Home: House Home Access: Stairs to enter [employer] of Steps: 4 Entrance Stairs-Rails: None Home Layout: One level     Bathroom Shower/Tub: Occupational psychologist: Handicapped height Bathroom Accessibility: Yes How Accessible: Accessible via walker Home Equipment: Shower seat - built in;Grab bars - toilet;Grab bars - tub/shower;Cane - single point          Prior Functioning/Environment Level of Independence:  Independent with assistive device(s)        Comments: Pt lived alone. Reports having a life alert system due to frequent falls. Sons/neighbors nearby to help.        OT Problem List: Decreased strength;Decreased range of motion;Decreased activity tolerance;Decreased knowledge of precautions;Impaired UE functional use;Increased edema;Pain;Decreased safety awareness      OT Treatment/Interventions: Self-care/ADL training;Therapeutic exercise;Manual therapy;DME and/or AE instruction;Energy conservation;Modalities;Therapeutic activities;Patient/family education    OT Goals(Current goals can be found in the care plan section) Acute Rehab OT Goals Patient Stated Goal: To return home safely OT Goal Formulation: With patient/family Time For Goal Achievement: 12/04/16 Potential to Achieve Goals: Good  OT Frequency: Min 2X/week   Barriers to D/C: Decreased caregiver support  Pt lives alone          AM-PAC PT "6 Clicks" Daily Activity     Outcome Measure Help from another person eating meals?: A Little Help from another person taking care of personal grooming?: A Little Help from another person toileting, which includes using toliet, bedpan, or urinal?: Total Help from another person bathing (including washing, rinsing, drying)?: Total Help from another person to put on and taking off regular upper body clothing?: Total Help from another person to put on and taking off regular lower body clothing?: Total 6 Click Score: 10   End of Session Nurse Communication: Other (comment) (removal of periwick catheter, loose stools, and level of pain)  Activity Tolerance: Patient tolerated treatment well Patient left: in bed;with call bell/phone within reach;with bed alarm set;with family/visitor present  OT Visit Diagnosis: Muscle weakness (generalized) (M62.81);Pain Pain - Right/Left: Left Pain - part  of body: Shoulder                Time: 1031-5945 OT Time Calculation (min): 49 min Charges:   OT General Charges $OT Visit: 1 Visit OT Evaluation $OT Eval High Complexity: 1 High OT Treatments $Self Care/Home Management : 23-37 mins (30') $Orthotics Fit/Training: 8-22 mins (8') G-Codes: OT G-codes **NOT FOR INPATIENT CLASS** Functional Assessment Tool Used: AM-PAC 6 Clicks Daily Activity Functional Limitation: Self care Self Care Current Status (O5929): At least 60 percent but less than 80 percent impaired, limited or restricted Self Care Goal Status (W4462): At least 40 percent but less than 60 percent impaired, limited or restricted   Ailene Ravel, OTR/L,CBIS  [phone]   Kathia Covington, Clarene Duke 11/20/2016, 11:02 AM

## 2016-11-20 NOTE — Discharge Summary (Addendum)
Physician Discharge Summary  [name] CZY:[id number] DOB: 07/23/1935 DOA: 11/18/2016  PCP: Sinda Du, MD Orthopedics: Karrie Meres date: 11/18/2016 Discharge date: 11/20/2016  Admitted From: Home  Disposition: SNF   Recommendations for Outpatient Follow-up:  1. Follow up with PCP in 2 weeks 2. Follow up with orthopedics in 1 week 3. Please obtain BMP/CBC/CK in one week  Discharge Condition: STABLE   CODE STATUS: FULL    Brief Hospitalization Summary: Please see all hospital notes, images, labs for full details of the hospitalization.  From HPI: [name] is a 81 y.o. female with medical history significant of recent left rotator cuff surgery on Thursday (11/15/16) was sent home and lives alone.  She says she has not been eating or drinking much and has been weak.  Denies any fevers.  No n/v/d.  She says she went to the bathroom last night and did not have the strength to get up from the toilet and fell.  Her family found her hours later.  She denies any LOC.  She has been offered rehab and does not want that even though her family wants her too.  She denies any chest pain or sob, no cough.  Pt referred for admission for weakness, elev trop and cpk.  The patient was admitted with a fall and rhabdomyolysis. She was treated with IV fluids. Her CK levels were monitored closely and trended down. She initially was hesitant to consider rehabilitation but eventually decided to put his pain. She remained on her home medications for hypertension. She missed her appointment with Dr. Percell Miller for orthopedic follow-up but it will need to be rescheduled within a week. She did have a mildly elevated troponin thought to be related to the rhabdomyolysis. Troponin remained flat and down. She was given laxative therapy for constipation with good relief of symptoms.  Discharge Diagnoses:  Principal Problem:   Weakness Active Problems:   Essential hypertension   Rotator cuff arthropathy of  left shoulder   Rhabdomyolysis   Elevated troponin  Discharge Instructions:  Allergies as of 11/20/2016      Reactions   Aleve [naproxen Sodium] Hives, Palpitations   Hydrocodone Nausea Only   Statins Other (See Comments)   Muscle pain   Gluten Meal    Wheat Bran    Gluten intolerant   Celebrex [celecoxib] Nausea And Vomiting   Codeine Nausea And Vomiting   Morphine And Related Nausea And Vomiting      Medication List    STOP taking these medications   niacin 500 MG tablet     TAKE these medications   aspirin 325 MG EC tablet Take 1 tablet (325 mg total) by mouth daily with breakfast. What changed:  Another medication with the same name was removed. Continue taking this medication, and follow the directions you see here.   Calcium 200 MG Tabs Take 400 mg by mouth daily.   CHANTIX 1 MG tablet Generic drug:  varenicline TAKE 1 TABLET BY MOUTH ONCE DAILY AS NEEDED FOR SMOKING   diltiazem 120 MG 24 hr capsule Commonly known as:  DILACOR XR Take 120 mg by mouth every evening.   diphenhydrAMINE 25 MG tablet Commonly known as:  SOMINEX Take 50 mg by mouth at bedtime.   DULoxetine 60 MG capsule Commonly known as:  CYMBALTA Take 60 mg by mouth daily.   HYDROmorphone 2 MG tablet Commonly known as:  DILAUDID Take 1 tablet (2 mg total) by mouth every 4 (four) hours as needed for  severe pain. What changed:  how much to take  how to take this  when to take this  reasons to take this  additional instructions   levothyroxine 88 MCG tablet Commonly known as:  SYNTHROID, LEVOTHROID Take 1 tablet (88 mcg total) by mouth daily. What changed:  when to take this   multivitamin with minerals Tabs tablet Take 1 tablet by mouth daily.   MYRBETRIQ 50 MG Tb24 tablet Generic drug:  mirabegron ER Take 50 mg by mouth daily.   ondansetron 4 MG tablet Commonly known as:  ZOFRAN Take 1 tablet (4 mg total) by mouth every 8 (eight) hours as needed for nausea or vomiting.    polyethylene glycol packet Commonly known as:  MIRALAX / GLYCOLAX Take 17 g by mouth 2 (two) times daily.   vitamin C 1000 MG tablet Take 1,000 mg by mouth 2 (two) times daily.      Follow-up Information    Sinda Du, MD. Schedule an appointment as soon as possible for a visit in 2 week(s).   Specialty:  Pulmonary Disease Contact information: [place] [address] [phone]        [name], MD. Schedule an appointment as soon as possible for a visit in 1 week(s).   Specialty:  Orthopedic Surgery Contact information: [address] [place] [address] [phone]          Allergies  Allergen Reactions  . Aleve [Naproxen Sodium] Hives and Palpitations  . Hydrocodone Nausea Only  . Statins Other (See Comments)    Muscle pain  . Gluten Meal   . Wheat Bran     Gluten intolerant  . Celebrex [Celecoxib] Nausea And Vomiting  . Codeine Nausea And Vomiting  . Morphine And Related Nausea And Vomiting   Current Discharge Medication List    START taking these medications   Details  polyethylene glycol (MIRALAX / GLYCOLAX) packet Take 17 g by mouth 2 (two) times daily. Qty: 14 each, Refills: 0      CONTINUE these medications which have CHANGED   Details  HYDROmorphone (DILAUDID) 2 MG tablet Take 1 tablet (2 mg total) by mouth every 4 (four) hours as needed for severe pain. Qty: 10 tablet, Refills: 0      CONTINUE these medications which have NOT CHANGED   Details  Ascorbic Acid (VITAMIN C) 1000 MG tablet Take 1,000 mg by mouth 2 (two) times daily.     aspirin EC 325 MG EC tablet Take 1 tablet (325 mg total) by mouth daily with breakfast. Qty: 30 tablet, Refills: 0    Calcium 200 MG TABS Take 400 mg by mouth daily.    CHANTIX 1 MG tablet TAKE 1 TABLET BY MOUTH ONCE DAILY AS NEEDED FOR SMOKING Refills: 2    diltiazem (DILACOR XR) 120 MG 24 hr capsule Take 120 mg by mouth every evening.     diphenhydrAMINE (SOMINEX) 25 MG tablet Take 50 mg by mouth at bedtime.     DULoxetine (CYMBALTA) 60 MG capsule Take 60 mg by mouth daily.    levothyroxine (SYNTHROID, LEVOTHROID) 88 MCG tablet Take 1 tablet (88 mcg total) by mouth daily. Qty: 30 tablet, Refills: 1    mirabegron ER (MYRBETRIQ) 50 MG TB24 tablet Take 50 mg by mouth daily.    Multiple Vitamin (MULTIVITAMIN WITH MINERALS) TABS Take 1 tablet by mouth daily.    ondansetron (ZOFRAN) 4 MG tablet Take 1 tablet (4 mg total) by  mouth every 8 (eight) hours as needed for nausea or vomiting. Qty: 40 tablet, Refills: 0      STOP taking these medications     niacin 500 MG tablet         Procedures/Studies: Dg Chest 1 View  Result Date: 11/18/2016 CLINICAL DATA:  [place] onto the bathroom floor today. EXAM: CHEST 1 VIEW COMPARISON:  Left shoulder radiographs obtained today. Chest and right rib radiographs dated 07/18/2016. FINDINGS: Normal sized heart. Aortic calcifications. Clear lungs. Old, healed right fifth lateral rib fracture. The previously demonstrated acute right posterior fifth rib fracture is not currently visualized. Probable callus formation associated with an old left posterior tenth rib fracture. Superior migration of the right humeral head with extensive spur formation. IMPRESSION: 1. No acute abnormality. 2. Marked right glenohumeral joint degenerative changes with evidence of a large, chronic right rotator cuff tear. Electronically Signed   By: Claudie Revering M.D.   On: 11/18/2016 19:24   Ct Head Wo Contrast  Result Date: 11/18/2016 CLINICAL DATA:  Forehead hematoma after falling and hitting her head on the bathroom floor today. EXAM: CT HEAD WITHOUT CONTRAST TECHNIQUE: Contiguous axial images were obtained from the base of the skull through the vertex without intravenous contrast. COMPARISON:  02/28/2013. FINDINGS: Brain: Diffusely enlarged ventricles and subarachnoid spaces. Patchy white matter low density in both  cerebral hemispheres. No intracranial hemorrhage, mass lesion or CT evidence of acute infarction. Vascular: No hyperdense vessel or unexpected calcification. Skull: Normal. Negative for fracture or focal lesion. Sinuses/Orbits: Status post bilateral cataract extraction. A small metallic structure is again demonstrated at the superior medial aspect of the left globe. Normally pneumatized paranasal sinuses. Other: None. IMPRESSION: 1. No acute abnormality. 2. Mildly progressive diffuse cerebral and cerebellar atrophy. 3. Marked diffuse chronic small vessel white matter ischemic changes in both cerebral hemispheres and in the cerebellum, with progression. 4. Stable small metallic foreign body in the superior medial margin of the left globe. This could represent a surgically placed object or a foreign body. This would preclude MRI unless this can be proven MR safe. Electronically Signed   By: Claudie Revering M.D.   On: 11/18/2016 19:17   US Carotid Duplex Bilateral  Result Date: 11/13/2016 CLINICAL DATA:  81 year old female with a history of carotid disease. Cardiovascular risk factors include hypertension, known prior stroke/ TIA, known coronary disease, known vascular disease with prior carotid stenting, hyperlipidemia, tobacco use EXAM: BILATERAL CAROTID DUPLEX ULTRASOUND TECHNIQUE: Pearline Cables scale imaging, color Doppler and duplex ultrasound were performed of bilateral carotid and vertebral arteries in the neck. COMPARISON:  Duplex 10/22/2010 FINDINGS: Criteria: Quantification of carotid stenosis is based on velocity parameters that correlate the residual internal carotid diameter with NASCET-based stenosis levels, using the diameter of the distal internal carotid lumen as the denominator for stenosis measurement. The following velocity measurements were obtained: RIGHT ICA:  Systolic 417 cm/sec, Diastolic 26 cm/sec CCA:  408 cm/sec SYSTOLIC ICA/CCA RATIO:  1.1 ECA:  90 cm/sec LEFT ICA:  Systolic 144 cm/sec, Diastolic  28 cm/sec CCA:  98 cm/sec SYSTOLIC ICA/CCA RATIO:  1.7 ECA:  295 cm/sec Right Brachial SBP: Not acquired Left Brachial SBP: Not acquired RIGHT CAROTID ARTERY: Calcifications of the right common carotid artery. Intermediate waveform maintained. Heterogeneous and partially calcified plaque at the right carotid bifurcation. No significant lumen shadowing. Low resistance waveform of the right ICA. Mild tortuosity RIGHT VERTEBRAL ARTERY: Antegrade flow with low resistance waveform. LEFT CAROTID ARTERY: No evidence of stenting of the left carotid system. Calcification  of the left common carotid artery. Intermediate waveform maintained. Heterogeneous and partially calcified plaque at the left carotid bifurcation with lumen shadowing. Low resistance waveform of the left ICA. Mild tortuosity LEFT VERTEBRAL ARTERY:  Antegrade flow with low resistance waveform. IMPRESSION: Right: Color duplex indicates moderate heterogeneous and calcified plaque, with no hemodynamically significant stenosis by duplex criteria in the extracranial cerebrovascular circulation. Left: Color duplex indicates moderate heterogeneous and calcified plaque, with 50%- 69% stenosis by established duplex criteria. Note that the flow velocities of the left ICA were obtained from an area distal to the maximum narrowing due to the presence of anterior wall plaque with shadowing and may be underestimating the percentage of ICA stenosis. If there is concern for more precise evaluation of degree of stenosis, consider either formal cerebral angiogram, or alternatively, CT angiogram. Signed, Dulcy Fanny. Earleen Newport, DO [facility] Electronically Signed   By: Corrie Mckusick D.O.   On: 11/13/2016 13:26   Dg Shoulder Left  Result Date: 11/18/2016 CLINICAL DATA:  Left shoulder pain following a fall onto the bathroom floor today. EXAM: LEFT SHOULDER - 2+ VIEW COMPARISON:  11/15/2016. FINDINGS: Stable left shoulder  prosthesis. No fracture or dislocation seen. Cervical spine fixation hardware. IMPRESSION: No fracture or dislocation. Electronically Signed   By: Claudie Revering M.D.   On: 11/18/2016 19:18   Dg Shoulder Left Port  Result Date: 11/15/2016 CLINICAL DATA:  Status post left shoulder replaced EXAM: LEFT SHOULDER - 1 VIEW COMPARISON:  None. FINDINGS: Left shoulder replacement is identified without malalignment. Visualized lung fields and left ribs are normal P IMPRESSION: Left shoulder replacement without malalignment. Electronically Signed   By: Abelardo Diesel M.D.   On: 11/15/2016 11:42   Dg Hip Unilat With Pelvis 2-3 Views Left  Result Date: 11/18/2016 CLINICAL DATA:  Left hip pain following a fall onto the bathroom floor today. EXAM: DG HIP (WITH OR WITHOUT PELVIS) 2-3V LEFT COMPARISON:  None. FINDINGS: Diffuse osteopenia. Hardware fixation of the proximal right femur. No fracture or dislocation seen. Lower lumbar spine degenerative changes. Atheromatous arterial calcifications. IMPRESSION: No fracture or dislocation. Electronically Signed   By: Claudie Revering M.D.   On: 11/18/2016 19:19      Subjective: Pt reports that she had a bowel movement this morning and feeling better.  Agreeable to rehab.    Discharge Exam: Vitals:   11/19/16 2100 11/20/16 0609  BP: (!) 157/61 (!) 167/63  Pulse: (!) 102 89  Resp: 18 16  Temp: 98.2 F (36.8 C) 98.8 F (37.1 C)  SpO2: 92% 93%   Vitals:   11/19/16 0534 11/19/16 1300 11/19/16 2100 11/20/16 0609  BP: (!) 143/52 (!) 143/55 (!) 157/61 (!) 167/63  Pulse: 88 100 (!) 102 89  Resp: 16 16 18 16   Temp: 98.5 F (36.9 C) 97.6 F (36.4 C) 98.2 F (36.8 C) 98.8 F (37.1 C)  TempSrc: Oral Oral Oral Oral  SpO2: 97% 91% 92% 93%  Weight:      Height:       General: Pt is alert, awake, not in acute distress Cardiovascular: normal S1/S2 +, no rubs, no gallops Respiratory: CTA bilaterally, no wheezing, no rhonchi Abdominal: Soft, NT, ND, bowel sounds  + Extremities: left shoulder in sling   The results of significant diagnostics from this hospitalization (including imaging, microbiology, ancillary and laboratory) are listed below for reference.     Microbiology: No results found for this or any previous visit (from the past 240 hour(s)).  Labs: BNP (last 3 results) No results for input(s): BNP in the last 8760 hours. Basic Metabolic Panel:  Recent Labs Lab 11/18/16 1809 11/19/16 0636 11/20/16 0657  NA 133* 131* 132*  K 4.1 3.7 3.8  CL 93* 96* 99*  CO2 31 27 26   GLUCOSE 148* 86 134*  BUN 26* 17 9  CREATININE 0.62 0.45 0.39*  CALCIUM 8.4* 7.6* 7.7*  MG  --  1.6* 1.7   Liver Function Tests:  Recent Labs Lab 11/18/16 1809  AST 56*  ALT 25  ALKPHOS 44  BILITOT 0.8  PROT 6.2*  ALBUMIN 3.6   No results for input(s): LIPASE, AMYLASE in the last 168 hours. No results for input(s): AMMONIA in the last 168 hours. CBC:  Recent Labs Lab 11/16/16 0556 11/18/16 1809 11/19/16 0636 11/20/16 0657  WBC 9.8 9.4 7.4 7.9  NEUTROABS  --  7.9*  --   --   HGB 10.6* 10.1* 9.5* 9.9*  HCT 31.3* 29.5* 27.7* 29.4*  MCV 93.4 93.1 92.0 94.2  PLT 239 265 254 310   Cardiac Enzymes:  Recent Labs Lab 11/18/16 2201 11/19/16 0019 11/19/16 0636 11/19/16 1240 11/20/16 0657  CKTOTAL 1,063* 824* 536*  --  357*  TROPONINI 0.06* 0.05* 0.05* 0.03*  --    BNP: Invalid input(s): POCBNP CBG: No results for input(s): GLUCAP in the last 168 hours. D-Dimer No results for input(s): DDIMER in the last 72 hours. Hgb A1c No results for input(s): HGBA1C in the last 72 hours. Lipid Profile No results for input(s): CHOL, HDL, LDLCALC, TRIG, CHOLHDL, LDLDIRECT in the last 72 hours. Thyroid function studies No results for input(s): TSH, T4TOTAL, T3FREE, THYROIDAB in the last 72 hours.  Invalid input(s): FREET3 Anemia work up No results for input(s): VITAMINB12, FOLATE, FERRITIN, TIBC, IRON, RETICCTPCT in the last 72 hours. Urinalysis     Component Value Date/Time   COLORURINE YELLOW 11/18/2016 2011   APPEARANCEUR CLEAR 11/18/2016 2011   LABSPEC 1.016 11/18/2016 2011   PHURINE 5.0 11/18/2016 2011   GLUCOSEU NEGATIVE 11/18/2016 2011   HGBUR NEGATIVE 11/18/2016 2011   [place] NEGATIVE 11/18/2016 2011   [place] NEGATIVE 11/18/2016 2011   PROTEINUR NEGATIVE 11/18/2016 2011   UROBILINOGEN 0.2 10/22/2014 0019   NITRITE NEGATIVE 11/18/2016 2011   LEUKOCYTESUR NEGATIVE 11/18/2016 2011   Sepsis Labs Invalid input(s): PROCALCITONIN,  WBC,  LACTICIDVEN Microbiology No results found for this or any previous visit (from the past 240 hour(s)).  Time coordinating discharge: 35 minutes  SIGNED:  Irwin Brakeman, MD  [facility] 11/20/2016, 12:07 PM Pager [phone]  If 7PM-7AM, please contact night-coverage [email or web address] Password [email or web address]

## 2016-11-20 NOTE — Progress Notes (Signed)
Discharged to [facility] in stable condition via w/c with staff.

## 2016-11-21 ENCOUNTER — Non-Acute Institutional Stay (SKILLED_NURSING_FACILITY): Payer: PPO | Admitting: Internal Medicine

## 2016-11-21 ENCOUNTER — Encounter: Payer: Self-pay | Admitting: Internal Medicine

## 2016-11-21 DIAGNOSIS — Z96612 Presence of left artificial shoulder joint: Secondary | ICD-10-CM

## 2016-11-21 DIAGNOSIS — E038 Other specified hypothyroidism: Secondary | ICD-10-CM | POA: Diagnosis not present

## 2016-11-21 DIAGNOSIS — W19XXXS Unspecified fall, sequela: Secondary | ICD-10-CM

## 2016-11-21 DIAGNOSIS — I10 Essential (primary) hypertension: Secondary | ICD-10-CM

## 2016-11-21 DIAGNOSIS — J449 Chronic obstructive pulmonary disease, unspecified: Secondary | ICD-10-CM

## 2016-11-21 DIAGNOSIS — W19XXXA Unspecified fall, initial encounter: Secondary | ICD-10-CM | POA: Insufficient documentation

## 2016-11-21 NOTE — Progress Notes (Signed)
Provider: Veleta Miners  Location:    [facility] Room Number: 121/D Place of Service:  SNF (31)  PCP: Sinda Du, MD Patient Care Team: Sinda Du, MD as PCP - General (Internal Medicine) [name], MD as Consulting Physician (Orthopedic Surgery)  Extended Emergency Contact Information Primary Emergency Contact: Merilynn Finland, [place] [address] Johnnette Litter of [place] Phone: [phone] Mobile Phone: [phone] Relation: Son Secondary Emergency Contact: Charlynne Cousins, [place] of [place] Phone: [phone] Mobile Phone: [phone] Relation: Son  Code Status: Full Code Goals of Care: Advanced Directive information Advanced Directives 11/21/2016  Does Patient Have a Medical Advance Directive? Yes  Type of Advance Directive (No Data)  Does patient want to make changes to medical advance directive? No - Patient declined  Copy of [place] in Chart? -  Would patient like information on creating a medical advance directive? -  Pre-existing out of facility DNR order (yellow form or pink MOST form) -      Chief Complaint  Patient presents with  . New Admit To SNF    New Admission Visit    HPI: Patient is a 81 y.o. female seen today for admission to SNF for therapy.  Patient has h/o PAD, s/p Carotid Endarterectomy,  Hypertension, Arthritis, recurrent falls.  She underwent Left Total Shoulder Arthroplasty on 09/26. She was discharged home. As she lives alone she was unable to take care of herself and fell in the [place]. She was found by her family . She was admitted to the hospital on 09/29. She was found to have High CK peaking at more then 1000 . She was treated with IV fluids. She also had elevated Troponins thought to be due to Rhabdomyolysis. She is now discharged to the SNF for therapy. Patient is doing well in facility. She does not want to take Dilaudid for  pain control and wants to know if she can take  Also was c/o Mild SOB and cough. No Fever or chills. Her appetite is still not good. She says she does not like food. Patient lives by herself. She has had multiple falls in past few years .   Past Medical History:  Diagnosis Date  . Anxiety   . Arthritis   . Constipation due to pain medication   . COPD (chronic obstructive pulmonary disease) ([place])   . Depression   . Diverticulosis   . Fibromyalgia    Patient does think so,  [facility] dr said I do  . Gastritis   . GERD (gastroesophageal reflux disease)   . Head injury   . Hypertension   . Hypothyroid   . Hypothyroidism   . Osteoarthritis   . Peripheral vascular disease ([place])    Bilateral subclavian artery disease (status post angioplasty). Also status post left CEA. Status post celiac artery PTA.  Marland Kitchen Pneumonia   . PONV (postoperative nausea and vomiting)    severe n/v after every surgery- DId not get sick after surgery 07/2015  . Shortness of breath dyspnea   . Stroke [facility]) 02/2013   Archie Endo 03/12/2013; memory - at times  . Urgency incontinence    Past Surgical History:  Procedure Laterality Date  . ANTERIOR AND POSTERIOR REPAIR  12/2000   Ar[facility] Endo 07/04/2010  . ANTERIOR CERVICAL DECOMP/DISCECTOMY FUSION N/A 04/03/2012   Procedure: ANTERIOR CERVICAL DECOMPRESSION/DISCECTOMY FUSION 2 LEVELS;  Surgeon: Hosie Spangle,  MD;  Location: [facility]URO ORS;  Service: Neurosurgery;  Laterality: N/A;  Cervical four-five,Cervical five-six anterior cervical decompression with fusion plating and bonegraft  . BACK SURGERY    . BREAST SURGERY Right 02/2007   Archie Endo 06/23/2010  . BUNIONECTOMY WITH HAMMERTOE RECONSTRUCTION Left 09/10/2007   Archie Endo 06/23/2010  . CAROTID STENT Left   . CATARACT EXTRACTION W/ INTRAOCULAR LENS IMPLANT Left    Archie Endo 06/23/2010  . CHOLECYSTECTOMY    . COLONOSCOPY N/A 07/15/2014   Procedure: COLONOSCOPY;  Surgeon: Rogene Houston, MD;  [facility]: [facility];  Service: Endoscopy;   Laterality: N/A;  200 - moved to 2:10 - Ann to notify pt  . COMPRESSION HIP SCREW Right 08/01/2015   Procedure: COMPRESSION HIP;  Surgeon: Carole Civil, MD;  Location: [facility];  Service: Orthopedics;  Laterality: Right;  . ESOPHAGOGASTRODUODENOSCOPY N/A 07/15/2014   Procedure: ESOPHAGOGASTRODUODENOSCOPY (EGD);  Surgeon: Rogene Houston, MD;  Location[facility];  Service: Endoscopy;  Laterality: N/A;  . EYE SURGERY     cataract /lens both eyes  . FRACTURE SURGERY    . INCONTINENCE SURGERY  12/2000   Tension-free transvaginal tape procedure/notes 07/04/2010  . JOINT REPLACEMENT    . LUMBAR FUSION  08/2006   Archie Endo 06/23/2010  . PERIPHERAL VASCULAR CATHETERIZATION N/A 10/08/2014   Procedure: Visceral Angiography;  Surgeon: Algernon Huxley, MD;  Location: [facility] CV LAB;  Service: Cardiovascular;  - long stenosis/occlusion of SMA. 75% celiac artery (PTA with 6 mm balloon)  . PERIPHERAL VASCULAR CATHETERIZATION N/A 10/08/2014   Procedure: Visceral Artery Intervention;  Surgeon: Algernon Huxley, MD;  Location: [facility] CV LAB;  Service: Cardiovascular;  Laterality: N/A;  . ROTATOR CUFF REPAIR Left X 3  . TOTAL KNEE ARTHROPLASTY Bilateral 2005; 2011   right; left  . TOTAL SHOULDER ARTHROPLASTY Left 11/15/2016  . TOTAL SHOULDER ARTHROPLASTY Left 11/15/2016   Procedure: LEFT TOTAL SHOULDER ARTHROPLASTY;  Surgeon: [name], MD;  Location: [facility];  Service: Orthopedics;  Laterality: Left;  Marland Kitchen VAGINAL HYSTERECTOMY  12/2000   Archie Endo 07/04/2010  . VASCULAR SURGERY      reports that she has been smoking Cigarettes.  She has a 7.56 pack-year smoking history. She has never used smokeless tobacco. She reports that she does not drink alcohol or use drugs. Social History   Social History  . Marital status: Widowed    Spouse name: N/A  . Number of children: 3  . Years of education: 12   Occupational History  . 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   Social History Main  Topics  . Smoking status: Light Tobacco Smoker    Packs/day: 0.12    Years: 63.00    Types: Cigarettes  . Smokeless tobacco: Never Used  . Alcohol use No  . Drug use: No  . Sexual activity: No   Other Topics Concern  . Not on file   Social History Narrative    She is widowed. She is the mother 45 grandchildren. She lives alone. She works as a Electrical engineer for [employer]   . She exercises at the [facility] pole 45 at 60 minutes of time 3 days a week.    Functional Status Survey:    Family History  Problem Relation Age of Onset  . Pancreatic cancer Mother   . Prostate cancer Father     Health Maintenance  Topic Date Due  . TETANUS/TDAP  10/11/1956  . PNA vac  Low Risk Adult (2 of 2 - PCV13) 03/06/2014  . INFLUENZA VACCINE  09/20/2016  . DEXA SCAN  Completed    Allergies  Allergen Reactions  . Aleve [Naproxen Sodium] Hives and Palpitations  . Hydrocodone Nausea Only  . Statins Other (See Comments)    Muscle pain  . Gluten Meal   . Wheat Bran     Gluten intolerant  . Celebrex [Celecoxib] Nausea And Vomiting  . Codeine Nausea And Vomiting  . Morphine And Related Nausea And Vomiting    Allergies as of 11/21/2016      Reactions   Aleve [naproxen Sodium] Hives, Palpitations   Hydrocodone Nausea Only   Statins Other (See Comments)   Muscle pain   Gluten Meal    Wheat Bran    Gluten intolerant   Celebrex [celecoxib] Nausea And Vomiting   Codeine Nausea And Vomiting   Morphine And Related Nausea And Vomiting      Medication List       Accurate as of 11/21/16 11:03 AM. Always use your most recent med list.          aspirin 325 MG EC tablet Take 1 tablet (325 mg total) by mouth daily with breakfast.   calcium carbonate 500 MG chewable tablet Commonly known as:  TUMS - dosed in mg elemental calcium Chew 1 tablet by mouth daily.   diltiazem 120 MG 24 hr capsule Commonly known as:  DILACOR XR Take 120 mg by mouth every evening.   diphenhydrAMINE 25 MG  tablet Commonly known as:  SOMINEX Take 50 mg by mouth at bedtime.   DULoxetine 60 MG capsule Commonly known as:  CYMBALTA Take 60 mg by mouth daily.   HYDROmorphone 2 MG tablet Commonly known as:  DILAUDID Take 1 tablet (2 mg total) by mouth every 4 (four) hours as needed for severe pain.   levothyroxine 88 MCG tablet Commonly known as:  SYNTHROID, LEVOTHROID Take 1 tablet (88 mcg total) by mouth daily.   multivitamin with minerals Tabs tablet Take 1 tablet by mouth daily.   MYRBETRIQ 50 MG Tb24 tablet Generic drug:  mirabegron ER Take 50 mg by mouth daily.   ondansetron 4 MG tablet Commonly known as:  ZOFRAN Take 1 tablet (4 mg total) by mouth every 8 (eight) hours as needed for nausea or vomiting.   polyethylene glycol packet Commonly known as:  MIRALAX / GLYCOLAX Take 17 g by mouth 2 (two) times daily.   vitamin C 1000 MG tablet Take 1,000 mg by mouth 2 (two) times daily.       Review of Systems  Constitutional: Negative.   HENT: Negative.   Respiratory: Positive for cough and shortness of breath.   Cardiovascular: Negative.   Gastrointestinal: Positive for constipation.  Genitourinary: Negative.   Musculoskeletal: Positive for arthralgias and myalgias.  Neurological: Negative.   Psychiatric/Behavioral: Negative.   All other systems reviewed and are negative.   Vitals:   11/21/16 0931  BP: (!) 145/59  Pulse: 63  Resp: 16  Temp: 98.3 F (36.8 C)  TempSrc: Oral   There is no height or weight on file to calculate BMI. Physical Exam  Constitutional: She is oriented to person, place, and time. She appears well-developed.  HENT:  Head: Normocephalic.  Mouth/Throat: Oropharynx is clear and moist.  Eyes: Pupils are equal, round, and reactive to light.  Neck: Neck supple.  Cardiovascular: Normal rate, regular rhythm and normal heart sounds.   No murmur heard. Pulmonary/Chest: Effort normal. She has  wheezes.  Patient has Bilateral Expiratory Wheezing    Abdominal: Soft. Bowel sounds are normal. She exhibits no distension. There is no tenderness. There is no rebound.  Musculoskeletal: She exhibits no edema.  Neurological: She is alert and oriented to person, place, and time.  No Focal deficits  Skin: Skin is warm and dry.  Psychiatric: She has a normal mood and affect. Her behavior is normal. Thought content normal.    Labs reviewed: Basic Metabolic Panel:  Recent Labs  08/07/16 1051  11/18/16 1809 11/19/16 0636 11/20/16 0657  NA 132*  < > 133* 131* 132*  K 4.5  < > 4.1 3.7 3.8  CL 93*  < > 93* 96* 99*  CO2 30  < > 31 27 26   GLUCOSE 114*  < > 148* 86 134*  BUN 14  < > 26* 17 9  CREATININE 0.59  < > 0.62 0.45 0.39*  CALCIUM 9.1  < > 8.4* 7.6* 7.7*  MG 1.7  --   --  1.6* 1.7  PHOS 4.2  --   --   --   --   < > = values in this interval not displayed. Liver Function Tests:  Recent Labs  08/07/16 1051 11/18/16 1809  AST 27 56*  ALT 16 25  ALKPHOS 103 44  BILITOT 0.8 0.8  PROT 6.7 6.2*  ALBUMIN 3.9 3.6   No results for input(s): LIPASE, AMYLASE in the last 8760 hours. No results for input(s): AMMONIA in the last 8760 hours. CBC:  Recent Labs  11/18/16 1809 11/19/16 0636 11/20/16 0657  WBC 9.4 7.4 7.9  NEUTROABS 7.9*  --   --   HGB 10.1* 9.5* 9.9*  HCT 29.5* 27.7* 29.4*  MCV 93.1 92.0 94.2  PLT 265 254 310   Cardiac Enzymes:  Recent Labs  11/19/16 0019 11/19/16 0636 11/19/16 1240 11/20/16 0657  CKTOTAL 824* 536*  --  357*  TROPONINI 0.05* 0.05* 0.03*  --    BNP: Invalid input(s): POCBNP No results found for: HGBA1C Lab Results  Component Value Date   TSH 1.238 07/31/2015   Lab Results  Component Value Date   WERXVQMG86 761 09/10/2014   Lab Results  Component Value Date   FOLATE >20.0 09/10/2014   Lab Results  Component Value Date   IRON 17 (L) 09/10/2014   TIBC 508 (H) 09/10/2014   FERRITIN 15 05/13/2007    Imaging and Procedures obtained prior to SNF admission: Dg Chest 1  View  Result Date: 11/18/2016 CLINICAL DATA:  [place] onto the bathroom floor today. EXAM: CHEST 1 VIEW COMPARISON:  Left shoulder radiographs obtained today. Chest and right rib radiographs dated 07/18/2016. FINDINGS: Normal sized heart. Aortic calcifications. Clear lungs. Old, healed right fifth lateral rib fracture. The previously demonstrated acute right posterior fifth rib fracture is not currently visualized. Probable callus formation associated with an old left posterior tenth rib fracture. Superior migration of the right humeral head with extensive spur formation. IMPRESSION: 1. No acute abnormality. 2. Marked right glenohumeral joint degenerative changes with evidence of a large, chronic right rotator cuff tear. Electronically Signed   By: Claudie Revering M.D.   On: 11/18/2016 19:24   Ct Head Wo Contrast  Result Date: 11/18/2016 CLINICAL DATA:  Forehead hematoma after falling and hitting her head on the bathroom floor today. EXAM: CT HEAD WITHOUT CONTRAST TECHNIQUE: Contiguous axial images were obtained from the base of the skull through the vertex without intravenous contrast. COMPARISON:  02/28/2013. FINDINGS: Brain: Diffusely enlarged  ventricles and subarachnoid spaces. Patchy white matter low density in both cerebral hemispheres. No intracranial hemorrhage, mass lesion or CT evidence of acute infarction. Vascular: No hyperdense vessel or unexpected calcification. Skull: Normal. Negative for fracture or focal lesion. Sinuses/Orbits: Status post bilateral cataract extraction. A small metallic structure is again demonstrated at the superior medial aspect of the left globe. Normally pneumatized paranasal sinuses. Other: None. IMPRESSION: 1. No acute abnormality. 2. Mildly progressive diffuse cerebral and cerebellar atrophy. 3. Marked diffuse chronic small vessel white matter ischemic changes in both cerebral hemispheres and in the cerebellum, with progression. 4. Stable small metallic foreign body in the  superior medial margin of the left globe. This could represent a surgically placed object or a foreign body. This would preclude MRI unless this can be proven MR safe. Electronically Signed   By: Claudie Revering M.D.   On: 11/18/2016 19:17   Dg Shoulder Left  Result Date: 11/18/2016 CLINICAL DATA:  Left shoulder pain following a fall onto the bathroom floor today. EXAM: LEFT SHOULDER - 2+ VIEW COMPARISON:  11/15/2016. FINDINGS: Stable left shoulder prosthesis. No fracture or dislocation seen. Cervical spine fixation hardware. IMPRESSION: No fracture or dislocation. Electronically Signed   By: Claudie Revering M.D.   On: 11/18/2016 19:18   Dg Hip Unilat With Pelvis 2-3 Views Left  Result Date: 11/18/2016 CLINICAL DATA:  Left hip pain following a fall onto the bathroom floor today. EXAM: DG HIP (WITH OR WITHOUT PELVIS) 2-3V LEFT COMPARISON:  None. FINDINGS: Diffuse osteopenia. Hardware fixation of the proximal right femur. No fracture or dislocation seen. Lower lumbar spine degenerative changes. Atheromatous arterial calcifications. IMPRESSION: No fracture or dislocation. Electronically Signed   By: Claudie Revering M.D.   On: 11/18/2016 19:19    Assessment/Plan  Fall Patient has h/o recurrent Falls. But this time it seems it was due to her being Dehydrated. She is doing well. Will start the therapy. Patient lives by herself and is admant to go home.  Status post Lefttotal shoulder arthroplasty Doing well. Will discontinue Dilaudid as she is getting sleepy with it. She wants to try Advil for few days. Follow up with Ortho.  COPD Will start  her on Duo Nebs. POX  Patient has Quit Smoking now. She follows with Dr Luan Pulling.  Essential hypertension BP slightly elevated On Diltiazem  Anemia She has positive iron defi in past. Will repeat Iron studies.  Hypothyroid Repeat TSH     Family/ staff Communication:   Labs/tests ordered: CBC, BMP, Iron Studies, TSH  Total time spent in this patient  care encounter was 45_ minutes; greater than 50% of the visit spent counseling patient, reviewing records , Labs and coordinating care for problems addressed at this encounter.

## 2016-11-23 ENCOUNTER — Encounter: Payer: Self-pay | Admitting: Internal Medicine

## 2016-11-23 NOTE — Progress Notes (Signed)
Location:   [place] Room Number: 152/D Place of Service:  SNF (31)  Provider: Veleta Miners  PCP: Sinda Du, MD Patient Care Team: Sinda Du, MD as PCP - General (Internal Medicine) [name], MD as Consulting Physician (Orthopedic Surgery)  Extended Emergency Contact Information Primary Emergency Contact: [place], [place] [address] Johnnette Litter of [place] Phone: [phone] Mobile Phone: [phone] Relation: Son Secondary Emergency Contact: Charlynne Cousins, [place] of [place] Phone: [phone] Mobile Phone: [phone] Relation: Son  Code Status: Full Code Goals of care:  Advanced Directive information Advanced Directives 11/23/2016  Does Patient Have a Medical Advance Directive? No  Type of Advance Directive (No Data)  Does patient want to make changes to medical advance directive? No - Patient declined  Copy of [place] in Chart? -  Would patient like information on creating a medical advance directive? No - Patient declined  Pre-existing out of facility DNR order (yellow form or pink MOST form) -     Allergies  Allergen Reactions  . Aleve [Naproxen Sodium] Hives and Palpitations  . Hydrocodone Nausea Only  . Statins Other (See Comments)    Muscle pain  . Gluten Meal   . Wheat Bran     Gluten intolerant  . Celebrex [Celecoxib] Nausea And Vomiting  . Codeine Nausea And Vomiting  . Morphine And Related Nausea And Vomiting    Chief Complaint  Patient presents with  . Discharge Note    Discharge Visit    HPI:  81 y.o. female      Past Medical History:  Diagnosis Date  . Anxiety   . Arthritis   . Constipation due to pain medication   . COPD (chronic obstructive pulmonary disease) ([place])   . Depression   . Diverticulosis   . Fibromyalgia    Patient does think so,  [facility] dr said I do  . Gastritis   . GERD (gastroesophageal reflux  disease)   . Head injury   . Hypertension   . Hypothyroid   . Hypothyroidism   . Osteoarthritis   . Peripheral vascular disease ([place])    Bilateral subclavian artery disease (status post angioplasty). Also status post left CEA. Status post celiac artery PTA.  Marland Kitchen Pneumonia   . PONV (postoperative nausea and vomiting)    severe n/v after every surgery- DId not get sick after surgery 07/2015  . Shortness of breath dyspnea   . Stroke [facility]) 02/2013   Archie Endo 03/12/2013; memory - at times  . Urgency incontinence     Past Surgical History:  Procedure Laterality Date  . ANTERIOR AND POSTERIOR REPAIR  12/2000   Archie Endo 07/04/2010  . ANTERIOR CERVICAL DECOMP/DISCECTOMY FUSION N/A 04/03/2012   Procedure: ANTERIOR CERVICAL DECOMPRESSION/DISCECTOMY FUSION 2 LEVELS;  Surgeon: Hosie Spangle, MD;  Location: [facility];  Service: Neurosurgery;  Laterality: N/A;  Cervical four-five,Cervical five-six anterior cervical decompression with fusion plating and bonegraft  . BACK SURGERY    . BREAST SURGERY Right 02/2007   Archie Endo 06/23/2010  . BUNIONECTOMY WITH HAMMERTOE RECONSTRUCTION Left 09/10/2007   Archie Endo 06/23/2010  . CAROTID STENT Left   . CATARACT EXTRACTION W/ INTRAOCULAR LENS IMPLANT Left    Archie Endo 06/23/2010  . CHOLECYSTECTOMY    . COLONOSCOPY N/A 07/15/2014   Procedure: COLONOSCOPY;  Surgeon: Rogene Houston, MD;  [facility];  Service: Endoscopy;  Laterality: N/A;  200 - moved to 2:10 - Ann to notify pt  . COMPRESSION HIP SCREW Right 08/01/2015   Procedure: COMPRESSION HIP;  Surgeon: Carole Civil, MD;  Location: [facility] OR[facility];  Service: Orthopedics;  Laterality: Right;  . ESOPHAGOGASTRODUODENOSCOPY N/A 07/15/2014   Procedure: ESOPHAGOGASTRODUODENOSCOPY (EGD);  Surgeon: Rogene Houston, MD;  [facility];  Service: Endoscopy;  Laterality: N/A;  . EYE SURGERY     cataract /lens both eyes  . FRACTURE SURGERY    . INCONTINENCE SURGERY  12/2000   Tension-free transvaginal tape  procedure/notes 07/04/2010  . JOINT REPLACEMENT    . LUMBAR FUSION  08/2006   Archie Endo 06/23/2010  . PERIPHERAL VASCULAR CATHETERIZATION N/A 10/08/2014   Procedure: Visceral Angiography;  Surgeon: Algernon Huxley, MD;  Location: [place] CV LAB;  Service: Cardiovascular;  - long stenosis/occlusion of SMA. 75% celiac artery (PTA with 6 mm balloon)  . PERIPHERAL VASCULAR CATHETERIZATION N/A 10/08/2014   Procedure: Visceral Artery Intervention;  Surgeon: Algernon Huxley, MD;  Location: [place] CV LAB;  Service: Cardiovascular;  Laterality: N/A;  . ROTATOR CUFF REPAIR Left X 3  . TOTAL KNEE ARTHROPLASTY Bilateral 2005; 2011   right; left  . TOTAL SHOULDER ARTHROPLASTY Left 11/15/2016  . TOTAL SHOULDER ARTHROPLASTY Left 11/15/2016   Procedure: LEFT TOTAL SHOULDER ARTHROPLASTY;  Surgeon: [name], MD;  Location: [place];  Service: Orthopedics;  Laterality: Left;  Marland Kitchen VAGINAL HYSTERECTOMY  12/2000   Archie Endo 07/04/2010  . VASCULAR SURGERY        reports that she has been smoking Cigarettes.  She has a 7.56 pack-year smoking history. She has never used smokeless tobacco. She reports that she does not drink alcohol or use drugs. Social History   Social History  . Marital status: Widowed    Spouse name: N/A  . Number of children: 3  . Years of education: 12   Occupational History  . 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   Social History Main Topics  . Smoking status: Light Tobacco Smoker    Packs/day: 0.12    Years: 63.00    Types: Cigarettes  . Smokeless tobacco: Never Used  . Alcohol use No  . Drug use: No  . Sexual activity: No   Other Topics Concern  . Not on file   Social History Narrative    She is widowed. She is the mother 42 grandchildren. She lives alone. She works as a Electrical engineer for [employer]   . She exercises at the [facility] pole [facility] at 60 minutes of time 3 days a week.   Functional Status Survey:    Allergies  Allergen Reactions  . Aleve  [Naproxen Sodium] Hives and Palpitations  . Hydrocodone Nausea Only  . Statins Other (See Comments)    Muscle pain  . Gluten Meal   . Wheat Bran     Gluten intolerant  . Celebrex [Celecoxib] Nausea And Vomiting  . Codeine Nausea And Vomiting  . Morphine And Related Nausea And Vomiting    Pertinent  Health Maintenance Due  Topic Date Due  . PNA vac Low Risk Adult (2 of 2 - PCV13) 03/06/2014  . INFLUENZA VACCINE  09/20/2016  . DEXA SCAN  Completed    Medications: Outpatient Encounter Prescriptions as of 11/23/2016  Medication Sig  . Ascorbic Acid (VITAMIN C) 1000 MG tablet Take 1,000 mg by mouth 2 (two) times daily.   Marland Kitchen aspirin EC 325  MG EC tablet Take 1 tablet (325 mg total) by mouth daily with breakfast.  . calcium carbonate (TUMS - DOSED IN MG ELEMENTAL CALCIUM) 500 MG chewable tablet Chew 1 tablet by mouth daily.  Marland Kitchen diltiazem (DILACOR XR) 120 MG 24 hr capsule Take 120 mg by mouth every evening.  . diphenhydrAMINE (SOMINEX) 25 MG tablet Take 50 mg by mouth at bedtime.   . DULoxetine (CYMBALTA) 60 MG capsule Take 60 mg by mouth daily.  Marland Kitchen ibuprofen (ADVIL,MOTRIN) 200 MG tablet Take 200 mg by mouth daily.  . Ipratropium-Albuterol (ALBUTEROL-IPRATROPIUM IN) Inhale 1 puff into the lungs 2 (two) times daily.  Marland Kitchen levothyroxine (SYNTHROID, LEVOTHROID) 88 MCG tablet Take 1 tablet (88 mcg total) by mouth daily.  . mirabegron ER (MYRBETRIQ) 50 MG TB24 tablet Take 50 mg by mouth daily.  . Multiple Vitamin (MULTIVITAMIN WITH MINERALS) TABS Take 1 tablet by mouth daily.  . ondansetron (ZOFRAN) 4 MG tablet Take 1 tablet (4 mg total) by mouth every 8 (eight) hours as needed for nausea or vomiting.  . polyethylene glycol (MIRALAX / GLYCOLAX) packet Take 17 g by mouth 2 (two) times daily.  . [DISCONTINUED] HYDROmorphone (DILAUDID) 2 MG tablet Take 1 tablet (2 mg total) by mouth every 4 (four) hours as needed for severe pain.   No facility-administered encounter medications on file as of 11/23/2016.       Review of Systems  There were no vitals filed for this visit. There is no height or weight on file to calculate BMI. Physical Exam  Labs reviewed: Basic Metabolic Panel:  Recent Labs  08/07/16 1051  11/18/16 1809 11/19/16 0636 11/20/16 0657  NA 132*  < > 133* 131* 132*  K 4.5  < > 4.1 3.7 3.8  CL 93*  < > 93* 96* 99*  CO2 30  < > 31 27 26   GLUCOSE 114*  < > 148* 86 134*  BUN 14  < > 26* 17 9  CREATININE 0.59  < > 0.62 0.45 0.39*  CALCIUM 9.1  < > 8.4* 7.6* 7.7*  MG 1.7  --   --  1.6* 1.7  PHOS 4.2  --   --   --   --   < > = values in this interval not displayed. Liver Function Tests:  Recent Labs  08/07/16 1051 11/18/16 1809  AST 27 56*  ALT 16 25  ALKPHOS 103 44  BILITOT 0.8 0.8  PROT 6.7 6.2*  ALBUMIN 3.9 3.6   No results for input(s): LIPASE, AMYLASE in the last 8760 hours. No results for input(s): AMMONIA in the last 8760 hours. CBC:  Recent Labs  11/18/16 1809 11/19/16 0636 11/20/16 0657  WBC 9.4 7.4 7.9  NEUTROABS 7.9*  --   --   HGB 10.1* 9.5* 9.9*  HCT 29.5* 27.7* 29.4*  MCV 93.1 92.0 94.2  PLT 265 254 310   Cardiac Enzymes:  Recent Labs  11/19/16 0019 11/19/16 0636 11/19/16 1240 11/20/16 0657  CKTOTAL 824* 536*  --  357*  TROPONINI 0.05* 0.05* 0.03*  --    BNP: Invalid input(s): POCBNP CBG: No results for input(s): GLUCAP in the last 8760 hours.  Procedures and Imaging Studies During Stay: Dg Chest 1 View  Result Date: 11/18/2016 CLINICAL DATA:  [place] onto the bathroom floor today. EXAM: CHEST 1 VIEW COMPARISON:  Left shoulder radiographs obtained today. Chest and right rib radiographs dated 07/18/2016. FINDINGS: Normal sized heart. Aortic calcifications. Clear lungs. Old, healed right fifth lateral rib fracture. The  previously demonstrated acute right posterior fifth rib fracture is not currently visualized. Probable callus formation associated with an old left posterior tenth rib fracture. Superior migration of the right  humeral head with extensive spur formation. IMPRESSION: 1. No acute abnormality. 2. Marked right glenohumeral joint degenerative changes with evidence of a large, chronic right rotator cuff tear. Electronically Signed   By: Claudie Revering M.D.   On: 11/18/2016 19:24   Ct Head Wo Contrast  Result Date: 11/18/2016 CLINICAL DATA:  Forehead hematoma after falling and hitting her head on the bathroom floor today. EXAM: CT HEAD WITHOUT CONTRAST TECHNIQUE: Contiguous axial images were obtained from the base of the skull through the vertex without intravenous contrast. COMPARISON:  02/28/2013. FINDINGS: Brain: Diffusely enlarged ventricles and subarachnoid spaces. Patchy white matter low density in both cerebral hemispheres. No intracranial hemorrhage, mass lesion or CT evidence of acute infarction. Vascular: No hyperdense vessel or unexpected calcification. Skull: Normal. Negative for fracture or focal lesion. Sinuses/Orbits: Status post bilateral cataract extraction. A small metallic structure is again demonstrated at the superior medial aspect of the left globe. Normally pneumatized paranasal sinuses. Other: None. IMPRESSION: 1. No acute abnormality. 2. Mildly progressive diffuse cerebral and cerebellar atrophy. 3. Marked diffuse chronic small vessel white matter ischemic changes in both cerebral hemispheres and in the cerebellum, with progression. 4. Stable small metallic foreign body in the superior medial margin of the left globe. This could represent a surgically placed object or a foreign body. This would preclude MRI unless this can be proven MR safe. Electronically Signed   By: Claudie Revering M.D.   On: 11/18/2016 19:17   US Carotid Duplex Bilateral  Result Date: 11/13/2016 CLINICAL DATA:  81 year old female with a history of carotid disease. Cardiovascular risk factors include hypertension, known prior stroke/ TIA, known coronary disease, known vascular disease with prior carotid stenting, hyperlipidemia,  tobacco use EXAM: BILATERAL CAROTID DUPLEX ULTRASOUND TECHNIQUE: Pearline Cables scale imaging, color Doppler and duplex ultrasound were performed of bilateral carotid and vertebral arteries in the neck. COMPARISON:  Duplex 10/22/2010 FINDINGS: Criteria: Quantification of carotid stenosis is based on velocity parameters that correlate the residual internal carotid diameter with NASCET-based stenosis levels, using the diameter of the distal internal carotid lumen as the denominator for stenosis measurement. The following velocity measurements were obtained: RIGHT ICA:  Systolic 785 cm/sec, Diastolic 26 cm/sec CCA:  885 cm/sec SYSTOLIC ICA/CCA RATIO:  1.1 ECA:  90 cm/sec LEFT ICA:  Systolic 027 cm/sec, Diastolic 28 cm/sec CCA:  98 cm/sec SYSTOLIC ICA/CCA RATIO:  1.7 ECA:  295 cm/sec Right Brachial SBP: Not acquired Left Brachial SBP: Not acquired RIGHT CAROTID ARTERY: Calcifications of the right common carotid artery. Intermediate waveform maintained. Heterogeneous and partially calcified plaque at the right carotid bifurcation. No significant lumen shadowing. Low resistance waveform of the right ICA. Mild tortuosity RIGHT VERTEBRAL ARTERY: Antegrade flow with low resistance waveform. LEFT CAROTID ARTERY: No evidence of stenting of the left carotid system. Calcification of the left common carotid artery. Intermediate waveform maintained. Heterogeneous and partially calcified plaque at the left carotid bifurcation with lumen shadowing. Low resistance waveform of the left ICA. Mild tortuosity LEFT VERTEBRAL ARTERY:  Antegrade flow with low resistance waveform. IMPRESSION: Right: Color duplex indicates moderate heterogeneous and calcified plaque, with no hemodynamically significant stenosis by duplex criteria in the extracranial cerebrovascular circulation. Left: Color duplex indicates moderate heterogeneous and calcified plaque, with 50%- 69% stenosis by established duplex criteria. Note that the flow velocities of the left ICA  were obtained  from an area distal to the maximum narrowing due to the presence of anterior wall plaque with shadowing and may be underestimating the percentage of ICA stenosis. If there is concern for more precise evaluation of degree of stenosis, consider either formal cerebral angiogram, or alternatively, CT angiogram. Signed, Dulcy Fanny. Earleen Newport, DO [facility] Electronically Signed   By: Corrie Mckusick D.O.   On: 11/13/2016 13:26   Dg Shoulder Left  Result Date: 11/18/2016 CLINICAL DATA:  Left shoulder pain following a fall onto the bathroom floor today. EXAM: LEFT SHOULDER - 2+ VIEW COMPARISON:  11/15/2016. FINDINGS: Stable left shoulder prosthesis. No fracture or dislocation seen. Cervical spine fixation hardware. IMPRESSION: No fracture or dislocation. Electronically Signed   By: Claudie Revering M.D.   On: 11/18/2016 19:18   Dg Shoulder Left Port  Result Date: 11/15/2016 CLINICAL DATA:  Status post left shoulder replaced EXAM: LEFT SHOULDER - 1 VIEW COMPARISON:  None. FINDINGS: Left shoulder replacement is identified without malalignment. Visualized lung fields and left ribs are normal P IMPRESSION: Left shoulder replacement without malalignment. Electronically Signed   By: Abelardo Diesel M.D.   On: 11/15/2016 11:42   Dg Hip Unilat With Pelvis 2-3 Views Left  Result Date: 11/18/2016 CLINICAL DATA:  Left hip pain following a fall onto the bathroom floor today. EXAM: DG HIP (WITH OR WITHOUT PELVIS) 2-3V LEFT COMPARISON:  None. FINDINGS: Diffuse osteopenia. Hardware fixation of the proximal right femur. No fracture or dislocation seen. Lower lumbar spine degenerative changes. Atheromatous arterial calcifications. IMPRESSION: No fracture or dislocation. Electronically Signed   By: Claudie Revering M.D.   On: 11/18/2016 19:19    Assessment/Plan:    Future labs/tests needed:      This encounter was created in error - please disregard.

## 2016-11-24 DIAGNOSIS — M19012 Primary osteoarthritis, left shoulder: Secondary | ICD-10-CM | POA: Diagnosis not present

## 2016-11-27 DIAGNOSIS — Z96612 Presence of left artificial shoulder joint: Secondary | ICD-10-CM | POA: Diagnosis not present

## 2016-11-27 DIAGNOSIS — Z7982 Long term (current) use of aspirin: Secondary | ICD-10-CM | POA: Diagnosis not present

## 2016-11-27 DIAGNOSIS — I739 Peripheral vascular disease, unspecified: Secondary | ICD-10-CM | POA: Diagnosis not present

## 2016-11-27 DIAGNOSIS — M6282 Rhabdomyolysis: Secondary | ICD-10-CM | POA: Diagnosis not present

## 2016-11-27 DIAGNOSIS — Z4781 Encounter for orthopedic aftercare following surgical amputation: Secondary | ICD-10-CM | POA: Diagnosis not present

## 2016-11-27 DIAGNOSIS — J449 Chronic obstructive pulmonary disease, unspecified: Secondary | ICD-10-CM | POA: Diagnosis not present

## 2016-11-27 DIAGNOSIS — I10 Essential (primary) hypertension: Secondary | ICD-10-CM | POA: Diagnosis not present

## 2016-11-27 DIAGNOSIS — K219 Gastro-esophageal reflux disease without esophagitis: Secondary | ICD-10-CM | POA: Diagnosis not present

## 2016-11-27 DIAGNOSIS — F419 Anxiety disorder, unspecified: Secondary | ICD-10-CM | POA: Diagnosis not present

## 2016-11-27 DIAGNOSIS — Z79891 Long term (current) use of opiate analgesic: Secondary | ICD-10-CM | POA: Diagnosis not present

## 2016-11-27 DIAGNOSIS — F329 Major depressive disorder, single episode, unspecified: Secondary | ICD-10-CM | POA: Diagnosis not present

## 2016-11-28 ENCOUNTER — Encounter (HOSPITAL_COMMUNITY)
Admission: RE | Admit: 2016-11-28 | Discharge: 2016-11-28 | Disposition: A | Discharge status: Home / Self Care | Payer: PPO | Source: Skilled Nursing Facility | Attending: Internal Medicine | Admitting: Internal Medicine

## 2016-11-28 ENCOUNTER — Other Ambulatory Visit: Payer: Self-pay | Admitting: *Deleted

## 2016-11-28 DIAGNOSIS — Z7982 Long term (current) use of aspirin: Secondary | ICD-10-CM | POA: Diagnosis not present

## 2016-11-28 DIAGNOSIS — K219 Gastro-esophageal reflux disease without esophagitis: Secondary | ICD-10-CM | POA: Diagnosis not present

## 2016-11-28 DIAGNOSIS — F329 Major depressive disorder, single episode, unspecified: Secondary | ICD-10-CM | POA: Diagnosis not present

## 2016-11-28 DIAGNOSIS — F419 Anxiety disorder, unspecified: Secondary | ICD-10-CM | POA: Diagnosis not present

## 2016-11-28 DIAGNOSIS — J449 Chronic obstructive pulmonary disease, unspecified: Secondary | ICD-10-CM | POA: Diagnosis not present

## 2016-11-28 DIAGNOSIS — Z4781 Encounter for orthopedic aftercare following surgical amputation: Secondary | ICD-10-CM | POA: Diagnosis not present

## 2016-11-28 DIAGNOSIS — I10 Essential (primary) hypertension: Secondary | ICD-10-CM | POA: Diagnosis not present

## 2016-11-28 DIAGNOSIS — I739 Peripheral vascular disease, unspecified: Secondary | ICD-10-CM | POA: Diagnosis not present

## 2016-11-28 DIAGNOSIS — Z96612 Presence of left artificial shoulder joint: Secondary | ICD-10-CM | POA: Diagnosis not present

## 2016-11-28 DIAGNOSIS — Z9181 History of falling: Secondary | ICD-10-CM | POA: Insufficient documentation

## 2016-11-28 DIAGNOSIS — Z4789 Encounter for other orthopedic aftercare: Secondary | ICD-10-CM | POA: Insufficient documentation

## 2016-11-28 DIAGNOSIS — Z79891 Long term (current) use of opiate analgesic: Secondary | ICD-10-CM | POA: Diagnosis not present

## 2016-11-28 DIAGNOSIS — M6282 Rhabdomyolysis: Secondary | ICD-10-CM | POA: Diagnosis not present

## 2016-11-28 DIAGNOSIS — F17200 Nicotine dependence, unspecified, uncomplicated: Secondary | ICD-10-CM | POA: Insufficient documentation

## 2016-11-28 NOTE — Patient Outreach (Signed)
[facility]) Care Management  11/28/2016  [name] 11-17-1935 [id number]   Transition of Care Referral  Referral Date: 11/28/16 Referral Source: HTA Date of Discharge: 11/25/16 Facility: [facility] Discharge Diagnosis: Weakness Insurance: HTA  Outreach attempt to patient. HIPAA verified with patient. Patient has been in the hospital multiple times this year. In addition, she had an ED visit and a rehab stay in 2018. Patient stated, her appetite has been poor, since surgery. Patient is microwaving her meals daily, due to lack of assistance. Patient has a history of CHF, HTN, and CVA. She lives alone, has use of one arm, and her friend attempts to assist patient. Patient reported having more than one fall with injury. She is active with [facility] (PT) and her co-pay is $10 per visit. Patient reported, she quit smoking, which she recently started back about two months ago. [facility] services and benefits explained to patient and she agreed.   Patient reported receiving her discharge paperwork and she understood it. Patient had a primary MD appointment on 11/24/16 after being discharged from the hospital. Patient reported, she is taking her medications as prescribed.   Plan: RN CM advised patient to contact RNCM for any needs or concerns. RN CM advised patient to alert MD for any changes in conditions.  RN CM will send referral to [facility] RN for further in home eval/assessment of care needs and management of chronic conditions. RN CM will send referral to [facility] for transition of care. RN CM advised patient that [facility] RN CM would follow up and contact patient within the next 10 days.   Lake Bells, RN, BSN, MHA/MSL, [place] Telephonic Care Manager Coordinator [facility] Direct Phone: [phone] Toll Free: [phone] Fax: [phone]

## 2016-11-29 ENCOUNTER — Other Ambulatory Visit: Payer: Self-pay | Admitting: *Deleted

## 2016-11-29 NOTE — Patient Outreach (Signed)
[facility]) Care Management  11/29/2016  [name] 03/17/1935 [id number]  Referral received from our telephonic case management team on Mrs. [name] who was referred to [facility] by Dr. Sinda Du. Mrs. [name] was discharged to home from [facility] on 11/25/16. Mrs. [name] has a medical history which includes CHF, HTN, and CVA. She is reported to live alone and history of falls. She is active with [facility].   Plan: I will reach out to Mrs. [name] by phone no later than Thursday 11/30/16.    [place] Management  [phone]

## 2016-11-30 ENCOUNTER — Other Ambulatory Visit: Payer: Self-pay | Admitting: *Deleted

## 2016-11-30 NOTE — Patient Outreach (Signed)
[facility]) Care Management  11/30/2016  ELVI LEVENTHAL December 29, 1935 [id number]  I was unsuccessful in my attempt to reach Mrs. [place]igua by phone today. I received a message stating the number had been disconnected. I was also unable to reach her son, listed as NOK but left a HIPPA compliant message requesting a return call. I left a message at the office of Dr. Sinda Du, also letting him know of my attempts to reach Mrs. Keddy and requesting a return call with any new/different contact information.   Plan: I will attempt to reach Mrs. Klinke again tomorrow.    [facility] Management  [phone]

## 2016-12-01 ENCOUNTER — Other Ambulatory Visit: Payer: Self-pay | Admitting: *Deleted

## 2016-12-01 DIAGNOSIS — J449 Chronic obstructive pulmonary disease, unspecified: Secondary | ICD-10-CM | POA: Diagnosis not present

## 2016-12-01 DIAGNOSIS — I10 Essential (primary) hypertension: Secondary | ICD-10-CM | POA: Diagnosis not present

## 2016-12-01 DIAGNOSIS — K219 Gastro-esophageal reflux disease without esophagitis: Secondary | ICD-10-CM | POA: Diagnosis not present
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
## 2016-12-02 ENCOUNTER — Emergency Department (HOSPITAL_COMMUNITY): Payer: PPO

## 2016-12-02 ENCOUNTER — Inpatient Hospital Stay (HOSPITAL_COMMUNITY)
Admission: EM | Admit: 2016-12-02 | Discharge: 2016-12-05 | DRG: 392 | Disposition: A | Discharge status: Home Health | Payer: PPO | Attending: Pulmonary Disease | Admitting: Pulmonary Disease

## 2016-12-02 ENCOUNTER — Encounter (HOSPITAL_COMMUNITY): Payer: Self-pay | Admitting: Emergency Medicine

## 2016-12-02 DIAGNOSIS — K5909 Other constipation: Secondary | ICD-10-CM | POA: Diagnosis present

## 2016-12-02 DIAGNOSIS — Z7982 Long term (current) use of aspirin: Secondary | ICD-10-CM

## 2016-12-02 DIAGNOSIS — Z9181 History of falling: Secondary | ICD-10-CM

## 2016-12-02 DIAGNOSIS — Z888 Allergy status to other drugs, medicaments and biological substances status: Secondary | ICD-10-CM

## 2016-12-02 DIAGNOSIS — K5289 Other specified noninfective gastroenteritis and colitis: Secondary | ICD-10-CM | POA: Diagnosis not present

## 2016-12-02 DIAGNOSIS — M797 Fibromyalgia: Secondary | ICD-10-CM | POA: Diagnosis not present

## 2016-12-02 DIAGNOSIS — Z885 Allergy status to narcotic agent status: Secondary | ICD-10-CM | POA: Diagnosis not present

## 2016-12-02 DIAGNOSIS — Z8673 Personal history of transient ischemic attack (TIA), and cerebral infarction without residual deficits: Secondary | ICD-10-CM

## 2016-12-02 DIAGNOSIS — E039 Hypothyroidism, unspecified: Secondary | ICD-10-CM | POA: Diagnosis present

## 2016-12-02 DIAGNOSIS — K2900 Acute gastritis without bleeding: Secondary | ICD-10-CM | POA: Diagnosis present

## 2016-12-02 DIAGNOSIS — K219 Gastro-esophageal reflux disease without esophagitis: Secondary | ICD-10-CM | POA: Diagnosis present

## 2016-12-02 DIAGNOSIS — J449 Chronic obstructive pulmonary disease, unspecified: Secondary | ICD-10-CM | POA: Diagnosis present

## 2016-12-02 DIAGNOSIS — Z79899 Other long term (current) drug therapy: Secondary | ICD-10-CM | POA: Diagnosis not present

## 2016-12-02 DIAGNOSIS — I10 Essential (primary) hypertension: Secondary | ICD-10-CM | POA: Diagnosis not present

## 2016-12-02 DIAGNOSIS — M1991 Primary osteoarthritis, unspecified site: Secondary | ICD-10-CM | POA: Diagnosis present

## 2016-12-02 DIAGNOSIS — Z91018 Allergy to other foods: Secondary | ICD-10-CM

## 2016-12-02 DIAGNOSIS — R531 Weakness: Secondary | ICD-10-CM

## 2016-12-02 DIAGNOSIS — R109 Unspecified abdominal pain: Secondary | ICD-10-CM | POA: Diagnosis not present

## 2016-12-02 DIAGNOSIS — K5790 Diverticulosis of intestine, part unspecified, without perforation or abscess without bleeding: Secondary | ICD-10-CM | POA: Diagnosis present

## 2016-12-02 DIAGNOSIS — F419 Anxiety disorder, unspecified: Secondary | ICD-10-CM | POA: Diagnosis present

## 2016-12-02 DIAGNOSIS — Z886 Allergy status to analgesic agent status: Secondary | ICD-10-CM

## 2016-12-02 DIAGNOSIS — F329 Major depressive disorder, single episode, unspecified: Secondary | ICD-10-CM | POA: Diagnosis not present

## 2016-12-02 DIAGNOSIS — K529 Noninfective gastroenteritis and colitis, unspecified: Principal | ICD-10-CM | POA: Diagnosis present

## 2016-12-02 DIAGNOSIS — E860 Dehydration: Secondary | ICD-10-CM | POA: Diagnosis not present

## 2016-12-02 DIAGNOSIS — F17200 Nicotine dependence, unspecified, uncomplicated: Secondary | ICD-10-CM | POA: Diagnosis not present

## 2016-12-02 DIAGNOSIS — E871 Hypo-osmolality and hyponatremia: Secondary | ICD-10-CM | POA: Diagnosis present

## 2016-12-02 DIAGNOSIS — Z791 Long term (current) use of non-steroidal anti-inflammatories (NSAID): Secondary | ICD-10-CM

## 2016-12-02 DIAGNOSIS — N3941 Urge incontinence: Secondary | ICD-10-CM | POA: Diagnosis present

## 2016-12-02 DIAGNOSIS — I739 Peripheral vascular disease, unspecified: Secondary | ICD-10-CM | POA: Diagnosis not present

## 2016-12-02 DIAGNOSIS — Z87891 Personal history of nicotine dependence: Secondary | ICD-10-CM | POA: Diagnosis not present

## 2016-12-02 LAB — LIPASE, BLOOD: Lipase: 36 U/L (ref 11–51)

## 2016-12-02 LAB — URINALYSIS, ROUTINE W REFLEX MICROSCOPIC
Bilirubin Urine: NEGATIVE
Glucose, UA: NEGATIVE mg/dL
Hgb urine dipstick: NEGATIVE
KETONES UR: NEGATIVE mg/dL
Leukocytes, UA: NEGATIVE
Nitrite: NEGATIVE
PROTEIN: 100 mg/dL — AB
Specific Gravity, Urine: 1.03 (ref 1.005–1.030)
pH: 5 (ref 5.0–8.0)

## 2016-12-02 LAB — COMPREHENSIVE METABOLIC PANEL
ALBUMIN: 3.6 g/dL (ref 3.5–5.0)
ALK PHOS: 86 U/L (ref 38–126)
ALT: 17 U/L (ref 14–54)
AST: 25 U/L (ref 15–41)
Anion gap: 11 (ref 5–15)
BUN: 21 mg/dL — ABNORMAL HIGH (ref 6–20)
CALCIUM: 8.4 mg/dL — AB (ref 8.9–10.3)
CO2: 27 mmol/L (ref 22–32)
CREATININE: 0.65 mg/dL (ref 0.44–1.00)
Chloride: 87 mmol/L — ABNORMAL LOW (ref 101–111)
GFR calc Af Amer: >60 mL/min (ref >60)
GFR calc non Af Amer: >60 mL/min (ref >60)
GLUCOSE: 177 mg/dL — AB (ref 65–99)
Potassium: 3.7 mmol/L (ref 3.5–5.1)
SODIUM: 125 mmol/L — AB (ref 135–145)
Total Bilirubin: 0.7 mg/dL (ref 0.3–1.2)
Total Protein: 6.7 g/dL (ref 6.5–8.1)

## 2016-12-02 LAB — CBC WITH DIFFERENTIAL/PLATELET
Basophils Absolute: 0 10*3/uL (ref 0.0–0.1)
Basophils Relative: 0 %
EOS ABS: 0 10*3/uL (ref 0.0–0.7)
Eosinophils Relative: 0 %
HCT: 36.3 % (ref 36.0–46.0)
HEMOGLOBIN: 12.3 g/dL (ref 12.0–15.0)
LYMPHS ABS: 0.5 10*3/uL — AB (ref 0.7–4.0)
Lymphocytes Relative: 4 %
MCH: 31.1 pg (ref 26.0–34.0)
MCHC: 33.9 g/dL (ref 30.0–36.0)
MCV: 91.7 fL (ref 78.0–100.0)
Monocytes Absolute: 0.4 10*3/uL (ref 0.1–1.0)
Monocytes Relative: 3 %
NEUTROS ABS: 11.5 10*3/uL — AB (ref 1.7–7.7)
NEUTROS PCT: 93 %
Platelets: 531 10*3/uL — ABNORMAL HIGH (ref 150–400)
RBC: 3.96 MIL/uL (ref 3.87–5.11)
RDW: 13.5 % (ref 11.5–15.5)
WBC: 12.3 10*3/uL — AB (ref 4.0–10.5)

## 2016-12-02 MED ORDER — ONDANSETRON HCL 4 MG PO TABS: 4.0000 mg | ORAL_TABLET | Freq: Four times a day (QID) | ORAL | Status: DC | PRN

## 2016-12-02 MED ORDER — IOPAMIDOL (ISOVUE-300) INJECTION 61%
100.0000 mL | Freq: Once | INTRAVENOUS | Status: AC | PRN
  Administered 2016-12-02: 100 mL via INTRAVENOUS

## 2016-12-02 MED ORDER — LEVOTHYROXINE SODIUM 88 MCG PO TABS
88.0000 ug | ORAL_TABLET | Freq: Every day | ORAL | Status: DC
  Administered 2016-12-02 – 2016-12-05 (×4): 88 ug via ORAL
  Filled 2016-12-02 (×5): qty 1

## 2016-12-02 MED ORDER — SODIUM CHLORIDE 0.9 % IV BOLUS (SEPSIS)
500.0000 mL | Freq: Once | INTRAVENOUS | Status: AC
  Administered 2016-12-02: 500 mL via INTRAVENOUS

## 2016-12-02 MED ORDER — CIPROFLOXACIN IN D5W 400 MG/200ML IV SOLN
400.0000 mg | Freq: Two times a day (BID) | INTRAVENOUS | Status: DC
Start: 2016-12-03 — End: 2016-12-05
  Administered 2016-12-03 – 2016-12-05 (×5): 400 mg via INTRAVENOUS
  Filled 2016-12-02 (×5): qty 200

## 2016-12-02 MED ORDER — POTASSIUM CHLORIDE IN NACL 20-0.9 MEQ/L-% IV SOLN
INTRAVENOUS | Status: DC
  Administered 2016-12-02 – 2016-12-04 (×4): via INTRAVENOUS

## 2016-12-02 MED ORDER — ACETAMINOPHEN 325 MG PO TABS
650.0000 mg | ORAL_TABLET | Freq: Four times a day (QID) | ORAL | Status: DC | PRN
  Administered 2016-12-02: 650 mg via ORAL
  Filled 2016-12-02: qty 2

## 2016-12-02 MED ORDER — POTASSIUM CHLORIDE 2 MEQ/ML IV SOLN
INTRAVENOUS | Status: DC
  Filled 2016-12-02 (×5): qty 1000

## 2016-12-02 MED ORDER — HYDRALAZINE HCL 20 MG/ML IJ SOLN: 10.0000 mg | Freq: Four times a day (QID) | INTRAMUSCULAR | Status: DC | PRN

## 2016-12-02 MED ORDER — PANTOPRAZOLE SODIUM 40 MG IV SOLR
40.0000 mg | Freq: Once | INTRAVENOUS | Status: AC
  Administered 2016-12-02: 40 mg via INTRAVENOUS
  Filled 2016-12-02: qty 40

## 2016-12-02 MED ORDER — METRONIDAZOLE IN NACL 5-0.79 MG/ML-% IV SOLN
500.0000 mg | Freq: Three times a day (TID) | INTRAVENOUS | Status: DC
  Administered 2016-12-02 – 2016-12-05 (×8): 500 mg via INTRAVENOUS
  Filled 2016-12-02 (×8): qty 100

## 2016-12-02 MED ORDER — ONDANSETRON HCL 4 MG/2ML IJ SOLN
4.0000 mg | Freq: Once | INTRAMUSCULAR | Status: AC
  Administered 2016-12-02: 4 mg via INTRAVENOUS
  Filled 2016-12-02: qty 2

## 2016-12-02 MED ORDER — DILTIAZEM HCL ER COATED BEADS 120 MG PO CP24
120.0000 mg | ORAL_CAPSULE | Freq: Every day | ORAL | Status: DC
  Administered 2016-12-02: 120 mg via ORAL
  Filled 2016-12-02 (×4): qty 1

## 2016-12-02 MED ORDER — FAMOTIDINE IN NACL 20-0.9 MG/50ML-% IV SOLN
20.0000 mg | INTRAVENOUS | Status: AC
  Administered 2016-12-02: 20 mg via INTRAVENOUS
  Filled 2016-12-02: qty 50

## 2016-12-02 MED ORDER — ZOLPIDEM TARTRATE 5 MG PO TABS
5.0000 mg | ORAL_TABLET | Freq: Every day | ORAL | Status: DC
  Administered 2016-12-02 – 2016-12-04 (×3): 5 mg via ORAL
  Filled 2016-12-02 (×3): qty 1

## 2016-12-02 MED ORDER — CIPROFLOXACIN IN D5W 400 MG/200ML IV SOLN
400.0000 mg | Freq: Once | INTRAVENOUS | Status: AC
  Administered 2016-12-02: 400 mg via INTRAVENOUS
  Filled 2016-12-02: qty 200

## 2016-12-02 MED ORDER — HYDRALAZINE HCL 20 MG/ML IJ SOLN
10.0000 mg | Freq: Once | INTRAMUSCULAR | Status: AC
  Administered 2016-12-02: 10 mg via INTRAVENOUS
  Filled 2016-12-02: qty 1

## 2016-12-02 MED ORDER — METRONIDAZOLE 500 MG PO TABS
500.0000 mg | ORAL_TABLET | Freq: Once | ORAL | Status: AC
  Administered 2016-12-02: 500 mg via ORAL
  Filled 2016-12-02: qty 1

## 2016-12-02 MED ORDER — ALBUTEROL SULFATE (2.5 MG/3ML) 0.083% IN NEBU: 3.0000 mL | INHALATION_SOLUTION | RESPIRATORY_TRACT | Status: DC | PRN

## 2016-12-02 MED ORDER — FAMOTIDINE IN NACL 20-0.9 MG/50ML-% IV SOLN
INTRAVENOUS | Status: AC
  Filled 2016-12-02: qty 50

## 2016-12-02 MED ORDER — DIPHENHYDRAMINE HCL (SLEEP) 25 MG PO TABS
50.0000 mg | ORAL_TABLET | Freq: Every day | ORAL | Status: DC
Start: 2016-12-02 — End: 2016-12-02

## 2016-12-02 MED ORDER — VITAMIN C 500 MG PO TABS
1000.0000 mg | ORAL_TABLET | Freq: Two times a day (BID) | ORAL | Status: DC
  Administered 2016-12-02 – 2016-12-05 (×6): 1000 mg via ORAL
  Filled 2016-12-02 (×6): qty 2

## 2016-12-02 MED ORDER — ACETAMINOPHEN 650 MG RE SUPP: 650.0000 mg | Freq: Four times a day (QID) | RECTAL | Status: DC | PRN

## 2016-12-02 MED ORDER — SODIUM CHLORIDE 0.9 % IV SOLN
10.0000 mg | Freq: Two times a day (BID) | INTRAVENOUS | Status: DC
  Administered 2016-12-02: 10 mg via INTRAVENOUS
  Filled 2016-12-02 (×4): qty 1

## 2016-12-02 MED ORDER — TIOTROPIUM BROMIDE MONOHYDRATE 18 MCG IN CAPS
18.0000 ug | ORAL_CAPSULE | Freq: Every day | RESPIRATORY_TRACT | Status: DC
Start: 2016-12-03 — End: 2016-12-05
  Administered 2016-12-03 – 2016-12-05 (×3): 18 ug via RESPIRATORY_TRACT
  Filled 2016-12-02: qty 5

## 2016-12-02 MED ORDER — ASPIRIN EC 325 MG PO TBEC
325.0000 mg | DELAYED_RELEASE_TABLET | Freq: Every day | ORAL | Status: DC
  Administered 2016-12-02 – 2016-12-05 (×4): 325 mg via ORAL
  Filled 2016-12-02 (×4): qty 1

## 2016-12-02 MED ORDER — ADULT MULTIVITAMIN W/MINERALS CH
1.0000 | ORAL_TABLET | Freq: Every day | ORAL | Status: DC
  Administered 2016-12-02 – 2016-12-05 (×4): 1 via ORAL
  Filled 2016-12-02 (×4): qty 1

## 2016-12-02 MED ORDER — DILTIAZEM HCL ER 120 MG PO CP24
120.0000 mg | ORAL_CAPSULE | Freq: Every day | ORAL | Status: DC
  Filled 2016-12-02 (×2): qty 1

## 2016-12-02 MED ORDER — ONDANSETRON HCL 4 MG/2ML IJ SOLN: 4.0000 mg | Freq: Four times a day (QID) | INTRAMUSCULAR | Status: DC | PRN

## 2016-12-02 MED ORDER — DULOXETINE HCL 60 MG PO CPEP
60.0000 mg | ORAL_CAPSULE | Freq: Every day | ORAL | Status: DC
  Administered 2016-12-02 – 2016-12-05 (×4): 60 mg via ORAL
  Filled 2016-12-02 (×4): qty 1

## 2016-12-02 MED ORDER — FENTANYL CITRATE (PF) 100 MCG/2ML IJ SOLN
12.5000 ug | INTRAMUSCULAR | Status: DC | PRN
  Administered 2016-12-02 – 2016-12-03 (×5): 12.5 ug via INTRAVENOUS
  Filled 2016-12-02 (×5): qty 2

## 2016-12-02 MED ORDER — ENOXAPARIN SODIUM 40 MG/0.4ML ~~LOC~~ SOLN
40.0000 mg | SUBCUTANEOUS | Status: DC
  Administered 2016-12-02 – 2016-12-04 (×3): 40 mg via SUBCUTANEOUS
  Filled 2016-12-02 (×3): qty 0.4

## 2016-12-02 MED ORDER — GI COCKTAIL ~~LOC~~
30.0000 mL | Freq: Once | ORAL | Status: AC
  Administered 2016-12-02: 30 mL via ORAL
  Filled 2016-12-02: qty 30

## 2016-12-02 NOTE — H&P (Signed)
TRH H&P   Patient Demographics:    [name], is a 81 y.o. female  MRN: [id number]   DOB - 03/24/1935  Admit Date - 12/02/2016  Outpatient Primary MD for the patient is Sinda Du, MD  Referring MD: Dr. Sabra Heck  Outpatient Specialists: none  Patient coming from: home  Chief Complaint  Patient presents with  . Emesis      HPI:    [name]  is a 81 y.o. female,with past medical history of COPD with ongoing tobacco use, depression, and history of stroke who presented to the ED with diffuse abdominal pain associated with nausea and 2 episodes of vomiting. Symptoms started 2 days back into the had diffuse crampy lower abdominal pain associated with nausea and subjective fevers with chills. She reported generalized weakness and having poor appetite. She also reported having increased belching and gaseous distention. Denied any diarrhea but had soft loose stools today. Denies noticing any blood or dark stools.This afternoon she also started having epigastric burning with some abdominal distention. Patient denies any headache, blurred vision, dizziness, chest pain, palpitation, shortness of breath, dysuria. No sick contact or recent travel. Patient was hospitalized 2 weeks back after recent rotator cuff surgery with a fall and rhabdomyolysis. In the ED patient was afebrile, hypertensive . Blood work showed BC of 12.3 K, platelets of 531, sodium of 125, chloride of 87,normal renal function and glucose 177.LFTs and lipase were normal. CT of the abdomen and pelvis howed possible small bowel enteritis along with segmental colitis involving distal transverse and descending colon. No signs of perforation or abscess.  Patient given 500 mL IV normal saline bolus, IV Cipro and Flagyl, GI cocktail, IV Zofran and Protonix. Patient was able to tolerate some clears in the ED.  Hospitalist called for observation on medical floor.    Review of systems:    In addition to the HPI above,  Subjective fever and chills No Headache, No changes with Vision or hearing, No problems swallowing food or Liquids, No Chest pain, Cough or Shortness of Breath, Abdominal pain with nausea and vomiting, Bowel movements are regular, No Blood in stool or Urine, No dysuria, No new skin rashes or bruises, No new joints pains-aches,  Generalized weakness, tingling, numbness in any extremity, No recent weight gain or loss, No polyuria, polydypsia or polyphagia, No significant Mental Stressors.  A full 10 point Review of Systems was done, except as stated above, all other Review of Systems were negative.   With Past History of the following :    Past Medical History:  Diagnosis Date  . Anxiety   . Arthritis   . Constipation due to pain medication   . COPD (chronic obstructive pulmonary disease) ([place])   . Depression   . Diverticulosis   . Fibromyalgia    Patient does think so,  [facility] dr said I do  .  Gastritis   . GERD (gastroesophageal reflux disease)   . Head injury   . Hypertension   . Hypothyroid   . Hypothyroidism   . Osteoarthritis   . Peripheral vascular disease ([place])    Bilateral subclavian artery disease (status post angioplasty). Also status post left CEA. Status post celiac artery PTA.  Marland Kitchen Pneumonia   . PONV (postoperative nausea and vomiting)    severe n/v after every surgery- DId not get sick after surgery 07/2015  . Shortness of breath dyspnea   . Stroke [facility]) 02/2013   Archie Endo 03/12/2013; memory - at times  . Urgency incontinence       Past Surgical History:  Procedure Laterality Date  . ANTERIOR AND POSTERIOR REPAIR  12/2000   Archie Endo 07/04/2010  . ANTERIOR CERVICAL DECOMP/DISCECTOMY FUSION N/A 04/03/2012   Procedure: ANTERIOR CERVICAL DECOMPRESSION/DISCECTOMY FUSION 2 LEVELS;  Surgeon: Hosie Spangle, MD;  Location: [place] NEURO ORS;  Service:  Neurosurgery;  Laterality: N/A;  Cervical four-five,Cervical five-six anterior cervical decompression with fusion plating and bonegraft  . BACK SURGERY    . BREAST SURGERY Right 02/2007   Archie Endo 06/23/2010  . BUNIONECTOMY WITH HAMMERTOE RECONSTRUCTION Left 09/10/2007   Archie Endo 06/23/2010  . CAROTID STENT Left   . CATARACT EXTRACTION W/ INTRAOCULAR LENS IMPLANT Left    Archie Endo 06/23/2010  . CHOLECYSTECTOMY    . COLONOSCOPY N/A 07/15/2014   Procedure: COLONOSCOPY;  Surgeon: Rogene Houston, MD;  [facility];  Service: Endoscopy;  Laterality: N/A;  200 - moved to 2:10 - Ann to notify pt  . COMPRESSION HIP SCREW Right 08/01/2015   Procedure: COMPRESSION HIP;  Surgeon: Carole Civil, MD;  Location: [facility] ORS;  Service: Orthopedics;  Laterality: Right;  . ESOPHAGOGASTRODUODENOSCOPY N/A 07/15/2014   Procedure: ESOPHAGOGASTRODUODENOSCOPY (EGD);  Surgeon: Rogene Houston, MD;  [facility];  Service: Endoscopy;  Laterality: N/A;  . EYE SURGERY     cataract /lens both eyes  . FRACTURE SURGERY    . INCONTINENCE SURGERY  12/2000   Tension-free transvaginal tape procedure/notes 07/04/2010  . JOINT REPLACEMENT    . LUMBAR FUSION  08/2006   Archie Endo 06/23/2010  . PERIPHERAL VASCULAR CATHETERIZATION N/A 10/08/2014   Procedure: Visceral Angiography;  Surgeon: Algernon Huxley, MD;  Location: [place] CV LAB;  Service: Cardiovascular;  - long stenosis/occlusion of SMA. 75% celiac artery (PTA with 6 mm balloon)  . PERIPHERAL VASCULAR CATHETERIZATION N/A 10/08/2014   Procedure: Visceral Artery Intervention;  Surgeon: Algernon Huxley, MD;  Location: [place] CV LAB;  Service: Cardiovascular;  Laterality: N/A;  . ROTATOR CUFF REPAIR Left X 3  . TOTAL KNEE ARTHROPLASTY Bilateral 2005; 2011   right; left  . TOTAL SHOULDER ARTHROPLASTY Left 11/15/2016  . TOTAL SHOULDER ARTHROPLASTY Left 11/15/2016   Procedure: LEFT TOTAL SHOULDER ARTHROPLASTY;  Surgeon: [name], MD;  Location: [place];   Service: Orthopedics;  Laterality: Left;  Marland Kitchen VAGINAL HYSTERECTOMY  12/2000   Archie Endo 07/04/2010  . VASCULAR SURGERY        Social History:     Social History  Substance Use Topics  . Smoking status: Former Smoker    Packs/day: 0.12    Years: 63.00    Types: Cigarettes    Quit date: 11/11/2016  . Smokeless tobacco: Never Used  . Alcohol use No     Lives - home alone  Mobility - independent     Family History :     Family History  Problem Relation Age  of Onset  . Pancreatic cancer Mother   . Prostate cancer Father       Home Medications:   Prior to Admission medications   Medication Sig Start Date End Date Taking? Authorizing Provider  Ascorbic Acid (VITAMIN C) 1000 MG tablet Take 1,000 mg by mouth 2 (two) times daily.    Yes [provider]  aspirin EC 325 MG EC tablet Take 1 tablet (325 mg total) by mouth daily with breakfast. 08/04/15  Yes Fanta, Tesfaye, MD  diltiazem (DILACOR XR) 120 MG 24 hr capsule Take 120 mg by mouth every evening.   Yes [provider]  diphenhydrAMINE (SOMINEX) 25 MG tablet Take 50 mg by mouth at bedtime.    Yes [provider]  DULoxetine (CYMBALTA) 60 MG capsule Take 60 mg by mouth daily.   Yes [provider]  ibuprofen (ADVIL,MOTRIN) 200 MG tablet Take 200 mg by mouth daily.   Yes [provider]  Ipratropium-Albuterol (ALBUTEROL-IPRATROPIUM IN) Inhale 1 puff into the lungs 2 (two) times daily.   Yes [provider]  levothyroxine (SYNTHROID, LEVOTHROID) 88 MCG tablet Take 1 tablet (88 mcg total) by mouth daily. 03/12/13  Yes Love, Ivan Anchors, PA-C  metroNIDAZOLE (FLAGYL) 500 MG tablet Take 1 tablet by mouth 2 (two) times daily. 12/01/16  Yes [provider]  Multiple Vitamin (MULTIVITAMIN WITH MINERALS) TABS Take 1 tablet by mouth daily.   Yes [provider]  ondansetron (ZOFRAN) 4 MG tablet Take 1 tablet (4 mg total) by mouth every 8 (eight) hours as needed for nausea or  vomiting. 11/15/16  Yes Aundra Dubin, PA-C  polyethylene glycol (MIRALAX / GLYCOLAX) packet Take 17 g by mouth 2 (two) times daily. 11/20/16  Yes Murlean Iba, MD     Allergies:     Allergies  Allergen Reactions  . Aleve [Naproxen Sodium] Hives and Palpitations  . Hydrocodone Nausea Only  . Statins Other (See Comments)    Muscle pain  . Gluten Meal   . Wheat Bran     Gluten intolerant  . Celebrex [Celecoxib] Nausea And Vomiting  . Codeine Nausea And Vomiting  . Morphine And Related Nausea And Vomiting     Physical Exam:   Vitals  Blood pressure (!) 190/73, pulse 82, temperature 97.9 F (36.6 C), temperature source Oral, resp. rate 17, height 5' 1.5" (1.562 m), weight 54.4 kg (120 lb), SpO2 (!) 89 %.      Data Review:    CBC  Recent Labs Lab 12/02/16 1133  WBC 12.3*  HGB 12.3  HCT 36.3  PLT 531*  MCV 91.7  MCH 31.1  MCHC 33.9  RDW 13.5  LYMPHSABS 0.5*  MONOABS 0.4  EOSABS 0.0  BASOSABS 0.0   ------------------------------------------------------------------------------------------------------------------  Chemistries   Recent Labs Lab 12/02/16 1133  NA 125*  K 3.7  CL 87*  CO2 27  GLUCOSE 177*  BUN 21*  CREATININE 0.65  CALCIUM 8.4*  AST 25  ALT 17  ALKPHOS 86  BILITOT 0.7   ------------------------------------------------------------------------------------------------------------------ estimated creatinine clearance is 44.1 mL/min (by C-G formula based on SCr of 0.65 mg/dL). ------------------------------------------------------------------------------------------------------------------ No results for input(s): TSH, T4TOTAL, T3FREE, THYROIDAB in the last 72 hours.  Invalid input(s): FREET3  Coagulation profile No results for input(s): INR, PROTIME in the last 168 hours. ------------------------------------------------------------------------------------------------------------------- No results for input(s): DDIMER in the  last 72 hours. -------------------------------------------------------------------------------------------------------------------  Cardiac Enzymes No results for input(s): CKMB, TROPONINI, MYOGLOBIN in the last 168 hours.  Invalid input(s): CK ------------------------------------------------------------------------------------------------------------------  No results found for: BNP   ---------------------------------------------------------------------------------------------------------------  Urinalysis    Component Value Date/Time   COLORURINE YELLOW 12/02/2016 1352   APPEARANCEUR HAZY (A) 12/02/2016 1352   LABSPEC 1.030 12/02/2016 1352   PHURINE 5.0 12/02/2016 1352   GLUCOSEU NEGATIVE 12/02/2016 1352   HGBUR NEGATIVE 12/02/2016 1352   BILIRUBINUR NEGATIVE 12/02/2016 1352   KETONESUR NEGATIVE 12/02/2016 1352   PROTEINUR 100 (A) 12/02/2016 1352   UROBILINOGEN 0.2 10/22/2014 0019   NITRITE NEGATIVE 12/02/2016 1352   LEUKOCYTESUR NEGATIVE 12/02/2016 1352    ----------------------------------------------------------------------------------------------------------------   Imaging Results:    Ct Abdomen Pelvis W Contrast  Result Date: 12/02/2016 CLINICAL DATA:  Upper abdominal pain radiating into lower back. 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 EXAM: CT ABDOMEN AND PELVIS WITH CONTRAST TECHNIQUE: Multidetector CT imaging of the abdomen and pelvis was performed using the standard protocol following bolus administration of intravenous contrast. CONTRAST:  151mL ISOVUE-300 IOPAMIDOL (ISOVUE-300) INJECTION 61% COMPARISON:  Prior CTA of the abdomen and pelvis on 10/22/2014 FINDINGS: Lower chest: No acute abnormality. Hepatobiliary: No focal liver abnormality is seen. Status post cholecystectomy. No biliary dilatation. Pancreas: Unremarkable. No pancreatic ductal dilatation or surrounding inflammatory changes. Spleen: Normal in size without focal  abnormality. Adrenals/Urinary Tract: Scattered nonobstructing small bilateral renal calculi. Stomach/Bowel: No evidence of bowel obstruction or free intraperitoneal air. There are multiple fluid filled small bowel loops which appear mildly hyperemic and top-normal in size. This is nonspecific but may be secondary to enteritis. There also is suggestion of potentially mild colonic wall thickening beginning at the level of the mid transverse colon and extending into the proximal sigmoid colon. This may also implicate a component of colitis. There is a large amount of fecal material in the rectum. Sigmoid diverticulosis present without evidence of acute diverticulitis. No bowel lesions or evidence of focal abscess. 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 The celiac axis and inferior mesenteric artery remain open. Reproductive: Status post hysterectomy. No adnexal masses. Other: No abdominal wall hernia or abnormality. No abdominopelvic ascites. Musculoskeletal: Stable advanced degenerative disc disease of the lumbar spine with chronic compression of the L1 vertebral body. IMPRESSION: 1. Possible small bowel all enteritis as well as component of potential segmental colitis involving the distal transverse and descending colon. No evidence of bowel perforation or focal abscess. 2. 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 Electronically Signed   By: Aletta Edouard M.D.   On: 12/02/2016 13:34    My personal review of EKG: none  Assessment & Plan:    Principal Problem:   Acute enteritis/colitis Suspect infectious versus inflammatory. doubt ischemic colitis. Place on observation. Clear liquid diet and advance as tolerated. Continue empiric IV ciprofloxacin and Flagyl. IV normal saline with potassium  supplement. Serial abdominal exam. Pain control with when necessary IV fentanyl.  Active Problems: Hyponatremia Suspect prerenal secondary to dehydration. Monitor with IV fluids.    Uncontrolled hypertension Likely contributed both by pain and patient not taking her blood pressure medication for over 2 weeks. Resumed her Cardizem and placed on when necessary IV hydralazine.  Acute gastritis Place on IV Pepcid twice a day. Maalox when necessary.      COPD (chronic obstructive pulmonary disease) ([place]) Resume home inhalers. Ounces on smoking cessation.   Generalized weakness Monitor with hydration and antibiotics.     Dr Luan Pulling will  see patient in am    DVT Prophylaxis : Subcutaneous Lovenox  AM Labs Ordered, also please review Full Orders  Family Communication: Admission, patients condition and plan of care including tests being ordered have been discussed with the patient and her sons at bedside   Code Status full code  Likely DC to  home  Condition : [place] called: none  Admission status: observation  Time spent in minutes : 50   Louellen Molder M.D on 12/02/2016 at 3:45 PM  Between 7am to 7pm - Pager - [phone]. After 7pm go to [email or web address] - password [facility]  [facility] - Office  [phone]

## 2016-12-02 NOTE — ED Notes (Signed)
Pt refusing oxygen at this time.

## 2016-12-02 NOTE — ED Triage Notes (Signed)
Patient c/o upper abd pain that radiates into lower abd. Per patient nausea, vomiting, chills, and body aches. Patient denies any diarrhea or urinary symptoms. Per patient started Thursday as belching.

## 2016-12-02 NOTE — ED Provider Notes (Signed)
[facility] DEPT Provider Note   CSN: [id number] Arrival date & time: 12/02/16  1056     History   Chief Complaint Chief Complaint  Patient presents with  . Emesis    HPI [name] is a 81 y.o. female.  HPI  The patient is a 81 year old female, she has a history of COPD, anxiety, chronic constipation, diverticulosis, gastritis, acid reflux, prior history of stroke from several years ago. She recently underwent a rotator cuff surgery of her shoulder, she was admitted for a fall with some weakness and did well and has been home doing well until 3 days ago when she started to have increased amounts of belching, nausea, feeling fatigued and weak, she has had very little to eat or drink, she has repeatedly had significant amounts of belching and gas. She has continued to have urination and is continued to have stools although they're very small, very hard though now turning green. She is also passing gas as flatulence. She has epigastric pain which is persistent, gradually worsening and associated with mild distention. There is fevers but no chest pain coughing or shortness of breath.  Past Medical History:  Diagnosis Date  . Anxiety   . Arthritis   . Constipation due to pain medication   . COPD (chronic obstructive pulmonary disease) ([place])   . Depression   . Diverticulosis   . Fibromyalgia    Patient does think so,  [facility] dr said I do  . Gastritis   . GERD (gastroesophageal reflux disease)   . Head injury   . Hypertension   . Hypothyroid   . Hypothyroidism   . Osteoarthritis   . Peripheral vascular disease ([place])    Bilateral subclavian artery disease (status post angioplasty). Also status post left CEA. Status post celiac artery PTA.  Marland Kitchen Pneumonia   . PONV (postoperative nausea and vomiting)    severe n/v after every surgery- DId not get sick after surgery 07/2015  . Shortness of breath dyspnea   . Stroke [facility]) 02/2013   Archie Endo 03/12/2013; memory - at times  .  Urgency incontinence     Patient Active Problem List   Diagnosis Date Noted  . Status post total shoulder arthroplasty, left 11/21/2016  . COPD (chronic obstructive pulmonary disease) ([place]) 11/21/2016  . Fall 11/21/2016  . Weakness 11/19/2016  . Rhabdomyolysis 11/19/2016  . DJD of left shoulder 11/15/2016  . Preop cardiovascular exam 11/11/2016  . DOE (dyspnea on exertion) 11/11/2016  . Rotator cuff arthropathy of left shoulder 10/30/2016  . Intertrochanteric fracture of right hip ([place]) 08/01/2015    Class: Acute  . SMA stenosis ([place]) 09/15/2014  . Abdominal pain, right upper quadrant 09/15/2014  . Essential hypertension 09/15/2014  . Depression 09/15/2014  . Hypothyroid 09/15/2014  . GERD without esophagitis 09/15/2014  . Lumbago 11/12/2013  . Stiffness of joint, not elsewhere classified, pelvic region and thigh 11/12/2013  . Muscle weakness (generalized) 11/12/2013  . Lack of coordination 03/21/2013  . Fine motor impairment 03/21/2013  . Left leg weakness 03/17/2013  . Risk for falls 03/17/2013  . Carotid artery disease ([place]) 03/11/2013  . Subclavian artery stenosis ([place]) 03/11/2013  . TIA (transient ischemic attack) 03/11/2013  . Tobacco use disorder 03/11/2013  . CVA (cerebral infarction) 03/05/2013  . Pain in joint, shoulder region 11/01/2010    Past Surgical History:  Procedure Laterality Date  . ANTERIOR AND POSTERIOR REPAIR  12/2000   Archie Endo 07/04/2010  . ANTERIOR CERVICAL DECOMP/DISCECTOMY FUSION N/A 04/03/2012   Procedure:  ANTERIOR CERVICAL DECOMPRESSION/DISCECTOMY FUSION 2 LEVELS;  Surgeon: Hosie Spangle, MD;  Location: [facility] NEURO ORS;  Service: Neurosurgery;  Laterality: N/A;  Cervical four-five,Cervical five-six anterior cervical decompression with fusion plating and bonegraft  . BACK SURGERY    . BREAST SURGERY Right 02/2007   Archie Endo 06/23/2010  . BUNIONECTOMY WITH HAMMERTOE RECONSTRUCTION Left 09/10/2007   Archie Endo 06/23/2010  . CAROTID STENT Left   .  CATARACT EXTRACTION W/ INTRAOCULAR LENS IMPLANT Left    Archie Endo 06/23/2010  . CHOLECYSTECTOMY    . COLONOSCOPY N/A 07/15/2014   Procedure: COLONOSCOPY;  Surgeon: Rogene Houston, MD;  Location: [facility];  Service: Endoscopy;  Laterality: N/A;  200 - moved to 2:10 - Ann to notify pt  . COMPRESSION HIP SCREW Right 08/01/2015   Procedure: COMPRESSION HIP;  Surgeon: Carole Civil, MD;  Location: [facility] ORS;  Service: Orthopedics;  Laterality: Right;  . ESOPHAGOGASTRODUODENOSCOPY N/A 07/15/2014   Procedure: ESOPHAGOGASTRODUODENOSCOPY (EGD);  Surgeon: Rogene Houston, MD;  Location: [facility];  Service: Endoscopy;  Laterality: N/A;  . EYE SURGERY     cataract /lens both eyes  . FRACTURE SURGERY    . INCONTINENCE SURGERY  12/2000   Tension-free transvaginal tape procedure/notes 07/04/2010  . JOINT REPLACEMENT    . LUMBAR FUSION  08/2006   Archie Endo 06/23/2010  . PERIPHERAL VASCULAR CATHETERIZATION N/A 10/08/2014   Procedure: Visceral Angiography;  Surgeon: Algernon Huxley, MD;  Location: [place] [facility] CV LAB;  Service: Cardiovascular;  - long stenosis/occlusion of SMA. 75% celiac artery (PTA with 6 mm balloon)  . PERIPHERAL VASCULAR CATHETERIZATION N/A 10/08/2014   Procedure: Visceral Artery Intervention;  Surgeon: Algernon Huxley, MD;  Location: [place] CV LAB;  Service: Cardiovascular;  Laterality: N/A;  . ROTATOR CUFF REPAIR Left X 3  . TOTAL KNEE ARTHROPLASTY Bilateral 2005; 2011   right; left  . TOTAL SHOULDER ARTHROPLASTY Left 11/15/2016  . TOTAL SHOULDER ARTHROPLASTY Left 11/15/2016   Procedure: LEFT TOTAL SHOULDER ARTHROPLASTY;  Surgeon: [name], MD;  Location: [facility];  Service: Orthopedics;  Laterality: Left;  Marland Kitchen VAGINAL HYSTERECTOMY  12/2000   Archie Endo 07/04/2010  . VASCULAR SURGERY      OB History    No data available       Home Medications    Prior to Admission medications   Medication Sig Start Date End Date Taking? Authorizing Provider  Ascorbic Acid (VITAMIN C) 1000 MG  tablet Take 1,000 mg by mouth 2 (two) times daily.    Yes [provider]  aspirin EC 325 MG EC tablet Take 1 tablet (325 mg total) by mouth daily with breakfast. 08/04/15  Yes Fanta, Tesfaye, MD  diltiazem (DILACOR XR) 120 MG 24 hr capsule Take 120 mg by mouth every evening.   Yes [provider]  diphenhydrAMINE (SOMINEX) 25 MG tablet Take 50 mg by mouth at bedtime.    Yes [provider]  DULoxetine (CYMBALTA) 60 MG capsule Take 60 mg by mouth daily.   Yes [provider]  ibuprofen (ADVIL,MOTRIN) 200 MG tablet Take 200 mg by mouth daily.   Yes [provider]  Ipratropium-Albuterol (ALBUTEROL-IPRATROPIUM IN) Inhale 1 puff into the lungs 2 (two) times daily.   Yes [provider]  levothyroxine (SYNTHROID, LEVOTHROID) 88 MCG tablet Take 1 tablet (88 mcg total) by mouth daily. 03/12/13  Yes Love, Ivan Anchors, PA-C  metroNIDAZOLE (FLAGYL) 500 MG tablet Take 1 tablet by mouth 2 (two) times daily. 12/01/16  Yes [provider]  Multiple Vitamin (MULTIVITAMIN WITH MINERALS) TABS Take 1 tablet by mouth daily.   Yes [provider]  ondansetron (ZOFRAN) 4 MG tablet Take 1 tablet (4 mg total) by mouth every 8 (eight) hours as needed for nausea or vomiting. 11/15/16  Yes Aundra Dubin, PA-C  polyethylene glycol (MIRALAX / GLYCOLAX) packet Take 17 g by mouth 2 (two) times daily. 11/20/16  Yes Murlean Iba, MD    Family History Family History  Problem Relation Age of Onset  . Pancreatic cancer Mother   . Prostate cancer Father     Social History Social History  Substance Use Topics  . Smoking status: Former Smoker    Packs/day: 0.12    Years: 63.00    Types: Cigarettes    Quit date: 11/11/2016  . Smokeless tobacco: Never Used  . Alcohol use No     Allergies   Aleve [naproxen sodium]; Hydrocodone; Statins; Gluten meal; Wheat bran; Celebrex [celecoxib]; Codeine; and Morphine and related   Review of Systems Review  of Systems  All other systems reviewed and are negative.   Physical Exam Updated Vital Signs BP (!) 190/73   Pulse 82   Temp 97.9 F (36.6 C) (Oral)   Resp 17   Ht 5' 1.5" (1.562 m)   Wt 54.4 kg (120 lb)   SpO2 (!) 89%   BMI 22.31 kg/m   Physical Exam  Constitutional: She appears well-developed and well-nourished. No distress.  HENT:  Head: Normocephalic and atraumatic.  Mouth/Throat: Oropharynx is clear and moist. No oropharyngeal exudate.  Eyes: Pupils are equal, round, and reactive to light. Conjunctivae and EOM are normal. Right eye exhibits no discharge. Left eye exhibits no discharge. No scleral icterus.  Neck: Normal range of motion. Neck supple. No JVD present. No thyromegaly present.  Cardiovascular: Normal rate, regular rhythm, normal heart sounds and intact distal pulses.  Exam reveals no gallop and no friction rub.   No murmur heard. Pulmonary/Chest: Effort normal and breath sounds normal. No respiratory distress. She has no wheezes. She has no rales.  Abdominal: Soft. Bowel sounds are normal. She exhibits distension. She exhibits no mass. There is tenderness.  Normal bowel sounds, diffuse abdominal tenderness but seems to be predominantly the upper abdomen. There is no obvious guarding or peritoneal signs, there is some tympanitic sounds to percussion in the upper abdomen  Musculoskeletal: Normal range of motion. She exhibits no edema or tenderness.  Lymphadenopathy:    She has no cervical adenopathy.  Neurological: She is alert. Coordination normal.  Skin: Skin is warm and dry. No rash noted. No erythema.  Psychiatric: She has a normal mood and affect. Her behavior is normal.  Nursing note and vitals reviewed.    ED Treatments / Results  Labs (all labs ordered are listed, but only abnormal results are displayed) Labs Reviewed  CBC WITH DIFFERENTIAL/PLATELET - Abnormal; Notable for the following:       Result Value   WBC 12.3 (*)    Platelets 531 (*)     Neutro Abs 11.5 (*)    Lymphs Abs 0.5 (*)    All other components within normal limits  COMPREHENSIVE METABOLIC PANEL - Abnormal; Notable for the following:    Sodium 125 (*)    Chloride 87 (*)    Glucose, Bld 177 (*)    BUN 21 (*)    Calcium 8.4 (*)    All other components within normal limits  URINE CULTURE  LIPASE, BLOOD  URINALYSIS, ROUTINE  W REFLEX MICROSCOPIC    EKG  EKG Interpretation None       Radiology Ct Abdomen Pelvis W Contrast  Result Date: 12/02/2016 CLINICAL DATA:  Upper abdominal pain radiating into lower back. 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 EXAM: CT ABDOMEN AND PELVIS WITH CONTRAST TECHNIQUE: Multidetector CT imaging of the abdomen and pelvis was performed using the standard protocol following bolus administration of intravenous contrast. CONTRAST:  154mL ISOVUE-300 IOPAMIDOL (ISOVUE-300) INJECTION 61% COMPARISON:  Prior CTA of the abdomen and pelvis on 10/22/2014 FINDINGS: Lower chest: No acute abnormality. Hepatobiliary: No focal liver abnormality is seen. Status post cholecystectomy. No biliary dilatation. Pancreas: Unremarkable. No pancreatic ductal dilatation or surrounding inflammatory changes. Spleen: Normal in size without focal abnormality. Adrenals/Urinary Tract: Scattered nonobstructing small bilateral renal calculi. Stomach/Bowel: No evidence of bowel obstruction or free intraperitoneal air. There are multiple fluid filled small bowel loops which appear mildly hyperemic and top-normal in size. This is nonspecific but may be secondary to enteritis. There also is suggestion of potentially mild colonic wall thickening beginning at the level of the mid transverse colon and extending into the proximal sigmoid colon. This may also implicate a component of colitis. There is a large amount of fecal material in the rectum. Sigmoid diverticulosis present without evidence of acute diverticulitis. No bowel lesions or evidence of  focal abscess. 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 The celiac axis and inferior mesenteric artery remain open. Reproductive: Status post hysterectomy. No adnexal masses. Other: No abdominal wall hernia or abnormality. No abdominopelvic ascites. Musculoskeletal: Stable advanced degenerative disc disease of the lumbar spine with chronic compression of the L1 vertebral body. IMPRESSION: 1. Possible small bowel all enteritis as well as component of potential segmental colitis involving the distal transverse and descending colon. No evidence of bowel perforation or focal abscess. 2. 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 Electronically Signed   By: Aletta Edouard M.D.   On: 12/02/2016 13:34    Procedures Procedures (including critical care time)  Medications Ordered in ED Medications  ciprofloxacin (CIPRO) IVPB 400 mg (not administered)  metroNIDAZOLE (FLAGYL) tablet 500 mg (not administered)  diltiazem (CARDIZEM CD) 24 hr capsule 120 mg (not administered)  ondansetron (ZOFRAN) injection 4 mg (4 mg Intravenous Given 12/02/16 1118)  gi cocktail (Maalox,Lidocaine,Donnatal) (30 mLs Oral Given 12/02/16 1117)  famotidine (PEPCID) IVPB 20 mg premix (0 mg Intravenous Stopped 12/02/16 1202)  pantoprazole (PROTONIX) injection 40 mg (40 mg Intravenous Given 12/02/16 1117)  iopamidol (ISOVUE-300) 61 % injection 100 mL (100 mLs Intravenous Contrast Given 12/02/16 1309)  sodium chloride 0.9 % bolus 500 mL (500 mLs Intravenous New Bag/Given 12/02/16 1447)     Initial Impression / Assessment and Plan / ED Course  I have reviewed the triage vital signs and the nursing notes.  Pertinent labs & imaging results that were available during my care of the patient were reviewed by me and considered in  my medical decision making (see chart for details).      The patient has belching throughout the entire exam. She has signs of bad acid reflux but this could also be gastritis or consider bowel obstruction given her history of cholecystectomy, hysterectomy.  Npo, fluids, zofran, CT and labs  Blood pressure has creeped up and is now just over 200, home medicines given as the patient did  not have them today  CT scan shows some enteritis and some colitis, labs show that she has hyponatremia of 125  I discussed these results with the patient and her family members, she is uncomfortable going home because of generalized weakness and inability to get back and forth to the bathroom. This could be related to her overall picture with her enteritis and hyponatremia, the family is unable to care for her, we'll consult with hospitalist for admission. Cipro and Flagyl added for her enteritis and colitis.  IVF given Cipro and flagyl Pt tolerated some oral intake  D/w Dr. Clementeen Graham - will admit  Vitals:   12/02/16 1130 12/02/16 1200 12/02/16 1230 12/02/16 1500  BP: (!) 194/62 (!) 186/77 (!) 152/61 (!) 190/73  Pulse: 78 83 81 82  Resp: 11 17 (!) 21 17  Temp:      TempSrc:      SpO2: 95% (!) 89% 97% (!) 89%  Weight:      Height:         Final Clinical Impressions(s) / ED Diagnoses   Final diagnoses:  Hyponatremia  Colitis    New Prescriptions New Prescriptions   No medications on file     Noemi Chapel, MD 12/02/16 1512

## 2016-12-02 NOTE — ED Notes (Signed)
Pt tolerating po at this time.

## 2016-12-02 NOTE — ED Notes (Signed)
Pt placed on 2L of oxygen per son's request and for o2 sat 89%.  Pt states she has been a smoker for 50 years and does not want to wear oxygen.  Denies sob.

## 2016-12-03 DIAGNOSIS — K5909 Other constipation: Secondary | ICD-10-CM | POA: Diagnosis present

## 2016-12-03 DIAGNOSIS — Z791 Long term (current) use of non-steroidal anti-inflammatories (NSAID): Secondary | ICD-10-CM | POA: Diagnosis not present

## 2016-12-03 DIAGNOSIS — E871 Hypo-osmolality and hyponatremia: Secondary | ICD-10-CM | POA: Diagnosis present

## 2016-12-03 DIAGNOSIS — E039 Hypothyroidism, unspecified: Secondary | ICD-10-CM | POA: Diagnosis present

## 2016-12-03 DIAGNOSIS — M797 Fibromyalgia: Secondary | ICD-10-CM | POA: Diagnosis present

## 2016-12-03 DIAGNOSIS — I739 Peripheral vascular disease, unspecified: Secondary | ICD-10-CM | POA: Diagnosis present

## 2016-12-03 DIAGNOSIS — Z8673 Personal history of transient ischemic attack (TIA), and cerebral infarction without residual deficits: Secondary | ICD-10-CM | POA: Diagnosis not present

## 2016-12-03 DIAGNOSIS — F419 Anxiety disorder, unspecified: Secondary | ICD-10-CM | POA: Diagnosis present

## 2016-12-03 DIAGNOSIS — K529 Noninfective gastroenteritis and colitis, unspecified: Secondary | ICD-10-CM | POA: Diagnosis present

## 2016-12-03 DIAGNOSIS — Z7982 Long term (current) use of aspirin: Secondary | ICD-10-CM | POA: Diagnosis not present

## 2016-12-03 DIAGNOSIS — M1991 Primary osteoarthritis, unspecified site: Secondary | ICD-10-CM | POA: Diagnosis present

## 2016-12-03 DIAGNOSIS — Z888 Allergy status to other drugs, medicaments and biological substances status: Secondary | ICD-10-CM | POA: Diagnosis not present

## 2016-12-03 DIAGNOSIS — J449 Chronic obstructive pulmonary disease, unspecified: Secondary | ICD-10-CM | POA: Diagnosis present

## 2016-12-03 DIAGNOSIS — K5790 Diverticulosis of intestine, part unspecified, without perforation or abscess without bleeding: Secondary | ICD-10-CM | POA: Diagnosis present

## 2016-12-03 DIAGNOSIS — F329 Major depressive disorder, single episode, unspecified: Secondary | ICD-10-CM | POA: Diagnosis present

## 2016-12-03 DIAGNOSIS — Z79899 Other long term (current) drug therapy: Secondary | ICD-10-CM | POA: Diagnosis not present

## 2016-12-03 DIAGNOSIS — Z886 Allergy status to analgesic agent status: Secondary | ICD-10-CM | POA: Diagnosis not present

## 2016-12-03 DIAGNOSIS — K2900 Acute gastritis without bleeding: Secondary | ICD-10-CM | POA: Diagnosis present

## 2016-12-03 DIAGNOSIS — N3941 Urge incontinence: Secondary | ICD-10-CM | POA: Diagnosis present

## 2016-12-03 DIAGNOSIS — E860 Dehydration: Secondary | ICD-10-CM | POA: Diagnosis present

## 2016-12-03 DIAGNOSIS — Z87891 Personal history of nicotine dependence: Secondary | ICD-10-CM | POA: Diagnosis not present

## 2016-12-03 DIAGNOSIS — K219 Gastro-esophageal reflux disease without esophagitis: Secondary | ICD-10-CM | POA: Diagnosis present

## 2016-12-03 DIAGNOSIS — Z885 Allergy status to narcotic agent status: Secondary | ICD-10-CM | POA: Diagnosis not present

## 2016-12-03 DIAGNOSIS — I10 Essential (primary) hypertension: Secondary | ICD-10-CM | POA: Diagnosis present

## 2016-12-03 LAB — CBC
HEMATOCRIT: 32.9 % — AB (ref 36.0–46.0)
HEMOGLOBIN: 11 g/dL — AB (ref 12.0–15.0)
MCH: 30.7 pg (ref 26.0–34.0)
MCHC: 33.4 g/dL (ref 30.0–36.0)
MCV: 91.9 fL (ref 78.0–100.0)
Platelets: 455 10*3/uL — ABNORMAL HIGH (ref 150–400)
RBC: 3.58 MIL/uL — ABNORMAL LOW (ref 3.87–5.11)
RDW: 13.5 % (ref 11.5–15.5)
WBC: 10.5 10*3/uL (ref 4.0–10.5)

## 2016-12-03 LAB — BASIC METABOLIC PANEL
ANION GAP: 7 (ref 5–15)
BUN: 14 mg/dL (ref 6–20)
CALCIUM: 7.3 mg/dL — AB (ref 8.9–10.3)
CO2: 24 mmol/L (ref 22–32)
Chloride: 91 mmol/L — ABNORMAL LOW (ref 101–111)
Creatinine, Ser: 0.43 mg/dL — ABNORMAL LOW (ref 0.44–1.00)
GFR calc Af Amer: >60 mL/min (ref >60)
Glucose, Bld: 125 mg/dL — ABNORMAL HIGH (ref 65–99)
POTASSIUM: 3.6 mmol/L (ref 3.5–5.1)
SODIUM: 122 mmol/L — AB (ref 135–145)

## 2016-12-03 MED ORDER — SODIUM CHLORIDE 0.9 % IV SOLN
10.0000 mg | INTRAVENOUS | Status: DC
  Filled 2016-12-03 (×2): qty 1

## 2016-12-03 MED ORDER — FAMOTIDINE IN NACL 20-0.9 MG/50ML-% IV SOLN
20.0000 mg | INTRAVENOUS | Status: DC
  Administered 2016-12-03 – 2016-12-05 (×3): 20 mg via INTRAVENOUS
  Filled 2016-12-03 (×3): qty 50

## 2016-12-03 MED ORDER — DILTIAZEM HCL ER COATED BEADS 120 MG PO CP24
120.0000 mg | ORAL_CAPSULE | Freq: Every day | ORAL | Status: DC
  Administered 2016-12-03 – 2016-12-05 (×3): 120 mg via ORAL
  Filled 2016-12-03 (×3): qty 1

## 2016-12-03 NOTE — Plan of Care (Signed)
Problem: Pain Managment: Goal: General experience of comfort will improve Outcome: Not Progressing Pt has had a difficult time getting comfortable. She has required pain medication at least twice in the earlier part of the shift. Patient had a misunderstanding of pain management and pain assessment. Education provide.   Problem: Physical Regulation: Goal: Ability to maintain clinical measurements within normal limits will improve Outcome: Progressing See doc flowsheet. Patient previously had high blood pressures. Her blood pressures are trending down after interventions.  Goal: Will remain free from infection Outcome: Not Progressing See patient diagnosis  Problem: Activity: Goal: Risk for activity intolerance will decrease Outcome: Not Progressing Patient has been encouraged to move and not stay in one spot in bed. Patient can turn herself and is prompted to do so.   Problem: Bowel/Gastric: Goal: Will not experience complications related to bowel motility Outcome: Progressing Patient had a bowel movement this shift.

## 2016-12-03 NOTE — Progress Notes (Addendum)
Subjective: She was admitted yesterday with colitis. She is doing better today. She is still having diarrhea. She is on antibiotics. Her abdominal pain is much better. Her shoulders doing okay.   Objective: Vital signs in last 24 hours: Temp:  [97.5 F (36.4 C)-98.7 F (37.1 C)] 98.7 F (37.1 C) (10/13 2345) Pulse Rate:  [73-92] 90 (10/13 2345) Resp:  [11-22] 18 (10/13 1559) BP: (146-194)/(54-114) 154/93 (10/13 2345) SpO2:  [89 %-99 %] 99 % (10/13 2345) Weight:  [53.5 kg (118 lb)-54.4 kg (120 lb)] 53.5 kg (118 lb) (10/13 1559) Weight change:  Last BM Date: 12/02/16  Intake/Output from previous day: 10/13 0701 - 10/14 0700 In: 2331.7 [P.O.:360; I.V.:1296.7; IV Piggyback:675] Out: 100 [Urine:100]  PHYSICAL EXAM General appearance: alert, cooperative and mild distress Resp: clear to auscultation bilaterally Cardio: regular rate and rhythm, S1, S2 normal, no murmur, click, rub or gallop GI: soft, non-tender; bowel sounds normal; no masses,  no organomegaly Extremities: extremities normal, atraumatic, no cyanosis or edema Skin warm and dry  Lab Results:  Results for orders placed or performed during the hospital encounter of 12/02/16 (from the past 48 hour(s))  CBC with Differential/Platelet     Status: Abnormal   Collection Time: 12/02/16 11:33 AM  Result Value Ref Range   WBC 12.3 (H) 4.0 - 10.5 K/uL   RBC 3.96 3.87 - 5.11 MIL/uL   Hemoglobin 12.3 12.0 - 15.0 g/dL   HCT 36.3 36.0 - 46.0 %   MCV 91.7 78.0 - 100.0 fL   MCH 31.1 26.0 - 34.0 pg   MCHC 33.9 30.0 - 36.0 g/dL   RDW 13.5 11.5 - 15.5 %   Platelets 531 (H) 150 - 400 K/uL   Neutrophils Relative % 93 %   Neutro Abs 11.5 (H) 1.7 - 7.7 K/uL   Lymphocytes Relative 4 %   Lymphs Abs 0.5 (L) 0.7 - 4.0 K/uL   Monocytes Relative 3 %   Monocytes Absolute 0.4 0.1 - 1.0 K/uL   Eosinophils Relative 0 %   Eosinophils Absolute 0.0 0.0 - 0.7 K/uL   Basophils Relative 0 %   Basophils Absolute 0.0 0.0 - 0.1 K/uL   Comprehensive metabolic panel     Status: Abnormal   Collection Time: 12/02/16 11:33 AM  Result Value Ref Range   Sodium 125 (L) 135 - 145 mmol/L   Potassium 3.7 3.5 - 5.1 mmol/L   Chloride 87 (L) 101 - 111 mmol/L   CO2 27 22 - 32 mmol/L   Glucose, Bld 177 (H) 65 - 99 mg/dL   BUN 21 (H) 6 - 20 mg/dL   Creatinine, Ser 0.65 0.44 - 1.00 mg/dL   Calcium 8.4 (L) 8.9 - 10.3 mg/dL   Total Protein 6.7 6.5 - 8.1 g/dL   Albumin 3.6 3.5 - 5.0 g/dL   AST 25 15 - 41 U/L   ALT 17 14 - 54 U/L   Alkaline Phosphatase 86 38 - 126 U/L   Total Bilirubin 0.7 0.3 - 1.2 mg/dL   GFR calc non Af Amer >60 >60 mL/min   GFR calc Af Amer >60 >60 mL/min    Comment: (NOTE) The eGFR has been calculated using the CKD EPI equation. This calculation has not been validated in all clinical situations. eGFR's persistently <60 mL/min signify possible Chronic Kidney Disease.    Anion gap 11 5 - 15  Lipase, blood     Status: None   Collection Time: 12/02/16 11:33 AM  Result Value Ref Range  Lipase 36 11 - 51 U/L  Urinalysis, Routine w reflex microscopic     Status: Abnormal   Collection Time: 12/02/16  1:52 PM  Result Value Ref Range   Color, Urine YELLOW YELLOW   APPearance HAZY (A) CLEAR   Specific Gravity, Urine 1.030 1.005 - 1.030   pH 5.0 5.0 - 8.0   Glucose, UA NEGATIVE NEGATIVE mg/dL   Hgb urine dipstick NEGATIVE NEGATIVE   Bilirubin Urine NEGATIVE NEGATIVE   Ketones, ur NEGATIVE NEGATIVE mg/dL   Protein, ur 100 (A) NEGATIVE mg/dL   Nitrite NEGATIVE NEGATIVE   Leukocytes, UA NEGATIVE NEGATIVE   RBC / HPF 0-5 0 - 5 RBC/hpf   WBC, UA 0-5 0 - 5 WBC/hpf   Bacteria, UA RARE (A) NONE SEEN   Squamous Epithelial / LPF 0-5 (A) NONE SEEN   Mucus PRESENT    Hyaline Casts, UA PRESENT   Basic metabolic panel     Status: Abnormal   Collection Time: 12/03/16  6:22 AM  Result Value Ref Range   Sodium 122 (L) 135 - 145 mmol/L   Potassium 3.6 3.5 - 5.1 mmol/L   Chloride 91 (L) 101 - 111 mmol/L   CO2 24 22 -  32 mmol/L   Glucose, Bld 125 (H) 65 - 99 mg/dL   BUN 14 6 - 20 mg/dL   Creatinine, Ser 0.43 (L) 0.44 - 1.00 mg/dL   Calcium 7.3 (L) 8.9 - 10.3 mg/dL   GFR calc non Af Amer >60 >60 mL/min   GFR calc Af Amer >60 >60 mL/min    Comment: (NOTE) The eGFR has been calculated using the CKD EPI equation. This calculation has not been validated in all clinical situations. eGFR's persistently <60 mL/min signify possible Chronic Kidney Disease.    Anion gap 7 5 - 15  CBC     Status: Abnormal   Collection Time: 12/03/16  6:22 AM  Result Value Ref Range   WBC 10.5 4.0 - 10.5 K/uL   RBC 3.58 (L) 3.87 - 5.11 MIL/uL   Hemoglobin 11.0 (L) 12.0 - 15.0 g/dL   HCT 32.9 (L) 36.0 - 46.0 %   MCV 91.9 78.0 - 100.0 fL   MCH 30.7 26.0 - 34.0 pg   MCHC 33.4 30.0 - 36.0 g/dL   RDW 13.5 11.5 - 15.5 %   Platelets 455 (H) 150 - 400 K/uL    ABGS No results for input(s): PHART, PO2ART, TCO2, HCO3 in the last 72 hours.  Invalid input(s): PCO2 CULTURES No results found for this or any previous visit (from the past 240 hour(s)). Studies/Results: Ct Abdomen Pelvis W Contrast  Result Date: 12/02/2016 CLINICAL DATA:  Upper abdominal pain radiating into lower back. 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 EXAM: CT ABDOMEN AND PELVIS WITH CONTRAST TECHNIQUE: Multidetector CT imaging of the abdomen and pelvis was performed using the standard protocol following bolus administration of intravenous contrast. CONTRAST:  126m ISOVUE-300 IOPAMIDOL (ISOVUE-300) INJECTION 61% COMPARISON:  Prior CTA of the abdomen and pelvis on 10/22/2014 FINDINGS: Lower chest: No acute abnormality. Hepatobiliary: No focal liver abnormality is seen. Status post cholecystectomy. No biliary dilatation. Pancreas: Unremarkable. No pancreatic ductal dilatation or surrounding inflammatory changes. Spleen: Normal in size without focal abnormality. Adrenals/Urinary Tract: Scattered nonobstructing small bilateral renal  calculi. Stomach/Bowel: No evidence of bowel obstruction or free intraperitoneal air. There are multiple fluid filled small bowel loops which appear mildly hyperemic and top-normal in size. This is nonspecific but may be secondary to  enteritis. There also is suggestion of potentially mild colonic wall thickening beginning at the level of the mid transverse colon and extending into the proximal sigmoid colon. This may also implicate a component of colitis. There is a large amount of fecal material in the rectum. Sigmoid diverticulosis present without evidence of acute diverticulitis. No bowel lesions or evidence of focal abscess. 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 The celiac axis and inferior mesenteric artery remain open. Reproductive: Status post hysterectomy. No adnexal masses. Other: No abdominal wall hernia or abnormality. No abdominopelvic ascites. Musculoskeletal: Stable advanced degenerative disc disease of the lumbar spine with chronic compression of the L1 vertebral body. IMPRESSION: 1. Possible small bowel all enteritis as well as component of potential segmental colitis involving the distal transverse and descending colon. No evidence of bowel perforation or focal abscess. 2. 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 Electronically Signed   By: Aletta Edouard M.D.   On: 12/02/2016 13:34    Medications:  Prior to Admission:  Prescriptions Prior to Admission  Medication Sig Dispense Refill Last Dose  . Ascorbic Acid (VITAMIN C) 1000 MG tablet Take 1,000 mg by mouth 2 (two) times daily.    12/01/2016 at Unknown time  . aspirin EC 325 MG EC tablet Take 1 tablet (325 mg total) by mouth daily with breakfast. 30 tablet 0 12/01/2016 at Unknown time  . diltiazem (DILACOR XR) 120 MG 24 hr  capsule Take 120 mg by mouth every evening.   12/01/2016 at Unknown time  . diphenhydrAMINE (SOMINEX) 25 MG tablet Take 50 mg by mouth at bedtime.    12/01/2016 at Unknown time  . DULoxetine (CYMBALTA) 60 MG capsule Take 60 mg by mouth daily.   12/01/2016 at Unknown time  . ibuprofen (ADVIL,MOTRIN) 200 MG tablet Take 200 mg by mouth daily.   Taking  . Ipratropium-Albuterol (ALBUTEROL-IPRATROPIUM IN) Inhale 1 puff into the lungs 2 (two) times daily.   Taking  . levothyroxine (SYNTHROID, LEVOTHROID) 88 MCG tablet Take 1 tablet (88 mcg total) by mouth daily. 30 tablet 1 12/01/2016 at Unknown time  . metroNIDAZOLE (FLAGYL) 500 MG tablet Take 1 tablet by mouth 2 (two) times daily.   12/02/2016 at Unknown time  . Multiple Vitamin (MULTIVITAMIN WITH MINERALS) TABS Take 1 tablet by mouth daily.   12/01/2016 at Unknown time  . ondansetron (ZOFRAN) 4 MG tablet Take 1 tablet (4 mg total) by mouth every 8 (eight) hours as needed for nausea or vomiting. 40 tablet 0 12/02/2016 at Unknown time  . polyethylene glycol (MIRALAX / GLYCOLAX) packet Take 17 g by mouth 2 (two) times daily. 14 each 0 Taking   Scheduled: . aspirin  325 mg Oral Q breakfast  . diltiazem  120 mg Oral Daily  . DULoxetine  60 mg Oral Daily  . enoxaparin (LOVENOX) injection  40 mg Subcutaneous Q24H  . levothyroxine  88 mcg Oral Daily  . multivitamin with minerals  1 tablet Oral Daily  . tiotropium  18 mcg Inhalation Daily  . vitamin C  1,000 mg Oral BID  . zolpidem  5 mg Oral QHS   Continuous: . 0.9 % NaCl with KCl 20 mEq / L 100 mL/hr at 12/03/16 0400  . ciprofloxacin Stopped (12/03/16 0458)  . famotidine (PEPCID) IV Stopped (12/03/16 0854)  . metronidazole 500 mg (12/03/16 0533)   GDJ:[id number] **  OR** acetaminophen, albuterol, fentaNYL (SUBLIMAZE) injection, hydrALAZINE, ondansetron **OR** ondansetron (ZOFRAN) IV  Assesment: She is admitted with acute colitis. On my physical examination above I did not mention that she still  has some abdominal tenderness and hyperactive bowel sounds. She has COPD which is stable. She is improving slowly. Her hyponatremia is a little bit worse this morning but I suspect it's from her current illness and should improve with hydration Principal Problem:   Acute colitis Active Problems:   Tobacco use disorder   Weakness   COPD (chronic obstructive pulmonary disease) (HCC)   Uncontrolled hypertension   Enteritis   Hyponatremia    Plan: Continue current treatments.    LOS: 0 days   Jayin Derousse L 12/03/2016, 9:38 AM

## 2016-12-04 ENCOUNTER — Other Ambulatory Visit: Payer: Self-pay | Admitting: *Deleted

## 2016-12-04 LAB — BASIC METABOLIC PANEL
ANION GAP: 5 (ref 5–15)
BUN: 15 mg/dL (ref 6–20)
CO2: 22 mmol/L (ref 22–32)
Calcium: 7.5 mg/dL — ABNORMAL LOW (ref 8.9–10.3)
Chloride: 95 mmol/L — ABNORMAL LOW (ref 101–111)
Creatinine, Ser: 0.67 mg/dL (ref 0.44–1.00)
GFR calc Af Amer: >60 mL/min (ref >60)
Glucose, Bld: 119 mg/dL — ABNORMAL HIGH (ref 65–99)
POTASSIUM: 4.3 mmol/L (ref 3.5–5.1)
SODIUM: 122 mmol/L — AB (ref 135–145)

## 2016-12-04 NOTE — Patient Outreach (Signed)
[facility]) Care Management  12/04/2016  NANETTA WIEGMAN April 01, 1935 [id number]   Referral received ON 11/28/16 from our telephonic case management team on [name] who was referred to [facility] by Dr. Sinda Du. I have been unable to reach Mrs. Rowley by phone over the last week.   Mrs. Dawe was discharged to home from [facility] on 11/25/16. Mrs. Lagerstrom has a medical history which includes CHF, HTN, and CVA. She is reported to live alone and history of falls. She is active with [facility].   On 12/02/16, Mrs. Loper was admitted to the hospital with colitis.   Plan: I will follow Mrs. Horkey's progress closely and will collaborate with [facility] in an attempt to make contact with Mrs. Deardorff by phone within 48 hours after her hospital discharge.    [facility] Management  [phone]

## 2016-12-04 NOTE — Care Management Note (Signed)
Case Management Note  Patient Details  Name: [name] MRN: [id number] Date of Birth: April 06, 1935  Subjective/Objective:                 Admitted with colitis, recently DC'd from [facility] and active with [facility]. Pt lives alone, has son who checks on her often, two other sons who live out of town. She is ind with ADL's. She uses [facility]W. She plans to return home at DC. CM discussed [facility] services with pt and that they had been attempting contact since her DC from [facility]. Pt says she will expect a call after DC. She communicates no concerns about DC plan.    Action/Plan: DC home with resumption of [facility] services. Vaughan Basta, [facility] rep, aware of admission. Will need order to resume services. CM will cont to follow.   Expected Discharge Date:     12/09/2016             Expected Discharge Plan:  [place]  In-House Referral:  NA  Discharge planning Services  CM Consult  Post Acute Care Choice:  Home Health, Resumption of Svcs/PTA Provider Choice offered to:  Patient  HH Arranged:  RN, PT, OT [facility] Agency:  [place]  Status of Service:  In process, will continue to follow  Sherald Barge, RN 12/04/2016, 2:52 PM

## 2016-12-04 NOTE — Progress Notes (Signed)
Subjective: She says she feels much better. Much less abdominal discomfort. She's not nauseated. She's not having any diarrhea now. Her breathing is okay. No other new complaints except for weakness  Objective: Vital signs in last 24 hours: Temp:  [97.6 F (36.4 C)-97.8 F (36.6 C)] 97.6 F (36.4 C) (10/15 0606) Pulse Rate:  [46-61] 46 (10/15 0606) Resp:  [15-46] 46 (10/15 0606) BP: (100-139)/(32-62) 100/32 (10/15 0606) SpO2:  [92 %-96 %] 92 % (10/15 0811) Weight change:  Last BM Date: 12/03/16  Intake/Output from previous day: 10/14 0701 - 10/15 0700 In: 2011.7 [P.O.:720; I.V.:941.7; IV Piggyback:350] Out: -   PHYSICAL EXAM General appearance: alert, cooperative and mild distress Resp: rhonchi bilaterally Cardio: regular rate and rhythm, S1, S2 normal, no murmur, click, rub or gallop GI: soft, non-tender; bowel sounds normal; no masses,  no organomegaly Extremities: extremities normal, atraumatic, no cyanosis or edema Skin turgor better  Lab Results:  Results for orders placed or performed during the hospital encounter of 12/02/16 (from the past 48 hour(s))  CBC with Differential/Platelet     Status: Abnormal   Collection Time: 12/02/16 11:33 AM  Result Value Ref Range   WBC 12.3 (H) 4.0 - 10.5 K/uL   RBC 3.96 3.87 - 5.11 MIL/uL   Hemoglobin 12.3 12.0 - 15.0 g/dL   HCT 36.3 36.0 - 46.0 %   MCV 91.7 78.0 - 100.0 fL   MCH 31.1 26.0 - 34.0 pg   MCHC 33.9 30.0 - 36.0 g/dL   RDW 13.5 11.5 - 15.5 %   Platelets 531 (H) 150 - 400 K/uL   Neutrophils Relative % 93 %   Neutro Abs 11.5 (H) 1.7 - 7.7 K/uL   Lymphocytes Relative 4 %   Lymphs Abs 0.5 (L) 0.7 - 4.0 K/uL   Monocytes Relative 3 %   Monocytes Absolute 0.4 0.1 - 1.0 K/uL   Eosinophils Relative 0 %   Eosinophils Absolute 0.0 0.0 - 0.7 K/uL   Basophils Relative 0 %   Basophils Absolute 0.0 0.0 - 0.1 K/uL  Comprehensive metabolic panel     Status: Abnormal   Collection Time: 12/02/16 11:33 AM  Result Value Ref Range    Sodium 125 (L) 135 - 145 mmol/L   Potassium 3.7 3.5 - 5.1 mmol/L   Chloride 87 (L) 101 - 111 mmol/L   CO2 27 22 - 32 mmol/L   Glucose, Bld 177 (H) 65 - 99 mg/dL   BUN 21 (H) 6 - 20 mg/dL   Creatinine, Ser 0.65 0.44 - 1.00 mg/dL   Calcium 8.4 (L) 8.9 - 10.3 mg/dL   Total Protein 6.7 6.5 - 8.1 g/dL   Albumin 3.6 3.5 - 5.0 g/dL   AST 25 15 - 41 U/L   ALT 17 14 - 54 U/L   Alkaline Phosphatase 86 38 - 126 U/L   Total Bilirubin 0.7 0.3 - 1.2 mg/dL   GFR calc non Af Amer >60 >60 mL/min   GFR calc Af Amer >60 >60 mL/min    Comment: (NOTE) The eGFR has been calculated using the CKD EPI equation. This calculation has not been validated in all clinical situations. eGFR's persistently <60 mL/min signify possible Chronic Kidney Disease.    Anion gap 11 5 - 15  Lipase, blood     Status: None   Collection Time: 12/02/16 11:33 AM  Result Value Ref Range   Lipase 36 11 - 51 U/L  Urinalysis, Routine w reflex microscopic     Status: Abnormal  Collection Time: 12/02/16  1:52 PM  Result Value Ref Range   Color, Urine YELLOW YELLOW   APPearance HAZY (A) CLEAR   Specific Gravity, Urine 1.030 1.005 - 1.030   pH 5.0 5.0 - 8.0   Glucose, UA NEGATIVE NEGATIVE mg/dL   Hgb urine dipstick NEGATIVE NEGATIVE   Bilirubin Urine NEGATIVE NEGATIVE   Ketones, ur NEGATIVE NEGATIVE mg/dL   Protein, ur 100 (A) NEGATIVE mg/dL   Nitrite NEGATIVE NEGATIVE   Leukocytes, UA NEGATIVE NEGATIVE   RBC / HPF 0-5 0 - 5 RBC/hpf   WBC, UA 0-5 0 - 5 WBC/hpf   Bacteria, UA RARE (A) NONE SEEN   Squamous Epithelial / LPF 0-5 (A) NONE SEEN   Mucus PRESENT    Hyaline Casts, UA PRESENT   Basic metabolic panel     Status: Abnormal   Collection Time: 12/03/16  6:22 AM  Result Value Ref Range   Sodium 122 (L) 135 - 145 mmol/L   Potassium 3.6 3.5 - 5.1 mmol/L   Chloride 91 (L) 101 - 111 mmol/L   CO2 24 22 - 32 mmol/L   Glucose, Bld 125 (H) 65 - 99 mg/dL   BUN 14 6 - 20 mg/dL   Creatinine, Ser 0.43 (L) 0.44 - 1.00 mg/dL    Calcium 7.3 (L) 8.9 - 10.3 mg/dL   GFR calc non Af Amer >60 >60 mL/min   GFR calc Af Amer >60 >60 mL/min    Comment: (NOTE) The eGFR has been calculated using the CKD EPI equation. This calculation has not been validated in all clinical situations. eGFR's persistently <60 mL/min signify possible Chronic Kidney Disease.    Anion gap 7 5 - 15  CBC     Status: Abnormal   Collection Time: 12/03/16  6:22 AM  Result Value Ref Range   WBC 10.5 4.0 - 10.5 K/uL   RBC 3.58 (L) 3.87 - 5.11 MIL/uL   Hemoglobin 11.0 (L) 12.0 - 15.0 g/dL   HCT 32.9 (L) 36.0 - 46.0 %   MCV 91.9 78.0 - 100.0 fL   MCH 30.7 26.0 - 34.0 pg   MCHC 33.4 30.0 - 36.0 g/dL   RDW 13.5 11.5 - 15.5 %   Platelets 455 (H) 150 - 400 K/uL    ABGS No results for input(s): PHART, PO2ART, TCO2, HCO3 in the last 72 hours.  Invalid input(s): PCO2 CULTURES No results found for this or any previous visit (from the past 240 hour(s)). Studies/Results: Ct Abdomen Pelvis W Contrast  Result Date: 12/02/2016 CLINICAL DATA:  Upper abdominal pain radiating into lower back. 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 EXAM: CT ABDOMEN AND PELVIS WITH CONTRAST TECHNIQUE: Multidetector CT imaging of the abdomen and pelvis was performed using the standard protocol following bolus administration of intravenous contrast. CONTRAST:  179m ISOVUE-300 IOPAMIDOL (ISOVUE-300) INJECTION 61% COMPARISON:  Prior CTA of the abdomen and pelvis on 10/22/2014 FINDINGS: Lower chest: No acute abnormality. Hepatobiliary: No focal liver abnormality is seen. Status post cholecystectomy. No biliary dilatation. Pancreas: Unremarkable. No pancreatic ductal dilatation or surrounding inflammatory changes. Spleen: Normal in size without focal abnormality. Adrenals/Urinary Tract: Scattered nonobstructing small bilateral renal calculi. Stomach/Bowel: No evidence of bowel obstruction or free intraperitoneal air. There are multiple fluid filled  small bowel loops which appear mildly hyperemic and top-normal in size. This is nonspecific but may be secondary to enteritis. There also is suggestion of potentially mild colonic wall thickening beginning at the level of the mid transverse colon  and extending into the proximal sigmoid colon. This may also implicate a component of colitis. There is a large amount of fecal material in the rectum. Sigmoid diverticulosis present without evidence of acute diverticulitis. No bowel lesions or evidence of focal abscess. 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 The celiac axis and inferior mesenteric artery remain open. Reproductive: Status post hysterectomy. No adnexal masses. Other: No abdominal wall hernia or abnormality. No abdominopelvic ascites. Musculoskeletal: Stable advanced degenerative disc disease of the lumbar spine with chronic compression of the L1 vertebral body. IMPRESSION: 1. Possible small bowel all enteritis as well as component of potential segmental colitis involving the distal transverse and descending colon. No evidence of bowel perforation or focal abscess. 2. 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 Electronically Signed   By: Aletta Edouard M.D.   On: 12/02/2016 13:34    Medications:  Prior to Admission:  Prescriptions Prior to Admission  Medication Sig Dispense Refill Last Dose  . Ascorbic Acid (VITAMIN C) 1000 MG tablet Take 1,000 mg by mouth 2 (two) times daily.    12/01/2016 at Unknown time  . aspirin EC 325 MG EC tablet Take 1 tablet (325 mg total) by mouth daily with breakfast. 30 tablet 0 12/01/2016 at Unknown time  . diltiazem (DILACOR XR) 120 MG 24 hr capsule Take 120 mg by mouth every evening.   12/01/2016 at Unknown time  . diphenhydrAMINE (SOMINEX) 25 MG tablet  Take 50 mg by mouth at bedtime.    12/01/2016 at Unknown time  . DULoxetine (CYMBALTA) 60 MG capsule Take 60 mg by mouth daily.   12/01/2016 at Unknown time  . ibuprofen (ADVIL,MOTRIN) 200 MG tablet Take 200 mg by mouth daily.   Taking  . Ipratropium-Albuterol (ALBUTEROL-IPRATROPIUM IN) Inhale 1 puff into the lungs 2 (two) times daily.   Taking  . levothyroxine (SYNTHROID, LEVOTHROID) 88 MCG tablet Take 1 tablet (88 mcg total) by mouth daily. 30 tablet 1 12/01/2016 at Unknown time  . metroNIDAZOLE (FLAGYL) 500 MG tablet Take 1 tablet by mouth 2 (two) times daily.   12/02/2016 at Unknown time  . Multiple Vitamin (MULTIVITAMIN WITH MINERALS) TABS Take 1 tablet by mouth daily.   12/01/2016 at Unknown time  . ondansetron (ZOFRAN) 4 MG tablet Take 1 tablet (4 mg total) by mouth every 8 (eight) hours as needed for nausea or vomiting. 40 tablet 0 12/02/2016 at Unknown time  . polyethylene glycol (MIRALAX / GLYCOLAX) packet Take 17 g by mouth 2 (two) times daily. 14 each 0 Taking   Scheduled: . aspirin  325 mg Oral Q breakfast  . diltiazem  120 mg Oral Daily  . DULoxetine  60 mg Oral Daily  . enoxaparin (LOVENOX) injection  40 mg Subcutaneous Q24H  . levothyroxine  88 mcg Oral Daily  . multivitamin with minerals  1 tablet Oral Daily  . tiotropium  18 mcg Inhalation Daily  . vitamin C  1,000 mg Oral BID  . zolpidem  5 mg Oral QHS   Continuous: . 0.9 % NaCl with KCl 20 mEq / L 100 mL/hr at 12/04/16 0837  . ciprofloxacin Stopped (12/04/16 0457)  . famotidine (PEPCID) IV Stopped (12/03/16 0854)  . metronidazole 500 mg (12/04/16 9147)   WGN:[id number] **OR** acetaminophen, albuterol, fentaNYL (SUBLIMAZE) injection, hydrALAZINE, ondansetron **OR** ondansetron (ZOFRAN) IV  Assesment: She was admitted with colitis. She  is much improved today. She has hyponatremia I think related to her current illness and that will be rechecked today. At baseline she has COPD and that's pretty stable however she is  still smoking and knows she needs to stop. She is very weak. Principal Problem:   Acute colitis Active Problems:   Tobacco use disorder   Weakness   COPD (chronic obstructive pulmonary disease) (HCC)   Uncontrolled hypertension   Enteritis   Hyponatremia    Plan: Continue IV antibiotics. Recheck sodium level today. Request physical therapy consultation. Advance her diet.    LOS: 1 day   [name] L 12/04/2016, 8:45 AM

## 2016-12-05 ENCOUNTER — Other Ambulatory Visit: Payer: Self-pay | Admitting: *Deleted

## 2016-12-05 LAB — BASIC METABOLIC PANEL
ANION GAP: 8 (ref 5–15)
BUN: 14 mg/dL (ref 6–20)
CHLORIDE: 98 mmol/L — AB (ref 101–111)
CO2: 21 mmol/L — AB (ref 22–32)
Calcium: 7.8 mg/dL — ABNORMAL LOW (ref 8.9–10.3)
Creatinine, Ser: 0.66 mg/dL (ref 0.44–1.00)
GFR calc non Af Amer: >60 mL/min (ref >60)
GLUCOSE: 81 mg/dL (ref 65–99)
Potassium: 4.6 mmol/L (ref 3.5–5.1)
Sodium: 127 mmol/L — ABNORMAL LOW (ref 135–145)

## 2016-12-05 LAB — URINE CULTURE: Culture: 20000 — AB

## 2016-12-05 MED ORDER — CIPROFLOXACIN HCL 500 MG PO TABS: 500.0000 mg | ORAL_TABLET | Freq: Two times a day (BID) | ORAL | 0 refills | Status: DC
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
## 2016-12-05 NOTE — Patient Outreach (Signed)
[facility]) Care Management  12/05/2016  SOLENNE MANWARREN [birth year] [id number]  Mrs. Spicher's discharge to home today is noted.   Plan: I will reach out to Mrs. Duerst by phone tomorrow to initiate transition of care services.    [facility]  [phone]

## 2016-12-05 NOTE — Care Management Important Message (Signed)
Important Message  Patient Details  Name: [name] MRN: [id number] Date of Birth: [birth year]   Medicare Important Message Given:  Yes    Sherald Barge, RN 12/05/2016, 11:57 AM

## 2016-12-05 NOTE — Progress Notes (Signed)
Pt discharged home today per Dr. Luan Pulling. Pt's IV site D/C'd and WDL. Pt's VSS. Pt provided with home medication list, discharge instructions and prescriptions. Verbalized understanding. Pt left floor via WC in stable condition accompanied by RN.

## 2016-12-05 NOTE — Progress Notes (Signed)
Subjective: She feels better and wants to go home. She has no new complaints. Her abdomen is much better. No nausea no vomiting no diarrhea  Objective: Vital signs in last 24 hours: Temp:  [97.7 F (36.5 C)-98.1 F (36.7 C)] 97.7 F (36.5 C) (10/16 0550) Pulse Rate:  [58-72] 72 (10/16 0550) Resp:  [15-20] 15 (10/16 0550) BP: (127-138)/(42-69) 137/69 (10/16 0550) SpO2:  [93 %-95 %] 93 % (10/16 0550) Weight change:  Last BM Date: 12/04/16  Intake/Output from previous day: 10/15 0701 - 10/16 0700 In: 5558.3 [P.O.:840; I.V.:3668.3; IV Piggyback:1050] Out: 200 [Urine:200]  PHYSICAL EXAM General appearance: alert, cooperative and no distress Resp: clear to auscultation bilaterally Cardio: regular rate and rhythm, S1, S2 normal, no murmur, click, rub or gallop GI: soft, non-tender; bowel sounds normal; no masses,  no organomegaly Extremities: extremities normal, atraumatic, no cyanosis or edema Skin warm and dry skin turgor good  Lab Results:  Results for orders placed or performed during the hospital encounter of 12/02/16 (from the past 48 hour(s))  Basic metabolic panel     Status: Abnormal   Collection Time: 12/04/16 12:34 PM  Result Value Ref Range   Sodium 122 (L) 135 - 145 mmol/L   Potassium 4.3 3.5 - 5.1 mmol/L   Chloride 95 (L) 101 - 111 mmol/L   CO2 22 22 - 32 mmol/L   Glucose, Bld 119 (H) 65 - 99 mg/dL   BUN 15 6 - 20 mg/dL   Creatinine, Ser 0.67 0.44 - 1.00 mg/dL   Calcium 7.5 (L) 8.9 - 10.3 mg/dL   GFR calc non Af Amer >60 >60 mL/min   GFR calc Af Amer >60 >60 mL/min    Comment: (NOTE) The eGFR has been calculated using the CKD EPI equation. This calculation has not been validated in all clinical situations. eGFR's persistently <60 mL/min signify possible Chronic Kidney Disease.    Anion gap 5 5 - 15  Basic metabolic panel     Status: Abnormal   Collection Time: 12/05/16  5:58 AM  Result Value Ref Range   Sodium 127 (L) 135 - 145 mmol/L   Potassium 4.6 3.5  - 5.1 mmol/L   Chloride 98 (L) 101 - 111 mmol/L   CO2 21 (L) 22 - 32 mmol/L   Glucose, Bld 81 65 - 99 mg/dL   BUN 14 6 - 20 mg/dL   Creatinine, Ser 0.66 0.44 - 1.00 mg/dL   Calcium 7.8 (L) 8.9 - 10.3 mg/dL   GFR calc non Af Amer >60 >60 mL/min   GFR calc Af Amer >60 >60 mL/min    Comment: (NOTE) The eGFR has been calculated using the CKD EPI equation. This calculation has not been validated in all clinical situations. eGFR's persistently <60 mL/min signify possible Chronic Kidney Disease.    Anion gap 8 5 - 15    ABGS No results for input(s): PHART, PO2ART, TCO2, HCO3 in the last 72 hours.  Invalid input(s): PCO2 CULTURES Recent Results (from the past 240 hour(s))  Urine Culture     Status: Abnormal   Collection Time: 12/02/16  1:52 PM  Result Value Ref Range Status   Specimen Description URINE, CLEAN CATCH  Final   Special Requests NONE  Final   Culture 20,000 COLONIES/mL ENTEROBACTER SPECIES (A)  Final   Report Status 12/05/2016 FINAL  Final   Organism ID, Bacteria ENTEROBACTER SPECIES (A)  Final      Susceptibility   Enterobacter species - MIC*    CEFAZOLIN RESISTANT  Resistant     CEFTRIAXONE <=1 SENSITIVE Sensitive     CIPROFLOXACIN <=0.25 SENSITIVE Sensitive     GENTAMICIN <=1 SENSITIVE Sensitive     IMIPENEM <=0.25 SENSITIVE Sensitive     NITROFURANTOIN 32 SENSITIVE Sensitive     TRIMETH/SULFA <=20 SENSITIVE Sensitive     PIP/TAZO <=4 SENSITIVE Sensitive     * 20,000 COLONIES/mL ENTEROBACTER SPECIES   Studies/Results: No results found.  Medications:  Prior to Admission:  Prescriptions Prior to Admission  Medication Sig Dispense Refill Last Dose  . Ascorbic Acid (VITAMIN C) 1000 MG tablet Take 1,000 mg by mouth 2 (two) times daily.    12/01/2016 at Unknown time  . aspirin EC 325 MG EC tablet Take 1 tablet (325 mg total) by mouth daily with breakfast. 30 tablet 0 12/01/2016 at Unknown time  . diltiazem (DILACOR XR) 120 MG 24 hr capsule Take 120 mg by mouth  every evening.   12/01/2016 at Unknown time  . diphenhydrAMINE (SOMINEX) 25 MG tablet Take 50 mg by mouth at bedtime.    12/01/2016 at Unknown time  . DULoxetine (CYMBALTA) 60 MG capsule Take 60 mg by mouth daily.   12/01/2016 at Unknown time  . ibuprofen (ADVIL,MOTRIN) 200 MG tablet Take 200 mg by mouth daily.   Taking  . Ipratropium-Albuterol (ALBUTEROL-IPRATROPIUM IN) Inhale 1 puff into the lungs 2 (two) times daily.   Taking  . levothyroxine (SYNTHROID, LEVOTHROID) 88 MCG tablet Take 1 tablet (88 mcg total) by mouth daily. 30 tablet 1 12/01/2016 at Unknown time  . metroNIDAZOLE (FLAGYL) 500 MG tablet Take 1 tablet by mouth 2 (two) times daily.   12/02/2016 at Unknown time  . Multiple Vitamin (MULTIVITAMIN WITH MINERALS) TABS Take 1 tablet by mouth daily.   12/01/2016 at Unknown time  . ondansetron (ZOFRAN) 4 MG tablet Take 1 tablet (4 mg total) by mouth every 8 (eight) hours as needed for nausea or vomiting. 40 tablet 0 12/02/2016 at Unknown time  . polyethylene glycol (MIRALAX / GLYCOLAX) packet Take 17 g by mouth 2 (two) times daily. 14 each 0 Taking   Scheduled: . aspirin  325 mg Oral Q breakfast  . diltiazem  120 mg Oral Daily  . DULoxetine  60 mg Oral Daily  . enoxaparin (LOVENOX) injection  40 mg Subcutaneous Q24H  . levothyroxine  88 mcg Oral Daily  . multivitamin with minerals  1 tablet Oral Daily  . tiotropium  18 mcg Inhalation Daily  . vitamin C  1,000 mg Oral BID  . zolpidem  5 mg Oral QHS   Continuous: . 0.9 % NaCl with KCl 20 mEq / L 100 mL/hr at 12/04/16 2135  . ciprofloxacin Stopped (12/05/16 0341)  . famotidine (PEPCID) IV Stopped (12/04/16 0934)  . metronidazole Stopped (12/05/16 8938)   BOF:BPZWCHENIDPOE **OR** acetaminophen, albuterol, fentaNYL (SUBLIMAZE) injection, hydrALAZINE, ondansetron **OR** ondansetron (ZOFRAN) IV  Assesment: She was admitted with colitis. She was dehydrated and has had hyponatremia. Her sodium level was much better yesterday and today is  up to 127. She has chronic weakness but refuses consideration of placement. I think she's ready for discharge. Principal Problem:   Acute colitis Active Problems:   Tobacco use disorder   Weakness   COPD (chronic obstructive pulmonary disease) ([place])   Uncontrolled hypertension   Enteritis   Hyponatremia    Plan: Discharge home today    LOS: 2 days   [name] L 12/05/2016, 8:58 AM

## 2016-12-05 NOTE — Care Management Note (Signed)
Case Management Note  Patient Details  Name: [name] MRN: [id number] Date of Birth: 1936-02-12  Expected Discharge Date:  12/05/16               Expected Discharge Plan:  [place]  In-House Referral:  NA  Discharge planning Services  CM Consult  Post Acute Care Choice:  Home Health, Resumption of Svcs/PTA Provider Choice offered to:  Patient   HH Arranged:  RN, PT, OT [place] Agency:  [facility]  Status of Service:  Completed, signed off   Additional Comments: Discharging home today with resumption of [facility] serivces, aware HH has 48 hrs to resume services. Vaughan Basta, [facility] rep, aware of DC today. Pt expecting [facility] CM to contact her and emmi transition calls at DC Sherald Barge, RN 12/05/2016, 11:58 AM

## 2016-12-08 ENCOUNTER — Other Ambulatory Visit: Payer: Self-pay | Admitting: *Deleted

## 2016-12-08 ENCOUNTER — Emergency Department (HOSPITAL_COMMUNITY)
Admission: EM | Admit: 2016-12-08 | Discharge: 2016-12-08 | Disposition: A | Discharge status: Home / Self Care | Payer: PPO | Attending: Emergency Medicine | Admitting: Emergency Medicine

## 2016-12-08 ENCOUNTER — Encounter (HOSPITAL_COMMUNITY): Payer: Self-pay | Admitting: Emergency Medicine

## 2016-12-08 DIAGNOSIS — K219 Gastro-esophageal reflux disease without esophagitis: Secondary | ICD-10-CM | POA: Diagnosis not present

## 2016-12-08 DIAGNOSIS — Z79891 Long term (current) use of opiate analgesic: Secondary | ICD-10-CM | POA: Diagnosis not present

## 2016-12-08 DIAGNOSIS — I10 Essential (primary) hypertension: Secondary | ICD-10-CM | POA: Diagnosis not present

## 2016-12-08 DIAGNOSIS — Z96652 Presence of left artificial knee joint: Secondary | ICD-10-CM | POA: Diagnosis not present

## 2016-12-08 DIAGNOSIS — I2510 Atherosclerotic heart disease of native coronary artery without angina pectoris: Secondary | ICD-10-CM | POA: Insufficient documentation

## 2016-12-08 DIAGNOSIS — Z8673 Personal history of transient ischemic attack (TIA), and cerebral infarction without residual deficits: Secondary | ICD-10-CM | POA: Insufficient documentation

## 2016-12-08 DIAGNOSIS — R1013 Epigastric pain: Secondary | ICD-10-CM | POA: Insufficient documentation

## 2016-12-08 DIAGNOSIS — Z87891 Personal history of nicotine dependence: Secondary | ICD-10-CM | POA: Diagnosis not present

## 2016-12-08 DIAGNOSIS — Z96612 Presence of left artificial shoulder joint: Secondary | ICD-10-CM | POA: Insufficient documentation

## 2016-12-08 DIAGNOSIS — J449 Chronic obstructive pulmonary disease, unspecified: Secondary | ICD-10-CM | POA: Insufficient documentation

## 2016-12-08 DIAGNOSIS — I739 Peripheral vascular disease, unspecified: Secondary | ICD-10-CM | POA: Diagnosis not present

## 2016-12-08 DIAGNOSIS — R112 Nausea with vomiting, unspecified: Secondary | ICD-10-CM | POA: Diagnosis not present

## 2016-12-08 DIAGNOSIS — Z96651 Presence of right artificial knee joint: Secondary | ICD-10-CM | POA: Insufficient documentation

## 2016-12-08 DIAGNOSIS — F329 Major depressive disorder, single episode, unspecified: Secondary | ICD-10-CM | POA: Diagnosis not present

## 2016-12-08 DIAGNOSIS — Z7982 Long term (current) use of aspirin: Secondary | ICD-10-CM | POA: Insufficient documentation

## 2016-12-08 DIAGNOSIS — Z79899 Other long term (current) drug therapy: Secondary | ICD-10-CM | POA: Diagnosis not present

## 2016-12-08 DIAGNOSIS — E871 Hypo-osmolality and hyponatremia: Secondary | ICD-10-CM | POA: Insufficient documentation

## 2016-12-08 DIAGNOSIS — E039 Hypothyroidism, unspecified: Secondary | ICD-10-CM | POA: Insufficient documentation

## 2016-12-08 DIAGNOSIS — M6282 Rhabdomyolysis: Secondary | ICD-10-CM | POA: Diagnosis not present

## 2016-12-08 DIAGNOSIS — R1010 Upper abdominal pain, unspecified: Secondary | ICD-10-CM | POA: Diagnosis present

## 2016-12-08 DIAGNOSIS — Z4781 Encounter for orthopedic aftercare following surgical amputation: Secondary | ICD-10-CM | POA: Diagnosis not present

## 2016-12-08 DIAGNOSIS — F419 Anxiety disorder, unspecified: Secondary | ICD-10-CM | POA: Diagnosis not present

## 2016-12-08 LAB — URINALYSIS, ROUTINE W REFLEX MICROSCOPIC
BILIRUBIN URINE: NEGATIVE
GLUCOSE, UA: NEGATIVE mg/dL
HGB URINE DIPSTICK: NEGATIVE
KETONES UR: NEGATIVE mg/dL
LEUKOCYTES UA: NEGATIVE
NITRITE: NEGATIVE
PH: 6 (ref 5.0–8.0)
PROTEIN: 100 mg/dL — AB
Specific Gravity, Urine: 1.012 (ref 1.005–1.030)

## 2016-12-08 LAB — COMPREHENSIVE METABOLIC PANEL
ALT: 30 U/L (ref 14–54)
AST: 35 U/L (ref 15–41)
Albumin: 3.7 g/dL (ref 3.5–5.0)
Alkaline Phosphatase: 88 U/L (ref 38–126)
Anion gap: 12 (ref 5–15)
BUN: 12 mg/dL (ref 6–20)
CHLORIDE: 89 mmol/L — AB (ref 101–111)
CO2: 27 mmol/L (ref 22–32)
CREATININE: 0.58 mg/dL (ref 0.44–1.00)
Calcium: 8.9 mg/dL (ref 8.9–10.3)
Glucose, Bld: 161 mg/dL — ABNORMAL HIGH (ref 65–99)
Potassium: 3.3 mmol/L — ABNORMAL LOW (ref 3.5–5.1)
Sodium: 128 mmol/L — ABNORMAL LOW (ref 135–145)
Total Bilirubin: 0.5 mg/dL (ref 0.3–1.2)
Total Protein: 6.9 g/dL (ref 6.5–8.1)

## 2016-12-08 LAB — CBC
HCT: 35.4 % — ABNORMAL LOW (ref 36.0–46.0)
Hemoglobin: 12.2 g/dL (ref 12.0–15.0)
MCH: 31.3 pg (ref 26.0–34.0)
MCHC: 34.5 g/dL (ref 30.0–36.0)
MCV: 90.8 fL (ref 78.0–100.0)
PLATELETS: 513 10*3/uL — AB (ref 150–400)
RBC: 3.9 MIL/uL (ref 3.87–5.11)
RDW: 13.4 % (ref 11.5–15.5)
WBC: 7.6 10*3/uL (ref 4.0–10.5)

## 2016-12-08 LAB — LIPASE, BLOOD: LIPASE: 33 U/L (ref 11–51)

## 2016-12-08 MED ORDER — SUCRALFATE 1 GM/10ML PO SUSP: 1.0000 g | Freq: Three times a day (TID) | ORAL | 0 refills | Status: DC

## 2016-12-08 MED ORDER — FAMOTIDINE IN NACL 20-0.9 MG/50ML-% IV SOLN
20.0000 mg | Freq: Once | INTRAVENOUS | Status: AC
  Administered 2016-12-08: 20 mg via INTRAVENOUS
  Filled 2016-12-08: qty 50

## 2016-12-08 MED ORDER — SUCRALFATE 1 GM/10ML PO SUSP
1.0000 g | Freq: Three times a day (TID) | ORAL | Status: DC
  Administered 2016-12-08: 1 g via ORAL
  Filled 2016-12-08: qty 10

## 2016-12-08 MED ORDER — SODIUM CHLORIDE 0.9 % IV BOLUS (SEPSIS)
1000.0000 mL | Freq: Once | INTRAVENOUS | Status: AC
  Administered 2016-12-08: 1000 mL via INTRAVENOUS

## 2016-12-08 MED ORDER — FAMOTIDINE 20 MG PO TABS: 20.0000 mg | ORAL_TABLET | Freq: Two times a day (BID) | ORAL | 0 refills | Status: DC

## 2016-12-08 NOTE — ED Provider Notes (Signed)
[facility] EMERGENCY DEPARTMENT Provider Note   CSN: [id number] Arrival date & time: 12/08/16  1220     History   Chief Complaint Chief Complaint  Patient presents with  . Abdominal Pain    HPI [name] is a 81 y.o. female.  HPI  Patient presents 3 days after hospital discharge, now with concern for abdominal pain, nausea, vomiting. Patient notes multiple medical issues including COPD, chronic pain, and over the past few weeks has had multiple new acute issues. Patient had left arm surgery, followed by development of nausea, anorexia. Patient presented to this facility about 1 week ago given these ongoing concerns, was admitted. She notes that on discharge she left with multiple new prescriptions including antibiotics, with symptomatic control. Today, she notes that she had increasing pain in her epigastrium radiating bilaterally across the upper abdomen. There is associated nausea, and she has had multiple episodes of vomiting overnight she has been intolerant of medication today.  No new fever, no new dyspnea, no new chest pain. Patient continues to smoke cigarettes.   Past Medical History:  Diagnosis Date  . Anxiety   . Arthritis   . Constipation due to pain medication   . COPD (chronic obstructive pulmonary disease) ([place])   . Depression   . Diverticulosis   . Fibromyalgia    Patient does think so,  [facility] dr said I do  . Gastritis   . GERD (gastroesophageal reflux disease)   . Head injury   . Hypertension   . Hypothyroid   . Hypothyroidism   . Osteoarthritis   . Peripheral vascular disease ([place])    Bilateral subclavian artery disease (status post angioplasty). Also status post left CEA. Status post celiac artery PTA.  Marland Kitchen Pneumonia   . PONV (postoperative nausea and vomiting)    severe n/v after every surgery- DId not get sick after surgery 07/2015  . Shortness of breath dyspnea   . Stroke [facility]) 02/2013   Archie Endo 03/12/2013; memory - at times  . Urgency  incontinence     Patient Active Problem List   Diagnosis Date Noted  . Acute colitis 12/02/2016  . Uncontrolled hypertension 12/02/2016  . Enteritis 12/02/2016  . Hyponatremia 12/02/2016  . Status post total shoulder arthroplasty, left 11/21/2016  . COPD (chronic obstructive pulmonary disease) ([place]) 11/21/2016  . Fall 11/21/2016  . Weakness 11/19/2016  . Rhabdomyolysis 11/19/2016  . DJD of left shoulder 11/15/2016  . Preop cardiovascular exam 11/11/2016  . DOE (dyspnea on exertion) 11/11/2016  . Rotator cuff arthropathy of left shoulder 10/30/2016  . Intertrochanteric fracture of right hip ([place]) 08/01/2015    Class: Acute  . SMA stenosis ([place]) 09/15/2014  . Abdominal pain, right upper quadrant 09/15/2014  . Essential hypertension 09/15/2014  . Depression 09/15/2014  . Hypothyroid 09/15/2014  . GERD without esophagitis 09/15/2014  . Lumbago 11/12/2013  . Stiffness of joint, not elsewhere classified, pelvic region and thigh 11/12/2013  . Muscle weakness (generalized) 11/12/2013  . Lack of coordination 03/21/2013  . Fine motor impairment 03/21/2013  . Left leg weakness 03/17/2013  . Risk for falls 03/17/2013  . Carotid artery disease ([place]) 03/11/2013  . Subclavian artery stenosis ([place]) 03/11/2013  . TIA (transient ischemic attack) 03/11/2013  . Tobacco use disorder 03/11/2013  . CVA (cerebral infarction) 03/05/2013  . Pain in joint, shoulder region 11/01/2010    Past Surgical History:  Procedure Laterality Date  . ANTERIOR AND POSTERIOR REPAIR  12/2000   Archie Endo 07/04/2010  . ANTERIOR CERVICAL DECOMP/DISCECTOMY  FUSION N/A 04/03/2012   Procedure: ANTERIOR CERVICAL DECOMPRESSION/DISCECTOMY FUSION 2 LEVELS;  Surgeon: Hosie Spangle, MD;  Location: [place] [facility]URO ORS;  Service: Neurosurgery;  Laterality: N/A;  Cervical four-five,Cervical five-six anterior cervical decompression with fusion plating and bonegraft  . BACK SURGERY    . BREAST SURGERY Right 02/2007   Archie Endo 06/23/2010    . BUNIONECTOMY WITH HAMMERTOE RECONSTRUCTION Left 09/10/2007   Archie Endo 06/23/2010  . CAROTID STENT Left   . CATARACT EXTRACTION W/ INTRAOCULAR LENS IMPLANT Left    Archie Endo 06/23/2010  . CHOLECYSTECTOMY    . COLONOSCOPY N/A 07/15/2014   Procedure: COLONOSCOPY;  Surgeon: Rogene Houston, MD;  Location: [facility];  Service: Endoscopy;  Laterality: N/A;  200 - moved to 2:10 - Ann to notify pt  . COMPRESSION HIP SCREW Right 08/01/2015   Procedure: COMPRESSION HIP;  Surgeon: Carole Civil, MD;  Location: [facility] ORS;  Service: Orthopedics;  Laterality: Right;  . ESOPHAGOGASTRODUODENOSCOPY N/A 07/15/2014   Procedure: ESOPHAGOGASTRODUODENOSCOPY (EGD);  Surgeon: Rogene Houston, MD;  Location: [facility];  Service: Endoscopy;  Laterality: N/A;  . EYE SURGERY     cataract /lens both eyes  . FRACTURE SURGERY    . INCONTINENCE SURGERY  12/2000   Tension-free transvaginal tape procedure/notes 07/04/2010  . JOINT REPLACEMENT    . LUMBAR FUSION  08/2006   Archie Endo 06/23/2010  . PERIPHERAL VASCULAR CATHETERIZATION N/A 10/08/2014   Procedure: Visceral Angiography;  Surgeon: Algernon Huxley, MD;  Location: [place] CV LAB;  Service: Cardiovascular;  - long stenosis/occlusion of SMA. 75% celiac artery (PTA with 6 mm balloon)  . PERIPHERAL VASCULAR CATHETERIZATION N/A 10/08/2014   Procedure: Visceral Artery Intervention;  Surgeon: Algernon Huxley, MD;  Location: [place] CV LAB;  Service: Cardiovascular;  Laterality: N/A;  . ROTATOR CUFF REPAIR Left X 3  . TOTAL KNEE ARTHROPLASTY Bilateral 2005; 2011   right; left  . TOTAL SHOULDER ARTHROPLASTY Left 11/15/2016  . TOTAL SHOULDER ARTHROPLASTY Left 11/15/2016   Procedure: LEFT TOTAL SHOULDER ARTHROPLASTY;  Surgeon: [name], MD;  Location: [place];  Service: Orthopedics;  Laterality: Left;  Marland Kitchen VAGINAL HYSTERECTOMY  12/2000   Archie Endo 07/04/2010  . VASCULAR SURGERY      OB History    No data available       Home Medications    Prior to Admission  medications   Medication Sig Start Date End Date Taking? Authorizing Provider  ciprofloxacin (CIPRO) 500 MG tablet Take 1 tablet (500 mg total) by mouth 2 (two) times daily. 12/05/16 12/12/16 Yes Sinda Du, MD  diltiazem (DILACOR XR) 120 MG 24 hr capsule Take 120 mg by mouth every evening.   Yes [provider]  DULoxetine (CYMBALTA) 30 MG capsule Take 30 mg by mouth daily.    Yes [provider]  levothyroxine (SYNTHROID, LEVOTHROID) 88 MCG tablet Take 1 tablet (88 mcg total) by mouth daily. 03/12/13  Yes Love, Ivan Anchors, PA-C  metroNIDAZOLE (FLAGYL) 500 MG tablet Take 1 tablet by mouth 2 (two) times daily. 12/01/16  Yes [provider]  ondansetron (ZOFRAN) 4 MG tablet Take 1 tablet (4 mg total) by mouth every 8 (eight) hours as needed for nausea or vomiting. 11/15/16  Yes Aundra Dubin, PA-C  Ascorbic Acid (VITAMIN C) 1000 MG tablet Take 1,000 mg by mouth 2 (two) times daily.     [provider]  aspirin EC 325 MG EC tablet Take 1 tablet (325 mg total) by mouth daily with breakfast.  08/04/15   Rosita Fire, MD  diphenhydrAMINE (SOMINEX) 25 MG tablet Take 50 mg by mouth at bedtime.     [provider]  ibuprofen (ADVIL,MOTRIN) 200 MG tablet Take 200 mg by mouth daily.    [provider]  Ipratropium-Albuterol (ALBUTEROL-IPRATROPIUM IN) Inhale 1 puff into the lungs 2 (two) times daily.    [provider]  Multiple Vitamin (MULTIVITAMIN WITH MINERALS) TABS Take 1 tablet by mouth daily.    [provider]  polyethylene glycol (MIRALAX / GLYCOLAX) packet Take 17 g by mouth 2 (two) times daily. 11/20/16   Murlean Iba, MD    Family History Family History  Problem Relation Age of Onset  . Pancreatic cancer Mother   . Prostate cancer Father     Social History Social History  Substance Use Topics  . Smoking status: Former Smoker    Packs/day: 0.12    Years: 63.00    Types: Cigarettes  . Smokeless tobacco:  Never Used  . Alcohol use No     Allergies   Aleve [naproxen sodium]; Hydrocodone; Statins; Gluten meal; Wheat bran; Celebrex [celecoxib]; Codeine; and Morphine and related   Review of Systems Review of Systems  Constitutional:       Per HPI, otherwise negative  HENT:       Per HPI, otherwise negative  Respiratory:       Per HPI, otherwise negative  Cardiovascular:       Per HPI, otherwise negative  Gastrointestinal: Positive for abdominal pain, nausea and vomiting.  Endocrine:       Negative aside from HPI  Genitourinary:       Neg aside from HPI   Musculoskeletal:       Per HPI, otherwise negative  Skin: Negative.   Neurological: Negative for syncope.     Physical Exam Updated Vital Signs BP (!) 175/73 (BP Location: Right Arm)   Pulse 66   Temp 98.1 F (36.7 C) (Oral)   Resp 20   Ht 5\' 1"  (1.549 m)   Wt 53.5 kg (118 lb)   SpO2 97%   BMI 22.30 kg/m   Physical Exam  Constitutional: She is oriented to person, place, and time. She has a sickly appearance. No distress.    HENT:  Head: Normocephalic and atraumatic.  Eyes: Conjunctivae and EOM are normal.  Cardiovascular: Normal rate and regular rhythm.   Pulmonary/Chest: Effort normal and breath sounds normal. No stridor. No respiratory distress.  Abdominal: She exhibits no distension.    Musculoskeletal: She exhibits no edema.  Neurological: She is alert and oriented to person, place, and time. No cranial nerve deficit.  Skin: Skin is warm and dry.  Psychiatric: She has a normal mood and affect.  Nursing note and vitals reviewed.    ED Treatments / Results  Labs (all labs ordered are listed, but only abnormal results are displayed) Labs Reviewed  COMPREHENSIVE METABOLIC PANEL - Abnormal; Notable for the following:       Result Value   Sodium 128 (*)    Potassium 3.3 (*)    Chloride 89 (*)    Glucose, Bld 161 (*)    All other components within normal limits  CBC - Abnormal; Notable for the  following:    HCT 35.4 (*)    Platelets 513 (*)    All other components within normal limits  URINALYSIS, ROUTINE W REFLEX MICROSCOPIC - Abnormal; Notable for the following:    APPearance HAZY (*)    Protein, ur  100 (*)    Bacteria, UA RARE (*)    Squamous Epithelial / LPF 0-5 (*)    All other components within normal limits  LIPASE, BLOOD   Procedures Procedures (including critical care time)  Medications Ordered in ED Medications  sucralfate (CARAFATE) 1 GM/10ML suspension 1 g (not administered)  famotidine (PEPCID) IVPB 20 mg premix (not administered)  sodium chloride 0.9 % bolus 1,000 mL (1,000 mLs Intravenous New Bag/Given 12/08/16 1640)     Initial Impression / Assessment and Plan / ED Course  I have reviewed the triage vital signs and the nursing notes.  Pertinent labs & imaging results that were available during my care of the patient were reviewed by me and considered in my medical decision making (see chart for details).    Review performed, notable for recent admission.  6:39 PM Patient looks better, states that she feels better. With reviewed all findings at length including evidence for dehydration, mild electrolyte abnormalities. Given the patient's recent hospital vision, discharged with multiple new medication, I reviewed her medication list and she will be discharged on appropriate meds. With presentation for upper abdominal pain, there is some suspicion for gastroesophageal etiology given the otherwise reassuring findings, absence of an acute abdomen on physical exam, and hemodynamic stability. With her substantial improvement she is appropriate for outpatient management, follow-up.   Final Clinical Impressions(s) / ED Diagnoses  Abdominal pain Hyponatremia   Carmin Muskrat, MD 12/08/16 1840

## 2016-12-08 NOTE — ED Notes (Signed)
Pt ambulatory to waiting room. Pt verbalized understanding of discharge instructions.   

## 2016-12-08 NOTE — ED Triage Notes (Signed)
Patient complaining of upper abdominal pain x 1 week and was admitted for same on the 13th. Still complaining of abdominal pain and diarrhea since discharge from hospital 3 days ago.

## 2016-12-08 NOTE — Discharge Summary (Signed)
Physician Discharge Summary  Patient ID: [name] MRN: [id number] DOB/AGE: 28-Mar-1935 81 y.o. Primary Care Physician:Nuel Dejaynes, Percell Miller, MD Admit date: 12/02/2016 Discharge date: 12/08/2016    Discharge Diagnoses:   Principal Problem:   Acute colitis Active Problems:   Tobacco use disorder   Weakness   COPD (chronic obstructive pulmonary disease) (HCC)   Uncontrolled hypertension   Enteritis   Hyponatremia   Allergies as of 12/05/2016      Reactions   Aleve [naproxen Sodium] Hives, Palpitations   Hydrocodone Nausea Only   Statins Other (See Comments)   Muscle pain   Gluten Meal    Wheat Bran    Gluten intolerant   Celebrex [celecoxib] Nausea And Vomiting   Codeine Nausea And Vomiting   Morphine And Related Nausea And Vomiting      Medication List    TAKE these medications   ALBUTEROL-IPRATROPIUM IN Inhale 1 puff into the lungs 2 (two) times daily.   aspirin 325 MG EC tablet Take 1 tablet (325 mg total) by mouth daily with breakfast.   ciprofloxacin 500 MG tablet Commonly known as:  CIPRO Take 1 tablet (500 mg total) by mouth 2 (two) times daily.   diltiazem 120 MG 24 hr capsule Commonly known as:  DILACOR XR Take 120 mg by mouth every evening.   diphenhydrAMINE 25 MG tablet Commonly known as:  SOMINEX Take 50 mg by mouth at bedtime.   DULoxetine 60 MG capsule Commonly known as:  CYMBALTA Take 60 mg by mouth daily.   ibuprofen 200 MG tablet Commonly known as:  ADVIL,MOTRIN Take 200 mg by mouth daily.   levothyroxine 88 MCG tablet Commonly known as:  SYNTHROID, LEVOTHROID Take 1 tablet (88 mcg total) by mouth daily.   metroNIDAZOLE 500 MG tablet Commonly known as:  FLAGYL Take 1 tablet by mouth 2 (two) times daily.   multivitamin with minerals Tabs tablet Take 1 tablet by mouth daily.   ondansetron 4 MG tablet Commonly known as:  ZOFRAN Take 1 tablet (4 mg total) by mouth every 8 (eight) hours as needed for nausea or vomiting.    polyethylene glycol packet Commonly known as:  MIRALAX / GLYCOLAX Take 17 g by mouth 2 (two) times daily.   vitamin C 1000 MG tablet Take 1,000 mg by mouth 2 (two) times daily.       Discharged Condition:Improved    Consults: None  Significant Diagnostic Studies: Dg Chest 1 View  Result Date: 11/18/2016 CLINICAL DATA:  [place] onto the bathroom floor today. EXAM: CHEST 1 VIEW COMPARISON:  Left shoulder radiographs obtained today. Chest and right rib radiographs dated 07/18/2016. FINDINGS: Normal sized heart. Aortic calcifications. Clear lungs. Old, healed right fifth lateral rib fracture. The previously demonstrated acute right posterior fifth rib fracture is not currently visualized. Probable callus formation associated with an old left posterior tenth rib fracture. Superior migration of the right humeral head with extensive spur formation. IMPRESSION: 1. No acute abnormality. 2. Marked right glenohumeral joint degenerative changes with evidence of a large, chronic right rotator cuff tear. Electronically Signed   By: Claudie Revering M.D.   On: 11/18/2016 19:24   Ct Head Wo Contrast  Result Date: 11/18/2016 CLINICAL DATA:  Forehead hematoma after falling and hitting her head on the bathroom floor today. EXAM: CT HEAD WITHOUT CONTRAST TECHNIQUE: Contiguous axial images were obtained from the base of the skull through the vertex without intravenous contrast. COMPARISON:  02/28/2013. FINDINGS: Brain: Diffusely enlarged ventricles and subarachnoid spaces. Patchy white matter  low density in both cerebral hemispheres. No intracranial hemorrhage, mass lesion or CT evidence of acute infarction. Vascular: No hyperdense vessel or unexpected calcification. Skull: Normal. Negative for fracture or focal lesion. Sinuses/Orbits: Status post bilateral cataract extraction. A small metallic structure is again demonstrated at the superior medial aspect of the left globe. Normally pneumatized paranasal sinuses.  Other: None. IMPRESSION: 1. No acute abnormality. 2. Mildly progressive diffuse cerebral and cerebellar atrophy. 3. Marked diffuse chronic small vessel white matter ischemic changes in both cerebral hemispheres and in the cerebellum, with progression. 4. Stable small metallic foreign body in the superior medial margin of the left globe. This could represent a surgically placed object or a foreign body. This would preclude MRI unless this can be proven MR safe. Electronically Signed   By: Claudie Revering M.D.   On: 11/18/2016 19:17   Ct Abdomen Pelvis W Contrast  Result Date: 12/02/2016 CLINICAL DATA:  Upper abdominal pain radiating into lower back. 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 EXAM: CT ABDOMEN AND PELVIS WITH CONTRAST TECHNIQUE: Multidetector CT imaging of the abdomen and pelvis was performed using the standard protocol following bolus administration of intravenous contrast. CONTRAST:  151mL ISOVUE-300 IOPAMIDOL (ISOVUE-300) INJECTION 61% COMPARISON:  Prior CTA of the abdomen and pelvis on 10/22/2014 FINDINGS: Lower chest: No acute abnormality. Hepatobiliary: No focal liver abnormality is seen. Status post cholecystectomy. No biliary dilatation. Pancreas: Unremarkable. No pancreatic ductal dilatation or surrounding inflammatory changes. Spleen: Normal in size without focal abnormality. Adrenals/Urinary Tract: Scattered nonobstructing small bilateral renal calculi. Stomach/Bowel: No evidence of bowel obstruction or free intraperitoneal air. There are multiple fluid filled small bowel loops which appear mildly hyperemic and top-normal in size. This is nonspecific but may be secondary to enteritis. There also is suggestion of potentially mild colonic wall thickening beginning at the level of the mid transverse colon and extending into the proximal sigmoid colon. This may also implicate a component of colitis. There is a large amount of fecal material in the rectum. Sigmoid  diverticulosis present without evidence of acute diverticulitis. No bowel lesions or evidence of focal abscess. 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 The celiac axis and inferior mesenteric artery remain open. Reproductive: Status post hysterectomy. No adnexal masses. Other: No abdominal wall hernia or abnormality. No abdominopelvic ascites. Musculoskeletal: Stable advanced degenerative disc disease of the lumbar spine with chronic compression of the L1 vertebral body. IMPRESSION: 1. Possible small bowel all enteritis as well as component of potential segmental colitis involving the distal transverse and descending colon. No evidence of bowel perforation or focal abscess. 2. 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 Electronically Signed   By: Aletta Edouard M.D.   On: 12/02/2016 13:34   US Carotid Duplex Bilateral  Result Date: 11/13/2016 CLINICAL DATA:  81 year old female with a history of carotid disease. Cardiovascular risk factors include hypertension, known prior stroke/ TIA, known coronary disease, known vascular disease with prior carotid stenting, hyperlipidemia, tobacco use EXAM: BILATERAL CAROTID DUPLEX ULTRASOUND TECHNIQUE: Pearline Cables scale imaging, color Doppler and duplex ultrasound were performed of bilateral carotid and vertebral arteries in the neck. COMPARISON:  Duplex 10/22/2010 FINDINGS: Criteria: Quantification of carotid stenosis is based on velocity parameters that correlate the residual internal carotid diameter with NASCET-based stenosis levels, using the diameter of the distal internal carotid lumen as the denominator for stenosis  measurement. The following velocity measurements were obtained: RIGHT ICA:  Systolic 716 cm/sec, Diastolic 26 cm/sec CCA:  967 cm/sec SYSTOLIC ICA/CCA  RATIO:  1.1 ECA:  90 cm/sec LEFT ICA:  Systolic 893 cm/sec, Diastolic 28 cm/sec CCA:  98 cm/sec SYSTOLIC ICA/CCA RATIO:  1.7 ECA:  295 cm/sec Right Brachial SBP: Not acquired Left Brachial SBP: Not acquired RIGHT CAROTID ARTERY: Calcifications of the right common carotid artery. Intermediate waveform maintained. Heterogeneous and partially calcified plaque at the right carotid bifurcation. No significant lumen shadowing. Low resistance waveform of the right ICA. Mild tortuosity RIGHT VERTEBRAL ARTERY: Antegrade flow with low resistance waveform. LEFT CAROTID ARTERY: No evidence of stenting of the left carotid system. Calcification of the left common carotid artery. Intermediate waveform maintained. Heterogeneous and partially calcified plaque at the left carotid bifurcation with lumen shadowing. Low resistance waveform of the left ICA. Mild tortuosity LEFT VERTEBRAL ARTERY:  Antegrade flow with low resistance waveform. IMPRESSION: Right: Color duplex indicates moderate heterogeneous and calcified plaque, with no hemodynamically significant stenosis by duplex criteria in the extracranial cerebrovascular circulation. Left: Color duplex indicates moderate heterogeneous and calcified plaque, with 50%- 69% stenosis by established duplex criteria. Note that the flow velocities of the left ICA were obtained from an area distal to the maximum narrowing due to the presence of anterior wall plaque with shadowing and may be underestimating the percentage of ICA stenosis. If there is concern for more precise evaluation of degree of stenosis, consider either formal cerebral angiogram, or alternatively, CT angiogram. Signed, Dulcy Fanny. Earleen Newport, DO [facility] Electronically Signed   By: Corrie Mckusick D.O.   On: 11/13/2016 13:26   Dg Shoulder Left  Result Date: 11/18/2016 CLINICAL DATA:  Left shoulder pain following a fall onto the bathroom floor today. EXAM: LEFT  SHOULDER - 2+ VIEW COMPARISON:  11/15/2016. FINDINGS: Stable left shoulder prosthesis. No fracture or dislocation seen. Cervical spine fixation hardware. IMPRESSION: No fracture or dislocation. Electronically Signed   By: Claudie Revering M.D.   On: 11/18/2016 19:18   Dg Shoulder Left Port  Result Date: 11/15/2016 CLINICAL DATA:  Status post left shoulder replaced EXAM: LEFT SHOULDER - 1 VIEW COMPARISON:  None. FINDINGS: Left shoulder replacement is identified without malalignment. Visualized lung fields and left ribs are normal P IMPRESSION: Left shoulder replacement without malalignment. Electronically Signed   By: Abelardo Diesel M.D.   On: 11/15/2016 11:42   Dg Hip Unilat With Pelvis 2-3 Views Left  Result Date: 11/18/2016 CLINICAL DATA:  Left hip pain following a fall onto the bathroom floor today. EXAM: DG HIP (WITH OR WITHOUT PELVIS) 2-3V LEFT COMPARISON:  None. FINDINGS: Diffuse osteopenia. Hardware fixation of the proximal right femur. No fracture or dislocation seen. Lower lumbar spine degenerative changes. Atheromatous arterial calcifications. IMPRESSION: No fracture or dislocation. Electronically Signed   By: Claudie Revering M.D.   On: 11/18/2016 19:19    Lab Results: Basic Metabolic Panel: No results for input(s): NA, K, CL, CO2, GLUCOSE, BUN, CREATININE, CALCIUM, MG, PHOS in the last 72 hours. Liver Function Tests: No results for input(s): AST, ALT, ALKPHOS, BILITOT, PROT, ALBUMIN in the last 72 hours.   CBC: No results for input(s): WBC, NEUTROABS, HGB, HCT, MCV, PLT in the last 72 hours.  Recent Results (from the past 240 hour(s))  Urine Culture     Status: Abnormal   Collection Time: 12/02/16  1:52 PM  Result Value Ref Range Status   Specimen Description URINE, CLEAN CATCH  Final   Special Requests NONE  Final   Culture 20,000 COLONIES/mL ENTEROBACTER SPECIES (A)  Final   Report Status 12/05/2016 FINAL  Final   Organism ID, Bacteria ENTEROBACTER SPECIES (A)  Final       Susceptibility   Enterobacter species - MIC*    CEFAZOLIN RESISTANT Resistant     CEFTRIAXONE <=1 SENSITIVE Sensitive     CIPROFLOXACIN <=0.25 SENSITIVE Sensitive     GENTAMICIN <=1 SENSITIVE Sensitive     IMIPENEM <=0.25 SENSITIVE Sensitive     NITROFURANTOIN 32 SENSITIVE Sensitive     TRIMETH/SULFA <=20 SENSITIVE Sensitive     PIP/TAZO <=4 SENSITIVE Sensitive     * 20,000 COLONIES/mL ENTEROBACTER SPECIES     Hospital Course: This is a 81 year old who I had seen in my office 2 days prior to admission with what appeared to be a gastroenteritis. She was treated with Flagyl and oral fluids and clear liquid diet but she continued to have difficulty with her abdomen. She was brought to the emergency room because of that. In the emergency room she appeared to be somewhat dehydrated and she was given IV fluids and given IV antibiotics. She improved over the next several days. She was able to eat solid food had no diarrhea no nausea and was back at baseline.  Discharge Exam: Blood pressure 129/67, pulse 74, temperature 98.3 F (36.8 C), temperature source Oral, resp. rate 18, height 5\' 1"  (1.549 m), weight 53.5 kg (118 lb), SpO2 98 %. She is awake and alert. Chest is clear. Abdomen is soft.  Disposition: Home with home health services. Her family wants her to have an outpatient consultation with a neurologist to see if she is developing dementia  Discharge Instructions    Diet - low sodium heart healthy    Complete by:  As directed    Face-to-face encounter (required for Medicare/Medicaid patients)    Complete by:  As directed    I Rogelio Winbush L certify that this patient is under my care and that I, or a nurse practitioner or physician's assistant working with me, had a face-to-face encounter that meets the physician face-to-face encounter requirements with this patient on 12/05/2016. The encounter with the patient was in whole, or in part for the following medical condition(s) which is the  primary reason for home health care (List medical condition): Colitis/weakness   The encounter with the patient was in whole, or in part, for the following medical condition, which is the primary reason for home health care:  Colitis/weakness   I certify that, based on my findings, the following services are medically necessary home health services:   Nursing Physical therapy     Reason for Medically Necessary Home Health Services:  Skilled Nursing- Change/Decline in Patient Status   My clinical findings support the need for the above services:  Unable to leave home safely without assistance and/or assistive device   Further, I certify that my clinical findings support that this patient is homebound due to:  Unable to leave home safely without assistance   Home Health    Complete by:  As directed    To provide the following care/treatments:   PT RN     Increase activity slowly    Complete by:  As directed       Follow-up Information    Sinda Du, MD Follow up in 1 week(s).   Specialty:  Pulmonary Disease Contact information: [address] [place] [address] [phone]  Signed: Jaqualyn Juday L   12/08/2016, 8:52 AM

## 2016-12-08 NOTE — Patient Outreach (Signed)
[facility]) Care Management  12/08/2016  [name] 04/23/1935 [id number]  I attempted to contact [name] by phone today but was unable to reach her. I see that she is now in the ED presenting with complaint of abdominal pain and diarrhea.   Plan: I will follow [name]'s ED course closely and if she discharges will call her either this afternoon or Monday. I can see [name] at home on Monday if she discharges.    [facility] Management  [phone]

## 2016-12-11 ENCOUNTER — Other Ambulatory Visit: Payer: Self-pay | Admitting: *Deleted

## 2016-12-11 NOTE — Patient Outreach (Signed)
[facility]) Care Management  12/11/2016  AVERLY ERICSON 1935/11/29 [id number]  Mrs. Schulte was discharged to home from [facility] on 11/25/16. Mrs. Grillot has a medical history which includes CHF, HTN, and CVA. She is reported to live alone and history of falls. She is active with [facility]. Mrs. Pereida was referred to [facility] by Dr. Sinda Du. I have been unable to reach Mrs. Goldberger by phone over the last week and noted that she presented to the ED as late as 12/08/16.   Plan: I will continue to reach out to Mrs. Memon by phone as requested.    [facility] Management  [phone]

## 2016-12-12 DIAGNOSIS — K219 Gastro-esophageal reflux disease without esophagitis: Secondary | ICD-10-CM | POA: Diagnosis not present

## 2016-12-12 DIAGNOSIS — Z79891 Long term (current) use of opiate analgesic: Secondary | ICD-10-CM | POA: Diagnosis not present

## 2016-12-12 DIAGNOSIS — I10 Essential (primary) hypertension: Secondary | ICD-10-CM | POA: Diagnosis not present

## 2016-12-12 DIAGNOSIS — M6282 Rhabdomyolysis: Secondary | ICD-10-CM | POA: Diagnosis not present

## 2016-12-12 DIAGNOSIS — F419 Anxiety disorder, unspecified: Secondary | ICD-10-CM | POA: Diagnosis not present

## 2016-12-12 DIAGNOSIS — Z7982 Long term (current) use of aspirin: Secondary | ICD-10-CM | POA: Diagnosis not present

## 2016-12-12 DIAGNOSIS — J449 Chronic obstructive pulmonary disease, unspecified: Secondary | ICD-10-CM | POA: Diagnosis not present

## 2016-12-12 DIAGNOSIS — Z96612 Presence of left artificial shoulder joint: Secondary | ICD-10-CM | POA: Diagnosis not present

## 2016-12-12 DIAGNOSIS — I739 Peripheral vascular disease, unspecified: Secondary | ICD-10-CM | POA: Diagnosis not present

## 2016-12-12 DIAGNOSIS — Z4781 Encounter for orthopedic aftercare following surgical amputation: Secondary | ICD-10-CM | POA: Diagnosis not present

## 2016-12-12 DIAGNOSIS — F329 Major depressive disorder, single episode, unspecified: Secondary | ICD-10-CM | POA: Diagnosis not present

## 2016-12-13 ENCOUNTER — Other Ambulatory Visit: Payer: Self-pay | Admitting: *Deleted

## 2016-12-13 ENCOUNTER — Encounter (INDEPENDENT_AMBULATORY_CARE_PROVIDER_SITE_OTHER): Payer: Self-pay | Admitting: Internal Medicine

## 2016-12-13 ENCOUNTER — Ambulatory Visit (INDEPENDENT_AMBULATORY_CARE_PROVIDER_SITE_OTHER): Payer: PPO | Admitting: Internal Medicine

## 2016-12-13 VITALS — BP 164/60 | HR 72 | Temp 97.5°F | Ht 61.5 in | Wt 119.0 lb

## 2016-12-13 DIAGNOSIS — K219 Gastro-esophageal reflux disease without esophagitis: Secondary | ICD-10-CM

## 2016-12-13 MED ORDER — PANTOPRAZOLE SODIUM 40 MG PO TBEC: 40.0000 mg | DELAYED_RELEASE_TABLET | Freq: Every day | ORAL | 3 refills | Status: DC

## 2016-12-13 MED ORDER — SUCRALFATE 1 G PO TABS: 1.0000 g | ORAL_TABLET | Freq: Three times a day (TID) | ORAL | 3 refills | Status: DC

## 2016-12-13 NOTE — Patient Instructions (Signed)
Rx for Protonix sent to her pharmacy. Continue the Carafate.

## 2016-12-13 NOTE — Patient Outreach (Signed)
[facility]) Care Management  12/13/2016  [name] [birth year] [id number]  Unable to reach [name] by phone today. I left a HIPPA compliant voice message requesting a return call.   Plan: I will continue attempts to reach [name] by phone.    [facility] [place] Management  [phone]

## 2016-12-13 NOTE — Progress Notes (Signed)
Subjective:    Patient ID: [name], female    DOB: [birth year], 82 y.o.   MRN: [id number] [name]) is present in room.  HPI Presents today with c/o epigastric pain. Seen in the ED last Friday (12/07/2016). Evaluated and started on Carafate.  The Carafate liquid has helped. Taking 4 times a day.  Epigastric pain for about 2 weeks.  Appetite is not good since her surgery.   Hip replacement last year (June of 2017) for a fx.   She says she cannot stand the sight of food. She has a BM daily. No melena or BRRB.  No NSAIDs except for a ASA 325mg .  No nausea or vomiting. No fever.   Her last EGD/Colonoscopy was May of 2016:  EGD findings; No evidence of active PUD. Two antral scars indicative of healed ulcers. Antral gastritis with prominent fold. Biopsy taken.  Colonoscopy findings; Two small cecal polyps ablated via cold biopsy and submitted together. Two cecal and four ascending colon AV malformation without stigmata of bleed. Moderate number of diverticula at sigmoid colon. Hx of COPD, chronic pain.  Review of Systems Past Medical History:  Diagnosis Date  . Anxiety   . Arthritis   . Constipation due to pain medication   . COPD (chronic obstructive pulmonary disease) ([place])   . Depression   . Diverticulosis   . Fibromyalgia    Patient does think so,  [facility] dr said I do  . Gastritis   . GERD (gastroesophageal reflux disease)   . Head injury   . Hypertension   . Hypothyroid   . Hypothyroidism   . Osteoarthritis   . Peripheral vascular disease ([place])    Bilateral subclavian artery disease (status post angioplasty). Also status post left CEA. Status post celiac artery PTA.  Marland Kitchen Pneumonia   . PONV (postoperative nausea and vomiting)    severe n/v after every surgery- DId not get sick after surgery 07/2015  . Shortness of breath dyspnea   . Stroke [facility]) 02/2013   Archie Endo 03/12/2013; memory - at times  . Urgency incontinence     Past Surgical History:  Procedure  Laterality Date  . ANTERIOR AND POSTERIOR REPAIR  12/2000   Archie Endo 07/04/2010  . ANTERIOR CERVICAL DECOMP/DISCECTOMY FUSION N/A 04/03/2012   Procedure: ANTERIOR CERVICAL DECOMPRESSION/DISCECTOMY FUSION 2 LEVELS;  Surgeon: Hosie Spangle, MD;  Location: [place] [facility]URO ORS;  Service: Neurosurgery;  Laterality: N/A;  Cervical four-five,Cervical five-six anterior cervical decompression with fusion plating and bonegraft  . BACK SURGERY    . BREAST SURGERY Right 02/2007   Archie Endo 06/23/2010  . BUNIONECTOMY WITH HAMMERTOE RECONSTRUCTION Left 09/10/2007   Archie Endo 06/23/2010  . CAROTID STENT Left   . CATARACT EXTRACTION W/ INTRAOCULAR LENS IMPLANT Left    Archie Endo 06/23/2010  . CHOLECYSTECTOMY    . COLONOSCOPY N/A 07/15/2014   Procedure: COLONOSCOPY;  Surgeon: Rogene Houston, MD;  [facility];  Service: Endoscopy;  Laterality: N/A;  200 - moved to 2:10 - Ann to notify pt  . COMPRESSION HIP SCREW Right 08/01/2015   Procedure: COMPRESSION HIP;  Surgeon: Carole Civil, MD;  Location: [facility] OR[facility];  Service: Orthopedics;  Laterality: Right;  . ESOPHAGOGASTRODUODENOSCOPY N/A 07/15/2014   Procedure: ESOPHAGOGASTRODUODENOSCOPY (EGD);  Surgeon: Rogene Houston, MD;  [facility];  Service: Endoscopy;  Laterality: N/A;  . EYE SURGERY     cataract /lens both eyes  . FRACTURE SURGERY    . INCONTINENCE SURGERY  12/2000   Tension-free transvaginal  tape procedure/notes 07/04/2010  . JOINT REPLACEMENT    . LUMBAR FUSION  08/2006   Archie Endo 06/23/2010  . PERIPHERAL VASCULAR CATHETERIZATION N/A 10/08/2014   Procedure: Visceral Angiography;  Surgeon: Algernon Huxley, MD;  Location: [place] CV LAB;  Service: Cardiovascular;  - long stenosis/occlusion of SMA. 75% celiac artery (PTA with 6 mm balloon)  . PERIPHERAL VASCULAR CATHETERIZATION N/A 10/08/2014   Procedure: Visceral Artery Intervention;  Surgeon: Algernon Huxley, MD;  Location: [place] CV LAB;  Service: Cardiovascular;  Laterality: N/A;  . ROTATOR CUFF  REPAIR Left X 3  . TOTAL KNEE ARTHROPLASTY Bilateral 2005; 2011   right; left  . TOTAL SHOULDER ARTHROPLASTY Left 11/15/2016  . TOTAL SHOULDER ARTHROPLASTY Left 11/15/2016   Procedure: LEFT TOTAL SHOULDER ARTHROPLASTY;  Surgeon: [name], MD;  Location: [place];  Service: Orthopedics;  Laterality: Left;  Marland Kitchen VAGINAL HYSTERECTOMY  12/2000   Archie Endo 07/04/2010  . VASCULAR SURGERY      Allergies  Allergen Reactions  . Aleve [Naproxen Sodium] Hives and Palpitations  . Hydrocodone Nausea Only  . Statins Other (See Comments)    Muscle pain  . Gluten Meal   . Wheat Bran     Gluten intolerant  . Celebrex [Celecoxib] Nausea And Vomiting  . Codeine Nausea And Vomiting  . Morphine And Related Nausea And Vomiting    Current Outpatient Prescriptions on File Prior to Visit  Medication Sig Dispense Refill  . Ascorbic Acid (VITAMIN C) 1000 MG tablet Take 1,000 mg by mouth 2 (two) times daily.     Marland Kitchen aspirin EC 325 MG EC tablet Take 1 tablet (325 mg total) by mouth daily with breakfast. 30 tablet 0  . diltiazem (DILACOR XR) 120 MG 24 hr capsule Take 120 mg by mouth every evening.    . DULoxetine (CYMBALTA) 30 MG capsule Take 30 mg by mouth daily.     . famotidine (PEPCID) 20 MG tablet Take 1 tablet (20 mg total) by mouth 2 (two) times daily. 30 tablet 0  . levothyroxine (SYNTHROID, LEVOTHROID) 88 MCG tablet Take 1 tablet (88 mcg total) by mouth daily. 30 tablet 1  . Multiple Vitamin (MULTIVITAMIN WITH MINERALS) TABS Take 1 tablet by mouth daily.    . ondansetron (ZOFRAN) 4 MG tablet Take 1 tablet (4 mg total) by mouth every 8 (eight) hours as needed for nausea or vomiting. 40 tablet 0  . polyethylene glycol (MIRALAX / GLYCOLAX) packet Take 17 g by mouth 2 (two) times daily. (Patient taking differently: Take 17 g by mouth daily as needed for mild constipation or moderate constipation. ) 14 each 0  . sucralfate (CARAFATE) 1 GM/10ML suspension Take 10 mLs (1 g total) by mouth 4 (four) times daily -   with meals and at bedtime. Please use three times daily for the next five days. 420 mL 0  . diphenhydrAMINE (SOMINEX) 25 MG tablet Take 50 mg by mouth at bedtime.      No current facility-administered medications on file prior to visit.         Objective:   Physical Exam Blood pressure (!) 164/60, pulse 72, temperature (!) 97.5 F (36.4 C), height 5' 1.5" (1.562 m), weight 119 lb (54 kg).  Alert and oriented. Skin warm and dry. Oral mucosa is moist.   . Sclera anicteric, conjunctivae is pink. Thyroid not enlarged. No cervical lymphadenopathy. Lungs clear. Heart regular rate and rhythm.  Abdomen is soft. Bowel sounds are positive. No hepatomegaly. No abdominal masses felt.  No tenderness.  No edema to lower extremities.          Assessment & Plan:  GERD. Will start her on Protonix 40mg  daily. Stop the Pepcid Continue the Carafate liquid. Will call in Carafate pills and she may crush them and mix with water if these are cheaper for her. Please call me with a PR report. OV in 3 months.

## 2016-12-14 DIAGNOSIS — F329 Major depressive disorder, single episode, unspecified: Secondary | ICD-10-CM | POA: Diagnosis not present

## 2016-12-14 DIAGNOSIS — Z4781 Encounter for orthopedic aftercare following surgical amputation: Secondary | ICD-10-CM | POA: Diagnosis not present

## 2016-12-14 DIAGNOSIS — Z7982 Long term (current) use of aspirin: Secondary | ICD-10-CM | POA: Diagnosis not present

## 2016-12-14 DIAGNOSIS — F419 Anxiety disorder, unspecified: Secondary | ICD-10-CM | POA: Diagnosis not present

## 2016-12-14 DIAGNOSIS — Z96612 Presence of left artificial shoulder joint: Secondary | ICD-10-CM | POA: Diagnosis not present

## 2016-12-14 DIAGNOSIS — M6282 Rhabdomyolysis: Secondary | ICD-10-CM | POA: Diagnosis not present

## 2016-12-14 DIAGNOSIS — K219 Gastro-esophageal reflux disease without esophagitis: Secondary | ICD-10-CM | POA: Diagnosis not present

## 2016-12-14 DIAGNOSIS — J449 Chronic obstructive pulmonary disease, unspecified: Secondary | ICD-10-CM | POA: Diagnosis not present

## 2016-12-14 DIAGNOSIS — Z79891 Long term (current) use of opiate analgesic: Secondary | ICD-10-CM | POA: Diagnosis not present

## 2016-12-14 DIAGNOSIS — I739 Peripheral vascular disease, unspecified: Secondary | ICD-10-CM | POA: Diagnosis not present

## 2016-12-14 DIAGNOSIS — I10 Essential (primary) hypertension: Secondary | ICD-10-CM | POA: Diagnosis not present

## 2016-12-19 ENCOUNTER — Other Ambulatory Visit: Payer: Self-pay | Admitting: *Deleted

## 2016-12-19 NOTE — Patient Outreach (Signed)
[facility]) Care Management  12/19/2016  [name] [birth year] [id number]  Referral received ON 11/28/16 from our telephonic case management team on Mrs. [name] who was referred to [facility] by Dr. Sinda [name]. I have been unable to reach [name] by phone over the last week.   [name] was discharged to home from [facility] on 11/25/16. [name] has a medical history which includes CHF, HTN, and CVA. She is reported to live alone and history of falls. She is active with [facility].   On 12/02/16, [name] was admitted to the hospital with colitis.    I reached out to [name] by phone today and confirmed that she received my previous voice message but hadn't felt well or been able to return my call.   We discussed the following re: her progress.   Colitis signs and symptoms - [name] says her GI condition has improved overall although she has experienced "dumping diarrhea" on at least one occasion since discharge from the hospital. She feels this was related to use of prescribed Carafate.  Appetite/Oral Intake -  [name] says her appetite is somewhat improved but still "not 100%". She is experiencing dulled taste in general. She is using Boost nutrition supplement intermittently and has been making "smoothies with protein powder" when she doesn't feel like eating.   I have prescribed Emmi dietary literature for [name] and will have it mailed to her home.   Recommendation: [name] may benefit from referral to dietician for nutrition evaluation and support.   Medication Management - [name] has decreased her Carafate dose from 4 times daily to once daily as she feels Carafate QID was contributing to GI disturbance and diarrhea.   I will notify Dr. Luan Pulling of this change in prescribed medication regimen.   Home Health Services - [name] confirms that her home health nurse continues to visit twice  weekly.   Plan: I will follow up with [name] again next week by phone. She wishes to maintain phone contact while her [facility] visits continue and would like for me to come see her when her [facility] is no longer making visits.   THN CM Care Plan Problem One     Most Recent Value  Care Plan Problem One  Knowledge Deficits related to acute health condition (colitis)  Role Documenting the Problem One  Care Management [place] for Problem One  Active  THN Long Term Goal   Over the next 60 days, patient will verbalize understanding of plan of care for long term management of colitis  THN Long Term Goal Start Date  12/19/16  Interventions for Problem One Long Term Goal  Discussed need for esetablishment of long term plan of care and patient driven goals for management of colitis  THN CM Short Term Goal #1   Over the next 10 days, patient will attend all scheduled provider appointments  THN CM Short Term Goal #1 Start Date  12/19/16  Interventions for Short Term Goal #1  reviewed upcoming provider appointment schedule and ensured that patient has transportation  [facility] CM Short Term Goal #2   Over the next 30 days, patient will verbalize understaning of signs and symptms of worsening condition and when to call provider or [facility]  THN CM Short Term Goal #2 Start Date  12/19/16  Interventions for Short Term Goal #2  REviewed signs and symptoms of worsening condition with patient  and discussed when to call provider to report,  prescribed Emmi educaitonal materials  THN CM Short Term Goal #3  Over the next 30 days, patient will adhere to bland, cardiac prudent diet  THN CM Short Term Goal #3 Start Date  12/19/16  Interventions for Short Tern Goal #3  Reviewed prescribed diet with patient,  prescribed Emmi Educational Materials,  provided nutrition support print [place] Care Management  [phone]

## 2016-12-20 DIAGNOSIS — M6282 Rhabdomyolysis: Secondary | ICD-10-CM | POA: Diagnosis not present

## 2016-12-20 DIAGNOSIS — J449 Chronic obstructive pulmonary disease, unspecified: Secondary | ICD-10-CM | POA: Diagnosis not present

## 2016-12-20 DIAGNOSIS — I10 Essential (primary) hypertension: Secondary | ICD-10-CM | POA: Diagnosis not present

## 2016-12-20 DIAGNOSIS — Z7982 Long term (current) use of aspirin: Secondary | ICD-10-CM | POA: Diagnosis not present

## 2016-12-20 DIAGNOSIS — I739 Peripheral vascular disease, unspecified: Secondary | ICD-10-CM | POA: Diagnosis not present

## 2016-12-20 DIAGNOSIS — F419 Anxiety disorder, unspecified: Secondary | ICD-10-CM | POA: Diagnosis not present

## 2016-12-20 DIAGNOSIS — Z96612 Presence of left artificial shoulder joint: Secondary | ICD-10-CM | POA: Diagnosis not present

## 2016-12-20 DIAGNOSIS — Z4781 Encounter for orthopedic aftercare following surgical amputation: Secondary | ICD-10-CM | POA: Diagnosis not present

## 2016-12-20 DIAGNOSIS — K219 Gastro-esophageal reflux disease without esophagitis: Secondary | ICD-10-CM | POA: Diagnosis not present

## 2016-12-20 DIAGNOSIS — Z79891 Long term (current) use of opiate analgesic: Secondary | ICD-10-CM | POA: Diagnosis not present

## 2016-12-20 DIAGNOSIS — F329 Major depressive disorder, single episode, unspecified: Secondary | ICD-10-CM | POA: Diagnosis not present

## 2016-12-27 DIAGNOSIS — Z96649 Presence of unspecified artificial hip joint: Secondary | ICD-10-CM | POA: Diagnosis not present

## 2016-12-27 DIAGNOSIS — I739 Peripheral vascular disease, unspecified: Secondary | ICD-10-CM | POA: Diagnosis not present

## 2016-12-27 DIAGNOSIS — G8929 Other chronic pain: Secondary | ICD-10-CM | POA: Diagnosis not present

## 2016-12-27 DIAGNOSIS — F17210 Nicotine dependence, cigarettes, uncomplicated: Secondary | ICD-10-CM | POA: Diagnosis not present

## 2016-12-27 DIAGNOSIS — K551 Chronic vascular disorders of intestine: Secondary | ICD-10-CM | POA: Diagnosis not present

## 2016-12-27 DIAGNOSIS — E038 Other specified hypothyroidism: Secondary | ICD-10-CM | POA: Diagnosis not present

## 2016-12-27 DIAGNOSIS — R296 Repeated falls: Secondary | ICD-10-CM | POA: Diagnosis not present

## 2016-12-27 DIAGNOSIS — I7389 Other specified peripheral vascular diseases: Secondary | ICD-10-CM | POA: Diagnosis not present

## 2016-12-27 DIAGNOSIS — J449 Chronic obstructive pulmonary disease, unspecified: Secondary | ICD-10-CM | POA: Diagnosis not present

## 2016-12-27 DIAGNOSIS — Z66 Do not resuscitate: Secondary | ICD-10-CM | POA: Diagnosis not present

## 2016-12-27 DIAGNOSIS — I774 Celiac artery compression syndrome: Secondary | ICD-10-CM | POA: Diagnosis not present

## 2016-12-27 DIAGNOSIS — Z720 Tobacco use: Secondary | ICD-10-CM | POA: Diagnosis not present

## 2016-12-27 DIAGNOSIS — K589 Irritable bowel syndrome without diarrhea: Secondary | ICD-10-CM | POA: Diagnosis not present

## 2016-12-27 DIAGNOSIS — E039 Hypothyroidism, unspecified: Secondary | ICD-10-CM | POA: Diagnosis not present

## 2016-12-27 DIAGNOSIS — R197 Diarrhea, unspecified: Secondary | ICD-10-CM | POA: Diagnosis not present

## 2016-12-27 DIAGNOSIS — F419 Anxiety disorder, unspecified: Secondary | ICD-10-CM | POA: Diagnosis not present

## 2016-12-27 DIAGNOSIS — M6282 Rhabdomyolysis: Secondary | ICD-10-CM | POA: Diagnosis not present

## 2016-12-27 DIAGNOSIS — F339 Major depressive disorder, recurrent, unspecified: Secondary | ICD-10-CM | POA: Diagnosis not present

## 2016-12-27 DIAGNOSIS — K5730 Diverticulosis of large intestine without perforation or abscess without bleeding: Secondary | ICD-10-CM | POA: Diagnosis not present

## 2016-12-27 DIAGNOSIS — K5669 Other partial intestinal obstruction: Secondary | ICD-10-CM | POA: Diagnosis not present

## 2016-12-27 DIAGNOSIS — Z515 Encounter for palliative care: Secondary | ICD-10-CM | POA: Diagnosis not present

## 2016-12-27 DIAGNOSIS — K5790 Diverticulosis of intestine, part unspecified, without perforation or abscess without bleeding: Secondary | ICD-10-CM | POA: Diagnosis not present

## 2016-12-27 DIAGNOSIS — Z7982 Long term (current) use of aspirin: Secondary | ICD-10-CM | POA: Diagnosis not present

## 2016-12-27 DIAGNOSIS — R1013 Epigastric pain: Secondary | ICD-10-CM | POA: Diagnosis not present

## 2016-12-27 DIAGNOSIS — K56699 Other intestinal obstruction unspecified as to partial versus complete obstruction: Secondary | ICD-10-CM | POA: Diagnosis not present

## 2016-12-27 DIAGNOSIS — R419 Unspecified symptoms and signs involving cognitive functions and awareness: Secondary | ICD-10-CM | POA: Diagnosis not present

## 2016-12-27 DIAGNOSIS — E43 Unspecified severe protein-calorie malnutrition: Secondary | ICD-10-CM | POA: Diagnosis not present

## 2016-12-27 DIAGNOSIS — K219 Gastro-esophageal reflux disease without esophagitis: Secondary | ICD-10-CM | POA: Diagnosis not present

## 2016-12-27 DIAGNOSIS — Z6820 Body mass index (BMI) 20.0-20.9, adult: Secondary | ICD-10-CM | POA: Diagnosis not present

## 2016-12-27 DIAGNOSIS — I2510 Atherosclerotic heart disease of native coronary artery without angina pectoris: Secondary | ICD-10-CM | POA: Diagnosis not present

## 2016-12-27 DIAGNOSIS — Z79891 Long term (current) use of opiate analgesic: Secondary | ICD-10-CM | POA: Diagnosis not present

## 2016-12-27 DIAGNOSIS — I10 Essential (primary) hypertension: Secondary | ICD-10-CM | POA: Diagnosis not present

## 2016-12-27 DIAGNOSIS — Z96612 Presence of left artificial shoulder joint: Secondary | ICD-10-CM | POA: Diagnosis not present

## 2016-12-27 DIAGNOSIS — F329 Major depressive disorder, single episode, unspecified: Secondary | ICD-10-CM | POA: Diagnosis not present

## 2016-12-27 DIAGNOSIS — K56609 Unspecified intestinal obstruction, unspecified as to partial versus complete obstruction: Secondary | ICD-10-CM | POA: Diagnosis not present

## 2016-12-27 DIAGNOSIS — E871 Hypo-osmolality and hyponatremia: Secondary | ICD-10-CM | POA: Diagnosis not present

## 2016-12-27 DIAGNOSIS — M797 Fibromyalgia: Secondary | ICD-10-CM | POA: Diagnosis not present

## 2016-12-27 DIAGNOSIS — Z981 Arthrodesis status: Secondary | ICD-10-CM | POA: Diagnosis not present

## 2016-12-27 DIAGNOSIS — R109 Unspecified abdominal pain: Secondary | ICD-10-CM | POA: Diagnosis not present

## 2016-12-27 DIAGNOSIS — Z4781 Encounter for orthopedic aftercare following surgical amputation: Secondary | ICD-10-CM | POA: Diagnosis not present

## 2016-12-27 DIAGNOSIS — K55059 Acute (reversible) ischemia of intestine, part and extent unspecified: Secondary | ICD-10-CM | POA: Diagnosis not present

## 2016-12-27 DIAGNOSIS — Z9049 Acquired absence of other specified parts of digestive tract: Secondary | ICD-10-CM | POA: Diagnosis not present

## 2016-12-27 DIAGNOSIS — M6281 Muscle weakness (generalized): Secondary | ICD-10-CM | POA: Diagnosis not present

## 2016-12-27 DIAGNOSIS — I720 Aneurysm of carotid artery: Secondary | ICD-10-CM | POA: Diagnosis not present

## 2016-12-27 DIAGNOSIS — K529 Noninfective gastroenteritis and colitis, unspecified: Secondary | ICD-10-CM | POA: Diagnosis not present

## 2016-12-27 DIAGNOSIS — R509 Fever, unspecified: Secondary | ICD-10-CM | POA: Diagnosis not present

## 2016-12-27 DIAGNOSIS — R4189 Other symptoms and signs involving cognitive functions and awareness: Secondary | ICD-10-CM | POA: Diagnosis not present

## 2016-12-27 DIAGNOSIS — R1032 Left lower quadrant pain: Secondary | ICD-10-CM | POA: Diagnosis not present

## 2016-12-27 DIAGNOSIS — K633 Ulcer of intestine: Secondary | ICD-10-CM | POA: Diagnosis not present

## 2017-01-10 ENCOUNTER — Other Ambulatory Visit: Payer: Self-pay | Admitting: *Deleted

## 2017-01-10 DIAGNOSIS — F339 Major depressive disorder, recurrent, unspecified: Secondary | ICD-10-CM | POA: Diagnosis not present

## 2017-01-10 DIAGNOSIS — K219 Gastro-esophageal reflux disease without esophagitis: Secondary | ICD-10-CM | POA: Diagnosis not present

## 2017-01-10 DIAGNOSIS — K56609 Unspecified intestinal obstruction, unspecified as to partial versus complete obstruction: Secondary | ICD-10-CM | POA: Diagnosis not present

## 2017-01-10 DIAGNOSIS — J449 Chronic obstructive pulmonary disease, unspecified: Secondary | ICD-10-CM | POA: Diagnosis not present

## 2017-01-10 DIAGNOSIS — R5381 Other malaise: Secondary | ICD-10-CM | POA: Diagnosis not present

## 2017-01-10 DIAGNOSIS — I2510 Atherosclerotic heart disease of native coronary artery without angina pectoris: Secondary | ICD-10-CM | POA: Diagnosis not present

## 2017-01-10 DIAGNOSIS — K589 Irritable bowel syndrome without diarrhea: Secondary | ICD-10-CM | POA: Diagnosis not present

## 2017-01-10 DIAGNOSIS — I10 Essential (primary) hypertension: Secondary | ICD-10-CM | POA: Diagnosis not present

## 2017-01-10 DIAGNOSIS — R296 Repeated falls: Secondary | ICD-10-CM | POA: Diagnosis not present

## 2017-01-10 DIAGNOSIS — I5189 Other ill-defined heart diseases: Secondary | ICD-10-CM | POA: Diagnosis not present

## 2017-01-10 DIAGNOSIS — K551 Chronic vascular disorders of intestine: Secondary | ICD-10-CM | POA: Diagnosis not present

## 2017-01-10 DIAGNOSIS — Z96612 Presence of left artificial shoulder joint: Secondary | ICD-10-CM | POA: Diagnosis not present

## 2017-01-10 DIAGNOSIS — I7389 Other specified peripheral vascular diseases: Secondary | ICD-10-CM | POA: Diagnosis not present

## 2017-01-10 DIAGNOSIS — K56699 Other intestinal obstruction unspecified as to partial versus complete obstruction: Secondary | ICD-10-CM | POA: Diagnosis not present

## 2017-01-10 DIAGNOSIS — R419 Unspecified symptoms and signs involving cognitive functions and awareness: Secondary | ICD-10-CM | POA: Diagnosis not present

## 2017-01-10 DIAGNOSIS — Z720 Tobacco use: Secondary | ICD-10-CM | POA: Diagnosis not present

## 2017-01-10 DIAGNOSIS — G8929 Other chronic pain: Secondary | ICD-10-CM | POA: Diagnosis not present

## 2017-01-10 DIAGNOSIS — R4189 Other symptoms and signs involving cognitive functions and awareness: Secondary | ICD-10-CM | POA: Diagnosis not present

## 2017-01-10 DIAGNOSIS — E038 Other specified hypothyroidism: Secondary | ICD-10-CM | POA: Diagnosis not present

## 2017-01-10 DIAGNOSIS — M6281 Muscle weakness (generalized): Secondary | ICD-10-CM | POA: Diagnosis not present

## 2017-01-10 DIAGNOSIS — K5669 Other partial intestinal obstruction: Secondary | ICD-10-CM | POA: Diagnosis not present

## 2017-01-10 DIAGNOSIS — E039 Hypothyroidism, unspecified: Secondary | ICD-10-CM | POA: Diagnosis not present

## 2017-01-10 NOTE — Patient Outreach (Signed)
[facility]) Care Management   01/10/2017  [name] 05/09/1935 [id number]  Unable to reach [name] by phone today. I left a message requesting a return call.   Plan: I will reach out to [name] again next week.    [place] Management  [phone]

## 2017-01-12 DIAGNOSIS — R5381 Other malaise: Secondary | ICD-10-CM | POA: Diagnosis not present

## 2017-01-12 DIAGNOSIS — K56699 Other intestinal obstruction unspecified as to partial versus complete obstruction: Secondary | ICD-10-CM | POA: Diagnosis not present

## 2017-01-18 ENCOUNTER — Other Ambulatory Visit: Payer: Self-pay | Admitting: *Deleted

## 2017-01-18 NOTE — Patient Outreach (Signed)
[facility]) Care Management  01/18/2017  [name] May 05, 1935 [id number]  I was unable to reach [name] by phone today and left a HIPPA compliant voice message requesting a return call.    [place] Management  [phone]

## 2017-01-22 ENCOUNTER — Encounter: Payer: Self-pay | Admitting: Cardiology

## 2017-01-22 ENCOUNTER — Ambulatory Visit: Payer: PPO | Admitting: Cardiology

## 2017-01-22 NOTE — Progress Notes (Deleted)
Cardiology Office Note  Date: 01/22/2017   ID: [name], DOB Jul 20, 1935, MRN [id number]  PCP: Sinda Du, MD  Evaluating cardiologist: Rozann Lesches, MD   No chief complaint on file.   History of Present Illness: [name] is a 81 y.o. female that I am meeting for the first time today.  She recently saw Dr. Ellyn Hack in September for preoperative cardiac consultation prior to left shoulder surgery.  She underwent left total shoulder arthroplasty on September 26 with Dr. Percell Miller, no obvious perioperative cardiac events.  Echocardiogram from September revealed LVEF 60-65% with mild diastolic dysfunction.  Carotid Dopplers indicated no hemodynamically significant stenosis of the R ICA with 50-69% stenosis of the LICA.  Past Medical History:  Diagnosis Date  . Anxiety   . Arthritis   . Constipation due to pain medication   . COPD (chronic obstructive pulmonary disease) ([place])   . Depression   . Diverticulosis   . Fibromyalgia    Patient does think so,  [facility] dr said I do  . Gastritis   . GERD (gastroesophageal reflux disease)   . Head injury   . Hypertension   . Hypothyroid   . Hypothyroidism   . Osteoarthritis   . Peripheral vascular disease ([place])    Bilateral subclavian artery disease (status post angioplasty). Also status post left CEA. Status post celiac artery PTA.  Marland Kitchen Pneumonia   . PONV (postoperative nausea and vomiting)    severe n/v after every surgery- DId not get sick after surgery 07/2015  . Shortness of breath dyspnea   . Stroke [facility]) 02/2013   Archie Endo 03/12/2013; memory - at times  . Urgency incontinence     Past Surgical History:  Procedure Laterality Date  . ANTERIOR AND POSTERIOR REPAIR  12/2000   Archie Endo 07/04/2010  . ANTERIOR CERVICAL DECOMP/DISCECTOMY FUSION N/A 04/03/2012   Procedure: ANTERIOR CERVICAL DECOMPRESSION/DISCECTOMY FUSION 2 LEVELS;  Surgeon: Hosie Spangle, MD;  Location: [place] NEURO ORS;  Service: Neurosurgery;   Laterality: N/A;  Cervical four-five,Cervical five-six anterior cervical decompression with fusion plating and bonegraft  . BACK SURGERY    . BREAST SURGERY Right 02/2007   Archie Endo 06/23/2010  . BUNIONECTOMY WITH HAMMERTOE RECONSTRUCTION Left 09/10/2007   Archie Endo 06/23/2010  . CAROTID STENT Left   . CATARACT EXTRACTION W/ INTRAOCULAR LENS IMPLANT Left    Archie Endo 06/23/2010  . CHOLECYSTECTOMY    . COLONOSCOPY N/A 07/15/2014   Procedure: COLONOSCOPY;  Surgeon: Rogene Houston, MD;  [facility];  Service: Endoscopy;  Laterality: N/A;  200 - moved to 2:10 - Ann to notify pt  . COMPRESSION HIP SCREW Right 08/01/2015   Procedure: COMPRESSION HIP;  Surgeon: Carole Civil, MD;  Location: [facility] ORS;  Service: Orthopedics;  Laterality: Right;  . ESOPHAGOGASTRODUODENOSCOPY N/A 07/15/2014   Procedure: ESOPHAGOGASTRODUODENOSCOPY (EGD);  Surgeon: Rogene Houston, MD;  Location: [facility];  Service: Endoscopy;  Laterality: N/A;  . EYE SURGERY     cataract /lens both eyes  . FRACTURE SURGERY    . INCONTINENCE SURGERY  12/2000   Tension-free transvaginal tape procedure/notes 07/04/2010  . JOINT REPLACEMENT    . LUMBAR FUSION  08/2006   Archie Endo 06/23/2010  . PERIPHERAL VASCULAR CATHETERIZATION N/A 10/08/2014   Procedure: Visceral Angiography;  Surgeon: Algernon Huxley, MD;  Location: [place] CV LAB;  Service: Cardiovascular;  - long stenosis/occlusion of SMA. 75% celiac artery (PTA with 6 mm balloon)  . PERIPHERAL VASCULAR CATHETERIZATION N/A 10/08/2014   Procedure:  Visceral Artery Intervention;  Surgeon: Algernon Huxley, MD;  Location: [place] CV LAB;  Service: Cardiovascular;  Laterality: N/A;  . ROTATOR CUFF REPAIR Left X 3  . TOTAL KNEE ARTHROPLASTY Bilateral 2005; 2011   right; left  . TOTAL SHOULDER ARTHROPLASTY Left 11/15/2016  . TOTAL SHOULDER ARTHROPLASTY Left 11/15/2016   Procedure: LEFT TOTAL SHOULDER ARTHROPLASTY;  Surgeon: [name], MD;  Location: [place];  Service: Orthopedics;   Laterality: Left;  Marland Kitchen VAGINAL HYSTERECTOMY  12/2000   Archie Endo 07/04/2010  . VASCULAR SURGERY      Current Outpatient Medications  Medication Sig Dispense Refill  . Ascorbic Acid (VITAMIN C) 1000 MG tablet Take 1,000 mg by mouth 2 (two) times daily.     Marland Kitchen aspirin EC 325 MG EC tablet Take 1 tablet (325 mg total) by mouth daily with breakfast. 30 tablet 0  . diltiazem (DILACOR XR) 120 MG 24 hr capsule Take 120 mg by mouth every evening.    . diphenhydrAMINE (SOMINEX) 25 MG tablet Take 50 mg by mouth at bedtime.     . DULoxetine (CYMBALTA) 30 MG capsule Take 30 mg by mouth daily.     . famotidine (PEPCID) 20 MG tablet Take 1 tablet (20 mg total) by mouth 2 (two) times daily. 30 tablet 0  . HYDROmorphone (DILAUDID) 2 MG tablet Take by mouth every 4 (four) hours as needed for severe pain.    Marland Kitchen levothyroxine (SYNTHROID, LEVOTHROID) 88 MCG tablet Take 1 tablet (88 mcg total) by mouth daily. 30 tablet 1  . Multiple Vitamin (MULTIVITAMIN WITH MINERALS) TABS Take 1 tablet by mouth daily.    . ondansetron (ZOFRAN) 4 MG tablet Take 1 tablet (4 mg total) by mouth every 8 (eight) hours as needed for nausea or vomiting. 40 tablet 0  . pantoprazole (PROTONIX) 40 MG tablet Take 1 tablet (40 mg total) by mouth daily. 90 tablet 3  . polyethylene glycol (MIRALAX / GLYCOLAX) packet Take 17 g by mouth 2 (two) times daily. (Patient taking differently: Take 17 g by mouth daily as needed for mild constipation or moderate constipation. ) 14 each 0  . sucralfate (CARAFATE) 1 g tablet Take 1 tablet (1 g total) by mouth 4 (four) times daily -  with meals and at bedtime. 120 tablet 3  . sucralfate (CARAFATE) 1 GM/10ML suspension Take 10 mLs (1 g total) by mouth 4 (four) times daily -  with meals and at bedtime. Please use three times daily for the next five days. (Patient not taking: Reported on 12/20/2016) 420 mL 0   No current facility-administered medications for this visit.    Allergies:  Aleve [naproxen sodium];  Hydrocodone; Statins; Gluten meal; Wheat bran; Celebrex [celecoxib]; Codeine; and Morphine and related   Social History: The patient  reports that she has quit smoking. Her smoking use included cigarettes. She has a 7.56 pack-year smoking history. she has never used smokeless tobacco. She reports that she does not drink alcohol or use drugs.   Family History: The patient's family history includes Pancreatic cancer in her mother; Prostate cancer in her father.   ROS:  Please see the history of present illness. Otherwise, complete review of systems is positive for {NONE DEFAULTED:18576::"none"}.  All other systems are reviewed and negative.   Physical Exam: VS:  There were no vitals taken for this visit., BMI There is no height or weight on file to calculate BMI.  Wt Readings from Last 3 Encounters:  12/13/16 119 lb (54 kg)  12/08/16 118 lb (53.5 kg)  12/02/16 118 lb (53.5 kg)    General: Patient appears comfortable at rest. HEENT: Conjunctiva and lids normal, oropharynx clear with moist mucosa. Neck: Supple, no elevated JVP or carotid bruits, no thyromegaly. Lungs: Clear to auscultation, nonlabored breathing at rest. Cardiac: Regular rate and rhythm, no S3 or significant systolic murmur, no pericardial rub. Abdomen: Soft, nontender, no hepatomegaly, bowel sounds present, no guarding or rebound. Extremities: No pitting edema, distal pulses 2+. Skin: Warm and dry. Musculoskeletal: No kyphosis. Neuropsychiatric: Alert and oriented x3, affect grossly appropriate.  ECG: I personally reviewed the tracing from 11/18/2016 which showed sinus tachycardia with PVCs, possible left atrial enlargement.  Recent Labwork: 11/20/2016: Magnesium 1.7 12/08/2016: ALT 30; AST 35; BUN 12; Creatinine, Ser 0.58; Hemoglobin 12.2; Platelets 513; Potassium 3.3; Sodium 128     Component Value Date/Time   CHOL 169 02/28/2013 0314   TRIG 56 02/28/2013 0314   HDL 57 02/28/2013 0314   VLDL 11 02/28/2013 0314    LDLCALC 101 (H) 02/28/2013 0314    Other Studies Reviewed Today:  Echocardiogram 11/10/2016: Study Conclusions  - Left ventricle: The cavity size was normal. Wall thickness was   normal. Systolic function was normal. The estimated ejection   fraction was in the range of 60% to 65%. Wall motion was normal;   there were no regional wall motion abnormalities. Doppler   parameters are consistent with abnormal left ventricular   relaxation (grade 1 diastolic dysfunction). - Aortic valve: Trileaflet; mildly calcified leaflets. Left   coronary cusp mobility was restricted. - Mitral valve: Mildly calcified annulus. Mildly calcified leaflets   . - Right atrium: Central venous pressure (est): 3 mm Hg. - Atrial septum: No defect or patent foramen ovale was identified. - Tricuspid valve: There was trivial regurgitation. - Pulmonary arteries: Systolic pressure could not be accurately   estimated. - Pericardium, extracardiac: There was no pericardial effusion.  Impressions:  - Normal LV wall thickness with LVEF 60-65% and grade 1 diastolic   dysfunction. Mildly calcified mitral annulus and leaflets.   Sclerotic aortic valve. Trivial tricuspid regurgitation.  Carotid Dopplers 11/13/2016: IMPRESSION: Right:  Color duplex indicates moderate heterogeneous and calcified plaque, with no hemodynamically significant stenosis by duplex criteria in the extracranial cerebrovascular circulation.  Left:  Color duplex indicates moderate heterogeneous and calcified plaque, with 50%- 69% stenosis by established duplex criteria. Note that the flow velocities of the left ICA were obtained from an area distal to the maximum narrowing due to the presence of anterior wall plaque with shadowing and may be underestimating the percentage of ICA stenosis. If there is concern for more precise evaluation of degree of stenosis, consider either formal cerebral angiogram, or alternatively, CT  angiogram.  Assessment and Plan:   Current medicines were reviewed with the patient today.  No orders of the defined types were placed in this encounter.   Disposition:  Signed, Satira Sark, MD, [facility] 01/22/2017 9:08 AM    [place] [facility] [place]. [address] [place], [place] [address] Phone: [phone]; Fax: [phone]

## 2017-01-25 DIAGNOSIS — K56699 Other intestinal obstruction unspecified as to partial versus complete obstruction: Secondary | ICD-10-CM | POA: Diagnosis not present

## 2017-01-25 DIAGNOSIS — J449 Chronic obstructive pulmonary disease, unspecified: Secondary | ICD-10-CM | POA: Diagnosis not present

## 2017-01-25 DIAGNOSIS — R5381 Other malaise: Secondary | ICD-10-CM | POA: Diagnosis not present

## 2017-01-25 DIAGNOSIS — I5189 Other ill-defined heart diseases: Secondary | ICD-10-CM | POA: Diagnosis not present

## 2017-01-25 DIAGNOSIS — K219 Gastro-esophageal reflux disease without esophagitis: Secondary | ICD-10-CM | POA: Diagnosis not present

## 2017-01-25 DIAGNOSIS — I10 Essential (primary) hypertension: Secondary | ICD-10-CM | POA: Diagnosis not present

## 2017-01-25 DIAGNOSIS — I2510 Atherosclerotic heart disease of native coronary artery without angina pectoris: Secondary | ICD-10-CM | POA: Diagnosis not present

## 2017-01-25 DIAGNOSIS — K589 Irritable bowel syndrome without diarrhea: Secondary | ICD-10-CM | POA: Diagnosis not present

## 2017-01-25 DIAGNOSIS — E039 Hypothyroidism, unspecified: Secondary | ICD-10-CM | POA: Diagnosis not present

## 2017-01-25 DIAGNOSIS — G8929 Other chronic pain: Secondary | ICD-10-CM | POA: Diagnosis not present

## 2017-01-26 ENCOUNTER — Other Ambulatory Visit: Payer: Self-pay | Admitting: *Deleted

## 2017-01-26 ENCOUNTER — Ambulatory Visit: Payer: PPO | Admitting: Urology

## 2017-01-26 NOTE — Patient Outreach (Signed)
[facility]) Care Management  01/26/2017  [name] [birth year] [id number]  [facility] originally received a referral for transition of care services for [name] on 10/9/18from our telephonic case management team on behalf of Dr. Sinda Du. I have been unable to reach [name] by phone over the last > 1 week period. I learned today that [name] has been at [facility] (skilled nursing facility) since 01/10/17. I reached out to [facility] [phone]) and was connected to the voice mail of Asia Henrene Pastor (I was also given the name of Lurlean Horns by the reception) with whom I left a message identifying myself and requesting a return call so that we can collaborate regarding care management and care coordination needs for [name].   Plan: If I have not heard back from the team at [facility] by Monday, I will return a call to them and will follow up with the [place] Management team.    [place] Management  [phone]

## 2017-01-31 ENCOUNTER — Other Ambulatory Visit: Payer: Self-pay | Admitting: *Deleted

## 2017-01-31 DIAGNOSIS — I110 Hypertensive heart disease with heart failure: Secondary | ICD-10-CM | POA: Diagnosis not present

## 2017-01-31 DIAGNOSIS — G8929 Other chronic pain: Secondary | ICD-10-CM | POA: Diagnosis not present

## 2017-01-31 DIAGNOSIS — I5030 Unspecified diastolic (congestive) heart failure: Secondary | ICD-10-CM | POA: Diagnosis not present

## 2017-01-31 DIAGNOSIS — F17210 Nicotine dependence, cigarettes, uncomplicated: Secondary | ICD-10-CM | POA: Diagnosis not present

## 2017-01-31 DIAGNOSIS — J449 Chronic obstructive pulmonary disease, unspecified: Secondary | ICD-10-CM | POA: Diagnosis not present

## 2017-01-31 DIAGNOSIS — I771 Stricture of artery: Secondary | ICD-10-CM | POA: Diagnosis not present

## 2017-01-31 DIAGNOSIS — E039 Hypothyroidism, unspecified: Secondary | ICD-10-CM | POA: Diagnosis not present

## 2017-01-31 NOTE — Patient Outreach (Signed)
[facility]) Care Management  01/31/2017  [name] Mar 06, 1935 [id number]  [name] is a 81 year old female with a past medical history which includes CHF, HTN, and CVA. She is reported to live alone and history of falls. She has a history of home care services with [facility]. [name] was initially discharged from [facility] on 11/25/16 then referred to [facility] by her primary care provider on 11/28/16. After multiple outreach attempts I was unable to reach [name] prior to her readmission to the hospital on 12/02/16 for treatment of colitis.   I reached out to [name] today and she told me she was extremely grateful to finally be at home after all her "travels". She says she feels well today. She does not have a follow up appointment scheduled with Dr. Sinda Du for primary care. I reached out to the office of Dr. Luan Pulling to inquire about a follow up appointment and was able to get her on the schedule for Wednesday 02/07/17 @ 11:30am. [name] does have an appointment scheduled with her orthopedic surgeon (shoulder), Dr. Percell Miller on Tuesday, 02/06/17. Her son will take her to the appointment. [name] says she has all prescribed medications and is taking them as instructed.   [name] agrees to ongoing [place] [facility] services and has agreed for me to see her at home next week on Monday 02/05/17 @ 11am for an initial home visit.   [place] Management  [phone]

## 2017-02-01 ENCOUNTER — Other Ambulatory Visit: Payer: Self-pay | Admitting: *Deleted

## 2017-02-01 NOTE — Patient Outreach (Signed)
[facility]) Care Management  02/01/2017  BRITTINI BRUBECK Feb 21, 1935 [id number]   Phone call to the discharge planner at [facility]. Spoke with Rodena Piety who confirmed that patient discharged back home to [facility] on 01/27/17 with [place]. This Education officer, museum will inform LCSW Theadore Nan of patient's discharge due to coverage area.    Sheralyn Boatman [facility] Care Management [phone]

## 2017-02-02 ENCOUNTER — Encounter: Payer: Self-pay | Admitting: Licensed Clinical Social Worker

## 2017-02-02 ENCOUNTER — Other Ambulatory Visit: Payer: Self-pay | Admitting: Licensed Clinical Social Worker

## 2017-02-02 NOTE — Patient Outreach (Signed)
[facility]) Care Management  [facility] Social Work  02/02/2017  [name] 1935/08/23 [id number]  Subjective:    Objective:   Encounter Medications:  Outpatient Encounter Medications as of 02/02/2017  Medication Sig Note  . Ascorbic Acid (VITAMIN C) 1000 MG tablet Take 1,000 mg by mouth 2 (two) times daily.    Marland Kitchen aspirin EC 325 MG EC tablet Take 1 tablet (325 mg total) by mouth daily with breakfast.   . diltiazem (DILACOR XR) 120 MG 24 hr capsule Take 120 mg by mouth every evening.   . diphenhydrAMINE (SOMINEX) 25 MG tablet Take 50 mg by mouth at bedtime.    . DULoxetine (CYMBALTA) 30 MG capsule Take 30 mg by mouth daily.    . famotidine (PEPCID) 20 MG tablet Take 1 tablet (20 mg total) by mouth 2 (two) times daily.   Marland Kitchen HYDROmorphone (DILAUDID) 2 MG tablet Take by mouth every 4 (four) hours as needed for severe pain.   Marland Kitchen levothyroxine (SYNTHROID, LEVOTHROID) 88 MCG tablet Take 1 tablet (88 mcg total) by mouth daily.   . Multiple Vitamin (MULTIVITAMIN WITH MINERALS) TABS Take 1 tablet by mouth daily.   . ondansetron (ZOFRAN) 4 MG tablet Take 1 tablet (4 mg total) by mouth every 8 (eight) hours as needed for nausea or vomiting.   . pantoprazole (PROTONIX) 40 MG tablet Take 1 tablet (40 mg total) by mouth daily.   . polyethylene glycol (MIRALAX / GLYCOLAX) packet Take 17 g by mouth 2 (two) times daily. (Patient taking differently: Take 17 g by mouth daily as needed for mild constipation or moderate constipation. )   . sucralfate (CARAFATE) 1 g tablet Take 1 tablet (1 g total) by mouth 4 (four) times daily -  with meals and at bedtime. 12/20/2016: Taking differently; taking only once daily in the morning  . sucralfate (CARAFATE) 1 GM/10ML suspension Take 10 mLs (1 g total) by mouth 4 (four) times daily -  with meals and at bedtime. Please use three times daily for the next five days. (Patient not taking: Reported on 12/20/2016)    No facility-administered encounter  medications on file as of 02/02/2017.     Functional Status:  In your present state of health, do you have any difficulty performing the following activities: 02/02/2017 12/02/2016  Hearing? N N  Vision? N N  Difficulty concentrating or making decisions? N N  Comment - -  Walking or climbing stairs? Y N  Comment - -  Dressing or bathing? Y N  Doing errands, shopping? Y N  Preparing Food and eating ? Y -  Using the Toilet? N -  In the past six months, have you accidently leaked urine? N -  Do you have problems with loss of bowel control? N -  Managing your Medications? N -  Managing your Finances? N -  Housekeeping or managing your Housekeeping? Y -  Some recent data might be hidden    Fall/Depression Screening:  PHQ 2/9 Scores 02/02/2017 11/28/2016 08/07/2016  PHQ - 2 Score 2 0 1  PHQ- 9 Score 5 - -    Assessment:   CSW received referral on [name]. CSW completed chart review on client on 02/02/17.  CSW traveled to home of client on 02/02/17.  CSW met with client on 02/02/17 at home of client for initial home visit. CSW verified identity of client. CSW received verbal permission from client on 02/02/17 for CSW to speak with client about client needs and status.  Client sees Dr. Velvet Bathe as primary care doctor. Client said she had her prescribed medications and is taking medications as prescribed. Client said she has some pain issues in her left shoulder.  She said she has talked with Dr. Luan Pulling about her pain issues. She has support from her sons. One son lives in close proximity to client. She said she has needed in home equipment.  She spoke of decreased appetite. She said she has appointment with Dr. Luan Pulling next Wednesday.  CSW and client completed needed [facility] assessments for client. CSW and client spoke of client care plan. CSW encouraged client to communicate with CSW in next 30 days to discuss community resources of assistance for client.  Client said her son currently  transports client to and from client's medical appointments. She said she is sleeping adequately.  She said she can ambulate well in her home and has easy access in and out of her home. CSW gave client [facility] CSW card and encouraged client to call CSW at [phone] as needed to discuss social work needs of client. CSW thanked client for allowing CSW to visit with her at her home on 02/02/17.  Client was appreciative of CSW visit with client on 02/02/17.  Plan:   Client to communicate with CSW in next 30 days to discuss community resources of assistance for client  CSW to call client in 3 weeks to assess client needs at that time.  Norva Riffle.Marvion Bastidas MSW, LCSW Licensed Clinical Social Worker [facility] Care Management [phone]

## 2017-02-05 ENCOUNTER — Other Ambulatory Visit: Payer: Self-pay | Admitting: *Deleted

## 2017-02-05 NOTE — Patient Outreach (Signed)
[facility] [facility]) Care Management   02/05/2017  ANVI MANGAL [birth year] [id number]   ALPHA CHOUINARD is an 81 y.o. female with a past medical history which includes CHF, HTN, and CVA. She is reported to live alone and history of falls. She has a history of home care services with [facility] but was changed to . Mrs. Stringfield was initially discharged from [facility] on 11/25/16 then referred to [facility] by her primary care provider on 11/28/16. After multiple outreach attempts I was unable to reach Mrs. Kulig prior to her readmission to the hospital on 12/02/16 for treatment of colitis. I am seeing Mrs. Kwan at home today for our initial home visit.    Subjective: "I'm still having diarrhea. It starts early in the morning. Its watery and it happens about 3 times every day."  Objective:  BP 130/64   Pulse 78   Wt 110 lb (49.9 kg)   SpO2 94%   BMI 20.45 kg/m   Review of Systems  Constitutional: Positive for malaise/fatigue. Negative for chills, fever and weight loss.  HENT: Negative.   Eyes: Negative.   Respiratory: Negative.  Negative for sputum production, shortness of breath and wheezing.   Cardiovascular: Negative.   Gastrointestinal: Positive for diarrhea. Negative for blood in stool, constipation, melena and vomiting.  Genitourinary: Negative.   Musculoskeletal: Negative.  Negative for falls.  Skin: Negative.   Neurological: Negative.  Negative for dizziness, focal weakness and loss of consciousness.  Psychiatric/Behavioral: Negative.     Physical Exam  Constitutional: She is oriented to person, place, and time. Vital signs are normal. She appears well-developed. She is active. She has a sickly appearance. She does not appear ill.  Cardiovascular: Normal rate and regular rhythm.  Respiratory: Effort normal and breath sounds normal. She has no wheezes. She has no rhonchi. She has no rales.  GI: Soft. Bowel sounds are normal. She exhibits no  distension. There is no tenderness.  Neurological: She is alert and oriented to person, place, and time.  Skin: Skin is warm, dry and intact.  Psychiatric: She has a normal mood and affect. Her speech is normal and behavior is normal. Judgment and thought content normal. Cognition and memory are normal.    Encounter Medications:   Outpatient Encounter Medications as of 02/05/2017  Medication Sig Note  . DULoxetine (CYMBALTA) 30 MG capsule Take 30 mg by mouth daily.    Marland Kitchen levothyroxine (SYNTHROID, LEVOTHROID) 88 MCG tablet Take 1 tablet (88 mcg total) by mouth daily.   . Multiple Vitamin (MULTIVITAMIN WITH MINERALS) TABS Take 1 tablet by mouth daily.   . ondansetron (ZOFRAN) 4 MG tablet Take 1 tablet (4 mg total) by mouth every 8 (eight) hours as needed for nausea or vomiting.   . Ascorbic Acid (VITAMIN C) 1000 MG tablet Take 1,000 mg by mouth 2 (two) times daily.    Marland Kitchen aspirin EC 325 MG EC tablet Take 1 tablet (325 mg total) by mouth daily with breakfast. (Patient not taking: Reported on 02/05/2017)   . diltiazem (DILACOR XR) 120 MG 24 hr capsule Take 120 mg by mouth every evening.   . diphenhydrAMINE (SOMINEX) 25 MG tablet Take 50 mg by mouth at bedtime.    . famotidine (PEPCID) 20 MG tablet Take 1 tablet (20 mg total) by mouth 2 (two) times daily. (Patient not taking: Reported on 02/05/2017) NOT TAKING  . HYDROmorphone (DILAUDID) 2 MG tablet Take by mouth every 4 (four) hours as needed  for severe pain. NOT TAKING  . pantoprazole (PROTONIX) 40 MG tablet Take 1 tablet (40 mg total) by mouth daily. (Patient not taking: Reported on 02/05/2017) NOT TAKING  . polyethylene glycol (MIRALAX / GLYCOLAX) packet Take 17 g by mouth 2 (two) times daily. (Patient not taking: Reported on 02/05/2017) NOT TAKING  . sucralfate (CARAFATE) 1 g tablet Take 1 tablet (1 g total) by mouth 4 (four) times daily -  with meals and at bedtime. (Patient not taking: Reported on 02/05/2017) NOT TAKING  . sucralfate (CARAFATE)  1 GM/10ML suspension Take 10 mLs (1 g total) by mouth 4 (four) times daily -  with meals and at bedtime. Please use three times daily for the next five days. (Patient not taking: Reported on 12/20/2016) NOT TAKING   No facility-administered encounter medications on file as of 02/05/2017.     Functional Status:   In your present state of health, do you have any difficulty performing the following activities: 02/02/2017 12/02/2016  Hearing? N N  Vision? N N  Difficulty concentrating or making decisions? N N  Comment - -  Walking or climbing stairs? Y N  Comment - -  Dressing or bathing? Y N  Doing errands, shopping? Y N  Preparing Food and eating ? Y -  Using the Toilet? N -  In the past six months, have you accidently leaked urine? N -  Do you have problems with loss of bowel control? N -  Managing your Medications? N -  Managing your Finances? N -  Housekeeping or managing your Housekeeping? Y -  Some recent data might be hidden    Fall/Depression Screening:    Fall Risk  02/02/2017 11/28/2016  Falls in the past year? Yes Yes  Number falls in past yr: 2 or more 2 or more  Injury with Fall? No Yes  Risk Factor Category  High Fall Risk High Fall Risk  Risk for fall due to : History of fall(s);Impaired balance/gait History of fall(s);Impaired mobility  Follow up Falls prevention discussed Falls evaluation completed   PHQ 2/9 Scores 02/02/2017 11/28/2016 08/07/2016  PHQ - 2 Score 2 0 1  PHQ- 9 Score 5 - -    Assessment:  81 year old female living in [place], [place] after recent discharge from SNF post hospitalization for colitis.   GI Disturbances - Mrs. Burkholder says today that she continues to experience stomach "uneasiness" and has ongoing diarrhea which usually starts very early in the morning and happens approximately 3 times daily. She describes watery stools. She says today that she has been taking Zofran for diarrhea. I explained to her that Zofran is for nausea. Based on my  conversation and assessment today, she is not taking any prn medications for diarrhea.   My best estimate after reviewing Mrs. Shannon's dietary intake with her is that she is getting between [phone] calories/day. She is eating small frequent meals which sometimes include Boost nutrition supplement if she does not feel like eating "real food". She says that "some days I have to force feed".  She makes protein shakes for herself from milk and protein powder and tries to choose higher calorie non dairy snacks between meals. I offered to help make arrangements for her to receive meals on wheels but she declined stating her son helps her get to the grocery store and that she is able to prepare her own meals.   Mrs. Nobile's current weight is 110# which she says is 50# lighter than when she  retired.   Medication Management - please see details in med list outlined above; in addition to medications on the prescribed medication list, Mrs. Totten reports that she is taking a "MegaRed" supplement, OTC NIacin, and Pepto Bismol prn.    Mrs  Melby continues to smoke [employer] cigarettes.   Home Care Services - Mrs. Mccabe has a long history of home care with [facility]. When she was recently discharged from a skilled nursing facility in [place], her home care arrangements were made with [facility]. Mrs. Elbaum wished to have resumed home care with [facility]. I notified [name] at the office of Dr. Sinda Du and requested orders to be sent to [facility] to discontinue services and orders be sent to [facility] for [facility], [place], [place].   Upcoming Provider Appointments:  Dr. Sinda Du (primary care)- Wednesday 02/07/17 @ 11:30am Dr. Percell Miller (orthopedic surgery) - Friday, 02/09/17 Her son will provide transportation to appointments.   Plan:   Mrs. Fray will work with her home health team for ongoing visits for physical and occupational therapy. I have requested orders per Mrs.  Morello's request to change back to [facility] (from [facility]) for [facility], [place], and [place] services.   Mrs. Lio will attend all upcoming provider appointments.   Mrs. Eide will call her primary care provider for any new or worsened symptoms.   I will reach out to Mrs. Blakesley again by phone next week.   THN CM Care Plan Problem One     Most Recent Value  Care Plan Problem One  Knowledge Deficits related to acute health condition (colitis)  Role Documenting the Problem One  Care Management [place] for Problem One  Active  THN Long Term Goal   Over the next 60 days, patient will verbalize understanding of plan of care for long term management of colitis  THN Long Term Goal Start Date  02/05/17  Interventions for Problem One Long Term Goal  reviewed current plan of care,  medicaitons reviewed,  collaboration with PCP  THN CM Short Term Goal #1   Over the next 10 days, patient will attend all scheduled provider appointments  THN CM Short Term Goal #1 Start Date  02/05/17  Interventions for Short Term Goal #1  reviewed upcoming provider appointment schedule and ensured that patient has transportation  [facility] CM Short Term Goal #2   Over the next 30 days, patient will verbalize understaning of signs and symptms of worsening condition and when to call provider or [facility]  THN CM Short Term Goal #2 Start Date  02/05/17  Interventions for Short Term Goal #2  REviewed signs and symptoms of worsening condition with patient and discussed when to call provider to report,  prescribed Emmi educaitonal materials  THN CM Short Term Goal #3  Over the next 30 days, patient will adhere to bland, cardiac prudent diet  THN CM Short Term Goal #3 Start Date  02/05/17  Interventions for Short Tern Goal #3  food diary reviewed,  nutrition recommendations reviewed,  collaboration with PCP office re: nutrition assessment and needs    [facility] CM Care Plan Problem Two     Most Recent Value  Care Plan  Problem Two  Medication Management Concerns  Role Documenting the Problem Two  Care Management Coordinator  Care Plan for Problem Two  Active  Interventions for Problem Two Long Term Goal   medication reviewe perofrmed in person,  collaboration with PCP office re: mediatoin  needs and knowledge deficits  THN Long Term Goal  Over the next 31 days, patient will verbalie accurate understanding of prescribed medication regiimen  THN Long Term Goal Start Date  02/05/17  [facility] CM Short Term Goal #1   Over the next 3 days, patient will attend provider appointment and will discuss medication regimen at that time  [facility] CM Short Term Goal #1 Start Date  02/05/17  Interventions for Short Term Goal #2   confirmed upcoming provider appointment and discussed need to review medications with provider      Janalyn Shy [place] [facility]  [phone]

## 2017-02-07 DIAGNOSIS — J449 Chronic obstructive pulmonary disease, unspecified: Secondary | ICD-10-CM | POA: Diagnosis not present

## 2017-02-07 DIAGNOSIS — E039 Hypothyroidism, unspecified: Secondary | ICD-10-CM | POA: Diagnosis not present

## 2017-02-07 DIAGNOSIS — R109 Unspecified abdominal pain: Secondary | ICD-10-CM | POA: Diagnosis not present

## 2017-02-07 DIAGNOSIS — I10 Essential (primary) hypertension: Secondary | ICD-10-CM | POA: Diagnosis not present

## 2017-02-09 DIAGNOSIS — M19012 Primary osteoarthritis, left shoulder: Secondary | ICD-10-CM | POA: Diagnosis not present

## 2017-02-15 ENCOUNTER — Other Ambulatory Visit: Payer: Self-pay | Admitting: *Deleted

## 2017-02-15 NOTE — Patient Outreach (Signed)
[facility]) Care Management  02/15/2017  [name] Aug 15, 1935 [id number]  [name] is an 81 y.o. female with a past medical historywhich includes CHF, HTN, and CVA. She is reported to live alone and history of falls. [facility] . [name] was initially discharged from [facility] on 11/25/16 then referred to [facility] by her primary care provider on 11/28/16. After multiple outreach attempts I was unable to reach [name] prior to her readmission to the hospital on 12/02/16 for treatment of colitis. I reached out to [name] today to follow up on her needs as outlined below.    Assessment:  81 year old female living in [place], [place] after recent discharge from SNF post hospitalization for colitis.   GI Disturbances - [name] says today that she enjoyed the holidays with her family and ate some food she really liked but that it "came back to haunt her". Today, she reports that her GI disturbances have been mild so far and she has been able to eat breakfast without any following distress.    Home Care Services - [name] has a long history of home care with [facility]. When she was recently discharged from a skilled nursing facility in [place], her home care arrangements were made with [facility]. [name] wished to have resumed home care with [facility]. Last week,  I notified [name] at the office of Dr. Sinda Du and requested orders to be sent to [facility] to discontinue services and orders be sent to [facility] for [facility], [place], [place]. Today, [name] says she is still receiving home care services from [facility] and has not heard from [name]. I reached out to the [place] office to see if orders were received and the representative indicated that orders WERE NOT received after the initial orders sent in October. The  representative also indicated that [facility] discontinued their services on Nov 7 when [name] was admitted to the skilled facility for rehab care.   I will notify the office of Dr.Hawkins again today requesting assistance with orders to be sent to [facility] to discontinue services and to [facility] to resume services.   Plan:   [name] will work with her home health team for ongoing visits for physical and occupational therapy.   [name] will call her primary care provider for any new or worsened symptoms.   I will reach out to [name] again by phone next week.   THN CM Care Plan Problem One     Most Recent Value  Care Plan Problem One  Knowledge Deficits related to acute health condition (colitis)  Role Documenting the Problem One  Care Management [place] for Problem One  Active  THN Long Term Goal   Over the next 60 days, patient will verbalize understanding of plan of care for long term management of colitis  THN Long Term Goal Start Date  02/05/17  Interventions for Problem One Long Term Goal  discussed patient's current status, plan of care and reviewed medications  THN CM Short Term Goal #1   Over the next 10 days, patient will attend all scheduled provider appointments  [facility] CM Short Term Goal #1 Start Date  02/05/17  [facility] CM Short Term Goal #1 Met Date  02/15/17  Interventions for Short Term Goal #1  discussed  provider appointments completed over last 10 days  THN CM Short Term Goal #2   Over the next 30 days, patient will verbalize understaning of signs and symptms of worsening condition and when to call provider or [facility]  THN CM Short Term Goal #2 Start Date  02/05/17  Interventions for Short Term Goal #2  discussed current signs and symptoms and discussed signs and symptoms for which patient should contact provider  THN CM Short Term Goal #3  Over the next 30 days, patient will adhere to bland, cardiac prudent diet  THN CM Short Term Goal #3  Start Date  02/05/17  Interventions for Short Tern Goal #3  reviewed recent dietary intake and side effects and current recommended diet    THN CM Care Plan Problem Two     Most Recent Value  Care Plan Problem Two  Medication Management Concerns  Role Documenting the Problem Two  Care Management [place] for Problem Two  Active  Interventions for Problem Two Long Term Goal   reviewed medications discussing no changes from 2 provider appointments  THN Long Term Goal  Over the next 31 days, patient will verbalie accurate understanding of prescribed medication regiimen  THN Long Term Goal Start Date  02/05/17  [facility] CM Short Term Goal #1   Over the next 3 days, patient will attend provider appointment and will discuss medication regimen at that time  [facility] CM Short Term Goal #1 Start Date  02/05/17  [facility] CM Short Term Goal #1 Met Date   02/15/17  Interventions for Short Term Goal #2   discussed       [facility] Management  [phone]

## 2017-02-19 ENCOUNTER — Ambulatory Visit: Payer: Self-pay | Admitting: *Deleted

## 2017-02-21 ENCOUNTER — Other Ambulatory Visit: Payer: Self-pay | Admitting: *Deleted

## 2017-02-21 NOTE — Patient Outreach (Signed)
[facility]) Care Management  02/21/2017  [name] 09-04-1935 [id number]  Unable to reach [name] by phone today.   Plan: I will follow up with [name] next week. She has my contact information should she need to call me before then.    [place] [facility]  [phone]

## 2017-02-27 ENCOUNTER — Ambulatory Visit (INDEPENDENT_AMBULATORY_CARE_PROVIDER_SITE_OTHER): Payer: PPO | Admitting: Internal Medicine

## 2017-02-28 ENCOUNTER — Other Ambulatory Visit: Payer: Self-pay | Admitting: *Deleted

## 2017-02-28 NOTE — Patient Outreach (Signed)
[facility]) Care Management  02/28/2017  [name] June 16, 1935 [id number]  [name] is a 82 year old female living in [place], [place]. She was referred to [facility] after recent discharge from SNF post hospitalization for colitis.I am following up with [name] today for continued transition of care services.   GI Disturbances - [name] says she is having a hard day today with GI upset. She was scheduled to see the gastroenterologist and so did not eat much in the 24hours prior to her appointment. Her ride fell through and she had to reschedule. She feels that her GI disturbance today (diarrhea) is related to her being off her routine eating pattern. She says it is not worse than usual.   Plan:   [name] is to reschedule her GI appointment in the next 24 hours.   [name] will call her primary care provider for any new or worsened symptoms.  I will reach out to [name] again by phone next week.  THN CM Care Plan Problem One     Most Recent Value  Care Plan Problem One  Knowledge Deficits related to acute health condition (colitis)  Role Documenting the Problem One  Care Management [place] for Problem One  Active  THN Long Term Goal   Over the next 60 days, patient will verbalize understanding of plan of care for long term management of colitis  THN Long Term Goal Start Date  02/05/17  Interventions for Problem One Long Term Goal  reviewed long term plan including provider follow up  [facility] CM Short Term Goal #1   Over the next 10 days, patient will attend all scheduled provider appointments  THN CM Short Term Goal #1 Start Date  02/05/17  [facility] CM Short Term Goal #1 Met Date  02/15/17  THN CM Short Term Goal #2   Over the next 30 days, patient will verbalize understaning of signs and symptms of worsening condition and when to call provider or [facility]  THN CM Short Term Goal #2 Start Date  02/05/17   Interventions for Short Term Goal #2  reviewed plan for treatment of GI disturbances and when to call provider  THN CM Short Term Goal #3  Over the next 30 days, patient will adhere to bland, cardiac prudent diet  THN CM Short Term Goal #3 Start Date  02/05/17  Interventions for Short Tern Goal #3  discussed prescribed diet with patient and reviewed oral intake    THN CM Care Plan Problem Two     Most Recent Value  Care Plan Problem Two  Medication Management Concerns  Role Documenting the Problem Two  Care Management [place] for Problem Two  Active  Interventions for Problem Two Long Term Goal   medications discussed with patient  THN Long Term Goal  Over the next 31 days, patient will verbalie accurate understanding of prescribed medication regiimen  THN Long Term Goal Start Date  02/05/17  [facility] CM Short Term Goal #1   Over the next 3 days, patient will attend provider appointment and will discuss medication regimen at that time  [facility] CM Short Term Goal #1 Start Date  02/05/17  [facility] CM Short Term Goal #1 Met Date   02/15/17      [facility] Management  [phone]

## 2017-03-08 ENCOUNTER — Other Ambulatory Visit: Payer: Self-pay | Admitting: *Deleted

## 2017-03-08 NOTE — Patient Outreach (Signed)
[name]) Care Management  03/08/2017  [name] 1935/11/13 [id number]  I called Mrs. Self today to follow up on her status and progress with colitis and GI symptoms. She says she feels she is "back in the normal pattern".   I talked with her about need for ongoing case management services and she feels she does not currently need additional assistance. I offered to transition her to a telephonic case management program so someone could reach out to her regularly but she declined and stated she would call me if anything changed. I assured [name] that we would be happy to help her if she should have case management needs in the future and made sure she had my correct contact information.   Plan: Case Closure.    [facility]  [phone]

## 2017-03-09 NOTE — Progress Notes (Signed)
This encounter was created in error - please disregard.

## 2017-03-15 ENCOUNTER — Ambulatory Visit (INDEPENDENT_AMBULATORY_CARE_PROVIDER_SITE_OTHER): Payer: PPO | Admitting: Internal Medicine

## 2017-04-17 ENCOUNTER — Ambulatory Visit: Payer: PPO | Admitting: Cardiology

## 2017-04-17 ENCOUNTER — Encounter: Payer: Self-pay | Admitting: Cardiology

## 2017-04-17 VITALS — BP 196/68 | HR 72 | Ht 61.5 in | Wt 109.8 lb

## 2017-04-17 DIAGNOSIS — I6523 Occlusion and stenosis of bilateral carotid arteries: Secondary | ICD-10-CM

## 2017-04-17 DIAGNOSIS — I771 Stricture of artery: Secondary | ICD-10-CM

## 2017-04-17 DIAGNOSIS — R0609 Other forms of dyspnea: Secondary | ICD-10-CM | POA: Diagnosis not present

## 2017-04-17 DIAGNOSIS — I10 Essential (primary) hypertension: Secondary | ICD-10-CM | POA: Diagnosis not present

## 2017-04-17 MED ORDER — DILTIAZEM HCL ER COATED BEADS 240 MG PO CP24: 240.0000 mg | ORAL_CAPSULE | Freq: Every day | ORAL | 6 refills | Status: DC

## 2017-04-17 NOTE — Progress Notes (Signed)
PCP: Sinda Du, MD  Clinic Note: Chief Complaint  Patient presents with  . Follow-up    Pt has no complaints   . PAD     carotid, subclavian and splanchnic artery disease.  Marland Kitchen Shortness of Breath    HPI:  [name] is a 82 y.o. female who is being seen today for post-operative Cardiology Evaluation evaluation -- initially seen in Sept 2019 for Pre-Op Eval at the request of Sinda Du, MD and Dr. Kathryne Hitch from orthopedics.  She has a significant great vessel, and splanchnic vessel disease: (Dr. Lucky Cowboy from [facility]) - > despite having all of this vascular disease noted, she has never had cardiac evaluation.  Right carotid CEA (2007)   Right subclavian artery PTA (6 mm balloon) - Jan 9833, complicated by left sided weakness  -> right hemispheric stroke  Celiac artery PTA (6 mm balloon) -August 2016; also noted SMA occlusion  Lower Extremity Arterial Duplex February 2012: Normal ABIs.  No evidence of significant arterial disease bilaterally.  [name] was seen in Sept 2018 for "PRE-OP EVALUATION" Prior to Orthopedic [facility] -left shoulder arthroplasty.  Recent Hospitalizations:   Left shoulder surgery November 15, 2016  Weakness/fall/rhabdo , September 29-November 20, 2016 --> following discharge from shoulder surgery, was not eating and drinking well.  Did not have strength to get up from the toilet and fell in the bathroom.  Was found hours later. -->  Admitted with rhabdomyolysis treated with IV fluids.  Still denied rehab.  Mild troponin elevation secondary to fall/rhabdo.  October 13-19, 2018: Admitted with hyponatremia following gastroenteritis.  Did not do well with outpatient management.  Treated with IV antibiotics and IV hydration. -->  Post discharge bounce back to ER -> treated with IV fluids with electrolyte abnormalities corrected.  November 7-21, 2018 Admitted to [facility]: Chronic mesenteric  ischemia/gastroenteritis --> CTA showed chronic SMA occlusion with distal reconstitution and enteritis.  Celiac artery showed moderate stenosis and IMA was patent.  Colonoscopy revealed chronic colitis.  Studies Personally Reviewed - (if available, images/films reviewed: From Epic Chart or Care Everywhere)  Echo 02/2013 [facility]): No source of CVA.  EF 60-65% with normal LV function.  Normal right-sided function.  Mild aortic sclerosis without stenosis  Echo 10/2016: Normal LV wall thickness with LVEF 60-65% and grade 1 diastolic dysfunction. Mildly calcified mitral annulus and leaflets.  Aortic sclerosis, no stenosis Sclerotic aortic valve. Trivial tricuspid regurgitation.  10/2016: Carotid Doppler: R ICA moderate (non-obstructive plaque) ; L ICA ~50-69%, but may underestimate. (reassess next with either  CTA versus formal cerebral angiogram)   Interval History:  [name] is referred here for postoperative cardiology evaluation really with no active cardiac symptoms to speak of.  The plan for this follow-up visit was to consider coronary ischemic evaluation, however she has had a very complicated post hospital course and I would like to probably give her some time to recover.  She would be due to have carotid Dopplers done. She has not really been all that active, and therefore is not at no time if she has had any exertional chest tightness or pressure.  She has had chronic exertional dyspnea from COPD and that has not changed.  No PND, orthopnea or edema.  Because of her current hospitalization as noted above, she is not really been all that active.  Apparently she had more than IBS with her intermittent constipation and diarrhea.  She did go to the details, (I spent about  a half an hour reviewing her chart from last I saw her) but basically told me that those symptoms have improved and she is starting to eat a little better.  Her blood pressures have never been as high as they were today, and she is  positive that she probably did not take her medicines correctly this morning.Marland Kitchen  Despite that she is not noticing any lightheadedness or dizziness.  No syncope/near syncope or TIA/emesis fugax.  Even the fall when she had rhabdomyolysis was not related to dizziness it was simply due to weakness.  Unfortunately, she has not been on to get back into her Water aerobics exercises.  She has not had any melena or hematochezia last discharge. She denies any rapid irregular heartbeats or palpitations.  No TIA or amaurosis fugax symptoms.  She has not been doing enough walking to determine any evidence of any claudication either.  ROS: A comprehensive was performed. Review of Systems  Constitutional: Positive for malaise/fatigue.  HENT: Negative for congestion and nosebleeds.   Respiratory: Positive for cough, shortness of breath (Baseline for her.) and wheezing.   Genitourinary: Negative for hematuria.  Musculoskeletal: Positive for back pain and joint pain (Arthritis pains left shoulder still sore, back is sore. -Knees and hips.).  Skin:       Diffuse mild ecchymosis from multiple blood draws and IVs.  Right leg wound is healing.  Neurological: Positive for weakness (Global weakness since her surgery).  Psychiatric/Behavioral: Positive for memory loss. Negative for depression. The patient has insomnia. The patient is not nervous/anxious.   All other systems reviewed and are negative.   I have reviewed and (if needed) personally updated the patient's problem list, medications, allergies, past medical and surgical history, social and family history.   Past Medical History:  Diagnosis Date  . Anxiety   . Arthritis   . Carotid artery disease ([place]) 2007   R CEA; dopplers 10/2016:  R ICA moderate (non-obstructive plaque) ; L ICA ~50-69%, but may underestimate. (reassess next with either  CTA versus formal cerebral angiogram)   . Constipation due to pain medication   . COPD (chronic obstructive pulmonary  disease) ([place])   . Depression   . Diverticulosis   . Fibromyalgia   . Gastritis   . GERD (gastroesophageal reflux disease)   . Head injury   . History of pneumonia   . History of stroke 19/4174   complication of R SubClav A PTA -- R hemispheric CVA  . Hypertension   . Hypothyroidism   . Osteoarthritis   . Peripheral vascular disease ([place])    Bilateral subclavian artery disease (status post R SubClav A PTA).  R side CEA (with US findings of Mod-Severe L CIA stenosis).  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   . Urgency incontinence     Past Surgical History:  Procedure Laterality Date  . ANTERIOR AND POSTERIOR REPAIR  12/2000   Archie Endo 07/04/2010  . ANTERIOR CERVICAL DECOMP/DISCECTOMY FUSION N/A 04/03/2012   Procedure: ANTERIOR CERVICAL DECOMPRESSION/DISCECTOMY FUSION 2 LEVELS;  Surgeon: Hosie Spangle, MD;  Location: [place] [facility];  Service: Neurosurgery;  Laterality: N/A;  Cervical four-five,Cervical five-six anterior cervical decompression with fusion plating and bonegraft  . BACK SURGERY    . BREAST SURGERY Right 02/2007   Archie Endo 06/23/2010  . BUNIONECTOMY WITH HAMMERTOE RECONSTRUCTION Left 09/10/2007   Archie Endo 06/23/2010  . CAROTID ENDARTERECTOMY Right 2007   [facility], Dr. Lucky Cowboy  . CATARACT EXTRACTION W/ INTRAOCULAR LENS IMPLANT Left    /  notes 06/23/2010  . CHOLECYSTECTOMY    . COLONOSCOPY N/A 07/15/2014   Procedure: COLONOSCOPY;  Surgeon: Rogene Houston, MD;  [facility];  Service: Endoscopy;  Laterality: N/A;  200 - moved to 2:10 - Ann to notify pt  . COMPRESSION HIP SCREW Right 08/01/2015   Procedure: COMPRESSION HIP;  Surgeon: Carole Civil, MD;  Location: [facility];  Service: Orthopedics;  Laterality: Right;  . ESOPHAGOGASTRODUODENOSCOPY N/A 07/15/2014   Procedure: ESOPHAGOGASTRODUODENOSCOPY (EGD);  Surgeon: Rogene Houston, MD;  [facility];  Service: Endoscopy;  Laterality: N/A;  . EYE SURGERY     cataract /lens both eyes  . FRACTURE  SURGERY    . INCONTINENCE SURGERY  12/2000   Tension-free transvaginal tape procedure/notes 07/04/2010  . JOINT REPLACEMENT    . LUMBAR FUSION  08/2006   Archie Endo 06/23/2010  . PERIPHERAL VASCULAR BALLOON ANGIOPLASTY  02/2013   Aortic arch angiography revealed 70% innominate/R subclavian stenosis along with 60% left common carotid ostial stenosis and moderate left subclavian artery stenosis. ->  Innominate artery PTA with 7 mm balloon reducing stenosis to roughly 30%.  Marland Kitchen PERIPHERAL VASCULAR CATHETERIZATION N/A 10/08/2014   Procedure: Visceral Angiography;  Surgeon: Algernon Huxley, MD;  Location: [place] CV LAB;  Service: Cardiovascular;  - long stenosis/occlusion of SMA. 75% celiac artery (PTA with 6 mm balloon)  . PERIPHERAL VASCULAR CATHETERIZATION N/A 10/08/2014   Procedure: Visceral Artery Intervention;  Surgeon: Algernon Huxley, MD;  Location: [place] [facility] LAB;  Service: Cardiovascular;: 6 mm PTA balloon to stenosed CELIAC ARTERY  . ROTATOR CUFF REPAIR Left X 3  . TOTAL KNEE ARTHROPLASTY Bilateral 2005; 2011   right; left  . TOTAL SHOULDER ARTHROPLASTY Left 11/15/2016  . TOTAL SHOULDER ARTHROPLASTY Left 11/15/2016   Procedure: LEFT TOTAL SHOULDER ARTHROPLASTY;  Surgeon: [name], MD;  Location: [facility];  Service: Orthopedics;  Laterality: Left;  . TRANSTHORACIC ECHOCARDIOGRAM  10/2016   Normal LV wall thickness with LVEF 60-65% and grade 1 diastolic dysfunction.  Aortic valve calcific sclerosis with no stenosis.  Mildly calcified mitral annulus and leaflets.   Marland Kitchen VAGINAL HYSTERECTOMY  12/2000   Archie Endo 07/04/2010  -  Subclavian angioplasty  Current Meds  Medication Sig  . Ascorbic Acid (VITAMIN C) 1000 MG tablet Take 1,000 mg by mouth 2 (two) times daily.   Marland Kitchen aspirin EC 325 MG EC tablet Take 1 tablet (325 mg total) by mouth daily with breakfast.  . diphenhydrAMINE (SOMINEX) 25 MG tablet Take 50 mg by mouth at bedtime.   . DULoxetine (CYMBALTA) 30 MG capsule Take 30 mg by mouth daily.    Marland Kitchen levothyroxine (SYNTHROID, LEVOTHROID) 88 MCG tablet Take 1 tablet (88 mcg total) by mouth daily.  . Multiple Vitamin (MULTIVITAMIN WITH MINERALS) TABS Take 1 tablet by mouth daily.  . sucralfate (CARAFATE) 1 GM/10ML suspension Take 10 mLs (1 g total) by mouth 4 (four) times daily -  with meals and at bedtime. Please use three times daily for the next five days.  . [DISCONTINUED] diltiazem (DILACOR XR) 120 MG 24 hr capsule Take 120 mg by mouth every evening.    Allergies  Allergen Reactions  . Aleve [Naproxen Sodium] Hives and Palpitations  . Hydrocodone Nausea Only  . Statins Other (See Comments)    Muscle pain  . Gluten Meal   . Wheat Bran     Gluten intolerant  . Celebrex [Celecoxib] Nausea And Vomiting  . Codeine Nausea And Vomiting  . Morphine And  Related Nausea And Vomiting    Social History   Socioeconomic History  . Marital status: Widowed    Spouse name: None  . Number of children: 3  . Years of education: 40  . Highest education level: None  Social Needs  . Financial resource strain: None  . Food insecurity - worry: None  . Food insecurity - inability: None  . Transportation needs - medical: None  . Transportation needs - non-medical: None  Occupational History  . 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  Tobacco Use  . Smoking status: Former Smoker    Packs/day: 0.12    Years: 63.00    Pack years: 7.56    Types: Cigarettes  . Smokeless tobacco: Never Used  Substance and Sexual Activity  . Alcohol use: No    Alcohol/week: 0.0 oz  . Drug use: No  . Sexual activity: No    Birth control/protection: Abstinence  Other Topics Concern  . None  Social History Narrative    She is widowed. She is the mother 47 grandchildren. She lives alone. She works as a Electrical engineer for [employer]   . She exercises at the [facility] pole 45 at 60 minutes of time 3 days a week.    family history includes Pancreatic  cancer in her mother; Prostate cancer in her father.  Wt Readings from Last 3 Encounters:  04/17/17 109 lb 12.8 oz (49.8 kg)  02/05/17 110 lb (49.9 kg)  12/13/16 119 lb (54 kg)    PHYSICAL EXAM BP (!) 196/68 (BP Location: Left Arm, Patient Position: Sitting, Cuff Size: Normal)   Pulse 72   Ht 5' 1.5" (1.562 m)   Wt 109 lb 12.8 oz (49.8 kg)   SpO2 90%   BMI 20.41 kg/m   Physical Exam  Constitutional: She is oriented to person, place, and time. She appears well-developed. No distress.  She does look a little bit thin and frail. Appears her stated age. Well groomed  HENT:  Head: Normocephalic and atraumatic.  Mouth/Throat: No oropharyngeal exudate.  Eyes: Conjunctivae and EOM are normal. Pupils are equal, round, and reactive to light. No scleral icterus.  Neck: Trachea normal and normal range of motion. Neck supple. No JVD present. Carotid bruit is present (Left carotid bruit.  Right sided CEA scar noted). No thyroid mass and no thyromegaly present.  Cardiovascular: Normal rate, regular rhythm, S1 normal and S2 normal.  Occasional extrasystoles are present. PMI is not displaced. Exam reveals decreased pulses (Bilateral pedal pulses, also radial pulses are diminished.). Exam reveals no gallop and no friction rub.  Murmur heard.  Medium-pitched harsh crescendo-decrescendo early systolic murmur is present with a grade of 1/6 at the upper right sternal border. Pulmonary/Chest: Effort normal and breath sounds normal. No respiratory distress. She has no wheezes.  Abdominal: Soft. Bowel sounds are normal. She exhibits no distension. There is no tenderness. There is no guarding.  Relatively scaphoid abdomen  Musculoskeletal: Normal range of motion. She exhibits no edema.  Neurological: She is alert and oriented to person, place, and time.  Skin: Skin is warm and dry.  Mild diffuse ecchymosis from IV sticks and lab draws.  Psychiatric: She has a normal mood and affect. Her behavior is normal.  Judgment and thought content normal.     Adult ECG Report None  Other studies Reviewed: Additional studies/ records that were reviewed today include:  Recent Labs:  Lipids not available.  Lab Results  Component Value Date   CREATININE 0.58 12/08/2016   BUN 12 12/08/2016   NA 128 (L) 12/08/2016   K 3.3 (L) 12/08/2016   CL 89 (L) 12/08/2016   CO2 27 12/08/2016     ASSESSMENT / PLAN: Problem List Items Addressed This Visit    Carotid artery disease ([place]) (Chronic)    She would be due for follow-up carotid Dopplers roughly September timeframe.  There was concern about the lesion in the left carotid, again we will hold off on more aggressive evaluation with something like a CT until follow-up Dopplers. Reportedly not on statin because of statin intolerance, but with extensive PAD that she has she needs some type of cholesterol control.  Presumably, she anticipates having her lipids checked in follow-up with her PCP.  Strongly consider at least Zetia as she has extensive vascular disease.      Relevant Medications   diltiazem (CARDIZEM CD) 240 MG 24 hr capsule   Other Relevant Orders   VAS US CAROTID   US Carotid Duplex Bilateral   DOE (dyspnea on exertion) (Chronic)    Most likely related to her COPD.  Echo did not really show anything forthcoming. She also has significant kyphosis which may have some component as well.  She has been followed by Dr. Luan Pulling for primary care and pulmonary medicine.  At her follow-up visit, I anticipate discussing at a minimum coronary CT angiogram.      Relevant Orders   VAS US CAROTID   US Carotid Duplex Bilateral   Essential hypertension - Primary (Chronic)    No evidence of LVH on echo. Plan: Increasing diltiazem to 240 mm daily currently.  Follow-up with PCP in a couple weeks to reassess.  May need to adjust medications (see uncontrolled hypertension section)      Relevant Medications   diltiazem (CARDIZEM CD) 240 MG 24 hr capsule    Other Relevant Orders   VAS US CAROTID   US Carotid Duplex Bilateral   Subclavian artery stenosis ([facility]) (Chronic)    Hopefully, when she has her carotid Dopplers done they can evaluate subclavian's as well.      Relevant Medications   diltiazem (CARDIZEM CD) 240 MG 24 hr capsule   Uncontrolled hypertension    Blood pressure is very high today. --She is only on diltiazem and I will increase her dose to 240 mg daily.  She is due to follow-up with her primary doctor soon and they can reassess blood pressures there.  She probably would benefit from additional blood pressure control with possible ARB versus converting from diltiazem to a dihydropyridine calcium channel blocker such as felodipine or amlodipine.      Relevant Medications   diltiazem (CARDIZEM CD) 240 MG 24 hr capsule     As I mentioned last visit, I do think she probably would benefit from a coronary ischemic evaluation, but I do want to give her some time to recover from her surgery and then several hospitalizations since.  She just does not want to go through any more tests or studies.  We will will hold off on ischemic evaluation at this time.  Complicated follow-up visit with multiple hospitalizations to review as well as procedure notes to review.  Despite only moderate complex medical decision making, the chart review is extensive.   Current medicines are reviewed at length with the patient today. (+/- concerns) n/a The following changes have been made: n/a  Patient Instructions  Increase Diltiazem to 240 mg  daily   Keep appointment with PCP to follow up on elevated blood pressure   Schedule carotid dopplers    Your physician wants you to follow-up in: 1 year. You will receive a reminder letter in the mail two months in advance. If you don't receive a letter, please call our office to schedule the follow-up appointment.    Studies Ordered:   Orders Placed This Encounter  Procedures  . US Carotid Duplex  Bilateral      Glenetta Hew, M.D., M.S. Interventional Cardiologist   Pager # [phone] Phone # [phone] [address] [place], [place] [address]

## 2017-04-17 NOTE — Patient Instructions (Signed)
Increase Diltiazem to 240 mg daily   Keep appointment with PCP to follow up on elevated blood pressure   Schedule carotid dopplers    Your physician wants you to follow-up in: 1 year. You will receive a reminder letter in the mail two months in advance. If you don't receive a letter, please call our office to schedule the follow-up appointment.

## 2017-04-18 ENCOUNTER — Encounter: Payer: Self-pay | Admitting: Cardiology

## 2017-04-19 ENCOUNTER — Encounter: Payer: Self-pay | Admitting: Cardiology

## 2017-04-19 NOTE — Assessment & Plan Note (Signed)
Blood pressure is very high today. --She is only on diltiazem and I will increase her dose to 240 mg daily.  She is due to follow-up with her primary doctor soon and they can reassess blood pressures there.  She probably would benefit from additional blood pressure control with possible ARB versus converting from diltiazem to a dihydropyridine calcium channel blocker such as felodipine or amlodipine.

## 2017-04-19 NOTE — Assessment & Plan Note (Signed)
She would be due for follow-up carotid Dopplers roughly September timeframe.  There was concern about the lesion in the left carotid, again we will hold off on more aggressive evaluation with something like a CT until follow-up Dopplers. Reportedly not on statin because of statin intolerance, but with extensive PAD that she has she needs some type of cholesterol control.  Presumably, she anticipates having her lipids checked in follow-up with her PCP.  Strongly consider at least Zetia as she has extensive vascular disease.

## 2017-04-19 NOTE — Assessment & Plan Note (Signed)
Most likely related to her COPD.  Echo did not really show anything forthcoming. She also has significant kyphosis which may have some component as well.  She has been followed by Dr. Luan Pulling for primary care and pulmonary medicine.  At her follow-up visit, I anticipate discussing at a minimum coronary CT angiogram.

## 2017-04-19 NOTE — Assessment & Plan Note (Signed)
No evidence of LVH on echo. Plan: Increasing diltiazem to 240 mm daily currently.  Follow-up with PCP in a couple weeks to reassess.  May need to adjust medications (see uncontrolled hypertension section)

## 2017-04-19 NOTE — Assessment & Plan Note (Signed)
Hopefully, when she has her carotid Dopplers done they can evaluate subclavian's as well.

## 2017-04-23 ENCOUNTER — Other Ambulatory Visit: Payer: Self-pay | Admitting: Internal Medicine

## 2017-04-23 ENCOUNTER — Ambulatory Visit (HOSPITAL_COMMUNITY)
Admission: RE | Admit: 2017-04-23 | Discharge: 2017-04-23 | Disposition: A | Discharge status: Home / Self Care | Payer: Medicare HMO | Source: Ambulatory Visit | Attending: Cardiology | Admitting: Cardiology

## 2017-04-23 DIAGNOSIS — L97921 Non-pressure chronic ulcer of unspecified part of left lower leg limited to breakdown of skin: Secondary | ICD-10-CM

## 2017-04-23 DIAGNOSIS — I6523 Occlusion and stenosis of bilateral carotid arteries: Secondary | ICD-10-CM | POA: Diagnosis not present

## 2017-04-23 DIAGNOSIS — I10 Essential (primary) hypertension: Secondary | ICD-10-CM | POA: Insufficient documentation

## 2017-04-23 DIAGNOSIS — R069 Unspecified abnormalities of breathing: Secondary | ICD-10-CM | POA: Insufficient documentation

## 2017-04-23 DIAGNOSIS — R0609 Other forms of dyspnea: Secondary | ICD-10-CM | POA: Diagnosis present

## 2017-04-26 ENCOUNTER — Ambulatory Visit (HOSPITAL_COMMUNITY): Admission: RE | Admit: 2017-04-26 | Payer: PPO | Source: Ambulatory Visit

## 2017-04-26 ENCOUNTER — Other Ambulatory Visit: Payer: Self-pay | Admitting: Internal Medicine

## 2017-05-14 DIAGNOSIS — M19012 Primary osteoarthritis, left shoulder: Secondary | ICD-10-CM | POA: Diagnosis not present

## 2017-05-16 DIAGNOSIS — J449 Chronic obstructive pulmonary disease, unspecified: Secondary | ICD-10-CM | POA: Diagnosis not present

## 2017-05-16 DIAGNOSIS — I10 Essential (primary) hypertension: Secondary | ICD-10-CM | POA: Diagnosis not present

## 2017-05-16 DIAGNOSIS — Z23 Encounter for immunization: Secondary | ICD-10-CM | POA: Diagnosis not present

## 2017-05-16 DIAGNOSIS — E785 Hyperlipidemia, unspecified: Secondary | ICD-10-CM | POA: Diagnosis not present

## 2017-05-16 DIAGNOSIS — E039 Hypothyroidism, unspecified: Secondary | ICD-10-CM | POA: Diagnosis not present

## 2017-05-23 ENCOUNTER — Telehealth (HOSPITAL_COMMUNITY): Payer: Self-pay | Admitting: Physical Therapy

## 2017-05-23 NOTE — Telephone Encounter (Signed)
HealthTeam Adv has been cx and pt is working on why. She will call us back to r/s after ins is worked out

## 2017-05-24 ENCOUNTER — Ambulatory Visit (HOSPITAL_COMMUNITY): Payer: PPO

## 2017-05-24 ENCOUNTER — Encounter (HOSPITAL_COMMUNITY): Payer: Self-pay

## 2017-06-01 ENCOUNTER — Ambulatory Visit (HOSPITAL_COMMUNITY): Payer: PPO

## 2017-06-05 ENCOUNTER — Ambulatory Visit (HOSPITAL_COMMUNITY): Payer: Medicare HMO | Attending: Orthopedic Surgery | Admitting: Physical Therapy

## 2017-06-05 ENCOUNTER — Encounter (HOSPITAL_COMMUNITY): Payer: Self-pay | Admitting: Physical Therapy

## 2017-06-05 ENCOUNTER — Other Ambulatory Visit: Payer: Self-pay

## 2017-06-05 ENCOUNTER — Telehealth (HOSPITAL_COMMUNITY): Payer: Self-pay

## 2017-06-05 DIAGNOSIS — M6281 Muscle weakness (generalized): Secondary | ICD-10-CM | POA: Diagnosis not present

## 2017-06-05 DIAGNOSIS — M25512 Pain in left shoulder: Secondary | ICD-10-CM | POA: Diagnosis not present

## 2017-06-05 DIAGNOSIS — M546 Pain in thoracic spine: Secondary | ICD-10-CM | POA: Diagnosis not present

## 2017-06-05 DIAGNOSIS — M25612 Stiffness of left shoulder, not elsewhere classified: Secondary | ICD-10-CM | POA: Insufficient documentation

## 2017-06-05 DIAGNOSIS — R29898 Other symptoms and signs involving the musculoskeletal system: Secondary | ICD-10-CM | POA: Diagnosis not present

## 2017-06-05 NOTE — Therapy (Addendum)
[place], [place], [address] Phone: [phone]   Fax:  [phone]  Physical Therapy Evaluation  Patient Details  Name: [name] MRN: [id number] Date of Birth: [birth year] Referring Provider: Renette Butters, MD   Encounter Date: 06/05/2017  PT End of Session - 06/05/17 1557    Visit Number  1    Number of Visits  17    Date for PT Re-Evaluation  07/31/17 Mini-reassess 07/03/17    Authorization Type  [employer] (No auth required)    Authorization Time Period  06/05/17 - 07/31/17    Authorization - Visit Number  1    Authorization - Number of Visits  10    PT Start Time  7253    PT Stop Time  1345    PT Time Calculation (min)  40 min    Activity Tolerance  Patient tolerated treatment well    Behavior During Therapy  [facility] for tasks assessed/performed       Past Medical History:  Diagnosis Date  . Anxiety   . Arthritis   . Carotid artery disease ([place]) 2007   R CEA; dopplers 10/2016:  R ICA moderate (non-obstructive plaque) ; L ICA ~50-69%, but may underestimate. (reassess next with either  CTA versus formal cerebral angiogram)   . Constipation due to pain medication   . COPD (chronic obstructive pulmonary disease) ([place])   . Depression   . Diverticulosis   . Fibromyalgia   . Gastritis   . GERD (gastroesophageal reflux disease)   . Head injury   . History of pneumonia   . History of stroke 80/4403   complication of R SubClav A PTA -- R hemispheric CVA  . Hypertension   . Hypothyroidism   . Osteoarthritis   . Peripheral vascular disease ([place])    Bilateral subclavian artery disease (status post R SubClav A PTA).  R side CEA (with US findings of Mod-Severe L CIA stenosis).  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   . Urgency incontinence     Past Surgical History:  Procedure Laterality Date  . ANTERIOR AND POSTERIOR REPAIR  12/2000   Archie Endo 07/04/2010  . ANTERIOR  CERVICAL DECOMP/DISCECTOMY FUSION N/A 04/03/2012   Procedure: ANTERIOR CERVICAL DECOMPRESSION/DISCECTOMY FUSION 2 LEVELS;  Surgeon: Hosie Spangle, MD;  Location: [facility];  Service: Neurosurgery;  Laterality: N/A;  Cervical four-five,Cervical five-six anterior cervical decompression with fusion plating and bonegraft  . BACK SURGERY    . BREAST SURGERY Right 02/2007   Archie Endo 06/23/2010  . BUNIONECTOMY WITH HAMMERTOE RECONSTRUCTION Left 09/10/2007   Archie Endo 06/23/2010  . CAROTID ENDARTERECTOMY Right 2007   [facility], Dr. Lucky Cowboy  . CATARACT EXTRACTION W/ INTRAOCULAR LENS IMPLANT Left    Ar[facility] Endo 06/23/2010  . CHOLECYSTECTOMY    . COLONOSCOPY N/A 07/15/2014   Procedure: COLONOSCOPY;  Surgeon: Rogene Houston, MD;  [facility];  Service: Endoscopy;  Laterality: N/A;  200 - moved to 2:10 - Ann to notify pt  . COMPRESSION HIP SCREW Right 08/01/2015   Procedure: COMPRESSION HIP;  Surgeon: Carole Civil, MD;  Location: [facility];  Service: Orthopedics;  Laterality: Right;  . ESOPHAGOGASTRODUODENOSCOPY N/A 07/15/2014   Procedure: ESOPHAGOGASTRODUODENOSCOPY (EGD);  Surgeon: Rogene Houston, MD;  [facility];  Service: Endoscopy;  Laterality: N/A;  . EYE SURGERY     cataract /lens both eyes  . FRACTURE SURGERY    . INCONTINENCE SURGERY  12/2000  Tension-free transvaginal tape procedure/notes 07/04/2010  . JOINT REPLACEMENT    . LUMBAR FUSION  08/2006   Archie Endo 06/23/2010  . PERIPHERAL VASCULAR BALLOON ANGIOPLASTY  02/2013   Aortic arch angiography revealed 70% innominate/R subclavian stenosis along with 60% left common carotid ostial stenosis and moderate left subclavian artery stenosis. ->  Innominate artery PTA with 7 mm balloon reducing stenosis to roughly 30%.  Marland Kitchen PERIPHERAL VASCULAR CATHETERIZATION N/A 10/08/2014   Procedure: Visceral Angiography;  Surgeon: Algernon Huxley, MD;  Location: [facility];  Service: Cardiovascular;  - long stenosis/occlusion of SMA. 75% celiac artery  (PTA with 6 mm balloon)  . PERIPHERAL VASCULAR CATHETERIZATION N/A 10/08/2014   Procedure: Visceral Artery Intervention;  Surgeon: Algernon Huxley, MD;  Location: [facility];  Service: Cardiovascular;: 6 mm PTA balloon to stenosed CELIAC ARTERY  . ROTATOR CUFF REPAIR Left X 3  . TOTAL KNEE ARTHROPLASTY Bilateral 2005; 2011   right; left  . TOTAL SHOULDER ARTHROPLASTY Left 11/15/2016  . TOTAL SHOULDER ARTHROPLASTY Left 11/15/2016   Procedure: LEFT TOTAL SHOULDER ARTHROPLASTY;  Surgeon: [name], MD;  Location: [facility];  Service: Orthopedics;  Laterality: Left;  . TRANSTHORACIC ECHOCARDIOGRAM  10/2016   Normal LV wall thickness with LVEF 60-65% and grade 1 diastolic dysfunction.  Aortic valve calcific sclerosis with no stenosis.  Mildly calcified mitral annulus and leaflets.   Marland Kitchen VAGINAL HYSTERECTOMY  12/2000   Archie Endo 07/04/2010    There were no vitals filed for this visit.   Subjective Assessment - 06/05/17 1312         06/05/17 1312  Symptoms/Limitations  Subjective Patient reported that she has had mid back pain for many years. She indicated that the pain is right above her waist and that the pain goes across her middle back. Patient stated that her back pain increases with walking and sitting on hard surfaces. Patient denied pain currently, but reported that her back pain is a dull pain when it does occur. Patient stated that over the last week that her back pain got up to an 8/10 while she was walking. Patient reported that she had back surgery a long time ago. She also reported having had neck surgery a while ago, but does not provide a specific date. Patient reported that she fell and fractured below her right hip joint and that they did surgery to repair this in 2017. Patient denied any tingling or numbness in her arms and legs. Patient reported that she has colitis which has caused her to have frequent bowel movements and vomiting, but no changes which have coincided with  changes in back pain. Patient reported that her left shoulder has been painful since her left total shoulder replacement on 11/15/16. Patient stated that she does her exercises and that the next day her shoulder will be sore. Patient stated she received physical therapy for her left shoulder following surgery at home. Patient stated that she cannot get her hand around her back to fasten her bra or to reach behind her head to straighten her shirt collar. Patient stated she has trouble with lifting things, and carrying things at shoulder level. Patient reported difficulty with household activities. At its worst the soreness in her left shoulder can reach a 7/10. Patient reported a history of falls, but none in the last 6 months.   Limitations Walking;Standing;Lifting;House hold activities  How long can you sit comfortably? Not limited   How long can you stand comfortably? 5 minutes  How  long can you walk comfortably? Half a block; 10 minutes  Diagnostic tests X-ray of left shoulder 11/18/16 "No fracture or dislocation"; X-ray hips 07/18/16 showed old ORIF on the right; X-ray lumbar spine 07/18/16 "No evidence of acuted fracture or dislocation. Mild to moderated degenerative changes most prominent at L1-2."  Patient Stated Goals To get more motion in her left shoulder and to be able to walk longer  Pain Assessment  Currently in Pain? No/denies     [facility] PT Assessment - 06/05/17 0001      Assessment   Medical Diagnosis  lumbago; Left reverse total shoulder arthroplasty     Referring Provider  Renette Butters, MD    Onset Date/Surgical Date  -- Shoulder surgery 11/15/16    Hand Dominance  Left    Next MD Visit  May 2019, unsure of day    Prior Therapy  Yes. For her back, and shoulder at home health therapy      Precautions   Precautions  None      Restrictions   Weight Bearing Restrictions  No      Balance Screen   Has the patient fallen in the past 6 months  No    Has the patient had a decrease  in activity level because of a fear of falling?   Yes      [place] residence    Living Arrangements  Alone    Type of [place] to enter    Entrance Stairs-Number of Steps  3    [place]  One level    [place] - quad;Walker - 2 wheels;Shower seat - built in;Grab bars - tub/shower;Grab bars - toilet;Toilet riser      Prior Function   Level of Independence  Independent;Independent with basic ADLs    Vocation  Retired    Landscape architect, read knit      Cognition   Overall Cognitive Status  Within Functional Limits for tasks assessed      Observation/Other Assessments   Focus on Therapeutic Outcomes (FOTO)   38% (62% limited)      Sensation   Light Touch  Not tested Patient denied any tingling or numbness      Other:   Other/ Comments  Lifting empty box to shoulder level. Patient demonstrated decreased range of motion with this and reported pain and difficulty.       Posture/Postural Control   Posture Comments  Significant Thoracic kyphosis, rounded shoulders      AROM   Right Shoulder Flexion  130 Degrees    Right Shoulder ABduction  135 Degrees (150 PROM)    Right Shoulder Internal Rotation  -- Reaches T10    Right Shoulder External Rotation  -- Unable to reach base of neck    Left Shoulder Flexion  100 Degrees (110 PROM)    Left Shoulder ABduction  90 Degrees (115 PROM)    Left Shoulder Internal Rotation  -- Reaches L5     Left Shoulder External Rotation  -- Unable to reach base of neck    Lumbar Flexion  WNL    Lumbar Extension  Moderately limited    Lumbar - Right Side Bend  Able to reach knee joint    Lumbar - Left Side Bend  Able to reach knee joint    Lumbar - Right Rotation  Moderately  limited    Lumbar - Left Rotation  Moderately limited    Thoracic Flexion  WNL     Thoracic Extension  Moderately limited    Thoracic - Right Side Bend  WNL    Thoracic  - Left Side Bend  WNL    Thoracic - Right Rotation  WNL    Thoracic - Left Rotation  WNL      Strength   Right/Left Shoulder  Right;Left    Right Shoulder Flexion  5/5    Right Shoulder Extension  4+/5    Right Shoulder ABduction  4+/5    Right Shoulder Internal Rotation  4+/5    Right Shoulder External Rotation  4+/5    Left Shoulder Flexion  4+/5    Left Shoulder Extension  4+/5    Left Shoulder ABduction  4+/5    Left Shoulder Internal Rotation  4+/5    Left Shoulder External Rotation  3-/5    Right/Left Elbow  Right;Left    Right Elbow Flexion  4+/5    Right Elbow Extension  4+/5    Left Elbow Flexion  4+/5    Left Elbow Extension  4+/5    Right/Left Hip  Right;Left    Right Hip Flexion  4/5    Right Hip Extension  2+/5    Right Hip ABduction  4/5    Left Hip Flexion  4/5    Left Hip Extension  2+/5    Left Hip ABduction  4/5    Right/Left Knee  Right;Left    Right Knee Flexion  5/5    Right Knee Extension  5/5    Left Knee Flexion  5/5    Left Knee Extension  5/5    Right/Left Ankle  Right;Left    Right Ankle Dorsiflexion  5/5    Left Ankle Dorsiflexion  5/5      Palpation   Spinal mobility  Generally hypomobile throughtout with posterior to anterior spinal mobility assessment    Palpation comment  Noted 2 elevated vertebrae in thoracic region      Standardized Balance Assessment   Five times sit to stand comments   Patient performed in 20.57 seconds with intermittent use of upper extremities                Objective measurements completed on examination: See above findings.              PT Education - 06/05/17 1556    Education provided  Yes    Education Details  Patient was educated on examination findings and plan of care.     Person(s) Educated  Patient    Methods  Explanation    Comprehension  Verbalized understanding       PT Short Term Goals - 06/05/17 2128      PT SHORT TERM GOAL #1   Title  Patient will demonstrate  understanding and report regular compliance with home exercise program to improve strength, range of motion, and decrease pain.     Time  4    Period  Weeks    Status  New    Target Date  07/03/17      PT SHORT TERM GOAL #2   Title  Patient will demonstrate improvement of 1/2 Grade MMT strength in all musculature tested as deficient at evaluation to assist patient with lifting activities, functional squatting, and other functional mobility.     Time  4    Period  Weeks  Status  New    Target Date  07/03/17      PT SHORT TERM GOAL #3   Title  Patient will report no greater than a 5/10 pain in her left shoulder or back over the course of a 1 week period indicating better tolerance with daily activities.     Time  4    Period  Weeks    Status  New    Target Date  07/03/17      PT SHORT TERM GOAL #4   Title  Patient will demonstrate improvement of 10 degrees in left shoulder active range of motion in all movements measured as deficient at evaluation to assist patient with lifting activities, and patient will be able to perform external rotation of left shoulder to touch base of neck and internal rotation of left shoulder to reach T5 for increased ease of adjusting clothing.     Time  4    Period  Weeks    Status  New    Target Date  07/03/17        PT Long Term Goals - 06/05/17 2142      PT LONG TERM GOAL #1   Title  Patient will demonstrate improvement of 1 Grade MMT strength in all musculature tested as deficient at evaluation to assist patient with lifting activities, functional squatting, and other functional mobility.     Time  8    Period  Weeks    Status  New    Target Date  07/31/17      PT LONG TERM GOAL #2   Title  Patient will demonstrate improvement of 20 degrees in left shoulder active range of motion in all movements measured as deficient at evaluation to assist patient with lifting activities.     Time  8    Period  Weeks    Status  New    Target Date   07/31/17      PT LONG TERM GOAL #3   Title  Patient will perform the 5 times sit to stand test in 13 seconds or less indicating improved balance, decreased risk of falls, and overall improved functional mobility.     Time  8    Period  Weeks    Status  New    Target Date  07/31/17      PT LONG TERM GOAL #4   Title  Patient will perform lifting 1 box with 5 pound weight to shoulder level without increase of shoulder pain indicating improved ability to perform lifting in daily routine.     Time  8    Period  Weeks    Status  New    Target Date  07/31/17      PT LONG TERM GOAL #5   Title  Patient will report no greater than a 3/10 pain in either her left shoulder or back over the course of a 1 week period indicating improved tolerance to daily activities.     Time  8    Period  Weeks    Status  New    Target Date  07/31/17             Plan - 06/05/17 2201    Clinical Impression Statement  Patient is a 82 year old female who presented to physical therapy with complaints of recent left shoulder pain and mid-back pain. Upon examination noted decreased range of motion particularly in patient's left shoulder. Also upon examination, noted decreased strength in patient's  upper and lower extremities. With performance of the 5 times sit to stand patient demonstrated decreased balance and functional mobility. With upper extremity lifting patient demonstrated poor form and increased pain. Upon palpation of patient's spine noted hypomobility as well as 2 very prominent raised vertebrae. Observation of patient's posture revealed several deviations including thoracic kyphosis and rounded shoulders. Patient would benefit from physical therapy to address the abovementioned deficits, improve patient's overall functional mobility and quality of life.     History and Personal Factors relevant to plan of care:  S/P Left reverse total shoulder arthroplasty, CAD, HTN, TIA, COPD, CVA, Intertrochenteric  fracture of right hip    Clinical Presentation  Stable    Clinical Presentation due to:  MMT, FOTO, 5xSTS, ROM, clinical judgement    Clinical Decision Making  High    Rehab Potential  Fair    Clinical Impairments Affecting Rehab Potential  Positive: Patient's motivation; Negative: comorbidities, chronicity of pain    PT Frequency  2x / week    PT Duration  8 weeks    PT Treatment/Interventions  ADLs/Self Care Home Management;Aquatic Therapy;Cryotherapy;Electrical Stimulation;Moist Heat;DME Instruction;Gait training;Stair training;Functional mobility training;Therapeutic activities;Therapeutic exercise;Balance training;Neuromuscular re-education;Patient/family education;Manual techniques;Passive range of motion;Dry needling;Energy conservation;Taping    PT Next Visit Plan  Review patient's evaluation and goals; initiate HEP; begin postural exercises; ROM exercises for left shoulder; Lower extremity functional strengthening; ask patient what she would like to focus on first shoulder or back    PT Home Exercise Plan  Initiate first treatment session    Consulted and Agree with Plan of Care  Patient       Patient will benefit from skilled therapeutic intervention in order to improve the following deficits and impairments:  Increased fascial restricitons, Improper body mechanics, Pain, Decreased mobility, Postural dysfunction, Decreased activity tolerance, Decreased endurance, Decreased range of motion, Decreased strength, Hypomobility, Impaired UE functional use, Decreased balance, Difficulty walking  Visit Diagnosis: Left shoulder pain, unspecified chronicity  Pain in thoracic spine  Other symptoms and signs involving the musculoskeletal system  Stiffness of left shoulder, not elsewhere classified  Muscle weakness (generalized)     Problem List Patient Active Problem List   Diagnosis Date Noted  . Acute colitis 12/02/2016  . Uncontrolled hypertension 12/02/2016  . Enteritis  12/02/2016  . Hyponatremia 12/02/2016  . Status post total shoulder arthroplasty, left 11/21/2016  . COPD (chronic obstructive pulmonary disease) ([place]) 11/21/2016  . Weakness 11/19/2016  . DJD of left shoulder 11/15/2016  . DOE (dyspnea on exertion) 11/11/2016  . Rotator cuff arthropathy of left shoulder 10/30/2016  . Intertrochanteric fracture of right hip ([place]) 08/01/2015    Class: Acute  . SMA stenosis ([place]) 09/15/2014  . Abdominal pain, right upper quadrant 09/15/2014  . Essential hypertension 09/15/2014  . Depression 09/15/2014  . Hypothyroid 09/15/2014  . GERD without esophagitis 09/15/2014  . Lumbago 11/12/2013  . Stiffness of joint, not elsewhere classified, pelvic region and thigh 11/12/2013  . Muscle weakness (generalized) 11/12/2013  . Lack of coordination 03/21/2013  . Fine motor impairment 03/21/2013  . Left leg weakness 03/17/2013  . Risk for falls 03/17/2013  . Carotid artery disease ([place]) 03/11/2013  . Subclavian artery stenosis ([place]) 03/11/2013  . TIA (transient ischemic attack) 03/11/2013  . Tobacco use disorder 03/11/2013  . CVA (cerebral infarction) 03/05/2013  . Pain in joint, shoulder region 11/01/2010   Clarene Critchley PT, DPT 10:07 PM, 06/05/17 [place], [place], [address]  Phone: [phone]   Fax:  [phone]  Name: ARYA LUTTRULL MRN: [id number] Date of Birth: 04-26-1935

## 2017-06-07 ENCOUNTER — Encounter (HOSPITAL_COMMUNITY): Payer: Self-pay | Admitting: Physical Therapy

## 2017-06-07 ENCOUNTER — Ambulatory Visit (HOSPITAL_COMMUNITY): Payer: Medicare HMO | Admitting: Physical Therapy

## 2017-06-07 DIAGNOSIS — M25612 Stiffness of left shoulder, not elsewhere classified: Secondary | ICD-10-CM | POA: Diagnosis not present

## 2017-06-07 DIAGNOSIS — M546 Pain in thoracic spine: Secondary | ICD-10-CM | POA: Diagnosis not present

## 2017-06-07 DIAGNOSIS — R29898 Other symptoms and signs involving the musculoskeletal system: Secondary | ICD-10-CM

## 2017-06-07 DIAGNOSIS — M6281 Muscle weakness (generalized): Secondary | ICD-10-CM | POA: Diagnosis not present

## 2017-06-07 DIAGNOSIS — M25512 Pain in left shoulder: Secondary | ICD-10-CM

## 2017-06-07 NOTE — Patient Instructions (Signed)
  WALL WALK Place your affected hand on the wall with the palm facing the wall. Next, walk your fingers up the wall towards overhead. Lastly, slide your hand back down the wall to the starting Position. Repeat 15 Times Hold 5 Seconds Complete 1 Set Perform 1 Time(s) a Day   Pec Stretch Standing at a door way Abduct the shoulder to 90 degrees bend the elbow to 90 degrees Palm of your hand on the wall/door frame Gently rotate to the opposite side hold stretch for ~20 seconds  Repeat 5 Times Hold 20 Seconds Complete 1 Set Perform 2 Time(s) a Day   ELASTIC BAND EXTENSION BILATERAL SHOULDER While holding an elastic band with both arms in front of you with your elbows straight, pull the band downwards and back towards your Side.  Repeat 10 Times Hold 1 Second Complete 2 Sets Perform 1 Time(s) a Day   ELASTIC BAND ROWS Holding elastic band with both hands, draw back the band as you bend your elbows. Keep your elbows near the side of your body.  Repeat 10 Times Hold 1 Second Complete 2 Sets Perform 1 Time(s) a Day   Scapular Retraction Wrap an elastic band around a door knob or banister. Grab the ends of the band with both hands with your arms extended. With good posture, pull the band backwards and squeeze your shoulder blades together for 3 seconds. Make sure your elbows stay close to your body.  Repeat 10 Times Hold 1 Second Complete 2 Sets Perform 1 Time(s) a Day

## 2017-06-07 NOTE — Therapy (Signed)
[place], [place], [address] Phone: [phone]   Fax:  [phone]  Physical Therapy Treatment  Patient Details  Name: [name] MRN: [id number] Date of Birth: Mar 26, 1935 Referring Provider: Renette Butters, MD   Encounter Date: 06/07/2017  PT End of Session - 06/07/17 1709    Visit Number  2    Number of Visits  17    Date for PT Re-Evaluation  07/31/17 Mini-reassess 07/03/17    Authorization Type  [employer] (No auth required)    Authorization Time Period  06/05/17 - 07/31/17    Authorization - Visit Number  2    Authorization - Number of Visits  10    PT Start Time  1606    PT Stop Time  1645    PT Time Calculation (min)  39 min    Activity Tolerance  Patient tolerated treatment well    Behavior During Therapy  [facility] for tasks assessed/performed       Past Medical History:  Diagnosis Date  . Anxiety   . Arthritis   . Carotid artery disease ([place]) 2007   R CEA; dopplers 10/2016:  R ICA moderate (non-obstructive plaque) ; L ICA ~50-69%, but may underestimate. (reassess next with either  CTA versus formal cerebral angiogram)   . Constipation due to pain medication   . COPD (chronic obstructive pulmonary disease) ([place])   . Depression   . Diverticulosis   . Fibromyalgia   . Gastritis   . GERD (gastroesophageal reflux disease)   . Head injury   . History of pneumonia   . History of stroke 42/6834   complication of R SubClav A PTA -- R hemispheric CVA  . Hypertension   . Hypothyroidism   . Osteoarthritis   . Peripheral vascular disease ([place])    Bilateral subclavian artery disease (status post R SubClav A PTA).  R side CEA (with US findings of Mod-Severe L CIA stenosis).  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   . Urgency incontinence     Past Surgical History:  Procedure Laterality Date  . ANTERIOR AND POSTERIOR REPAIR  12/2000   Archie Endo 07/04/2010  . ANTERIOR  CERVICAL DECOMP/DISCECTOMY FUSION N/A 04/03/2012   Procedure: ANTERIOR CERVICAL DECOMPRESSION/DISCECTOMY FUSION 2 LEVELS;  Surgeon: Hosie Spangle, MD;  Location: [place] [facility];  Service: Neurosurgery;  Laterality: N/A;  Cervical four-five,Cervical five-six anterior cervical decompression with fusion plating and bonegraft  . BACK SURGERY    . BREAST SURGERY Right 02/2007   Archie Endo 06/23/2010  . BUNIONECTOMY WITH HAMMERTOE RECONSTRUCTION Left 09/10/2007   Archie Endo 06/23/2010  . CAROTID ENDARTERECTOMY Right 2007   [facility], Dr. Lucky Cowboy  . CATARACT EXTRACTION W/ INTRAOCULAR LENS IMPLANT Left    Archie Endo 06/23/2010  . CHOLECYSTECTOMY    . COLONOSCOPY N/A 07/15/2014   Procedure: COLONOSCOPY;  Surgeon: Rogene Houston, MD;  [facility];  Service: Endoscopy;  Laterality: N/A;  200 - moved to 2:10 - Ann to notify pt  . COMPRESSION HIP SCREW Right 08/01/2015   Procedure: COMPRESSION HIP;  Surgeon: Carole Civil, MD;  Location: [facility];  Service: Orthopedics;  Laterality: Right;  . ESOPHAGOGASTRODUODENOSCOPY N/A 07/15/2014   Procedure: ESOPHAGOGASTRODUODENOSCOPY (EGD);  Surgeon: Rogene Houston, MD;  [facility];  Service: Endoscopy;  Laterality: N/A;  . EYE SURGERY     cataract /lens both eyes  . FRACTURE SURGERY    . INCONTINENCE SURGERY  12/2000  Tension-free transvaginal tape procedure/notes 07/04/2010  . JOINT REPLACEMENT    . LUMBAR FUSION  08/2006   Archie Endo 06/23/2010  . PERIPHERAL VASCULAR BALLOON ANGIOPLASTY  02/2013   Aortic arch angiography revealed 70% innominate/R subclavian stenosis along with 60% left common carotid ostial stenosis and moderate left subclavian artery stenosis. ->  Innominate artery PTA with 7 mm balloon reducing stenosis to roughly 30%.  Marland Kitchen PERIPHERAL VASCULAR CATHETERIZATION N/A 10/08/2014   Procedure: Visceral Angiography;  Surgeon: Algernon Huxley, MD;  Location: [place] CV LAB;  Service: Cardiovascular;  - long stenosis/occlusion of SMA. 75% celiac artery  (PTA with 6 mm balloon)  . PERIPHERAL VASCULAR CATHETERIZATION N/A 10/08/2014   Procedure: Visceral Artery Intervention;  Surgeon: Algernon Huxley, MD;  Location: [place] [facility];  Service: Cardiovascular;: 6 mm PTA balloon to stenosed CELIAC ARTERY  . ROTATOR CUFF REPAIR Left X 3  . TOTAL KNEE ARTHROPLASTY Bilateral 2005; 2011   right; left  . TOTAL SHOULDER ARTHROPLASTY Left 11/15/2016  . TOTAL SHOULDER ARTHROPLASTY Left 11/15/2016   Procedure: LEFT TOTAL SHOULDER ARTHROPLASTY;  Surgeon: [name], MD;  Location: [facility];  Service: Orthopedics;  Laterality: Left;  . TRANSTHORACIC ECHOCARDIOGRAM  10/2016   Normal LV wall thickness with LVEF 60-65% and grade 1 diastolic dysfunction.  Aortic valve calcific sclerosis with no stenosis.  Mildly calcified mitral annulus and leaflets.   Marland Kitchen VAGINAL HYSTERECTOMY  12/2000   Archie Endo 07/04/2010    There were no vitals filed for this visit.  Subjective Assessment - 06/07/17 1617    Subjective  Patient stated that she's had soreness in her left shoulder, but denied any current pain.     Limitations  Walking;Standing;Lifting;House hold activities    How long can you sit comfortably?  Not limited     How long can you stand comfortably?  5 minutes    How long can you walk comfortably?  Half a block; 10 minutes    Diagnostic tests  X-ray of left shoulder 11/18/16 "No fracture or dislocation"; X-ray hips 07/18/16 showed old ORIF on the right; X-ray lumbar spine 07/18/16 "No evidence of acuted fracture or dislocation. Mild to moderated degenerative changes most prominent at L1-2."    Patient Stated Goals  To get more motion in her left shoulder and to be able to walk longer    Currently in Pain?  No/denies         [facility] PT Assessment - 06/07/17 0001      AROM   Right Shoulder Internal Rotation  75 Degrees Tested in 90 degree abduction position    Right Shoulder External Rotation  15 Degrees Tested in 90 degree abduction position    Left Shoulder  Internal Rotation  50 Degrees Tested in 90 degree abduction position    Left Shoulder External Rotation  5 Degrees Tested in 90 degree abduction position                   [facility] Adult PT Treatment/Exercise - 06/07/17 0001      Knee/Hip Exercises: Standing   Hip Extension  Stengthening;Right;Left;2 sets;10 reps;Knee straight;Other (comment) Verbal cues to not use momentum 2# ankle weight    Other Standing Knee Exercises  Sit to stands from chair 20 repetitions with rest breaks as needed throughout. Verbal cues for form.       Shoulder Exercises: Standing   Extension  Strengthening;AROM;Right;Left;20 reps;Other (comment) 2 x 10 repetitions with red theraband    Theraband Level (Shoulder Extension)  Level 2 (Red)    Row  Strengthening;Both;AROM;20 reps;Other (comment) 2 x 10 red theraband    Theraband Level (Shoulder Row)  Level 2 (Red)    Retraction  Strengthening;AROM;Both;20 reps;Other (comment) 2 x 10 repetitons with red theraband    Theraband Level (Shoulder Retraction)  Level 2 (Red)    Other Standing Exercises  Wall walk 15x holding for 5 seconds left upper extremity      Shoulder Exercises: Stretch   Other Shoulder Stretches  Pec stretch against wall 5 x 20 seconds each upper extremity             PT Education - 06/07/17 1708    Education provided  Yes    Education Details  Patient was educated on home exercise program and goals as well as purpose and technique of exercises throughout.     Person(s) Educated  Patient    Methods  Explanation;Demonstration;Tactile cues;Verbal cues;Handout    Comprehension  Verbalized understanding;Returned demonstration;Verbal cues required;Tactile cues required;Need further instruction       PT Short Term Goals - 06/05/17 2128      PT SHORT TERM GOAL #1   Title  Patient will demonstrate understanding and report regular compliance with home exercise program to improve strength, range of motion, and decrease pain.     Time  4     Period  Weeks    Status  New    Target Date  07/03/17      PT SHORT TERM GOAL #2   Title  Patient will demonstrate improvement of 1/2 Grade MMT strength in all musculature tested as deficient at evaluation to assist patient with lifting activities, functional squatting, and other functional mobility.     Time  4    Period  Weeks    Status  New    Target Date  07/03/17      PT SHORT TERM GOAL #3   Title  Patient will report no greater than a 5/10 pain in her left shoulder or back over the course of a 1 week period indicating better tolerance with daily activities.     Time  4    Period  Weeks    Status  New    Target Date  07/03/17      PT SHORT TERM GOAL #4   Title  Patient will demonstrate improvement of 10 degrees in left shoulder active range of motion in all movements measured as deficient at evaluation to assist patient with lifting activities, and patient will be able to perform external rotation of left shoulder to touch base of neck and internal rotation of left shoulder to reach T5 for increased ease of adjusting clothing.     Time  4    Period  Weeks    Status  New    Target Date  07/03/17        PT Long Term Goals - 06/05/17 2142      PT LONG TERM GOAL #1   Title  Patient will demonstrate improvement of 1 Grade MMT strength in all musculature tested as deficient at evaluation to assist patient with lifting activities, functional squatting, and other functional mobility.     Time  8    Period  Weeks    Status  New    Target Date  07/31/17      PT LONG TERM GOAL #2   Title  Patient will demonstrate improvement of 20 degrees in left shoulder active range of motion in all movements  measured as deficient at evaluation to assist patient with lifting activities.     Time  8    Period  Weeks    Status  New    Target Date  07/31/17      PT LONG TERM GOAL #3   Title  Patient will perform the 5 times sit to stand test in 13 seconds or less indicating improved balance,  decreased risk of falls, and overall improved functional mobility.     Time  8    Period  Weeks    Status  New    Target Date  07/31/17      PT LONG TERM GOAL #4   Title  Patient will perform lifting 1 box with 5 pound weight to shoulder level without increase of shoulder pain indicating improved ability to perform lifting in daily routine.     Time  8    Period  Weeks    Status  New    Target Date  07/31/17      PT LONG TERM GOAL #5   Title  Patient will report no greater than a 3/10 pain in either her left shoulder or back over the course of a 1 week period indicating improved tolerance to daily activities.     Time  8    Period  Weeks    Status  New    Target Date  07/31/17            Plan - 06/07/17 1717    Clinical Impression Statement  This session began by therapist performing a measurement of patient's shoulder internal and external rotation range of motion and reviewing patient's evaluation and goals. Discussed possibly focusing on either the shoulder or the back, and patient stated that she wasn't sure which she would like to focus on so session initiated exercises for both areas. Patient required frequent demonstration, verbal cues and tactile cues for proper form with exercises this session. With hip extension exercises patient demonstrated momentum strategy without verbal cues. With sit to stands this session patient required rest breaks to achieve 20 repetitions. Patient would benefit from continued skilled physical therapy to address patient's deficits in strength, range of motion, and overall functional mobility.     Rehab Potential  Fair    Clinical Impairments Affecting Rehab Potential  Positive: Patient's motivation; Negative: comorbidities, chronicity of pain    PT Frequency  2x / week    PT Duration  8 weeks    PT Treatment/Interventions  ADLs/Self Care Home Management;Aquatic Therapy;Cryotherapy;Electrical Stimulation;Moist Heat;DME Instruction;Gait  training;Stair training;Functional mobility training;Therapeutic activities;Therapeutic exercise;Balance training;Neuromuscular re-education;Patient/family education;Manual techniques;Passive range of motion;Dry needling;Energy conservation;Taping    PT Next Visit Plan  Review HEP; ROM exercises for left shoulder; Lower extremity functional strengthening. Continue to progress with both shoulder, postural and lower extremity strengthening exercises.     PT Home Exercise Plan  06/07/17: Wall walk left upper extremity 1 x 15 5 second holds 1x/day; Pec stretch 5 x 20 seconds 2x/day each upper extremity; Shoulder extension/rows/scapular retraction with RTB 2 x 10 bilateral upper extremities 1x/day,     Consulted and Agree with Plan of Care  Patient       Patient will benefit from skilled therapeutic intervention in order to improve the following deficits and impairments:  Increased fascial restricitons, Improper body mechanics, Pain, Decreased mobility, Postural dysfunction, Decreased activity tolerance, Decreased endurance, Decreased range of motion, Decreased strength, Hypomobility, Impaired UE functional use, Decreased balance, Difficulty walking  Visit Diagnosis: Left shoulder  pain, unspecified chronicity  Pain in thoracic spine  Other symptoms and signs involving the musculoskeletal system  Stiffness of left shoulder, not elsewhere classified  Muscle weakness (generalized)     Problem List Patient Active Problem List   Diagnosis Date Noted  . Acute colitis 12/02/2016  . Uncontrolled hypertension 12/02/2016  . Enteritis 12/02/2016  . Hyponatremia 12/02/2016  . Status post total shoulder arthroplasty, left 11/21/2016  . COPD (chronic obstructive pulmonary disease) ([place]) 11/21/2016  . Weakness 11/19/2016  . DJD of left shoulder 11/15/2016  . DOE (dyspnea on exertion) 11/11/2016  . Rotator cuff arthropathy of left shoulder 10/30/2016  . Intertrochanteric fracture of right hip ([place])  08/01/2015    Class: Acute  . SMA stenosis ([place]) 09/15/2014  . Abdominal pain, right upper quadrant 09/15/2014  . Essential hypertension 09/15/2014  . Depression 09/15/2014  . Hypothyroid 09/15/2014  . GERD without esophagitis 09/15/2014  . Lumbago 11/12/2013  . Stiffness of joint, not elsewhere classified, pelvic region and thigh 11/12/2013  . Muscle weakness (generalized) 11/12/2013  . Lack of coordination 03/21/2013  . Fine motor impairment 03/21/2013  . Left leg weakness 03/17/2013  . Risk for falls 03/17/2013  . Carotid artery disease ([place]) 03/11/2013  . Subclavian artery stenosis ([place]) 03/11/2013  . TIA (transient ischemic attack) 03/11/2013  . Tobacco use disorder 03/11/2013  . CVA (cerebral infarction) 03/05/2013  . Pain in joint, shoulder region 11/01/2010   Clarene Critchley PT, DPT 5:27 PM, 06/07/17 [place], [place], [address] Phone: [phone]   Fax:  [phone]  Name: [name] MRN: [id number] Date of Birth: 1935-04-05

## 2017-06-12 ENCOUNTER — Ambulatory Visit (HOSPITAL_COMMUNITY): Payer: Medicare HMO

## 2017-06-12 ENCOUNTER — Encounter (HOSPITAL_COMMUNITY): Payer: Self-pay

## 2017-06-12 DIAGNOSIS — M546 Pain in thoracic spine: Secondary | ICD-10-CM

## 2017-06-12 DIAGNOSIS — R29898 Other symptoms and signs involving the musculoskeletal system: Secondary | ICD-10-CM

## 2017-06-12 DIAGNOSIS — M25512 Pain in left shoulder: Secondary | ICD-10-CM | POA: Diagnosis not present

## 2017-06-12 DIAGNOSIS — M25612 Stiffness of left shoulder, not elsewhere classified: Secondary | ICD-10-CM

## 2017-06-12 DIAGNOSIS — M6281 Muscle weakness (generalized): Secondary | ICD-10-CM

## 2017-06-12 NOTE — Therapy (Signed)
[place], [place], [address] Phone: [phone]   Fax:  [phone]  Physical Therapy Treatment  Patient Details  Name: [name] MRN: [id number] Date of Birth: 02-14-1936 Referring Provider: Renette Butters, MD   Encounter Date: 06/12/2017  PT End of Session - 06/12/17 1612    Visit Number  3    Number of Visits  17    Date for PT Re-Evaluation  07/31/17 Mini-reassess 07/03/17    Authorization Type  [employer] (No auth required)    Authorization Time Period  06/05/17 - 07/31/17    Authorization - Visit Number  3    Authorization - Number of Visits  10    PT Start Time  3893    PT Stop Time  1645    PT Time Calculation (min)  39 min    Activity Tolerance  Patient tolerated treatment well    Behavior During Therapy  [facility] for tasks assessed/performed       Past Medical History:  Diagnosis Date  . Anxiety   . Arthritis   . Carotid artery disease ([place]) 2007   R CEA; dopplers 10/2016:  R ICA moderate (non-obstructive plaque) ; L ICA ~50-69%, but may underestimate. (reassess next with either  CTA versus formal cerebral angiogram)   . Constipation due to pain medication   . COPD (chronic obstructive pulmonary disease) ([place])   . Depression   . Diverticulosis   . Fibromyalgia   . Gastritis   . GERD (gastroesophageal reflux disease)   . Head injury   . History of pneumonia   . History of stroke 73/4287   complication of R SubClav A PTA -- R hemispheric CVA  . Hypertension   . Hypothyroidism   . Osteoarthritis   . Peripheral vascular disease ([place])    Bilateral subclavian artery disease (status post R SubClav A PTA).  R side CEA (with US findings of Mod-Severe L CIA stenosis).  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   . Urgency incontinence     Past Surgical History:  Procedure Laterality Date  . ANTERIOR AND POSTERIOR REPAIR  12/2000   Archie Endo 07/04/2010  . ANTERIOR  CERVICAL DECOMP/DISCECTOMY FUSION N/A 04/03/2012   Procedure: ANTERIOR CERVICAL DECOMPRESSION/DISCECTOMY FUSION 2 LEVELS;  Surgeon: Hosie Spangle, MD;  Location: [facility];  Service: Neurosurgery;  Laterality: N/A;  Cervical four-five,Cervical five-six anterior cervical decompression with fusion plating and bonegraft  . BACK SURGERY    . BREAST SURGERY Right 02/2007   Archie Endo 06/23/2010  . BUNIONECTOMY WITH HAMMERTOE RECONSTRUCTION Left 09/10/2007   Ar[facility] Endo 06/23/2010  . CAROTID ENDARTERECTOMY Right 2007   [facility], Dr. Lucky Cowboy  . CATARACT EXTRACTION W/ INTRAOCULAR LENS IMPLANT Left    Ar[facility] Endo 06/23/2010  . CHOLECYSTECTOMY    . COLONOSCOPY N/A 07/15/2014   Procedure: COLONOSCOPY;  Surgeon: Rogene Houston, MD;  [facility];  Service: Endoscopy;  Laterality: N/A;  200 - moved to 2:10 - Ann to notify pt  . COMPRESSION HIP SCREW Right 08/01/2015   Procedure: COMPRESSION HIP;  Surgeon: Carole Civil, MD;  Location: [facility];  Service: Orthopedics;  Laterality: Right;  . ESOPHAGOGASTRODUODENOSCOPY N/A 07/15/2014   Procedure: ESOPHAGOGASTRODUODENOSCOPY (EGD);  Surgeon: Rogene Houston, MD;  [facility];  Service: Endoscopy;  Laterality: N/A;  . EYE SURGERY     cataract /lens both eyes  . FRACTURE SURGERY    . INCONTINENCE SURGERY  12/2000  Tension-free transvaginal tape procedure/notes 07/04/2010  . JOINT REPLACEMENT    . LUMBAR FUSION  08/2006   Archie Endo 06/23/2010  . PERIPHERAL VASCULAR BALLOON ANGIOPLASTY  02/2013   Aortic arch angiography revealed 70% innominate/R subclavian stenosis along with 60% left common carotid ostial stenosis and moderate left subclavian artery stenosis. ->  Innominate artery PTA with 7 mm balloon reducing stenosis to roughly 30%.  Marland Kitchen PERIPHERAL VASCULAR CATHETERIZATION N/A 10/08/2014   Procedure: Visceral Angiography;  Surgeon: Algernon Huxley, MD;  Location: [name] CV [facility];  Service: Cardiovascular;  - long stenosis/occlusion of SMA. 75% celiac artery  (PTA with 6 mm balloon)  . PERIPHERAL VASCULAR CATHETERIZATION N/A 10/08/2014   Procedure: Visceral Artery Intervention;  Surgeon: Algernon Huxley, MD;  Location: [facility] CV [facility];  Service: Cardiovascular;: 6 mm PTA balloon to stenosed CELIAC ARTERY  . ROTATOR CUFF REPAIR Left X 3  . TOTAL KNEE ARTHROPLASTY Bilateral 2005; 2011   right; left  . TOTAL SHOULDER ARTHROPLASTY Left 11/15/2016  . TOTAL SHOULDER ARTHROPLASTY Left 11/15/2016   Procedure: LEFT TOTAL SHOULDER ARTHROPLASTY;  Surgeon: [name], MD;  Location: [facility];  Service: Orthopedics;  Laterality: Left;  . TRANSTHORACIC ECHOCARDIOGRAM  10/2016   Normal LV wall thickness with LVEF 60-65% and grade 1 diastolic dysfunction.  Aortic valve calcific sclerosis with no stenosis.  Mildly calcified mitral annulus and leaflets.   Marland Kitchen VAGINAL HYSTERECTOMY  12/2000   Ar[facility] Endo 07/04/2010    There were no vitals filed for this visit.  Subjective Assessment - 06/12/17 1626    Subjective  Pt stated she has been sore following exercise, no reports of pain today.      Patient Stated Goals  To get more motion in her left shoulder and to be able to walk longer    Currently in Pain?  No/denies                       [facility] Adult PT Treatment/Exercise - 06/12/17 0001      Posture/Postural Control   Posture Comments  Significant Thoracic kyphosis, rounded shoulders      Knee/Hip Exercises: Standing   Other Standing Knee Exercises  STS from normal chair height, cueing for eccentric control 20x      Shoulder Exercises: Supine   External Rotation  PROM;Left;10 reps    Internal Rotation  PROM;Left;10 reps    Flexion  PROM;Left;10 reps    ABduction  PROM;Left;10 reps    Other Supine Exercises  Supine bridges 15x 5"      Shoulder Exercises: Standing   Extension  Strengthening;AROM;Right;Left;20 reps;Other (comment)    Theraband Level (Shoulder Extension)  Level 2 (Red)    Row  Strengthening;Both;AROM;20 reps;Other (comment)     Theraband Level (Shoulder Row)  Level 2 (Red)    Retraction  Strengthening;AROM;Both;20 reps;Other (comment);Limitations    Theraband Level (Shoulder Retraction)  Level 2 (Red)    Retraction Limitations  tactile and verbal cueing for proper form      Shoulder Exercises: ROM/Strengthening   Other ROM/Strengthening Exercises  wall walk 5x each (Lt 11 on peg board and Rt 12 peg board      Shoulder Exercises: Stretch   Other Shoulder Stretches  Pec stretch against wall 5 x 20 seconds each upper extremity               PT Short Term Goals - 06/05/17 2128      PT SHORT TERM GOAL #1   Title  Patient will demonstrate understanding and report regular compliance with home exercise program to improve strength, range of motion, and decrease pain.     Time  4    Period  Weeks    Status  New    Target Date  07/03/17      PT SHORT TERM GOAL #2   Title  Patient will demonstrate improvement of 1/2 Grade MMT strength in all musculature tested as deficient at evaluation to assist patient with lifting activities, functional squatting, and other functional mobility.     Time  4    Period  Weeks    Status  New    Target Date  07/03/17      PT SHORT TERM GOAL #3   Title  Patient will report no greater than a 5/10 pain in her left shoulder or back over the course of a 1 week period indicating better tolerance with daily activities.     Time  4    Period  Weeks    Status  New    Target Date  07/03/17      PT SHORT TERM GOAL #4   Title  Patient will demonstrate improvement of 10 degrees in left shoulder active range of motion in all movements measured as deficient at evaluation to assist patient with lifting activities, and patient will be able to perform external rotation of left shoulder to touch base of neck and internal rotation of left shoulder to reach T5 for increased ease of adjusting clothing.     Time  4    Period  Weeks    Status  New    Target Date  07/03/17        PT Long  Term Goals - 06/05/17 2142      PT LONG TERM GOAL #1   Title  Patient will demonstrate improvement of 1 Grade MMT strength in all musculature tested as deficient at evaluation to assist patient with lifting activities, functional squatting, and other functional mobility.     Time  8    Period  Weeks    Status  New    Target Date  07/31/17      PT LONG TERM GOAL #2   Title  Patient will demonstrate improvement of 20 degrees in left shoulder active range of motion in all movements measured as deficient at evaluation to assist patient with lifting activities.     Time  8    Period  Weeks    Status  New    Target Date  07/31/17      PT LONG TERM GOAL #3   Title  Patient will perform the 5 times sit to stand test in 13 seconds or less indicating improved balance, decreased risk of falls, and overall improved functional mobility.     Time  8    Period  Weeks    Status  New    Target Date  07/31/17      PT LONG TERM GOAL #4   Title  Patient will perform lifting 1 box with 5 pound weight to shoulder level without increase of shoulder pain indicating improved ability to perform lifting in daily routine.     Time  8    Period  Weeks    Status  New    Target Date  07/31/17      PT LONG TERM GOAL #5   Title  Patient will report no greater than a 3/10 pain in either her left shoulder or  back over the course of a 1 week period indicating improved tolerance to daily activities.     Time  8    Period  Weeks    Status  New    Target Date  07/31/17            Plan - 06/12/17 1701    Clinical Impression Statement  Session focus on shoulder mobility and strengthening.  Reviewed compliance with HEP with ability to recall and demonstrate majority of exercises, min cueing for form with pec stretch and theraband exercsies.  Cueing to reduce compensation due to weakness and mobility.  Pt tolerated well with PROM to Lt shoulder without reports of increase pain through session.        Rehab  Potential  Fair    Clinical Impairments Affecting Rehab Potential  Positive: Patient's motivation; Negative: comorbidities, chronicity of pain    PT Frequency  2x / week    PT Duration  8 weeks    PT Treatment/Interventions  ADLs/Self Care Home Management;Aquatic Therapy;Cryotherapy;Electrical Stimulation;Moist Heat;DME Instruction;Gait training;Stair training;Functional mobility training;Therapeutic activities;Therapeutic exercise;Balance training;Neuromuscular re-education;Patient/family education;Manual techniques;Passive range of motion;Dry needling;Energy conservation;Taping    PT Next Visit Plan  Continue wiht ROM exercises for left shoulder; Lower extremity functional strengthening. Continue to progress with both shoulder, postural and lower extremity strengthening exercises.     PT Home Exercise Plan  06/07/17: Wall walk left upper extremity 1 x 15 5 second holds 1x/day; Pec stretch 5 x 20 seconds 2x/day each upper extremity; Shoulder extension/rows/scapular retraction with RTB 2 x 10 bilateral upper extremities 1x/day,        Patient will benefit from skilled therapeutic intervention in order to improve the following deficits and impairments:  Increased fascial restricitons, Improper body mechanics, Pain, Decreased mobility, Postural dysfunction, Decreased activity tolerance, Decreased endurance, Decreased range of motion, Decreased strength, Hypomobility, Impaired UE functional use, Decreased balance, Difficulty walking  Visit Diagnosis: Left shoulder pain, unspecified chronicity  Pain in thoracic spine  Other symptoms and signs involving the musculoskeletal system  Stiffness of left shoulder, not elsewhere classified  Muscle weakness (generalized)     Problem List Patient Active Problem List   Diagnosis Date Noted  . Acute colitis 12/02/2016  . Uncontrolled hypertension 12/02/2016  . Enteritis 12/02/2016  . Hyponatremia 12/02/2016  . Status post total shoulder arthroplasty,  left 11/21/2016  . COPD (chronic obstructive pulmonary disease) ([place]) 11/21/2016  . Weakness 11/19/2016  . DJD of left shoulder 11/15/2016  . DOE (dyspnea on exertion) 11/11/2016  . Rotator cuff arthropathy of left shoulder 10/30/2016  . Intertrochanteric fracture of right hip ([place]) 08/01/2015    Class: Acute  . SMA stenosis ([place]) 09/15/2014  . Abdominal pain, right upper quadrant 09/15/2014  . Essential hypertension 09/15/2014  . Depression 09/15/2014  . Hypothyroid 09/15/2014  . GERD without esophagitis 09/15/2014  . Lumbago 11/12/2013  . Stiffness of joint, not elsewhere classified, pelvic region and thigh 11/12/2013  . Muscle weakness (generalized) 11/12/2013  . Lack of coordination 03/21/2013  . Fine motor impairment 03/21/2013  . Left leg weakness 03/17/2013  . Risk for falls 03/17/2013  . Carotid artery disease ([place]) 03/11/2013  . Subclavian artery stenosis ([place]) 03/11/2013  . TIA (transient ischemic attack) 03/11/2013  . Tobacco use disorder 03/11/2013  . CVA (cerebral infarction) 03/05/2013  . Pain in joint, shoulder region 11/01/2010   Ihor Austin, [place]; [place]  Aldona Lento 06/12/2017, 5:38 PM  [place],  [place], [address] Phone: [phone]   Fax:  [phone]  Name: [name] MRN: [id number] Date of Birth: [birth year]

## 2017-06-14 ENCOUNTER — Encounter (HOSPITAL_COMMUNITY): Payer: Self-pay

## 2017-06-14 ENCOUNTER — Ambulatory Visit (HOSPITAL_COMMUNITY): Payer: Medicare HMO

## 2017-06-14 DIAGNOSIS — M546 Pain in thoracic spine: Secondary | ICD-10-CM

## 2017-06-14 DIAGNOSIS — M6281 Muscle weakness (generalized): Secondary | ICD-10-CM

## 2017-06-14 DIAGNOSIS — M25512 Pain in left shoulder: Secondary | ICD-10-CM

## 2017-06-14 DIAGNOSIS — R29898 Other symptoms and signs involving the musculoskeletal system: Secondary | ICD-10-CM | POA: Diagnosis not present

## 2017-06-14 DIAGNOSIS — M25612 Stiffness of left shoulder, not elsewhere classified: Secondary | ICD-10-CM | POA: Diagnosis not present

## 2017-06-14 NOTE — Telephone Encounter (Signed)
No show, called and spoke to pt who thought apt was scheduled for 4:00.  Found an opening for pt to come in at 4:00 later today.   Ihor Austin, [place]; CBIS [phone]

## 2017-06-14 NOTE — Therapy (Addendum)
[place], [place], [address] Phone: [phone]   Fax:  [phone]  Physical Therapy Treatment  Patient Details  Name: [name] MRN: [id number] Date of Birth: 1935-08-16 Referring Provider: Renette Butters, MD   Encounter Date: 06/14/2017  PT End of Session - 06/14/17 1612    Visit Number  4    Number of Visits  17    Date for PT Re-Evaluation  07/31/17 Mini-reassess 07/03/17    Authorization Type  [employer] (No auth required)    Authorization Time Period  06/05/17 - 07/31/17    Authorization - Visit Number  4    Authorization - Number of Visits  10    PT Start Time  7371    PT Stop Time  1645    PT Time Calculation (min)  38 min    Activity Tolerance  Patient tolerated treatment well    Behavior During Therapy  [facility] for tasks assessed/performed       Past Medical History:  Diagnosis Date  . Anxiety   . Arthritis   . Carotid artery disease ([place]) 2007   R CEA; dopplers 10/2016:  R ICA moderate (non-obstructive plaque) ; L ICA ~50-69%, but may underestimate. (reassess next with either  CTA versus formal cerebral angiogram)   . Constipation due to pain medication   . COPD (chronic obstructive pulmonary disease) ([place])   . Depression   . Diverticulosis   . Fibromyalgia   . Gastritis   . GERD (gastroesophageal reflux disease)   . Head injury   . History of pneumonia   . History of stroke 07/2692   complication of R SubClav A PTA -- R hemispheric CVA  . Hypertension   . Hypothyroidism   . Osteoarthritis   . Peripheral vascular disease ([place])    Bilateral subclavian artery disease (status post R SubClav A PTA).  R side CEA (with US findings of Mod-Severe L CIA stenosis).  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   . Urgency incontinence     Past Surgical History:  Procedure Laterality Date  . ANTERIOR AND POSTERIOR REPAIR  12/2000   Archie Endo 07/04/2010  . ANTERIOR  CERVICAL DECOMP/DISCECTOMY FUSION N/A 04/03/2012   Procedure: ANTERIOR CERVICAL DECOMPRESSION/DISCECTOMY FUSION 2 LEVELS;  Surgeon: Hosie Spangle, MD;  Location: [facility];  Service: Neurosurgery;  Laterality: N/A;  Cervical four-five,Cervical five-six anterior cervical decompression with fusion plating and bonegraft  . BACK SURGERY    . BREAST SURGERY Right 02/2007   Archie Endo 06/23/2010  . BUNIONECTOMY WITH HAMMERTOE RECONSTRUCTION Left 09/10/2007   Ar[facility] Endo 06/23/2010  . CAROTID ENDARTERECTOMY Right 2007   [facility], Dr. Lucky Cowboy  . CATARACT EXTRACTION W/ INTRAOCULAR LENS IMPLANT Left    [facility] Endo 06/23/2010  . CHOLECYSTECTOMY    . COLONOSCOPY N/A 07/15/2014   Procedure: COLONOSCOPY;  Surgeon: Rogene Houston, MD;  [facility];  Service: Endoscopy;  Laterality: N/A;  200 - moved to 2:10 - Ann to notify pt  . COMPRESSION HIP SCREW Right 08/01/2015   Procedure: COMPRESSION HIP;  Surgeon: Carole Civil, MD;  Location: [facility];  Service: Orthopedics;  Laterality: Right;  . ESOPHAGOGASTRODUODENOSCOPY N/A 07/15/2014   Procedure: ESOPHAGOGASTRODUODENOSCOPY (EGD);  Surgeon: Rogene Houston, MD;  [facility];  Service: Endoscopy;  Laterality: N/A;  . EYE SURGERY     cataract /lens both eyes  . FRACTURE SURGERY    . INCONTINENCE SURGERY  12/2000  Tension-free transvaginal tape procedure/notes 07/04/2010  . JOINT REPLACEMENT    . LUMBAR FUSION  08/2006   Archie Endo 06/23/2010  . PERIPHERAL VASCULAR BALLOON ANGIOPLASTY  02/2013   Aortic arch angiography revealed 70% innominate/R subclavian stenosis along with 60% left common carotid ostial stenosis and moderate left subclavian artery stenosis. ->  Innominate artery PTA with 7 mm balloon reducing stenosis to roughly 30%.  Marland Kitchen PERIPHERAL VASCULAR CATHETERIZATION N/A 10/08/2014   Procedure: Visceral Angiography;  Surgeon: Algernon Huxley, MD;  Location: [facility];  Service: Cardiovascular;  - long stenosis/occlusion of SMA. 75% celiac artery  (PTA with 6 mm balloon)  . PERIPHERAL VASCULAR CATHETERIZATION N/A 10/08/2014   Procedure: Visceral Artery Intervention;  Surgeon: Algernon Huxley, MD;  Location: [place] [facility];  Service: Cardiovascular;: 6 mm PTA balloon to stenosed CELIAC ARTERY  . ROTATOR CUFF REPAIR Left X 3  . TOTAL KNEE ARTHROPLASTY Bilateral 2005; 2011   right; left  . TOTAL SHOULDER ARTHROPLASTY Left 11/15/2016  . TOTAL SHOULDER ARTHROPLASTY Left 11/15/2016   Procedure: LEFT TOTAL SHOULDER ARTHROPLASTY;  Surgeon: [name], MD;  Location: [facility];  Service: Orthopedics;  Laterality: Left;  . TRANSTHORACIC ECHOCARDIOGRAM  10/2016   Normal LV wall thickness with LVEF 60-65% and grade 1 diastolic dysfunction.  Aortic valve calcific sclerosis with no stenosis.  Mildly calcified mitral annulus and leaflets.   Marland Kitchen VAGINAL HYSTERECTOMY  12/2000   Archie Endo 07/04/2010    There were no vitals filed for this visit.  Subjective Assessment - 06/14/17 1610    Subjective  Pt stated she is tired following grocery shopping today, stated Lt arm is achey today.    Patient Stated Goals  To get more motion in her left shoulder and to be able to walk longer    Currently in Pain?  Yes    Pain Score  4     Pain Location  Shoulder    Pain Orientation  Left    Pain Descriptors / Indicators  Aching;Dull    Pain Type  Chronic pain    Pain Onset  More than a month ago    Pain Frequency  Intermittent    Aggravating Factors   stretches    Pain Relieving Factors  rest, biofreeze                       [facility] Adult PT Treatment/Exercise - 06/14/17 0001      Shoulder Exercises: Supine   External Rotation  AROM;PROM;Left;10 reps AAROM 23 degrees    Internal Rotation  PROM;AROM;Left;10 reps    Flexion  PROM;AROM;Left;10 reps AAROM 123 degrees    ABduction  PROM;10 reps AAROM in seated 95 degrees    Other Supine Exercises  Supine bridges 15x 5"      Shoulder Exercises: Seated   Abduction  Both;10 reps cueing to reduce  compensation      Shoulder Exercises: Standing   Extension  Strengthening;Both;10 reps 2x 10 reps wiht RTB    Theraband Level (Shoulder Extension)  Level 2 (Red)    Row  Strengthening;Both;AROM;20 reps;Other (comment) 2x 10 reps wiht RTB    Theraband Level (Shoulder Row)  Level 2 (Red)    Other Standing Exercises  Wall walk 15x holding for 5 seconds left upper extremity      Shoulder Exercises: ROM/Strengthening   Wall Wash  2x 30" Bil UE for activty tolerance/strengthening      Manual Therapy   Manual Therapy  Passive  ROM    Manual therapy comments  Manual complete separate than rest of tx    Passive ROM  PROM Lt UE as tolerated all directions               PT Short Term Goals - 06/05/17 2128      PT SHORT TERM GOAL #1   Title  Patient will demonstrate understanding and report regular compliance with home exercise program to improve strength, range of motion, and decrease pain.     Time  4    Period  Weeks    Status  New    Target Date  07/03/17      PT SHORT TERM GOAL #2   Title  Patient will demonstrate improvement of 1/2 Grade MMT strength in all musculature tested as deficient at evaluation to assist patient with lifting activities, functional squatting, and other functional mobility.     Time  4    Period  Weeks    Status  New    Target Date  07/03/17      PT SHORT TERM GOAL #3   Title  Patient will report no greater than a 5/10 pain in her left shoulder or back over the course of a 1 week period indicating better tolerance with daily activities.     Time  4    Period  Weeks    Status  New    Target Date  07/03/17      PT SHORT TERM GOAL #4   Title  Patient will demonstrate improvement of 10 degrees in left shoulder active range of motion in all movements measured as deficient at evaluation to assist patient with lifting activities, and patient will be able to perform external rotation of left shoulder to touch base of neck and internal rotation of left  shoulder to reach T5 for increased ease of adjusting clothing.     Time  4    Period  Weeks    Status  New    Target Date  07/03/17        PT Long Term Goals - 06/05/17 2142      PT LONG TERM GOAL #1   Title  Patient will demonstrate improvement of 1 Grade MMT strength in all musculature tested as deficient at evaluation to assist patient with lifting activities, functional squatting, and other functional mobility.     Time  8    Period  Weeks    Status  New    Target Date  07/31/17      PT LONG TERM GOAL #2   Title  Patient will demonstrate improvement of 20 degrees in left shoulder active range of motion in all movements measured as deficient at evaluation to assist patient with lifting activities.     Time  8    Period  Weeks    Status  New    Target Date  07/31/17      PT LONG TERM GOAL #3   Title  Patient will perform the 5 times sit to stand test in 13 seconds or less indicating improved balance, decreased risk of falls, and overall improved functional mobility.     Time  8    Period  Weeks    Status  New    Target Date  07/31/17      PT LONG TERM GOAL #4   Title  Patient will perform lifting 1 box with 5 pound weight to shoulder level without increase of shoulder pain indicating  improved ability to perform lifting in daily routine.     Time  8    Period  Weeks    Status  New    Target Date  07/31/17      PT LONG TERM GOAL #5   Title  Patient will report no greater than a 3/10 pain in either her left shoulder or back over the course of a 1 week period indicating improved tolerance to daily activities.     Time  8    Period  Weeks    Status  New    Target Date  07/31/17            Plan - 06/14/17 1648    Clinical Impression Statement  Session focus with shoulder mobility and strengthening.  Pt improving shoulder mobility with improvements flexion, abd and ER though still lacking full range.  Continued with PROM and began AAROM for shoulder strengthening as  well as wall wash to improve activity tolerance and strengthening, did require cueing to reduce compensation with horizontal abduction movements.  No reports of increased pain through session.      Rehab Potential  Fair    Clinical Impairments Affecting Rehab Potential  Positive: Patient's motivation; Negative: comorbidities, chronicity of pain    PT Frequency  2x / week    PT Duration  8 weeks    PT Treatment/Interventions  ADLs/Self Care Home Management;Aquatic Therapy;Cryotherapy;Electrical Stimulation;Moist Heat;DME Instruction;Gait training;Stair training;Functional mobility training;Therapeutic activities;Therapeutic exercise;Balance training;Neuromuscular re-education;Patient/family education;Manual techniques;Passive range of motion;Dry needling;Energy conservation;Taping    PT Next Visit Plan  Continue wiht ROM exercises for left shoulder; Lower extremity functional strengthening. Continue to progress with both shoulder, postural and lower extremity strengthening exercises.     PT Home Exercise Plan  06/07/17: Wall walk left upper extremity 1 x 15 5 second holds 1x/day; Pec stretch 5 x 20 seconds 2x/day each upper extremity; Shoulder extension/rows/scapular retraction with RTB 2 x 10 bilateral upper extremities 1x/day,        Patient will benefit from skilled therapeutic intervention in order to improve the following deficits and impairments:  Increased fascial restricitons, Improper body mechanics, Pain, Decreased mobility, Postural dysfunction, Decreased activity tolerance, Decreased endurance, Decreased range of motion, Decreased strength, Hypomobility, Impaired UE functional use, Decreased balance, Difficulty walking  Visit Diagnosis: Left shoulder pain, unspecified chronicity  Pain in thoracic spine  Other symptoms and signs involving the musculoskeletal system  Stiffness of left shoulder, not elsewhere classified  Muscle weakness (generalized)     Problem List Patient Active  Problem List   Diagnosis Date Noted  . Acute colitis 12/02/2016  . Uncontrolled hypertension 12/02/2016  . Enteritis 12/02/2016  . Hyponatremia 12/02/2016  . Status post total shoulder arthroplasty, left 11/21/2016  . COPD (chronic obstructive pulmonary disease) ([place]) 11/21/2016  . Weakness 11/19/2016  . DJD of left shoulder 11/15/2016  . DOE (dyspnea on exertion) 11/11/2016  . Rotator cuff arthropathy of left shoulder 10/30/2016  . Intertrochanteric fracture of right hip ([place]) 08/01/2015    Class: Acute  . SMA stenosis ([place]) 09/15/2014  . Abdominal pain, right upper quadrant 09/15/2014  . Essential hypertension 09/15/2014  . Depression 09/15/2014  . Hypothyroid 09/15/2014  . GERD without esophagitis 09/15/2014  . Lumbago 11/12/2013  . Stiffness of joint, not elsewhere classified, pelvic region and thigh 11/12/2013  . Muscle weakness (generalized) 11/12/2013  . Lack of coordination 03/21/2013  . Fine motor impairment 03/21/2013  . Left leg weakness 03/17/2013  . Risk for falls 03/17/2013  .  Carotid artery disease ([place]) 03/11/2013  . Subclavian artery stenosis ([place]) 03/11/2013  . TIA (transient ischemic attack) 03/11/2013  . Tobacco use disorder 03/11/2013  . CVA (cerebral infarction) 03/05/2013  . Pain in joint, shoulder region 11/01/2010   Ihor Austin, [place]; [place]  Aldona Lento 06/14/2017, [place], [place], [address] Phone: [phone]   Fax:  [phone]  Name: [name] MRN: [id number] Date of Birth: 1935-11-25

## 2017-06-19 ENCOUNTER — Encounter (HOSPITAL_COMMUNITY): Payer: Self-pay

## 2017-06-19 ENCOUNTER — Ambulatory Visit (HOSPITAL_COMMUNITY): Payer: Medicare HMO

## 2017-06-19 DIAGNOSIS — M25512 Pain in left shoulder: Secondary | ICD-10-CM | POA: Diagnosis not present

## 2017-06-19 DIAGNOSIS — M25612 Stiffness of left shoulder, not elsewhere classified: Secondary | ICD-10-CM

## 2017-06-19 DIAGNOSIS — M546 Pain in thoracic spine: Secondary | ICD-10-CM | POA: Diagnosis not present

## 2017-06-19 DIAGNOSIS — M6281 Muscle weakness (generalized): Secondary | ICD-10-CM | POA: Diagnosis not present

## 2017-06-19 DIAGNOSIS — R29898 Other symptoms and signs involving the musculoskeletal system: Secondary | ICD-10-CM | POA: Diagnosis not present

## 2017-06-19 NOTE — Therapy (Signed)
[place], [place], [address] Phone: [phone]   Fax:  [phone]  Physical Therapy Treatment  Patient Details  Name: [name] MRN: [id number] Date of Birth: Jul 06, 1935 Referring Provider: Renette Butters, MD   Encounter Date: 06/19/2017  PT End of Session - 06/19/17 1353    Visit Number  5    Number of Visits  17    Date for PT Re-Evaluation  07/31/17 Mini-reassess 07/03/17    Authorization Type  [employer] (No auth required)    Authorization Time Period  06/05/17 - 07/31/17    Authorization - Visit Number  5    Authorization - Number of Visits  10    PT Start Time  8786    PT Stop Time  1432    PT Time Calculation (min)  44 min    Activity Tolerance  Patient tolerated treatment well    Behavior During Therapy  [facility] for tasks assessed/performed       Past Medical History:  Diagnosis Date  . Anxiety   . Arthritis   . Carotid artery disease ([place]) 2007   R CEA; dopplers 10/2016:  R ICA moderate (non-obstructive plaque) ; L ICA ~50-69%, but may underestimate. (reassess next with either  CTA versus formal cerebral angiogram)   . Constipation due to pain medication   . COPD (chronic obstructive pulmonary disease) ([place])   . Depression   . Diverticulosis   . Fibromyalgia   . Gastritis   . GERD (gastroesophageal reflux disease)   . Head injury   . History of pneumonia   . History of stroke 76/7209   complication of R SubClav A PTA -- R hemispheric CVA  . Hypertension   . Hypothyroidism   . Osteoarthritis   . Peripheral vascular disease ([place])    Bilateral subclavian artery disease (status post R SubClav A PTA).  R side CEA (with US findings of Mod-Severe L CIA stenosis).  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   . Urgency incontinence     Past Surgical History:  Procedure Laterality Date  . ANTERIOR AND POSTERIOR REPAIR  12/2000   Archie Endo 07/04/2010  . ANTERIOR  CERVICAL DECOMP/DISCECTOMY FUSION N/A 04/03/2012   Procedure: ANTERIOR CERVICAL DECOMPRESSION/DISCECTOMY FUSION 2 LEVELS;  Surgeon: Hosie Spangle, MD;  Location: [facility];  Service: Neurosurgery;  Laterality: N/A;  Cervical four-five,Cervical five-six anterior cervical decompression with fusion plating and bonegraft  . BACK SURGERY    . BREAST SURGERY Right 02/2007   Archie Endo 06/23/2010  . BUNIONECTOMY WITH HAMMERTOE RECONSTRUCTION Left 09/10/2007   Archie Endo 06/23/2010  . CAROTID ENDARTERECTOMY Right 2007   [facility], Dr. Lucky Cowboy  . CATARACT EXTRACTION W/ INTRAOCULAR LENS IMPLANT Left    Archie Endo 06/23/2010  . CHOLECYSTECTOMY    . COLONOSCOPY N/A 07/15/2014   Procedure: COLONOSCOPY;  Surgeon: Rogene Houston, MD;  [facility];  Service: Endoscopy;  Laterality: N/A;  200 - moved to 2:10 - Ann to notify pt  . COMPRESSION HIP SCREW Right 08/01/2015   Procedure: COMPRESSION HIP;  Surgeon: Carole Civil, MD;  Location: [facility];  Service: Orthopedics;  Laterality: Right;  . ESOPHAGOGASTRODUODENOSCOPY N/A 07/15/2014   Procedure: ESOPHAGOGASTRODUODENOSCOPY (EGD);  Surgeon: Rogene Houston, MD;  [facility];  Service: Endoscopy;  Laterality: N/A;  . EYE SURGERY     cataract /lens both eyes  . FRACTURE SURGERY    . INCONTINENCE SURGERY  12/2000  Tension-free transvaginal tape procedure/notes 07/04/2010  . JOINT REPLACEMENT    . LUMBAR FUSION  08/2006   Archie Endo 06/23/2010  . PERIPHERAL VASCULAR BALLOON ANGIOPLASTY  02/2013   Aortic arch angiography revealed 70% innominate/R subclavian stenosis along with 60% left common carotid ostial stenosis and moderate left subclavian artery stenosis. ->  Innominate artery PTA with 7 mm balloon reducing stenosis to roughly 30%.  Marland Kitchen PERIPHERAL VASCULAR CATHETERIZATION N/A 10/08/2014   Procedure: Visceral Angiography;  Surgeon: Algernon Huxley, MD;  Location: [place] [facility];  Service: Cardiovascular;  - long stenosis/occlusion of SMA. 75% celiac artery  (PTA with 6 mm balloon)  . PERIPHERAL VASCULAR CATHETERIZATION N/A 10/08/2014   Procedure: Visceral Artery Intervention;  Surgeon: Algernon Huxley, MD;  Location: [place] [facility];  Service: Cardiovascular;: 6 mm PTA balloon to stenosed CELIAC ARTERY  . ROTATOR CUFF REPAIR Left X 3  . TOTAL KNEE ARTHROPLASTY Bilateral 2005; 2011   right; left  . TOTAL SHOULDER ARTHROPLASTY Left 11/15/2016  . TOTAL SHOULDER ARTHROPLASTY Left 11/15/2016   Procedure: LEFT TOTAL SHOULDER ARTHROPLASTY;  Surgeon: [name], MD;  Location: [facility];  Service: Orthopedics;  Laterality: Left;  . TRANSTHORACIC ECHOCARDIOGRAM  10/2016   Normal LV wall thickness with LVEF 60-65% and grade 1 diastolic dysfunction.  Aortic valve calcific sclerosis with no stenosis.  Mildly calcified mitral annulus and leaflets.   Marland Kitchen VAGINAL HYSTERECTOMY  12/2000   Archie Endo 07/04/2010    There were no vitals filed for this visit.  Subjective Assessment - 06/19/17 1352    Subjective  Pt stated she window washed Saturday and Monday with increased soreness Lt arm, current pain scale 2-3/10    Patient Stated Goals  To get more motion in her left shoulder and to be able to walk longer    Currently in Pain?  Yes    Pain Score  2     Pain Location  Arm    Pain Orientation  Left    Pain Descriptors / Indicators  Sore    Pain Type  Chronic pain    Pain Onset  More than a month ago    Pain Frequency  Intermittent    Aggravating Factors   stretches    Pain Relieving Factors  rest, biofreeze                       [facility] Adult PT Treatment/Exercise - 06/19/17 0001      Shoulder Exercises: Supine   External Rotation  PROM;Left;10 reps;AAROM AAROm with dowel rod     Internal Rotation  PROM;Left;10 reps;AAROM AAROm with dowel rod     Flexion  PROM;Left;10 reps;AAROM AAROm with dowel rod     ABduction  PROM;10 reps;AAROM;Other (comment) presents with scaption AAROm with dowel rod       Shoulder Exercises: Seated   External  Rotation  Both;10 reps;Limitations    External Rotation Limitations  theraball flexion, abduction, IR/ER    Internal Rotation  10 reps;Limitations    Internal Rotation Limitations  theraball flexion, abduction, IR/ER    Flexion  Both;Limitations    Flexion Limitations  theraball flexion, abduction, IR/ER    Abduction  Both;10 reps;Limitations    ABduction Limitations  theraball flexion, abduction, IR/ER      Shoulder Exercises: Standing   Extension  Strengthening;Both;10 reps;Other (comment) 2 sets    Theraband Level (Shoulder Extension)  Level 2 (Red)    Row  Strengthening;Both;AROM;20 reps;Other (comment) 2 sets  Theraband Level (Shoulder Row)  Level 2 (Red)      Shoulder Exercises: ROM/Strengthening   Other ROM/Strengthening Exercises  wall walk 5x each (Lt 11 on peg board and Rt 12 peg board               PT Short Term Goals - 06/05/17 2128      PT SHORT TERM GOAL #1   Title  Patient will demonstrate understanding and report regular compliance with home exercise program to improve strength, range of motion, and decrease pain.     Time  4    Period  Weeks    Status  New    Target Date  07/03/17      PT SHORT TERM GOAL #2   Title  Patient will demonstrate improvement of 1/2 Grade MMT strength in all musculature tested as deficient at evaluation to assist patient with lifting activities, functional squatting, and other functional mobility.     Time  4    Period  Weeks    Status  New    Target Date  07/03/17      PT SHORT TERM GOAL #3   Title  Patient will report no greater than a 5/10 pain in her left shoulder or back over the course of a 1 week period indicating better tolerance with daily activities.     Time  4    Period  Weeks    Status  New    Target Date  07/03/17      PT SHORT TERM GOAL #4   Title  Patient will demonstrate improvement of 10 degrees in left shoulder active range of motion in all movements measured as deficient at evaluation to assist  patient with lifting activities, and patient will be able to perform external rotation of left shoulder to touch base of neck and internal rotation of left shoulder to reach T5 for increased ease of adjusting clothing.     Time  4    Period  Weeks    Status  New    Target Date  07/03/17        PT Long Term Goals - 06/05/17 2142      PT LONG TERM GOAL #1   Title  Patient will demonstrate improvement of 1 Grade MMT strength in all musculature tested as deficient at evaluation to assist patient with lifting activities, functional squatting, and other functional mobility.     Time  8    Period  Weeks    Status  New    Target Date  07/31/17      PT LONG TERM GOAL #2   Title  Patient will demonstrate improvement of 20 degrees in left shoulder active range of motion in all movements measured as deficient at evaluation to assist patient with lifting activities.     Time  8    Period  Weeks    Status  New    Target Date  07/31/17      PT LONG TERM GOAL #3   Title  Patient will perform the 5 times sit to stand test in 13 seconds or less indicating improved balance, decreased risk of falls, and overall improved functional mobility.     Time  8    Period  Weeks    Status  New    Target Date  07/31/17      PT LONG TERM GOAL #4   Title  Patient will perform lifting 1 box with 5 pound weight to  shoulder level without increase of shoulder pain indicating improved ability to perform lifting in daily routine.     Time  8    Period  Weeks    Status  New    Target Date  07/31/17      PT LONG TERM GOAL #5   Title  Patient will report no greater than a 3/10 pain in either her left shoulder or back over the course of a 1 week period indicating improved tolerance to daily activities.     Time  8    Period  Weeks    Status  New    Target Date  07/31/17            Plan - 06/19/17 1407    Clinical Impression Statement  Continued session focus with shoulder mobility and strengthening.   Added theraball and dowel rod to improve AAROM within pain free range (unable to complete full body IR/ER) due to shoulder mobility.  Cueing to reduce compensation with horizontal abduction and scapulation movements, pt able to self correct with wall walking.  No reports of increased pain through session.      Rehab Potential  Fair    Clinical Impairments Affecting Rehab Potential  Positive: Patient's motivation; Negative: comorbidities, chronicity of pain    PT Frequency  2x / week    PT Duration  8 weeks    PT Treatment/Interventions  ADLs/Self Care Home Management;Aquatic Therapy;Cryotherapy;Electrical Stimulation;Moist Heat;DME Instruction;Gait training;Stair training;Functional mobility training;Therapeutic activities;Therapeutic exercise;Balance training;Neuromuscular re-education;Patient/family education;Manual techniques;Passive range of motion;Dry needling;Energy conservation;Taping    PT Next Visit Plan  Continue wiht ROM exercises for left shoulder; Lower extremity functional strengthening. Continue to progress with both shoulder, postural and lower extremity strengthening exercises.     PT Home Exercise Plan  06/07/17: Wall walk left upper extremity 1 x 15 5 second holds 1x/day; Pec stretch 5 x 20 seconds 2x/day each upper extremity; Shoulder extension/rows/scapular retraction with RTB 2 x 10 bilateral upper extremities 1x/day,        Patient will benefit from skilled therapeutic intervention in order to improve the following deficits and impairments:  Increased fascial restricitons, Improper body mechanics, Pain, Decreased mobility, Postural dysfunction, Decreased activity tolerance, Decreased endurance, Decreased range of motion, Decreased strength, Hypomobility, Impaired UE functional use, Decreased balance, Difficulty walking  Visit Diagnosis: Left shoulder pain, unspecified chronicity  Pain in thoracic spine  Other symptoms and signs involving the musculoskeletal system  Stiffness  of left shoulder, not elsewhere classified  Muscle weakness (generalized)     Problem List Patient Active Problem List   Diagnosis Date Noted  . Acute colitis 12/02/2016  . Uncontrolled hypertension 12/02/2016  . Enteritis 12/02/2016  . Hyponatremia 12/02/2016  . Status post total shoulder arthroplasty, left 11/21/2016  . COPD (chronic obstructive pulmonary disease) ([place]) 11/21/2016  . Weakness 11/19/2016  . DJD of left shoulder 11/15/2016  . DOE (dyspnea on exertion) 11/11/2016  . Rotator cuff arthropathy of left shoulder 10/30/2016  . Intertrochanteric fracture of right hip ([place]) 08/01/2015    Class: Acute  . SMA stenosis ([place]) 09/15/2014  . Abdominal pain, right upper quadrant 09/15/2014  . Essential hypertension 09/15/2014  . Depression 09/15/2014  . Hypothyroid 09/15/2014  . GERD without esophagitis 09/15/2014  . Lumbago 11/12/2013  . Stiffness of joint, not elsewhere classified, pelvic region and thigh 11/12/2013  . Muscle weakness (generalized) 11/12/2013  . Lack of coordination 03/21/2013  . Fine motor impairment 03/21/2013  . Left leg weakness 03/17/2013  . Risk for  falls 03/17/2013  . Carotid artery disease ([place]) 03/11/2013  . Subclavian artery stenosis ([place]) 03/11/2013  . TIA (transient ischemic attack) 03/11/2013  . Tobacco use disorder 03/11/2013  . CVA (cerebral infarction) 03/05/2013  . Pain in joint, shoulder region 11/01/2010   Ihor Austin, [place]; [place]  Aldona Lento 06/19/2017, 3:35 PM  [place], [place], [address] Phone: [phone]   Fax:  [phone]  Name: [name] MRN: [id number] Date of Birth: [birth year]

## 2017-06-21 ENCOUNTER — Ambulatory Visit (HOSPITAL_COMMUNITY): Payer: PPO | Attending: Orthopedic Surgery

## 2017-06-21 ENCOUNTER — Encounter (HOSPITAL_COMMUNITY): Payer: Self-pay

## 2017-06-21 DIAGNOSIS — R29898 Other symptoms and signs involving the musculoskeletal system: Secondary | ICD-10-CM | POA: Insufficient documentation

## 2017-06-21 DIAGNOSIS — M25512 Pain in left shoulder: Secondary | ICD-10-CM | POA: Insufficient documentation

## 2017-06-21 DIAGNOSIS — M6281 Muscle weakness (generalized): Secondary | ICD-10-CM | POA: Insufficient documentation

## 2017-06-21 DIAGNOSIS — M546 Pain in thoracic spine: Secondary | ICD-10-CM | POA: Diagnosis not present

## 2017-06-21 DIAGNOSIS — M25612 Stiffness of left shoulder, not elsewhere classified: Secondary | ICD-10-CM | POA: Insufficient documentation

## 2017-06-21 NOTE — Therapy (Signed)
[place], [place], [address] Phone: [phone]   Fax:  [phone]  Physical Therapy Treatment  Patient Details  Name: [name] MRN: [id number] Date of Birth: 03/07/1935 Referring Provider: Renette Butters, MD   Encounter Date: 06/21/2017  PT End of Session - 06/21/17 1608    Visit Number  6    Number of Visits  17    Date for PT Re-Evaluation  07/31/17 Mini-reassess 07/03/17    Authorization Type  [employer] (No auth required)    Authorization Time Period  06/05/17 - 07/31/17    Authorization - Visit Number  6    Authorization - Number of Visits  10    PT Start Time  1660    PT Stop Time  6301    PT Time Calculation (min)  46 min    Activity Tolerance  Patient tolerated treatment well;Patient limited by pain;No increased pain    Behavior During Therapy  WFL for tasks assessed/performed       Past Medical History:  Diagnosis Date  . Anxiety   . Arthritis   . Carotid artery disease ([place]) 2007   R CEA; dopplers 10/2016:  R ICA moderate (non-obstructive plaque) ; L ICA ~50-69%, but may underestimate. (reassess next with either  CTA versus formal cerebral angiogram)   . Constipation due to pain medication   . COPD (chronic obstructive pulmonary disease) ([place])   . Depression   . Diverticulosis   . Fibromyalgia   . Gastritis   . GERD (gastroesophageal reflux disease)   . Head injury   . History of pneumonia   . History of stroke 60/1093   complication of R SubClav A PTA -- R hemispheric CVA  . Hypertension   . Hypothyroidism   . Osteoarthritis   . Peripheral vascular disease ([place])    Bilateral subclavian artery disease (status post R SubClav A PTA).  R side CEA (with US findings of Mod-Severe L CIA stenosis).  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   . Urgency incontinence     Past Surgical History:  Procedure Laterality Date  . ANTERIOR AND POSTERIOR REPAIR  12/2000    Archie Endo 07/04/2010  . ANTERIOR CERVICAL DECOMP/DISCECTOMY FUSION N/A 04/03/2012   Procedure: ANTERIOR CERVICAL DECOMPRESSION/DISCECTOMY FUSION 2 LEVELS;  Surgeon: Hosie Spangle, MD;  Location: [facility];  Service: Neurosurgery;  Laterality: N/A;  Cervical four-five,Cervical five-six anterior cervical decompression with fusion plating and bonegraft  . BACK SURGERY    . BREAST SURGERY Right 02/2007   Archie Endo 06/23/2010  . BUNIONECTOMY WITH HAMMERTOE RECONSTRUCTION Left 09/10/2007   Ar[facility] Endo 06/23/2010  . CAROTID ENDARTERECTOMY Right 2007   [facility], Dr. Lucky Cowboy  . CATARACT EXTRACTION W/ INTRAOCULAR LENS IMPLANT Left    [facility] Endo 06/23/2010  . CHOLECYSTECTOMY    . COLONOSCOPY N/A 07/15/2014   Procedure: COLONOSCOPY;  Surgeon: Rogene Houston, MD;  [facility];  Service: Endoscopy;  Laterality: N/A;  200 - moved to 2:10 - Ann to notify pt  . COMPRESSION HIP SCREW Right 08/01/2015   Procedure: COMPRESSION HIP;  Surgeon: Carole Civil, MD;  Location: [facility];  Service: Orthopedics;  Laterality: Right;  . ESOPHAGOGASTRODUODENOSCOPY N/A 07/15/2014   Procedure: ESOPHAGOGASTRODUODENOSCOPY (EGD);  Surgeon: Rogene Houston, MD;  [facility];  Service: Endoscopy;  Laterality: N/A;  . EYE SURGERY     cataract /lens both eyes  . FRACTURE SURGERY    .  INCONTINENCE SURGERY  12/2000   Tension-free transvaginal tape procedure/notes 07/04/2010  . JOINT REPLACEMENT    . LUMBAR FUSION  08/2006   Archie Endo 06/23/2010  . PERIPHERAL VASCULAR BALLOON ANGIOPLASTY  02/2013   Aortic arch angiography revealed 70% innominate/R subclavian stenosis along with 60% left common carotid ostial stenosis and moderate left subclavian artery stenosis. ->  Innominate artery PTA with 7 mm balloon reducing stenosis to roughly 30%.  Marland Kitchen PERIPHERAL VASCULAR CATHETERIZATION N/A 10/08/2014   Procedure: Visceral Angiography;  Surgeon: Algernon Huxley, MD;  Location: [facility];  Service: Cardiovascular;  - long  stenosis/occlusion of SMA. 75% celiac artery (PTA with 6 mm balloon)  . PERIPHERAL VASCULAR CATHETERIZATION N/A 10/08/2014   Procedure: Visceral Artery Intervention;  Surgeon: Algernon Huxley, MD;  Location: [facility];  Service: Cardiovascular;: 6 mm PTA balloon to stenosed CELIAC ARTERY  . ROTATOR CUFF REPAIR Left X 3  . TOTAL KNEE ARTHROPLASTY Bilateral 2005; 2011   right; left  . TOTAL SHOULDER ARTHROPLASTY Left 11/15/2016  . TOTAL SHOULDER ARTHROPLASTY Left 11/15/2016   Procedure: LEFT TOTAL SHOULDER ARTHROPLASTY;  Surgeon: [name], MD;  Location: [facility];  Service: Orthopedics;  Laterality: Left;  . TRANSTHORACIC ECHOCARDIOGRAM  10/2016   Normal LV wall thickness with LVEF 60-65% and grade 1 diastolic dysfunction.  Aortic valve calcific sclerosis with no stenosis.  Mildly calcified mitral annulus and leaflets.   Marland Kitchen VAGINAL HYSTERECTOMY  12/2000   Archie Endo 07/04/2010    There were no vitals filed for this visit.  Subjective Assessment - 06/21/17 1606    Subjective  Pt stated she swept yesterday and mopped today with increased LBP achey pain scale 7/10, Lt arm pain scale 5/10.    Patient Stated Goals  To get more motion in her left shoulder and to be able to walk longer    Currently in Pain?  Yes    Pain Score  5     Pain Location  Arm    Pain Orientation  Left    Pain Descriptors / Indicators  Aching    Pain Type  Chronic pain    Pain Onset  More than a month ago    Pain Frequency  Intermittent    Aggravating Factors   stretches    Pain Relieving Factors  rest, biofreeze    Multiple Pain Sites  Yes    Pain Score  7    Pain Location  Back    Pain Orientation  Lower    Pain Descriptors / Indicators  Aching    Pain Type  Chronic pain    Pain Onset  More than a month ago    Pain Frequency  Intermittent    Aggravating Factors   sweeping/ mopping, walking    Pain Relieving Factors  rest    Effect of Pain on Daily Activities  limits                        [facility] Adult PT Treatment/Exercise - 06/21/17 0001      Knee/Hip Exercises: Standing   Other Standing Knee Exercises  STS from normal chair height, cueing for eccentric control 20x      Shoulder Exercises: Supine   External Rotation  PROM;Left;10 reps;AAROM    Internal Rotation  PROM;Left;10 reps;AAROM    Flexion  PROM;Left;10 reps;AAROM    ABduction  PROM;10 reps;AAROM;Other (comment)    Other Supine Exercises  Supine bridges 15x 5"  Other Supine Exercises  Decompression exercises 1-5 x 5 reps x 5"; deep breathing and abdominal sets 5x 5" (verbal and tactile cueing)      Shoulder Exercises: Seated   External Rotation  Both;10 reps;Limitations    External Rotation Limitations  theraball flexion, abduction, IR/ER    Internal Rotation  10 reps;Limitations    Internal Rotation Limitations  theraball flexion, abduction, IR/ER    Flexion  Both;Limitations    Flexion Limitations  theraball flexion, abduction, IR/ER    Abduction  Both;10 reps;Limitations    ABduction Limitations  theraball flexion, abduction, IR/ER    Other Seated Exercises  table slides for abd and flexion      Shoulder Exercises: Standing   Extension  Strengthening;Both;10 reps;Other (comment) 2 sets    Theraband Level (Shoulder Extension)  Level 2 (Red)    Row  Strengthening;Both;AROM;20 reps;Other (comment)    Theraband Level (Shoulder Row)  Level 2 (Red) 2x 10    Other Standing Exercises  lumbar extension 5x 5"       Shoulder Exercises: ROM/Strengthening   Other ROM/Strengthening Exercises  wall walk 5x each (Lt 11 on peg board and Rt 12 peg board               PT Short Term Goals - 06/05/17 2128      PT SHORT TERM GOAL #1   Title  Patient will demonstrate understanding and report regular compliance with home exercise program to improve strength, range of motion, and decrease pain.     Time  4    Period  Weeks    Status  New    Target Date  07/03/17      PT  SHORT TERM GOAL #2   Title  Patient will demonstrate improvement of 1/2 Grade MMT strength in all musculature tested as deficient at evaluation to assist patient with lifting activities, functional squatting, and other functional mobility.     Time  4    Period  Weeks    Status  New    Target Date  07/03/17      PT SHORT TERM GOAL #3   Title  Patient will report no greater than a 5/10 pain in her left shoulder or back over the course of a 1 week period indicating better tolerance with daily activities.     Time  4    Period  Weeks    Status  New    Target Date  07/03/17      PT SHORT TERM GOAL #4   Title  Patient will demonstrate improvement of 10 degrees in left shoulder active range of motion in all movements measured as deficient at evaluation to assist patient with lifting activities, and patient will be able to perform external rotation of left shoulder to touch base of neck and internal rotation of left shoulder to reach T5 for increased ease of adjusting clothing.     Time  4    Period  Weeks    Status  New    Target Date  07/03/17        PT Long Term Goals - 06/05/17 2142      PT LONG TERM GOAL #1   Title  Patient will demonstrate improvement of 1 Grade MMT strength in all musculature tested as deficient at evaluation to assist patient with lifting activities, functional squatting, and other functional mobility.     Time  8    Period  Weeks    Status  New  Target Date  07/31/17      PT LONG TERM GOAL #2   Title  Patient will demonstrate improvement of 20 degrees in left shoulder active range of motion in all movements measured as deficient at evaluation to assist patient with lifting activities.     Time  8    Period  Weeks    Status  New    Target Date  07/31/17      PT LONG TERM GOAL #3   Title  Patient will perform the 5 times sit to stand test in 13 seconds or less indicating improved balance, decreased risk of falls, and overall improved functional mobility.      Time  8    Period  Weeks    Status  New    Target Date  07/31/17      PT LONG TERM GOAL #4   Title  Patient will perform lifting 1 box with 5 pound weight to shoulder level without increase of shoulder pain indicating improved ability to perform lifting in daily routine.     Time  8    Period  Weeks    Status  New    Target Date  07/31/17      PT LONG TERM GOAL #5   Title  Patient will report no greater than a 3/10 pain in either her left shoulder or back over the course of a 1 week period indicating improved tolerance to daily activities.     Time  8    Period  Weeks    Status  New    Target Date  07/31/17            Plan - 06/21/17 1638    Clinical Impression Statement  Continued session focus iwht shoulder mobility, postural strengthening and addressed some core/proximal strengthening to assist with LBP today.  Added table slides and contined with theraball and PROM to improve shoulder mobility.  Pt continues to require cueing to reduce compensation and improve awareness with posture with standing activities.  Added decompression exercises to improve posture and address LBP.  Pt making good progress with improved shoulder mobility and reports of decreased LBP and shoulder pain at EOS.    Rehab Potential  Fair    Clinical Impairments Affecting Rehab Potential  Positive: Patient's motivation; Negative: comorbidities, chronicity of pain    PT Frequency  2x / week    PT Duration  8 weeks    PT Treatment/Interventions  ADLs/Self Care Home Management;Aquatic Therapy;Cryotherapy;Electrical Stimulation;Moist Heat;DME Instruction;Gait training;Stair training;Functional mobility training;Therapeutic activities;Therapeutic exercise;Balance training;Neuromuscular re-education;Patient/family education;Manual techniques;Passive range of motion;Dry needling;Energy conservation;Taping    PT Next Visit Plan  Continue with PROM and begin more AAROM for left shoulder; Lower extremity functional  strengthening. Continue to progress with both shoulder, postural and lower extremity strengthening exercises.     PT Home Exercise Plan  06/07/17: Wall walk left upper extremity 1 x 15 5 second holds 1x/day; Pec stretch 5 x 20 seconds 2x/day each upper extremity; Shoulder extension/rows/scapular retraction with RTB 2 x 10 bilateral upper extremities 1x/day, 06/21/17; decompression exercise       Patient will benefit from skilled therapeutic intervention in order to improve the following deficits and impairments:  Increased fascial restricitons, Improper body mechanics, Pain, Decreased mobility, Postural dysfunction, Decreased activity tolerance, Decreased endurance, Decreased range of motion, Decreased strength, Hypomobility, Impaired UE functional use, Decreased balance, Difficulty walking  Visit Diagnosis: Left shoulder pain, unspecified chronicity  Pain in thoracic spine  Other symptoms and  signs involving the musculoskeletal system  Stiffness of left shoulder, not elsewhere classified  Muscle weakness (generalized)     Problem List Patient Active Problem List   Diagnosis Date Noted  . Acute colitis 12/02/2016  . Uncontrolled hypertension 12/02/2016  . Enteritis 12/02/2016  . Hyponatremia 12/02/2016  . Status post total shoulder arthroplasty, left 11/21/2016  . COPD (chronic obstructive pulmonary disease) ([place]) 11/21/2016  . Weakness 11/19/2016  . DJD of left shoulder 11/15/2016  . DOE (dyspnea on exertion) 11/11/2016  . Rotator cuff arthropathy of left shoulder 10/30/2016  . Intertrochanteric fracture of right hip ([place]) 08/01/2015    Class: Acute  . SMA stenosis ([place]) 09/15/2014  . Abdominal pain, right upper quadrant 09/15/2014  . Essential hypertension 09/15/2014  . Depression 09/15/2014  . Hypothyroid 09/15/2014  . GERD without esophagitis 09/15/2014  . Lumbago 11/12/2013  . Stiffness of joint, not elsewhere classified, pelvic region and thigh 11/12/2013  . Muscle  weakness (generalized) 11/12/2013  . Lack of coordination 03/21/2013  . Fine motor impairment 03/21/2013  . Left leg weakness 03/17/2013  . Risk for falls 03/17/2013  . Carotid artery disease ([place]) 03/11/2013  . Subclavian artery stenosis ([place]) 03/11/2013  . TIA (transient ischemic attack) 03/11/2013  . Tobacco use disorder 03/11/2013  . CVA (cerebral infarction) 03/05/2013  . Pain in joint, shoulder region 11/01/2010   Ihor Austin, [employer]; [place]  Aldona Lento 06/21/2017, [facility] [address] [place], [place], [address] Phone: [phone]   Fax:  [phone]  Name: MYCHAL DURIO MRN: [id number] Date of Birth: Sep 06, 1935

## 2017-06-26 ENCOUNTER — Telehealth (HOSPITAL_COMMUNITY): Payer: Self-pay | Admitting: Physical Therapy

## 2017-06-26 ENCOUNTER — Ambulatory Visit (HOSPITAL_COMMUNITY): Payer: PPO | Admitting: Physical Therapy

## 2017-06-26 NOTE — Telephone Encounter (Signed)
Therapist called and left a message about patient not showing up for appointment at 1 pm this afternoon. Discussed with patient the attendance policy and that in the future she should call 24 hours in advance if she is unable to make it to the appointment. Also reminded patient of her next appointment. Call completed around 3:40 PM.   Clarene Critchley PT, DPT 3:41 PM, 06/26/17 [phone]

## 2017-06-28 ENCOUNTER — Encounter (HOSPITAL_COMMUNITY): Payer: Self-pay

## 2017-06-28 ENCOUNTER — Ambulatory Visit (HOSPITAL_COMMUNITY): Payer: PPO

## 2017-06-28 DIAGNOSIS — M25512 Pain in left shoulder: Secondary | ICD-10-CM

## 2017-06-28 DIAGNOSIS — M6281 Muscle weakness (generalized): Secondary | ICD-10-CM

## 2017-06-28 DIAGNOSIS — M546 Pain in thoracic spine: Secondary | ICD-10-CM

## 2017-06-28 DIAGNOSIS — M25612 Stiffness of left shoulder, not elsewhere classified: Secondary | ICD-10-CM

## 2017-06-28 DIAGNOSIS — R29898 Other symptoms and signs involving the musculoskeletal system: Secondary | ICD-10-CM

## 2017-06-28 NOTE — Patient Instructions (Signed)
Shoulder: Flexion (Supine)    With hands shoulder width apart, slowly lower dowel to floor behind head. Do not let elbows bend. Keep back flat. Hold 5-10 seconds. Repeat 10-20 times. Do 1-2 sessions per day. CAUTION: Stretch slowly and gently.  Copyright  VHI. All rights reserved.   Shoulder: Abduction (Supine)    With right arm flat on floor, hold dowel in palm. Slowly move arm up to side of head by pushing with opposite arm. Do not let elbow bend. Hold 5-10 seconds. Repeat 10-20 times. Do 1-2 sessions per day. CAUTION: Stretch slowly and gently.  Copyright  VHI. All rights reserved.

## 2017-06-28 NOTE — Therapy (Signed)
[place], [place], [address] Phone: [phone]   Fax:  [phone]  Physical Therapy Treatment  Patient Details  Name: [name] MRN: [id number] Date of Birth: September 09, 1935 Referring Provider: Renette Butters, MD   Encounter Date: 06/28/2017  PT End of Session - 06/28/17 1609    Visit Number  7    Number of Visits  17    Date for PT Re-Evaluation  07/31/17 Minireassess 07/03/17    Authorization Type  [employer] (No auth required)    Authorization Time Period  06/05/17 - 07/31/17    Authorization - Visit Number  7    Authorization - Number of Visits  10    PT Start Time  8756    PT Stop Time  4332    PT Time Calculation (min)  47 min    Activity Tolerance  Patient tolerated treatment well;Patient limited by fatigue;No increased pain    Behavior During Therapy  WFL for tasks assessed/performed       Past Medical History:  Diagnosis Date  . Anxiety   . Arthritis   . Carotid artery disease ([place]) 2007   R CEA; dopplers 10/2016:  R ICA moderate (non-obstructive plaque) ; L ICA ~50-69%, but may underestimate. (reassess next with either  CTA versus formal cerebral angiogram)   . Constipation due to pain medication   . COPD (chronic obstructive pulmonary disease) ([place])   . Depression   . Diverticulosis   . Fibromyalgia   . Gastritis   . GERD (gastroesophageal reflux disease)   . Head injury   . History of pneumonia   . History of stroke 95/1884   complication of R SubClav A PTA -- R hemispheric CVA  . Hypertension   . Hypothyroidism   . Osteoarthritis   . Peripheral vascular disease ([place])    Bilateral subclavian artery disease (status post R SubClav A PTA).  R side CEA (with US findings of Mod-Severe L CIA stenosis).  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   . Urgency incontinence     Past Surgical History:  Procedure Laterality Date  . ANTERIOR AND POSTERIOR REPAIR   12/2000   Archie Endo 07/04/2010  . ANTERIOR CERVICAL DECOMP/DISCECTOMY FUSION N/A 04/03/2012   Procedure: ANTERIOR CERVICAL DECOMPRESSION/DISCECTOMY FUSION 2 LEVELS;  Surgeon: Hosie Spangle, MD;  Location: [facility];  Service: Neurosurgery;  Laterality: N/A;  Cervical four-five,Cervical five-six anterior cervical decompression with fusion plating and bonegraft  . BACK SURGERY    . BREAST SURGERY Right 02/2007   Archie Endo 06/23/2010  . BUNIONECTOMY WITH HAMMERTOE RECONSTRUCTION Left 09/10/2007   Archie Endo 06/23/2010  . CAROTID ENDARTERECTOMY Right 2007   [facility], Dr. Lucky Cowboy  . CATARACT EXTRACTION W/ INTRAOCULAR LENS IMPLANT Left    Archie Endo 06/23/2010  . CHOLECYSTECTOMY    . COLONOSCOPY N/A 07/15/2014   Procedure: COLONOSCOPY;  Surgeon: Rogene Houston, MD;  [facility];  Service: Endoscopy;  Laterality: N/A;  200 - moved to 2:10 - Ann to notify pt  . COMPRESSION HIP SCREW Right 08/01/2015   Procedure: COMPRESSION HIP;  Surgeon: Carole Civil, MD;  Location: [facility];  Service: Orthopedics;  Laterality: Right;  . ESOPHAGOGASTRODUODENOSCOPY N/A 07/15/2014   Procedure: ESOPHAGOGASTRODUODENOSCOPY (EGD);  Surgeon: Rogene Houston, MD;  [facility];  Service: Endoscopy;  Laterality: N/A;  . EYE SURGERY     cataract /lens both eyes  . FRACTURE SURGERY    .  INCONTINENCE SURGERY  12/2000   Tension-free transvaginal tape procedure/notes 07/04/2010  . JOINT REPLACEMENT    . LUMBAR FUSION  08/2006   Archie Endo 06/23/2010  . PERIPHERAL VASCULAR BALLOON ANGIOPLASTY  02/2013   Aortic arch angiography revealed 70% innominate/R subclavian stenosis along with 60% left common carotid ostial stenosis and moderate left subclavian artery stenosis. ->  Innominate artery PTA with 7 mm balloon reducing stenosis to roughly 30%.  Marland Kitchen PERIPHERAL VASCULAR CATHETERIZATION N/A 10/08/2014   Procedure: Visceral Angiography;  Surgeon: Algernon Huxley, MD;  Location: [facility];  Service: Cardiovascular;  - long  stenosis/occlusion of SMA. 75% celiac artery (PTA with 6 mm balloon)  . PERIPHERAL VASCULAR CATHETERIZATION N/A 10/08/2014   Procedure: Visceral Artery Intervention;  Surgeon: Algernon Huxley, MD;  Location: [facility];  Service: Cardiovascular;: 6 mm PTA balloon to stenosed CELIAC ARTERY  . ROTATOR CUFF REPAIR Left X 3  . TOTAL KNEE ARTHROPLASTY Bilateral 2005; 2011   right; left  . TOTAL SHOULDER ARTHROPLASTY Left 11/15/2016  . TOTAL SHOULDER ARTHROPLASTY Left 11/15/2016   Procedure: LEFT TOTAL SHOULDER ARTHROPLASTY;  Surgeon: [name], MD;  Location: [facility];  Service: Orthopedics;  Laterality: Left;  . TRANSTHORACIC ECHOCARDIOGRAM  10/2016   Normal LV wall thickness with LVEF 60-65% and grade 1 diastolic dysfunction.  Aortic valve calcific sclerosis with no stenosis.  Mildly calcified mitral annulus and leaflets.   Marland Kitchen VAGINAL HYSTERECTOMY  12/2000   Archie Endo 07/04/2010    There were no vitals filed for this visit.  Subjective Assessment - 06/28/17 1603    Subjective  Pt stated she has been visiting her sister in Hospice today.  Continues to have LBP pain scale 4/10 and Lt arm pain 3/10.  Has began with decompression exercises at home.      Patient Stated Goals  To get more motion in her left shoulder and to be able to walk longer    Currently in Pain?  Yes    Pain Score  3     Pain Location  Arm    Pain Orientation  Left    Pain Descriptors / Indicators  Aching    Pain Type  Chronic pain    Pain Onset  More than a month ago    Pain Frequency  Intermittent    Aggravating Factors   stretches    Pain Relieving Factors  rest, biofreeze    Pain Score  4    Pain Location  Back    Pain Orientation  Lower    Pain Descriptors / Indicators  Aching    Pain Type  Chronic pain    Pain Onset  More than a month ago    Pain Frequency  Intermittent    Aggravating Factors   sweeping/mopping, walking    Pain Relieving Factors  rest    Effect of Pain on Daily Activities  limits                        [facility] Adult PT Treatment/Exercise - 06/28/17 0001      Shoulder Exercises: Supine   External Rotation  AAROM    Flexion  AAROM;Limitations dowel rod    ABduction  AAROM;Left;10 reps;Limitations    ABduction Limitations  dowel rod    Other Supine Exercises  Supine bridges 15x 5"    Other Supine Exercises  decompression wtih RTB; overhead, sidepull and sash (therapist assisntance      Shoulder Exercises: Seated  Other Seated Exercises  table slides for abd and flexion 15each      Shoulder Exercises: Standing   Extension  Strengthening;20 reps;Theraband;Limitations    Theraband Level (Shoulder Extension)  Level 2 (Red)    Extension Limitations  2x 10    Row  Strengthening;Both;AROM;20 reps;Other (comment);Limitations    Theraband Level (Shoulder Row)  Level 2 (Red)    Row Limitations  2x 10      Shoulder Exercises: Pulleys   Flexion  2 minutes cueing for mechanics    ABduction  2 minutes cueing for mechanics      Shoulder Exercises: ROM/Strengthening   Other ROM/Strengthening Exercises  wall walk 5x each (Lt 11 on peg board and Rt 12 peg board) cueing to lower elbow      Manual Therapy   Manual Therapy  Passive ROM    Manual therapy comments  Manual complete separate than rest of tx    Passive ROM  PROM Lt UE as tolerated all directions               PT Short Term Goals - 06/05/17 2128      PT SHORT TERM GOAL #1   Title  Patient will demonstrate understanding and report regular compliance with home exercise program to improve strength, range of motion, and decrease pain.     Time  4    Period  Weeks    Status  New    Target Date  07/03/17      PT SHORT TERM GOAL #2   Title  Patient will demonstrate improvement of 1/2 Grade MMT strength in all musculature tested as deficient at evaluation to assist patient with lifting activities, functional squatting, and other functional mobility.     Time  4    Period  Weeks    Status  New     Target Date  07/03/17      PT SHORT TERM GOAL #3   Title  Patient will report no greater than a 5/10 pain in her left shoulder or back over the course of a 1 week period indicating better tolerance with daily activities.     Time  4    Period  Weeks    Status  New    Target Date  07/03/17      PT SHORT TERM GOAL #4   Title  Patient will demonstrate improvement of 10 degrees in left shoulder active range of motion in all movements measured as deficient at evaluation to assist patient with lifting activities, and patient will be able to perform external rotation of left shoulder to touch base of neck and internal rotation of left shoulder to reach T5 for increased ease of adjusting clothing.     Time  4    Period  Weeks    Status  New    Target Date  07/03/17        PT Long Term Goals - 06/05/17 2142      PT LONG TERM GOAL #1   Title  Patient will demonstrate improvement of 1 Grade MMT strength in all musculature tested as deficient at evaluation to assist patient with lifting activities, functional squatting, and other functional mobility.     Time  8    Period  Weeks    Status  New    Target Date  07/31/17      PT LONG TERM GOAL #2   Title  Patient will demonstrate improvement of 20 degrees in left shoulder active  range of motion in all movements measured as deficient at evaluation to assist patient with lifting activities.     Time  8    Period  Weeks    Status  New    Target Date  07/31/17      PT LONG TERM GOAL #3   Title  Patient will perform the 5 times sit to stand test in 13 seconds or less indicating improved balance, decreased risk of falls, and overall improved functional mobility.     Time  8    Period  Weeks    Status  New    Target Date  07/31/17      PT LONG TERM GOAL #4   Title  Patient will perform lifting 1 box with 5 pound weight to shoulder level without increase of shoulder pain indicating improved ability to perform lifting in daily routine.      Time  8    Period  Weeks    Status  New    Target Date  07/31/17      PT LONG TERM GOAL #5   Title  Patient will report no greater than a 3/10 pain in either her left shoulder or back over the course of a 1 week period indicating improved tolerance to daily activities.     Time  8    Period  Weeks    Status  New    Target Date  07/31/17            Plan - 06/28/17 1636    Clinical Impression Statement  Continued session focus with shoulder mobility and postural strengthening.  Progressed to more AAROM based exercises this session with therapist facilitation to assure correct form and reduce compensation.  Pt able to demonstrate good form with supine dowel rod activities, added to HEP.  Progressed decompression exercises with RTB for postural and UE strengthening.  No reports of increased pain through session was limited by fatigue.    Rehab Potential  Fair    Clinical Impairments Affecting Rehab Potential  Positive: Patient's motivation; Negative: comorbidities, chronicity of pain    PT Frequency  2x / week    PT Duration  8 weeks    PT Treatment/Interventions  ADLs/Self Care Home Management;Aquatic Therapy;Cryotherapy;Electrical Stimulation;Moist Heat;DME Instruction;Gait training;Stair training;Functional mobility training;Therapeutic activities;Therapeutic exercise;Balance training;Neuromuscular re-education;Patient/family education;Manual techniques;Passive range of motion;Dry needling;Energy conservation;Taping    PT Next Visit Plan  Minireassess next session.  Continue with AAROM for left shoulder; Lower extremity functional strengthening. Continue to progress with both shoulder, postural and lower extremity strengthening exercises.     PT Home Exercise Plan  06/07/17: Wall walk left upper extremity 1 x 15 5 second holds 1x/day; Pec stretch 5 x 20 seconds 2x/day each upper extremity; Shoulder extension/rows/scapular retraction with RTB 2 x 10 bilateral upper extremities 1x/day, 06/21/17;  decompression exercise       Patient will benefit from skilled therapeutic intervention in order to improve the following deficits and impairments:  Increased fascial restricitons, Improper body mechanics, Pain, Decreased mobility, Postural dysfunction, Decreased activity tolerance, Decreased endurance, Decreased range of motion, Decreased strength, Hypomobility, Impaired UE functional use, Decreased balance, Difficulty walking  Visit Diagnosis: Left shoulder pain, unspecified chronicity  Pain in thoracic spine  Other symptoms and signs involving the musculoskeletal system  Stiffness of left shoulder, not elsewhere classified  Muscle weakness (generalized)     Problem List Patient Active Problem List   Diagnosis Date Noted  . Acute colitis 12/02/2016  . Uncontrolled hypertension  12/02/2016  . Enteritis 12/02/2016  . Hyponatremia 12/02/2016  . Status post total shoulder arthroplasty, left 11/21/2016  . COPD (chronic obstructive pulmonary disease) ([place]) 11/21/2016  . Weakness 11/19/2016  . DJD of left shoulder 11/15/2016  . DOE (dyspnea on exertion) 11/11/2016  . Rotator cuff arthropathy of left shoulder 10/30/2016  . Intertrochanteric fracture of right hip ([place]) 08/01/2015    Class: Acute  . SMA stenosis ([place]) 09/15/2014  . Abdominal pain, right upper quadrant 09/15/2014  . Essential hypertension 09/15/2014  . Depression 09/15/2014  . Hypothyroid 09/15/2014  . GERD without esophagitis 09/15/2014  . Lumbago 11/12/2013  . Stiffness of joint, not elsewhere classified, pelvic region and thigh 11/12/2013  . Muscle weakness (generalized) 11/12/2013  . Lack of coordination 03/21/2013  . Fine motor impairment 03/21/2013  . Left leg weakness 03/17/2013  . Risk for falls 03/17/2013  . Carotid artery disease ([place]) 03/11/2013  . Subclavian artery stenosis ([place]) 03/11/2013  . TIA (transient ischemic attack) 03/11/2013  . Tobacco use disorder 03/11/2013  . CVA (cerebral  infarction) 03/05/2013  . Pain in joint, shoulder region 11/01/2010   Ihor Austin, [place]; [place]  Aldona Lento 06/28/2017, 5:05 PM  [facility] [address] [place], [place], [address] Phone: [phone]   Fax:  [phone]  Name: MAHALIE KANNER MRN: [id number] Date of Birth: 1935-08-31

## 2017-07-03 ENCOUNTER — Telehealth (HOSPITAL_COMMUNITY): Payer: Self-pay | Admitting: Pulmonary Disease

## 2017-07-03 ENCOUNTER — Ambulatory Visit (HOSPITAL_COMMUNITY): Payer: PPO | Admitting: Physical Therapy

## 2017-07-03 NOTE — Telephone Encounter (Signed)
07/03/17  pt called to cx said her colitis had flared up and hopefully would be here thursday

## 2017-07-05 ENCOUNTER — Ambulatory Visit (HOSPITAL_COMMUNITY): Payer: PPO

## 2017-07-05 DIAGNOSIS — M25512 Pain in left shoulder: Secondary | ICD-10-CM

## 2017-07-05 DIAGNOSIS — M6281 Muscle weakness (generalized): Secondary | ICD-10-CM

## 2017-07-05 DIAGNOSIS — M546 Pain in thoracic spine: Secondary | ICD-10-CM

## 2017-07-05 DIAGNOSIS — R29898 Other symptoms and signs involving the musculoskeletal system: Secondary | ICD-10-CM

## 2017-07-05 DIAGNOSIS — M25612 Stiffness of left shoulder, not elsewhere classified: Secondary | ICD-10-CM

## 2017-07-05 NOTE — Therapy (Signed)
[place], [place], [address] Phone: [phone]   Fax:  [phone]    Progress Note Reporting Period 06/05/17 to 07/05/17  See note below for Objective Data and Assessment of Progress/Goals.     Physical Therapy Treatment/Reassessment  Patient Details  Name: [name] MRN: [id number] Date of Birth: [birth year] Referring Provider: Edmonia Lynch, MD   Encounter Date: 07/05/2017  PT End of Session - 07/05/17 1121    Visit Number  8    Number of Visits  17    Date for PT Re-Evaluation  07/31/17 Minireassess completed 07/05/17    Authorization Type  [employer] (No auth required)    Authorization Time Period  06/05/17 - 07/31/17    Authorization - Visit Number  8    Authorization - Number of Visits  10    PT Start Time  1120    PT Stop Time  1200    PT Time Calculation (min)  40 min    Activity Tolerance  Patient tolerated treatment well;Patient limited by fatigue;No increased pain    Behavior During Therapy  WFL for tasks assessed/performed       Past Medical History:  Diagnosis Date  . Anxiety   . Arthritis   . Carotid artery disease ([place]) 2007   R CEA; dopplers 10/2016:  R ICA moderate (non-obstructive plaque) ; L ICA ~50-69%, but may underestimate. (reassess next with either  CTA versus formal cerebral angiogram)   . Constipation due to pain medication   . COPD (chronic obstructive pulmonary disease) ([place])   . Depression   . Diverticulosis   . Fibromyalgia   . Gastritis   . GERD (gastroesophageal reflux disease)   . Head injury   . History of pneumonia   . History of stroke 10/9831   complication of R SubClav A PTA -- R hemispheric CVA  . Hypertension   . Hypothyroidism   . Osteoarthritis   . Peripheral vascular disease ([place])    Bilateral subclavian artery disease (status post R SubClav A PTA).  R side CEA (with US findings of Mod-Severe L CIA stenosis).  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   . Urgency incontinence     Past Surgical History:  Procedure Laterality Date  . ANTERIOR AND POSTERIOR REPAIR  12/2000   Archie Endo 07/04/2010  . ANTERIOR CERVICAL DECOMP/DISCECTOMY FUSION N/A 04/03/2012   Procedure: ANTERIOR CERVICAL DECOMPRESSION/DISCECTOMY FUSION 2 LEVELS;  Surgeon: Hosie Spangle, MD;  Location: [facility];  Service: Neurosurgery;  Laterality: N/A;  Cervical four-five,Cervical five-six anterior cervical decompression with fusion plating and bonegraft  . BACK SURGERY    . BREAST SURGERY Right 02/2007   Ar[facility] Endo 06/23/2010  . BUNIONECTOMY WITH HAMMERTOE RECONSTRUCTION Left 09/10/2007   [facility] Endo 06/23/2010  . CAROTID ENDARTERECTOMY Right 2007   [facility], Dr. Lucky Cowboy  . CATARACT EXTRACTION W/ INTRAOCULAR LENS IMPLANT Left    [facility] Endo 06/23/2010  . CHOLECYSTECTOMY    . COLONOSCOPY N/A 07/15/2014   Procedure: COLONOSCOPY;  Surgeon: Rogene Houston, MD;  [facility];  Service: Endoscopy;  Laterality: N/A;  200 - moved to 2:10 - Ann to notify pt  . COMPRESSION HIP SCREW Right 08/01/2015   Procedure: COMPRESSION HIP;  Surgeon: Carole Civil, MD;  Location: [facility];  Service: Orthopedics;  Laterality: Right;  . ESOPHAGOGASTRODUODENOSCOPY N/A 07/15/2014   Procedure: ESOPHAGOGASTRODUODENOSCOPY (EGD);  Surgeon: Rogene Houston, MD;  [facility];  Service:  Endoscopy;  Laterality: N/A;  . EYE SURGERY     cataract /lens both eyes  . FRACTURE SURGERY    . INCONTINENCE SURGERY  12/2000   Tension-free transvaginal tape procedure/notes 07/04/2010  . JOINT REPLACEMENT    . LUMBAR FUSION  08/2006   [facility] Endo 06/23/2010  . PERIPHERAL VASCULAR BALLOON ANGIOPLASTY  02/2013   Aortic arch angiography revealed 70% innominate/R subclavian stenosis along with 60% left common carotid ostial stenosis and moderate left subclavian artery stenosis. ->  Innominate artery PTA with 7 mm balloon reducing stenosis to roughly 30%.  Marland Kitchen PERIPHERAL VASCULAR CATHETERIZATION  N/A 10/08/2014   Procedure: Visceral Angiography;  Surgeon: Algernon Huxley, MD;  Location: [facility];  Service: Cardiovascular;  - long stenosis/occlusion of SMA. 75% celiac artery (PTA with 6 mm balloon)  . PERIPHERAL VASCULAR CATHETERIZATION N/A 10/08/2014   Procedure: Visceral Artery Intervention;  Surgeon: Algernon Huxley, MD;  Location: [facility];  Service: Cardiovascular;: 6 mm PTA balloon to stenosed CELIAC ARTERY  . ROTATOR CUFF REPAIR Left X 3  . TOTAL KNEE ARTHROPLASTY Bilateral 2005; 2011   right; left  . TOTAL SHOULDER ARTHROPLASTY Left 11/15/2016  . TOTAL SHOULDER ARTHROPLASTY Left 11/15/2016   Procedure: LEFT TOTAL SHOULDER ARTHROPLASTY;  Surgeon: [name], MD;  Location: [facility];  Service: Orthopedics;  Laterality: Left;  . TRANSTHORACIC ECHOCARDIOGRAM  10/2016   Normal LV wall thickness with LVEF 60-65% and grade 1 diastolic dysfunction.  Aortic valve calcific sclerosis with no stenosis.  Mildly calcified mitral annulus and leaflets.   Marland Kitchen VAGINAL HYSTERECTOMY  12/2000   [facility] Endo 07/04/2010    There were no vitals filed for this visit.  Subjective Assessment - 07/05/17 1121    Subjective  Pt states that she is tired, she just went to [place]. Her pain is not good right now. She reports L shoudler pain at abotu 5/10 and LBP at 3/10.    Patient Stated Goals  To get more motion in her left shoulder and to be able to walk longer    Currently in Pain?  Yes    Pain Score  5     Pain Location  Arm    Pain Orientation  Left    Pain Descriptors / Indicators  Aching    Pain Type  Chronic pain    Pain Onset  More than a month ago    Pain Frequency  Intermittent    Aggravating Factors   stretches    Pain Relieving Factors  rest, biofreeze    Pain Score  3    Pain Location  Back    Pain Orientation  Lower    Pain Descriptors / Indicators  Aching    Pain Type  Chronic pain    Pain Onset  More than a month ago    Pain Frequency  Intermittent    Aggravating Factors    sweeping/mopping, walking    Pain Relieving Factors  rest    Effect of Pain on Daily Activities  limits         [facility] PT Assessment - 07/05/17 0001      Assessment   Medical Diagnosis  lumbago; Left reverse total shoulder arthroplasty     Referring Provider  Edmonia Lynch, MD    Onset Date/Surgical Date  -- shoulder surgery 11/15/16    Hand Dominance  Left    Next MD Visit  07/20/17    Prior Therapy  Yes. For her back, and shoulder at home  health therapy      Observation/Other Assessments   Focus on Therapeutic Outcomes (FOTO)   46% limitation was 62% limited      AROM   Left Shoulder Flexion  104 Degrees was 100    Left Shoulder ABduction  90 Degrees was 90    Left Shoulder Internal Rotation  50 Degrees reached L5; was 50 deg, reached L5    Left Shoulder External Rotation  5 Degrees Unable to reach base of neck; was 5deg      Strength   Right Shoulder Extension  4+/5 was 4+    Right Shoulder ABduction  4+/5 was 4+    Right Shoulder Internal Rotation  4+/5 was 4+    Right Shoulder External Rotation  3-/5 was 3-    Left Shoulder Flexion  4/5 was 4+    Left Shoulder Extension  4+/5 was 4+    Left Shoulder ABduction  4/5 was 4+    Left Shoulder Internal Rotation  4/5 was 4+    Left Shoulder External Rotation  3+/5 was 4+    Right Elbow Flexion  5/5 was 4+    Right Elbow Extension  5/5 was 4+    Left Elbow Flexion  5/5 was 4+    Left Elbow Extension  4+/5 was 4+    Right Hip Flexion  4/5 was 4    Right Hip Extension  2+/5 was 2+    Right Hip ABduction  -- was 4    Left Hip Flexion  4/5 was 4    Left Hip Extension  2+/5 was 2+    Left Hip ABduction  4-/5 was 4      Standardized Balance Assessment   Standardized Balance Assessment  Five Times Sit to Stand    Five times sit to stand comments   19.7sec, hands on thighs was 20sec with intermittent UE use              [facility] Adult PT Treatment/Exercise - 07/05/17 0001      Shoulder Exercises: Seated   Other Seated  Exercises  table slides for flexion while sitting on stool 5x10-15" holds      Manual Therapy   Manual Therapy  Passive ROM;Manual Traction    Manual therapy comments  Manual complete separate than rest of tx    Passive ROM  L GH PROM all directions;     Manual Traction  L GH distraction 5x15-20" bouts each             PT Education - 07/05/17 1121    Education provided  Yes    Education Details  reassessment findings, continue HEP    Person(s) Educated  Patient    Methods  Explanation;Demonstration    Comprehension  Verbalized understanding;Returned demonstration;Verbal cues required;Tactile cues required;Need further instruction       PT Short Term Goals - 07/05/17 1124      PT SHORT TERM GOAL #1   Title  Patient will demonstrate understanding and report regular compliance with home exercise program to improve strength, range of motion, and decrease pain.     Baseline  5/16: reports compliance with HEP at least 1x/day    Time  4    Period  Weeks    Status  Achieved      PT SHORT TERM GOAL #2   Title  Patient will demonstrate improvement of 1/2 Grade MMT strength in all musculature tested as deficient at evaluation to assist patient with lifting activities,  functional squatting, and other functional mobility.     Time  4    Period  Weeks    Status  New      PT SHORT TERM GOAL #3   Title  Patient will report no greater than a 5/10 pain in her left shoulder or back over the course of a 1 week period indicating better tolerance with daily activities.     Time  4    Period  Weeks    Status  Achieved      PT SHORT TERM GOAL #4   Title  Patient will demonstrate improvement of 10 degrees in left shoulder active range of motion in all movements measured as deficient at evaluation to assist patient with lifting activities, and patient will be able to perform external rotation of left shoulder to touch base of neck and internal rotation of left shoulder to reach T5 for increased  ease of adjusting clothing.     Time  4    Period  Weeks    Status  New        PT Long Term Goals - 07/05/17 1126      PT LONG TERM GOAL #1   Title  Patient will demonstrate improvement of 1 Grade MMT strength in all musculature tested as deficient at evaluation to assist patient with lifting activities, functional squatting, and other functional mobility.     Time  8    Period  Weeks    Status  New      PT LONG TERM GOAL #2   Title  Patient will demonstrate improvement of 20 degrees in left shoulder active range of motion in all movements measured as deficient at evaluation to assist patient with lifting activities.     Time  8    Period  Weeks    Status  New      PT LONG TERM GOAL #3   Title  Patient will perform the 5 times sit to stand test in 13 seconds or less indicating improved balance, decreased risk of falls, and overall improved functional mobility.     Time  8    Period  Weeks    Status  New      PT LONG TERM GOAL #4   Title  Patient will perform lifting 1 box with 5 pound weight to shoulder level without increase of shoulder pain indicating improved ability to perform lifting in daily routine.     Time  8    Period  Weeks    Status  New      PT LONG TERM GOAL #5   Title  Patient will report no greater than a 3/10 pain in either her left shoulder or back over the course of a 1 week period indicating improved tolerance to daily activities.     Baseline  5/16: "at times"    Time  8    Period  Weeks    Status  On-going            Plan - 07/05/17 1205    Clinical Impression Statement  PT reassessed pt's goals and outcome measures this date. Pt has met 2 STG while all other goals are still on-going. She has made some improvements in MMT and AROM throughout thus far, but her pain and functional deficits due to her impairments in AROM and MMT are still evident. PT performed L GH distraction at [facility] and she reported decreased pain to 3/10 (was 5/10 at beginning).  PT recommends performing more joint mobilization techniques for pain control and ROM as she responded well to it this date. Pt needs continued skilled PT intervention to address remaining deficits in order to decrease pain and improve overall function and QOL.     Rehab Potential  Fair    Clinical Impairments Affecting Rehab Potential  Positive: Patient's motivation; Negative: comorbidities, chronicity of pain    PT Frequency  2x / week    PT Duration  8 weeks    PT Treatment/Interventions  ADLs/Self Care Home Management;Aquatic Therapy;Cryotherapy;Electrical Stimulation;Moist Heat;DME Instruction;Gait training;Stair training;Functional mobility training;Therapeutic activities;Therapeutic exercise;Balance training;Neuromuscular re-education;Patient/family education;Manual techniques;Passive range of motion;Dry needling;Energy conservation;Taping    PT Next Visit Plan  perform L GH joint mobs all directions for pain control and ROM improvements; continue manual distraction for pain control; Continue with AAROM for left shoulder; Lower extremity functional strengthening. Continue to progress with both shoulder, postural and lower extremity strengthening exercises.     PT Home Exercise Plan  06/07/17: Wall walk left upper extremity 1 x 15 5 second holds 1x/day; Pec stretch 5 x 20 seconds 2x/day each upper extremity; Shoulder extension/rows/scapular retraction with RTB 2 x 10 bilateral upper extremities 1x/day, 06/21/17; decompression exercise    Consulted and Agree with Plan of Care  Patient       Patient will benefit from skilled therapeutic intervention in order to improve the following deficits and impairments:  Increased fascial restricitons, Improper body mechanics, Pain, Decreased mobility, Postural dysfunction, Decreased activity tolerance, Decreased endurance, Decreased range of motion, Decreased strength, Hypomobility, Impaired UE functional use, Decreased balance, Difficulty walking  Visit  Diagnosis: Left shoulder pain, unspecified chronicity  Pain in thoracic spine  Other symptoms and signs involving the musculoskeletal system  Stiffness of left shoulder, not elsewhere classified  Muscle weakness (generalized)     Problem List Patient Active Problem List   Diagnosis Date Noted  . Acute colitis 12/02/2016  . Uncontrolled hypertension 12/02/2016  . Enteritis 12/02/2016  . Hyponatremia 12/02/2016  . Status post total shoulder arthroplasty, left 11/21/2016  . COPD (chronic obstructive pulmonary disease) ([place]) 11/21/2016  . Weakness 11/19/2016  . DJD of left shoulder 11/15/2016  . DOE (dyspnea on exertion) 11/11/2016  . Rotator cuff arthropathy of left shoulder 10/30/2016  . Intertrochanteric fracture of right hip ([place]) 08/01/2015    Class: Acute  . SMA stenosis ([place]) 09/15/2014  . Abdominal pain, right upper quadrant 09/15/2014  . Essential hypertension 09/15/2014  . Depression 09/15/2014  . Hypothyroid 09/15/2014  . GERD without esophagitis 09/15/2014  . Lumbago 11/12/2013  . Stiffness of joint, not elsewhere classified, pelvic region and thigh 11/12/2013  . Muscle weakness (generalized) 11/12/2013  . Lack of coordination 03/21/2013  . Fine motor impairment 03/21/2013  . Left leg weakness 03/17/2013  . Risk for falls 03/17/2013  . Carotid artery disease ([place]) 03/11/2013  . Subclavian artery stenosis ([place]) 03/11/2013  . TIA (transient ischemic attack) 03/11/2013  . Tobacco use disorder 03/11/2013  . CVA (cerebral infarction) 03/05/2013  . Pain in joint, shoulder region 11/01/2010       Geraldine Solar PT, DPT  [facility] [address] [place], [place], [address] Phone: [phone]   Fax:  [phone]  Name: TIRSA GAIL MRN: [id number] Date of Birth: [birth year]

## 2017-07-10 ENCOUNTER — Ambulatory Visit (HOSPITAL_COMMUNITY): Payer: PPO | Admitting: Physical Therapy

## 2017-07-10 ENCOUNTER — Encounter (HOSPITAL_COMMUNITY): Payer: Self-pay | Admitting: Physical Therapy

## 2017-07-10 DIAGNOSIS — M6281 Muscle weakness (generalized): Secondary | ICD-10-CM

## 2017-07-10 DIAGNOSIS — R29898 Other symptoms and signs involving the musculoskeletal system: Secondary | ICD-10-CM

## 2017-07-10 DIAGNOSIS — M546 Pain in thoracic spine: Secondary | ICD-10-CM

## 2017-07-10 DIAGNOSIS — M25512 Pain in left shoulder: Secondary | ICD-10-CM

## 2017-07-10 DIAGNOSIS — M25612 Stiffness of left shoulder, not elsewhere classified: Secondary | ICD-10-CM

## 2017-07-10 NOTE — Patient Instructions (Signed)
  SIT TO STAND - NO SUPPORT Start by scooting close to the front of the chair. Next, lean forward at your trunk and reach forward with your arms and rise to standing without using your hands to push off from the chair or other object. Use your arms as a counter-balance by reaching forward when in sitting and lower them as you approach standing. Repeat 10 Times Hold 1 Second Complete 2 Sets Perform 3 Time(s) a Week

## 2017-07-10 NOTE — Therapy (Signed)
[place], [place], [address] Phone: [phone]   Fax:  [phone]  Physical Therapy Treatment  Patient Details  Name: [name] MRN: [id number] Date of Birth: May 28, 1935 Referring Provider: Edmonia Lynch, MD   Encounter Date: 07/10/2017  PT End of Session - 07/10/17 1625    Visit Number  9    Number of Visits  17    Date for PT Re-Evaluation  07/31/17 Minireassess completed 07/05/17    Authorization Type  [employer] (No auth required)    Authorization Time Period  06/05/17 - 07/31/17    Authorization - Visit Number  1    Authorization - Number of Visits  10    PT Start Time  1300    PT Stop Time  1340    PT Time Calculation (min)  40 min    Activity Tolerance  Patient tolerated treatment well;Patient limited by fatigue;No increased pain    Behavior During Therapy  WFL for tasks assessed/performed       Past Medical History:  Diagnosis Date  . Anxiety   . Arthritis   . Carotid artery disease ([place]) 2007   R CEA; dopplers 10/2016:  R ICA moderate (non-obstructive plaque) ; L ICA ~50-69%, but may underestimate. (reassess next with either  CTA versus formal cerebral angiogram)   . Constipation due to pain medication   . COPD (chronic obstructive pulmonary disease) ([place])   . Depression   . Diverticulosis   . Fibromyalgia   . Gastritis   . GERD (gastroesophageal reflux disease)   . Head injury   . History of pneumonia   . History of stroke 24/2683   complication of R SubClav A PTA -- R hemispheric CVA  . Hypertension   . Hypothyroidism   . Osteoarthritis   . Peripheral vascular disease ([place])    Bilateral subclavian artery disease (status post R SubClav A PTA).  R side CEA (with US findings of Mod-Severe L CIA stenosis).  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   . Urgency incontinence     Past Surgical History:  Procedure Laterality Date  . ANTERIOR AND POSTERIOR  REPAIR  12/2000   Archie Endo 07/04/2010  . ANTERIOR CERVICAL DECOMP/DISCECTOMY FUSION N/A 04/03/2012   Procedure: ANTERIOR CERVICAL DECOMPRESSION/DISCECTOMY FUSION 2 LEVELS;  Surgeon: Hosie Spangle, MD;  Location: [facility];  Service: Neurosurgery;  Laterality: N/A;  Cervical four-five,Cervical five-six anterior cervical decompression with fusion plating and bonegraft  . BACK SURGERY    . BREAST SURGERY Right 02/2007   Archie Endo 06/23/2010  . BUNIONECTOMY WITH HAMMERTOE RECONSTRUCTION Left 09/10/2007   Archie Endo 06/23/2010  . CAROTID ENDARTERECTOMY Right 2007   [facility], Dr. Lucky Cowboy  . CATARACT EXTRACTION W/ INTRAOCULAR LENS IMPLANT Left    Archie Endo 06/23/2010  . CHOLECYSTECTOMY    . COLONOSCOPY N/A 07/15/2014   Procedure: COLONOSCOPY;  Surgeon: Rogene Houston, MD;  [facility];  Service: Endoscopy;  Laterality: N/A;  200 - moved to 2:10 - Ann to notify pt  . COMPRESSION HIP SCREW Right 08/01/2015   Procedure: COMPRESSION HIP;  Surgeon: Carole Civil, MD;  Location: [facility];  Service: Orthopedics;  Laterality: Right;  . ESOPHAGOGASTRODUODENOSCOPY N/A 07/15/2014   Procedure: ESOPHAGOGASTRODUODENOSCOPY (EGD);  Surgeon: Rogene Houston, MD;  [facility];  Service: Endoscopy;  Laterality: N/A;  . EYE SURGERY     cataract /lens both eyes  . FRACTURE SURGERY    .  INCONTINENCE SURGERY  12/2000   Tension-free transvaginal tape procedure/notes 07/04/2010  . JOINT REPLACEMENT    . LUMBAR FUSION  08/2006   Archie Endo 06/23/2010  . PERIPHERAL VASCULAR BALLOON ANGIOPLASTY  02/2013   Aortic arch angiography revealed 70% innominate/R subclavian stenosis along with 60% left common carotid ostial stenosis and moderate left subclavian artery stenosis. ->  Innominate artery PTA with 7 mm balloon reducing stenosis to roughly 30%.  Marland Kitchen PERIPHERAL VASCULAR CATHETERIZATION N/A 10/08/2014   Procedure: Visceral Angiography;  Surgeon: Algernon Huxley, MD;  Location: [facility];  Service: Cardiovascular;  -  long stenosis/occlusion of SMA. 75% celiac artery (PTA with 6 mm balloon)  . PERIPHERAL VASCULAR CATHETERIZATION N/A 10/08/2014   Procedure: Visceral Artery Intervention;  Surgeon: Algernon Huxley, MD;  Location: [facility];  Service: Cardiovascular;: 6 mm PTA balloon to stenosed CELIAC ARTERY  . ROTATOR CUFF REPAIR Left X 3  . TOTAL KNEE ARTHROPLASTY Bilateral 2005; 2011   right; left  . TOTAL SHOULDER ARTHROPLASTY Left 11/15/2016  . TOTAL SHOULDER ARTHROPLASTY Left 11/15/2016   Procedure: LEFT TOTAL SHOULDER ARTHROPLASTY;  Surgeon: [name], MD;  Location: [facility];  Service: Orthopedics;  Laterality: Left;  . TRANSTHORACIC ECHOCARDIOGRAM  10/2016   Normal LV wall thickness with LVEF 60-65% and grade 1 diastolic dysfunction.  Aortic valve calcific sclerosis with no stenosis.  Mildly calcified mitral annulus and leaflets.   Marland Kitchen VAGINAL HYSTERECTOMY  12/2000   Archie Endo 07/04/2010    There were no vitals filed for this visit.  Subjective Assessment - 07/10/17 1312    Subjective  Patient stated that she is having mid back pain which she rated as a 3/10.     Patient Stated Goals  To get more motion in her left shoulder and to be able to walk longer    Currently in Pain?  Yes    Pain Score  3     Pain Location  Back    Pain Orientation  Mid    Pain Descriptors / Indicators  Aching    Pain Type  Chronic pain    Pain Onset  More than a month ago    Pain Frequency  Intermittent    Multiple Pain Sites  No                       [facility] Adult PT Treatment/Exercise - 07/10/17 0001      Knee/Hip Exercises: Standing   Forward Lunges  Right;Left;1 set;15 reps 4 inch step    Other Standing Knee Exercises  STS from standard chair height, cueing for eccentric control 2x 10       Shoulder Exercises: Seated   Other Seated Exercises  table slides for increased shoulder flexion while sitting on stool 10x for 10-15" holds      Shoulder Exercises: Standing   Extension   Strengthening;20 reps;Theraband;Limitations    Theraband Level (Shoulder Extension)  Level 2 (Red)    Row  Strengthening;Both;AROM;20 reps;Other (comment);Limitations    Theraband Level (Shoulder Row)  Level 2 (Red)    Retraction  Strengthening;AROM;Both;20 reps;Other (comment);Limitations    Theraband Level (Shoulder Retraction)  Level 2 (Red)      Shoulder Exercises: ROM/Strengthening   Other ROM/Strengthening Exercises  wall walk 5x each (Lt 11 on peg board and Rt 12 peg board)      Manual Therapy   Manual Therapy  Passive ROM;Manual Traction;Joint mobilization    Manual therapy comments  Manual complete  separate than rest of tx    Joint Mobilization  Patiet supine. Posterior, anterior, and inferior glides to left glenohumeral joint grade III for improved mobility and decreased pain    Passive ROM  Patient supine. Left glenohumeral joint passive ROM in all directions.     Manual Traction  Left glenohumeral joint distraction 5 x 15-20 seconds for improved mobility             PT Education - 07/10/17 1624    Education provided  Yes    Education Details  Patient was educated on purpose and technique of exercises and provided additional HEP exercise.     Person(s) Educated  Patient    Methods  Explanation;Handout    Comprehension  Verbalized understanding;Returned demonstration;Verbal cues required;Tactile cues required;Need further instruction       PT Short Term Goals - 07/05/17 1124      PT SHORT TERM GOAL #1   Title  Patient will demonstrate understanding and report regular compliance with home exercise program to improve strength, range of motion, and decrease pain.     Baseline  5/16: reports compliance with HEP at least 1x/day    Time  4    Period  Weeks    Status  Achieved      PT SHORT TERM GOAL #2   Title  Patient will demonstrate improvement of 1/2 Grade MMT strength in all musculature tested as deficient at evaluation to assist patient with lifting activities,  functional squatting, and other functional mobility.     Time  4    Period  Weeks    Status  New      PT SHORT TERM GOAL #3   Title  Patient will report no greater than a 5/10 pain in her left shoulder or back over the course of a 1 week period indicating better tolerance with daily activities.     Time  4    Period  Weeks    Status  Achieved      PT SHORT TERM GOAL #4   Title  Patient will demonstrate improvement of 10 degrees in left shoulder active range of motion in all movements measured as deficient at evaluation to assist patient with lifting activities, and patient will be able to perform external rotation of left shoulder to touch base of neck and internal rotation of left shoulder to reach T5 for increased ease of adjusting clothing.     Time  4    Period  Weeks    Status  New        PT Long Term Goals - 07/05/17 1126      PT LONG TERM GOAL #1   Title  Patient will demonstrate improvement of 1 Grade MMT strength in all musculature tested as deficient at evaluation to assist patient with lifting activities, functional squatting, and other functional mobility.     Time  8    Period  Weeks    Status  New      PT LONG TERM GOAL #2   Title  Patient will demonstrate improvement of 20 degrees in left shoulder active range of motion in all movements measured as deficient at evaluation to assist patient with lifting activities.     Time  8    Period  Weeks    Status  New      PT LONG TERM GOAL #3   Title  Patient will perform the 5 times sit to stand test in 13 seconds or less  indicating improved balance, decreased risk of falls, and overall improved functional mobility.     Time  8    Period  Weeks    Status  New      PT LONG TERM GOAL #4   Title  Patient will perform lifting 1 box with 5 pound weight to shoulder level without increase of shoulder pain indicating improved ability to perform lifting in daily routine.     Time  8    Period  Weeks    Status  New      PT  LONG TERM GOAL #5   Title  Patient will report no greater than a 3/10 pain in either her left shoulder or back over the course of a 1 week period indicating improved tolerance to daily activities.     Baseline  5/16: "at times"    Time  8    Period  Weeks    Status  On-going            Plan - 07/10/17 1632    Clinical Impression Statement  This session focused on improving patient's left shoulder mobility, postural strengthening, and lower extremity strengthening. This session added forward lunges for lower extremity strengthening. This session finished with therapist providing manual therapy to improve left shoulder ROM and mobility and to decrease patient's pain level. Therapist performed PROM, joint mobilizations, and traction to patient's left glenohumeral joint to patient's tolerance. Plan to continue addressing both patient's strength and mobility deficits in her upper extremities and lower extremities to assist in decreasing patient's back pain.     Rehab Potential  Fair    Clinical Impairments Affecting Rehab Potential  Positive: Patient's motivation; Negative: comorbidities, chronicity of pain    PT Frequency  2x / week    PT Duration  8 weeks    PT Treatment/Interventions  ADLs/Self Care Home Management;Aquatic Therapy;Cryotherapy;Electrical Stimulation;Moist Heat;DME Instruction;Gait training;Stair training;Functional mobility training;Therapeutic activities;Therapeutic exercise;Balance training;Neuromuscular re-education;Patient/family education;Manual techniques;Passive range of motion;Dry needling;Energy conservation;Taping    PT Next Visit Plan  perform L GH joint mobs all directions for pain control and ROM improvements; continue manual distraction for pain control; Continue with AAROM for left shoulder; Lower extremity functional strengthening. Continue to progress with both shoulder, postural and lower extremity strengthening exercises.     PT Home Exercise Plan  06/07/17: Wall  walk left upper extremity 1 x 15 5 second holds 1x/day; Pec stretch 5 x 20 seconds 2x/day each upper extremity; Shoulder extension/rows/scapular retraction with RTB 2 x 10 bilateral upper extremities 1x/day, 06/21/17; decompression exercise; 07/10/17: Sit to stands from chair 2 x 10 3x/week    Consulted and Agree with Plan of Care  Patient       Patient will benefit from skilled therapeutic intervention in order to improve the following deficits and impairments:  Increased fascial restricitons, Improper body mechanics, Pain, Decreased mobility, Postural dysfunction, Decreased activity tolerance, Decreased endurance, Decreased range of motion, Decreased strength, Hypomobility, Impaired UE functional use, Decreased balance, Difficulty walking  Visit Diagnosis: Left shoulder pain, unspecified chronicity  Pain in thoracic spine  Other symptoms and signs involving the musculoskeletal system  Stiffness of left shoulder, not elsewhere classified  Muscle weakness (generalized)     Problem List Patient Active Problem List   Diagnosis Date Noted  . Acute colitis 12/02/2016  . Uncontrolled hypertension 12/02/2016  . Enteritis 12/02/2016  . Hyponatremia 12/02/2016  . Status post total shoulder arthroplasty, left 11/21/2016  . COPD (chronic obstructive pulmonary disease) ([place]) 11/21/2016  .  Weakness 11/19/2016  . DJD of left shoulder 11/15/2016  . DOE (dyspnea on exertion) 11/11/2016  . Rotator cuff arthropathy of left shoulder 10/30/2016  . Intertrochanteric fracture of right hip ([place]) 08/01/2015    Class: Acute  . SMA stenosis ([place]) 09/15/2014  . Abdominal pain, right upper quadrant 09/15/2014  . Essential hypertension 09/15/2014  . Depression 09/15/2014  . Hypothyroid 09/15/2014  . GERD without esophagitis 09/15/2014  . Lumbago 11/12/2013  . Stiffness of joint, not elsewhere classified, pelvic region and thigh 11/12/2013  . Muscle weakness (generalized) 11/12/2013  . Lack of  coordination 03/21/2013  . Fine motor impairment 03/21/2013  . Left leg weakness 03/17/2013  . Risk for falls 03/17/2013  . Carotid artery disease ([place]) 03/11/2013  . Subclavian artery stenosis ([place]) 03/11/2013  . TIA (transient ischemic attack) 03/11/2013  . Tobacco use disorder 03/11/2013  . CVA (cerebral infarction) 03/05/2013  . Pain in joint, shoulder region 11/01/2010   Clarene Critchley PT, DPT 4:38 PM, 07/10/17 [place], [place], [address] Phone: [phone]   Fax:  [phone]  Name: [name] MRN: [id number] Date of Birth: 1935-09-18

## 2017-07-12 ENCOUNTER — Ambulatory Visit (HOSPITAL_COMMUNITY): Payer: PPO | Admitting: Physical Therapy

## 2017-07-12 ENCOUNTER — Other Ambulatory Visit: Payer: Self-pay | Admitting: Licensed Clinical Social Worker

## 2017-07-12 ENCOUNTER — Encounter (HOSPITAL_COMMUNITY): Payer: Self-pay | Admitting: Physical Therapy

## 2017-07-12 DIAGNOSIS — M546 Pain in thoracic spine: Secondary | ICD-10-CM

## 2017-07-12 DIAGNOSIS — M25512 Pain in left shoulder: Secondary | ICD-10-CM | POA: Diagnosis not present

## 2017-07-12 DIAGNOSIS — R29898 Other symptoms and signs involving the musculoskeletal system: Secondary | ICD-10-CM

## 2017-07-12 DIAGNOSIS — M25612 Stiffness of left shoulder, not elsewhere classified: Secondary | ICD-10-CM

## 2017-07-12 DIAGNOSIS — M6281 Muscle weakness (generalized): Secondary | ICD-10-CM

## 2017-07-12 NOTE — Patient Outreach (Signed)
Assessment:  CSW spoke via phone with client. CSW verified client identity. CSW received verbal permission   from  client on 07/12/17 for CSW to speak with client about client needs. Client lives alone at home. She receives some daily support from her son who lives nearby. Client has received in home support in the past from [place]. Client has completed in home health services with [facility]. Client has not had any recent falls. Client eats several light meals daily. She is not having pain at present. CSW spoke with client about client care plan. CSW encouraged that client or her son Katoya Amato communicate with CSW in next 30 days to discuss community resources of help to client. CSW spoke with client about [employer] analyst. CSW spoke with client about [facility] food pantry. CSW spoke with client about [facility] and transport help through that agency.  CSW informed client of [facility] program in  nursing, social work and pharmacy. CSW invited client or Amarria Andreasen to call CSW at [phone] as needed to discuss social work needs of client.  Plan:  Client or Ashia Dehner to communicate with CSW in next 30 days to discuss community resources of help to client at this time.  CSW to call client or Dorene Bruni in 3 weeks to assess client needs.  Norva Riffle.Aoki Wedemeyer MSW, LCSW Licensed Clinical Social Worker [facility] Care Management [phone]          Plan:

## 2017-07-12 NOTE — Therapy (Signed)
[place], [place], [address] Phone: [phone]   Fax:  [phone]  Physical Therapy Treatment  Patient Details  Name: [name] MRN: [id number] Date of Birth: Nov 20, 1935 Referring Provider: Edmonia Lynch, MD   Encounter Date: 07/12/2017  PT End of Session - 07/12/17 1343    Visit Number  10    Number of Visits  17    Date for PT Re-Evaluation  07/31/17 Minireassess completed 07/05/17    Authorization Type  [employer] (No auth required)    Authorization Time Period  06/05/17 - 07/31/17    Authorization - Visit Number  2    Authorization - Number of Visits  10    PT Start Time  1300    PT Stop Time  1339    PT Time Calculation (min)  39 min    Activity Tolerance  Patient tolerated treatment well;Patient limited by fatigue;No increased pain    Behavior During Therapy  WFL for tasks assessed/performed       Past Medical History:  Diagnosis Date  . Anxiety   . Arthritis   . Carotid artery disease ([place]) 2007   R CEA; dopplers 10/2016:  R ICA moderate (non-obstructive plaque) ; L ICA ~50-69%, but may underestimate. (reassess next with either  CTA versus formal cerebral angiogram)   . Constipation due to pain medication   . COPD (chronic obstructive pulmonary disease) ([place])   . Depression   . Diverticulosis   . Fibromyalgia   . Gastritis   . GERD (gastroesophageal reflux disease)   . Head injury   . History of pneumonia   . History of stroke 75/1025   complication of R SubClav A PTA -- R hemispheric CVA  . Hypertension   . Hypothyroidism   . Osteoarthritis   . Peripheral vascular disease ([place])    Bilateral subclavian artery disease (status post R SubClav A PTA).  R side CEA (with US findings of Mod-Severe L CIA stenosis).  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   . Urgency incontinence     Past Surgical History:  Procedure Laterality Date  . ANTERIOR AND POSTERIOR  REPAIR  12/2000   Archie Endo 07/04/2010  . ANTERIOR CERVICAL DECOMP/DISCECTOMY FUSION N/A 04/03/2012   Procedure: ANTERIOR CERVICAL DECOMPRESSION/DISCECTOMY FUSION 2 LEVELS;  Surgeon: Hosie Spangle, MD;  Location: [facility];  Service: Neurosurgery;  Laterality: N/A;  Cervical four-five,Cervical five-six anterior cervical decompression with fusion plating and bonegraft  . BACK SURGERY    . BREAST SURGERY Right 02/2007   Archie Endo 06/23/2010  . BUNIONECTOMY WITH HAMMERTOE RECONSTRUCTION Left 09/10/2007   Archie Endo 06/23/2010  . CAROTID ENDARTERECTOMY Right 2007   [employer], Dr. Lucky Cowboy  . CATARACT EXTRACTION W/ INTRAOCULAR LENS IMPLANT Left    Archie Endo 06/23/2010  . CHOLECYSTECTOMY    . COLONOSCOPY N/A 07/15/2014   Procedure: COLONOSCOPY;  Surgeon: Rogene Houston, MD;  [facility];  Service: Endoscopy;  Laterality: N/A;  200 - moved to 2:10 - Ann to notify pt  . COMPRESSION HIP SCREW Right 08/01/2015   Procedure: COMPRESSION HIP;  Surgeon: Carole Civil, MD;  Location: [facility];  Service: Orthopedics;  Laterality: Right;  . ESOPHAGOGASTRODUODENOSCOPY N/A 07/15/2014   Procedure: ESOPHAGOGASTRODUODENOSCOPY (EGD);  Surgeon: Rogene Houston, MD;  [facility];  Service: Endoscopy;  Laterality: N/A;  . EYE SURGERY     cataract /lens both eyes  . FRACTURE SURGERY    .  INCONTINENCE SURGERY  12/2000   Tension-free transvaginal tape procedure/notes 07/04/2010  . JOINT REPLACEMENT    . LUMBAR FUSION  08/2006   Archie Endo 06/23/2010  . PERIPHERAL VASCULAR BALLOON ANGIOPLASTY  02/2013   Aortic arch angiography revealed 70% innominate/R subclavian stenosis along with 60% left common carotid ostial stenosis and moderate left subclavian artery stenosis. ->  Innominate artery PTA with 7 mm balloon reducing stenosis to roughly 30%.  Marland Kitchen PERIPHERAL VASCULAR CATHETERIZATION N/A 10/08/2014   Procedure: Visceral Angiography;  Surgeon: Algernon Huxley, MD;  Location: [facility];  Service: Cardiovascular;  -  long stenosis/occlusion of SMA. 75% celiac artery (PTA with 6 mm balloon)  . PERIPHERAL VASCULAR CATHETERIZATION N/A 10/08/2014   Procedure: Visceral Artery Intervention;  Surgeon: Algernon Huxley, MD;  Location: [facility];  Service: Cardiovascular;: 6 mm PTA balloon to stenosed CELIAC ARTERY  . ROTATOR CUFF REPAIR Left X 3  . TOTAL KNEE ARTHROPLASTY Bilateral 2005; 2011   right; left  . TOTAL SHOULDER ARTHROPLASTY Left 11/15/2016  . TOTAL SHOULDER ARTHROPLASTY Left 11/15/2016   Procedure: LEFT TOTAL SHOULDER ARTHROPLASTY;  Surgeon: [name], MD;  Location: [facility];  Service: Orthopedics;  Laterality: Left;  . TRANSTHORACIC ECHOCARDIOGRAM  10/2016   Normal LV wall thickness with LVEF 60-65% and grade 1 diastolic dysfunction.  Aortic valve calcific sclerosis with no stenosis.  Mildly calcified mitral annulus and leaflets.   Marland Kitchen VAGINAL HYSTERECTOMY  12/2000   Archie Endo 07/04/2010    There were no vitals filed for this visit.  Subjective Assessment - 07/12/17 1302    Subjective  Patient denied any pain this session. Just reported some lower extremity soreness.     Patient Stated Goals  To get more motion in her left shoulder and to be able to walk longer    Currently in Pain?  No/denies                       [facility] Adult PT Treatment/Exercise - 07/12/17 0001      Knee/Hip Exercises: Standing   Other Standing Knee Exercises  STS from standard chair height, cueing for eccentric control 2x 10       Shoulder Exercises: Seated   Other Seated Exercises  table slides for increased shoulder flexion and abduction left upper extremity while sitting on stool 15x for 10-15" holds      Shoulder Exercises: Standing   Extension  Strengthening;20 reps;Theraband;Limitations    Theraband Level (Shoulder Extension)  Level 2 (Red)    Row  Strengthening;Both;AROM;20 reps;Other (comment);Limitations    Theraband Level (Shoulder Row)  Level 2 (Red)    Retraction   Strengthening;AROM;Both;20 reps;Other (comment);Limitations    Theraband Level (Shoulder Retraction)  Level 2 (Red)    Other Standing Exercises  Palloff press with red theraband with partial tandem stance x 20 repetitions each LE forward    Other Standing Exercises  Snow angels through partial ROM       Shoulder Exercises: ROM/Strengthening   Other ROM/Strengthening Exercises  wall walk 5x each (Lt 11 on peg board and Rt 13 peg board)      Shoulder Exercises: Stretch   Other Shoulder Stretches  On half foam roll supine. Pec stretch and stretch into external shoulder rotation 2 x 30 seconds      Manual Therapy   Manual Therapy  Passive ROM;Joint mobilization;Manual Traction    Manual therapy comments  Manual complete separate than rest of tx    Joint  Mobilization  Patiet supine. Posterior, anterior, and inferior glides to left glenohumeral joint grade III for improved mobility and decreased pain    Passive ROM  Patient supine. Left glenohumeral joint passive ROM in all directions.     Manual Traction  Left glenohumeral joint distraction 5 x 15-20 seconds for improved mobility             PT Education - 07/12/17 1343    Education provided  Yes    Education Details  Patient was educated on purpose and technique of exercises throughout session.     Person(s) Educated  Patient    Methods  Explanation;Demonstration;Tactile cues;Verbal cues    Comprehension  Verbalized understanding;Tactile cues required;Returned demonstration;Verbal cues required       PT Short Term Goals - 07/05/17 1124      PT SHORT TERM GOAL #1   Title  Patient will demonstrate understanding and report regular compliance with home exercise program to improve strength, range of motion, and decrease pain.     Baseline  5/16: reports compliance with HEP at least 1x/day    Time  4    Period  Weeks    Status  Achieved      PT SHORT TERM GOAL #2   Title  Patient will demonstrate improvement of 1/2 Grade MMT  strength in all musculature tested as deficient at evaluation to assist patient with lifting activities, functional squatting, and other functional mobility.     Time  4    Period  Weeks    Status  New      PT SHORT TERM GOAL #3   Title  Patient will report no greater than a 5/10 pain in her left shoulder or back over the course of a 1 week period indicating better tolerance with daily activities.     Time  4    Period  Weeks    Status  Achieved      PT SHORT TERM GOAL #4   Title  Patient will demonstrate improvement of 10 degrees in left shoulder active range of motion in all movements measured as deficient at evaluation to assist patient with lifting activities, and patient will be able to perform external rotation of left shoulder to touch base of neck and internal rotation of left shoulder to reach T5 for increased ease of adjusting clothing.     Time  4    Period  Weeks    Status  New        PT Long Term Goals - 07/05/17 1126      PT LONG TERM GOAL #1   Title  Patient will demonstrate improvement of 1 Grade MMT strength in all musculature tested as deficient at evaluation to assist patient with lifting activities, functional squatting, and other functional mobility.     Time  8    Period  Weeks    Status  New      PT LONG TERM GOAL #2   Title  Patient will demonstrate improvement of 20 degrees in left shoulder active range of motion in all movements measured as deficient at evaluation to assist patient with lifting activities.     Time  8    Period  Weeks    Status  New      PT LONG TERM GOAL #3   Title  Patient will perform the 5 times sit to stand test in 13 seconds or less indicating improved balance, decreased risk of falls, and overall improved functional mobility.  Time  8    Period  Weeks    Status  New      PT LONG TERM GOAL #4   Title  Patient will perform lifting 1 box with 5 pound weight to shoulder level without increase of shoulder pain indicating  improved ability to perform lifting in daily routine.     Time  8    Period  Weeks    Status  New      PT LONG TERM GOAL #5   Title  Patient will report no greater than a 3/10 pain in either her left shoulder or back over the course of a 1 week period indicating improved tolerance to daily activities.     Baseline  5/16: "at times"    Time  8    Period  Weeks    Status  On-going            Plan - 07/12/17 1344    Clinical Impression Statement  This session patient reported soreness in her lower extremities from last session so modified the lower extremity strengthening exercises to patient's tolerance. This session added snow angels on the wall to improve patient's bilateral upper extremity ROM and also added Palloff press to improve patient's trunk strength. This session patient was able to reach peg number 13 with wall walks, but remained at peg 11 on the left upper extremity. This session ended with manual therapy to improve patient's left shoulder mobility to patient's tolerance.     Rehab Potential  Fair    Clinical Impairments Affecting Rehab Potential  Positive: Patient's motivation; Negative: comorbidities, chronicity of pain    PT Frequency  2x / week    PT Duration  8 weeks    PT Treatment/Interventions  ADLs/Self Care Home Management;Aquatic Therapy;Cryotherapy;Electrical Stimulation;Moist Heat;DME Instruction;Gait training;Stair training;Functional mobility training;Therapeutic activities;Therapeutic exercise;Balance training;Neuromuscular re-education;Patient/family education;Manual techniques;Passive range of motion;Dry needling;Energy conservation;Taping    PT Next Visit Plan  perform L GH joint mobs all directions for pain control and ROM improvements; continue manual distraction for pain control; Continue with AAROM for left shoulder; Lower extremity functional strengthening. Continue to progress with both shoulder, postural and lower extremity strengthening exercises.      PT Home Exercise Plan  06/07/17: Wall walk left upper extremity 1 x 15 5 second holds 1x/day; Pec stretch 5 x 20 seconds 2x/day each upper extremity; Shoulder extension/rows/scapular retraction with RTB 2 x 10 bilateral upper extremities 1x/day, 06/21/17; decompression exercise; 07/10/17: Sit to stands from chair 2 x 10 3x/week    Consulted and Agree with Plan of Care  Patient       Patient will benefit from skilled therapeutic intervention in order to improve the following deficits and impairments:  Increased fascial restricitons, Improper body mechanics, Pain, Decreased mobility, Postural dysfunction, Decreased activity tolerance, Decreased endurance, Decreased range of motion, Decreased strength, Hypomobility, Impaired UE functional use, Decreased balance, Difficulty walking  Visit Diagnosis: Left shoulder pain, unspecified chronicity  Pain in thoracic spine  Other symptoms and signs involving the musculoskeletal system  Stiffness of left shoulder, not elsewhere classified  Muscle weakness (generalized)     Problem List Patient Active Problem List   Diagnosis Date Noted  . Acute colitis 12/02/2016  . Uncontrolled hypertension 12/02/2016  . Enteritis 12/02/2016  . Hyponatremia 12/02/2016  . Status post total shoulder arthroplasty, left 11/21/2016  . COPD (chronic obstructive pulmonary disease) ([place]) 11/21/2016  . Weakness 11/19/2016  . DJD of left shoulder 11/15/2016  . DOE (dyspnea on exertion)  11/11/2016  . Rotator cuff arthropathy of left shoulder 10/30/2016  . Intertrochanteric fracture of right hip ([place]) 08/01/2015    Class: Acute  . SMA stenosis ([place]) 09/15/2014  . Abdominal pain, right upper quadrant 09/15/2014  . Essential hypertension 09/15/2014  . Depression 09/15/2014  . Hypothyroid 09/15/2014  . GERD without esophagitis 09/15/2014  . Lumbago 11/12/2013  . Stiffness of joint, not elsewhere classified, pelvic region and thigh 11/12/2013  . Muscle weakness  (generalized) 11/12/2013  . Lack of coordination 03/21/2013  . Fine motor impairment 03/21/2013  . Left leg weakness 03/17/2013  . Risk for falls 03/17/2013  . Carotid artery disease ([place]) 03/11/2013  . Subclavian artery stenosis ([place]) 03/11/2013  . TIA (transient ischemic attack) 03/11/2013  . Tobacco use disorder 03/11/2013  . CVA (cerebral infarction) 03/05/2013  . Pain in joint, shoulder region 11/01/2010   Clarene Critchley PT, DPT 1:45 PM, 07/12/17 [place], [place], [address] Phone: [phone]   Fax:  [phone]  Name: [name] MRN: [id number] Date of Birth: 1936-02-15

## 2017-07-17 ENCOUNTER — Ambulatory Visit (HOSPITAL_COMMUNITY): Payer: PPO | Admitting: Physical Therapy

## 2017-07-17 ENCOUNTER — Encounter (HOSPITAL_COMMUNITY): Payer: Self-pay | Admitting: Physical Therapy

## 2017-07-17 DIAGNOSIS — M25512 Pain in left shoulder: Secondary | ICD-10-CM

## 2017-07-17 DIAGNOSIS — R29898 Other symptoms and signs involving the musculoskeletal system: Secondary | ICD-10-CM

## 2017-07-17 DIAGNOSIS — M546 Pain in thoracic spine: Secondary | ICD-10-CM

## 2017-07-17 DIAGNOSIS — M25612 Stiffness of left shoulder, not elsewhere classified: Secondary | ICD-10-CM

## 2017-07-17 DIAGNOSIS — M6281 Muscle weakness (generalized): Secondary | ICD-10-CM

## 2017-07-17 NOTE — Therapy (Signed)
[place], [place], [address] Phone: [phone]   Fax:  [phone]  Physical Therapy Treatment  Patient Details  Name: [name] MRN: [id number] Date of Birth: 25-Nov-1935 Referring Provider: Edmonia Lynch, MD   Encounter Date: 07/17/2017  PT End of Session - 07/17/17 1528    Visit Number  11    Number of Visits  17    Date for PT Re-Evaluation  07/31/17 Minireassess completed 07/05/17    Authorization Type  [employer] (No auth required)    Authorization Time Period  06/05/17 - 07/31/17    Authorization - Visit Number  3    Authorization - Number of Visits  10    PT Start Time  1300    PT Stop Time  1344    PT Time Calculation (min)  44 min    Activity Tolerance  Patient tolerated treatment well;Patient limited by fatigue;No increased pain    Behavior During Therapy  WFL for tasks assessed/performed       Past Medical History:  Diagnosis Date  . Anxiety   . Arthritis   . Carotid artery disease ([place]) 2007   R CEA; dopplers 10/2016:  R ICA moderate (non-obstructive plaque) ; L ICA ~50-69%, but may underestimate. (reassess next with either  CTA versus formal cerebral angiogram)   . Constipation due to pain medication   . COPD (chronic obstructive pulmonary disease) ([place])   . Depression   . Diverticulosis   . Fibromyalgia   . Gastritis   . GERD (gastroesophageal reflux disease)   . Head injury   . History of pneumonia   . History of stroke [id number]/9892   complication of R SubClav A PTA -- R hemispheric CVA  . Hypertension   . Hypothyroidism   . Osteoarthritis   . Peripheral vascular disease ([place])    Bilateral subclavian artery disease (status post R SubClav A PTA).  R side CEA (with US findings of Mod-Severe L CIA stenosis).  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   . Urgency incontinence     Past Surgical History:  Procedure Laterality Date  . ANTERIOR AND POSTERIOR  REPAIR  12/2000   Archie Endo 07/04/2010  . ANTERIOR CERVICAL DECOMP/DISCECTOMY FUSION N/A 04/03/2012   Procedure: ANTERIOR CERVICAL DECOMPRESSION/DISCECTOMY FUSION 2 LEVELS;  Surgeon: Hosie Spangle, MD;  Location: [facility];  Service: Neurosurgery;  Laterality: N/A;  Cervical four-five,Cervical five-six anterior cervical decompression with fusion plating and bonegraft  . BACK SURGERY    . BREAST SURGERY Right 02/2007   Archie Endo 06/23/2010  . BUNIONECTOMY WITH HAMMERTOE RECONSTRUCTION Left 09/10/2007   Archie Endo 06/23/2010  . CAROTID ENDARTERECTOMY Right 2007   [facility], Dr. Lucky Cowboy  . CATARACT EXTRACTION W/ INTRAOCULAR LENS IMPLANT Left    Archie Endo 06/23/2010  . CHOLECYSTECTOMY    . COLONOSCOPY N/A 07/15/2014   Procedure: COLONOSCOPY;  Surgeon: Rogene Houston, MD;  [facility];  Service: Endoscopy;  Laterality: N/A;  200 - moved to 2:10 - Ann to notify pt  . COMPRESSION HIP SCREW Right 08/01/2015   Procedure: COMPRESSION HIP;  Surgeon: Carole Civil, MD;  Location: [facility];  Service: Orthopedics;  Laterality: Right;  . ESOPHAGOGASTRODUODENOSCOPY N/A 07/15/2014   Procedure: ESOPHAGOGASTRODUODENOSCOPY (EGD);  Surgeon: Rogene Houston, MD;  [facility];  Service: Endoscopy;  Laterality: N/A;  . EYE SURGERY     cataract /lens both eyes  . FRACTURE SURGERY    .  INCONTINENCE SURGERY  12/2000   Tension-free transvaginal tape procedure/notes 07/04/2010  . JOINT REPLACEMENT    . LUMBAR FUSION  08/2006   Archie Endo 06/23/2010  . PERIPHERAL VASCULAR BALLOON ANGIOPLASTY  02/2013   Aortic arch angiography revealed 70% innominate/R subclavian stenosis along with 60% left common carotid ostial stenosis and moderate left subclavian artery stenosis. ->  Innominate artery PTA with 7 mm balloon reducing stenosis to roughly 30%.  Marland Kitchen PERIPHERAL VASCULAR CATHETERIZATION N/A 10/08/2014   Procedure: Visceral Angiography;  Surgeon: Algernon Huxley, MD;  Location: [facility];  Service: Cardiovascular;  -  long stenosis/occlusion of SMA. 75% celiac artery (PTA with 6 mm balloon)  . PERIPHERAL VASCULAR CATHETERIZATION N/A 10/08/2014   Procedure: Visceral Artery Intervention;  Surgeon: Algernon Huxley, MD;  Location: [facility];  Service: Cardiovascular;: 6 mm PTA balloon to stenosed CELIAC ARTERY  . ROTATOR CUFF REPAIR Left X 3  . TOTAL KNEE ARTHROPLASTY Bilateral 2005; 2011   right; left  . TOTAL SHOULDER ARTHROPLASTY Left 11/15/2016  . TOTAL SHOULDER ARTHROPLASTY Left 11/15/2016   Procedure: LEFT TOTAL SHOULDER ARTHROPLASTY;  Surgeon: [name], MD;  Location: [facility];  Service: Orthopedics;  Laterality: Left;  . TRANSTHORACIC ECHOCARDIOGRAM  10/2016   Normal LV wall thickness with LVEF 60-65% and grade 1 diastolic dysfunction.  Aortic valve calcific sclerosis with no stenosis.  Mildly calcified mitral annulus and leaflets.   Marland Kitchen VAGINAL HYSTERECTOMY  12/2000   Archie Endo 07/04/2010    There were no vitals filed for this visit.  Subjective Assessment - 07/17/17 1302    Subjective  Patient denied any pain this session. Patient stated she has been doing her exercises at home.     Patient Stated Goals  To get more motion in her left shoulder and to be able to walk longer    Currently in Pain?  No/denies                       [facility] Adult PT Treatment/Exercise - 07/17/17 0001      Knee/Hip Exercises: Standing   Other Standing Knee Exercises  Sidestepping with red theraband 14 feet x 2 roundtrips      Shoulder Exercises: Seated   Other Seated Exercises  table slides for increased shoulder flexion and abduction left and right upper extremity while sitting on stool 15x for 10-15" holds      Shoulder Exercises: Standing   Other Standing Exercises  Palloff press with red theraband with partial tandem stance x 20 repetitions each LE forward    Other Standing Exercises  Snow angels through partial ROM x 15 bilateral upper extremities      Shoulder Exercises: ROM/Strengthening    Other ROM/Strengthening Exercises  wall walk 5x each (Lt 11 on peg board and Rt 13 peg board)      Shoulder Exercises: Isometric Strengthening   Other Isometric Exercises  Shoulder external and internal rotation isometrics 10 x 10 second holds, bilateral shoulder      Manual Therapy   Manual Therapy  Passive ROM;Joint mobilization;Manual Traction    Manual therapy comments  Manual complete separate than rest of tx    Joint Mobilization  Patiet supine. Posterior, anterior, and inferior glides to left glenohumeral joint grade III for improved mobility and decreased pain    Passive ROM  Patient supine. Left glenohumeral joint passive ROM in all directions.     Manual Traction  Left glenohumeral joint distraction 5 x 15-20 seconds  for improved mobility             PT Education - 07/17/17 1523    Education provided  Yes    Education Details  Patient was educated on purpose and technique of exercises throughout session.     Person(s) Educated  Patient    Methods  Explanation;Tactile cues;Verbal cues    Comprehension  Verbalized understanding;Returned demonstration;Need further instruction       PT Short Term Goals - 07/05/17 1124      PT SHORT TERM GOAL #1   Title  Patient will demonstrate understanding and report regular compliance with home exercise program to improve strength, range of motion, and decrease pain.     Baseline  5/16: reports compliance with HEP at least 1x/day    Time  4    Period  Weeks    Status  Achieved      PT SHORT TERM GOAL #2   Title  Patient will demonstrate improvement of 1/2 Grade MMT strength in all musculature tested as deficient at evaluation to assist patient with lifting activities, functional squatting, and other functional mobility.     Time  4    Period  Weeks    Status  New      PT SHORT TERM GOAL #3   Title  Patient will report no greater than a 5/10 pain in her left shoulder or back over the course of a 1 week period indicating better  tolerance with daily activities.     Time  4    Period  Weeks    Status  Achieved      PT SHORT TERM GOAL #4   Title  Patient will demonstrate improvement of 10 degrees in left shoulder active range of motion in all movements measured as deficient at evaluation to assist patient with lifting activities, and patient will be able to perform external rotation of left shoulder to touch base of neck and internal rotation of left shoulder to reach T5 for increased ease of adjusting clothing.     Time  4    Period  Weeks    Status  New        PT Long Term Goals - 07/05/17 1126      PT LONG TERM GOAL #1   Title  Patient will demonstrate improvement of 1 Grade MMT strength in all musculature tested as deficient at evaluation to assist patient with lifting activities, functional squatting, and other functional mobility.     Time  8    Period  Weeks    Status  New      PT LONG TERM GOAL #2   Title  Patient will demonstrate improvement of 20 degrees in left shoulder active range of motion in all movements measured as deficient at evaluation to assist patient with lifting activities.     Time  8    Period  Weeks    Status  New      PT LONG TERM GOAL #3   Title  Patient will perform the 5 times sit to stand test in 13 seconds or less indicating improved balance, decreased risk of falls, and overall improved functional mobility.     Time  8    Period  Weeks    Status  New      PT LONG TERM GOAL #4   Title  Patient will perform lifting 1 box with 5 pound weight to shoulder level without increase of shoulder pain indicating improved ability  to perform lifting in daily routine.     Time  8    Period  Weeks    Status  New      PT LONG TERM GOAL #5   Title  Patient will report no greater than a 3/10 pain in either her left shoulder or back over the course of a 1 week period indicating improved tolerance to daily activities.     Baseline  5/16: "at times"    Time  8    Period  Weeks    Status   On-going            Plan - 07/17/17 1528    Clinical Impression Statement  This session continued with established plan of care. This session added isometric shoulder internal and external rotation strengthening which patient required both tactile and verbal cues in order to perform. This session patient also performed sidestepping with red theraband around bilateral thighs. With manual therapy continued to feel resistance in all directions of joint mobilizations. Patient was provided with updated HEP at this session including table slides for improved shoulder mobility.     Rehab Potential  Fair    Clinical Impairments Affecting Rehab Potential  Positive: Patient's motivation; Negative: comorbidities, chronicity of pain    PT Frequency  2x / week    PT Duration  8 weeks    PT Treatment/Interventions  ADLs/Self Care Home Management;Aquatic Therapy;Cryotherapy;Electrical Stimulation;Moist Heat;DME Instruction;Gait training;Stair training;Functional mobility training;Therapeutic activities;Therapeutic exercise;Balance training;Neuromuscular re-education;Patient/family education;Manual techniques;Passive range of motion;Dry needling;Energy conservation;Taping    PT Next Visit Plan  perform L GH joint mobs all directions for pain control and ROM improvements; continue manual distraction for pain control; Continue with AAROM for left shoulder; Lower extremity functional strengthening. Continue to progress with both shoulder, postural and lower extremity strengthening exercises.     PT Home Exercise Plan  06/07/17: Wall walk left upper extremity 1 x 15 5 second holds 1x/day; Pec stretch 5 x 20 seconds 2x/day each upper extremity; Shoulder extension/rows/scapular retraction with RTB 2 x 10 bilateral upper extremities 1x/day, 06/21/17; decompression exercise; 07/10/17: Sit to stands from chair 2 x 10 3x/week. 07/17/17: Table slides x 15 for 15 second holds each upper extremity 1 x/day    Consulted and Agree  with Plan of Care  Patient       Patient will benefit from skilled therapeutic intervention in order to improve the following deficits and impairments:  Increased fascial restricitons, Improper body mechanics, Pain, Decreased mobility, Postural dysfunction, Decreased activity tolerance, Decreased endurance, Decreased range of motion, Decreased strength, Hypomobility, Impaired UE functional use, Decreased balance, Difficulty walking  Visit Diagnosis: Left shoulder pain, unspecified chronicity  Pain in thoracic spine  Other symptoms and signs involving the musculoskeletal system  Stiffness of left shoulder, not elsewhere classified  Muscle weakness (generalized)     Problem List Patient Active Problem List   Diagnosis Date Noted  . Acute colitis 12/02/2016  . Uncontrolled hypertension 12/02/2016  . Enteritis 12/02/2016  . Hyponatremia 12/02/2016  . Status post total shoulder arthroplasty, left 11/21/2016  . COPD (chronic obstructive pulmonary disease) ([place]) 11/21/2016  . Weakness 11/19/2016  . DJD of left shoulder 11/15/2016  . DOE (dyspnea on exertion) 11/11/2016  . Rotator cuff arthropathy of left shoulder 10/30/2016  . Intertrochanteric fracture of right hip ([place]) 08/01/2015    Class: Acute  . SMA stenosis ([place]) 09/15/2014  . Abdominal pain, right upper quadrant 09/15/2014  . Essential hypertension 09/15/2014  . Depression 09/15/2014  . Hypothyroid  09/15/2014  . GERD without esophagitis 09/15/2014  . Lumbago 11/12/2013  . Stiffness of joint, not elsewhere classified, pelvic region and thigh 11/12/2013  . Muscle weakness (generalized) 11/12/2013  . Lack of coordination 03/21/2013  . Fine motor impairment 03/21/2013  . Left leg weakness 03/17/2013  . Risk for falls 03/17/2013  . Carotid artery disease ([place]) 03/11/2013  . Subclavian artery stenosis ([place]) 03/11/2013  . TIA (transient ischemic attack) 03/11/2013  . Tobacco use disorder 03/11/2013  . CVA (cerebral  infarction) 03/05/2013  . Pain in joint, shoulder region 11/01/2010   Clarene Critchley PT, DPT 3:33 PM, 07/17/17 [name], [place], [address] Phone: [phone]   Fax:  [phone]  Name: ADLINE KIRSHENBAUM MRN: [id number] Date of Birth: [birth year]

## 2017-07-17 NOTE — Patient Instructions (Signed)
  TABLE SLIDE - FLEXION Sitting in a chair, rest your injured arm on a table and gently slide it forward and then Back. Repeat 15 Times Hold 15 Seconds Complete 1 Set Perform 1 Time(s) a Day   TABLE SLIDE - ABDUCTION Sitting in a chair, rest your injured arm on a table and gently slide it out to the side and then back. Repeat 15 Times Hold 15 Seconds Complete 1 Set Perform 1 Time(s) a Day

## 2017-07-18 DIAGNOSIS — M19012 Primary osteoarthritis, left shoulder: Secondary | ICD-10-CM | POA: Diagnosis not present

## 2017-07-19 ENCOUNTER — Encounter (HOSPITAL_COMMUNITY): Payer: Self-pay | Admitting: Physical Therapy

## 2017-07-19 ENCOUNTER — Ambulatory Visit (HOSPITAL_COMMUNITY): Payer: PPO | Admitting: Physical Therapy

## 2017-07-19 DIAGNOSIS — M546 Pain in thoracic spine: Secondary | ICD-10-CM

## 2017-07-19 DIAGNOSIS — M25512 Pain in left shoulder: Secondary | ICD-10-CM

## 2017-07-19 DIAGNOSIS — R29898 Other symptoms and signs involving the musculoskeletal system: Secondary | ICD-10-CM

## 2017-07-19 DIAGNOSIS — M6281 Muscle weakness (generalized): Secondary | ICD-10-CM

## 2017-07-19 DIAGNOSIS — M25612 Stiffness of left shoulder, not elsewhere classified: Secondary | ICD-10-CM

## 2017-07-19 NOTE — Therapy (Signed)
[facility] [place], [place], [address] Phone: [phone]   Fax:  [phone]  Physical Therapy Treatment  Patient Details  Name: [name] MRN: [id number] Date of Birth: 27-Aug-1935 Referring Provider: Edmonia Lynch, MD   Encounter Date: 07/19/2017  PT End of Session - 07/19/17 1312    Visit Number  12    Number of Visits  17    Date for PT Re-Evaluation  07/31/17 Minireassess completed 07/05/17    Authorization Type  [employer] (No auth required)    Authorization Time Period  06/05/17 - 07/31/17    Authorization - Visit Number  3    Authorization - Number of Visits  10    PT Start Time  1301    PT Stop Time  1343    PT Time Calculation (min)  42 min    Activity Tolerance  Patient tolerated treatment well;Patient limited by fatigue;No increased pain    Behavior During Therapy  WFL for tasks assessed/performed       Past Medical History:  Diagnosis Date  . Anxiety   . Arthritis   . Carotid artery disease ([place]) 2007   R CEA; dopplers 10/2016:  R ICA moderate (non-obstructive plaque) ; L ICA ~50-69%, but may underestimate. (reassess next with either  CTA versus formal cerebral angiogram)   . Constipation due to pain medication   . COPD (chronic obstructive pulmonary disease) ([place])   . Depression   . Diverticulosis   . Fibromyalgia   . Gastritis   . GERD (gastroesophageal reflux disease)   . Head injury   . History of pneumonia   . History of stroke 43/3295   complication of R SubClav A PTA -- R hemispheric CVA  . Hypertension   . Hypothyroidism   . Osteoarthritis   . Peripheral vascular disease ([place])    Bilateral subclavian artery disease (status post R SubClav A PTA).  R side CEA (with US findings of Mod-Severe L CIA stenosis).  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   . Urgency incontinence     Past Surgical History:  Procedure Laterality Date  . ANTERIOR AND POSTERIOR  REPAIR  12/2000   Archie Endo 07/04/2010  . ANTERIOR CERVICAL DECOMP/DISCECTOMY FUSION N/A 04/03/2012   Procedure: ANTERIOR CERVICAL DECOMPRESSION/DISCECTOMY FUSION 2 LEVELS;  Surgeon: Hosie Spangle, MD;  Location: [facility];  Service: Neurosurgery;  Laterality: N/A;  Cervical four-five,Cervical five-six anterior cervical decompression with fusion plating and bonegraft  . BACK SURGERY    . BREAST SURGERY Right 02/2007   Archie Endo 06/23/2010  . BUNIONECTOMY WITH HAMMERTOE RECONSTRUCTION Left 09/10/2007   Ar[facility] Endo 06/23/2010  . CAROTID ENDARTERECTOMY Right 2007   [facility], Dr. Lucky Cowboy  . CATARACT EXTRACTION W/ INTRAOCULAR LENS IMPLANT Left    Ar[facility] Endo 06/23/2010  . CHOLECYSTECTOMY    . COLONOSCOPY N/A 07/15/2014   Procedure: COLONOSCOPY;  Surgeon: Rogene Houston, MD;  [facility];  Service: Endoscopy;  Laterality: N/A;  200 - moved to 2:10 - Ann to notify pt  . COMPRESSION HIP SCREW Right 08/01/2015   Procedure: COMPRESSION HIP;  Surgeon: Carole Civil, MD;  Location: [facility];  Service: Orthopedics;  Laterality: Right;  . ESOPHAGOGASTRODUODENOSCOPY N/A 07/15/2014   Procedure: ESOPHAGOGASTRODUODENOSCOPY (EGD);  Surgeon: Rogene Houston, MD;  [facility];  Service: Endoscopy;  Laterality: N/A;  . EYE SURGERY     cataract /lens both eyes  . FRACTURE SURGERY    .  INCONTINENCE SURGERY  12/2000   Tension-free transvaginal tape procedure/notes 07/04/2010  . JOINT REPLACEMENT    . LUMBAR FUSION  08/2006   Archie Endo 06/23/2010  . PERIPHERAL VASCULAR BALLOON ANGIOPLASTY  02/2013   Aortic arch angiography revealed 70% innominate/R subclavian stenosis along with 60% left common carotid ostial stenosis and moderate left subclavian artery stenosis. ->  Innominate artery PTA with 7 mm balloon reducing stenosis to roughly 30%.  Marland Kitchen PERIPHERAL VASCULAR CATHETERIZATION N/A 10/08/2014   Procedure: Visceral Angiography;  Surgeon: Algernon Huxley, MD;  Location: [facility];  Service: Cardiovascular;  -  long stenosis/occlusion of SMA. 75% celiac artery (PTA with 6 mm balloon)  . PERIPHERAL VASCULAR CATHETERIZATION N/A 10/08/2014   Procedure: Visceral Artery Intervention;  Surgeon: Algernon Huxley, MD;  Location: [facility];  Service: Cardiovascular;: 6 mm PTA balloon to stenosed CELIAC ARTERY  . ROTATOR CUFF REPAIR Left X 3  . TOTAL KNEE ARTHROPLASTY Bilateral 2005; 2011   right; left  . TOTAL SHOULDER ARTHROPLASTY Left 11/15/2016  . TOTAL SHOULDER ARTHROPLASTY Left 11/15/2016   Procedure: LEFT TOTAL SHOULDER ARTHROPLASTY;  Surgeon: [name], MD;  Location: [facility];  Service: Orthopedics;  Laterality: Left;  . TRANSTHORACIC ECHOCARDIOGRAM  10/2016   Normal LV wall thickness with LVEF 60-65% and grade 1 diastolic dysfunction.  Aortic valve calcific sclerosis with no stenosis.  Mildly calcified mitral annulus and leaflets.   Marland Kitchen VAGINAL HYSTERECTOMY  12/2000   Archie Endo 07/04/2010    There were no vitals filed for this visit.  Subjective Assessment - 07/19/17 1304    Subjective  Patient stated that she hasn't done her exercises recently at home. She denied any pain at start of session.     Patient Stated Goals  To get more motion in her left shoulder and to be able to walk longer    Currently in Pain?  No/denies                       [facility] Adult PT Treatment/Exercise - 07/19/17 0001      Lumbar Exercises: Standing   Other Standing Lumbar Exercises  Lumbar extension x 10 repetitions      Knee/Hip Exercises: Standing   Other Standing Knee Exercises  Sidestepping with red theraband 14 feet x 2 roundtrips      Shoulder Exercises: Sidelying   Other Sidelying Exercises  Thoracic openers x 15 each upper extremity to try and increase mobility in thoracic spine and shoulders      Shoulder Exercises: Standing   Other Standing Exercises  Shoulder adduction with red theraband 2 x 10 repetitions RTB. Palloff press with red theraband with partial tandem stance x 20 repetitions  each LE forward    Other Standing Exercises  Snow angels through partial ROM x 15 bilateral upper extremities      Shoulder Exercises: ROM/Strengthening   Other ROM/Strengthening Exercises  wall walk 10x each (Lt 11 on peg board and Rt 13 peg board)      Shoulder Exercises: Isometric Strengthening   Other Isometric Exercises  Shoulder external and internal rotation isometrics 10 x 10 second holds, bilateral shoulder      Manual Therapy   Manual Therapy  Passive ROM;Joint mobilization;Manual Traction    Manual therapy comments  Manual complete separate than rest of tx    Joint Mobilization  Patiet supine. Posterior, anterior, and inferior glides to left glenohumeral joint grade III for improved mobility and decreased pain  Passive ROM  Patient supine. Left glenohumeral joint passive ROM in all directions.     Manual Traction  Left glenohumeral joint distraction 5 x 15-20 seconds for improved mobility             PT Education - 07/19/17 1305    Education provided  Yes    Education Details  Patient was educated on purpose and technique of exercises throughout session.     Person(s) Educated  Patient    Methods  Explanation;Tactile cues;Verbal cues    Comprehension  Verbalized understanding;Returned demonstration       PT Short Term Goals - 07/05/17 1124      PT SHORT TERM GOAL #1   Title  Patient will demonstrate understanding and report regular compliance with home exercise program to improve strength, range of motion, and decrease pain.     Baseline  5/16: reports compliance with HEP at least 1x/day    Time  4    Period  Weeks    Status  Achieved      PT SHORT TERM GOAL #2   Title  Patient will demonstrate improvement of 1/2 Grade MMT strength in all musculature tested as deficient at evaluation to assist patient with lifting activities, functional squatting, and other functional mobility.     Time  4    Period  Weeks    Status  New      PT SHORT TERM GOAL #3   Title   Patient will report no greater than a 5/10 pain in her left shoulder or back over the course of a 1 week period indicating better tolerance with daily activities.     Time  4    Period  Weeks    Status  Achieved      PT SHORT TERM GOAL #4   Title  Patient will demonstrate improvement of 10 degrees in left shoulder active range of motion in all movements measured as deficient at evaluation to assist patient with lifting activities, and patient will be able to perform external rotation of left shoulder to touch base of neck and internal rotation of left shoulder to reach T5 for increased ease of adjusting clothing.     Time  4    Period  Weeks    Status  New        PT Long Term Goals - 07/05/17 1126      PT LONG TERM GOAL #1   Title  Patient will demonstrate improvement of 1 Grade MMT strength in all musculature tested as deficient at evaluation to assist patient with lifting activities, functional squatting, and other functional mobility.     Time  8    Period  Weeks    Status  New      PT LONG TERM GOAL #2   Title  Patient will demonstrate improvement of 20 degrees in left shoulder active range of motion in all movements measured as deficient at evaluation to assist patient with lifting activities.     Time  8    Period  Weeks    Status  New      PT LONG TERM GOAL #3   Title  Patient will perform the 5 times sit to stand test in 13 seconds or less indicating improved balance, decreased risk of falls, and overall improved functional mobility.     Time  8    Period  Weeks    Status  New      PT LONG TERM GOAL #  4   Title  Patient will perform lifting 1 box with 5 pound weight to shoulder level without increase of shoulder pain indicating improved ability to perform lifting in daily routine.     Time  8    Period  Weeks    Status  New      PT LONG TERM GOAL #5   Title  Patient will report no greater than a 3/10 pain in either her left shoulder or back over the course of a 1 week  period indicating improved tolerance to daily activities.     Baseline  5/16: "at times"    Time  8    Period  Weeks    Status  On-going            Plan - 07/19/17 1312    Clinical Impression Statement  This session continued to progress patient with exercises to improve ROM and strength of shoulders as well as lower extremity and trunk strengthening exercises. This session added shoulder adduction strengthening with red theraband. This session also added lumbar extension exercise to decrease back discomfort during session. Also, added sidelying thoracic openers this session to improve thoracic spine mobility and improve shoulder mobility. Patient required frequent verbal cues and tactile cues to perform exercises with proper form. Session ended with manual therapy in order to improve mobility and joint restrictions. Patient was provided education on posture throughout session. Patient would benefit from continued skilled physical therapy to continue progress toward goals.     Rehab Potential  Fair    Clinical Impairments Affecting Rehab Potential  Positive: Patient's motivation; Negative: comorbidities, chronicity of pain    PT Frequency  2x / week    PT Duration  8 weeks    PT Treatment/Interventions  ADLs/Self Care Home Management;Aquatic Therapy;Cryotherapy;Electrical Stimulation;Moist Heat;DME Instruction;Gait training;Stair training;Functional mobility training;Therapeutic activities;Therapeutic exercise;Balance training;Neuromuscular re-education;Patient/family education;Manual techniques;Passive range of motion;Dry needling;Energy conservation;Taping    PT Next Visit Plan  perform L GH joint mobs all directions for pain control and ROM improvements; continue manual distraction for pain control; Continue with AAROM for left shoulder; Lower extremity functional strengthening. Continue to progress with both shoulder, postural and lower extremity strengthening exercises.     PT Home Exercise  Plan  06/07/17: Wall walk left upper extremity 1 x 15 5 second holds 1x/day; Pec stretch 5 x 20 seconds 2x/day each upper extremity; Shoulder extension/rows/scapular retraction with RTB 2 x 10 bilateral upper extremities 1x/day, 06/21/17; decompression exercise; 07/10/17: Sit to stands from chair 2 x 10 3x/week. 07/17/17: Table slides x 15 for 15 second holds each upper extremity 1 x/day    Consulted and Agree with Plan of Care  Patient       Patient will benefit from skilled therapeutic intervention in order to improve the following deficits and impairments:  Increased fascial restricitons, Improper body mechanics, Pain, Decreased mobility, Postural dysfunction, Decreased activity tolerance, Decreased endurance, Decreased range of motion, Decreased strength, Hypomobility, Impaired UE functional use, Decreased balance, Difficulty walking  Visit Diagnosis: Left shoulder pain, unspecified chronicity  Pain in thoracic spine  Other symptoms and signs involving the musculoskeletal system  Stiffness of left shoulder, not elsewhere classified  Muscle weakness (generalized)     Problem List Patient Active Problem List   Diagnosis Date Noted  . Acute colitis 12/02/2016  . Uncontrolled hypertension 12/02/2016  . Enteritis 12/02/2016  . Hyponatremia 12/02/2016  . Status post total shoulder arthroplasty, left 11/21/2016  . COPD (chronic obstructive pulmonary disease) ([place]) 11/21/2016  .  Weakness 11/19/2016  . DJD of left shoulder 11/15/2016  . DOE (dyspnea on exertion) 11/11/2016  . Rotator cuff arthropathy of left shoulder 10/30/2016  . Intertrochanteric fracture of right hip ([place]) 08/01/2015    Class: Acute  . SMA stenosis ([place]) 09/15/2014  . Abdominal pain, right upper quadrant 09/15/2014  . Essential hypertension 09/15/2014  . Depression 09/15/2014  . Hypothyroid 09/15/2014  . GERD without esophagitis 09/15/2014  . Lumbago 11/12/2013  . Stiffness of joint, not elsewhere classified,  pelvic region and thigh 11/12/2013  . Muscle weakness (generalized) 11/12/2013  . Lack of coordination 03/21/2013  . Fine motor impairment 03/21/2013  . Left leg weakness 03/17/2013  . Risk for falls 03/17/2013  . Carotid artery disease ([place]) 03/11/2013  . Subclavian artery stenosis ([place]) 03/11/2013  . TIA (transient ischemic attack) 03/11/2013  . Tobacco use disorder 03/11/2013  . CVA (cerebral infarction) 03/05/2013  . Pain in joint, shoulder region 11/01/2010   Clarene Critchley PT, DPT 3:11 PM, 07/19/17 [place], [place], [address] Phone: [phone]   Fax:  [phone]  Name: [name] MRN: [id number] Date of Birth: [birth year]

## 2017-07-24 ENCOUNTER — Encounter (HOSPITAL_COMMUNITY): Payer: Self-pay | Admitting: Physical Therapy

## 2017-07-24 ENCOUNTER — Ambulatory Visit (HOSPITAL_COMMUNITY): Payer: PPO | Attending: Orthopedic Surgery | Admitting: Physical Therapy

## 2017-07-24 DIAGNOSIS — M6281 Muscle weakness (generalized): Secondary | ICD-10-CM | POA: Diagnosis not present

## 2017-07-24 DIAGNOSIS — M546 Pain in thoracic spine: Secondary | ICD-10-CM

## 2017-07-24 DIAGNOSIS — R29898 Other symptoms and signs involving the musculoskeletal system: Secondary | ICD-10-CM

## 2017-07-24 DIAGNOSIS — M25512 Pain in left shoulder: Secondary | ICD-10-CM | POA: Diagnosis not present

## 2017-07-24 DIAGNOSIS — M25612 Stiffness of left shoulder, not elsewhere classified: Secondary | ICD-10-CM | POA: Insufficient documentation

## 2017-07-24 NOTE — Therapy (Signed)
[place], [place], [address] Phone: [phone]   Fax:  [phone]  Physical Therapy Treatment  Patient Details  Name: [name] MRN: [id number] Date of Birth: [birth year] Referring Provider: Edmonia Lynch, MD   Encounter Date: 07/24/2017  PT End of Session - 07/24/17 1343    Visit Number  13    Number of Visits  17    Date for PT Re-Evaluation  07/31/17 Minireassess completed 07/05/17    Authorization Type  [employer] (No auth required)    Authorization Time Period  06/05/17 - 07/31/17    Authorization - Visit Number  4    Authorization - Number of Visits  10    PT Start Time  1301    PT Stop Time  1340    PT Time Calculation (min)  39 min    Activity Tolerance  Patient tolerated treatment well;Patient limited by fatigue;No increased pain    Behavior During Therapy  WFL for tasks assessed/performed       Past Medical History:  Diagnosis Date  . Anxiety   . Arthritis   . Carotid artery disease ([place]) 2007   R CEA; dopplers 10/2016:  R ICA moderate (non-obstructive plaque) ; L ICA ~50-69%, but may underestimate. (reassess next with either  CTA versus formal cerebral angiogram)   . Constipation due to pain medication   . COPD (chronic obstructive pulmonary disease) ([place])   . Depression   . Diverticulosis   . Fibromyalgia   . Gastritis   . GERD (gastroesophageal reflux disease)   . Head injury   . History of pneumonia   . History of stroke 06/3974   complication of R SubClav A PTA -- R hemispheric CVA  . Hypertension   . Hypothyroidism   . Osteoarthritis   . Peripheral vascular disease ([place])    Bilateral subclavian artery disease (status post R SubClav A PTA).  R side CEA (with US findings of Mod-Severe L CIA stenosis).  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   . Urgency incontinence     Past Surgical History:  Procedure Laterality Date  . ANTERIOR AND POSTERIOR  REPAIR  12/2000   Archie Endo 07/04/2010  . ANTERIOR CERVICAL DECOMP/DISCECTOMY FUSION N/A 04/03/2012   Procedure: ANTERIOR CERVICAL DECOMPRESSION/DISCECTOMY FUSION 2 LEVELS;  Surgeon: Hosie Spangle, MD;  Location: [facility];  Service: Neurosurgery;  Laterality: N/A;  Cervical four-five,Cervical five-six anterior cervical decompression with fusion plating and bonegraft  . BACK SURGERY    . BREAST SURGERY Right 02/2007   Archie Endo 06/23/2010  . BUNIONECTOMY WITH HAMMERTOE RECONSTRUCTION Left 09/10/2007   Archie Endo 06/23/2010  . CAROTID ENDARTERECTOMY Right 2007   [facility], Dr. Lucky Cowboy  . CATARACT EXTRACTION W/ INTRAOCULAR LENS IMPLANT Left    Archie Endo 06/23/2010  . CHOLECYSTECTOMY    . COLONOSCOPY N/A 07/15/2014   Procedure: COLONOSCOPY;  Surgeon: Rogene Houston, MD;  [facility];  Service: Endoscopy;  Laterality: N/A;  200 - moved to 2:10 - Ann to notify pt  . COMPRESSION HIP SCREW Right 08/01/2015   Procedure: COMPRESSION HIP;  Surgeon: Carole Civil, MD;  Location: [facility];  Service: Orthopedics;  Laterality: Right;  . ESOPHAGOGASTRODUODENOSCOPY N/A 07/15/2014   Procedure: ESOPHAGOGASTRODUODENOSCOPY (EGD);  Surgeon: Rogene Houston, MD;  [facility];  Service: Endoscopy;  Laterality: N/A;  . EYE SURGERY     cataract /lens both eyes  . FRACTURE SURGERY    .  INCONTINENCE SURGERY  12/2000   Tension-free transvaginal tape procedure/notes 07/04/2010  . JOINT REPLACEMENT    . LUMBAR FUSION  08/2006   Archie Endo 06/23/2010  . PERIPHERAL VASCULAR BALLOON ANGIOPLASTY  02/2013   Aortic arch angiography revealed 70% innominate/R subclavian stenosis along with 60% left common carotid ostial stenosis and moderate left subclavian artery stenosis. ->  Innominate artery PTA with 7 mm balloon reducing stenosis to roughly 30%.  Marland Kitchen PERIPHERAL VASCULAR CATHETERIZATION N/A 10/08/2014   Procedure: Visceral Angiography;  Surgeon: Algernon Huxley, MD;  Location: [facility];  Service: Cardiovascular;  -  long stenosis/occlusion of SMA. 75% celiac artery (PTA with 6 mm balloon)  . PERIPHERAL VASCULAR CATHETERIZATION N/A 10/08/2014   Procedure: Visceral Artery Intervention;  Surgeon: Algernon Huxley, MD;  Location: [facility];  Service: Cardiovascular;: 6 mm PTA balloon to stenosed CELIAC ARTERY  . ROTATOR CUFF REPAIR Left X 3  . TOTAL KNEE ARTHROPLASTY Bilateral 2005; 2011   right; left  . TOTAL SHOULDER ARTHROPLASTY Left 11/15/2016  . TOTAL SHOULDER ARTHROPLASTY Left 11/15/2016   Procedure: LEFT TOTAL SHOULDER ARTHROPLASTY;  Surgeon: [name], MD;  Location: [facility];  Service: Orthopedics;  Laterality: Left;  . TRANSTHORACIC ECHOCARDIOGRAM  10/2016   Normal LV wall thickness with LVEF 60-65% and grade 1 diastolic dysfunction.  Aortic valve calcific sclerosis with no stenosis.  Mildly calcified mitral annulus and leaflets.   Marland Kitchen VAGINAL HYSTERECTOMY  12/2000   Archie Endo 07/04/2010    There were no vitals filed for this visit.  Subjective Assessment - 07/24/17 1303    Subjective  Patient stated that she has been doing her exercises and denied any pain currently.     Patient Stated Goals  To get more motion in her left shoulder and to be able to walk longer    Currently in Pain?  No/denies                       [facility] Adult PT Treatment/Exercise - 07/24/17 0001      Lumbar Exercises: Standing   Other Standing Lumbar Exercises  Lumbar extension 2 x 10 repetitions      Lumbar Exercises: Supine   Clam  20 reps 2 x 10 with red theraband around thighs    Bridge  20 reps 2 x 10      Knee/Hip Exercises: Standing   Forward Step Up  Right;Left;Step Height: 6";Hand Hold: 2;15 reps    Other Standing Knee Exercises  Sidestepping with red theraband around thighs 14 feet x 2 roundtrips      Shoulder Exercises: Sidelying   Other Sidelying Exercises  Thoracic openers x 15 each upper extremity to try and increase mobility in thoracic spine and shoulders      Shoulder Exercises:  Standing   Other Standing Exercises  Shoulder adduction with red theraband 2 x 10 repetitions RTB. Palloff press with red theraband with partial tandem stance x 20 repetitions each LE forward    Other Standing Exercises  Rolling ball on wall clockwise and counterclockwise x 10 each upper extremity. Snow angels through partial ROM x 15 bilateral upper extremities      Shoulder Exercises: ROM/Strengthening   Other ROM/Strengthening Exercises  wall walk 10x each (Lt 11 on peg board and Rt 13 peg board)      Shoulder Exercises: Isometric Strengthening   Other Isometric Exercises  Shoulder external and internal rotation isometrics 10 x 10 second holds, bilateral shoulder  PT Education - 07/24/17 1303    Education provided  Yes    Education Details  Patient was educated on purpose and technique of exercises throughout session.     Person(s) Educated  Patient    Methods  Explanation;Tactile cues;Verbal cues    Comprehension  Verbalized understanding;Returned demonstration       PT Short Term Goals - 07/05/17 1124      PT SHORT TERM GOAL #1   Title  Patient will demonstrate understanding and report regular compliance with home exercise program to improve strength, range of motion, and decrease pain.     Baseline  5/16: reports compliance with HEP at least 1x/day    Time  4    Period  Weeks    Status  Achieved      PT SHORT TERM GOAL #2   Title  Patient will demonstrate improvement of 1/2 Grade MMT strength in all musculature tested as deficient at evaluation to assist patient with lifting activities, functional squatting, and other functional mobility.     Time  4    Period  Weeks    Status  New      PT SHORT TERM GOAL #3   Title  Patient will report no greater than a 5/10 pain in her left shoulder or back over the course of a 1 week period indicating better tolerance with daily activities.     Time  4    Period  Weeks    Status  Achieved      PT SHORT TERM GOAL #4    Title  Patient will demonstrate improvement of 10 degrees in left shoulder active range of motion in all movements measured as deficient at evaluation to assist patient with lifting activities, and patient will be able to perform external rotation of left shoulder to touch base of neck and internal rotation of left shoulder to reach T5 for increased ease of adjusting clothing.     Time  4    Period  Weeks    Status  New        PT Long Term Goals - 07/05/17 1126      PT LONG TERM GOAL #1   Title  Patient will demonstrate improvement of 1 Grade MMT strength in all musculature tested as deficient at evaluation to assist patient with lifting activities, functional squatting, and other functional mobility.     Time  8    Period  Weeks    Status  New      PT LONG TERM GOAL #2   Title  Patient will demonstrate improvement of 20 degrees in left shoulder active range of motion in all movements measured as deficient at evaluation to assist patient with lifting activities.     Time  8    Period  Weeks    Status  New      PT LONG TERM GOAL #3   Title  Patient will perform the 5 times sit to stand test in 13 seconds or less indicating improved balance, decreased risk of falls, and overall improved functional mobility.     Time  8    Period  Weeks    Status  New      PT LONG TERM GOAL #4   Title  Patient will perform lifting 1 box with 5 pound weight to shoulder level without increase of shoulder pain indicating improved ability to perform lifting in daily routine.     Time  8    Period  Weeks    Status  New      PT LONG TERM GOAL #5   Title  Patient will report no greater than a 3/10 pain in either her left shoulder or back over the course of a 1 week period indicating improved tolerance to daily activities.     Baseline  5/16: "at times"    Time  8    Period  Weeks    Status  On-going            Plan - 07/24/17 1344    Clinical Impression Statement  This session continued with  progression of lower extremity functional strengthening as well as shoulder mobility and strengthening exercises. This session added step-ups onto 6-inch step. This session also added some mat exercises to further target patient's hip extensors. This session also added rolling a ball on the wall clockwise and counterclockwise to improve shoulder mechanics and thoracic mobility on each upper extremity. Patient tolerated all exercises well and denied any increase in pain at the end of the session.     Rehab Potential  Fair    Clinical Impairments Affecting Rehab Potential  Positive: Patient's motivation; Negative: comorbidities, chronicity of pain    PT Frequency  2x / week    PT Duration  8 weeks    PT Treatment/Interventions  ADLs/Self Care Home Management;Aquatic Therapy;Cryotherapy;Electrical Stimulation;Moist Heat;DME Instruction;Gait training;Stair training;Functional mobility training;Therapeutic activities;Therapeutic exercise;Balance training;Neuromuscular re-education;Patient/family education;Manual techniques;Passive range of motion;Dry needling;Energy conservation;Taping    PT Next Visit Plan  perform L GH joint mobs all directions for pain control and ROM improvements; continue manual distraction for pain control; Continue with AAROM for left shoulder; Lower extremity functional strengthening. Continue to progress with both shoulder, postural and lower extremity strengthening exercises.     PT Home Exercise Plan  06/07/17: Wall walk left upper extremity 1 x 15 5 second holds 1x/day; Pec stretch 5 x 20 seconds 2x/day each upper extremity; Shoulder extension/rows/scapular retraction with RTB 2 x 10 bilateral upper extremities 1x/day, 06/21/17; decompression exercise; 07/10/17: Sit to stands from chair 2 x 10 3x/week. 07/17/17: Table slides x 15 for 15 second holds each upper extremity 1 x/day    Consulted and Agree with Plan of Care  Patient       Patient will benefit from skilled therapeutic  intervention in order to improve the following deficits and impairments:  Increased fascial restricitons, Improper body mechanics, Pain, Decreased mobility, Postural dysfunction, Decreased activity tolerance, Decreased endurance, Decreased range of motion, Decreased strength, Hypomobility, Impaired UE functional use, Decreased balance, Difficulty walking  Visit Diagnosis: Left shoulder pain, unspecified chronicity  Pain in thoracic spine  Other symptoms and signs involving the musculoskeletal system  Stiffness of left shoulder, not elsewhere classified  Muscle weakness (generalized)     Problem List Patient Active Problem List   Diagnosis Date Noted  . Acute colitis 12/02/2016  . Uncontrolled hypertension 12/02/2016  . Enteritis 12/02/2016  . Hyponatremia 12/02/2016  . Status post total shoulder arthroplasty, left 11/21/2016  . COPD (chronic obstructive pulmonary disease) ([place]) 11/21/2016  . Weakness 11/19/2016  . DJD of left shoulder 11/15/2016  . DOE (dyspnea on exertion) 11/11/2016  . Rotator cuff arthropathy of left shoulder 10/30/2016  . Intertrochanteric fracture of right hip ([place]) 08/01/2015    Class: Acute  . SMA stenosis ([place]) 09/15/2014  . Abdominal pain, right upper quadrant 09/15/2014  . Essential hypertension 09/15/2014  . Depression 09/15/2014  . Hypothyroid 09/15/2014  . GERD without esophagitis 09/15/2014  . Lumbago  11/12/2013  . Stiffness of joint, not elsewhere classified, pelvic region and thigh 11/12/2013  . Muscle weakness (generalized) 11/12/2013  . Lack of coordination 03/21/2013  . Fine motor impairment 03/21/2013  . Left leg weakness 03/17/2013  . Risk for falls 03/17/2013  . Carotid artery disease ([place]) 03/11/2013  . Subclavian artery stenosis ([place]) 03/11/2013  . TIA (transient ischemic attack) 03/11/2013  . Tobacco use disorder 03/11/2013  . CVA (cerebral infarction) 03/05/2013  . Pain in joint, shoulder region 11/01/2010   Clarene Critchley PT,  DPT 1:47 PM, 07/24/17 [place], [place], [address] Phone: [phone]   Fax:  [phone]  Name: [name] MRN: [id number] Date of Birth: 10-26-1935

## 2017-07-26 ENCOUNTER — Encounter (HOSPITAL_COMMUNITY): Payer: Self-pay | Admitting: Physical Therapy

## 2017-07-26 ENCOUNTER — Ambulatory Visit (HOSPITAL_COMMUNITY): Payer: PPO | Admitting: Physical Therapy

## 2017-07-26 DIAGNOSIS — M546 Pain in thoracic spine: Secondary | ICD-10-CM

## 2017-07-26 DIAGNOSIS — M6281 Muscle weakness (generalized): Secondary | ICD-10-CM

## 2017-07-26 DIAGNOSIS — M25612 Stiffness of left shoulder, not elsewhere classified: Secondary | ICD-10-CM

## 2017-07-26 DIAGNOSIS — R29898 Other symptoms and signs involving the musculoskeletal system: Secondary | ICD-10-CM

## 2017-07-26 DIAGNOSIS — M25512 Pain in left shoulder: Secondary | ICD-10-CM | POA: Diagnosis not present

## 2017-07-26 NOTE — Therapy (Signed)
[place], [place], [address] Phone: [phone]   Fax:  [phone]  Physical Therapy Treatment  Patient Details  Name: MUNACHIMSO RIGDON MRN: [id number] Date of Birth: 08-01-1935 Referring Provider: Edmonia Lynch, MD   Encounter Date: 07/26/2017  PT End of Session - 07/26/17 1310    Visit Number  14    Number of Visits  17    Date for PT Re-Evaluation  07/31/17 Minireassess completed 07/05/17    Authorization Type  [employer] (No auth required)    Authorization Time Period  06/05/17 - 07/31/17    Authorization - Visit Number  5    Authorization - Number of Visits  10    PT Start Time  1300    PT Stop Time  1340    PT Time Calculation (min)  40 min    Activity Tolerance  Patient tolerated treatment well;Patient limited by fatigue;No increased pain    Behavior During Therapy  WFL for tasks assessed/performed       Past Medical History:  Diagnosis Date  . Anxiety   . Arthritis   . Carotid artery disease ([place]) 2007   R CEA; dopplers 10/2016:  R ICA moderate (non-obstructive plaque) ; L ICA ~50-69%, but may underestimate. (reassess next with either  CTA versus formal cerebral angiogram)   . Constipation due to pain medication   . COPD (chronic obstructive pulmonary disease) ([place])   . Depression   . Diverticulosis   . Fibromyalgia   . Gastritis   . GERD (gastroesophageal reflux disease)   . Head injury   . History of pneumonia   . History of stroke 60/1093   complication of R SubClav A PTA -- R hemispheric CVA  . Hypertension   . Hypothyroidism   . Osteoarthritis   . Peripheral vascular disease ([place])    Bilateral subclavian artery disease (status post R SubClav A PTA).  R side CEA (with US findings of Mod-Severe L CIA stenosis).  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   . Urgency incontinence     Past Surgical History:  Procedure Laterality Date  . ANTERIOR AND POSTERIOR  REPAIR  12/2000   Archie Endo 07/04/2010  . ANTERIOR CERVICAL DECOMP/DISCECTOMY FUSION N/A 04/03/2012   Procedure: ANTERIOR CERVICAL DECOMPRESSION/DISCECTOMY FUSION 2 LEVELS;  Surgeon: Hosie Spangle, MD;  Location: [facility];  Service: Neurosurgery;  Laterality: N/A;  Cervical four-five,Cervical five-six anterior cervical decompression with fusion plating and bonegraft  . BACK SURGERY    . BREAST SURGERY Right 02/2007   Ar[facility] Endo 06/23/2010  . BUNIONECTOMY WITH HAMMERTOE RECONSTRUCTION Left 09/10/2007   [facility] Endo 06/23/2010  . CAROTID ENDARTERECTOMY Right 2007   [facility], Dr. Lucky Cowboy  . CATARACT EXTRACTION W/ INTRAOCULAR LENS IMPLANT Left    [facility] Endo 06/23/2010  . CHOLECYSTECTOMY    . COLONOSCOPY N/A 07/15/2014   Procedure: COLONOSCOPY;  Surgeon: Rogene Houston, MD;  [facility];  Service: Endoscopy;  Laterality: N/A;  200 - moved to 2:10 - Ann to notify pt  . COMPRESSION HIP SCREW Right 08/01/2015   Procedure: COMPRESSION HIP;  Surgeon: Carole Civil, MD;  Location: [facility];  Service: Orthopedics;  Laterality: Right;  . ESOPHAGOGASTRODUODENOSCOPY N/A 07/15/2014   Procedure: ESOPHAGOGASTRODUODENOSCOPY (EGD);  Surgeon: Rogene Houston, MD;  [facility];  Service: Endoscopy;  Laterality: N/A;  . EYE SURGERY     cataract /lens both eyes  . FRACTURE SURGERY    .  INCONTINENCE SURGERY  12/2000   Tension-free transvaginal tape procedure/notes 07/04/2010  . JOINT REPLACEMENT    . LUMBAR FUSION  08/2006   Ar[facility] Endo 06/23/2010  . PERIPHERAL VASCULAR BALLOON ANGIOPLASTY  02/2013   Aortic arch angiography revealed 70% innominate/R subclavian stenosis along with 60% left common carotid ostial stenosis and moderate left subclavian artery stenosis. ->  Innominate artery PTA with 7 mm balloon reducing stenosis to roughly 30%.  Marland Kitchen PERIPHERAL VASCULAR CATHETERIZATION N/A 10/08/2014   Procedure: Visceral Angiography;  Surgeon: Algernon Huxley, MD;  Location: [facility];  Service: Cardiovascular;  -  long stenosis/occlusion of SMA. 75% celiac artery (PTA with 6 mm balloon)  . PERIPHERAL VASCULAR CATHETERIZATION N/A 10/08/2014   Procedure: Visceral Artery Intervention;  Surgeon: Algernon Huxley, MD;  Location: [facility];  Service: Cardiovascular;: 6 mm PTA balloon to stenosed CELIAC ARTERY  . ROTATOR CUFF REPAIR Left X 3  . TOTAL KNEE ARTHROPLASTY Bilateral 2005; 2011   right; left  . TOTAL SHOULDER ARTHROPLASTY Left 11/15/2016  . TOTAL SHOULDER ARTHROPLASTY Left 11/15/2016   Procedure: LEFT TOTAL SHOULDER ARTHROPLASTY;  Surgeon: [name], MD;  Location: [facility];  Service: Orthopedics;  Laterality: Left;  . TRANSTHORACIC ECHOCARDIOGRAM  10/2016   Normal LV wall thickness with LVEF 60-65% and grade 1 diastolic dysfunction.  Aortic valve calcific sclerosis with no stenosis.  Mildly calcified mitral annulus and leaflets.   Marland Kitchen VAGINAL HYSTERECTOMY  12/2000   [facility] Endo 07/04/2010    There were no vitals filed for this visit.  Subjective Assessment - 07/26/17 1302    Subjective  Patient stated that she has not been doing her exercises at home. Patient denied pain currently.     Patient Stated Goals  To get more motion in her left shoulder and to be able to walk longer    Currently in Pain?  No/denies                       [facility] Adult PT Treatment/Exercise - 07/26/17 0001      Lumbar Exercises: Standing   Other Standing Lumbar Exercises  Lumbar extension 2 x 10 repetitions      Lumbar Exercises: Supine   Clam  20 reps 2 x 10 with Red theraband around thighs    Bridge  20 reps 2 x 10 with ball squeeze      Knee/Hip Exercises: Standing   Forward Step Up  Right;Left;Step Height: 6";Hand Hold: 2;15 reps    Functional Squat  1 set;10 reps Maximal tactile and verbal cues for form bilateral HHA    Other Standing Knee Exercises  Sidestepping with red theraband around thighs 14 feet x 2 roundtrips      Shoulder Exercises: Sidelying   Other Sidelying Exercises  Thoracic  openers x 15 each upper extremity to try and increase mobility in thoracic spine and shoulders      Shoulder Exercises: Standing   Other Standing Exercises  Shoulder adduction with red theraband 2 x 10 repetitions RTB. Palloff press with red theraband with partial tandem stance x 20 repetitions each LE forward    Other Standing Exercises  Rolling ball on wall clockwise and counterclockwise x 15 each upper extremity. Snow angels through partial ROM x 15 bilateral upper extremities      Shoulder Exercises: ROM/Strengthening   Other ROM/Strengthening Exercises  wall walk 10x each (Lt 11 on peg board and Rt 13 peg board)      Shoulder Exercises:  Isometric Strengthening   Other Isometric Exercises  Shoulder external and internal rotation isometrics 10 x 10 second holds, bilateral shoulder             PT Education - 07/26/17 1304    Education provided  Yes    Education Details  Patient was educated on purpose and technique of exercises throughout session.     Person(s) Educated  Patient    Methods  Explanation;Tactile cues;Verbal cues    Comprehension  Verbalized understanding;Returned demonstration       PT Short Term Goals - 07/05/17 1124      PT SHORT TERM GOAL #1   Title  Patient will demonstrate understanding and report regular compliance with home exercise program to improve strength, range of motion, and decrease pain.     Baseline  5/16: reports compliance with HEP at least 1x/day    Time  4    Period  Weeks    Status  Achieved      PT SHORT TERM GOAL #2   Title  Patient will demonstrate improvement of 1/2 Grade MMT strength in all musculature tested as deficient at evaluation to assist patient with lifting activities, functional squatting, and other functional mobility.     Time  4    Period  Weeks    Status  New      PT SHORT TERM GOAL #3   Title  Patient will report no greater than a 5/10 pain in her left shoulder or back over the course of a 1 week period indicating  better tolerance with daily activities.     Time  4    Period  Weeks    Status  Achieved      PT SHORT TERM GOAL #4   Title  Patient will demonstrate improvement of 10 degrees in left shoulder active range of motion in all movements measured as deficient at evaluation to assist patient with lifting activities, and patient will be able to perform external rotation of left shoulder to touch base of neck and internal rotation of left shoulder to reach T5 for increased ease of adjusting clothing.     Time  4    Period  Weeks    Status  New        PT Long Term Goals - 07/05/17 1126      PT LONG TERM GOAL #1   Title  Patient will demonstrate improvement of 1 Grade MMT strength in all musculature tested as deficient at evaluation to assist patient with lifting activities, functional squatting, and other functional mobility.     Time  8    Period  Weeks    Status  New      PT LONG TERM GOAL #2   Title  Patient will demonstrate improvement of 20 degrees in left shoulder active range of motion in all movements measured as deficient at evaluation to assist patient with lifting activities.     Time  8    Period  Weeks    Status  New      PT LONG TERM GOAL #3   Title  Patient will perform the 5 times sit to stand test in 13 seconds or less indicating improved balance, decreased risk of falls, and overall improved functional mobility.     Time  8    Period  Weeks    Status  New      PT LONG TERM GOAL #4   Title  Patient will perform lifting 1  box with 5 pound weight to shoulder level without increase of shoulder pain indicating improved ability to perform lifting in daily routine.     Time  8    Period  Weeks    Status  New      PT LONG TERM GOAL #5   Title  Patient will report no greater than a 3/10 pain in either her left shoulder or back over the course of a 1 week period indicating improved tolerance to daily activities.     Baseline  5/16: "at times"    Time  8    Period  Weeks     Status  On-going            Plan - 07/26/17 1330    Clinical Impression Statement  This session continued to progress patient with functional lower extremity strengthening and exercises to improve patient's shoulder mobility and strength. This session added functional squats and educated patient about proper form and technique with both verbal and tactile cues. This session also increased repetitions with rolling the ball on the wall. Patient was unable to perform shoulder internal or external rotation with theraband or without theraband through full ROM, therefore continued with isometrics to improve shoulder strength with internal and external rotation. Plan to re-assess patient at next visit to assess progress with therapy.     Rehab Potential  Fair    Clinical Impairments Affecting Rehab Potential  Positive: Patient's motivation; Negative: comorbidities, chronicity of pain    PT Frequency  2x / week    PT Duration  8 weeks    PT Treatment/Interventions  ADLs/Self Care Home Management;Aquatic Therapy;Cryotherapy;Electrical Stimulation;Moist Heat;DME Instruction;Gait training;Stair training;Functional mobility training;Therapeutic activities;Therapeutic exercise;Balance training;Neuromuscular re-education;Patient/family education;Manual techniques;Passive range of motion;Dry needling;Energy conservation;Taping    PT Next Visit Plan  Re-assess next session. Continue Lower extremity functional strengthening. Continue to progress with both shoulder, postural and lower extremity strengthening exercises.     PT Home Exercise Plan  06/07/17: Wall walk left upper extremity 1 x 15 5 second holds 1x/day; Pec stretch 5 x 20 seconds 2x/day each upper extremity; Shoulder extension/rows/scapular retraction with RTB 2 x 10 bilateral upper extremities 1x/day, 06/21/17; decompression exercise; 07/10/17: Sit to stands from chair 2 x 10 3x/week. 07/17/17: Table slides x 15 for 15 second holds each upper extremity 1  x/day    Consulted and Agree with Plan of Care  Patient       Patient will benefit from skilled therapeutic intervention in order to improve the following deficits and impairments:  Increased fascial restricitons, Improper body mechanics, Pain, Decreased mobility, Postural dysfunction, Decreased activity tolerance, Decreased endurance, Decreased range of motion, Decreased strength, Hypomobility, Impaired UE functional use, Decreased balance, Difficulty walking  Visit Diagnosis: Left shoulder pain, unspecified chronicity  Pain in thoracic spine  Other symptoms and signs involving the musculoskeletal system  Stiffness of left shoulder, not elsewhere classified  Muscle weakness (generalized)     Problem List Patient Active Problem List   Diagnosis Date Noted  . Acute colitis 12/02/2016  . Uncontrolled hypertension 12/02/2016  . Enteritis 12/02/2016  . Hyponatremia 12/02/2016  . Status post total shoulder arthroplasty, left 11/21/2016  . COPD (chronic obstructive pulmonary disease) ([place]) 11/21/2016  . Weakness 11/19/2016  . DJD of left shoulder 11/15/2016  . DOE (dyspnea on exertion) 11/11/2016  . Rotator cuff arthropathy of left shoulder 10/30/2016  . Intertrochanteric fracture of right hip ([address]) 08/01/2015    Class: Acute  . SMA stenosis ([place]) 09/15/2014  .  Abdominal pain, right upper quadrant 09/15/2014  . Essential hypertension 09/15/2014  . Depression 09/15/2014  . Hypothyroid 09/15/2014  . GERD without esophagitis 09/15/2014  . Lumbago 11/12/2013  . Stiffness of joint, not elsewhere classified, pelvic region and thigh 11/12/2013  . Muscle weakness (generalized) 11/12/2013  . Lack of coordination 03/21/2013  . Fine motor impairment 03/21/2013  . Left leg weakness 03/17/2013  . Risk for falls 03/17/2013  . Carotid artery disease ([place]) 03/11/2013  . Subclavian artery stenosis ([place]) 03/11/2013  . TIA (transient ischemic attack) 03/11/2013  . Tobacco use disorder  03/11/2013  . CVA (cerebral infarction) 03/05/2013  . Pain in joint, shoulder region 11/01/2010   Clarene Critchley PT, DPT 1:45 PM, 07/26/17 [place], [place], [address] Phone: [phone]   Fax:  [phone]  Name: WILLONA PHARISS MRN: [id number] Date of Birth: [birth year]

## 2017-07-31 ENCOUNTER — Ambulatory Visit (HOSPITAL_COMMUNITY): Payer: PPO | Admitting: Physical Therapy

## 2017-07-31 ENCOUNTER — Encounter (HOSPITAL_COMMUNITY): Payer: Self-pay | Admitting: Physical Therapy

## 2017-07-31 DIAGNOSIS — M25512 Pain in left shoulder: Secondary | ICD-10-CM

## 2017-07-31 DIAGNOSIS — M546 Pain in thoracic spine: Secondary | ICD-10-CM

## 2017-07-31 DIAGNOSIS — M6281 Muscle weakness (generalized): Secondary | ICD-10-CM

## 2017-07-31 DIAGNOSIS — M25612 Stiffness of left shoulder, not elsewhere classified: Secondary | ICD-10-CM

## 2017-07-31 DIAGNOSIS — R29898 Other symptoms and signs involving the musculoskeletal system: Secondary | ICD-10-CM

## 2017-07-31 NOTE — Therapy (Signed)
[facility] [address] [place], [place], [address] Phone: [phone]   Fax:  [phone]  Physical Therapy Treatment / Re-assessment / Discharge Summary  Patient Details  Name: [name] MRN: [id number] Date of Birth: June 16, 1935 Referring Provider: Edmonia Lynch, MD   Encounter Date: 07/31/2017  PT End of Session - 07/31/17 1352    Visit Number  15    Number of Visits  17    Date for PT Re-Evaluation  07/31/17 Minireassess completed 07/05/17    Authorization Type  [employer] (No auth required)    Authorization Time Period  06/05/17 - 07/31/17    Authorization - Visit Number  6    Authorization - Number of Visits  10    PT Start Time  4235    PT Stop Time  3614 Some time unbilled for re-assessment    PT Time Calculation (min)  40 min    Activity Tolerance  Patient tolerated treatment well;No increased pain    Behavior During Therapy  WFL for tasks assessed/performed       Past Medical History:  Diagnosis Date  . Anxiety   . Arthritis   . Carotid artery disease ([place]) 2007   R CEA; dopplers 10/2016:  R ICA moderate (non-obstructive plaque) ; L ICA ~50-69%, but may underestimate. (reassess next with either  CTA versus formal cerebral angiogram)   . Constipation due to pain medication   . COPD (chronic obstructive pulmonary disease) ([place])   . Depression   . Diverticulosis   . Fibromyalgia   . Gastritis   . GERD (gastroesophageal reflux disease)   . Head injury   . History of pneumonia   . History of stroke 43/1540   complication of R SubClav A PTA -- R hemispheric CVA  . Hypertension   . Hypothyroidism   . Osteoarthritis   . Peripheral vascular disease ([place])    Bilateral subclavian artery disease (status post R SubClav A PTA).  R side CEA (with US findings of Mod-Severe L CIA stenosis).  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   . Urgency incontinence     Past Surgical History:  Procedure  Laterality Date  . ANTERIOR AND POSTERIOR REPAIR  12/2000   Archie Endo 07/04/2010  . ANTERIOR CERVICAL DECOMP/DISCECTOMY FUSION N/A 04/03/2012   Procedure: ANTERIOR CERVICAL DECOMPRESSION/DISCECTOMY FUSION 2 LEVELS;  Surgeon: Hosie Spangle, MD;  Location: [facility];  Service: Neurosurgery;  Laterality: N/A;  Cervical four-five,Cervical five-six anterior cervical decompression with fusion plating and bonegraft  . BACK SURGERY    . BREAST SURGERY Right 02/2007   Archie Endo 06/23/2010  . BUNIONECTOMY WITH HAMMERTOE RECONSTRUCTION Left 09/10/2007   Archie Endo 06/23/2010  . CAROTID ENDARTERECTOMY Right 2007   [facility], Dr. Lucky Cowboy  . CATARACT EXTRACTION W/ INTRAOCULAR LENS IMPLANT Left    Archie Endo 06/23/2010  . CHOLECYSTECTOMY    . COLONOSCOPY N/A 07/15/2014   Procedure: COLONOSCOPY;  Surgeon: Rogene Houston, MD;  [facility];  Service: Endoscopy;  Laterality: N/A;  200 - moved to 2:10 - Ann to notify pt  . COMPRESSION HIP SCREW Right 08/01/2015   Procedure: COMPRESSION HIP;  Surgeon: Carole Civil, MD;  Location: [facility];  Service: Orthopedics;  Laterality: Right;  . ESOPHAGOGASTRODUODENOSCOPY N/A 07/15/2014   Procedure: ESOPHAGOGASTRODUODENOSCOPY (EGD);  Surgeon: Rogene Houston, MD;  [facility];  Service: Endoscopy;  Laterality: N/A;  . EYE SURGERY     cataract /lens both eyes  .  FRACTURE SURGERY    . INCONTINENCE SURGERY  12/2000   Tension-free transvaginal tape procedure/notes 07/04/2010  . JOINT REPLACEMENT    . LUMBAR FUSION  08/2006   Archie Endo 06/23/2010  . PERIPHERAL VASCULAR BALLOON ANGIOPLASTY  02/2013   Aortic arch angiography revealed 70% innominate/R subclavian stenosis along with 60% left common carotid ostial stenosis and moderate left subclavian artery stenosis. ->  Innominate artery PTA with 7 mm balloon reducing stenosis to roughly 30%.  Marland Kitchen PERIPHERAL VASCULAR CATHETERIZATION N/A 10/08/2014   Procedure: Visceral Angiography;  Surgeon: Algernon Huxley, MD;  Location: [facility];  Service: Cardiovascular;  - long stenosis/occlusion of SMA. 75% celiac artery (PTA with 6 mm balloon)  . PERIPHERAL VASCULAR CATHETERIZATION N/A 10/08/2014   Procedure: Visceral Artery Intervention;  Surgeon: Algernon Huxley, MD;  Location: [facility];  Service: Cardiovascular;: 6 mm PTA balloon to stenosed CELIAC ARTERY  . ROTATOR CUFF REPAIR Left X 3  . TOTAL KNEE ARTHROPLASTY Bilateral 2005; 2011   right; left  . TOTAL SHOULDER ARTHROPLASTY Left 11/15/2016  . TOTAL SHOULDER ARTHROPLASTY Left 11/15/2016   Procedure: LEFT TOTAL SHOULDER ARTHROPLASTY;  Surgeon: [name], MD;  Location: [facility];  Service: Orthopedics;  Laterality: Left;  . TRANSTHORACIC ECHOCARDIOGRAM  10/2016   Normal LV wall thickness with LVEF 60-65% and grade 1 diastolic dysfunction.  Aortic valve calcific sclerosis with no stenosis.  Mildly calcified mitral annulus and leaflets.   Marland Kitchen VAGINAL HYSTERECTOMY  12/2000   Archie Endo 07/04/2010    There were no vitals filed for this visit.  Subjective Assessment - 07/31/17 1351    Subjective  Patient stated that she is not having any pain currently. She stated she has been trying to do her exercises at home. Patient reported that she has had no greater than a 3/10 pain in either her back or shoulder this week.     Limitations  Walking;Standing;Lifting;House hold activities    How long can you sit comfortably?  Not limited     How long can you stand comfortably?  5 minutes    How long can you walk comfortably?  Half a block; 10 minutes    Patient Stated Goals  To get more motion in her left shoulder and to be able to walk longer    Currently in Pain?  No/denies         [facility] Assessment - 07/31/17 0001      Assessment   Medical Diagnosis  lumbago; Left reverse total shoulder arthroplasty     Referring Provider  Edmonia Lynch, MD    Onset Date/Surgical Date  -- Shoulder surgery 11/15/16    Prior Therapy  Yes. For her back, and shoulder at home  health therapy      [place] to enter    Entrance Stairs-Number of Steps  3    [place]  One level    [place] - 2 wheels;Shower seat - built in;Grab bars - tub/shower;Grab bars - toilet;Toilet riser      Prior Function   Level of Independence  Independent;Independent with basic ADLs      Cognition   Overall Cognitive Status  Within Functional Limits for tasks assessed      Observation/Other Assessments  Focus on Therapeutic Outcomes (FOTO)   54% (46% limited) was 54% (46% limited)      Posture/Postural Control   Posture Comments  Significant Thoracic kyphosis, rounded shoulders      AROM   Left Shoulder Flexion  105 Degrees was 100 then 104    Left Shoulder ABduction  90 Degrees was 90    Left Shoulder Internal Rotation  50 Degrees reaches L5. Was 34 and reached L5    Left Shoulder External Rotation  5 Degrees Was 5. Able to reach base of neck with A from other UE    Lumbar Flexion  WNL    Lumbar Extension  Moderately limited    Lumbar - Right Side Bend  Able to reach knee joint    Lumbar - Left Side Bend  Able to reach knee joint    Lumbar - Right Rotation  Moderately limited    Lumbar - Left Rotation  Moderately limited    Thoracic Flexion  WNL     Thoracic Extension  Moderately limited    Thoracic - Right Side Bend  WNL    Thoracic - Left Side Bend  WNL    Thoracic - Right Rotation  WNL    Thoracic - Left Rotation  WNL      Strength   Right Shoulder Extension  4+/5 was 4+    Right Shoulder ABduction  4+/5 was 4+    Right Shoulder Internal Rotation  4+/5 was 4+    Right Shoulder External Rotation  3-/5 was 3-    Left Shoulder Flexion  4/5 was 4/5    Left Shoulder Extension  4+/5 was 4+/5    Left Shoulder ABduction  4/5 was 4/5    Left Shoulder Internal Rotation  4/5 was 4/5     Left Shoulder External Rotation  3+/5 was 3+/5    Right Elbow Flexion  5/5 was 5/5    Right Elbow Extension  5/5 was 5/5    Left Elbow Flexion  5/5 was 5/5    Left Elbow Extension  5/5 was 4+/5    Right Hip Flexion  4/5 was 4/5    Right Hip Extension  2+/5 was 2+/5    Right Hip ABduction  4+/5 was 4/5    Left Hip Flexion  4+/5 was 4/5    Left Hip Extension  2+/5 was 2+/5    Left Hip ABduction  4/5 was 4-/5    Right Knee Flexion  5/5 was 5/5    Right Knee Extension  5/5 was 5/5    Left Knee Flexion  5/5 was 5/5    Left Knee Extension  5/5 was 5/5    Right Ankle Dorsiflexion  5/5 was 5/5    Left Ankle Dorsiflexion  5/5 was 5/5      Standardized Balance Assessment   Standardized Balance Assessment  Five Times Sit to Stand    Five times sit to stand comments   16.01 seconds (ws 20 then 19.7 seconds)                           PT Education - 07/31/17 1456    Education provided  Yes    Education Details  Patient was edcuated on re-assessment findings, plan for discharge, updated HEP, and fall prevention tips.     Person(s) Educated  Patient    Methods  Explanation;Handout    Comprehension  Verbalized understanding  PT Short Term Goals - 07/31/17 1448      PT SHORT TERM GOAL #1   Title  Patient will demonstrate understanding and report regular compliance with home exercise program to improve strength, range of motion, and decrease pain.     Baseline  5/16: reports compliance with HEP at least 1x/day    Time  4    Period  Weeks    Status  Achieved      PT SHORT TERM GOAL #2   Title  Patient will demonstrate improvement of 1/2 Grade MMT strength in all musculature tested as deficient at evaluation to assist patient with lifting activities, functional squatting, and other functional mobility.     Baseline  07/31/17: Patient improved by 1/2 MMT grade in some, but not all musculature. See MMT.     Time  4    Period  Weeks    Status  On-going      PT SHORT  TERM GOAL #3   Title  Patient will report no greater than a 5/10 pain in her left shoulder or back over the course of a 1 week period indicating better tolerance with daily activities.     Baseline  07/31/17: Patient reported no greater than a 3/10 pain in her shoulders or back over the last week.     Time  4    Period  Weeks    Status  Achieved      PT SHORT TERM GOAL #4   Title  Patient will demonstrate improvement of 10 degrees in left shoulder active range of motion in all movements measured as deficient at evaluation to assist patient with lifting activities, and patient will be able to perform external rotation of left shoulder to touch base of neck and internal rotation of left shoulder to reach T5 for increased ease of adjusting clothing.     Baseline  07/31/17: Patient did not achieve this goal. See ROM.     Time  4    Period  Weeks    Status  Not Met        PT Long Term Goals - 07/31/17 1451      PT LONG TERM GOAL #1   Title  Patient will demonstrate improvement of 1 Grade MMT strength in all musculature tested as deficient at evaluation to assist patient with lifting activities, functional squatting, and other functional mobility.     Baseline  07/31/17: Patient improved strength by 1/2 MMT grade in some musculature. See MMT.     Time  8    Period  Weeks    Status  On-going      PT LONG TERM GOAL #2   Title  Patient will demonstrate improvement of 20 degrees in left shoulder active range of motion in all movements measured as deficient at evaluation to assist patient with lifting activities.     Baseline  07/31/17: Patient did not achieve this goal. See ROM.     Time  8    Period  Weeks    Status  Not Met      PT LONG TERM GOAL #3   Title  Patient will perform the 5 times sit to stand test in 13 seconds or less indicating improved balance, decreased risk of falls, and overall improved functional mobility.     Baseline  07/31/17: Patient performed in 16.01 seconds at this  session.     Time  8    Period  Weeks    Status  On-going      PT LONG TERM GOAL #4   Title  Patient will perform lifting 1 box with 5 pound weight to shoulder level without increase of shoulder pain indicating improved ability to perform lifting in daily routine.     Baseline  07/31/17: Patient demonstrated ability to lift box, however reported slight increase in pain with this.     Time  8    Period  Weeks    Status  On-going      PT LONG TERM GOAL #5   Title  Patient will report no greater than a 3/10 pain in either her left shoulder or back over the course of a 1 week period indicating improved tolerance to daily activities.     Baseline  07/31/17: Patient reported no greater than a 3/10 pain over the course of the last week.     Time  8    Period  Weeks    Status  Achieved            Plan - 07/31/17 1503    Clinical Impression Statement  This session performed a re-assessment of patient's goals. Patient achieved 2 out of 4 of her short term goals and 1 out of 5 of her long term goals. Patient's shoulder ROM and strength continued to remain about the same. Patient did report a decrease in her pain at this assessment that she feels therapy helped with. At this time, therapist discussed with patient that due to a lack of progress with ROM and strength patient would be discharged from therapy at this time. Therapist provided patient with an updated HEP, information on the local senior center, fall prevention tips, and on following up with her physician with any continued concerns. Therapist explained that continuing to perform exercises at home, could continue to assist patient with improving her ROM and strength and maintain her decrease in pain. Patient was also provided with information on how to contact the clinic with any questions or concerns in the future.      Rehab Potential  Fair    Clinical Impairments Affecting Rehab Potential  Positive: Patient's motivation; Negative:  comorbidities, chronicity of pain    PT Frequency  2x / week    PT Duration  8 weeks    PT Treatment/Interventions  ADLs/Self Care Home Management;Aquatic Therapy;Cryotherapy;Electrical Stimulation;Moist Heat;DME Instruction;Gait training;Stair training;Functional mobility training;Therapeutic activities;Therapeutic exercise;Balance training;Neuromuscular re-education;Patient/family education;Manual techniques;Passive range of motion;Dry needling;Energy conservation;Taping    PT Next Visit Plan  Patient discharged.     PT Home Exercise Plan  06/07/17: Wall walk left upper extremity 1 x 15 5 second holds 1x/day; Pec stretch 5 x 20 seconds 2x/day each upper extremity; Shoulder extension/rows/scapular retraction with RTB 2 x 10 bilateral upper extremities 1x/day, 06/21/17; decompression exercise; 07/10/17: Sit to stands from chair 2 x 10 3x/week. 07/17/17: Table slides x 15 for 15 second holds each upper extremity 1 x/day. 07/31/17: Bridges 2 x 10 3x/week.  [place] [facility] and [facility] isometrics 10 x 10'' 1x/day.     Consulted and Agree with Plan of Care  Patient       Patient will benefit from skilled therapeutic intervention in order to improve the following deficits and impairments:  Increased fascial restricitons, Improper body mechanics, Pain, Decreased mobility, Postural dysfunction, Decreased activity tolerance, Decreased endurance, Decreased range of motion, Decreased strength, Hypomobility, Impaired UE functional use, Decreased balance, Difficulty walking  Visit Diagnosis: Left shoulder pain, unspecified chronicity  Pain in thoracic spine  Other symptoms and  signs involving the musculoskeletal system  Stiffness of left shoulder, not elsewhere classified  Muscle weakness (generalized)     Problem List Patient Active Problem List   Diagnosis Date Noted  . Acute colitis 12/02/2016  . Uncontrolled hypertension 12/02/2016  . Enteritis 12/02/2016  . Hyponatremia 12/02/2016  . Status post total  shoulder arthroplasty, left 11/21/2016  . COPD (chronic obstructive pulmonary disease) ([place]) 11/21/2016  . Weakness 11/19/2016  . DJD of left shoulder 11/15/2016  . DOE (dyspnea on exertion) 11/11/2016  . Rotator cuff arthropathy of left shoulder 10/30/2016  . Intertrochanteric fracture of right hip ([place]) 08/01/2015    Class: Acute  . SMA stenosis ([place]) 09/15/2014  . Abdominal pain, right upper quadrant 09/15/2014  . Essential hypertension 09/15/2014  . Depression 09/15/2014  . Hypothyroid 09/15/2014  . GERD without esophagitis 09/15/2014  . Lumbago 11/12/2013  . Stiffness of joint, not elsewhere classified, pelvic region and thigh 11/12/2013  . Muscle weakness (generalized) 11/12/2013  . Lack of coordination 03/21/2013  . Fine motor impairment 03/21/2013  . Left leg weakness 03/17/2013  . Risk for falls 03/17/2013  . Carotid artery disease ([place]) 03/11/2013  . Subclavian artery stenosis ([place]) 03/11/2013  . TIA (transient ischemic attack) 03/11/2013  . Tobacco use disorder 03/11/2013  . CVA (cerebral infarction) 03/05/2013  . Pain in joint, shoulder region 11/01/2010    PHYSICAL THERAPY DISCHARGE SUMMARY  Visits from Start of Care: 15  Current functional level related to goals / functional outcomes: See above   Remaining deficits: See above   Education / Equipment: See above. Updated HEP, fall prevention tips, and about local senior center.  Plan: Patient agrees to discharge.  Patient goals were partially met. Patient is being discharged due to lack of progress.  ?????        Clarene Critchley PT, DPT 3:06 PM, 07/31/17 [place], [place], [address] Phone: [phone]   Fax:  [phone]  Name: MELANYE HIRALDO MRN: [id number] Date of Birth: 1936-01-25

## 2017-07-31 NOTE — Patient Instructions (Signed)
  SHOULDER - ISOMETRIC EXTERNAL ROTATION Gently press your hand into a wall using the back side of your hand. Maintain a bent elbow the entire time. 10 x 10 seconds each arm Repeat 10 Times Hold 10 Seconds Complete 1 Set Perform 1 Time(s) a Day   SHOULDER - ISOMETRIC INTERNAL ROTATION Gently press your hand into a wall using the palm side of your hand. Maintain a bent elbow the entire time. 10 x 10 seconds each arm Repeat 1 Time Hold 1 Second Complete 1 Set Perform 1 Time(s) a Day   BRIDGING While lying on your back with knees bent, tighten your lower abdominals, squeeze your buttocks and then raise your buttocks off the floor/bed as creating a "Bridge" with your body. Hold and then lower yourself and repeat. Repeat 10 Times Hold 1 Second Complete 2 Sets Perform 3 Time(s) a Week

## 2017-08-02 ENCOUNTER — Encounter: Payer: Self-pay | Admitting: Licensed Clinical Social Worker

## 2017-08-02 ENCOUNTER — Other Ambulatory Visit: Payer: Self-pay | Admitting: Licensed Clinical Social Worker

## 2017-08-02 ENCOUNTER — Ambulatory Visit (HOSPITAL_COMMUNITY): Payer: PPO | Admitting: Physical Therapy

## 2017-08-02 NOTE — Patient Outreach (Signed)
Assessment:  CSW spoke via phone with [name]. CSW verified identity of [name]. CSW received verbal permission from [name] for [name] to speak with [name] about client needs and status. Client said that her son transports client to and from client's medical appointments. She said her sons help her obtain needed food items. She said she has prescribed medications and is taking medications as prescribed.  CSW has shared with client information about community resources of assistance for client. Client has met her care plan goal with CSW services. Thus, CSW informed client on 08/02/17, that [name] would discharge client from [name] services on 08/02/17.  Client agreed with this plan and said she had no further social work needs at present. CSW congratulated client on reaching her care plan goal with CSW services.    Plan:  CSW is discharging [name]. [name] from [name] on 08/02/17 since client has met her care plan goal with CSW services.  CSW to fax physician case closure letter to Dr. Luan [name] informing Dr. Luan [name] that [name] discharged client on 08/02/17 from [name] CSW services.   [name].[name] MSW, LCSW Licensed Clinical Social Worker [facility] Care Management [phone]

## 2017-08-14 DIAGNOSIS — J449 Chronic obstructive pulmonary disease, unspecified: Secondary | ICD-10-CM | POA: Diagnosis not present

## 2017-08-14 DIAGNOSIS — E039 Hypothyroidism, unspecified: Secondary | ICD-10-CM | POA: Diagnosis not present

## 2017-08-14 DIAGNOSIS — K55059 Acute (reversible) ischemia of intestine, part and extent unspecified: Secondary | ICD-10-CM | POA: Diagnosis not present

## 2017-08-14 DIAGNOSIS — I10 Essential (primary) hypertension: Secondary | ICD-10-CM | POA: Diagnosis not present

## 2017-08-27 ENCOUNTER — Encounter: Payer: Self-pay | Admitting: Internal Medicine

## 2017-08-27 DIAGNOSIS — J449 Chronic obstructive pulmonary disease, unspecified: Secondary | ICD-10-CM | POA: Diagnosis not present

## 2017-08-27 DIAGNOSIS — E039 Hypothyroidism, unspecified: Secondary | ICD-10-CM | POA: Diagnosis not present

## 2017-08-27 DIAGNOSIS — I10 Essential (primary) hypertension: Secondary | ICD-10-CM | POA: Diagnosis not present

## 2017-08-27 DIAGNOSIS — K55059 Acute (reversible) ischemia of intestine, part and extent unspecified: Secondary | ICD-10-CM | POA: Diagnosis not present

## 2017-09-06 ENCOUNTER — Encounter (HOSPITAL_COMMUNITY)
Admission: RE | Admit: 2017-09-06 | Discharge: 2017-09-06 | Disposition: A | Discharge status: Home / Self Care | Payer: PPO | Source: Ambulatory Visit

## 2017-09-06 ENCOUNTER — Encounter (HOSPITAL_COMMUNITY)
Admission: RE | Admit: 2017-09-06 | Discharge: 2017-09-06 | Disposition: A | Discharge status: Home / Self Care | Payer: PPO | Source: Ambulatory Visit | Attending: Pulmonary Disease | Admitting: Pulmonary Disease

## 2017-09-06 ENCOUNTER — Encounter (HOSPITAL_COMMUNITY): Payer: Self-pay

## 2017-09-06 DIAGNOSIS — M810 Age-related osteoporosis without current pathological fracture: Secondary | ICD-10-CM | POA: Insufficient documentation

## 2017-09-06 MED ORDER — ZOLEDRONIC ACID 5 MG/100ML IV SOLN
5.0000 mg | Freq: Once | INTRAVENOUS | Status: AC
  Administered 2017-09-06: 5 mg via INTRAVENOUS

## 2017-09-06 MED ORDER — SODIUM CHLORIDE 0.9 % IV SOLN
INTRAVENOUS | Status: DC
  Administered 2017-09-06: 12:00:00 via INTRAVENOUS

## 2017-09-06 MED ORDER — ZOLEDRONIC ACID 5 MG/100ML IV SOLN
INTRAVENOUS | Status: AC
  Filled 2017-09-06: qty 100

## 2017-11-05 DIAGNOSIS — J449 Chronic obstructive pulmonary disease, unspecified: Secondary | ICD-10-CM | POA: Diagnosis not present

## 2017-11-05 DIAGNOSIS — F321 Major depressive disorder, single episode, moderate: Secondary | ICD-10-CM | POA: Diagnosis not present

## 2017-11-05 DIAGNOSIS — I10 Essential (primary) hypertension: Secondary | ICD-10-CM | POA: Diagnosis not present

## 2017-11-05 DIAGNOSIS — K55059 Acute (reversible) ischemia of intestine, part and extent unspecified: Secondary | ICD-10-CM | POA: Diagnosis not present

## 2017-11-19 DIAGNOSIS — M19012 Primary osteoarthritis, left shoulder: Secondary | ICD-10-CM | POA: Diagnosis not present

## 2017-11-19 DIAGNOSIS — M25552 Pain in left hip: Secondary | ICD-10-CM | POA: Diagnosis not present

## 2017-11-20 ENCOUNTER — Ambulatory Visit (INDEPENDENT_AMBULATORY_CARE_PROVIDER_SITE_OTHER): Payer: PPO | Admitting: Internal Medicine

## 2017-11-26 ENCOUNTER — Encounter (INDEPENDENT_AMBULATORY_CARE_PROVIDER_SITE_OTHER): Payer: Self-pay | Admitting: Internal Medicine

## 2017-11-26 ENCOUNTER — Ambulatory Visit (INDEPENDENT_AMBULATORY_CARE_PROVIDER_SITE_OTHER): Payer: PPO | Admitting: Internal Medicine

## 2017-11-26 VITALS — BP 128/72 | HR 63 | Temp 97.3°F | Resp 18 | Ht 61.5 in | Wt 101.3 lb

## 2017-11-26 DIAGNOSIS — K551 Chronic vascular disorders of intestine: Secondary | ICD-10-CM | POA: Diagnosis not present

## 2017-11-27 NOTE — Patient Instructions (Signed)
Referral to IR for abdominal angiography to be arranged.

## 2017-11-27 NOTE — Progress Notes (Signed)
IR  Reason for Consultation:    Evaluation of mesenteric ischemia.  HPI:   Patient is 82 year old Caucasian female who was referred through courtesy of Dr. Luan Pulling for GI evaluation. Patient was last seen in our office in October 2018. Patient was initially evaluated in July 2016 for nausea vomiting abdominal pain anorexia and weight loss.  She underwent EGD and colonoscopy.  She had reactive gastropathy and 2 small cecal tubular adenomas.  Abdominopelvic CT reveals extensive atherosclerotic changes to the aorta and visceral arteries.  She was subsequently seen by Dr. Corene Cornea do of [facility].  She underwent balloon angioplasty of celiac trunk.  She is not sure if this intervention helped.  She has continues to experience postprandial abdominal pain which is more or less generalized.  Last year she was evaluated at [facility]  She states she was hospitalized for about 2 weeks and had multiple studies including colonoscopy which revealed small ulcerated ileocecal valve.  Once again her visceral arteries were evaluated and further intervention was not recommended. She states her pain is gotten worse.  She has daily pain.  Pain is worse every time she eats.  She has poor appetite.  She has nausea at least 2-3 times a week and she has at least one episode of postprandial vomiting.  She generally vomits food.  She denies hematemesis or melena.  She also denies heartburn or rectal bleeding.  She has lost 60 pounds in 1 year.  She says she was finally able to quit cigarette smoking 2 weeks ago. She lives alone.  She does house work as much as she can.  She is still drives.  She states her granddaughter comes every week and helps her.  She does not have good appetite. She has 3 sons and they all live out of town.   Past Medical History:  Diagnosis Date  . Anxiety   . Arthritis   . Carotid artery disease ([place]) 2007   R CEA; dopplers 10/2016:  R ICA moderate (non-obstructive  plaque) ; L ICA ~50-69%, but may underestimate. (reassess next with either  CTA versus formal cerebral angiogram)   . Constipation due to pain medication   . COPD (chronic obstructive pulmonary disease) ([place])   . Depression   . Diverticulosis   . Fibromyalgia   . Gastritis   . GERD (gastroesophageal reflux disease)   . Head injury   . History of pneumonia   . History of stroke 10/4707   complication of R SubClav A PTA -- R hemispheric CVA  . Hypertension   . Hypothyroidism   . Osteoarthritis   . Peripheral vascular disease ([place])    Bilateral subclavian artery disease (status post R SubClav A PTA).  R side CEA (with US findings of Mod-Severe L CIA stenosis).  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   . Urgency incontinence     Past Surgical History:  Procedure Laterality Date  . ANTERIOR AND POSTERIOR REPAIR  12/2000   Archie Endo 07/04/2010  . ANTERIOR CERVICAL DECOMP/DISCECTOMY FUSION N/A 04/03/2012   Procedure: ANTERIOR CERVICAL DECOMPRESSION/DISCECTOMY FUSION 2 LEVELS;  Surgeon: Hosie Spangle, MD;  Location: [facility];  Service: Neurosurgery;  Laterality: N/A;  Cervical four-five,Cervical five-six anterior cervical decompression with fusion plating and bonegraft  . BACK SURGERY    . BREAST SURGERY Right 02/2007   Archie Endo 06/23/2010  . BUNIONECTOMY WITH HAMMERTOE RECONSTRUCTION Left 09/10/2007   Archie Endo 06/23/2010  . CAROTID ENDARTERECTOMY Right 2007  [facility], Dr. Lucky Cowboy  . CATARACT EXTRACTION W/ INTRAOCULAR LENS IMPLANT Left    Archie Endo 06/23/2010  . CHOLECYSTECTOMY    . COLONOSCOPY N/A 07/15/2014   Procedure: COLONOSCOPY;  Surgeon: Rogene Houston, MD;  [facility];  Service: Endoscopy;  Laterality: N/A;  200 - moved to 2:10 - Ann to notify pt  . COMPRESSION HIP SCREW Right 08/01/2015   Procedure: COMPRESSION HIP;  Surgeon: Carole Civil, MD;  Location: [facility];  Service: Orthopedics;  Laterality: Right;  . ESOPHAGOGASTRODUODENOSCOPY N/A 07/15/2014    Procedure: ESOPHAGOGASTRODUODENOSCOPY (EGD);  Surgeon: Rogene Houston, MD;  [facility];  Service: Endoscopy;  Laterality: N/A;  . EYE SURGERY     cataract /lens both eyes  . FRACTURE SURGERY    . INCONTINENCE SURGERY  12/2000   Tension-free transvaginal tape procedure/notes 07/04/2010  . JOINT REPLACEMENT    . LUMBAR FUSION  08/2006   Archie Endo 06/23/2010  . PERIPHERAL VASCULAR BALLOON ANGIOPLASTY  02/2013   Aortic arch angiography revealed 70% innominate/R subclavian stenosis along with 60% left common carotid ostial stenosis and moderate left subclavian artery stenosis. ->  Innominate artery PTA with 7 mm balloon reducing stenosis to roughly 30%.  Marland Kitchen PERIPHERAL VASCULAR CATHETERIZATION N/A 10/08/2014   Procedure: Visceral Angiography;  Surgeon: Algernon Huxley, MD;  Location: [facility];  Service: Cardiovascular;  - long stenosis/occlusion of SMA. 75% celiac artery (PTA with 6 mm balloon)  . PERIPHERAL VASCULAR CATHETERIZATION N/A 10/08/2014   Procedure: Visceral Artery Intervention;  Surgeon: Algernon Huxley, MD;  Location: [facility];  Service: Cardiovascular;: 6 mm PTA balloon to stenosed CELIAC ARTERY  . ROTATOR CUFF REPAIR Left X 3  . TOTAL KNEE ARTHROPLASTY Bilateral 2005; 2011   right; left  . TOTAL SHOULDER ARTHROPLASTY Left 11/15/2016  . TOTAL SHOULDER ARTHROPLASTY Left 11/15/2016   Procedure: LEFT TOTAL SHOULDER ARTHROPLASTY;  Surgeon: [name], MD;  Location: [facility];  Service: Orthopedics;  Laterality: Left;  . TRANSTHORACIC ECHOCARDIOGRAM  10/2016   Normal LV wall thickness with LVEF 60-65% and grade 1 diastolic dysfunction.  Aortic valve calcific sclerosis with no stenosis.  Mildly calcified mitral annulus and leaflets.   Marland Kitchen VAGINAL HYSTERECTOMY  12/2000   Archie Endo 07/04/2010    Prior to Admission medications   Medication Sig Start Date End Date Taking? Authorizing Provider  Ascorbic Acid (VITAMIN C) 1000 MG tablet Take 1,000 mg by mouth 2 (two) times  daily.    Yes [provider]  diphenhydrAMINE (BENADRYL) 25 mg capsule Take 25 mg by mouth at bedtime.   Yes [provider]  DULoxetine (CYMBALTA) 30 MG capsule Take 30 mg by mouth daily.    Yes [provider]  Multiple Vitamin (MULTIVITAMIN WITH MINERALS) TABS Take 1 tablet by mouth daily.   Yes [provider]  niacin 500 MG CR capsule Take 500 mg by mouth at bedtime.   Yes [provider]  ondansetron (ZOFRAN) 4 MG tablet Take 4 mg by mouth as needed for nausea or vomiting.   Yes [provider]  diltiazem (CARDIZEM CD) 240 MG 24 hr capsule Take 1 capsule (240 mg total) by mouth daily. 04/17/17 07/16/17  Leonie Man, MD  levothyroxine (SYNTHROID, LEVOTHROID) 88 MCG tablet Take 1 tablet (88 mcg total) by mouth daily. Patient not taking: Reported on 11/26/2017 03/12/13   Bary Leriche, PA-C    Current Outpatient Medications  Medication Sig Dispense Refill  . Ascorbic Acid (VITAMIN  C) 1000 MG tablet Take 1,000 mg by mouth 2 (two) times daily.     . diphenhydrAMINE (BENADRYL) 25 mg capsule Take 25 mg by mouth at bedtime.    . DULoxetine (CYMBALTA) 30 MG capsule Take 30 mg by mouth daily.     . Multiple Vitamin (MULTIVITAMIN WITH MINERALS) TABS Take 1 tablet by mouth daily.    . niacin 500 MG CR capsule Take 500 mg by mouth at bedtime.    . ondansetron (ZOFRAN) 4 MG tablet Take 4 mg by mouth as needed for nausea or vomiting.    . diltiazem (CARDIZEM CD) 240 MG 24 hr capsule Take 1 capsule (240 mg total) by mouth daily. 30 capsule 6  . levothyroxine (SYNTHROID, LEVOTHROID) 88 MCG tablet Take 1 tablet (88 mcg total) by mouth daily. (Patient not taking: Reported on 11/26/2017) 30 tablet 1   No current facility-administered medications for this visit.     Allergies as of 11/26/2017 - Review Complete 11/26/2017  Allergen Reaction Noted  . Aleve [naproxen sodium] Hives and Palpitations 10/21/2010  . Hydrocodone Nausea Only 11/06/2016  .  Statins Other (See Comments) 11/06/2016  . Gluten meal  08/03/2015  . Wheat bran  07/31/2015  . Celebrex [celecoxib] Nausea And Vomiting 10/21/2010  . Codeine Nausea And Vomiting 10/21/2010  . Morphine and related Nausea And Vomiting 10/21/2010    Family History  Problem Relation Age of Onset  . Pancreatic cancer Mother   . Prostate cancer Father     Social History   Socioeconomic History  . Marital status: Widowed    Spouse name: Not on file  . Number of children: 3  . Years of education: 39  . Highest education level: Not on file  Occupational History  . 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  Social Needs  . Financial resource strain: Not on file  . Food insecurity:    Worry: Not on file    Inability: Not on file  . Transportation needs:    Medical: Not on file    Non-medical: Not on file  Tobacco Use  . Smoking status: Former Smoker    Packs/day: 0.12    Years: 63.00    Pack years: 7.56    Types: Cigarettes  . Smokeless tobacco: Never Used  Substance and Sexual Activity  . Alcohol use: No    Alcohol/week: 0.0 standard drinks  . Drug use: No  . Sexual activity: Never    Birth control/protection: Abstinence  Lifestyle  . Physical activity:    Days per week: Not on file    Minutes per session: Not on file  . Stress: Not on file  Relationships  . Social connections:    Talks on phone: Not on file    Gets together: Not on file    Attends religious service: Not on file    Active member of club or organization: Not on file    Attends meetings of clubs or organizations: Not on file    Relationship status: Not on file  . Intimate partner violence:    Fear of current or ex partner: Not on file    Emotionally abused: Not on file    Physically abused: Not on file    Forced sexual activity: Not on file  Other Topics Concern  . Not on file  Social History Narrative    She is widowed. She is the mother 9  grandchildren. She lives  alone. She works as a Electrical engineer for [employer]   . She exercises at the [place] pole 45 at 60 minutes of time 3 days a week.   Physical examination: Blood pressure 128/72, pulse 63, temperature (!) 97.3 F (36.3 C), temperature source Oral, resp. rate 18, height 5' 1.5" (1.562 m), weight 101 lb 4.8 oz (45.9 kg).  Patient appears emaciated. Conjunctiva was pink.  Sclerae nonicteric. Oral pharyngeal mucosa is unremarkable.  She has complete upper and partial lower dental plate. Right carotid endarterectomy scar noted. She has a bruit over left carotid. No thyromegaly or lymphadenopathy. Cardiac exam with regular rhythm normal S1 and S2.  No murmur or gallop noted. Auscultation of lungs reveal vesicular breath sounds bilaterally. Abdomen is flat with wrinkled skin suggesting significant weight loss. Bowel sounds are normal.  Faint bruit noted in upper abdomen. On palpation she has diffuse mild tenderness which is somewhat more pronounced in epigastric region.  Left lobe of the liver is palpable.  No masses noted. No peripheral edema or clubbing noted.  Lab data: Lab data from 08/27/2017 WBC 6.3, H&H 14.4 and 41.9 and platelet count 372K. BUN 18, creatinine 0.78 Glucose 98. Serum sodium 138, potassium 4.4, chloride 102, CO2 26 Serum calcium 9.1. Bilirubin 0.5, AP 69, AST 17, ALT 12, total protein 6.3 and albumin 3.9.    Assessment;  Patient is 82 year old Caucasian female with over 60-year history of cigarette smoking known atherosclerotic disease including visceral arteries who underwent balloon angioplasty to the celiac trunk in August 2016 who presents with progressive weight loss and worsening abdominal pain. Her symptoms are typical of abdominal angina. She was last evaluated at [facility] in November 2018 and no intervention was undertaken.  Her progressive symptoms and significant weight loss warrants reevaluation of her visceral collation to  determine if she needs to be stented or in need of other therapy.  However if she has diffuse small vessel disease it may not be amenable to intervention.  She has preserved renal function.  I feel one proceed directly with abdominal angiography with therapeutic intention. Will discuss with IR. He has significant epigastric pain and tenderness therefore could also have peptic ulcer disease.  Recommendations;  Pantoprazole 40 mg p.o. every morning. Ismo 10 mg before each meal. IR consultation for abdominal angiography.    Eila Runyan  11/27/2017, 1:03 AM

## 2017-11-29 ENCOUNTER — Other Ambulatory Visit: Payer: PPO

## 2017-12-05 DIAGNOSIS — K55059 Acute (reversible) ischemia of intestine, part and extent unspecified: Secondary | ICD-10-CM | POA: Diagnosis not present

## 2017-12-05 DIAGNOSIS — I10 Essential (primary) hypertension: Secondary | ICD-10-CM | POA: Diagnosis not present

## 2017-12-05 DIAGNOSIS — F17200 Nicotine dependence, unspecified, uncomplicated: Secondary | ICD-10-CM | POA: Diagnosis not present

## 2017-12-05 DIAGNOSIS — J441 Chronic obstructive pulmonary disease with (acute) exacerbation: Secondary | ICD-10-CM | POA: Diagnosis not present

## 2017-12-11 ENCOUNTER — Ambulatory Visit
Admission: RE | Admit: 2017-12-11 | Discharge: 2017-12-11 | Disposition: A | Discharge status: Home / Self Care | Payer: PPO | Source: Ambulatory Visit | Attending: Internal Medicine | Admitting: Internal Medicine

## 2017-12-11 ENCOUNTER — Encounter: Payer: Self-pay | Admitting: Radiology

## 2017-12-11 ENCOUNTER — Other Ambulatory Visit: Payer: Self-pay | Admitting: Interventional Radiology

## 2017-12-11 DIAGNOSIS — K551 Chronic vascular disorders of intestine: Secondary | ICD-10-CM

## 2017-12-11 HISTORY — PX: IR RADIOLOGIST EVAL & MGMT: IMG5224

## 2017-12-11 NOTE — Consult Note (Signed)
Chief Complaint: Abdominal Pain  Referring Physician(s): Dr. Hildred Laser  PCP: Dr. Velvet Bathe   History of Present Illness: [name] is a 82 y.o. female presenting as a scheduled consultation today to Vascular & Interventional Radiology, kindly referred by Dr. Hildred Laser, for evaluation of symptoms of chronic mesenteric ischemia.   [name] is here by herself today for our interview.    She tells me that she has had ongoing, debilitating abdominal pain since 2016, which she remembers starting after her left shoulder had a problem.  She has been previously treated in 2016 with angioplasty of the celiac artery origin 10/08/2014.  She tells me that she does not remember many changes in her symptoms at the time.  She then, more recently, had an appointment with [facility] (uncertain which service), and she was told that she had colitis.    She tells me she has ongoing, debilitating diffusion abdominal pain, occurring daily, both before meals and after meals.  After meals it is often worse, and she has associated nausea and vomiting occasionally when she eats.  She denies any melena or hematemesis.  She has had unintentional weight-loss ongoing since 2016, previously 160lbs, now 103 lbs.    She denies any prior myocardial infarction.  She tells me she had a stroke at the time of her prior angioplasty, and she feels that she has recovered fully.    She lives along, as a widow, and is very active.  She continues to go to the [facility] for water aerobics M/W/F, and completes her daily tasks without assistance.  She has 3 children.   She was recently treated for bronchitis/PNA, and is feeling better after completing outpatient course of abx. She denies fever/rigors/chills.   CV risk factors:  Long time smoking, starting as a teenager, so at least a 50 pack year history.  She tells me she has tried to quit without success, and is smoking 1/2 pack daily as of today.  HTN, age, known vascular  disease, CVA history  The most recent imaging I have is a CTA October 2018, year ago.      Past Medical History:  Diagnosis Date  . Anxiety   . Arthritis   . Carotid artery disease ([place]) 2007   R CEA; dopplers 10/2016:  R ICA moderate (non-obstructive plaque) ; L ICA ~50-69%, but may underestimate. (reassess next with either  CTA versus formal cerebral angiogram)   . Constipation due to pain medication   . COPD (chronic obstructive pulmonary disease) ([place])   . Depression   . Diverticulosis   . Fibromyalgia   . Gastritis   . GERD (gastroesophageal reflux disease)   . Head injury   . History of pneumonia   . History of stroke [id number]   complication of R SubClav A PTA -- R hemispheric CVA  . Hypertension   . Hypothyroidism   . Osteoarthritis   . Peripheral vascular disease ([place])    Bilateral subclavian artery disease (status post R SubClav A PTA).  R side CEA (with US findings of Mod-Severe L CIA stenosis).  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   . Urgency incontinence     Past Surgical History:  Procedure Laterality Date  . ANTERIOR AND POSTERIOR REPAIR  12/2000   Archie Endo 07/04/2010  . ANTERIOR CERVICAL DECOMP/DISCECTOMY FUSION N/A 04/03/2012   Procedure: ANTERIOR CERVICAL DECOMPRESSION/DISCECTOMY FUSION 2 LEVELS;  Surgeon: Hosie Spangle, MD;  Location: [place] [facility];  Service: Neurosurgery;  Laterality: N/A;  Cervical four-five,Cervical five-six anterior cervical decompression with fusion plating and bonegraft  . BACK SURGERY    . BREAST SURGERY Right 02/2007   Archie Endo 06/23/2010  . BUNIONECTOMY WITH HAMMERTOE RECONSTRUCTION Left 09/10/2007   Archie Endo 06/23/2010  . CAROTID ENDARTERECTOMY Right 2007   [facility], Dr. Lucky Cowboy  . CATARACT EXTRACTION W/ INTRAOCULAR LENS IMPLANT Left    Archie Endo 06/23/2010  . CHOLECYSTECTOMY    . COLONOSCOPY N/A 07/15/2014   Procedure: COLONOSCOPY;  Surgeon: Rogene Houston, MD;  [facility];  Service: Endoscopy;  Laterality: N/A;  200  - moved to 2:10 - Ann to notify pt  . COMPRESSION HIP SCREW Right 08/01/2015   Procedure: COMPRESSION HIP;  Surgeon: Carole Civil, MD;  Location: [facility];  Service: Orthopedics;  Laterality: Right;  . ESOPHAGOGASTRODUODENOSCOPY N/A 07/15/2014   Procedure: ESOPHAGOGASTRODUODENOSCOPY (EGD);  Surgeon: Rogene Houston, MD;  [facility];  Service: Endoscopy;  Laterality: N/A;  . EYE SURGERY     cataract /lens both eyes  . FRACTURE SURGERY    . INCONTINENCE SURGERY  12/2000   Tension-free transvaginal tape procedure/notes 07/04/2010  . IR RADIOLOGIST EVAL & MGMT  12/11/2017  . JOINT REPLACEMENT    . LUMBAR FUSION  08/2006   Archie Endo 06/23/2010  . PERIPHERAL VASCULAR BALLOON ANGIOPLASTY  02/2013   Aortic arch angiography revealed 70% innominate/R subclavian stenosis along with 60% left common carotid ostial stenosis and moderate left subclavian artery stenosis. ->  Innominate artery PTA with 7 mm balloon reducing stenosis to roughly 30%.  Marland Kitchen PERIPHERAL VASCULAR CATHETERIZATION N/A 10/08/2014   Procedure: Visceral Angiography;  Surgeon: Algernon Huxley, MD;  Location: [place] CV LAB;  Service: Cardiovascular;  - long stenosis/occlusion of SMA. 75% celiac artery (PTA with 6 mm balloon)  . PERIPHERAL VASCULAR CATHETERIZATION N/A 10/08/2014   Procedure: Visceral Artery Intervention;  Surgeon: Algernon Huxley, MD;  Location: [place] CV LAB;  Service: Cardiovascular;: 6 mm PTA balloon to stenosed CELIAC ARTERY  . ROTATOR CUFF REPAIR Left X 3  . TOTAL KNEE ARTHROPLASTY Bilateral 2005; 2011   right; left  . TOTAL SHOULDER ARTHROPLASTY Left 11/15/2016  . TOTAL SHOULDER ARTHROPLASTY Left 11/15/2016   Procedure: LEFT TOTAL SHOULDER ARTHROPLASTY;  Surgeon: [name], MD;  Location: [place];  Service: Orthopedics;  Laterality: Left;  . TRANSTHORACIC ECHOCARDIOGRAM  10/2016   Normal LV wall thickness with LVEF 60-65% and grade 1 diastolic dysfunction.  Aortic valve calcific sclerosis with no  stenosis.  Mildly calcified mitral annulus and leaflets.   Marland Kitchen VAGINAL HYSTERECTOMY  12/2000   Archie Endo 07/04/2010    Allergies: Aleve [naproxen sodium]; Hydrocodone; Statins; Gluten meal; Wheat bran; Celebrex [celecoxib]; Codeine; and Morphine and related  Medications: Prior to Admission medications   Medication Sig Start Date End Date Taking? Authorizing Provider  Ascorbic Acid (VITAMIN C) 1000 MG tablet Take 1,000 mg by mouth 2 (two) times daily.     [provider]  diltiazem (CARDIZEM CD) 240 MG 24 hr capsule Take 1 capsule (240 mg total) by mouth daily. 04/17/17 07/16/17  Leonie Man, MD  diphenhydrAMINE (BENADRYL) 25 mg capsule Take 25 mg by mouth at bedtime.    [provider]  DULoxetine (CYMBALTA) 30 MG capsule Take 30 mg by mouth daily.     [provider]  levothyroxine (SYNTHROID, LEVOTHROID) 88 MCG tablet Take 1 tablet (88 mcg total) by mouth daily. Patient not taking: Reported on 11/26/2017 03/12/13   Bary Leriche,  PA-C  Multiple Vitamin (MULTIVITAMIN WITH MINERALS) TABS Take 1 tablet by mouth daily.    [provider]  niacin 500 MG CR capsule Take 500 mg by mouth at bedtime.    [provider]  ondansetron (ZOFRAN) 4 MG tablet Take 4 mg by mouth as needed for nausea or vomiting.    [provider]     Family History  Problem Relation Age of Onset  . Pancreatic cancer Mother   . Prostate cancer Father     Social History   Socioeconomic History  . Marital status: Widowed    Spouse name: Not on file  . Number of children: 3  . Years of education: 25  . Highest education level: Not on file  Occupational History  . 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  Social Needs  . Financial resource strain: Not on file  . Food insecurity:    Worry: Not on file    Inability: Not on file  . Transportation needs:    Medical: Not on file    Non-medical: Not on file    Tobacco Use  . Smoking status: Former Smoker    Packs/day: 0.12    Years: 63.00    Pack years: 7.56    Types: Cigarettes  . Smokeless tobacco: Never Used  Substance and Sexual Activity  . Alcohol use: No    Alcohol/week: 0.0 standard drinks  . Drug use: No  . Sexual activity: Never    Birth control/protection: Abstinence  Lifestyle  . Physical activity:    Days per week: Not on file    Minutes per session: Not on file  . Stress: Not on file  Relationships  . Social connections:    Talks on phone: Not on file    Gets together: Not on file    Attends religious service: Not on file    Active member of club or organization: Not on file    Attends meetings of clubs or organizations: Not on file    Relationship status: Not on file  Other Topics Concern  . Not on file  Social History Narrative    She is widowed. She is the mother 34 grandchildren. She lives alone. She works as a Electrical engineer for [employer]   . She exercises at the [facility] pole 45 at 60 minutes of time 3 days a week.    Review of Systems: A 12 point ROS discussed and pertinent positives are indicated in the HPI above.  All other systems are negative.  Review of Systems  Vital Signs: BP (!) 146/61   Pulse 82   Temp 97.9 F (36.6 C) (Oral)   Resp 17   Ht 5' 1.5" (1.562 m)   Wt 46.7 kg   SpO2 94%   BMI 19.15 kg/m   Physical Exam General: 82 yo female appearing stated age.  Slight build, with scaphoid abd.  Dowager's.  No distress. HEENT: Atraumatic, normocephalic.  Conjugate gaze, extra-ocular motor intact. No scleral icterus or scleral injection. No lesions on external ears, nose, lips, or gums.  Oral mucosa moist, pink.  Neck: Symmetric with no goiter enlargement.  Chest/Lungs:  Symmetric chest with inspiration/expiration.  No labored breathing.  Clear to auscultation with end expiratory wheeze.  Heart:  RRR, with no third heart sounds appreciated. No JVD appreciated.  Abdomen:  Scaphoid. Soft,  NT/ND, with + bowel sounds. No bruit appreciated.  Genito-urinary: Deferred  Neurologic: Alert & Oriented to person, place, and time.   Normal affect and insight.  Appropriate questions.  Moving all 4 extremities with gross sensory intact.  Pulse Exam:  No bruit appreciated.  No palpable pulsatile abdominal mass.  Palpable right CFA & left CFA.  Weakly palpable bilateral PT and DP.    Extremities: Telangiectasias of bilateral lower extremities.  No wound. No swelling.   Mallampati Score:  2 Imaging: Ir Radiologist Eval & Mgmt  Result Date: 12/11/2017 Please refer to notes tab for details about interventional procedure. (Op Note)   Labs:  CBC: No results for input(s): WBC, HGB, HCT, PLT in the last 8760 hours.  COAGS: No results for input(s): INR, APTT in the last 8760 hours.  BMP: No results for input(s): NA, K, CL, CO2, GLUCOSE, BUN, CALCIUM, CREATININE, GFRNONAA, GFRAA in the last 8760 hours.  Invalid input(s): CMP  LIVER FUNCTION TESTS: No results for input(s): BILITOT, AST, ALT, ALKPHOS, PROT, ALBUMIN in the last 8760 hours.  TUMOR MARKERS: No results for input(s): AFPTM, CEA, CA199, CHROMGRNA in the last 8760 hours.  Assessment and Plan:  82 year old female with multiple cardiovascular risk factors presents with life-style limiting symptoms of ongoing chronic mesenteric ischemia.    I have reviewed her imaging findings, symptom history with her, and my impression is that there is a high likelihood that her symptoms are attributable to CMI.  I did let her know that the only CT is 82 year old, and things may have changed.  The pertinent findings include very advanced aortic changes, chronic SMA occlusion, and disease at the celiac and IMA origin.    I also did let her know that the baseline therapy for vascular disease is medical therapy, as endorsed by the [facility]. Currently she is not taking aspirin or anti-lipid medication.  I did let her know that she should start 81 mg ASA  daily, and that if we were to treat her, she would most likely need dual anti-platelet medication for 3 months, if not longer.   Regarding risk benefit, I shared with her the known high mortality associated with acute ischemia in the setting of CMI, which in some reports is as high as 70%.  As far as risks of angiogram and angioplasty/stenting, specific risks discussed include: bleeding, infection, contrast reaction, kidney injury, arterial dissection, embolism, need for further surgery/procedure, possible acute mesenteric ischemia, cardiopulmonary collapse, death.    Patient has elected to proceed with endovascular options.   Our plan will be to update her imaging with a CTA of the abdomen/pelvis, as the last was 1 year ago.  Tentative plan to proceed with angiogram, possible pressure measurement, and possible angioplasty/stenting.    Regarding her medical management, maximal medical therapy for reduction of risk factors is indicated as recommended by updated AHA guidelines1.  This includes anti-platelet medication, tight blood glucose control to a HbA1c < 7, tight blood pressure control, maximum-dose HMG-CoA reductase inhibitor (for which she may find another discussion with her doctor useful, as she has an intolerance allergy listed), and smoking cessation.  Smoking cessation was addressed, with strategies including counseling and nicotine replacement.     Annual flu vaccination is also recommended, with Class 1 recommendation1.   Plan: - CTA of the abdomen and pelvis for surgical plan and to see if any changes from prior imaging.  - Tentative plan to proceed with aortic and mesenteric angiogram with possible intervention. -Recommend maximal medical therapy for cardiovascular risk reduction, including starting anti-platelet  therapy. -Recommend initiating smoking cessation measures, with options discussed.  -Annual flu vaccination is recommended in the setting of known PAD, in the absence of  contra-indications.    ___________________________________________________________________   1Morley Kos MD, et al. 2016 AHA/ACC Guideline on the Management of Patients With Lower Extremity Peripheral Artery Disease: Executive Summary: A Report of the American College of Cardiology/American Heart Association Task Force on Clinical Practice Guidelines. J Am Coll Cardiol. 2017 Mar 21;69(11):1465-1508. doi: 10.1016/j.jacc.2016.11.008.   2 - Norgren L, et al. TASC II Working Group. Inter-society consensus for the management of peripheral arterial disease. Int Tressia Miners. 2007 Jun;26(2):81-157. Review. PubMed PMID: 81771165   Thank you for this interesting consult.  I greatly enjoyed meeting [name] and look forward to participating in their care.  A copy of this report was sent to the requesting provider on this date.  Electronically Signed: Corrie [name] 12/11/2017, 3:30 PM   I spent a total of  40 Minutes   in face to face in clinical consultation, greater than 50% of which was counseling/coordinating care for known vascular risk factors, chronic mesenteric ischemia, possible angiogram and possible intervention.

## 2017-12-26 ENCOUNTER — Other Ambulatory Visit: Payer: Self-pay | Admitting: Cardiology

## 2018-01-01 ENCOUNTER — Ambulatory Visit (HOSPITAL_COMMUNITY)
Admission: RE | Admit: 2018-01-01 | Discharge: 2018-01-01 | Disposition: A | Discharge status: Home / Self Care | Payer: PPO | Source: Ambulatory Visit | Attending: Interventional Radiology | Admitting: Interventional Radiology

## 2018-01-01 DIAGNOSIS — I700 Atherosclerosis of aorta: Secondary | ICD-10-CM | POA: Diagnosis not present

## 2018-01-01 DIAGNOSIS — K551 Chronic vascular disorders of intestine: Secondary | ICD-10-CM | POA: Insufficient documentation

## 2018-01-01 DIAGNOSIS — I701 Atherosclerosis of renal artery: Secondary | ICD-10-CM | POA: Diagnosis not present

## 2018-01-01 DIAGNOSIS — I708 Atherosclerosis of other arteries: Secondary | ICD-10-CM | POA: Diagnosis not present

## 2018-01-01 DIAGNOSIS — K55059 Acute (reversible) ischemia of intestine, part and extent unspecified: Secondary | ICD-10-CM | POA: Diagnosis not present

## 2018-01-01 LAB — POCT I-STAT CREATININE: CREATININE: 0.7 mg/dL (ref 0.44–1.00)

## 2018-01-01 MED ORDER — IOPAMIDOL (ISOVUE-370) INJECTION 76%
100.0000 mL | Freq: Once | INTRAVENOUS | Status: AC | PRN
  Administered 2018-01-01: 100 mL via INTRAVENOUS

## 2018-01-10 ENCOUNTER — Other Ambulatory Visit (HOSPITAL_COMMUNITY): Payer: Self-pay | Admitting: Interventional Radiology

## 2018-01-10 DIAGNOSIS — K551 Chronic vascular disorders of intestine: Secondary | ICD-10-CM

## 2018-01-17 ENCOUNTER — Other Ambulatory Visit: Payer: Self-pay | Admitting: Radiology

## 2018-01-21 ENCOUNTER — Other Ambulatory Visit: Payer: Self-pay

## 2018-01-21 ENCOUNTER — Ambulatory Visit (HOSPITAL_COMMUNITY)
Admission: RE | Admit: 2018-01-21 | Discharge: 2018-01-21 | Disposition: A | Discharge status: Home / Self Care | Payer: PPO | Source: Ambulatory Visit | Attending: Interventional Radiology | Admitting: Interventional Radiology

## 2018-01-21 ENCOUNTER — Other Ambulatory Visit (HOSPITAL_COMMUNITY): Payer: Self-pay | Admitting: Interventional Radiology

## 2018-01-21 DIAGNOSIS — I10 Essential (primary) hypertension: Secondary | ICD-10-CM | POA: Insufficient documentation

## 2018-01-21 DIAGNOSIS — Z8673 Personal history of transient ischemic attack (TIA), and cerebral infarction without residual deficits: Secondary | ICD-10-CM | POA: Insufficient documentation

## 2018-01-21 DIAGNOSIS — I739 Peripheral vascular disease, unspecified: Secondary | ICD-10-CM | POA: Diagnosis not present

## 2018-01-21 DIAGNOSIS — Z79899 Other long term (current) drug therapy: Secondary | ICD-10-CM | POA: Insufficient documentation

## 2018-01-21 DIAGNOSIS — I774 Celiac artery compression syndrome: Secondary | ICD-10-CM | POA: Diagnosis not present

## 2018-01-21 DIAGNOSIS — M797 Fibromyalgia: Secondary | ICD-10-CM | POA: Diagnosis not present

## 2018-01-21 DIAGNOSIS — Z955 Presence of coronary angioplasty implant and graft: Secondary | ICD-10-CM | POA: Diagnosis not present

## 2018-01-21 DIAGNOSIS — K219 Gastro-esophageal reflux disease without esophagitis: Secondary | ICD-10-CM | POA: Diagnosis not present

## 2018-01-21 DIAGNOSIS — K551 Chronic vascular disorders of intestine: Secondary | ICD-10-CM

## 2018-01-21 DIAGNOSIS — M1990 Unspecified osteoarthritis, unspecified site: Secondary | ICD-10-CM | POA: Insufficient documentation

## 2018-01-21 DIAGNOSIS — J449 Chronic obstructive pulmonary disease, unspecified: Secondary | ICD-10-CM | POA: Insufficient documentation

## 2018-01-21 DIAGNOSIS — Z87891 Personal history of nicotine dependence: Secondary | ICD-10-CM | POA: Diagnosis not present

## 2018-01-21 DIAGNOSIS — E039 Hypothyroidism, unspecified: Secondary | ICD-10-CM | POA: Diagnosis not present

## 2018-01-21 DIAGNOSIS — I700 Atherosclerosis of aorta: Secondary | ICD-10-CM | POA: Diagnosis not present

## 2018-01-21 DIAGNOSIS — Z7982 Long term (current) use of aspirin: Secondary | ICD-10-CM | POA: Diagnosis not present

## 2018-01-21 HISTORY — PX: IR ANGIOGRAM VISCERAL SELECTIVE: IMG657

## 2018-01-21 HISTORY — PX: IR PTA NON CORO-LOWER EXTREM: IMG6142

## 2018-01-21 HISTORY — PX: IR US GUIDE VASC ACCESS RIGHT: IMG2390

## 2018-01-21 HISTORY — PX: IR ANGIOGRAM SELECTIVE EACH ADDITIONAL VESSEL: IMG667

## 2018-01-21 HISTORY — PX: IR TRANSCATH PLC STENT 1ST ART NOT LE CV CAR VERT CAR: IMG5443

## 2018-01-21 LAB — CBC
HCT: 45.2 % (ref 36.0–46.0)
Hemoglobin: 14.7 g/dL (ref 12.0–15.0)
MCH: 30.9 pg (ref 26.0–34.0)
MCHC: 32.5 g/dL (ref 30.0–36.0)
MCV: 95.2 fL (ref 80.0–100.0)
Platelets: 446 10*3/uL — ABNORMAL HIGH (ref 150–400)
RBC: 4.75 MIL/uL (ref 3.87–5.11)
RDW: 13.2 % (ref 11.5–15.5)
WBC: 11.6 10*3/uL — ABNORMAL HIGH (ref 4.0–10.5)
nRBC: 0 % (ref 0.0–0.2)

## 2018-01-21 LAB — BASIC METABOLIC PANEL
Anion gap: 11 (ref 5–15)
BUN: 15 mg/dL (ref 8–23)
CO2: 29 mmol/L (ref 22–32)
Calcium: 9 mg/dL (ref 8.9–10.3)
Chloride: 91 mmol/L — ABNORMAL LOW (ref 98–111)
Creatinine, Ser: 0.78 mg/dL (ref 0.44–1.00)
GFR calc Af Amer: >60 mL/min (ref >60)
GFR calc non Af Amer: >60 mL/min (ref >60)
Glucose, Bld: 126 mg/dL — ABNORMAL HIGH (ref 70–99)
Potassium: 3.8 mmol/L (ref 3.5–5.1)
Sodium: 131 mmol/L — ABNORMAL LOW (ref 135–145)

## 2018-01-21 LAB — PROTIME-INR
INR: 0.97
Prothrombin Time: 12.8 seconds (ref 11.4–15.2)

## 2018-01-21 MED ORDER — HEPARIN BOLUS VIA INFUSION
3000.0000 [IU] | Freq: Once | INTRAVENOUS | Status: DC
  Filled 2018-01-21: qty 3000

## 2018-01-21 MED ORDER — LIDOCAINE HCL (PF) 1 % IJ SOLN
INTRAMUSCULAR | Status: AC | PRN
  Administered 2018-01-21: 10 mL

## 2018-01-21 MED ORDER — MIDAZOLAM HCL 2 MG/2ML IJ SOLN
INTRAMUSCULAR | Status: AC
  Filled 2018-01-21: qty 2

## 2018-01-21 MED ORDER — FENTANYL CITRATE (PF) 100 MCG/2ML IJ SOLN
INTRAMUSCULAR | Status: AC
  Filled 2018-01-21: qty 2

## 2018-01-21 MED ORDER — LIDOCAINE HCL 1 % IJ SOLN
INTRAMUSCULAR | Status: AC
  Filled 2018-01-21: qty 20

## 2018-01-21 MED ORDER — HEPARIN SODIUM (PORCINE) 1000 UNIT/ML IJ SOLN
5000.0000 [IU] | Freq: Once | INTRAMUSCULAR | Status: AC
  Administered 2018-01-21: 5000 [IU] via INTRAVENOUS

## 2018-01-21 MED ORDER — PROTAMINE SULFATE 10 MG/ML IV SOLN
50.0000 mg | Freq: Once | INTRAVENOUS | Status: DC
  Filled 2018-01-21: qty 5

## 2018-01-21 MED ORDER — SODIUM CHLORIDE 0.9 % IV SOLN: INTRAVENOUS | Status: DC

## 2018-01-21 MED ORDER — CEFAZOLIN SODIUM-DEXTROSE 2-4 GM/100ML-% IV SOLN
INTRAVENOUS | Status: AC
  Administered 2018-01-21: 2 g via INTRAVENOUS
  Filled 2018-01-21: qty 100

## 2018-01-21 MED ORDER — PROTAMINE SULFATE 10 MG/ML IV SOLN
INTRAVENOUS | Status: AC | PRN
  Administered 2018-01-21: 30 mg via INTRAVENOUS

## 2018-01-21 MED ORDER — IOPAMIDOL (ISOVUE-300) INJECTION 61%
INTRAVENOUS | Status: AC
  Administered 2018-01-21: 180 mL
  Filled 2018-01-21: qty 100

## 2018-01-21 MED ORDER — MIDAZOLAM HCL 2 MG/2ML IJ SOLN
INTRAMUSCULAR | Status: AC | PRN
  Administered 2018-01-21: 0.5 mg via INTRAVENOUS
  Administered 2018-01-21: 1 mg via INTRAVENOUS
  Administered 2018-01-21 (×2): 0.5 mg via INTRAVENOUS

## 2018-01-21 MED ORDER — CLOPIDOGREL BISULFATE 75 MG PO TABS
300.0000 mg | ORAL_TABLET | Freq: Once | ORAL | Status: AC
  Administered 2018-01-21: 300 mg via ORAL
  Filled 2018-01-21: qty 4

## 2018-01-21 MED ORDER — IOPAMIDOL (ISOVUE-300) INJECTION 61%
INTRAVENOUS | Status: AC
  Filled 2018-01-21: qty 100

## 2018-01-21 MED ORDER — FENTANYL CITRATE (PF) 100 MCG/2ML IJ SOLN
INTRAMUSCULAR | Status: AC | PRN
  Administered 2018-01-21 (×2): 25 ug via INTRAVENOUS
  Administered 2018-01-21: 50 ug via INTRAVENOUS
  Administered 2018-01-21: 25 ug via INTRAVENOUS

## 2018-01-21 MED ORDER — SODIUM CHLORIDE 0.9 % IV SOLN: INTRAVENOUS | Status: AC

## 2018-01-21 MED ORDER — CEFAZOLIN SODIUM-DEXTROSE 2-4 GM/100ML-% IV SOLN
2.0000 g | Freq: Once | INTRAVENOUS | Status: AC
  Administered 2018-01-21: 2 g via INTRAVENOUS

## 2018-01-21 MED ORDER — HEPARIN SODIUM (PORCINE) 1000 UNIT/ML IJ SOLN
2000.0000 [IU] | Freq: Once | INTRAMUSCULAR | Status: AC
  Administered 2018-01-21: 2000 [IU] via INTRAVENOUS

## 2018-01-21 MED ORDER — ASPIRIN 81 MG PO CHEW
324.0000 mg | CHEWABLE_TABLET | Freq: Once | ORAL | Status: AC
  Administered 2018-01-21: 324 mg via ORAL
  Filled 2018-01-21: qty 4

## 2018-01-21 MED ORDER — HEPARIN SODIUM (PORCINE) 1000 UNIT/ML IJ SOLN
INTRAMUSCULAR | Status: AC
  Administered 2018-01-21: 3000 [IU]
  Filled 2018-01-21: qty 1

## 2018-01-21 NOTE — Sedation Documentation (Signed)
PAP at 1646 was 167/54 (95)

## 2018-01-21 NOTE — Procedures (Signed)
Interventional Radiology Procedure Note  Procedure:  Aortic and mesenteric angiogram.  US guided right CFA puncture.  Balloon angioplasty of Celiac artery stenosis, for treatment of pressure gradient >106mmHg. No residual gradient.   Incidental deployment of balloon expandable Palmaz blue stent, 22mm x 89mm, dilated to 55mm, in the right external iliac artery, as a solution to spend the stent which was displaced at the celiac artery origin, and was threatening embolization.     Complications: None  Recommendations:  - OK to advance diet - right hip straight until 8:50pm, then DC home - routine wound care - start plavix 75mg  daily x 90 days - return to dr Maritza Hosterman in ~4-6 weeks for post check, with mesenteric duplex exam (NPO status) in the [facility] - continue max medical therapy, including continuing try for smoking cessation - Do not submerge for 7 days - Routine wound care   Signed,  Dulcy Fanny. Earleen Newport, DO

## 2018-01-21 NOTE — Sedation Documentation (Signed)
PAP at 1439 was 158/49 (92)

## 2018-01-21 NOTE — Discharge Instructions (Signed)

## 2018-01-21 NOTE — Sedation Documentation (Addendum)
PAP at 1425 was 178/50 (99)

## 2018-01-21 NOTE — H&P (Signed)
Chief Complaint: Patient was seen in consultation today for aortic/mesenteric angiogram with possible intervention.   Referring Physician(s): Corrie Mckusick  Supervising Physician: Corrie Mckusick  Patient Status: [facility] - Out-pt  History of Present Illness: [name] is a 82 y.o. female with a past medical history significant for anxiety, arthritis, carotid artery disease, COPD, depression, diverticulosis, fibromyalgia, GERD, CVA (02/2013), HTN, hypothyroidism, PVD and chronic mesenteric ischemia with previous angioplasty of the celiac artery origin (10/08/14). She was seen in IR clinic by Dr. Earleen [place] on 12/11/17 for evaluation of chronic mesenteric ischemia causing daily debilitating abdominal pain that has been present for approximately 3 years. She presents today for a scheduled aortic/mesenteric angiogram with possible intervention.  Ms. [name] presents with several family members for her procedure today. She reports abdominal pain and back pain which are both chronic and feel about the same as usual. She denies chest pain or trouble breathing. Family members with concerns regarding history of COPD and nausea/vomiting following sedation.   Past Medical History:  Diagnosis Date  . Anxiety   . Arthritis   . Carotid artery disease ([place]) 2007   R CEA; dopplers 10/2016:  R ICA moderate (non-obstructive plaque) ; L ICA ~50-69%, but may underestimate. (reassess next with either  CTA versus formal cerebral angiogram)   . Constipation due to pain medication   . COPD (chronic obstructive pulmonary disease) ([place])   . Depression   . Diverticulosis   . Fibromyalgia   . Gastritis   . GERD (gastroesophageal reflux disease)   . Head injury   . History of pneumonia   . History of stroke 71/6967   complication of R SubClav A PTA -- R hemispheric CVA  . Hypertension   . Hypothyroidism   . Osteoarthritis   . Peripheral vascular disease ([place])    Bilateral subclavian artery disease (status post  R SubClav A PTA).  R side CEA (with US findings of Mod-Severe L CIA stenosis).  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   . Urgency incontinence     Past Surgical History:  Procedure Laterality Date  . ANTERIOR AND POSTERIOR REPAIR  12/2000   Archie Endo 07/04/2010  . ANTERIOR CERVICAL DECOMP/DISCECTOMY FUSION N/A 04/03/2012   Procedure: ANTERIOR CERVICAL DECOMPRESSION/DISCECTOMY FUSION 2 LEVELS;  Surgeon: Hosie Spangle, MD;  Location: [place] [facility]URO ORS;  Service: Neurosurgery;  Laterality: N/A;  Cervical four-five,Cervical five-six anterior cervical decompression with fusion plating and bonegraft  . BACK SURGERY    . BREAST SURGERY Right 02/2007   Archie Endo 06/23/2010  . BUNIONECTOMY WITH HAMMERTOE RECONSTRUCTION Left 09/10/2007   Archie Endo 06/23/2010  . CAROTID ENDARTERECTOMY Right 2007   [facility], Dr. Lucky Cowboy  . CATARACT EXTRACTION W/ INTRAOCULAR LENS IMPLANT Left    Archie Endo 06/23/2010  . CHOLECYSTECTOMY    . COLONOSCOPY N/A 07/15/2014   Procedure: COLONOSCOPY;  Surgeon: Rogene Houston, MD;  [facility];  Service: Endoscopy;  Laterality: N/A;  200 - moved to 2:10 - Ann to notify pt  . COMPRESSION HIP SCREW Right 08/01/2015   Procedure: COMPRESSION HIP;  Surgeon: Carole Civil, MD;  Location: [facility] OR[facility];  Service: Orthopedics;  Laterality: Right;  . ESOPHAGOGASTRODUODENOSCOPY N/A 07/15/2014   Procedure: ESOPHAGOGASTRODUODENOSCOPY (EGD);  Surgeon: Rogene Houston, MD;  [facility];  Service: Endoscopy;  Laterality: N/A;  . EYE SURGERY     cataract /lens both eyes  . FRACTURE SURGERY    . INCONTINENCE SURGERY  12/2000   Tension-free transvaginal tape procedure/notes 07/04/2010  .  IR RADIOLOGIST EVAL & MGMT  12/11/2017  . JOINT REPLACEMENT    . LUMBAR FUSION  08/2006   Archie Endo 06/23/2010  . PERIPHERAL VASCULAR BALLOON ANGIOPLASTY  02/2013   Aortic arch angiography revealed 70% innominate/R subclavian stenosis along with 60% left common carotid ostial stenosis and moderate  left subclavian artery stenosis. ->  Innominate artery PTA with 7 mm balloon reducing stenosis to roughly 30%.  Marland Kitchen PERIPHERAL VASCULAR CATHETERIZATION N/A 10/08/2014   Procedure: Visceral Angiography;  Surgeon: Algernon Huxley, MD;  Location: [place] CV LAB;  Service: Cardiovascular;  - long stenosis/occlusion of SMA. 75% celiac artery (PTA with 6 mm balloon)  . PERIPHERAL VASCULAR CATHETERIZATION N/A 10/08/2014   Procedure: Visceral Artery Intervention;  Surgeon: Algernon Huxley, MD;  Location: [place] CV LAB;  Service: Cardiovascular;: 6 mm PTA balloon to stenosed CELIAC ARTERY  . ROTATOR CUFF REPAIR Left X 3  . TOTAL KNEE ARTHROPLASTY Bilateral 2005; 2011   right; left  . TOTAL SHOULDER ARTHROPLASTY Left 11/15/2016  . TOTAL SHOULDER ARTHROPLASTY Left 11/15/2016   Procedure: LEFT TOTAL SHOULDER ARTHROPLASTY;  Surgeon: [name], MD;  Location: [place];  Service: Orthopedics;  Laterality: Left;  . TRANSTHORACIC ECHOCARDIOGRAM  10/2016   Normal LV wall thickness with LVEF 60-65% and grade 1 diastolic dysfunction.  Aortic valve calcific sclerosis with no stenosis.  Mildly calcified mitral annulus and leaflets.   Marland Kitchen VAGINAL HYSTERECTOMY  12/2000   Archie Endo 07/04/2010    Allergies: Aleve [naproxen sodium]; Hydrocodone; Statins; Gluten meal; Wheat bran; Celebrex [celecoxib]; Codeine; and Morphine and related  Medications: Prior to Admission medications   Medication Sig Start Date End Date Taking? Authorizing Provider  Ascorbic Acid (VITAMIN C) 1000 MG tablet Take 1,000 mg by mouth 2 (two) times daily.    Yes [provider]  aspirin EC 81 MG tablet Take 81 mg by mouth daily.   Yes [provider]  Calcium Carb-Cholecalciferol (CALCIUM 600 + D PO) Take 1-2 tablets by mouth See admin instructions. 2 tabs in the morning, and 1 tab at [facility]   Yes [provider]  diltiazem (CARDIZEM CD) 240 MG 24 hr capsule TAKE 1 CAPSULE(240 MG) BY MOUTH DAILY Patient taking  differently: Take 240 mg by mouth daily.  12/26/17  Yes Leonie Man, MD  diphenhydrAMINE (BENADRYL) 25 mg capsule Take 25 mg by mouth at bedtime.   Yes [provider]  DULoxetine (CYMBALTA) 30 MG capsule Take 30 mg by mouth daily.    Yes [provider]  Flaxseed, Linseed, (FLAXSEED OIL) 1000 MG CAPS Take 1 capsule by mouth 2 (two) times daily.   Yes [provider]  Multiple Vitamin (MULTIVITAMIN WITH MINERALS) TABS Take 1 tablet by mouth daily.   Yes [provider]  niacin 500 MG CR capsule Take 500 mg by mouth at bedtime.   Yes [provider]  Omega-3 1000 MG CAPS Take 1 capsule by mouth daily.   Yes [provider]  pantoprazole (PROTONIX) 40 MG tablet Take 40 mg by mouth daily.   Yes [provider]     Family History  Problem Relation Age of Onset  . Pancreatic cancer Mother   . Prostate cancer Father     Social History   Socioeconomic History  . Marital status: Widowed    Spouse name: Not on file  . Number of children: 3  . Years of education: 36  . Highest education level: Not on file  Occupational History  .  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  Social Needs  . Financial resource strain: Not on file  . Food insecurity:    Worry: Not on file    Inability: Not on file  . Transportation needs:    Medical: Not on file    Non-medical: Not on file  Tobacco Use  . Smoking status: Former Smoker    Packs/day: 0.12    Years: 63.00    Pack years: 7.56    Types: Cigarettes  . Smokeless tobacco: Never Used  Substance and Sexual Activity  . Alcohol use: No    Alcohol/week: 0.0 standard drinks  . Drug use: No  . Sexual activity: Never    Birth control/protection: Abstinence  Lifestyle  . Physical activity:    Days per week: Not on file    Minutes per session: Not on file  . Stress: Not on file  Relationships  . Social connections:    Talks on  phone: Not on file    Gets together: Not on file    Attends religious service: Not on file    Active member of club or organization: Not on file    Attends meetings of clubs or organizations: Not on file    Relationship status: Not on file  Other Topics Concern  . Not on file  Social History Narrative    She is widowed. She is the mother 51 grandchildren. She lives alone. She works as a Electrical engineer for [employer]   . She exercises at the [facility] pole 45 at 60 minutes of time 3 days a week.     Review of Systems: A 12 point ROS discussed and pertinent positives are indicated in the HPI above.  All other systems are negative.  Review of Systems  Constitutional: Positive for appetite change and fatigue. Negative for chills and fever.  Respiratory: Negative for cough and shortness of breath.   Cardiovascular: Negative for chest pain.  Gastrointestinal: Positive for abdominal pain and constipation. Negative for abdominal distention, diarrhea, nausea and vomiting.  Skin: Negative for rash.  Neurological: Negative for dizziness and syncope.  Psychiatric/Behavioral: Negative for confusion.    Vital Signs: BP (!) 186/39   Pulse 75   Temp 97.7 F (36.5 C) (Oral)   Resp 18   Ht 5\' 1"  (1.549 m)   Wt 103 lb (46.7 kg)   SpO2 92%   BMI 19.46 kg/m   Physical Exam  Constitutional: She is oriented to person, place, and time. No distress.  Patient holding abdomen during exam due to pain. Able to talk in full sentences.  HENT:  Head: Normocephalic.  Cardiovascular: Normal rate, regular rhythm and normal heart sounds.  Pulmonary/Chest: Effort normal and breath sounds normal.  Abdominal: Soft. She exhibits no distension. There is tenderness.  Neurological: She is alert and oriented to person, place, and time.  Skin: Skin is warm and dry. She is not diaphoretic.  Psychiatric: She has a normal mood and affect. Her behavior is normal. Judgment and thought content normal.  Nursing note  and vitals reviewed.    MD Evaluation Airway: WNL(full top dentures, partial bottom dentures) Heart: WNL Abdomen: WNL Chest/ Lungs: WNL ASA  Classification: 3 Mallampati/Airway Score: One   Imaging: Ct Angio Abd/pel W/ And/or W/o  Result Date: 01/01/2018 CLINICAL DATA:  Mesenteric ischemia EXAM: CTA ABDOMEN AND PELVIS wITHOUT AND WITH CONTRAST TECHNIQUE: Multidetector CT imaging of the abdomen and pelvis  was performed using the standard protocol during bolus administration of intravenous contrast. Multiplanar reconstructed images and MIPs were obtained and reviewed to evaluate the vascular anatomy. CONTRAST:  174mL ISOVUE-370 IOPAMIDOL (ISOVUE-370) INJECTION 76% COMPARISON:  None. FINDINGS: VASCULAR Aorta: Diffuse atherosclerotic calcifications are noted throughout the abdominal aorta without significant luminal narrowing or aneurysmal dilatation. Celiac: Patent.  Branch vessels patent. SMA: Just beyond the origin, the SMA is occluded. The occlusion extends a length of 3.1 cm. This is stable compare with prior imaging dated 10/22/2014. Beyond the occlusion, the vessel is patent with atherosclerotic calcifications. Renals: 2 right renal arteries are patent with atherosclerotic calcifications of the origin. The left renal artery is also patent with atherosclerotic calcifications at the origin. IMA: Patent.  Branch vessels are grossly patent. Inflow: Diffuse atherosclerotic calcifications are present in the bilateral common and external iliac arteries without significant focal narrowing. Internal iliac arteries are also moderately diseased with atherosclerotic calcification. Proximal Outflow: Grossly patent bilaterally with atherosclerotic calcifications. Veins: No obvious DVT. Review of the MIP images confirms the above findings. NON-VASCULAR Lower chest: No acute abnormality. Hepatobiliary: Postcholecystectomy.  Unremarkable liver. Pancreas: Unremarkable Spleen: Unremarkable Adrenals/Urinary Tract:  There is diffuse prominence of the adrenal glands without focal mass. The kidneys are within normal limits in appearance. Bladder is within normal limits. Stomach/Bowel: There is mild distension in the antrum of the stomach of unknown significance. No obvious mass in the colon. There are dilated small bowel loops in the pelvis with air-fluid levels there is no clear transition point in small bowel and these findings are of unknown significance. Extensive sigmoid diverticulosis without evidence of acute diverticulitis. Lymphatic: No abnormal retroperitoneal adenopathy. Reproductive: Uterus is absent.  Adnexa are within normal limits. Other: No free fluid. Musculoskeletal: There is a chronic appearing compression deformity L1 and T12. There is retropulsion of the inferior endplate of L1. These findings are stable. Postoperative changes in the lumbar spine from decompressive surgery are noted. IMPRESSION: VASCULAR Stable chronic occlusion just beyond the origin of the SMA. Atherosclerotic calcifications elsewhere without significant narrowing. NON-VASCULAR No acute process. Minimally dilated small bowel loops and gastric antrum are likely within normal limits for this patient. Electronically Signed   By: Marybelle Killings M.D.   On: 01/01/2018 17:52    Labs:  CBC: Recent Labs    01/21/18 1137  WBC 11.6*  HGB 14.7  HCT 45.2  PLT 446*    COAGS: Recent Labs    01/21/18 1137  INR 0.97    BMP: Recent Labs    01/01/18 1701  CREATININE 0.70    LIVER FUNCTION TESTS: No results for input(s): BILITOT, AST, ALT, ALKPHOS, PROT, ALBUMIN in the last 8760 hours.  TUMOR MARKERS: No results for input(s): AFPTM, CEA, CA199, CHROMGRNA in the last 8760 hours.  Assessment and Plan:  Patient with history of chronic mesenteric ischemia and daily debilitating abdominal pain for approximately 3 years recently seen by Dr. Earleen [facility] in [place] clinic for possible intervention of her chronic mesenteric ischemia for which  she presents today.   Patient is afebrile, hypertensive on exam although she states this is near baseline for her and she has not taken any blood pressure medication today, last PO intake was a sip of water at 6 am this morning. WBC 11.6, hgb 14.7, INR 0.97, creatinine 0.78.   Risks and benefits of aortic/mesenteric angiogram with possible intervention were discussed with the patient and her family including, but not limited to bleeding, infection, vascular injury or contrast induced renal failure.  This interventional procedure involves the use of X-rays and because of the nature of the planned procedure, it is possible that we will have prolonged use of X-ray fluoroscopy. Potential radiation risks to you include (but are not limited to) the following: - A slightly elevated risk for cancer  several years later in life. This risk is typically less than 0.5% percent. This risk is low in comparison to the normal incidence of human cancer, which is 33% for women and 50% for men according to the [place]. - Radiation induced injury can include skin redness, resembling a rash, tissue breakdown / ulcers and hair loss (which can be temporary or permanent).  The likelihood of either of these occurring depends on the difficulty of the procedure and whether you are sensitive to radiation due to previous procedures, disease, or genetic conditions.  IF your procedure requires a prolonged use of radiation, you will be notified and given written instructions for further action.  It is your responsibility to monitor the irradiated area for the 2 weeks following the procedure and to notify your physician if you are concerned that you have suffered a radiation induced injury.    All of the patient and her family's questions were answered, patient is agreeable to proceed.  Consent signed and in chart.  Thank you for this interesting consult.  I greatly enjoyed meeting EMYAH ROZNOWSKI and look forward  to participating in their care.  A copy of this report was sent to the requesting provider on this date.  Electronically Signed: Joaquim Nam, PA-C 01/21/2018, 12:15 PM   I spent a total of 15 Minutes in face to face in clinical consultation, greater than 50% of which was counseling/coordinating care for aortic/mesenteric angiogram with possible intervention.

## 2018-01-21 NOTE — Progress Notes (Signed)
Ambulated with patient in hallway and to bathroom. No bleeding or hematoma noted

## 2018-01-22 ENCOUNTER — Encounter (HOSPITAL_COMMUNITY): Payer: Self-pay | Admitting: Interventional Radiology

## 2018-01-22 ENCOUNTER — Ambulatory Visit (INDEPENDENT_AMBULATORY_CARE_PROVIDER_SITE_OTHER): Payer: PPO | Admitting: Internal Medicine

## 2018-01-29 ENCOUNTER — Other Ambulatory Visit: Payer: Self-pay | Admitting: Interventional Radiology

## 2018-01-29 DIAGNOSIS — K551 Chronic vascular disorders of intestine: Secondary | ICD-10-CM

## 2018-02-06 ENCOUNTER — Other Ambulatory Visit (HOSPITAL_COMMUNITY): Payer: Self-pay | Admitting: Interventional Radiology

## 2018-02-06 ENCOUNTER — Encounter (HOSPITAL_COMMUNITY): Payer: Self-pay

## 2018-02-06 DIAGNOSIS — K55059 Acute (reversible) ischemia of intestine, part and extent unspecified: Secondary | ICD-10-CM | POA: Diagnosis not present

## 2018-02-06 DIAGNOSIS — M545 Low back pain: Secondary | ICD-10-CM | POA: Diagnosis not present

## 2018-02-06 DIAGNOSIS — K551 Chronic vascular disorders of intestine: Secondary | ICD-10-CM

## 2018-02-06 DIAGNOSIS — I10 Essential (primary) hypertension: Secondary | ICD-10-CM | POA: Diagnosis not present

## 2018-02-06 DIAGNOSIS — J449 Chronic obstructive pulmonary disease, unspecified: Secondary | ICD-10-CM | POA: Diagnosis not present

## 2018-02-28 ENCOUNTER — Other Ambulatory Visit: Payer: PPO

## 2018-03-19 ENCOUNTER — Other Ambulatory Visit: Payer: PPO

## 2018-03-19 ENCOUNTER — Telehealth: Payer: Self-pay | Admitting: *Deleted

## 2018-03-19 DIAGNOSIS — Z0000 Encounter for general adult medical examination without abnormal findings: Secondary | ICD-10-CM | POA: Diagnosis not present

## 2018-03-19 NOTE — Telephone Encounter (Signed)
[name] calls and states she doesn't want to come back to see Dr. Earleen [facility]. She states she has other issues to handle. Doesn't feel its of any use. Dr. Earleen [facility] informed./vm

## 2018-03-22 ENCOUNTER — Encounter (HOSPITAL_COMMUNITY): Payer: Self-pay | Admitting: Interventional Radiology

## 2018-05-27 ENCOUNTER — Telehealth (INDEPENDENT_AMBULATORY_CARE_PROVIDER_SITE_OTHER): Payer: Self-pay | Admitting: Internal Medicine

## 2018-05-27 DIAGNOSIS — K219 Gastro-esophageal reflux disease without esophagitis: Secondary | ICD-10-CM

## 2018-05-27 MED ORDER — PANTOPRAZOLE SODIUM 40 MG PO TBEC: 40.0000 mg | DELAYED_RELEASE_TABLET | Freq: Every day | ORAL | 11 refills | Status: DC

## 2018-05-27 NOTE — Telephone Encounter (Signed)
Rx for Protonix sent to her pharmacy. 

## 2018-06-19 DIAGNOSIS — J301 Allergic rhinitis due to pollen: Secondary | ICD-10-CM | POA: Diagnosis not present

## 2018-06-19 DIAGNOSIS — J441 Chronic obstructive pulmonary disease with (acute) exacerbation: Secondary | ICD-10-CM | POA: Diagnosis not present

## 2018-06-20 DIAGNOSIS — J301 Allergic rhinitis due to pollen: Secondary | ICD-10-CM | POA: Diagnosis not present

## 2018-06-20 DIAGNOSIS — K55059 Acute (reversible) ischemia of intestine, part and extent unspecified: Secondary | ICD-10-CM | POA: Diagnosis not present

## 2018-06-20 DIAGNOSIS — J441 Chronic obstructive pulmonary disease with (acute) exacerbation: Secondary | ICD-10-CM | POA: Diagnosis not present

## 2018-06-21 ENCOUNTER — Other Ambulatory Visit: Payer: Self-pay | Admitting: Cardiology

## 2018-06-21 NOTE — Telephone Encounter (Signed)
Diltiazem CD 240 refilled.

## 2018-06-21 NOTE — Telephone Encounter (Signed)
Cartia XT 240 mg refilled.

## 2018-06-21 NOTE — Telephone Encounter (Signed)
Diltiazem CD 240 mg refilled. 

## 2018-06-24 ENCOUNTER — Telehealth: Payer: Self-pay | Admitting: Orthopedic Surgery

## 2018-06-24 NOTE — Telephone Encounter (Signed)
Patient said "Yes" she can walk, however, there is pain in the groin area when she does walk. Please advise.

## 2018-06-24 NOTE — Telephone Encounter (Signed)
Patient called regarding right hip pain, following a fall "last week" - concerned she may have fractured it.  No treatment as of yet; states saw primary care doctor over a week ago for bronchitis, but not for this. Discussed having patient to go to emergency room for work-up. Or can we do virtual visit? Ph# [phone].

## 2018-06-24 NOTE — Telephone Encounter (Signed)
Can she walk ??  Find out then let me know BEFORE YOU SCHEDULE THE APPT

## 2018-06-26 ENCOUNTER — Other Ambulatory Visit: Payer: Self-pay

## 2018-06-26 ENCOUNTER — Emergency Department (HOSPITAL_COMMUNITY): Payer: PPO

## 2018-06-26 ENCOUNTER — Inpatient Hospital Stay (HOSPITAL_COMMUNITY)
Admission: EM | Admit: 2018-06-26 | Discharge: 2018-07-02 | DRG: 871 | Disposition: A | Discharge status: Home Health | Payer: PPO | Attending: Pulmonary Disease | Admitting: Pulmonary Disease

## 2018-06-26 ENCOUNTER — Encounter (HOSPITAL_COMMUNITY): Payer: Self-pay

## 2018-06-26 DIAGNOSIS — S32591A Other specified fracture of right pubis, initial encounter for closed fracture: Secondary | ICD-10-CM | POA: Diagnosis not present

## 2018-06-26 DIAGNOSIS — I774 Celiac artery compression syndrome: Secondary | ICD-10-CM | POA: Diagnosis not present

## 2018-06-26 DIAGNOSIS — S32501A Unspecified fracture of right pubis, initial encounter for closed fracture: Secondary | ICD-10-CM | POA: Diagnosis not present

## 2018-06-26 DIAGNOSIS — Z20828 Contact with and (suspected) exposure to other viral communicable diseases: Secondary | ICD-10-CM | POA: Diagnosis not present

## 2018-06-26 DIAGNOSIS — S32591G Other specified fracture of right pubis, subsequent encounter for fracture with delayed healing: Secondary | ICD-10-CM

## 2018-06-26 DIAGNOSIS — E039 Hypothyroidism, unspecified: Secondary | ICD-10-CM | POA: Diagnosis not present

## 2018-06-26 DIAGNOSIS — F329 Major depressive disorder, single episode, unspecified: Secondary | ICD-10-CM | POA: Diagnosis present

## 2018-06-26 DIAGNOSIS — J189 Pneumonia, unspecified organism: Secondary | ICD-10-CM | POA: Diagnosis not present

## 2018-06-26 DIAGNOSIS — Y92007 Garden or yard of unspecified non-institutional (private) residence as the place of occurrence of the external cause: Secondary | ICD-10-CM | POA: Diagnosis not present

## 2018-06-26 DIAGNOSIS — Z8673 Personal history of transient ischemic attack (TIA), and cerebral infarction without residual deficits: Secondary | ICD-10-CM

## 2018-06-26 DIAGNOSIS — J440 Chronic obstructive pulmonary disease with acute lower respiratory infection: Secondary | ICD-10-CM | POA: Diagnosis present

## 2018-06-26 DIAGNOSIS — A419 Sepsis, unspecified organism: Secondary | ICD-10-CM | POA: Diagnosis not present

## 2018-06-26 DIAGNOSIS — R52 Pain, unspecified: Secondary | ICD-10-CM | POA: Diagnosis not present

## 2018-06-26 DIAGNOSIS — W010XXA Fall on same level from slipping, tripping and stumbling without subsequent striking against object, initial encounter: Secondary | ICD-10-CM | POA: Diagnosis present

## 2018-06-26 DIAGNOSIS — Z8042 Family history of malignant neoplasm of prostate: Secondary | ICD-10-CM | POA: Diagnosis not present

## 2018-06-26 DIAGNOSIS — J9601 Acute respiratory failure with hypoxia: Secondary | ICD-10-CM | POA: Diagnosis not present

## 2018-06-26 DIAGNOSIS — Z800 Family history of malignant neoplasm of digestive organs: Secondary | ICD-10-CM | POA: Diagnosis not present

## 2018-06-26 DIAGNOSIS — I10 Essential (primary) hypertension: Secondary | ICD-10-CM | POA: Diagnosis not present

## 2018-06-26 DIAGNOSIS — F17200 Nicotine dependence, unspecified, uncomplicated: Secondary | ICD-10-CM | POA: Diagnosis not present

## 2018-06-26 DIAGNOSIS — S32414A Nondisplaced fracture of anterior wall of right acetabulum, initial encounter for closed fracture: Secondary | ICD-10-CM | POA: Diagnosis not present

## 2018-06-26 DIAGNOSIS — R0902 Hypoxemia: Secondary | ICD-10-CM | POA: Diagnosis not present

## 2018-06-26 DIAGNOSIS — F17210 Nicotine dependence, cigarettes, uncomplicated: Secondary | ICD-10-CM | POA: Diagnosis not present

## 2018-06-26 DIAGNOSIS — K559 Vascular disorder of intestine, unspecified: Secondary | ICD-10-CM | POA: Diagnosis present

## 2018-06-26 DIAGNOSIS — J181 Lobar pneumonia, unspecified organism: Secondary | ICD-10-CM | POA: Diagnosis not present

## 2018-06-26 DIAGNOSIS — Z66 Do not resuscitate: Secondary | ICD-10-CM | POA: Diagnosis not present

## 2018-06-26 DIAGNOSIS — Z7982 Long term (current) use of aspirin: Secondary | ICD-10-CM | POA: Diagnosis not present

## 2018-06-26 DIAGNOSIS — R05 Cough: Secondary | ICD-10-CM | POA: Diagnosis not present

## 2018-06-26 DIAGNOSIS — R918 Other nonspecific abnormal finding of lung field: Secondary | ICD-10-CM | POA: Diagnosis not present

## 2018-06-26 HISTORY — DX: Dyspnea, unspecified: R06.00

## 2018-06-26 LAB — C-REACTIVE PROTEIN: CRP: <0.8 mg/dL (ref <1.0)

## 2018-06-26 LAB — COMPREHENSIVE METABOLIC PANEL
ALT: 37 U/L (ref 0–44)
AST: 27 U/L (ref 15–41)
Albumin: 4.2 g/dL (ref 3.5–5.0)
Alkaline Phosphatase: 108 U/L (ref 38–126)
Anion gap: 16 — ABNORMAL HIGH (ref 5–15)
BUN: 21 mg/dL (ref 8–23)
CO2: 28 mmol/L (ref 22–32)
Calcium: 7.6 mg/dL — ABNORMAL LOW (ref 8.9–10.3)
Chloride: 89 mmol/L — ABNORMAL LOW (ref 98–111)
Creatinine, Ser: 0.75 mg/dL (ref 0.44–1.00)
GFR calc Af Amer: >60 mL/min (ref >60)
GFR calc non Af Amer: >60 mL/min (ref >60)
Glucose, Bld: 131 mg/dL — ABNORMAL HIGH (ref 70–99)
Potassium: 3.5 mmol/L (ref 3.5–5.1)
Sodium: 133 mmol/L — ABNORMAL LOW (ref 135–145)
Total Bilirubin: 1.4 mg/dL — ABNORMAL HIGH (ref 0.3–1.2)
Total Protein: 7.5 g/dL (ref 6.5–8.1)

## 2018-06-26 LAB — CBC WITH DIFFERENTIAL/PLATELET
Abs Immature Granulocytes: 0.26 10*3/uL — ABNORMAL HIGH (ref 0.00–0.07)
Basophils Absolute: 0.1 10*3/uL (ref 0.0–0.1)
Basophils Relative: 0 %
Eosinophils Absolute: 0 10*3/uL (ref 0.0–0.5)
Eosinophils Relative: 0 %
HCT: 43.3 % (ref 36.0–46.0)
Hemoglobin: 14.7 g/dL (ref 12.0–15.0)
Immature Granulocytes: 1 %
Lymphocytes Relative: 3 %
Lymphs Abs: 0.7 10*3/uL (ref 0.7–4.0)
MCH: 31 pg (ref 26.0–34.0)
MCHC: 33.9 g/dL (ref 30.0–36.0)
MCV: 91.4 fL (ref 80.0–100.0)
Monocytes Absolute: 1 10*3/uL (ref 0.1–1.0)
Monocytes Relative: 4 %
Neutro Abs: 21.2 10*3/uL — ABNORMAL HIGH (ref 1.7–7.7)
Neutrophils Relative %: 92 %
Platelets: 576 10*3/uL — ABNORMAL HIGH (ref 150–400)
RBC: 4.74 MIL/uL (ref 3.87–5.11)
RDW: 15.4 % (ref 11.5–15.5)
WBC: 23.3 10*3/uL — ABNORMAL HIGH (ref 4.0–10.5)
nRBC: 0 % (ref 0.0–0.2)

## 2018-06-26 LAB — FIBRINOGEN: Fibrinogen: 377 mg/dL (ref 210–475)

## 2018-06-26 LAB — LACTATE DEHYDROGENASE: LDH: 307 U/L — ABNORMAL HIGH (ref 98–192)

## 2018-06-26 LAB — PROCALCITONIN: Procalcitonin: <0.1 ng/mL

## 2018-06-26 LAB — D-DIMER, QUANTITATIVE: D-Dimer, Quant: 3.12 ug/mL-FEU — ABNORMAL HIGH (ref 0.00–0.50)

## 2018-06-26 LAB — TRIGLYCERIDES: Triglycerides: 56 mg/dL (ref <150)

## 2018-06-26 LAB — FERRITIN: Ferritin: 67 ng/mL (ref 11–307)

## 2018-06-26 LAB — LACTIC ACID, PLASMA
Lactic Acid, Venous: 2.3 mmol/L (ref 0.5–1.9)
Lactic Acid, Venous: 1 mmol/L (ref 0.5–1.9)

## 2018-06-26 LAB — SARS CORONAVIRUS 2 BY RT PCR (HOSPITAL ORDER, PERFORMED IN ~~LOC~~ HOSPITAL LAB): SARS Coronavirus 2: NEGATIVE

## 2018-06-26 MED ORDER — NIACIN ER 500 MG PO TBCR
500.0000 mg | EXTENDED_RELEASE_TABLET | Freq: Every day | ORAL | Status: DC
  Administered 2018-06-26 – 2018-07-01 (×6): 500 mg via ORAL
  Filled 2018-06-26 (×7): qty 1

## 2018-06-26 MED ORDER — PANTOPRAZOLE SODIUM 40 MG PO TBEC
40.0000 mg | DELAYED_RELEASE_TABLET | Freq: Every day | ORAL | Status: DC
  Administered 2018-06-26 – 2018-07-02 (×7): 40 mg via ORAL
  Filled 2018-06-26 (×7): qty 1

## 2018-06-26 MED ORDER — HYDRALAZINE HCL 20 MG/ML IJ SOLN
10.0000 mg | Freq: Once | INTRAMUSCULAR | Status: AC
  Administered 2018-06-26: 22:00:00 10 mg via INTRAVENOUS
  Filled 2018-06-26: qty 1

## 2018-06-26 MED ORDER — ENOXAPARIN SODIUM 40 MG/0.4ML ~~LOC~~ SOLN
40.0000 mg | SUBCUTANEOUS | Status: DC
  Administered 2018-06-26 – 2018-07-01 (×6): 40 mg via SUBCUTANEOUS
  Filled 2018-06-26 (×6): qty 0.4

## 2018-06-26 MED ORDER — DILTIAZEM HCL ER COATED BEADS 240 MG PO CP24
240.0000 mg | ORAL_CAPSULE | Freq: Every day | ORAL | Status: DC
  Administered 2018-06-26 – 2018-07-02 (×7): 240 mg via ORAL
  Filled 2018-06-26 (×8): qty 1

## 2018-06-26 MED ORDER — POTASSIUM CHLORIDE IN NACL 20-0.9 MEQ/L-% IV SOLN
INTRAVENOUS | Status: AC
  Administered 2018-06-26 – 2018-06-27 (×2): via INTRAVENOUS

## 2018-06-26 MED ORDER — SODIUM CHLORIDE 0.9 % IV SOLN
1.0000 g | Freq: Once | INTRAVENOUS | Status: AC
  Administered 2018-06-26: 1 g via INTRAVENOUS
  Filled 2018-06-26: qty 10

## 2018-06-26 MED ORDER — ONDANSETRON HCL 4 MG PO TABS: 4.0000 mg | ORAL_TABLET | Freq: Four times a day (QID) | ORAL | Status: DC | PRN

## 2018-06-26 MED ORDER — DULOXETINE HCL 30 MG PO CPEP
30.0000 mg | ORAL_CAPSULE | Freq: Every day | ORAL | Status: DC
  Administered 2018-06-26 – 2018-07-02 (×7): 30 mg via ORAL
  Filled 2018-06-26 (×7): qty 1

## 2018-06-26 MED ORDER — ACETAMINOPHEN 650 MG RE SUPP: 650.0000 mg | Freq: Four times a day (QID) | RECTAL | Status: DC | PRN

## 2018-06-26 MED ORDER — ENSURE ENLIVE PO LIQD
237.0000 mL | Freq: Two times a day (BID) | ORAL | Status: DC
  Administered 2018-06-27: 237 mL via ORAL

## 2018-06-26 MED ORDER — ONDANSETRON HCL 4 MG/2ML IJ SOLN
4.0000 mg | Freq: Four times a day (QID) | INTRAMUSCULAR | Status: DC | PRN
  Administered 2018-06-26 – 2018-06-27 (×3): 4 mg via INTRAVENOUS
  Filled 2018-06-26 (×3): qty 2

## 2018-06-26 MED ORDER — LEVOFLOXACIN IN D5W 500 MG/100ML IV SOLN
500.0000 mg | INTRAVENOUS | Status: DC
  Administered 2018-06-26 – 2018-06-30 (×5): 500 mg via INTRAVENOUS
  Filled 2018-06-26 (×5): qty 100

## 2018-06-26 MED ORDER — ASPIRIN EC 81 MG PO TBEC
81.0000 mg | DELAYED_RELEASE_TABLET | Freq: Every day | ORAL | Status: DC
  Administered 2018-06-26 – 2018-07-02 (×7): 81 mg via ORAL
  Filled 2018-06-26 (×7): qty 1

## 2018-06-26 MED ORDER — ACETAMINOPHEN 325 MG PO TABS
650.0000 mg | ORAL_TABLET | Freq: Four times a day (QID) | ORAL | Status: DC | PRN
  Filled 2018-06-26: qty 2

## 2018-06-26 MED ORDER — OMEGA-3-ACID ETHYL ESTERS 1 G PO CAPS
1.0000 | ORAL_CAPSULE | Freq: Every day | ORAL | Status: DC
  Administered 2018-06-26 – 2018-07-02 (×7): 1 g via ORAL
  Filled 2018-06-26 (×7): qty 1

## 2018-06-26 MED ORDER — VITAMIN C 500 MG PO TABS
1000.0000 mg | ORAL_TABLET | Freq: Two times a day (BID) | ORAL | Status: DC
  Administered 2018-06-26 – 2018-07-02 (×12): 1000 mg via ORAL
  Filled 2018-06-26 (×13): qty 2

## 2018-06-26 MED ORDER — SODIUM CHLORIDE 0.9 % IV BOLUS
1000.0000 mL | Freq: Once | INTRAVENOUS | Status: AC
  Administered 2018-06-26: 1000 mL via INTRAVENOUS

## 2018-06-26 MED ORDER — ADULT MULTIVITAMIN W/MINERALS CH
1.0000 | ORAL_TABLET | Freq: Every day | ORAL | Status: DC
  Administered 2018-06-26 – 2018-07-02 (×7): 1 via ORAL
  Filled 2018-06-26 (×7): qty 1

## 2018-06-26 NOTE — ED Notes (Addendum)
Date and time results received: 06/26/18  1247  Test: Lactic  Critical Value: 2.3  Name of Provider Notified: Alyse Low, PA   Orders Received? Or Actions Taken?: See chart

## 2018-06-26 NOTE — Telephone Encounter (Signed)
Patient called to relay that she is now heading to the Emergency room, which she previously had been hesitant to do; however, states that the pain is ongoing, and feels like it did when she had similar issue in past.  Said she will call back to schedule if emergency room advises.

## 2018-06-26 NOTE — ED Notes (Signed)
ED TO INPATIENT HANDOFF REPORT  ED Nurse Name and Phone #: [phone] Esau Grew Name/Age/Gender Elray Buba 83 y.o. female Room/Bed: APA11/APA11  Code Status   Code Status: Prior  Home/SNF/Other Home Patient oriented to: self, place, time and situation Is this baseline? Yes   Triage Complete: Triage complete  Chief Complaint groin pain  Triage Note Pt reports fell in her yard Friday and c/o pain in r groin.  Reports pain has gotten worse each day.  EMS also found pt's 02 sat 82%.  Reports is not on o2 at home but has COPD.  CBG 179.  PT has cough at this time, says is being treated for allergies.  Reports has been on prednisone and antibiotic for the past week.    Allergies Allergies  Allergen Reactions  . Aleve [Naproxen Sodium] Hives and Palpitations  . Hydrocodone Nausea Only  . Statins Other (See Comments)    Muscle pain  . Gluten Meal   . Wheat Bran     Gluten intolerant  . Celebrex [Celecoxib] Nausea And Vomiting  . Codeine Nausea And Vomiting  . Morphine And Related Nausea And Vomiting    Level of Care/Admitting Diagnosis ED Disposition    ED Disposition Condition [facility] Area: [facility] [[id number]]  Level of Care: Med-Surg [16]  Covid Evaluation: N/A  Diagnosis: Sepsis due to undetermined organism [facility]) [[id number]]  Admitting Physician: TAT, DAVID Juvencus.Bun  Attending Physician: TAT, DAVID [[id number]]  Estimated length of stay: past midnight tomorrow  Certification:: I certify this patient will need inpatient services for at least 2 midnights  PT Class (Do Not Modify): Inpatient [101]  PT Acc Code (Do Not Modify): Private [1]       B Medical/Surgery History Past Medical History:  Diagnosis Date  . Anxiety   . Arthritis   . Carotid artery disease ([place]) 2007   R CEA; dopplers 10/2016:  R ICA moderate (non-obstructive plaque) ; L ICA ~50-69%, but may underestimate. (reassess next with either  CTA versus formal cerebral  angiogram)   . Constipation due to pain medication   . COPD (chronic obstructive pulmonary disease) ([place])   . Depression   . Diverticulosis   . Fibromyalgia   . Gastritis   . GERD (gastroesophageal reflux disease)   . Head injury   . History of pneumonia   . History of stroke 82/4235   complication of R SubClav A PTA -- R hemispheric CVA  . Hypertension   . Hypothyroidism   . Osteoarthritis   . Peripheral vascular disease ([place])    Bilateral subclavian artery disease (status post R SubClav A PTA).  R side CEA (with US findings of Mod-Severe L CIA stenosis).  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   . Urgency incontinence    Past Surgical History:  Procedure Laterality Date  . ANTERIOR AND POSTERIOR REPAIR  12/2000   Archie Endo 07/04/2010  . ANTERIOR CERVICAL DECOMP/DISCECTOMY FUSION N/A 04/03/2012   Procedure: ANTERIOR CERVICAL DECOMPRESSION/DISCECTOMY FUSION 2 LEVELS;  Surgeon: Hosie Spangle, MD;  Location: [facility];  Service: Neurosurgery;  Laterality: N/A;  Cervical four-five,Cervical five-six anterior cervical decompression with fusion plating and bonegraft  . BACK SURGERY    . BREAST SURGERY Right 02/2007   Archie Endo 06/23/2010  . BUNIONECTOMY WITH HAMMERTOE RECONSTRUCTION Left 09/10/2007   Archie Endo 06/23/2010  . CAROTID ENDARTERECTOMY Right 2007   [facility], Dr. Lucky Cowboy  . CATARACT EXTRACTION W/ INTRAOCULAR LENS IMPLANT Left    /  notes 06/23/2010  . CHOLECYSTECTOMY    . COLONOSCOPY N/A 07/15/2014   Procedure: COLONOSCOPY;  Surgeon: Rogene Houston, MD;  [facility];  Service: Endoscopy;  Laterality: N/A;  200 - moved to 2:10 - Ann to notify pt  . COMPRESSION HIP SCREW Right 08/01/2015   Procedure: COMPRESSION HIP;  Surgeon: Carole Civil, MD;  Location: [facility];  Service: Orthopedics;  Laterality: Right;  . ESOPHAGOGASTRODUODENOSCOPY N/A 07/15/2014   Procedure: ESOPHAGOGASTRODUODENOSCOPY (EGD);  Surgeon: Rogene Houston, MD;  [facility];  Service:  Endoscopy;  Laterality: N/A;  . EYE SURGERY     cataract /lens both eyes  . FRACTURE SURGERY    . INCONTINENCE SURGERY  12/2000   Tension-free transvaginal tape procedure/notes 07/04/2010  . IR ANGIOGRAM SELECTIVE EACH ADDITIONAL VESSEL  01/21/2018  . IR ANGIOGRAM VISCERAL SELECTIVE  01/21/2018  . IR PTA NON CORO-LOWER EXTREM  01/21/2018  . IR RADIOLOGIST EVAL & MGMT  12/11/2017  . IR TRANSCATH PLC STENT 1ST ART NOT LE CV CAR VERT CAR  01/21/2018  . IR US GUIDE VASC ACCESS RIGHT  01/21/2018  . JOINT REPLACEMENT    . LUMBAR FUSION  08/2006   Archie Endo 06/23/2010  . PERIPHERAL VASCULAR BALLOON ANGIOPLASTY  02/2013   Aortic arch angiography revealed 70% innominate/R subclavian stenosis along with 60% left common carotid ostial stenosis and moderate left subclavian artery stenosis. ->  Innominate artery PTA with 7 mm balloon reducing stenosis to roughly 30%.  Marland Kitchen PERIPHERAL VASCULAR CATHETERIZATION N/A 10/08/2014   Procedure: Visceral Angiography;  Surgeon: Algernon Huxley, MD;  Location: [place] CV LAB;  Service: Cardiovascular;  - long stenosis/occlusion of SMA. 75% celiac artery (PTA with 6 mm balloon)  . PERIPHERAL VASCULAR CATHETERIZATION N/A 10/08/2014   Procedure: Visceral Artery Intervention;  Surgeon: Algernon Huxley, MD;  Location: [place] [facility];  Service: Cardiovascular;: 6 mm PTA balloon to stenosed CELIAC ARTERY  . ROTATOR CUFF REPAIR Left X 3  . TOTAL KNEE ARTHROPLASTY Bilateral 2005; 2011   right; left  . TOTAL SHOULDER ARTHROPLASTY Left 11/15/2016  . TOTAL SHOULDER ARTHROPLASTY Left 11/15/2016   Procedure: LEFT TOTAL SHOULDER ARTHROPLASTY;  Surgeon: [name], MD;  Location: [facility];  Service: Orthopedics;  Laterality: Left;  . TRANSTHORACIC ECHOCARDIOGRAM  10/2016   Normal LV wall thickness with LVEF 60-65% and grade 1 diastolic dysfunction.  Aortic valve calcific sclerosis with no stenosis.  Mildly calcified mitral annulus and leaflets.   Marland Kitchen VAGINAL HYSTERECTOMY  12/2000   Archie Endo  07/04/2010     A IV Location/Drains/Wounds Patient Lines/Drains/Airways Status   Active Line/Drains/Airways    Name:   Placement date:   Placement time:   Site:   Days:   Peripheral IV 06/26/18 Right Antecubital   06/26/18    1159    Antecubital   less than 1   Peripheral IV 06/26/18 Left Antecubital   06/26/18    1204    Antecubital   less than 1          Intake/Output Last 24 hours No intake or output data in the 24 hours ending 06/26/18 1436  Labs/Imaging Results for orders placed or performed during the hospital encounter of 06/26/18 (from the past 48 hour(s))  Lactic acid, plasma     Status: Abnormal   Collection Time: 06/26/18 12:23 PM  Result Value Ref Range   Lactic Acid, Venous 2.3 (HH) 0.5 - 1.9 mmol/L    Comment: CRITICAL RESULT CALLED TO, READ  BACK BY AND VERIFIED WITH: KEMPT,C AT 1247 ON 5.6.20 BY ISLEY,B Performed at [facility], [address], [place], [place] [address]   Blood Culture (routine x 2)     Status: None (Preliminary result)   Collection Time: 06/26/18 12:23 PM  Result Value Ref Range   Specimen Description LEFT ANTECUBITAL    Special Requests      Blood Culture results may not be optimal due to an excessive volume of blood received in culture bottles Performed at [facility], [address], [place], [place] [address]    Culture PENDING    Report Status PENDING   Blood Culture (routine x 2)     Status: None (Preliminary result)   Collection Time: 06/26/18 12:23 PM  Result Value Ref Range   Specimen Description RIGHT ANTECUBITAL    Special Requests      Blood Culture results may not be optimal due to an excessive volume of blood received in culture bottles Performed at [facility], [address] [place], [place] [address]    Culture PENDING    Report Status PENDING   SARS Coronavirus 2 [facility] order, Performed in [place] hospital lab)     Status: None   Collection Time: 06/26/18 12:24 PM  Result Value Ref Range   SARS Coronavirus 2  NEGATIVE NEGATIVE    Comment: (NOTE) If result is NEGATIVE SARS-CoV-2 target nucleic acids are NOT DETECTED. The SARS-CoV-2 RNA is generally detectable in upper and lower  respiratory specimens during the acute phase of infection. The lowest  concentration of SARS-CoV-2 viral copies this assay can detect is 250  copies / mL. A negative result does not preclude SARS-CoV-2 infection  and should not be used as the sole basis for treatment or other  patient management decisions.  A negative result may occur with  improper specimen collection / handling, submission of specimen other  than nasopharyngeal swab, presence of viral mutation(s) within the  areas targeted by this assay, and inadequate number of viral copies  (<250 copies / mL). A negative result must be combined with clinical  observations, patient history, and epidemiological information. If result is POSITIVE SARS-CoV-2 target nucleic acids are DETECTED. The SARS-CoV-2 RNA is generally detectable in upper and lower  respiratory specimens dur ing the acute phase of infection.  Positive  results are indicative of active infection with SARS-CoV-2.  Clinical  correlation with patient history and other diagnostic information is  necessary to determine patient infection status.  Positive results do  not rule out bacterial infection or co-infection with other viruses. If result is PRESUMPTIVE POSTIVE SARS-CoV-2 nucleic acids MAY BE PRESENT.   A presumptive positive result was obtained on the submitted specimen  and confirmed on repeat testing.  While 2019 novel coronavirus  (SARS-CoV-2) nucleic acids may be present in the submitted sample  additional confirmatory testing may be necessary for epidemiological  and / or clinical management purposes  to differentiate between  SARS-CoV-2 and other Sarbecovirus currently known to infect humans.  If clinically indicated additional testing with an alternate test  methodology [phone]) is  advised. The SARS-CoV-2 RNA is generally  detectable in upper and lower respiratory sp ecimens during the acute  phase of infection. The expected result is Negative. Fact Sheet for Patients:  StrictlyIdeas.no Fact Sheet for Healthcare Providers: BankingDealers.co.za This test is not yet approved or cleared by the [place] FDA and has been authorized for detection and/or diagnosis of SARS-CoV-2 by FDA under an Emergency Use Authorization (  EUA).  This EUA will remain in effect (meaning this test can be used) for the duration of the COVID-19 declaration under Section 564(b)(1) of the Act, 21 U.S.C. section 360bbb-3(b)(1), unless the authorization is terminated or revoked sooner. Performed at [facility], [address], [place], [place] [address]   CBC WITH DIFFERENTIAL     Status: Abnormal   Collection Time: 06/26/18 12:24 PM  Result Value Ref Range   WBC 23.3 (H) 4.0 - 10.5 K/uL   RBC 4.74 3.87 - 5.11 MIL/uL   Hemoglobin 14.7 12.0 - 15.0 g/dL   HCT 43.3 36.0 - 46.0 %   MCV 91.4 80.0 - 100.0 fL   MCH 31.0 26.0 - 34.0 pg   MCHC 33.9 30.0 - 36.0 g/dL   RDW 15.4 11.5 - 15.5 %   Platelets 576 (H) 150 - 400 K/uL   nRBC 0.0 0.0 - 0.2 %   Neutrophils Relative % 92 %   Neutro Abs 21.2 (H) 1.7 - 7.7 K/uL   Lymphocytes Relative 3 %   Lymphs Abs 0.7 0.7 - 4.0 K/uL   Monocytes Relative 4 %   Monocytes Absolute 1.0 0.1 - 1.0 K/uL   Eosinophils Relative 0 %   Eosinophils Absolute 0.0 0.0 - 0.5 K/uL   Basophils Relative 0 %   Basophils Absolute 0.1 0.0 - 0.1 K/uL   Immature Granulocytes 1 %   Abs Immature Granulocytes 0.26 (H) 0.00 - 0.07 K/uL    Comment: Performed at [facility], [address], [place], [place] [address]  Comprehensive metabolic panel     Status: Abnormal   Collection Time: 06/26/18 12:24 PM  Result Value Ref Range   Sodium 133 (L) 135 - 145 mmol/L   Potassium 3.5 3.5 - 5.1 mmol/L   Chloride 89 (L) 98 - 111 mmol/L    CO2 28 22 - 32 mmol/L   Glucose, Bld 131 (H) 70 - 99 mg/dL   BUN 21 8 - 23 mg/dL   Creatinine, Ser 0.75 0.44 - 1.00 mg/dL   Calcium 7.6 (L) 8.9 - 10.3 mg/dL   Total Protein 7.5 6.5 - 8.1 g/dL   Albumin 4.2 3.5 - 5.0 g/dL   AST 27 15 - 41 U/L   ALT 37 0 - 44 U/L   Alkaline Phosphatase 108 38 - 126 U/L   Total Bilirubin 1.4 (H) 0.3 - 1.2 mg/dL   GFR calc non Af Amer >60 >60 mL/min   GFR calc Af Amer >60 >60 mL/min   Anion gap 16 (H) 5 - 15    Comment: Performed at [facility], [address], [place], [place] [address]  D-dimer, quantitative     Status: Abnormal   Collection Time: 06/26/18 12:24 PM  Result Value Ref Range   D-Dimer, Quant 3.12 (H) 0.00 - 0.50 ug/mL-FEU    Comment: (NOTE) At the manufacturer cut-off of 0.50 ug/mL FEU, this assay has been documented to exclude PE with a sensitivity and negative predictive value of 97 to 99%.  At this time, this assay has not been approved by the FDA to exclude DVT/VTE. Results should be correlated with clinical presentation. Performed at [facility], [address] [place], [place] [address]   Procalcitonin     Status: None   Collection Time: 06/26/18 12:24 PM  Result Value Ref Range   Procalcitonin <0.10 ng/mL    Comment:        Interpretation: PCT (Procalcitonin) <= 0.5 ng/mL: Systemic infection (sepsis) is not likely. Local bacterial  infection is possible. (NOTE)       Sepsis PCT Algorithm           Lower Respiratory Tract                                      Infection PCT Algorithm    ----------------------------     ----------------------------         PCT < 0.25 ng/mL                PCT < 0.10 ng/mL         Strongly encourage             Strongly discourage   discontinuation of antibiotics    initiation of antibiotics    ----------------------------     -----------------------------       PCT 0.25 - 0.50 ng/mL            PCT 0.10 - 0.25 ng/mL               OR       >80% decrease in PCT            Discourage initiation  of                                            antibiotics      Encourage discontinuation           of antibiotics    ----------------------------     -----------------------------         PCT >= 0.50 ng/mL              PCT 0.26 - 0.50 ng/mL               AND        <80% decrease in PCT             Encourage initiation of                                             antibiotics       Encourage continuation           of antibiotics    ----------------------------     -----------------------------        PCT >= 0.50 ng/mL                  PCT > 0.50 ng/mL               AND         increase in PCT                  Strongly encourage                                      initiation of antibiotics    Strongly encourage escalation           of antibiotics                                     -----------------------------  PCT <= 0.25 ng/mL                                                 OR                                        > 80% decrease in PCT                                     Discontinue / Do not initiate                                             antibiotics Performed at [facility], [address] [place], [place] [address]   Lactate dehydrogenase     Status: Abnormal   Collection Time: 06/26/18 12:24 PM  Result Value Ref Range   LDH 307 (H) 98 - 192 U/L    Comment: Performed at [facility], [address] [place], [place] [address]  Fibrinogen     Status: None   Collection Time: 06/26/18 12:24 PM  Result Value Ref Range   Fibrinogen 377 210 - 475 mg/dL    Comment: Performed at [facility], [address], [place], [place] [address]  Ferritin     Status: None   Collection Time: 06/26/18 12:25 PM  Result Value Ref Range   Ferritin 67 11 - 307 ng/mL    Comment: Performed at [facility], [address] [place], [place] [address]  Triglycerides     Status: None   Collection Time: 06/26/18 12:25 PM  Result Value Ref Range    Triglycerides 56 <150 mg/dL    Comment: Performed at [facility], [address] [place], [place] [address]  C-reactive protein     Status: None   Collection Time: 06/26/18 12:25 PM  Result Value Ref Range   CRP <0.8 <1.0 mg/dL    Comment: Performed at [facility], [address], [place], [place] [address]   Dg Pelvis 1-2 Views  Result Date: 06/26/2018 CLINICAL DATA:  Right groin pain secondary to a fall 5 days ago. Initial encounter. EXAM: PELVIS - 1-2 VIEW COMPARISON:  CT abdomen and pelvis 01/01/2018. FINDINGS: The patient has a nondisplaced fracture of the right inferior pubic ramus. No other acute fracture is identified. Remote healed right hip fracture with fixation hardware in place noted. Mild bilateral hip degenerative change is present. IMPRESSION: Acute nondisplaced right inferior pubic ramus fracture. Remote healed right hip fracture with fixation hardware in place. Electronically Signed   By: Inge Rise M.D.   On: 06/26/2018 13:21   Dg Chest Portable 1 View  Result Date: 06/26/2018 CLINICAL DATA:  [place] yesterday.  Low oxygen saturation. EXAM: PORTABLE CHEST 1 VIEW COMPARISON:  11/18/2016 FINDINGS: Heart size is normal. Chronic atherosclerosis of the aorta. Pulmonary infiltrates most pronounced in the right upper lobe and left lower lobe. Some possibility there could be mild pulmonary edema, but pneumonia is favored. No visible effusion. Previous left shoulder replacement. Old rib fracture on the left. IMPRESSION: Bilateral infiltrates, most pronounced in the right  upper lobe and left lower lobe, most consistent with pneumonia. Electronically Signed   By: Nelson Chimes M.D.   On: 06/26/2018 10:59    Pending Labs Unresulted Labs (From admission, onward)    Start     Ordered   06/26/18 1132  Lactic acid, plasma  Now then every 2 hours,   STAT     06/26/18 1131   Signed and Held  Basic metabolic panel  Tomorrow morning,   R     Signed and Held   Signed and Held  CBC  Tomorrow  morning,   R     Signed and Held   Signed and Held  Legionella Pneumophila Serogp 1 Ur Ag  Once,   R     Signed and Held   Signed and Held  Strep pneumoniae urinary antigen  Once,   R     Signed and Held   Signed and Held  Iron and TIBC  Tomorrow morning,   R     Signed and Held   Signed and Held  Ferritin  Tomorrow morning,   R     Signed and Held          Vitals/Pain Today's Vitals   06/26/18 1100 06/26/18 1130 06/26/18 1343 06/26/18 1344  BP: (!) 195/74 (!) 187/67 (!) 135/92   Pulse: 90 88  91  Resp:   19 18  Temp:      SpO2: 97% 95%  93%  Weight:      Height:      PainSc:        Isolation Precautions No active isolations  Medications Medications  cefTRIAXone (ROCEPHIN) 1 g in sodium chloride 0.9 % 100 mL IVPB (1 g Intravenous New Bag/Given 06/26/18 1417)  sodium chloride 0.9 % bolus 1,000 mL (1,000 mLs Intravenous New Bag/Given 06/26/18 1345)    Mobility walks with device Moderate fall risk   Focused Assessments    R Recommendations: See Admitting Provider Note  Report given to:   Additional Notes:

## 2018-06-26 NOTE — Progress Notes (Signed)
Notified K schorr via [employer] page system regarding pt uncontrolled pain, BP and nausea. Will await orders

## 2018-06-26 NOTE — ED Provider Notes (Signed)
[facility] Provider Note   CSN: [id number] Arrival date & time: 06/26/18  1018    History   Chief Complaint Chief Complaint  Patient presents with  . Groin Pain    HPI [name] is a 83 y.o. female.     The history is provided by the patient. No language interpreter was used.  Groin Pain  This is a new problem. Episode onset: 6 days. The problem occurs constantly. The problem has been gradually worsening. Associated symptoms include shortness of breath. Nothing aggravates the symptoms. Nothing relieves the symptoms. She has tried nothing for the symptoms. The treatment provided no relief.  Pt reports she fell on Friday.  Pt reports she has pain in her right groin. Pt reports she pain has gotten worse.  Pt has been on prednisone and an antibiotic for cough but cough is getting worse.  EMS reports 02 in low 80's  Pt is not on home oxygen  Past Medical History:  Diagnosis Date  . Anxiety   . Arthritis   . Carotid artery disease ([place]) 2007   R CEA; dopplers 10/2016:  R ICA moderate (non-obstructive plaque) ; L ICA ~50-69%, but may underestimate. (reassess next with either  CTA versus formal cerebral angiogram)   . Constipation due to pain medication   . COPD (chronic obstructive pulmonary disease) ([place])   . Depression   . Diverticulosis   . Fibromyalgia   . Gastritis   . GERD (gastroesophageal reflux disease)   . Head injury   . History of pneumonia   . History of stroke 32/3557   complication of R SubClav A PTA -- R hemispheric CVA  . Hypertension   . Hypothyroidism   . Osteoarthritis   . Peripheral vascular disease ([place])    Bilateral subclavian artery disease (status post R SubClav A PTA).  R side CEA (with US findings of Mod-Severe L CIA stenosis).  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   . Urgency incontinence     Patient Active Problem List   Diagnosis Date Noted  . Acute colitis 12/02/2016  . Uncontrolled  hypertension 12/02/2016  . Enteritis 12/02/2016  . Hyponatremia 12/02/2016  . Status post total shoulder arthroplasty, left 11/21/2016  . COPD (chronic obstructive pulmonary disease) ([place]) 11/21/2016  . Weakness 11/19/2016  . DJD of left shoulder 11/15/2016  . DOE (dyspnea on exertion) 11/11/2016  . Rotator cuff arthropathy of left shoulder 10/30/2016  . Intertrochanteric fracture of right hip ([place]) 08/01/2015    Class: Acute  . SMA stenosis ([place]) 09/15/2014  . Abdominal pain, right upper quadrant 09/15/2014  . Essential hypertension 09/15/2014  . Depression 09/15/2014  . Hypothyroid 09/15/2014  . GERD without esophagitis 09/15/2014  . Lumbago 11/12/2013  . Stiffness of joint, not elsewhere classified, pelvic region and thigh 11/12/2013  . Muscle weakness (generalized) 11/12/2013  . Lack of coordination 03/21/2013  . Fine motor impairment 03/21/2013  . Left leg weakness 03/17/2013  . Risk for falls 03/17/2013  . Carotid artery disease ([place]) 03/11/2013  . Subclavian artery stenosis ([place]) 03/11/2013  . TIA (transient ischemic attack) 03/11/2013  . Tobacco use disorder 03/11/2013  . CVA (cerebral infarction) 03/05/2013  . Pain in joint, shoulder region 11/01/2010    Past Surgical History:  Procedure Laterality Date  . ANTERIOR AND POSTERIOR REPAIR  12/2000   Archie Endo 07/04/2010  . ANTERIOR CERVICAL DECOMP/DISCECTOMY FUSION N/A 04/03/2012   Procedure: ANTERIOR CERVICAL DECOMPRESSION/DISCECTOMY FUSION 2 LEVELS;  Surgeon: Okey Regal  Sherwood Gambler, MD;  Location: [facility];  Service: Neurosurgery;  Laterality: N/A;  Cervical four-five,Cervical five-six anterior cervical decompression with fusion plating and bonegraft  . BACK SURGERY    . BREAST SURGERY Right 02/2007   Archie Endo 06/23/2010  . BUNIONECTOMY WITH HAMMERTOE RECONSTRUCTION Left 09/10/2007   Archie Endo 06/23/2010  . CAROTID ENDARTERECTOMY Right 2007   [facility], Dr. Lucky Cowboy  . CATARACT EXTRACTION W/ INTRAOCULAR LENS IMPLANT Left    Archie Endo  06/23/2010  . CHOLECYSTECTOMY    . COLONOSCOPY N/A 07/15/2014   Procedure: COLONOSCOPY;  Surgeon: Rogene Houston, MD;  [facility];  Service: Endoscopy;  Laterality: N/A;  200 - moved to 2:10 - Ann to notify pt  . COMPRESSION HIP SCREW Right 08/01/2015   Procedure: COMPRESSION HIP;  Surgeon: Carole Civil, MD;  Location: [facility];  Service: Orthopedics;  Laterality: Right;  . ESOPHAGOGASTRODUODENOSCOPY N/A 07/15/2014   Procedure: ESOPHAGOGASTRODUODENOSCOPY (EGD);  Surgeon: Rogene Houston, MD;  Location[facility];  Service: Endoscopy;  Laterality: N/A;  . EYE SURGERY     cataract /lens both eyes  . FRACTURE SURGERY    . INCONTINENCE SURGERY  12/2000   Tension-free transvaginal tape procedure/notes 07/04/2010  . IR ANGIOGRAM SELECTIVE EACH ADDITIONAL VESSEL  01/21/2018  . IR ANGIOGRAM VISCERAL SELECTIVE  01/21/2018  . IR PTA NON CORO-LOWER EXTREM  01/21/2018  . IR RADIOLOGIST EVAL & MGMT  12/11/2017  . IR TRANSCATH PLC STENT 1ST ART NOT LE CV CAR VERT CAR  01/21/2018  . IR US GUIDE VASC ACCESS RIGHT  01/21/2018  . JOINT REPLACEMENT    . LUMBAR FUSION  08/2006   Archie Endo 06/23/2010  . PERIPHERAL VASCULAR BALLOON ANGIOPLASTY  02/2013   Aortic arch angiography revealed 70% innominate/R subclavian stenosis along with 60% left common carotid ostial stenosis and moderate left subclavian artery stenosis. ->  Innominate artery PTA with 7 mm balloon reducing stenosis to roughly 30%.  Marland Kitchen PERIPHERAL VASCULAR CATHETERIZATION N/A 10/08/2014   Procedure: Visceral Angiography;  Surgeon: Algernon Huxley, MD;  Location: [facility];  Service: Cardiovascular;  - long stenosis/occlusion of SMA. 75% celiac artery (PTA with 6 mm balloon)  . PERIPHERAL VASCULAR CATHETERIZATION N/A 10/08/2014   Procedure: Visceral Artery Intervention;  Surgeon: Algernon Huxley, MD;  Location: [facility];  Service: Cardiovascular;: 6 mm PTA balloon to stenosed CELIAC ARTERY  . ROTATOR CUFF REPAIR Left X 3  . TOTAL  KNEE ARTHROPLASTY Bilateral 2005; 2011   right; left  . TOTAL SHOULDER ARTHROPLASTY Left 11/15/2016  . TOTAL SHOULDER ARTHROPLASTY Left 11/15/2016   Procedure: LEFT TOTAL SHOULDER ARTHROPLASTY;  Surgeon: [name], MD;  Location: [facility];  Service: Orthopedics;  Laterality: Left;  . TRANSTHORACIC ECHOCARDIOGRAM  10/2016   Normal LV wall thickness with LVEF 60-65% and grade 1 diastolic dysfunction.  Aortic valve calcific sclerosis with no stenosis.  Mildly calcified mitral annulus and leaflets.   Marland Kitchen VAGINAL HYSTERECTOMY  12/2000   Archie Endo 07/04/2010     OB History   No obstetric history on file.      Home Medications    Prior to Admission medications   Medication Sig Start Date End Date Taking? Authorizing Provider  Ascorbic Acid (VITAMIN C) 1000 MG tablet Take 1,000 mg by mouth 2 (two) times daily.    Yes [provider]  aspirin EC 81 MG tablet Take 81 mg by mouth daily.   Yes [provider]  Calcium Carb-Cholecalciferol (CALCIUM 600 + D PO)  Take 1-2 tablets by mouth See admin instructions. 2 tabs in the morning, and 1 tab at [facility]   Yes [provider]  diltiazem (CARDIZEM CD) 240 MG 24 hr capsule TAKE 1 CAPSULE(240 MG) BY MOUTH DAILY 06/21/18  Yes Leonie Man, MD  diphenhydrAMINE (BENADRYL) 25 mg capsule Take 25 mg by mouth at bedtime.   Yes [provider]  DULoxetine (CYMBALTA) 30 MG capsule Take 30 mg by mouth daily.    Yes [provider]  Flaxseed, Linseed, (FLAXSEED OIL) 1000 MG CAPS Take 1 capsule by mouth 2 (two) times daily.   Yes [provider]  Multiple Vitamin (MULTIVITAMIN WITH MINERALS) TABS Take 1 tablet by mouth daily.   Yes [provider]  niacin 500 MG CR capsule Take 500 mg by mouth at bedtime.   Yes [provider]  Omega-3 1000 MG CAPS Take 1 capsule by mouth daily.   Yes [provider]  pantoprazole (PROTONIX) 40 MG tablet Take 1 tablet (40 mg total) by mouth daily. 05/27/18   Yes Setzer, Rona Ravens, NP    Family History Family History  Problem Relation Age of Onset  . Pancreatic cancer Mother   . Prostate cancer Father     Social History Social History   Tobacco Use  . Smoking status: Former Smoker    Packs/day: 0.12    Years: 63.00    Pack years: 7.56    Types: Cigarettes  . Smokeless tobacco: Never Used  Substance Use Topics  . Alcohol use: No    Alcohol/week: 0.0 standard drinks  . Drug use: No     Allergies   Aleve [naproxen sodium]; Hydrocodone; Statins; Gluten meal; Wheat bran; Celebrex [celecoxib]; Codeine; and Morphine and related   Review of Systems Review of Systems  Respiratory: Positive for shortness of breath.   All other systems reviewed and are negative.    Physical Exam Updated Vital Signs BP (!) 187/67   Pulse 88   Temp 98.6 F (37 C)   Resp 20   Ht 5\' 1"  (1.549 m)   Wt 47.6 kg   SpO2 95%   BMI 19.84 kg/m   Physical Exam Vitals signs and nursing note reviewed.  Constitutional:      General: She is not in acute distress.    Appearance: She is well-developed.  HENT:     Head: Normocephalic and atraumatic.     Right Ear: External ear normal.     Left Ear: External ear normal.     Nose: Nose normal.     Mouth/Throat:     Mouth: Mucous membranes are moist.  Eyes:     Conjunctiva/sclera: Conjunctivae normal.  Neck:     Musculoskeletal: Neck supple.  Cardiovascular:     Rate and Rhythm: Normal rate and regular rhythm.     Heart sounds: No murmur.  Pulmonary:     Effort: Pulmonary effort is normal. No respiratory distress.     Breath sounds: Normal breath sounds.  Abdominal:     General: Abdomen is flat. Bowel sounds are normal.     Palpations: Abdomen is soft.     Tenderness: There is no abdominal tenderness.  Skin:    General: Skin is warm and dry.  Neurological:     General: No focal deficit present.     Mental Status: She is alert.  Psychiatric:        Mood and Affect: Mood normal.      ED  Treatments / Results  Labs (all labs ordered are listed, but only abnormal results are displayed) Labs Reviewed  LACTIC ACID, PLASMA - Abnormal; Notable for the following components:      Result Value   Lactic Acid, Venous 2.3 (*)    All other components within normal limits  CBC WITH DIFFERENTIAL/PLATELET - Abnormal; Notable for the following components:   WBC 23.3 (*)    Platelets 576 (*)    Neutro Abs 21.2 (*)    Abs Immature Granulocytes 0.26 (*)    All other components within normal limits  COMPREHENSIVE METABOLIC PANEL - Abnormal; Notable for the following components:   Sodium 133 (*)    Chloride 89 (*)    Glucose, Bld 131 (*)    Calcium 7.6 (*)    Total Bilirubin 1.4 (*)    Anion gap 16 (*)    All other components within normal limits  D-DIMER, QUANTITATIVE (NOT AT [facility]) - Abnormal; Notable for the following components:   D-Dimer, Quant 3.12 (*)    All other components within normal limits  LACTATE DEHYDROGENASE - Abnormal; Notable for the following components:   LDH 307 (*)    All other components within normal limits  SARS CORONAVIRUS 2 (HOSPITAL ORDER, [facility])  CULTURE, BLOOD (ROUTINE X 2)  CULTURE, BLOOD (ROUTINE X 2)  FERRITIN  TRIGLYCERIDES  FIBRINOGEN  C-REACTIVE PROTEIN  LACTIC ACID, PLASMA  PROCALCITONIN    EKG EKG Interpretation  Date/Time:  Wednesday Jun 26 2018 11:45:47 EDT Ventricular Rate:  85 PR Interval:    QRS Duration: 101 QT Interval:  437 QTC Calculation: 520 R Axis:   81 Text Interpretation:  Sinus rhythm Right atrial enlargement Borderline right axis deviation Prolonged QT interval Baseline wander When compared with ECG of 11/18/2016 QT has lengthened Confirmed by Francine Graven [phone]) on 06/26/2018 11:56:58 AM   Radiology Dg Pelvis 1-2 Views  Result Date: 06/26/2018 CLINICAL DATA:  Right groin pain secondary to a fall 5 days ago. Initial encounter. EXAM: PELVIS - 1-2 VIEW COMPARISON:  CT abdomen and  pelvis 01/01/2018. FINDINGS: The patient has a nondisplaced fracture of the right inferior pubic ramus. No other acute fracture is identified. Remote healed right hip fracture with fixation hardware in place noted. Mild bilateral hip degenerative change is present. IMPRESSION: Acute nondisplaced right inferior pubic ramus fracture. Remote healed right hip fracture with fixation hardware in place. Electronically Signed   By: Inge Rise M.D.   On: 06/26/2018 13:21   Dg Chest Portable 1 View  Result Date: 06/26/2018 CLINICAL DATA:  [place] yesterday.  Low oxygen saturation. EXAM: PORTABLE CHEST 1 VIEW COMPARISON:  11/18/2016 FINDINGS: Heart size is normal. Chronic atherosclerosis of the aorta. Pulmonary infiltrates most pronounced in the right upper lobe and left lower lobe. Some possibility there could be mild pulmonary edema, but pneumonia is favored. No visible effusion. Previous left shoulder replacement. Old rib fracture on the left. IMPRESSION: Bilateral infiltrates, most pronounced in the right upper lobe and left lower lobe, most consistent with pneumonia. Electronically Signed   By: Nelson Chimes M.D.   On: 06/26/2018 10:59    Procedures Procedures (including critical care time)  Medications Ordered in ED Medications  sodium chloride 0.9 % bolus 1,000 mL (1,000 mLs Intravenous New Bag/Given 06/26/18 1345)     Initial Impression / Assessment and Plan / ED Course  I have reviewed the triage vital signs and the nursing notes.  Pertinent labs & imaging results that were available during my care  of the patient were reviewed by me and considered in my medical decision making (see chart for details).        MDM  Pt has pneumonia on chest xray and nondisplaced right pubic ramus fracture.  O2 improved to 95% on 2 liters of 02.  Covid is negative Hospitalist consulted for admission   Final Clinical Impressions(s) / ED Diagnoses   Final diagnoses:  Community acquired pneumonia, unspecified  laterality  Closed fracture of ramus of right pubis, initial encounter [facility])    ED Discharge Orders    None       Fransico Meadow, [place] 06/26/18 [place], [place], DO 06/29/18 1910

## 2018-06-26 NOTE — ED Triage Notes (Signed)
Pt reports fell in her yard Friday and c/o pain in r groin.  Reports pain has gotten worse each day.  EMS also found pt's 02 sat 82%.  Reports is not on o2 at home but has COPD.  CBG 179.  PT has cough at this time, says is being treated for allergies.  Reports has been on prednisone and antibiotic for the past week.

## 2018-06-26 NOTE — H&P (Signed)
History and Physical  [name] UTM:[id number] DOB: 06-13-1935 DOA: 06/26/2018   PCP: Sinda Du, MD   Patient coming from: Home  Chief Complaint: Right groin pain  HPI:  [name] is a 83 y.o. female with medical history of COPD, hypertension, celiac stenosis, depression, GERD, hypothyroidism, stroke presenting with right groin pain that began on 06/21/2018.  Patient states that she was bringing her groceries and when she bent over to pull a weed and slipped backwards.  She felt like she was able to catch her self before actually having an actual fall.  However after she woke up from a nap on the afternoon of 06/21/2018, the patient complained of right groin pain resulting in difficulty lifting her right leg.  Over the weekend, the patient complained of increasing generalized weakness without any fevers, chills, nausea, vomiting, diarrhea.  The patient states that she has had coughing and shortness of breath for the better part of a week.  She stated that she called her PCP, Dr. Luan Pulling.  She was placed on azithromycin and prednisone which she states she began taking on 06/17/2018.  She finished azithromycin on 06/21/2018.  Today was supposed to be her last day of prednisone.  She stated that her shortness of breath is somewhat improved, although she continued to have a nonproductive cough without hemoptysis.  She denied any close contacts or recent travels.  Because of increasing pain in her right groin, EMS was activated.  The patient was noted to have oxygen saturation of 82% on room air.  She was placed on supplemental oxygen at 2 L with improvement of her saturation 95%. In the emergency department, the patient was afebrile and hemodynamically stable saturating 100% on 2-1/2 L.  WBC was 23.3 with platelets 576,000.  BMP was unremarkable except for sodium 133.  LFTs were unremarkable.  Coronavirus NAA was negative.  Chest x-ray showed bilateral infiltrates, recurrent in the left.   There was also an acute nondisplaced right inferior pubic rami fracture on pelvic x-ray.  Assessment/Plan: Sepsis -Patient presented with leukocytosis and elevated lactic acid -Secondary to pneumonia -Certainly, the patient's leukocytosis may be in part due to her recent steroids -Lactic acid peaked 2.3  Acute respiratory failure with hypoxia -Presently stable on 2.5 L -Secondary to pneumonia -Wean oxygen as tolerated for saturation greater than 92%  Community-acquired pneumonia -Patient appears to have failed azithromycin -Start levofloxacin -Check procalcitonin -Urine Legionella antigen -Urine Streptococcus pneumoniae antigen  Right inferior pubic ramus fracture -Likely nonoperative management -PT evaluation -Orthopedic consult  Tobacco abuse -Tobacco cessation discussed  Celiac stenosis -Continue aspirin -The patient had angioplasty of her celiac artery 01/21/18 -She finished 90 days of Plavix  Essential hypertension -Continue diltiazem  Depression -Continue Cymbalta        Past Medical History:  Diagnosis Date  . Anxiety   . Arthritis   . Carotid artery disease ([place]) 2007   R CEA; dopplers 10/2016:  R ICA moderate (non-obstructive plaque) ; L ICA ~50-69%, but may underestimate. (reassess next with either  CTA versus formal cerebral angiogram)   . Constipation due to pain medication   . COPD (chronic obstructive pulmonary disease) ([place])   . Depression   . Diverticulosis   . Fibromyalgia   . Gastritis   . GERD (gastroesophageal reflux disease)   . Head injury   . History of pneumonia   . History of stroke 56/8127   complication of R SubClav A PTA -- R hemispheric CVA  .  Hypertension   . Hypothyroidism   . Osteoarthritis   . Peripheral vascular disease ([place])    Bilateral subclavian artery disease (status post R SubClav A PTA).  R side CEA (with US findings of Mod-Severe L CIA stenosis).  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   .  Urgency incontinence    Past Surgical History:  Procedure Laterality Date  . ANTERIOR AND POSTERIOR REPAIR  12/2000   Archie Endo 07/04/2010  . ANTERIOR CERVICAL DECOMP/DISCECTOMY FUSION N/A 04/03/2012   Procedure: ANTERIOR CERVICAL DECOMPRESSION/DISCECTOMY FUSION 2 LEVELS;  Surgeon: Hosie Spangle, MD;  Location: [facility]URO ORS;  Service: Neurosurgery;  Laterality: N/A;  Cervical four-five,Cervical five-six anterior cervical decompression with fusion plating and bonegraft  . BACK SURGERY    . BREAST SURGERY Right 02/2007   Archie Endo 06/23/2010  . BUNIONECTOMY WITH HAMMERTOE RECONSTRUCTION Left 09/10/2007   Archie Endo 06/23/2010  . CAROTID ENDARTERECTOMY Right 2007   [facility], Dr. Lucky Cowboy  . CATARACT EXTRACTION W/ INTRAOCULAR LENS IMPLANT Left    Archie Endo 06/23/2010  . CHOLECYSTECTOMY    . COLONOSCOPY N/A 07/15/2014   Procedure: COLONOSCOPY;  Surgeon: Rogene Houston, MD;  [facility];  Service: Endoscopy;  Laterality: N/A;  200 - moved to 2:10 - Ann to notify pt  . COMPRESSION HIP SCREW Right 08/01/2015   Procedure: COMPRESSION HIP;  Surgeon: Carole Civil, MD;  Location: [facility] OR[facility];  Service: Orthopedics;  Laterality: Right;  . ESOPHAGOGASTRODUODENOSCOPY N/A 07/15/2014   Procedure: ESOPHAGOGASTRODUODENOSCOPY (EGD);  Surgeon: Rogene Houston, MD;  [facility];  Service: Endoscopy;  Laterality: N/A;  . EYE SURGERY     cataract /lens both eyes  . FRACTURE SURGERY    . INCONTINENCE SURGERY  12/2000   Tension-free transvaginal tape procedure/notes 07/04/2010  . IR ANGIOGRAM SELECTIVE EACH ADDITIONAL VESSEL  01/21/2018  . IR ANGIOGRAM VISCERAL SELECTIVE  01/21/2018  . IR PTA NON CORO-LOWER EXTREM  01/21/2018  . IR RADIOLOGIST EVAL & MGMT  12/11/2017  . IR TRANSCATH PLC STENT 1ST ART NOT LE CV CAR VERT CAR  01/21/2018  . IR US GUIDE VASC ACCESS RIGHT  01/21/2018  . JOINT REPLACEMENT    . LUMBAR FUSION  08/2006   Archie Endo 06/23/2010  . PERIPHERAL VASCULAR BALLOON ANGIOPLASTY  02/2013   Aortic arch  angiography revealed 70% innominate/R subclavian stenosis along with 60% left common carotid ostial stenosis and moderate left subclavian artery stenosis. ->  Innominate artery PTA with 7 mm balloon reducing stenosis to roughly 30%.  Marland Kitchen PERIPHERAL VASCULAR CATHETERIZATION N/A 10/08/2014   Procedure: Visceral Angiography;  Surgeon: Algernon Huxley, MD;  Location: [place] CV LAB;  Service: Cardiovascular;  - long stenosis/occlusion of SMA. 75% celiac artery (PTA with 6 mm balloon)  . PERIPHERAL VASCULAR CATHETERIZATION N/A 10/08/2014   Procedure: Visceral Artery Intervention;  Surgeon: Algernon Huxley, MD;  Location: [place] CV LAB;  Service: Cardiovascular;: 6 mm PTA balloon to stenosed CELIAC ARTERY  . ROTATOR CUFF REPAIR Left X 3  . TOTAL KNEE ARTHROPLASTY Bilateral 2005; 2011   right; left  . TOTAL SHOULDER ARTHROPLASTY Left 11/15/2016  . TOTAL SHOULDER ARTHROPLASTY Left 11/15/2016   Procedure: LEFT TOTAL SHOULDER ARTHROPLASTY;  Surgeon: [name], MD;  Location: [place];  Service: Orthopedics;  Laterality: Left;  . TRANSTHORACIC ECHOCARDIOGRAM  10/2016   Normal LV wall thickness with LVEF 60-65% and grade 1 diastolic dysfunction.  Aortic valve calcific sclerosis with no stenosis.  Mildly calcified mitral annulus and leaflets.   Marland Kitchen  VAGINAL HYSTERECTOMY  12/2000   Archie Endo 07/04/2010   Social History:  reports that she has quit smoking. Her smoking use included cigarettes. She has a 7.56 pack-year smoking history. She has never used smokeless tobacco. She reports that she does not drink alcohol or use drugs.   Family History  Problem Relation Age of Onset  . Pancreatic cancer Mother   . Prostate cancer Father      Allergies  Allergen Reactions  . Aleve [Naproxen Sodium] Hives and Palpitations  . Hydrocodone Nausea Only  . Statins Other (See Comments)    Muscle pain  . Gluten Meal   . Wheat Bran     Gluten intolerant  . Celebrex [Celecoxib] Nausea And Vomiting  . Codeine Nausea  And Vomiting  . Morphine And Related Nausea And Vomiting     Prior to Admission medications   Medication Sig Start Date End Date Taking? Authorizing Provider  Ascorbic Acid (VITAMIN C) 1000 MG tablet Take 1,000 mg by mouth 2 (two) times daily.    Yes [provider]  aspirin EC 81 MG tablet Take 81 mg by mouth daily.   Yes [provider]  Calcium Carb-Cholecalciferol (CALCIUM 600 + D PO) Take 1-2 tablets by mouth See admin instructions. 2 tabs in the morning, and [facility]   Yes [provider]  diltiazem (CARDIZEM CD) 240 MG 24 hr capsule TAKE 1 CAPSULE(240 MG) BY MOUTH DAILY 06/21/18  Yes Leonie Man, MD  diphenhydrAMINE (BENADRYL) 25 mg capsule Take 25 mg by mouth at bedtime.   Yes [provider]  DULoxetine (CYMBALTA) 30 MG capsule Take 30 mg by mouth daily.    Yes [provider]  Flaxseed, Linseed, (FLAXSEED OIL) 1000 MG CAPS Take 1 capsule by mouth 2 (two) times daily.   Yes [provider]  Multiple Vitamin (MULTIVITAMIN WITH MINERALS) TABS Take 1 tablet by mouth daily.   Yes [provider]  niacin 500 MG CR capsule Take 500 mg by mouth at bedtime.   Yes [provider]  Omega-3 1000 MG CAPS Take 1 capsule by mouth daily.   Yes [provider]  pantoprazole (PROTONIX) 40 MG tablet Take 1 tablet (40 mg total) by mouth daily. 05/27/18  Yes Setzer, Rona Ravens, NP    Review of Systems:  Constitutional:  No weight loss, night sweats, Fevers, chills, fatigue.  Head&Eyes: No headache.  No vision loss.  No eye pain or scotoma ENT:  No Difficulty swallowing,Tooth/dental problems,Sore throat,  No ear ache, post nasal drip,  Cardio-vascular:  No chest pain, Orthopnea, PND, swelling in lower extremities,  dizziness, palpitations  GI:  No  abdominal pain, nausea, vomiting, diarrhea, loss of appetite, hematochezia, melena, heartburn, indigestion, Resp:  No coughing up of blood .No wheezing.No chest wall  deformity  Skin:  no rash or lesions.  GU:  no dysuria, change in color of urine, no urgency or frequency. No flank pain.  Musculoskeletal:  No back pain.  Psych:  No change in mood or affect. No depression or anxiety. Neurologic: No headache, no dysesthesia, no focal weakness, no vision loss. No syncope  Physical Exam: Vitals:   06/26/18 1100 06/26/18 1130 06/26/18 1343 06/26/18 1344  BP: (!) 195/74 (!) 187/67 (!) 135/92   Pulse: 90 88  91  Resp:   19 18  Temp:      SpO2: 97% 95%  93%  Weight:      Height:  General:  A&O x 3, NAD, nontoxic, pleasant/cooperative Head/Eye: No conjunctival hemorrhage, no icterus, [place]/AT, No nystagmus ENT:  No icterus,  No thrush, good dentition, no pharyngeal exudate Neck:  No masses, no lymphadenpathy, no bruits CV:  RRR, no rub, no gallop, no S3 Lung:  Bibasilar rales R>L with diminished breath sounds Abdomen: soft/NT, +BS, nondistended, no peritoneal signs Ext: No cyanosis, No rashes, No petechiae, No lymphangitis, No edema Neuro: CNII-XII intact, strength 4/5 in bilateral upper and lower extremities, no dysmetria  Labs on Admission:  Basic Metabolic Panel: Recent Labs  Lab 06/26/18 1224  NA 133*  K 3.5  CL 89*  CO2 28  GLUCOSE 131*  BUN 21  CREATININE 0.75  CALCIUM 7.6*   Liver Function Tests: Recent Labs  Lab 06/26/18 1224  AST 27  ALT 37  ALKPHOS 108  BILITOT 1.4*  PROT 7.5  ALBUMIN 4.2   No results for input(s): LIPASE, AMYLASE in the last 168 hours. No results for input(s): AMMONIA in the last 168 hours. CBC: Recent Labs  Lab 06/26/18 1224  WBC 23.3*  NEUTROABS 21.2*  HGB 14.7  HCT 43.3  MCV 91.4  PLT 576*   Coagulation Profile: No results for input(s): INR, PROTIME in the last 168 hours. Cardiac Enzymes: No results for input(s): CKTOTAL, CKMB, CKMBINDEX, TROPONINI in the last 168 hours. BNP: Invalid input(s): POCBNP CBG: No results for input(s): GLUCAP in the last 168 hours. Urine analysis:     Component Value Date/Time   COLORURINE YELLOW 12/08/2016 1238   APPEARANCEUR HAZY (A) 12/08/2016 1238   LABSPEC 1.012 12/08/2016 1238   PHURINE 6.0 12/08/2016 1238   GLUCOSEU NEGATIVE 12/08/2016 1238   HGBUR NEGATIVE 12/08/2016 [place] 12/08/2016 1238   KETONESUR NEGATIVE 12/08/2016 1238   PROTEINUR 100 (A) 12/08/2016 1238   UROBILINOGEN 0.2 10/22/2014 0019   NITRITE NEGATIVE 12/08/2016 1238   LEUKOCYTESUR NEGATIVE 12/08/2016 1238   Sepsis Labs: @LABRCNTIP (procalcitonin:4,lacticidven:4) ) Recent Results (from the past 240 hour(s))  Blood Culture (routine x 2)     Status: None (Preliminary result)   Collection Time: 06/26/18 12:23 PM  Result Value Ref Range Status   Specimen Description LEFT ANTECUBITAL  Final   Special Requests   Final    Blood Culture results may not be optimal due to an excessive volume of blood received in culture bottles Performed at [facility], [address] [place], [place] [address]    Culture PENDING  Incomplete   Report Status PENDING  Incomplete  Blood Culture (routine x 2)     Status: None (Preliminary result)   Collection Time: 06/26/18 12:23 PM  Result Value Ref Range Status   Specimen Description RIGHT ANTECUBITAL  Final   Special Requests   Final    Blood Culture results may not be optimal due to an excessive volume of blood received in culture bottles Performed at [facility], [address], [place], [place] [address]    Culture PENDING  Incomplete   Report Status PENDING  Incomplete  SARS Coronavirus 2 [facility] order, Performed in [place] hospital lab)     Status: None   Collection Time: 06/26/18 12:24 PM  Result Value Ref Range Status   SARS Coronavirus 2 NEGATIVE NEGATIVE Final    Comment: (NOTE) If result is NEGATIVE SARS-CoV-2 target nucleic acids are NOT DETECTED. The SARS-CoV-2 RNA is generally detectable in upper and lower  respiratory specimens during the acute phase of infection. The lowest   concentration of SARS-CoV-2 viral  copies this assay can detect is 250  copies / mL. A negative result does not preclude SARS-CoV-2 infection  and should not be used as the sole basis for treatment or other  patient management decisions.  A negative result may occur with  improper specimen collection / handling, submission of specimen other  than nasopharyngeal swab, presence of viral mutation(s) within the  areas targeted by this assay, and inadequate number of viral copies  (<250 copies / mL). A negative result must be combined with clinical  observations, patient history, and epidemiological information. If result is POSITIVE SARS-CoV-2 target nucleic acids are DETECTED. The SARS-CoV-2 RNA is generally detectable in upper and lower  respiratory specimens dur ing the acute phase of infection.  Positive  results are indicative of active infection with SARS-CoV-2.  Clinical  correlation with patient history and other diagnostic information is  necessary to determine patient infection status.  Positive results do  not rule out bacterial infection or co-infection with other viruses. If result is PRESUMPTIVE POSTIVE SARS-CoV-2 nucleic acids MAY BE PRESENT.   A presumptive positive result was obtained on the submitted specimen  and confirmed on repeat testing.  While 2019 novel coronavirus  (SARS-CoV-2) nucleic acids may be present in the submitted sample  additional confirmatory testing may be necessary for epidemiological  and / or clinical management purposes  to differentiate between  SARS-CoV-2 and other Sarbecovirus currently known to infect humans.  If clinically indicated additional testing with an alternate test  methodology [phone]) is advised. The SARS-CoV-2 RNA is generally  detectable in upper and lower respiratory sp ecimens during the acute  phase of infection. The expected result is Negative. Fact Sheet for Patients:  StrictlyIdeas.no Fact Sheet  for Healthcare Providers: BankingDealers.co.za This test is not yet approved or cleared by the [place] FDA and has been authorized for detection and/or diagnosis of SARS-CoV-2 by FDA under an Emergency Use Authorization (EUA).  This EUA will remain in effect (meaning this test can be used) for the duration of the COVID-19 declaration under Section 564(b)(1) of the Act, 21 U.S.C. section 360bbb-3(b)(1), unless the authorization is terminated or revoked sooner. Performed at [facility], [address] [place], [place] [address]      Radiological Exams on Admission: Dg Pelvis 1-2 Views  Result Date: 06/26/2018 CLINICAL DATA:  Right groin pain secondary to a fall 5 days ago. Initial encounter. EXAM: PELVIS - 1-2 VIEW COMPARISON:  CT abdomen and pelvis 01/01/2018. FINDINGS: The patient has a nondisplaced fracture of the right inferior pubic ramus. No other acute fracture is identified. Remote healed right hip fracture with fixation hardware in place noted. Mild bilateral hip degenerative change is present. IMPRESSION: Acute nondisplaced right inferior pubic ramus fracture. Remote healed right hip fracture with fixation hardware in place. Electronically Signed   By: Inge Rise M.D.   On: 06/26/2018 13:21   Dg Chest Portable 1 View  Result Date: 06/26/2018 CLINICAL DATA:  [place] yesterday.  Low oxygen saturation. EXAM: PORTABLE CHEST 1 VIEW COMPARISON:  11/18/2016 FINDINGS: Heart size is normal. Chronic atherosclerosis of the aorta. Pulmonary infiltrates most pronounced in the right upper lobe and left lower lobe. Some possibility there could be mild pulmonary edema, but pneumonia is favored. No visible effusion. Previous left shoulder replacement. Old rib fracture on the left. IMPRESSION: Bilateral infiltrates, most pronounced in the right upper lobe and left lower lobe, most consistent with pneumonia. Electronically Signed   By: Nelson Chimes M.D.   On: 06/26/2018  10:59     EKG: Independently reviewed. Sinus, nonspecific T wave change    Time spent:60 minutes Code Status:   FULL Family Communication:  No Family at bedside Disposition Plan: expect 2-3 day hospitalization Consults called: ortho--[place] DVT Prophylaxis: [place] Lovenox  Orson Eva, DO  [facility] Pager [phone]  If 7PM-7AM, please contact night-coverage [email or web address] Password [email or web address] 06/26/2018, 2:51 PM

## 2018-06-27 LAB — CBC
HCT: 34.8 % — ABNORMAL LOW (ref 36.0–46.0)
Hemoglobin: 11.4 g/dL — ABNORMAL LOW (ref 12.0–15.0)
MCH: 30.2 pg (ref 26.0–34.0)
MCHC: 32.8 g/dL (ref 30.0–36.0)
MCV: 92.3 fL (ref 80.0–100.0)
Platelets: 476 10*3/uL — ABNORMAL HIGH (ref 150–400)
RBC: 3.77 MIL/uL — ABNORMAL LOW (ref 3.87–5.11)
RDW: 15.1 % (ref 11.5–15.5)
WBC: 18.5 10*3/uL — ABNORMAL HIGH (ref 4.0–10.5)
nRBC: 0 % (ref 0.0–0.2)

## 2018-06-27 LAB — STREP PNEUMONIAE URINARY ANTIGEN: Strep Pneumo Urinary Antigen: NEGATIVE

## 2018-06-27 LAB — IRON AND TIBC
Iron: 83 ug/dL (ref 28–170)
Saturation Ratios: 24 % (ref 10.4–31.8)
TIBC: 352 ug/dL (ref 250–450)
UIBC: 269 ug/dL

## 2018-06-27 LAB — BASIC METABOLIC PANEL
Anion gap: 9 (ref 5–15)
BUN: 22 mg/dL (ref 8–23)
CO2: 26 mmol/L (ref 22–32)
Calcium: 6.6 mg/dL — ABNORMAL LOW (ref 8.9–10.3)
Chloride: 97 mmol/L — ABNORMAL LOW (ref 98–111)
Creatinine, Ser: 0.57 mg/dL (ref 0.44–1.00)
GFR calc Af Amer: >60 mL/min (ref >60)
GFR calc non Af Amer: >60 mL/min (ref >60)
Glucose, Bld: 115 mg/dL — ABNORMAL HIGH (ref 70–99)
Potassium: 3.5 mmol/L (ref 3.5–5.1)
Sodium: 132 mmol/L — ABNORMAL LOW (ref 135–145)

## 2018-06-27 LAB — FERRITIN: Ferritin: 61 ng/mL (ref 11–307)

## 2018-06-27 MED ORDER — GLUCERNA SHAKE PO LIQD
237.0000 mL | Freq: Two times a day (BID) | ORAL | Status: DC
  Administered 2018-06-27: 16:00:00 237 mL via ORAL

## 2018-06-27 MED ORDER — GUAIFENESIN ER 600 MG PO TB12
1200.0000 mg | ORAL_TABLET | Freq: Two times a day (BID) | ORAL | Status: DC
  Administered 2018-06-27 – 2018-07-02 (×11): 1200 mg via ORAL
  Filled 2018-06-27 (×12): qty 2

## 2018-06-27 MED ORDER — LOPERAMIDE HCL 2 MG PO CAPS: 2.0000 mg | ORAL_CAPSULE | ORAL | Status: DC | PRN

## 2018-06-27 MED ORDER — SODIUM CHLORIDE 0.9 % IV SOLN
INTRAVENOUS | Status: DC | PRN
  Administered 2018-06-27: 18:00:00 250 mL via INTRAVENOUS

## 2018-06-27 MED ORDER — TRAMADOL HCL 50 MG PO TABS
50.0000 mg | ORAL_TABLET | Freq: Four times a day (QID) | ORAL | Status: DC | PRN
  Administered 2018-06-27 – 2018-07-01 (×5): 50 mg via ORAL
  Filled 2018-06-27 (×5): qty 1

## 2018-06-27 NOTE — NC FL2 (Signed)
[place] LEVEL OF CARE SCREENING TOOL     IDENTIFICATION  Patient Name: [name] Birthdate: [birth year] Sex: female Admission Date (Current Location): 06/26/2018  [facility] and [place] Number:  [employer] and Address:  [place] [address] [place]      Provider Number: [id number]  Attending Physician Name and Address:  Sinda Du, MD  Relative Name and Phone Number:  Oralia, Criger   [phone]     Current Level of Care: Hospital Recommended Level of Care: [place] Prior Approval Number: [id number]   Date Approved/Denied:   PASRR Number:    Discharge Plan: SNF    Current Diagnoses: Patient Active Problem List   Diagnosis Date Noted  . Sepsis due to undetermined organism ([place]) 06/26/2018  . Acute respiratory failure with hypoxia ([place]) 06/26/2018  . Lobar pneumonia ([place]) 06/26/2018  . Closed fracture of multiple pubic rami, right, with delayed healing, subsequent encounter 06/26/2018  . Closed fracture of ramus of right pubis ([place])   . Acute colitis 12/02/2016  . Uncontrolled hypertension 12/02/2016  . Enteritis 12/02/2016  . Hyponatremia 12/02/2016  . Status post total shoulder arthroplasty, left 11/21/2016  . COPD (chronic obstructive pulmonary disease) ([place]) 11/21/2016  . Weakness 11/19/2016  . DJD of left shoulder 11/15/2016  . DOE (dyspnea on exertion) 11/11/2016  . Rotator cuff arthropathy of left shoulder 10/30/2016  . Intertrochanteric fracture of right hip ([place]) 08/01/2015    Class: Acute  . SMA stenosis ([place]) 09/15/2014  . Abdominal pain, right upper quadrant 09/15/2014  . Essential hypertension 09/15/2014  . Depression 09/15/2014  . Hypothyroid 09/15/2014  . GERD without esophagitis 09/15/2014  . Lumbago 11/12/2013  . Stiffness of joint, not elsewhere classified, pelvic region and thigh 11/12/2013  . Muscle weakness (generalized) 11/12/2013  . Lack of  coordination 03/21/2013  . Fine motor impairment 03/21/2013  . Left leg weakness 03/17/2013  . Risk for falls 03/17/2013  . Carotid artery disease ([place]) 03/11/2013  . Subclavian artery stenosis ([place]) 03/11/2013  . TIA (transient ischemic attack) 03/11/2013  . Tobacco use disorder 03/11/2013  . CVA (cerebral infarction) 03/05/2013  . Pain in joint, shoulder region 11/01/2010    Orientation RESPIRATION BLADDER Height & Weight     Self, Time, Situation  O2(for comfort) Continent Weight: 49.5 kg Height:  5' 1.5" (156.2 cm)  BEHAVIORAL SYMPTOMS/MOOD NEUROLOGICAL BOWEL NUTRITION STATUS      Continent Diet(heart healthy )  AMBULATORY STATUS COMMUNICATION OF NEEDS Skin   Extensive Assist Verbally Bruising(arms, right buttock)                       Personal Care Assistance Level of Assistance  Bathing, Feeding, Dressing Bathing Assistance: Limited assistance Feeding assistance: Independent Dressing Assistance: Limited assistance     Functional Limitations Info  Sight, Hearing, Speech Sight Info: Adequate Hearing Info: Adequate Speech Info: Adequate    SPECIAL CARE FACTORS FREQUENCY  PT (By licensed PT)     PT Frequency: 5x/week              Contractures Contractures Info: Not present    Additional Factors Info  Psychotropic Code Status Info: DNR Allergies Info: Celebrex, Codeine, Glutean meal, Wheat Bran, Aleve, Hydrocodone, Statins, Morphine Psychotropic Info: Cymbalta         Current Medications (06/27/2018):  This is the current hospital active medication list Current Facility-Administered Medications  Medication Dose Route Frequency Provider Last Rate Last Dose  .  0.9 % NaCl with KCl 20 mEq/ L  infusion   Intravenous Continuous Tat, David, MD 75 mL/hr at 06/27/18 1018    . acetaminophen (TYLENOL) tablet 650 mg  650 mg Oral Q6H PRN Tat, David, MD       Or  . acetaminophen (TYLENOL) suppository 650 mg  650 mg Rectal Q6H PRN Tat, Shanon Brow, MD      . aspirin EC  tablet 81 mg  81 mg Oral Daily Tat, David, MD   81 mg at 06/27/18 1012  . diltiazem (CARDIZEM CD) 24 hr capsule 240 mg  240 mg Oral Daily Tat, David, MD   240 mg at 06/27/18 1012  . DULoxetine (CYMBALTA) DR capsule 30 mg  30 mg Oral Daily Tat, David, MD   30 mg at 06/27/18 1012  . enoxaparin (LOVENOX) injection 40 mg  40 mg Subcutaneous Q24H Tat, Shanon Brow, MD   40 mg at 06/26/18 1819  . feeding supplement (ENSURE ENLIVE) (ENSURE ENLIVE) liquid 237 mL  237 mL Oral BID BM Tat, Shanon Brow, MD   237 mL at 06/27/18 1014  . guaiFENesin (MUCINEX) 12 hr tablet 1,200 mg  1,200 mg Oral BID Sinda Du, MD   1,200 mg at 06/27/18 1012  . levofloxacin (LEVAQUIN) IVPB 500 mg  500 mg Intravenous Q24H Orson Eva, MD   Stopped at 06/26/18 2017  . loperamide (IMODIUM) capsule 2 mg  2 mg Oral PRN Sinda Du, MD      . multivitamin with minerals tablet 1 tablet  1 tablet Oral Daily Tat, David, MD   1 tablet at 06/27/18 1012  . niacin (SLO-NIACIN) CR tablet 500 mg  500 mg Oral Benay Pike, MD   500 mg at 06/26/18 2201  . omega-3 acid ethyl esters (LOVAZA) capsule 1 g  1 capsule Oral Daily Tat, David, MD   1 g at 06/27/18 1012  . ondansetron (ZOFRAN) tablet 4 mg  4 mg Oral Q6H PRN Tat, David, MD       Or  . ondansetron (ZOFRAN) injection 4 mg  4 mg Intravenous Q6H PRN Tat, Shanon Brow, MD   4 mg at 06/27/18 1005  . pantoprazole (PROTONIX) EC tablet 40 mg  40 mg Oral Daily Tat, David, MD   40 mg at 06/27/18 1012  . traMADol (ULTRAM) tablet 50 mg  50 mg Oral Q6H PRN Sinda Du, MD   50 mg at 06/27/18 1012  . vitamin C (ASCORBIC ACID) tablet 1,000 mg  1,000 mg Oral BID Orson Eva, MD   1,000 mg at 06/27/18 1012     Discharge Medications: Please see discharge summary for a list of discharge medications.  Relevant Imaging Results:  Relevant Lab Results:   Additional Information SSN [id number]  Damarys Speir, Chauncey Reading, RN

## 2018-06-27 NOTE — Progress Notes (Signed)
Subjective: This is an 83 year old who has multiple medical problems including severe COPD and mesenteric ischemia.  She had called my office last week after having had a fall and she had a COPD exacerbation.  She was started on steroids and antibiotics and I called her again the next day and she was substantially better.  However she said that it never did completely clear and then got worse.  She said that she twisted and feels like she may have fractured her pelvis at that time.  She did not have another fall.  Her pain became so severe that she came to the emergency department and she was found to have bilateral pneumonia and a pelvic fracture.  She is still having pain.  Objective: Vital signs in last 24 hours: Temp:  [97.8 F (36.6 C)-98.6 F (37 C)] 98.6 F (37 C) (05/07 0500) Pulse Rate:  [80-91] 83 (05/07 0500) Resp:  [18-24] 20 (05/07 0500) BP: (126-202)/(52-102) 126/52 (05/07 0500) SpO2:  [93 %-100 %] 94 % (05/07 0500) Weight:  [47.6 kg-49.5 kg] 49.5 kg (05/06 1700) Weight change:  Last BM Date: 06/26/18  Intake/Output from previous day: 05/06 0701 - 05/07 0700 In: 2387.6 [P.O.:458; I.V.:729.6; IV Piggyback:1200] Out: -   PHYSICAL EXAM General appearance: alert, cooperative and mild distress Resp: rhonchi bilaterally Cardio: regular rate and rhythm, S1, S2 normal, no murmur, click, rub or gallop GI: soft, non-tender; bowel sounds normal; no masses,  no organomegaly Extremities: extremities normal, atraumatic, no cyanosis or edema  Lab Results:  Results for orders placed or performed during the hospital encounter of 06/26/18 (from the past 48 hour(s))  Lactic acid, plasma     Status: Abnormal   Collection Time: 06/26/18 12:23 PM  Result Value Ref Range   Lactic Acid, Venous 2.3 (HH) 0.5 - 1.9 mmol/L    Comment: CRITICAL RESULT CALLED TO, READ BACK BY AND VERIFIED WITH: KEMPT,C AT 1247 ON 5.6.20 BY ISLEY,B Performed at [facility], [address] [place], [place]  [address]   Blood Culture (routine x 2)     Status: None (Preliminary result)   Collection Time: 06/26/18 12:23 PM  Result Value Ref Range   Specimen Description LEFT ANTECUBITAL    Special Requests      Blood Culture results may not be optimal due to an excessive volume of blood received in culture bottles   Culture      NO GROWTH < 24 HOURS Performed at [facility], [address] [place], [place] [address]    Report Status PENDING   Blood Culture (routine x 2)     Status: None (Preliminary result)   Collection Time: 06/26/18 12:23 PM  Result Value Ref Range   Specimen Description RIGHT ANTECUBITAL    Special Requests      Blood Culture results may not be optimal due to an excessive volume of blood received in culture bottles   Culture      NO GROWTH < 24 HOURS Performed at [facility], [address], [place], [place] [address]    Report Status PENDING   SARS Coronavirus 2 [facility] order, Performed in [place] hospital lab)     Status: None   Collection Time: 06/26/18 12:24 PM  Result Value Ref Range   SARS Coronavirus 2 NEGATIVE NEGATIVE    Comment: (NOTE) If result is NEGATIVE SARS-CoV-2 target nucleic acids are NOT DETECTED. The SARS-CoV-2 RNA is generally detectable in upper and lower  respiratory specimens during the acute phase of infection. The  lowest  concentration of SARS-CoV-2 viral copies this assay can detect is 250  copies / mL. A negative result does not preclude SARS-CoV-2 infection  and should not be used as the sole basis for treatment or other  patient management decisions.  A negative result may occur with  improper specimen collection / handling, submission of specimen other  than nasopharyngeal swab, presence of viral mutation(s) within the  areas targeted by this assay, and inadequate number of viral copies  (<250 copies / mL). A negative result must be combined with clinical  observations, patient history, and epidemiological information. If result  is POSITIVE SARS-CoV-2 target nucleic acids are DETECTED. The SARS-CoV-2 RNA is generally detectable in upper and lower  respiratory specimens dur ing the acute phase of infection.  Positive  results are indicative of active infection with SARS-CoV-2.  Clinical  correlation with patient history and other diagnostic information is  necessary to determine patient infection status.  Positive results do  not rule out bacterial infection or co-infection with other viruses. If result is PRESUMPTIVE POSTIVE SARS-CoV-2 nucleic acids MAY BE PRESENT.   A presumptive positive result was obtained on the submitted specimen  and confirmed on repeat testing.  While 2019 novel coronavirus  (SARS-CoV-2) nucleic acids may be present in the submitted sample  additional confirmatory testing may be necessary for epidemiological  and / or clinical management purposes  to differentiate between  SARS-CoV-2 and other Sarbecovirus currently known to infect humans.  If clinically indicated additional testing with an alternate test  methodology [phone]) is advised. The SARS-CoV-2 RNA is generally  detectable in upper and lower respiratory sp ecimens during the acute  phase of infection. The expected result is Negative. Fact Sheet for Patients:  StrictlyIdeas.no Fact Sheet for Healthcare Providers: BankingDealers.co.za This test is not yet approved or cleared by the [place] FDA and has been authorized for detection and/or diagnosis of SARS-CoV-2 by FDA under an Emergency Use Authorization (EUA).  This EUA will remain in effect (meaning this test can be used) for the duration of the COVID-19 declaration under Section 564(b)(1) of the Act, 21 U.S.C. section 360bbb-3(b)(1), unless the authorization is terminated or revoked sooner. Performed at [facility], [address], [place], [place] [address]   CBC WITH DIFFERENTIAL     Status: Abnormal   Collection  Time: 06/26/18 12:24 PM  Result Value Ref Range   WBC 23.3 (H) 4.0 - 10.5 K/uL   RBC 4.74 3.87 - 5.11 MIL/uL   Hemoglobin 14.7 12.0 - 15.0 g/dL   HCT 43.3 36.0 - 46.0 %   MCV 91.4 80.0 - 100.0 fL   MCH 31.0 26.0 - 34.0 pg   MCHC 33.9 30.0 - 36.0 g/dL   RDW 15.4 11.5 - 15.5 %   Platelets 576 (H) 150 - 400 K/uL   nRBC 0.0 0.0 - 0.2 %   Neutrophils Relative % 92 %   Neutro Abs 21.2 (H) 1.7 - 7.7 K/uL   Lymphocytes Relative 3 %   Lymphs Abs 0.7 0.7 - 4.0 K/uL   Monocytes Relative 4 %   Monocytes Absolute 1.0 0.1 - 1.0 K/uL   Eosinophils Relative 0 %   Eosinophils Absolute 0.0 0.0 - 0.5 K/uL   Basophils Relative 0 %   Basophils Absolute 0.1 0.0 - 0.1 K/uL   Immature Granulocytes 1 %   Abs Immature Granulocytes 0.26 (H) 0.00 - 0.07 K/uL    Comment: Performed at [facility], [address] [place], [place]  [address]  Comprehensive metabolic panel     Status: Abnormal   Collection Time: 06/26/18 12:24 PM  Result Value Ref Range   Sodium 133 (L) 135 - 145 mmol/L   Potassium 3.5 3.5 - 5.1 mmol/L   Chloride 89 (L) 98 - 111 mmol/L   CO2 28 22 - 32 mmol/L   Glucose, Bld 131 (H) 70 - 99 mg/dL   BUN 21 8 - 23 mg/dL   Creatinine, Ser 0.75 0.44 - 1.00 mg/dL   Calcium 7.6 (L) 8.9 - 10.3 mg/dL   Total Protein 7.5 6.5 - 8.1 g/dL   Albumin 4.2 3.5 - 5.0 g/dL   AST 27 15 - 41 U/L   ALT 37 0 - 44 U/L   Alkaline Phosphatase 108 38 - 126 U/L   Total Bilirubin 1.4 (H) 0.3 - 1.2 mg/dL   GFR calc non Af Amer >60 >60 mL/min   GFR calc Af Amer >60 >60 mL/min   Anion gap 16 (H) 5 - 15    Comment: Performed at [facility], [address], [place], [place] [address]  D-dimer, quantitative     Status: Abnormal   Collection Time: 06/26/18 12:24 PM  Result Value Ref Range   D-Dimer, Quant 3.12 (H) 0.00 - 0.50 ug/mL-FEU    Comment: (NOTE) At the manufacturer cut-off of 0.50 ug/mL FEU, this assay has been documented to exclude PE with a sensitivity and negative predictive value of 97 to 99%.  At this  time, this assay has not been approved by the FDA to exclude DVT/VTE. Results should be correlated with clinical presentation. Performed at [facility], [address] [place],  [address]   Procalcitonin     Status: None   Collection Time: 06/26/18 12:24 PM  Result Value Ref Range   Procalcitonin <0.10 ng/mL    Comment:        Interpretation: PCT (Procalcitonin) <= 0.5 ng/mL: Systemic infection (sepsis) is not likely. Local bacterial infection is possible. (NOTE)       Sepsis PCT Algorithm           Lower Respiratory Tract                                      Infection PCT Algorithm    ----------------------------     ----------------------------         PCT < 0.25 ng/mL                PCT < 0.10 ng/mL         Strongly encourage             Strongly discourage   discontinuation of antibiotics    initiation of antibiotics    ----------------------------     -----------------------------       PCT 0.25 - 0.50 ng/mL            PCT 0.10 - 0.25 ng/mL               OR       >80% decrease in PCT            Discourage initiation of                                            antibiotics  Encourage discontinuation           of antibiotics    ----------------------------     -----------------------------         PCT >= 0.50 ng/mL              PCT 0.26 - 0.50 ng/mL               AND        <80% decrease in PCT             Encourage initiation of                                             antibiotics       Encourage continuation           of antibiotics    ----------------------------     -----------------------------        PCT >= 0.50 ng/mL                  PCT > 0.50 ng/mL               AND         increase in PCT                  Strongly encourage                                      initiation of antibiotics    Strongly encourage escalation           of antibiotics                                     -----------------------------                                            PCT <= 0.25 ng/mL                                                 OR                                        > 80% decrease in PCT                                     Discontinue / Do not initiate                                             antibiotics Performed at [facility], [address] [place],  [address]   Lactate dehydrogenase     Status: Abnormal   Collection Time: 06/26/18 12:24 PM  Result Value Ref Range  LDH 307 (H) 98 - 192 U/L    Comment: Performed at [facility], [address] [place], [place] [address]  Fibrinogen     Status: None   Collection Time: 06/26/18 12:24 PM  Result Value Ref Range   Fibrinogen 377 210 - 475 mg/dL    Comment: Performed at [facility], [address], [place], [place] [address]  Ferritin     Status: None   Collection Time: 06/26/18 12:25 PM  Result Value Ref Range   Ferritin 67 11 - 307 ng/mL    Comment: Performed at [facility], [address] [place], [place] [address]  Triglycerides     Status: None   Collection Time: 06/26/18 12:25 PM  Result Value Ref Range   Triglycerides 56 <150 mg/dL    Comment: Performed at [facility], [address], [place], [place] [address]  C-reactive protein     Status: None   Collection Time: 06/26/18 12:25 PM  Result Value Ref Range   CRP <0.8 <1.0 mg/dL    Comment: Performed at [facility], [address], [place], [place] [address]  Lactic acid, plasma     Status: None   Collection Time: 06/26/18  6:03 PM  Result Value Ref Range   Lactic Acid, Venous 1.0 0.5 - 1.9 mmol/L    Comment: Performed at [facility], [address] [place], [place] [address]  Basic metabolic panel     Status: Abnormal   Collection Time: 06/27/18  5:18 AM  Result Value Ref Range   Sodium 132 (L) 135 - 145 mmol/L   Potassium 3.5 3.5 - 5.1 mmol/L   Chloride 97 (L) 98 - 111 mmol/L   CO2 26 22 - 32 mmol/L   Glucose, Bld 115 (H) 70 - 99 mg/dL   BUN 22 8 - 23 mg/dL   Creatinine, Ser 0.57 0.44 - 1.00 mg/dL    Calcium 6.6 (L) 8.9 - 10.3 mg/dL   GFR calc non Af Amer >60 >60 mL/min   GFR calc Af Amer >60 >60 mL/min   Anion gap 9 5 - 15    Comment: Performed at [facility], [address], [place], [place] [address]  CBC     Status: Abnormal   Collection Time: 06/27/18  5:18 AM  Result Value Ref Range   WBC 18.5 (H) 4.0 - 10.5 K/uL   RBC 3.77 (L) 3.87 - 5.11 MIL/uL   Hemoglobin 11.4 (L) 12.0 - 15.0 g/dL   HCT 34.8 (L) 36.0 - 46.0 %   MCV 92.3 80.0 - 100.0 fL   MCH 30.2 26.0 - 34.0 pg   MCHC 32.8 30.0 - 36.0 g/dL   RDW 15.1 11.5 - 15.5 %   Platelets 476 (H) 150 - 400 K/uL   nRBC 0.0 0.0 - 0.2 %    Comment: Performed at [facility], [address] [place], [place] [address]  Iron and TIBC     Status: None   Collection Time: 06/27/18  5:18 AM  Result Value Ref Range   Iron 83 28 - 170 ug/dL   TIBC 352 250 - 450 ug/dL   Saturation Ratios 24 10.4 - 31.8 %   UIBC 269 ug/dL    Comment: Performed at [facility], [address], [place], [place] [address]  Ferritin     Status: None   Collection Time: 06/27/18  5:18 AM  Result Value Ref Range   Ferritin 61 11 - 307 ng/mL    Comment: Performed at [facility], [address] [place], [place] [address]    ABGS No results for input(s): PHART, PO2ART, TCO2, HCO3 in the last 72 hours.  Invalid input(s): PCO2 CULTURES Recent Results (from the past 240 hour(s))  Blood Culture (routine x 2)     Status: None (Preliminary result)   Collection Time: 06/26/18 12:23 PM  Result Value Ref Range Status   Specimen Description LEFT ANTECUBITAL  Final   Special Requests   Final    Blood Culture results may not be optimal due to an excessive volume of blood received in culture bottles   Culture   Final    NO GROWTH < 24 HOURS Performed at [facility], [address], [place], [place] [address]    Report Status PENDING  Incomplete  Blood Culture (routine x 2)     Status: None (Preliminary result)   Collection Time: 06/26/18 12:23 PM  Result Value Ref  Range Status   Specimen Description RIGHT ANTECUBITAL  Final   Special Requests   Final    Blood Culture results may not be optimal due to an excessive volume of blood received in culture bottles   Culture   Final    NO GROWTH < 24 HOURS Performed at [facility], [address], [place], [place] [address]    Report Status PENDING  Incomplete  SARS Coronavirus 2 [facility] order, Performed in [place] hospital lab)     Status: None   Collection Time: 06/26/18 12:24 PM  Result Value Ref Range Status   SARS Coronavirus 2 NEGATIVE NEGATIVE Final    Comment: (NOTE) If result is NEGATIVE SARS-CoV-2 target nucleic acids are NOT DETECTED. The SARS-CoV-2 RNA is generally detectable in upper and lower  respiratory specimens during the acute phase of infection. The lowest  concentration of SARS-CoV-2 viral copies this assay can detect is 250  copies / mL. A negative result does not preclude SARS-CoV-2 infection  and should not be used as the sole basis for treatment or other  patient management decisions.  A negative result may occur with  improper specimen collection / handling, submission of specimen other  than nasopharyngeal swab, presence of viral mutation(s) within the  areas targeted by this assay, and inadequate number of viral copies  (<250 copies / mL). A negative result must be combined with clinical  observations, patient history, and epidemiological information. If result is POSITIVE SARS-CoV-2 target nucleic acids are DETECTED. The SARS-CoV-2 RNA is generally detectable in upper and lower  respiratory specimens dur ing the acute phase of infection.  Positive  results are indicative of active infection with SARS-CoV-2.  Clinical  correlation with patient history and other diagnostic information is  necessary to determine patient infection status.  Positive results do  not rule out bacterial infection or co-infection with other viruses. If result is PRESUMPTIVE  POSTIVE SARS-CoV-2 nucleic acids MAY BE PRESENT.   A presumptive positive result was obtained on the submitted specimen  and confirmed on repeat testing.  While 2019 novel coronavirus  (SARS-CoV-2) nucleic acids may be present in the submitted sample  additional confirmatory testing may be necessary for epidemiological  and / or clinical management purposes  to differentiate between  SARS-CoV-2 and other Sarbecovirus currently known to infect humans.  If clinically indicated additional testing with an alternate test  methodology [id number]) is advised. The SARS-CoV-2 RNA is generally  detectable in upper and lower respiratory sp ecimens during the acute  phase of infection. The expected result is Negative. Fact  Sheet for Patients:  StrictlyIdeas.no Fact Sheet for Healthcare Providers: BankingDealers.co.za This test is not yet approved or cleared by the [place] FDA and has been authorized for detection and/or diagnosis of SARS-CoV-2 by FDA under an Emergency Use Authorization (EUA).  This EUA will remain in effect (meaning this test can be used) for the duration of the COVID-19 declaration under Section 564(b)(1) of the Act, 21 U.S.C. section 360bbb-3(b)(1), unless the authorization is terminated or revoked sooner. Performed at [facility], [address] [place], [place] [address]    Studies/Results: Dg Pelvis 1-2 Views  Result Date: 06/26/2018 CLINICAL DATA:  Right groin pain secondary to a fall 5 days ago. Initial encounter. EXAM: PELVIS - 1-2 VIEW COMPARISON:  CT abdomen and pelvis 01/01/2018. FINDINGS: The patient has a nondisplaced fracture of the right inferior pubic ramus. No other acute fracture is identified. Remote healed right hip fracture with fixation hardware in place noted. Mild bilateral hip degenerative change is present. IMPRESSION: Acute nondisplaced right inferior pubic ramus fracture. Remote healed right hip fracture  with fixation hardware in place. Electronically Signed   By: Inge Rise M.D.   On: 06/26/2018 13:21   Dg Chest Portable 1 View  Result Date: 06/26/2018 CLINICAL DATA:  [place] yesterday.  Low oxygen saturation. EXAM: PORTABLE CHEST 1 VIEW COMPARISON:  11/18/2016 FINDINGS: Heart size is normal. Chronic atherosclerosis of the aorta. Pulmonary infiltrates most pronounced in the right upper lobe and left lower lobe. Some possibility there could be mild pulmonary edema, but pneumonia is favored. No visible effusion. Previous left shoulder replacement. Old rib fracture on the left. IMPRESSION: Bilateral infiltrates, most pronounced in the right upper lobe and left lower lobe, most consistent with pneumonia. Electronically Signed   By: Nelson Chimes M.D.   On: 06/26/2018 10:59    Medications:  Prior to Admission:  Medications Prior to Admission  Medication Sig Dispense Refill Last Dose  . Ascorbic Acid (VITAMIN C) 1000 MG tablet Take 1,000 mg by mouth 2 (two) times daily.    06/25/2018 at Unknown time  . aspirin EC 81 MG tablet Take 81 mg by mouth daily.   06/25/2018 at Unknown time  . Calcium Carb-Cholecalciferol (CALCIUM 600 + D PO) Take 1-2 tablets by mouth See admin instructions. 2 tabs in the morning, and 1 tab at [facility]   06/25/2018 at Unknown time  . diltiazem (CARDIZEM CD) 240 MG 24 hr capsule TAKE 1 CAPSULE(240 MG) BY MOUTH DAILY 60 capsule 0 06/25/2018 at Unknown time  . diphenhydrAMINE (BENADRYL) 25 mg capsule Take 25 mg by mouth at bedtime.   06/25/2018 at Unknown time  . DULoxetine (CYMBALTA) 30 MG capsule Take 30 mg by mouth daily.    06/25/2018 at Unknown time  . Flaxseed, Linseed, (FLAXSEED OIL) 1000 MG CAPS Take 1 capsule by mouth 2 (two) times daily.   06/25/2018 at Unknown time  . Multiple Vitamin (MULTIVITAMIN WITH MINERALS) TABS Take 1 tablet by mouth daily.   06/25/2018 at Unknown time  . niacin 500 MG CR capsule Take 500 mg by mouth at bedtime.   06/25/2018 at Unknown time  . Omega-3 1000 MG CAPS  Take 1 capsule by mouth daily.   06/25/2018 at Unknown time  . pantoprazole (PROTONIX) 40 MG tablet Take 1 tablet (40 mg total) by mouth daily. 30 tablet 11 06/25/2018 at Unknown time   Scheduled: . aspirin EC  81 mg Oral Daily  . diltiazem  240 mg Oral Daily  . DULoxetine  30 mg  Oral Daily  . enoxaparin (LOVENOX) injection  40 mg Subcutaneous Q24H  . feeding supplement (ENSURE ENLIVE)  237 mL Oral BID BM  . guaiFENesin  1,200 mg Oral BID  . multivitamin with minerals  1 tablet Oral Daily  . niacin  500 mg Oral QHS  . omega-3 acid ethyl esters  1 capsule Oral Daily  . pantoprazole  40 mg Oral Daily  . vitamin C  1,000 mg Oral BID   Continuous: . 0.9 % NaCl with KCl 20 mEq / L 75 mL/hr at 06/27/18 0600  . levofloxacin [facility]) IV Stopped (06/26/18 2017)   ZRA:[id number] **OR** acetaminophen, loperamide, ondansetron **OR** ondansetron (ZOFRAN) IV, traMADol  Assesment: She has pneumonia and was felt to be septic on admission.  Sepsis pathophysiology has resolved.  She is being treated with antibiotics and she says she feels a little better  She has severe COPD at baseline and this is worse.  She has continued tobacco abuse unchanged  She has hypertension and she is on diltiazem for that  She has a closed fracture of the pelvis and will have orthopedic consultation.  PT evaluation as well.  She has depression at baseline which is stable  She has had DNR status and wants to continue that so I have changed her status here Active Problems:   Tobacco use disorder   Sepsis due to undetermined organism ([place])   Acute respiratory failure with hypoxia ([place])   Lobar pneumonia ([place])   Closed fracture of multiple pubic rami, right, with delayed healing, subsequent encounter    Plan: DNR.  Continue other treatments.  Orthopedic consultation.  Continue antibiotics.  PT evaluation    LOS: 1 day   Alonza Bogus 06/27/2018, 8:44 AM

## 2018-06-27 NOTE — Care Management (Signed)
Insurance Authorization Complete, auth # [id number]. Patient can DC to [facility] when medically ready.

## 2018-06-27 NOTE — Progress Notes (Signed)
Initial Nutrition Assessment  RD working remotely.   DOCUMENTATION CODES:     INTERVENTION:   D/c Ensure BID  Start Glucerna Shake po BID-between meals, each supplement provides 220 kcal and 10 grams of protein. Patient is not diabetic but prefers a lower sugar option.   NUTRITION DIAGNOSIS:   Increased nutrient needs related to acute illness(sepsis and acute pubic ramus fracture) as evidenced by estimated needs, meal completion < 50%(meal completion self-reported as < 50% per pt) which meets < 50% of est energy/protein needs.   GOAL:   Patient will meet greater than or equal to 90% of their needs  MONITOR:   PO intake, Supplement acceptance, Weight trends, Labs   REASON FOR ASSESSMENT:   Malnutrition Screening Tool    ASSESSMENT: Patient is an 83 yo female with history of GERD, Constipation, COPD, Stroke and depression. She presents with right groin pain following a fall at home. Patient  found to have pneumonia and a pubic ramus fracture.   Patient describes appetite as poor and reports meal intake today <50% of breakfast or lunch. She likes ice cream, activia yogurt and chocolate milk. Patient has tried Ensure in the past but complains too sweet. No additional food preferences to offer currently but is willing to try a less sweet supplement option given her poor appetite and meal intake. Nutritional services will continue to offer alternate meal choices. Patient ordered a salad for lunch today but only ate a few bites but did enjoy her ice cream.   Patient has inadequate intake and is at risk for development of malnutrition given her current increased metabolic needs, acute injury and pneumonia.  Patient has a distant history of weight loss prior to December 2018. She has maintained a stable weight between 46-50 kg for more than 2 years. In December of 2018- she weighed 49.9 kg.    Medications reviewed and include: MVI, Niacin, Protonix, Vitamin C, Levaquin   Labs:  hyponatremia, hypoalbuminemia.  BMP Latest Ref Rng & Units 06/27/2018 06/26/2018 01/21/2018  Glucose 70 - 99 mg/dL 115(H) 131(H) 126(H)  BUN 8 - 23 mg/dL 22 21 15   Creatinine 0.44 - 1.00 mg/dL 0.57 0.75 0.78  Sodium 135 - 145 mmol/L 132(L) 133(L) 131(L)  Potassium 3.5 - 5.1 mmol/L 3.5 3.5 3.8  Chloride 98 - 111 mmol/L 97(L) 89(L) 91(L)  CO2 22 - 32 mmol/L 26 28 29   Calcium 8.9 - 10.3 mg/dL 6.6(L) 7.6(L) 9.0     NUTRITION - FOCUSED PHYSICAL EXAM:  Unable to complete Nutrition-Focused physical exam at this time.    Diet Order:   Diet Order            Diet Heart Room service appropriate? Yes; Fluid consistency: Thin  Diet effective now              EDUCATION NEEDS:   Education needs have been addressed(patient plans to d/c to snf and will not be preparing her own meals) Skin:  Skin Assessment: Reviewed RN Assessment  Last BM:  5/6  Height:   Ht Readings from Last 1 Encounters:  06/26/18 5' 1.5" (1.562 m)    Weight:   Wt Readings from Last 1 Encounters:  06/26/18 49.5 kg    Ideal Body Weight:  50 kg  BMI:  Body mass index is 20.29 kg/m.  Estimated Nutritional Needs:   Kcal:  1400-1500 (28-30 kcal/kg/bw)  Protein:  70-80 gr (1.4-1.6 gr/kg/bw)  Fluid:  >1300 ml daily   Colman Cater MS,RD,CSG,LDN Office: [phone] Pager: [phone]

## 2018-06-27 NOTE — Evaluation (Signed)
Physical Therapy Evaluation Patient Details Name: [name] MRN: [id number] DOB: 04/20/1935 Today's Date: 06/27/2018   History of Present Illness  [name] is a 83 y.o. female with medical history of COPD, hypertension, celiac stenosis, depression, GERD, hypothyroidism, stroke presenting with right groin pain that began on 06/21/2018.  Patient states that she was bringing her groceries and when she bent over to pull a weed and slipped backwards.  She felt like she was able to catch her self before actually having an actual fall.  However after she woke up from a nap on the afternoon of 06/21/2018, the patient complained of right groin pain resulting in difficulty lifting her right leg.  Over the weekend, the patient complained of increasing generalized weakness without any fevers, chills, nausea, vomiting, diarrhea.  The patient states that she has had coughing and shortness of breath for the better part of a week.  She stated that she called her PCP, Dr. Luan Pulling.  She was placed on azithromycin and prednisone which she states she began taking on 06/17/2018.  She finished azithromycin on 06/21/2018.  Today was supposed to be her last day of prednisone.  She stated that her shortness of breath is somewhat improved, although she continued to have a nonproductive cough without hemoptysis.  She denied any close contacts or recent travels.  Because of increasing pain in her right groin, EMS was activated.  The patient was noted to have oxygen saturation of 82% on room air.  She was placed on supplemental oxygen at 2 L with improvement of her saturation 95%.    Clinical Impression  Patient c/o stomach ache throughout treatment and had to transfer to [facility] for a bowel movement prior to attempting gait training, demonstrates slow labored movement, once fatigued during ambulation had to reach for hand held assist and required use of RW to return back to bedside safely.  Patient on room air throughout  ambulation with SpO2 at 93-95%, but requested to be put back on O2 due to not feeling well.  Patient tolerated sitting up in chair after therapy and RN notified that IV bleeding at insertion.  Patient will benefit from continued physical therapy in hospital and recommended venue below to increase strength, balance, endurance for safe ADLs and gait.    Follow Up Recommendations SNF;Supervision for mobility/OOB;Supervision - Intermittent    Equipment Recommendations  Rolling walker with 5" wheels    Recommendations for Other Services       Precautions / Restrictions Precautions Precautions: Fall Restrictions Weight Bearing Restrictions: Yes RLE Weight Bearing: Weight bearing as tolerated      Mobility  Bed Mobility Overal bed mobility: Needs Assistance Bed Mobility: Supine to Sit;Sit to Supine     Supine to sit: Supervision Sit to supine: Supervision   General bed mobility comments: slow labored movement  Transfers Overall transfer level: Needs assistance Equipment used: Rolling walker (2 wheeled) Transfers: Sit to/from [facility] Sit to Stand: Min guard;Min assist Stand pivot transfers: Min assist       General transfer comment: increased time, labored movement  Ambulation/Gait Ambulation/Gait assistance: Min assist Gait Distance (Feet): 45 Feet Assistive device: Rolling walker (2 wheeled) Gait Pattern/deviations: Decreased step length - right;Decreased step length - left;Decreased stance time - left;Step-to pattern Gait velocity: decreased    General Gait Details: slow labored cadence with mostly 3 point gait pattern using quad cane, once fatigued had to reach for assistance to maintain standing balance, had to use RW for safety to  make it back to room  Stairs            Wheelchair Mobility    Modified Rankin (Stroke Patients Only)       Balance Overall balance assessment: Needs assistance Sitting-balance support: Feet supported;No  upper extremity supported Sitting balance-Leahy Scale: Fair     Standing balance support: Single extremity supported;During functional activity Standing balance-Leahy Scale: Fair Standing balance comment: fair/poor using quad-cane, fair using RW                             Pertinent Vitals/Pain Pain Assessment: 0-10 Pain Score: 7  Pain Location: mostly abdomen, some in right groin area Pain Descriptors / Indicators: Aching;Discomfort Pain Intervention(s): Limited activity within patient's tolerance;Monitored during session;Premedicated before session    Home Living Family/patient expects to be discharged to:: Private residence Living Arrangements: Alone Available Help at Discharge: Neighbor Type of Home: House Home Access: Stairs to enter Entrance Stairs-Rails: Right Entrance Stairs-Number of Steps: 2 Home Layout: One level Home Equipment: Gail - quad;Shower seat;Bedside commode      Prior Function Level of Independence: Independent with assistive device(s)         Comments: Household ambulator without AD, community distances using quad cane, drives, does her own shopping     Hand Dominance   Dominant Hand: Left    Extremity/Trunk Assessment   Upper Extremity Assessment Upper Extremity Assessment: Generalized weakness    Lower Extremity Assessment Lower Extremity Assessment: Generalized weakness;RLE deficits/detail;LLE deficits/detail RLE Deficits / Details: grossly 4/5 LLE Deficits / Details: grossly -4/5    Cervical / Trunk Assessment Cervical / Trunk Assessment: Normal  Communication   Communication: No difficulties  Cognition Arousal/Alertness: Awake/alert Behavior During Therapy: WFL for tasks assessed/performed Overall Cognitive Status: Within Functional Limits for tasks assessed                                        General Comments      Exercises     Assessment/Plan    PT Assessment Patient needs continued PT  services  PT Problem List Decreased strength;Decreased activity tolerance;Decreased balance;Decreased mobility       PT Treatment Interventions Therapeutic exercise;Gait training;Stair training;Functional mobility training;Therapeutic activities;Patient/family education    PT Goals (Current goals can be found in the Care Plan section)  Acute Rehab PT Goals Patient Stated Goal: return home with neighbor to assist PT Goal Formulation: With patient Time For Goal Achievement: 07/11/18 Potential to Achieve Goals: Good    Frequency Min 3X/week   Barriers to discharge        Co-evaluation               AM-PAC PT "6 Clicks" Mobility  Outcome Measure Help needed turning from your back to your side while in a flat bed without using bedrails?: None Help needed moving from lying on your back to sitting on the side of a flat bed without using bedrails?: None Help needed moving to and from a bed to a chair (including a wheelchair)?: A Little Help needed standing up from a chair using your arms (e.g., wheelchair or bedside chair)?: A Little Help needed to walk in hospital room?: A Little Help needed climbing 3-5 steps with a railing? : A Lot 6 Click Score: 19    End of Session Equipment Utilized During Treatment: Gait belt Activity Tolerance:  Patient tolerated treatment well;Patient limited by fatigue Patient left: in chair;with call bell/phone within reach;with chair alarm set   PT Visit Diagnosis: Unsteadiness on feet (R26.81);Other abnormalities of gait and mobility (R26.89);Muscle weakness (generalized) (M62.81)    Time: 2595-6387 PT Time Calculation (min) (ACUTE ONLY): 38 min   Charges:   PT Evaluation $PT Eval Moderate Complexity: 1 Mod PT Treatments $Gait Training: 8-22 mins $Therapeutic Activity: 8-22 mins        10:18 AM, 06/27/18 Lonell Grandchild, MPT Physical Therapist with [facility] [phone] office [phone] mobile phone

## 2018-06-27 NOTE — TOC Initial Note (Signed)
Transition of Care [facility]) - Initial/Assessment Note    Patient Details  Name: [name] MRN: [id number] Date of Birth: [birth year]  Transition of [facility]) CM/SW Contact:    Ryiah Bellissimo, Chauncey Reading, RN Phone Number: 06/27/2018, 10:39 AM  Clinical Narrative:    Pneumonia, closed fracture of the pelvis and will have orthopedic consultation.  From home alone, has children that live out of town. She is independent, has RW and cane at home.  Still drives, relies on neighbors and children for support.   Seen by PT recommended for SNF, patient agreeable. Has been to [facility] in the past and would like to go there again.   Will refer today, anticipate DC after ortho consult/recommendation complete and when medically ready after treatment for pneumonia.   Expected Discharge Plan: Skilled Nursing Facility Barriers to Discharge: No Barriers Identified   Patient Goals and CMS Choice Patient states their goals for this hospitalization and ongoing recovery are:: go to rehab and return home CMS Medicare.gov Compare Post Acute Care list provided to:: Patient Choice offered to / list presented to : Patient  Expected Discharge Plan and Services Expected Discharge Plan: [place]   Discharge Planning Services: CM Consult Post Acute Care Choice: [place] Living arrangements for the past 2 months: Single Family Home Expected Discharge Date: 06/30/18                   Prior Living Arrangements/Services Living arrangements for the past 2 months: Single Family Home Lives with:: Self Patient language and need for interpreter reviewed:: Yes Do you feel safe going back to the place where you live?: Yes      Need for Family Participation in Patient Care: Yes (Comment) Care giver support system in place?: Yes (comment) Current home services: DME(cane, RW) Criminal Activity/Legal Involvement Pertinent to Current Situation/Hospitalization: No - Comment as  needed  Activities of Daily Living Home Assistive Devices/Equipment: Cane (specify quad or straight)(quad) ADL Screening (condition at time of admission) Patient's cognitive ability adequate to safely complete daily activities?: Yes Is the patient deaf or have difficulty hearing?: No Does the patient have difficulty seeing, even when wearing glasses/contacts?: No Does the patient have difficulty concentrating, remembering, or making decisions?: No Patient able to express need for assistance with ADLs?: Yes Does the patient have difficulty dressing or bathing?: No Independently performs ADLs?: Yes (appropriate for developmental age) Does the patient have difficulty walking or climbing stairs?: Yes Weakness of Legs: Both Weakness of Arms/Hands: Both  Permission Sought/Granted Permission sought to share information with : Facility Art therapist granted to share information with : Yes, Verbal Permission Granted     Permission granted to share info w AGENCY: [facility]        Emotional Assessment Appearance:: Appears stated age Attitude/Demeanor/Rapport: Engaged Affect (typically observed): Accepting, Calm Orientation: : Oriented to Self, Oriented to Place, Oriented to  Time Alcohol / Substance Use: Not Applicable Psych Involvement: No (comment)  Admission diagnosis:  Closed fracture of ramus of right pubis, initial encounter ([place]) [S32.591A] Community acquired pneumonia, unspecified laterality [J18.9] Patient Active Problem List   Diagnosis Date Noted  . Sepsis due to undetermined organism ([place]) 06/26/2018  . Acute respiratory failure with hypoxia ([place]) 06/26/2018  . Lobar pneumonia ([place]) 06/26/2018  . Closed fracture of multiple pubic rami, right, with delayed healing, subsequent encounter 06/26/2018  . Closed fracture of ramus of right pubis ([place])   . Acute colitis 12/02/2016  . Uncontrolled hypertension 12/02/2016  .  Enteritis 12/02/2016  . Hyponatremia  12/02/2016  . Status post total shoulder arthroplasty, left 11/21/2016  . COPD (chronic obstructive pulmonary disease) ([place]) 11/21/2016  . Weakness 11/19/2016  . DJD of left shoulder 11/15/2016  . DOE (dyspnea on exertion) 11/11/2016  . Rotator cuff arthropathy of left shoulder 10/30/2016  . Intertrochanteric fracture of right hip ([place]) 08/01/2015    Class: Acute  . SMA stenosis ([place]) 09/15/2014  . Abdominal pain, right upper quadrant 09/15/2014  . Essential hypertension 09/15/2014  . Depression 09/15/2014  . Hypothyroid 09/15/2014  . GERD without esophagitis 09/15/2014  . Lumbago 11/12/2013  . Stiffness of joint, not elsewhere classified, pelvic region and thigh 11/12/2013  . Muscle weakness (generalized) 11/12/2013  . Lack of coordination 03/21/2013  . Fine motor impairment 03/21/2013  . Left leg weakness 03/17/2013  . Risk for falls 03/17/2013  . Carotid artery disease ([place]) 03/11/2013  . Subclavian artery stenosis ([place]) 03/11/2013  . TIA (transient ischemic attack) 03/11/2013  . Tobacco use disorder 03/11/2013  . CVA (cerebral infarction) 03/05/2013  . Pain in joint, shoulder region 11/01/2010   PCP:  Sinda Du, MD Pharmacy:   [facility] [place] - [address] AT [place] [place] [address] Phone: [phone] Fax: [phone]  Readmission Risk Interventions No flowsheet data found.

## 2018-06-27 NOTE — TOC Progression Note (Signed)
Transition of Care [facility]) - Progression Note    Patient Details  Name: [name] MRN: [id number] Date of Birth: 1935-10-19  [facility]) CM/SW Contact  Caitlyne Ingham, Chauncey Reading, RN Phone Number: 06/27/2018, 1:06 PM  Clinical Narrative:   Patient has been accepted to [facility]. CM has started authorization process.     Expected Discharge Plan: [place] Barriers to Discharge: No Barriers Identified  Expected Discharge Plan and Services Expected Discharge Plan: [place]   Discharge Planning Services: CM Consult Post Acute Care Choice: [place] Living arrangements for the past 2 months: Single Family Home Expected Discharge Date: 06/30/18                 Readmission Risk Interventions No flowsheet data found.

## 2018-06-27 NOTE — Plan of Care (Signed)
  Problem: Acute Rehab PT Goals(only PT should resolve) Goal: Pt Will Go Supine/Side To Sit Outcome: Progressing Flowsheets (Taken 06/27/2018 1020) Pt will go Supine/Side to Sit: with modified independence Goal: Patient Will Transfer Sit To/From Stand Outcome: Progressing Flowsheets (Taken 06/27/2018 1020) Patient will transfer sit to/from stand: with modified independence Goal: Pt Will Transfer Bed To Chair/Chair To Bed Outcome: Progressing Flowsheets (Taken 06/27/2018 1020) Pt will Transfer Bed to Chair/Chair to Bed: with supervision Goal: Pt Will Ambulate Outcome: Progressing Flowsheets (Taken 06/27/2018 1020) Pt will Ambulate: with min guard assist; 75 feet; with rolling walker; with cane   10:21 AM, 06/27/18 Lonell Grandchild, MPT Physical Therapist with [facility] [phone] office [phone] mobile phone

## 2018-06-28 NOTE — Progress Notes (Signed)
Subjective: She says she feels okay.  She is still coughing.  She is still congested.  Still very weak.  It has been recommended that she go to skilled care facility  Objective: Vital signs in last 24 hours: Temp:  [97.9 F (36.6 C)-98.2 F (36.8 C)] 98.2 F (36.8 C) (05/08 0457) Pulse Rate:  [71-84] 71 (05/08 0457) Resp:  [16-17] 16 (05/08 0457) BP: (118-165)/(53-62) 118/53 (05/08 0457) SpO2:  [93 %-96 %] 96 % (05/08 0457) Weight change:  Last BM Date: 06/27/18  Intake/Output from previous day: 05/07 0701 - 05/08 0700 In: 1534.5 [P.O.:720; I.V.:714.5; IV Piggyback:100] Out: 1 [Emesis/NG output:1]  PHYSICAL EXAM General appearance: alert, cooperative and no distress Resp: rhonchi bilaterally Cardio: regular rate and rhythm, S1, S2 normal, no murmur, click, rub or gallop GI: soft, non-tender; bowel sounds normal; no masses,  no organomegaly Extremities: extremities normal, atraumatic, no cyanosis or edema  Lab Results:  Results for orders placed or performed during the hospital encounter of 06/26/18 (from the past 48 hour(s))  Lactic acid, plasma     Status: Abnormal   Collection Time: 06/26/18 12:23 PM  Result Value Ref Range   Lactic Acid, Venous 2.3 (HH) 0.5 - 1.9 mmol/L    Comment: CRITICAL RESULT CALLED TO, READ BACK BY AND VERIFIED WITH: KEMPT,C AT 1247 ON 5.6.20 BY ISLEY,B Performed at [facility], [address], [place], [place] [address]   Blood Culture (routine x 2)     Status: None (Preliminary result)   Collection Time: 06/26/18 12:23 PM  Result Value Ref Range   Specimen Description LEFT ANTECUBITAL    Special Requests      Blood Culture results may not be optimal due to an excessive volume of blood received in culture bottles   Culture      NO GROWTH 2 DAYS Performed at [facility], [address] [place], [place] [address]    Report Status PENDING   Blood Culture (routine x 2)     Status: None (Preliminary result)   Collection Time: 06/26/18 12:23 PM   Result Value Ref Range   Specimen Description RIGHT ANTECUBITAL    Special Requests      Blood Culture results may not be optimal due to an excessive volume of blood received in culture bottles   Culture      NO GROWTH 2 DAYS Performed at [facility], [address] [place], [place] [address]    Report Status PENDING   SARS Coronavirus 2 [facility] order, Performed in [place] hospital lab)     Status: None   Collection Time: 06/26/18 12:24 PM  Result Value Ref Range   SARS Coronavirus 2 NEGATIVE NEGATIVE    Comment: (NOTE) If result is NEGATIVE SARS-CoV-2 target nucleic acids are NOT DETECTED. The SARS-CoV-2 RNA is generally detectable in upper and lower  respiratory specimens during the acute phase of infection. The lowest  concentration of SARS-CoV-2 viral copies this assay can detect is 250  copies / mL. A negative result does not preclude SARS-CoV-2 infection  and should not be used as the sole basis for treatment or other  patient management decisions.  A negative result may occur with  improper specimen collection / handling, submission of specimen other  than nasopharyngeal swab, presence of viral mutation(s) within the  areas targeted by this assay, and inadequate number of viral copies  (<250 copies / mL). A negative result must be combined with clinical  observations, patient history, and epidemiological information. If result  is POSITIVE SARS-CoV-2 target nucleic acids are DETECTED. The SARS-CoV-2 RNA is generally detectable in upper and lower  respiratory specimens dur ing the acute phase of infection.  Positive  results are indicative of active infection with SARS-CoV-2.  Clinical  correlation with patient history and other diagnostic information is  necessary to determine patient infection status.  Positive results do  not rule out bacterial infection or co-infection with other viruses. If result is PRESUMPTIVE POSTIVE SARS-CoV-2 nucleic acids MAY BE PRESENT.    A presumptive positive result was obtained on the submitted specimen  and confirmed on repeat testing.  While 2019 novel coronavirus  (SARS-CoV-2) nucleic acids may be present in the submitted sample  additional confirmatory testing may be necessary for epidemiological  and / or clinical management purposes  to differentiate between  SARS-CoV-2 and other Sarbecovirus currently known to infect humans.  If clinically indicated additional testing with an alternate test  methodology [phone]) is advised. The SARS-CoV-2 RNA is generally  detectable in upper and lower respiratory sp ecimens during the acute  phase of infection. The expected result is Negative. Fact Sheet for Patients:  StrictlyIdeas.no Fact Sheet for Healthcare Providers: BankingDealers.co.za This test is not yet approved or cleared by the [place] FDA and has been authorized for detection and/or diagnosis of SARS-CoV-2 by FDA under an Emergency Use Authorization (EUA).  This EUA will remain in effect (meaning this test can be used) for the duration of the COVID-19 declaration under Section 564(b)(1) of the Act, 21 U.S.C. section 360bbb-3(b)(1), unless the authorization is terminated or revoked sooner. Performed at [facility], [address], [place], [place] [address]   CBC WITH DIFFERENTIAL     Status: Abnormal   Collection Time: 06/26/18 12:24 PM  Result Value Ref Range   WBC 23.3 (H) 4.0 - 10.5 K/uL   RBC 4.74 3.87 - 5.11 MIL/uL   Hemoglobin 14.7 12.0 - 15.0 g/dL   HCT 43.3 36.0 - 46.0 %   MCV 91.4 80.0 - 100.0 fL   MCH 31.0 26.0 - 34.0 pg   MCHC 33.9 30.0 - 36.0 g/dL   RDW 15.4 11.5 - 15.5 %   Platelets 576 (H) 150 - 400 K/uL   nRBC 0.0 0.0 - 0.2 %   Neutrophils Relative % 92 %   Neutro Abs 21.2 (H) 1.7 - 7.7 K/uL   Lymphocytes Relative 3 %   Lymphs Abs 0.7 0.7 - 4.0 K/uL   Monocytes Relative 4 %   Monocytes Absolute 1.0 0.1 - 1.0 K/uL   Eosinophils  Relative 0 %   Eosinophils Absolute 0.0 0.0 - 0.5 K/uL   Basophils Relative 0 %   Basophils Absolute 0.1 0.0 - 0.1 K/uL   Immature Granulocytes 1 %   Abs Immature Granulocytes 0.26 (H) 0.00 - 0.07 K/uL    Comment: Performed at [facility], [address] [place], [place] [address]  Comprehensive metabolic panel     Status: Abnormal   Collection Time: 06/26/18 12:24 PM  Result Value Ref Range   Sodium 133 (L) 135 - 145 mmol/L   Potassium 3.5 3.5 - 5.1 mmol/L   Chloride 89 (L) 98 - 111 mmol/L   CO2 28 22 - 32 mmol/L   Glucose, Bld 131 (H) 70 - 99 mg/dL   BUN 21 8 - 23 mg/dL   Creatinine, Ser 0.75 0.44 - 1.00 mg/dL   Calcium 7.6 (L) 8.9 - 10.3 mg/dL   Total Protein 7.5 6.5 - 8.1 g/dL  Albumin 4.2 3.5 - 5.0 g/dL   AST 27 15 - 41 U/L   ALT 37 0 - 44 U/L   Alkaline Phosphatase 108 38 - 126 U/L   Total Bilirubin 1.4 (H) 0.3 - 1.2 mg/dL   GFR calc non Af Amer >60 >60 mL/min   GFR calc Af Amer >60 >60 mL/min   Anion gap 16 (H) 5 - 15    Comment: Performed at [facility], [address], [place], [place] [address]  D-dimer, quantitative     Status: Abnormal   Collection Time: 06/26/18 12:24 PM  Result Value Ref Range   D-Dimer, Quant 3.12 (H) 0.00 - 0.50 ug/mL-FEU    Comment: (NOTE) At the manufacturer cut-off of 0.50 ug/mL FEU, this assay has been documented to exclude PE with a sensitivity and negative predictive value of 97 to 99%.  At this time, this assay has not been approved by the FDA to exclude DVT/VTE. Results should be correlated with clinical presentation. Performed at [facility], [address], [place], [place] [address]   Procalcitonin     Status: None   Collection Time: 06/26/18 12:24 PM  Result Value Ref Range   Procalcitonin <0.10 ng/mL    Comment:        Interpretation: PCT (Procalcitonin) <= 0.5 ng/mL: Systemic infection (sepsis) is not likely. Local bacterial infection is possible. (NOTE)       Sepsis PCT Algorithm           Lower Respiratory Tract                                       Infection PCT Algorithm    ----------------------------     ----------------------------         PCT < 0.25 ng/mL                PCT < 0.10 ng/mL         Strongly encourage             Strongly discourage   discontinuation of antibiotics    initiation of antibiotics    ----------------------------     -----------------------------       PCT 0.25 - 0.50 ng/mL            PCT 0.10 - 0.25 ng/mL               OR       >80% decrease in PCT            Discourage initiation of                                            antibiotics      Encourage discontinuation           of antibiotics    ----------------------------     -----------------------------         PCT >= 0.50 ng/mL              PCT 0.26 - 0.50 ng/mL               AND        <80% decrease in PCT             Encourage initiation of  antibiotics       Encourage continuation           of antibiotics    ----------------------------     -----------------------------        PCT >= 0.50 ng/mL                  PCT > 0.50 ng/mL               AND         increase in PCT                  Strongly encourage                                      initiation of antibiotics    Strongly encourage escalation           of antibiotics                                     -----------------------------                                           PCT <= 0.25 ng/mL                                                 OR                                        > 80% decrease in PCT                                     Discontinue / Do not initiate                                             antibiotics Performed at [facility], [address] [place], [place] [address]   Lactate dehydrogenase     Status: Abnormal   Collection Time: 06/26/18 12:24 PM  Result Value Ref Range   LDH 307 (H) 98 - 192 U/L    Comment: Performed at [facility], [address], [place], [place] [address]   Fibrinogen     Status: None   Collection Time: 06/26/18 12:24 PM  Result Value Ref Range   Fibrinogen 377 210 - 475 mg/dL    Comment: Performed at [facility], [address] [place], [place] [address]  Ferritin     Status: None   Collection Time: 06/26/18 12:25 PM  Result Value Ref Range   Ferritin 67 11 - 307 ng/mL    Comment: Performed at [facility], [address] [place], [name] [address]  Triglycerides     Status: None   Collection Time: 06/26/18 12:25 PM  Result Value Ref Range   Triglycerides 56 <150 mg/dL  Comment: Performed at [facility], [address] [place], [place] [address]  C-reactive protein     Status: None   Collection Time: 06/26/18 12:25 PM  Result Value Ref Range   CRP <0.8 <1.0 mg/dL    Comment: Performed at [facility], [address] [place], [place] [address]  Strep pneumoniae urinary antigen     Status: None   Collection Time: 06/26/18  4:41 PM  Result Value Ref Range   Strep Pneumo Urinary Antigen NEGATIVE NEGATIVE    Comment:        Infection due to S. pneumoniae cannot be absolutely ruled out since the antigen present may be below the detection limit of the test. Performed at [facility], [address], [place], [place] [address]   Lactic acid, plasma     Status: None   Collection Time: 06/26/18  6:03 PM  Result Value Ref Range   Lactic Acid, Venous 1.0 0.5 - 1.9 mmol/L    Comment: Performed at [facility], [address], [place], [place] [address]  Basic metabolic panel     Status: Abnormal   Collection Time: 06/27/18  5:18 AM  Result Value Ref Range   Sodium 132 (L) 135 - 145 mmol/L   Potassium 3.5 3.5 - 5.1 mmol/L   Chloride 97 (L) 98 - 111 mmol/L   CO2 26 22 - 32 mmol/L   Glucose, Bld 115 (H) 70 - 99 mg/dL   BUN 22 8 - 23 mg/dL   Creatinine, Ser 0.57 0.44 - 1.00 mg/dL   Calcium 6.6 (L) 8.9 - 10.3 mg/dL   GFR calc non Af Amer >60 >60 mL/min   GFR calc Af Amer >60 >60 mL/min   Anion gap 9 5 - 15    Comment:  Performed at [facility], [address] [place], [place] [address]  CBC     Status: Abnormal   Collection Time: 06/27/18  5:18 AM  Result Value Ref Range   WBC 18.5 (H) 4.0 - 10.5 K/uL   RBC 3.77 (L) 3.87 - 5.11 MIL/uL   Hemoglobin 11.4 (L) 12.0 - 15.0 g/dL   HCT 34.8 (L) 36.0 - 46.0 %   MCV 92.3 80.0 - 100.0 fL   MCH 30.2 26.0 - 34.0 pg   MCHC 32.8 30.0 - 36.0 g/dL   RDW 15.1 11.5 - 15.5 %   Platelets 476 (H) 150 - 400 K/uL   nRBC 0.0 0.0 - 0.2 %    Comment: Performed at [facility], [address] [place], [place] [address]  Iron and TIBC     Status: None   Collection Time: 06/27/18  5:18 AM  Result Value Ref Range   Iron 83 28 - 170 ug/dL   TIBC 352 250 - 450 ug/dL   Saturation Ratios 24 10.4 - 31.8 %   UIBC 269 ug/dL    Comment: Performed at [facility], [address] [place], [place] [address]  Ferritin     Status: None   Collection Time: 06/27/18  5:18 AM  Result Value Ref Range   Ferritin 61 11 - 307 ng/mL    Comment: Performed at [facility], [address] [place],  [address]    ABGS No results for input(s): PHART, PO2ART, TCO2, HCO3 in the last 72 hours.  Invalid input(s): PCO2 CULTURES Recent Results (from the past 240 hour(s))  Blood Culture (routine x 2)     Status: None (Preliminary result)   Collection Time: 06/26/18 12:23  PM  Result Value Ref Range Status   Specimen Description LEFT ANTECUBITAL  Final   Special Requests   Final    Blood Culture results may not be optimal due to an excessive volume of blood received in culture bottles   Culture   Final    NO GROWTH 2 DAYS Performed at [facility], [address], [place], [place] [address]    Report Status PENDING  Incomplete  Blood Culture (routine x 2)     Status: None (Preliminary result)   Collection Time: 06/26/18 12:23 PM  Result Value Ref Range Status   Specimen Description RIGHT ANTECUBITAL  Final   Special Requests   Final    Blood Culture results may not be optimal due to an  excessive volume of blood received in culture bottles   Culture   Final    NO GROWTH 2 DAYS Performed at [facility], [address], [place],  [address]    Report Status PENDING  Incomplete  SARS Coronavirus 2 [facility] order, Performed in [place] hospital lab)     Status: None   Collection Time: 06/26/18 12:24 PM  Result Value Ref Range Status   SARS Coronavirus 2 NEGATIVE NEGATIVE Final    Comment: (NOTE) If result is NEGATIVE SARS-CoV-2 target nucleic acids are NOT DETECTED. The SARS-CoV-2 RNA is generally detectable in upper and lower  respiratory specimens during the acute phase of infection. The lowest  concentration of SARS-CoV-2 viral copies this assay can detect is 250  copies / mL. A negative result does not preclude SARS-CoV-2 infection  and should not be used as the sole basis for treatment or other  patient management decisions.  A negative result may occur with  improper specimen collection / handling, submission of specimen other  than nasopharyngeal swab, presence of viral mutation(s) within the  areas targeted by this assay, and inadequate number of viral copies  (<250 copies / mL). A negative result must be combined with clinical  observations, patient history, and epidemiological information. If result is POSITIVE SARS-CoV-2 target nucleic acids are DETECTED. The SARS-CoV-2 RNA is generally detectable in upper and lower  respiratory specimens dur ing the acute phase of infection.  Positive  results are indicative of active infection with SARS-CoV-2.  Clinical  correlation with patient history and other diagnostic information is  necessary to determine patient infection status.  Positive results do  not rule out bacterial infection or co-infection with other viruses. If result is PRESUMPTIVE POSTIVE SARS-CoV-2 nucleic acids MAY BE PRESENT.   A presumptive positive result was obtained on the submitted specimen  and confirmed on repeat testing.  While  2019 novel coronavirus  (SARS-CoV-2) nucleic acids may be present in the submitted sample  additional confirmatory testing may be necessary for epidemiological  and / or clinical management purposes  to differentiate between  SARS-CoV-2 and other Sarbecovirus currently known to infect humans.  If clinically indicated additional testing with an alternate test  methodology [phone]) is advised. The SARS-CoV-2 RNA is generally  detectable in upper and lower respiratory sp ecimens during the acute  phase of infection. The expected result is Negative. Fact Sheet for Patients:  StrictlyIdeas.no Fact Sheet for Healthcare Providers: BankingDealers.co.za This test is not yet approved or cleared by the [place] FDA and has been authorized for detection and/or diagnosis of SARS-CoV-2 by FDA under an Emergency Use Authorization (EUA).  This EUA will remain in effect (meaning this test can be used) for the duration of  the COVID-19 declaration under Section 564(b)(1) of the Act, 21 U.S.C. section 360bbb-3(b)(1), unless the authorization is terminated or revoked sooner. Performed at [facility], [address], [place], [place] [address]    Studies/Results: Dg Pelvis 1-2 Views  Result Date: 06/26/2018 CLINICAL DATA:  Right groin pain secondary to a fall 5 days ago. Initial encounter. EXAM: PELVIS - 1-2 VIEW COMPARISON:  CT abdomen and pelvis 01/01/2018. FINDINGS: The patient has a nondisplaced fracture of the right inferior pubic ramus. No other acute fracture is identified. Remote healed right hip fracture with fixation hardware in place noted. Mild bilateral hip degenerative change is present. IMPRESSION: Acute nondisplaced right inferior pubic ramus fracture. Remote healed right hip fracture with fixation hardware in place. Electronically Signed   By: Inge Rise M.D.   On: 06/26/2018 13:21   Dg Chest Portable 1 View  Result Date:  06/26/2018 CLINICAL DATA:  [place] yesterday.  Low oxygen saturation. EXAM: PORTABLE CHEST 1 VIEW COMPARISON:  11/18/2016 FINDINGS: Heart size is normal. Chronic atherosclerosis of the aorta. Pulmonary infiltrates most pronounced in the right upper lobe and left lower lobe. Some possibility there could be mild pulmonary edema, but pneumonia is favored. No visible effusion. Previous left shoulder replacement. Old rib fracture on the left. IMPRESSION: Bilateral infiltrates, most pronounced in the right upper lobe and left lower lobe, most consistent with pneumonia. Electronically Signed   By: Nelson Chimes M.D.   On: 06/26/2018 10:59    Medications:  Prior to Admission:  Medications Prior to Admission  Medication Sig Dispense Refill Last Dose  . Ascorbic Acid (VITAMIN C) 1000 MG tablet Take 1,000 mg by mouth 2 (two) times daily.    06/25/2018 at Unknown time  . aspirin EC 81 MG tablet Take 81 mg by mouth daily.   06/25/2018 at Unknown time  . Calcium Carb-Cholecalciferol (CALCIUM 600 + D PO) Take 1-2 tablets by mouth See admin instructions. 2 tabs in the morning, and 1 tab at [facility]   06/25/2018 at Unknown time  . diltiazem (CARDIZEM CD) 240 MG 24 hr capsule TAKE 1 CAPSULE(240 MG) BY MOUTH DAILY 60 capsule 0 06/25/2018 at Unknown time  . diphenhydrAMINE (BENADRYL) 25 mg capsule Take 25 mg by mouth at bedtime.   06/25/2018 at Unknown time  . DULoxetine (CYMBALTA) 30 MG capsule Take 30 mg by mouth daily.    06/25/2018 at Unknown time  . Flaxseed, Linseed, (FLAXSEED OIL) 1000 MG CAPS Take 1 capsule by mouth 2 (two) times daily.   06/25/2018 at Unknown time  . Multiple Vitamin (MULTIVITAMIN WITH MINERALS) TABS Take 1 tablet by mouth daily.   06/25/2018 at Unknown time  . niacin 500 MG CR capsule Take 500 mg by mouth at bedtime.   06/25/2018 at Unknown time  . Omega-3 1000 MG CAPS Take 1 capsule by mouth daily.   06/25/2018 at Unknown time  . pantoprazole (PROTONIX) 40 MG tablet Take 1 tablet (40 mg total) by mouth daily. 30 tablet  11 06/25/2018 at Unknown time   Scheduled: . aspirin EC  81 mg Oral Daily  . diltiazem  240 mg Oral Daily  . DULoxetine  30 mg Oral Daily  . enoxaparin (LOVENOX) injection  40 mg Subcutaneous Q24H  . feeding supplement (GLUCERNA SHAKE)  237 mL Oral BID BM  . guaiFENesin  1,200 mg Oral BID  . multivitamin with minerals  1 tablet Oral Daily  . niacin  500 mg Oral QHS  . omega-3 acid ethyl esters  1 capsule  Oral Daily  . pantoprazole  40 mg Oral Daily  . vitamin C  1,000 mg Oral BID   Continuous: . sodium chloride 10 mL/hr at 06/28/18 0300  . levofloxacin (LEVAQUIN) IV Stopped (06/27/18 1831)   OXB:DZHGDJ chloride, acetaminophen **OR** acetaminophen, loperamide, ondansetron **OR** ondansetron (ZOFRAN) IV, traMADol  Assesment: She was admitted with sepsis presumably secondary to pneumonia.  She has resolved sepsis pathophysiology.  She has severe COPD at baseline with ongoing cigarette smoking.  In addition she has a closed fracture of the pelvis.  It has been recommended that she go to skilled care facility. Active Problems:   Tobacco use disorder   Sepsis due to undetermined organism ([place])   Acute respiratory failure with hypoxia (HCC)   Lobar pneumonia (HCC)   Closed fracture of multiple pubic rami, right, with delayed healing, subsequent encounter    Plan: Continue antibiotics.  Continue other treatments.  Continue PT.  She is not ready for discharge quite yet    LOS: 2 days   Alonza Bogus 06/28/2018, 9:53 AM

## 2018-06-28 NOTE — Progress Notes (Signed)
Physical Therapy Treatment Patient Details Name: [name] MRN: [id number] DOB: January 25, 1936 Today's Date: 06/28/2018    History of Present Illness [name] is a 83 y.o. female with medical history of COPD, hypertension, celiac stenosis, depression, GERD, hypothyroidism, stroke presenting with right groin pain that began on 06/21/2018.  Patient states that she was bringing her groceries and when she bent over to pull a weed and slipped backwards.  She felt like she was able to catch her self before actually having an actual fall.  However after she woke up from a nap on the afternoon of 06/21/2018, the patient complained of right groin pain resulting in difficulty lifting her right leg.  Over the weekend, the patient complained of increasing generalized weakness without any fevers, chills, nausea, vomiting, diarrhea.  The patient states that she has had coughing and shortness of breath for the better part of a week.  She stated that she called her PCP, Dr. Luan Pulling.  She was placed on azithromycin and prednisone which she states she began taking on 06/17/2018.  She finished azithromycin on 06/21/2018.  Today was supposed to be her last day of prednisone.  She stated that her shortness of breath is somewhat improved, although she continued to have a nonproductive cough without hemoptysis.  She denied any close contacts or recent travels.  Because of increasing pain in her right groin, EMS was activated.  The patient was noted to have oxygen saturation of 82% on room air.  She was placed on supplemental oxygen at 2 L with improvement of her saturation 95%.    PT Comments    Pt friendly and willing to participate with therapy though is limited by pain in groin.  Pt able to donn socks with Lt foot with no assistance, required therapist assistance to donn sock on Rt due to pain with movement.  Pt independent with bed mobility, just increased time to complete.  O2 saturation at 96% with 1.5L A via nasal  cannula.  Trial without O2 A as pt does not use at home.  Able to stand and ambulate to restroom with O2 saturation at 94%.  Upon return to chair prior gait training, decreased saturation to 86% so returned O2 A with 1L via nasal cannula.  Used RW for stability to assist with balance during gait, able to increase distance with some fatigue.  Upon return to room O2 saturation stayed at 97% saturation.  EOS pt left in chair with call bell within reach and chair alarm set.  No reports of increased pain through session.  Pt stood up to get more comfortable in chair and noted part of IV fell on chair, RN aware.   Follow Up Recommendations  SNF;Supervision for mobility/OOB;Supervision - Intermittent     Equipment Recommendations  Rolling walker with 5" wheels    Recommendations for Other Services       Precautions / Restrictions Precautions Precautions: Fall Restrictions Weight Bearing Restrictions: Yes RLE Weight Bearing: Weight bearing as tolerated    Mobility  Bed Mobility Overal bed mobility: Modified Independent Bed Mobility: Supine to Sit     Supine to sit: Supervision     General bed mobility comments: slow labored movement.  Pt able to donn socks on Lt foot, required assistance with Rt foot due to pain with mobility to reach foot for donning sock  Transfers Overall transfer level: Modified independent Equipment used: Rolling walker (2 wheeled) Transfers: Sit to/from Stand Sit to Stand: Min guard  General transfer comment: increased time, labored movement.  Cueing for handplacement to assist with sit to stand  Ambulation/Gait Ambulation/Gait assistance: Min assist Gait Distance (Feet): 86 Feet Assistive device: Rolling walker (2 wheeled) Gait Pattern/deviations: Decreased step length - right;Decreased step length - left;Decreased stance time - left;Step-to pattern Gait velocity: decreased    General Gait Details: slow labored cadence, no LOB.  Did use RW for  balance and stability   Stairs             Wheelchair Mobility    Modified Rankin (Stroke Patients Only)       Balance                                            Cognition Arousal/Alertness: Awake/alert Behavior During Therapy: WFL for tasks assessed/performed Overall Cognitive Status: Within Functional Limits for tasks assessed                                        Exercises      General Comments        Pertinent Vitals/Pain Pain Assessment: 0-10 Pain Score: 7  Pain Location: Right groin/pelvis Pain Descriptors / Indicators: Aching;Discomfort Pain Intervention(s): Monitored during session;Limited activity within patient's tolerance    Home Living                      Prior Function            PT Goals (current goals can now be found in the care plan section)      Frequency    Min 3X/week      PT Plan Current plan remains appropriate    Co-evaluation              AM-PAC PT "6 Clicks" Mobility   Outcome Measure  Help needed turning from your back to your side while in a flat bed without using bedrails?: None Help needed moving from lying on your back to sitting on the side of a flat bed without using bedrails?: None Help needed moving to and from a bed to a chair (including a wheelchair)?: A Little Help needed standing up from a chair using your arms (e.g., wheelchair or bedside chair)?: A Little Help needed to walk in hospital room?: A Little Help needed climbing 3-5 steps with a railing? : A Lot 6 Click Score: 19    End of Session Equipment Utilized During Treatment: Gait belt Activity Tolerance: Patient tolerated treatment well;Patient limited by fatigue Patient left: in chair;with call bell/phone within reach;with chair alarm set Nurse Communication: Mobility status PT Visit Diagnosis: Unsteadiness on feet (R26.81);Other abnormalities of gait and mobility (R26.89);Muscle weakness  (generalized) (M62.81)     Time: 2458-0998 PT Time Calculation (min) (ACUTE ONLY): 40 min  Charges:  $Therapeutic Activity: 38-52 mins                     [address] LPTA; CBIS [phone]   Aldona Lento 06/28/2018, 3:01 PM

## 2018-06-28 NOTE — Care Management Important Message (Signed)
Important Message  Patient Details  Name: [name] MRN: [id number] Date of Birth: [birth year]   Medicare Important Message Given:  Yes    Tommy Medal 06/28/2018, 1:43 PM

## 2018-06-29 MED ORDER — DIPHENHYDRAMINE HCL 25 MG PO CAPS
25.0000 mg | ORAL_CAPSULE | Freq: Every day | ORAL | Status: DC | PRN
  Administered 2018-06-29 – 2018-07-01 (×3): 25 mg via ORAL
  Filled 2018-06-29 (×3): qty 1

## 2018-06-29 NOTE — Progress Notes (Signed)
Subjective: She says she feels better.  She has no new complaints.  She still has pain in her pelvic area.  Her breathing is getting better.  Objective: Vital signs in last 24 hours: Temp:  [97.8 F (36.6 C)-98.2 F (36.8 C)] 98.1 F (36.7 C) (05/09 0511) Pulse Rate:  [66-76] 74 (05/09 0511) Resp:  [16-17] 16 (05/09 0511) BP: (130-149)/(49-50) 149/49 (05/09 0511) SpO2:  [88 %-96 %] 96 % (05/09 0511) Weight change:  Last BM Date: 06/27/18  Intake/Output from previous day: 05/08 0701 - 05/09 0700 In: 480 [P.O.:480] Out: -   PHYSICAL EXAM General appearance: alert, cooperative and mild distress Resp: rhonchi bilaterally Cardio: regular rate and rhythm, S1, S2 normal, no murmur, click, rub or gallop GI: soft, non-tender; bowel sounds normal; no masses,  no organomegaly Extremities: extremities normal, atraumatic, no cyanosis or edema  Lab Results:  No results found for this or any previous visit (from the past 48 hour(s)).  ABGS No results for input(s): PHART, PO2ART, TCO2, HCO3 in the last 72 hours.  Invalid input(s): PCO2 CULTURES Recent Results (from the past 240 hour(s))  Blood Culture (routine x 2)     Status: None (Preliminary result)   Collection Time: 06/26/18 12:23 PM  Result Value Ref Range Status   Specimen Description LEFT ANTECUBITAL  Final   Special Requests   Final    Blood Culture results may not be optimal due to an excessive volume of blood received in culture bottles   Culture   Final    NO GROWTH 3 DAYS Performed at [facility], [address] [place], [place] [address]    Report Status PENDING  Incomplete  Blood Culture (routine x 2)     Status: None (Preliminary result)   Collection Time: 06/26/18 12:23 PM  Result Value Ref Range Status   Specimen Description RIGHT ANTECUBITAL  Final   Special Requests   Final    Blood Culture results may not be optimal due to an excessive volume of blood received in culture bottles   Culture   Final    NO  GROWTH 3 DAYS Performed at [facility], [address], [place], [place] [address]    Report Status PENDING  Incomplete  SARS Coronavirus 2 [facility] order, Performed in [place] hospital lab)     Status: None   Collection Time: 06/26/18 12:24 PM  Result Value Ref Range Status   SARS Coronavirus 2 NEGATIVE NEGATIVE Final    Comment: (NOTE) If result is NEGATIVE SARS-CoV-2 target nucleic acids are NOT DETECTED. The SARS-CoV-2 RNA is generally detectable in upper and lower  respiratory specimens during the acute phase of infection. The lowest  concentration of SARS-CoV-2 viral copies this assay can detect is 250  copies / mL. A negative result does not preclude SARS-CoV-2 infection  and should not be used as the sole basis for treatment or other  patient management decisions.  A negative result may occur with  improper specimen collection / handling, submission of specimen other  than nasopharyngeal swab, presence of viral mutation(s) within the  areas targeted by this assay, and inadequate number of viral copies  (<250 copies / mL). A negative result must be combined with clinical  observations, patient history, and epidemiological information. If result is POSITIVE SARS-CoV-2 target nucleic acids are DETECTED. The SARS-CoV-2 RNA is generally detectable in upper and lower  respiratory specimens dur ing the acute phase of infection.  Positive  results are indicative of active infection with SARS-CoV-2.  Clinical  correlation with patient history and other diagnostic information is  necessary to determine patient infection status.  Positive results do  not rule out bacterial infection or co-infection with other viruses. If result is PRESUMPTIVE POSTIVE SARS-CoV-2 nucleic acids MAY BE PRESENT.   A presumptive positive result was obtained on the submitted specimen  and confirmed on repeat testing.  While 2019 novel coronavirus  (SARS-CoV-2) nucleic acids may be present in the  submitted sample  additional confirmatory testing may be necessary for epidemiological  and / or clinical management purposes  to differentiate between  SARS-CoV-2 and other Sarbecovirus currently known to infect humans.  If clinically indicated additional testing with an alternate test  methodology [phone]) is advised. The SARS-CoV-2 RNA is generally  detectable in upper and lower respiratory sp ecimens during the acute  phase of infection. The expected result is Negative. Fact Sheet for Patients:  StrictlyIdeas.no Fact Sheet for Healthcare Providers: BankingDealers.co.za This test is not yet approved or cleared by the [place] FDA and has been authorized for detection and/or diagnosis of SARS-CoV-2 by FDA under an Emergency Use Authorization (EUA).  This EUA will remain in effect (meaning this test can be used) for the duration of the COVID-19 declaration under Section 564(b)(1) of the Act, 21 U.S.C. section 360bbb-3(b)(1), unless the authorization is terminated or revoked sooner. Performed at [facility], [address] [place], [place] [address]    Studies/Results: No results found.  Medications:  Prior to Admission:  Medications Prior to Admission  Medication Sig Dispense Refill Last Dose  . Ascorbic Acid (VITAMIN C) 1000 MG tablet Take 1,000 mg by mouth 2 (two) times daily.    06/25/2018 at Unknown time  . aspirin EC 81 MG tablet Take 81 mg by mouth daily.   06/25/2018 at Unknown time  . Calcium Carb-Cholecalciferol (CALCIUM 600 + D PO) Take 1-2 tablets by mouth See admin instructions. 2 tabs in the morning, and 1 tab at [facility]   06/25/2018 at Unknown time  . diltiazem (CARDIZEM CD) 240 MG 24 hr capsule TAKE 1 CAPSULE(240 MG) BY MOUTH DAILY 60 capsule 0 06/25/2018 at Unknown time  . diphenhydrAMINE (BENADRYL) 25 mg capsule Take 25 mg by mouth at bedtime.   06/25/2018 at Unknown time  . DULoxetine (CYMBALTA) 30 MG capsule Take 30 mg by  mouth daily.    06/25/2018 at Unknown time  . Flaxseed, Linseed, (FLAXSEED OIL) 1000 MG CAPS Take 1 capsule by mouth 2 (two) times daily.   06/25/2018 at Unknown time  . Multiple Vitamin (MULTIVITAMIN WITH MINERALS) TABS Take 1 tablet by mouth daily.   06/25/2018 at Unknown time  . niacin 500 MG CR capsule Take 500 mg by mouth at bedtime.   06/25/2018 at Unknown time  . Omega-3 1000 MG CAPS Take 1 capsule by mouth daily.   06/25/2018 at Unknown time  . pantoprazole (PROTONIX) 40 MG tablet Take 1 tablet (40 mg total) by mouth daily. 30 tablet 11 06/25/2018 at Unknown time   Scheduled: . aspirin EC  81 mg Oral Daily  . diltiazem  240 mg Oral Daily  . DULoxetine  30 mg Oral Daily  . enoxaparin (LOVENOX) injection  40 mg Subcutaneous Q24H  . feeding supplement (GLUCERNA SHAKE)  237 mL Oral BID BM  . guaiFENesin  1,200 mg Oral BID  . multivitamin with minerals  1 tablet Oral Daily  . niacin  500 mg Oral QHS  . omega-3 acid ethyl esters  1 capsule Oral Daily  .  pantoprazole  40 mg Oral Daily  . vitamin C  1,000 mg Oral BID   Continuous: . sodium chloride 10 mL/hr at 06/28/18 0300  . levofloxacin (LEVAQUIN) IV 500 mg (06/28/18 1710)   GYK:ZLDJTT chloride, acetaminophen **OR** acetaminophen, diphenhydrAMINE, loperamide, ondansetron **OR** ondansetron (ZOFRAN) IV, traMADol  Assesment: She was admitted with pneumonia.  She was septic initially but has resolved sepsis pathophysiology.  She has acute hypoxic respiratory failure and she is still on oxygen.  Her pneumonia seems to be improving slowly.  At baseline she has severe COPD with ongoing tobacco abuse  She has mesenteric ischemia and that seems to be doing a little bit better.  She has a right pubic fracture which is improved and she is going to need rehab. Active Problems:   Tobacco use disorder   Sepsis due to undetermined organism ([place])   Acute respiratory failure with hypoxia (HCC)   Lobar pneumonia (HCC)   Closed fracture of multiple pubic  rami, right, with delayed healing, subsequent encounter    Plan: I think she needs 1 more day of inpatient treatment for her pneumonia with plans to transfer to skilled care facility tomorrow    LOS: 3 days   Alonza Bogus 06/29/2018, 9:31 AM

## 2018-06-30 ENCOUNTER — Inpatient Hospital Stay (HOSPITAL_COMMUNITY): Payer: PPO

## 2018-06-30 MED ORDER — ACETYLCYSTEINE 20 % IN SOLN
3.0000 mL | Freq: Four times a day (QID) | RESPIRATORY_TRACT | Status: DC
  Administered 2018-07-01: 3 mL via RESPIRATORY_TRACT
  Filled 2018-06-30: qty 4

## 2018-06-30 MED ORDER — ACETYLCYSTEINE 20 % IN SOLN
3.0000 mL | Freq: Four times a day (QID) | RESPIRATORY_TRACT | Status: DC
  Administered 2018-06-30: 4 mL via RESPIRATORY_TRACT
  Administered 2018-06-30: 3 mL via RESPIRATORY_TRACT
  Filled 2018-06-30 (×2): qty 4

## 2018-06-30 MED ORDER — LEVALBUTEROL HCL 0.63 MG/3ML IN NEBU
0.6300 mg | INHALATION_SOLUTION | Freq: Four times a day (QID) | RESPIRATORY_TRACT | Status: DC
  Administered 2018-06-30 – 2018-07-01 (×5): 0.63 mg via RESPIRATORY_TRACT
  Filled 2018-06-30 (×6): qty 3

## 2018-06-30 NOTE — Progress Notes (Signed)
Subjective: She feels more congested today.  She does not look as comfortable.  She is coughing mostly nonproductively.  Objective: Vital signs in last 24 hours: Temp:  [97.9 F (36.6 C)-98.5 F (36.9 C)] 98.5 F (36.9 C) (05/10 0534) Pulse Rate:  [55-78] 55 (05/10 0534) Resp:  [16] 16 (05/10 0534) BP: (154-161)/(53-60) 154/53 (05/10 0534) SpO2:  [94 %-96 %] 96 % (05/10 0534) Weight change:  Last BM Date: 06/27/18  Intake/Output from previous day: 05/09 0701 - 05/10 0700 In: 1200 [P.O.:1200] Out: -   PHYSICAL EXAM General appearance: alert, cooperative and no distress Resp: rhonchi bilaterally Cardio: regular rate and rhythm, S1, S2 normal, no murmur, click, rub or gallop GI: soft, non-tender; bowel sounds normal; no masses,  no organomegaly Extremities: extremities normal, atraumatic, no cyanosis or edema  Lab Results:  No results found for this or any previous visit (from the past 48 hour(s)).  ABGS No results for input(s): PHART, PO2ART, TCO2, HCO3 in the last 72 hours.  Invalid input(s): PCO2 CULTURES Recent Results (from the past 240 hour(s))  Blood Culture (routine x 2)     Status: None (Preliminary result)   Collection Time: 06/26/18 12:23 PM  Result Value Ref Range Status   Specimen Description LEFT ANTECUBITAL  Final   Special Requests   Final    Blood Culture results may not be optimal due to an excessive volume of blood received in culture bottles   Culture   Final    NO GROWTH 4 DAYS Performed at [facility], [address], [place], [place] [address]    Report Status PENDING  Incomplete  Blood Culture (routine x 2)     Status: None (Preliminary result)   Collection Time: 06/26/18 12:23 PM  Result Value Ref Range Status   Specimen Description RIGHT ANTECUBITAL  Final   Special Requests   Final    Blood Culture results may not be optimal due to an excessive volume of blood received in culture bottles   Culture   Final    NO GROWTH 4 DAYS Performed  at [facility], [address], [place], [place] [address]    Report Status PENDING  Incomplete  SARS Coronavirus 2 [facility] order, Performed in [place] hospital lab)     Status: None   Collection Time: 06/26/18 12:24 PM  Result Value Ref Range Status   SARS Coronavirus 2 NEGATIVE NEGATIVE Final    Comment: (NOTE) If result is NEGATIVE SARS-CoV-2 target nucleic acids are NOT DETECTED. The SARS-CoV-2 RNA is generally detectable in upper and lower  respiratory specimens during the acute phase of infection. The lowest  concentration of SARS-CoV-2 viral copies this assay can detect is 250  copies / mL. A negative result does not preclude SARS-CoV-2 infection  and should not be used as the sole basis for treatment or other  patient management decisions.  A negative result may occur with  improper specimen collection / handling, submission of specimen other  than nasopharyngeal swab, presence of viral mutation(s) within the  areas targeted by this assay, and inadequate number of viral copies  (<250 copies / mL). A negative result must be combined with clinical  observations, patient history, and epidemiological information. If result is POSITIVE SARS-CoV-2 target nucleic acids are DETECTED. The SARS-CoV-2 RNA is generally detectable in upper and lower  respiratory specimens dur ing the acute phase of infection.  Positive  results are indicative of active infection with SARS-CoV-2.  Clinical  correlation with patient history and  other diagnostic information is  necessary to determine patient infection status.  Positive results do  not rule out bacterial infection or co-infection with other viruses. If result is PRESUMPTIVE POSTIVE SARS-CoV-2 nucleic acids MAY BE PRESENT.   A presumptive positive result was obtained on the submitted specimen  and confirmed on repeat testing.  While 2019 novel coronavirus  (SARS-CoV-2) nucleic acids may be present in the submitted sample  additional  confirmatory testing may be necessary for epidemiological  and / or clinical management purposes  to differentiate between  SARS-CoV-2 and other Sarbecovirus currently known to infect humans.  If clinically indicated additional testing with an alternate test  methodology [phone]) is advised. The SARS-CoV-2 RNA is generally  detectable in upper and lower respiratory sp ecimens during the acute  phase of infection. The expected result is Negative. Fact Sheet for Patients:  StrictlyIdeas.no Fact Sheet for Healthcare Providers: BankingDealers.co.za This test is not yet approved or cleared by the [place] FDA and has been authorized for detection and/or diagnosis of SARS-CoV-2 by FDA under an Emergency Use Authorization (EUA).  This EUA will remain in effect (meaning this test can be used) for the duration of the COVID-19 declaration under Section 564(b)(1) of the Act, 21 U.S.C. section 360bbb-3(b)(1), unless the authorization is terminated or revoked sooner. Performed at [facility], [address] [place], [place] [address]    Studies/Results: No results found.  Medications:  Prior to Admission:  Medications Prior to Admission  Medication Sig Dispense Refill Last Dose  . Ascorbic Acid (VITAMIN C) 1000 MG tablet Take 1,000 mg by mouth 2 (two) times daily.    06/25/2018 at Unknown time  . aspirin EC 81 MG tablet Take 81 mg by mouth daily.   06/25/2018 at Unknown time  . Calcium Carb-Cholecalciferol (CALCIUM 600 + D PO) Take 1-2 tablets by mouth See admin instructions. 2 tabs in the morning, and 1 tab at [facility]   06/25/2018 at Unknown time  . diltiazem (CARDIZEM CD) 240 MG 24 hr capsule TAKE 1 CAPSULE(240 MG) BY MOUTH DAILY 60 capsule 0 06/25/2018 at Unknown time  . diphenhydrAMINE (BENADRYL) 25 mg capsule Take 25 mg by mouth at bedtime.   06/25/2018 at Unknown time  . DULoxetine (CYMBALTA) 30 MG capsule Take 30 mg by mouth daily.    06/25/2018 at  Unknown time  . Flaxseed, Linseed, (FLAXSEED OIL) 1000 MG CAPS Take 1 capsule by mouth 2 (two) times daily.   06/25/2018 at Unknown time  . Multiple Vitamin (MULTIVITAMIN WITH MINERALS) TABS Take 1 tablet by mouth daily.   06/25/2018 at Unknown time  . niacin 500 MG CR capsule Take 500 mg by mouth at bedtime.   06/25/2018 at Unknown time  . Omega-3 1000 MG CAPS Take 1 capsule by mouth daily.   06/25/2018 at Unknown time  . pantoprazole (PROTONIX) 40 MG tablet Take 1 tablet (40 mg total) by mouth daily. 30 tablet 11 06/25/2018 at Unknown time   Scheduled: . acetylcysteine  3 mL Nebulization Q6H  . aspirin EC  81 mg Oral Daily  . diltiazem  240 mg Oral Daily  . DULoxetine  30 mg Oral Daily  . enoxaparin (LOVENOX) injection  40 mg Subcutaneous Q24H  . feeding supplement (GLUCERNA SHAKE)  237 mL Oral BID BM  . guaiFENesin  1,200 mg Oral BID  . levalbuterol  0.63 mg Nebulization Q6H  . multivitamin with minerals  1 tablet Oral Daily  . niacin  500 mg Oral QHS  .  omega-3 acid ethyl esters  1 capsule Oral Daily  . pantoprazole  40 mg Oral Daily  . vitamin C  1,000 mg Oral BID   Continuous: . sodium chloride 10 mL/hr at 06/28/18 0300  . levofloxacin (LEVAQUIN) IV 500 mg (06/29/18 1709)   HQR:FXJOIT chloride, acetaminophen **OR** acetaminophen, diphenhydrAMINE, loperamide, ondansetron **OR** ondansetron (ZOFRAN) IV, traMADol  Assesment: She was admitted with pneumonia.  She had been improving but today she has much more congestion.  She has significant COPD at baseline and has an exacerbation.  She has a pelvic fracture and she needs to go for rehab but considering her chest exam today I do not think she is ready  She has acute hypoxic respiratory failure  She has mesenteric ischemia.  She had been missed labeled as having gluten allergy and I have removed that from her chart Active Problems:   Tobacco use disorder   Sepsis due to undetermined organism ([place])   Acute respiratory failure with  hypoxia ([place])   Lobar pneumonia ([place])   Closed fracture of multiple pubic rami, right, with delayed healing, subsequent encounter    Plan: Chest x-ray today.  Add Mucomyst.  Potential discharge tomorrow.    LOS: 4 days   Alonza Bogus 06/30/2018, 9:40 AM

## 2018-07-01 LAB — CULTURE, BLOOD (ROUTINE X 2)
Culture: NO GROWTH
Culture: NO GROWTH

## 2018-07-01 LAB — LEGIONELLA PNEUMOPHILA SEROGP 1 UR AG: L. pneumophila Serogp 1 Ur Ag: NEGATIVE

## 2018-07-01 MED ORDER — LEVOFLOXACIN 500 MG PO TABS
500.0000 mg | ORAL_TABLET | Freq: Every day | ORAL | Status: DC
  Administered 2018-07-01: 500 mg via ORAL
  Filled 2018-07-01: qty 1

## 2018-07-01 MED ORDER — MAGIC MOUTHWASH
5.0000 mL | Freq: Three times a day (TID) | ORAL | Status: DC
  Administered 2018-07-01 – 2018-07-02 (×5): 5 mL via ORAL
  Filled 2018-07-01 (×5): qty 5

## 2018-07-01 NOTE — Progress Notes (Signed)
Subjective: Her breathing is a little better today.  However she feels like the nebulizer treatments have caused her mouth to break out.  She is coughing up some sputum now.  Still complaining of hip pain.  Objective: Vital signs in last 24 hours: Temp:  [98 F (36.7 C)-98.7 F (37.1 C)] 98.7 F (37.1 C) (05/11 0557) Pulse Rate:  [73-86] 73 (05/11 0557) Resp:  [16-18] 17 (05/11 0557) BP: (161-181)/(59-65) 161/62 (05/11 0557) SpO2:  [92 %-98 %] 94 % (05/11 0754) Weight change:  Last BM Date: 06/30/18  Intake/Output from previous day: 05/10 0701 - 05/11 0700 In: 1200 [P.O.:1200] Out: 400 [Urine:400]  PHYSICAL EXAM General appearance: alert, cooperative and mild distress Resp: rhonchi bilaterally Cardio: regular rate and rhythm, S1, S2 normal, no murmur, click, rub or gallop GI: soft, non-tender; bowel sounds normal; no masses,  no organomegaly Extremities: extremities normal, atraumatic, no cyanosis or edema  Lab Results:  No results found for this or any previous visit (from the past 48 hour(s)).  ABGS No results for input(s): PHART, PO2ART, TCO2, HCO3 in the last 72 hours.  Invalid input(s): PCO2 CULTURES Recent Results (from the past 240 hour(s))  Blood Culture (routine x 2)     Status: None   Collection Time: 06/26/18 12:23 PM  Result Value Ref Range Status   Specimen Description LEFT ANTECUBITAL  Final   Special Requests   Final    Blood Culture results may not be optimal due to an excessive volume of blood received in culture bottles   Culture   Final    NO GROWTH 5 DAYS Performed at [facility], [address] [place], [place] [address]    Report Status 07/01/2018 FINAL  Final  Blood Culture (routine x 2)     Status: None   Collection Time: 06/26/18 12:23 PM  Result Value Ref Range Status   Specimen Description RIGHT ANTECUBITAL  Final   Special Requests   Final    Blood Culture results may not be optimal due to an excessive volume of blood received in  culture bottles   Culture   Final    NO GROWTH 5 DAYS Performed at [facility], [address] [place], [place] [address]    Report Status 07/01/2018 FINAL  Final  SARS Coronavirus 2 [facility] order, Performed in [place] hospital lab)     Status: None   Collection Time: 06/26/18 12:24 PM  Result Value Ref Range Status   SARS Coronavirus 2 NEGATIVE NEGATIVE Final    Comment: (NOTE) If result is NEGATIVE SARS-CoV-2 target nucleic acids are NOT DETECTED. The SARS-CoV-2 RNA is generally detectable in upper and lower  respiratory specimens during the acute phase of infection. The lowest  concentration of SARS-CoV-2 viral copies this assay can detect is 250  copies / mL. A negative result does not preclude SARS-CoV-2 infection  and should not be used as the sole basis for treatment or other  patient management decisions.  A negative result may occur with  improper specimen collection / handling, submission of specimen other  than nasopharyngeal swab, presence of viral mutation(s) within the  areas targeted by this assay, and inadequate number of viral copies  (<250 copies / mL). A negative result must be combined with clinical  observations, patient history, and epidemiological information. If result is POSITIVE SARS-CoV-2 target nucleic acids are DETECTED. The SARS-CoV-2 RNA is generally detectable in upper and lower  respiratory specimens dur ing the acute phase of infection.  Positive  results are indicative of active infection with SARS-CoV-2.  Clinical  correlation with patient history and other diagnostic information is  necessary to determine patient infection status.  Positive results do  not rule out bacterial infection or co-infection with other viruses. If result is PRESUMPTIVE POSTIVE SARS-CoV-2 nucleic acids MAY BE PRESENT.   A presumptive positive result was obtained on the submitted specimen  and confirmed on repeat testing.  While 2019 novel coronavirus   (SARS-CoV-2) nucleic acids may be present in the submitted sample  additional confirmatory testing may be necessary for epidemiological  and / or clinical management purposes  to differentiate between  SARS-CoV-2 and other Sarbecovirus currently known to infect humans.  If clinically indicated additional testing with an alternate test  methodology [phone]) is advised. The SARS-CoV-2 RNA is generally  detectable in upper and lower respiratory sp ecimens during the acute  phase of infection. The expected result is Negative. Fact Sheet for Patients:  StrictlyIdeas.no Fact Sheet for Healthcare Providers: BankingDealers.co.za This test is not yet approved or cleared by the [place] FDA and has been authorized for detection and/or diagnosis of SARS-CoV-2 by FDA under an Emergency Use Authorization (EUA).  This EUA will remain in effect (meaning this test can be used) for the duration of the COVID-19 declaration under Section 564(b)(1) of the Act, 21 U.S.C. section 360bbb-3(b)(1), unless the authorization is terminated or revoked sooner. Performed at [facility], [address] [place], [place] [address]    Studies/Results: Dg Chest Port 1 View  Result Date: 06/30/2018 CLINICAL DATA:  Pneumonia. Nonproductive cough. EXAM: PORTABLE CHEST 1 VIEW COMPARISON:  06/26/2018 FINDINGS: The patient is rotated to the right. The cardiomediastinal silhouette is unchanged with normal heart size. Increasing left basilar opacity suggests worsening airspace disease and pleural fluid. Hazy right upper lobe opacity is stable to slightly less conspicuous. No pneumothorax is identified. Prior left shoulder replacement and cervical spine fusion are noted IMPRESSION: Persistent bilateral airspace disease suggestive of pneumonia with worsening left basilar aeration and evidence of a left pleural effusion. Electronically Signed   By: Logan Bores M.D.   On: 06/30/2018  10:00    Medications:  Prior to Admission:  Medications Prior to Admission  Medication Sig Dispense Refill Last Dose  . Ascorbic Acid (VITAMIN C) 1000 MG tablet Take 1,000 mg by mouth 2 (two) times daily.    06/25/2018 at Unknown time  . aspirin EC 81 MG tablet Take 81 mg by mouth daily.   06/25/2018 at Unknown time  . Calcium Carb-Cholecalciferol (CALCIUM 600 + D PO) Take 1-2 tablets by mouth See admin instructions. 2 tabs in the morning, and 1 tab at [facility]   06/25/2018 at Unknown time  . diltiazem (CARDIZEM CD) 240 MG 24 hr capsule TAKE 1 CAPSULE(240 MG) BY MOUTH DAILY 60 capsule 0 06/25/2018 at Unknown time  . diphenhydrAMINE (BENADRYL) 25 mg capsule Take 25 mg by mouth at bedtime.   06/25/2018 at Unknown time  . DULoxetine (CYMBALTA) 30 MG capsule Take 30 mg by mouth daily.    06/25/2018 at Unknown time  . Flaxseed, Linseed, (FLAXSEED OIL) 1000 MG CAPS Take 1 capsule by mouth 2 (two) times daily.   06/25/2018 at Unknown time  . Multiple Vitamin (MULTIVITAMIN WITH MINERALS) TABS Take 1 tablet by mouth daily.   06/25/2018 at Unknown time  . niacin 500 MG CR capsule Take 500 mg by mouth at bedtime.   06/25/2018 at Unknown time  . Omega-3 1000 MG CAPS Take 1 capsule  by mouth daily.   06/25/2018 at Unknown time  . pantoprazole (PROTONIX) 40 MG tablet Take 1 tablet (40 mg total) by mouth daily. 30 tablet 11 06/25/2018 at Unknown time   Scheduled: . aspirin EC  81 mg Oral Daily  . diltiazem  240 mg Oral Daily  . DULoxetine  30 mg Oral Daily  . enoxaparin (LOVENOX) injection  40 mg Subcutaneous Q24H  . feeding supplement (GLUCERNA SHAKE)  237 mL Oral BID BM  . guaiFENesin  1,200 mg Oral BID  . magic mouthwash  5 mL Oral TID PC & HS  . multivitamin with minerals  1 tablet Oral Daily  . niacin  500 mg Oral QHS  . omega-3 acid ethyl esters  1 capsule Oral Daily  . pantoprazole  40 mg Oral Daily  . vitamin C  1,000 mg Oral BID   Continuous: . sodium chloride 10 mL/hr at 06/28/18 0300  . levofloxacin (LEVAQUIN)  IV 500 mg (06/30/18 1810)   DZH:GDJMEQ chloride, acetaminophen **OR** acetaminophen, diphenhydrAMINE, loperamide, ondansetron **OR** ondansetron (ZOFRAN) IV, traMADol  Assesment: She was admitted with pneumonia and sepsis from that.  She has resolved sepsis pathophysiology.  She was more short of breath yesterday and her chest x-ray showed that her pneumonia has extended.  However she is better today.  She has thrush versus an allergic reaction in her mouth  She has severe COPD at baseline  She has pelvic fracture stable Active Problems:   Tobacco use disorder   Sepsis due to undetermined organism ([place])   Acute respiratory failure with hypoxia (HCC)   Lobar pneumonia (HCC)   Closed fracture of multiple pubic rami, right, with delayed healing, subsequent encounter    Plan: Continue treatments.  Discontinue nebulizer treatments.  Add Magic mouthwash.  I think 1 more day in the hospital before transfer    LOS: 5 days   Alonza Bogus 07/01/2018, 9:07 AM

## 2018-07-01 NOTE — Progress Notes (Signed)
Physical Therapy Treatment Patient Details Name: [name] MRN: [id number] DOB: 09-02-1935 Today's Date: 07/01/2018    History of Present Illness [name] is a 83 y.o. female with medical history of COPD, hypertension, celiac stenosis, depression, GERD, hypothyroidism, stroke presenting with right groin pain that began on 06/21/2018.  Patient states that she was bringing her groceries and when she bent over to pull a weed and slipped backwards.  She felt like she was able to catch her self before actually having an actual fall.  However after she woke up from a nap on the afternoon of 06/21/2018, the patient complained of right groin pain resulting in difficulty lifting her right leg.  Over the weekend, the patient complained of increasing generalized weakness without any fevers, chills, nausea, vomiting, diarrhea.  The patient states that she has had coughing and shortness of breath for the better part of a week.  She stated that she called her PCP, Dr. Luan Pulling.  She was placed on azithromycin and prednisone which she states she began taking on 06/17/2018.  She finished azithromycin on 06/21/2018.  Today was supposed to be her last day of prednisone.  She stated that her shortness of breath is somewhat improved, although she continued to have a nonproductive cough without hemoptysis.  She denied any close contacts or recent travels.  Because of increasing pain in her right groin, EMS was activated.  The patient was noted to have oxygen saturation of 82% on room air.  She was placed on supplemental oxygen at 2 L with improvement of her saturation 95%.    PT Comments    Pt sitting on EOB and willing to participate with therapy today.  Pt with min guard for transfer and gait today.  Able to ambulate 86 ft with RW with no LOB, does continue to ambulate at slow cadence with some reports of pain in groin with weight bearing.  Trial without O2 A with ability to sit and stand with O2 saturation at 94%  room air.  Decrease to 90% with standing longer than a minute so did use O2 A during gait training with O2 saturation WNL.  EOS pt requested to return to bed for comfort.      Follow Up Recommendations  SNF;Supervision for mobility/OOB;Supervision - Intermittent     Equipment Recommendations  Rolling walker with 5" wheels    Recommendations for Other Services       Precautions / Restrictions Precautions Precautions: Fall Restrictions Weight Bearing Restrictions: No RLE Weight Bearing: Weight bearing as tolerated    Mobility  Bed Mobility               General bed mobility comments: pt sitting on EOB upon therapist entrance  Transfers Overall transfer level: Modified independent Equipment used: Rolling walker (2 wheeled) Transfers: Sit to/from Stand Sit to Stand: Min guard         General transfer comment: increased time, labored movement.  Cueing for handplacement to assist with sit to stand  Ambulation/Gait Ambulation/Gait assistance: Min assist Gait Distance (Feet): 86 Feet Assistive device: Rolling walker (2 wheeled) Gait Pattern/deviations: Decreased step length - right;Decreased step length - left;Decreased stance time - left;Step-to pattern Gait velocity: decreased    General Gait Details: slow labored cadence, no LOB.  Did use RW for balance and stability   Stairs             Wheelchair Mobility    Modified Rankin (Stroke Patients Only)  Balance                                            Cognition Arousal/Alertness: Awake/alert Behavior During Therapy: WFL for tasks assessed/performed Overall Cognitive Status: Within Functional Limits for tasks assessed                                        Exercises      General Comments        Pertinent Vitals/Pain Pain Assessment: No/denies pain Pain Score: 6  Pain Location: Right groin/pelvis with weight bearing Pain Descriptors / Indicators:  Aching;Discomfort Pain Intervention(s): Monitored during session;Limited activity within patient's tolerance    Home Living                      Prior Function            PT Goals (current goals can now be found in the care plan section)      Frequency    Min 3X/week      PT Plan Current plan remains appropriate    Co-evaluation              AM-PAC PT "6 Clicks" Mobility   Outcome Measure  Help needed turning from your back to your side while in a flat bed without using bedrails?: None Help needed moving from lying on your back to sitting on the side of a flat bed without using bedrails?: None Help needed moving to and from a bed to a chair (including a wheelchair)?: A Little Help needed standing up from a chair using your arms (e.g., wheelchair or bedside chair)?: A Little Help needed to walk in hospital room?: A Little Help needed climbing 3-5 steps with a railing? : A Lot 6 Click Score: 19    End of Session Equipment Utilized During Treatment: Gait belt Activity Tolerance: Patient tolerated treatment well;Patient limited by fatigue Patient left: in bed;with call bell/phone within reach;with bed alarm set(Pt requested to return to bed) Nurse Communication: Mobility status PT Visit Diagnosis: Unsteadiness on feet (R26.81);Other abnormalities of gait and mobility (R26.89);Muscle weakness (generalized) (M62.81)     Time: 4496-7591 PT Time Calculation (min) (ACUTE ONLY): 28 min  Charges:  $Therapeutic Activity: 23-37 mins                     [address], LPTA; CBIS [phone]   Aldona Lento 07/01/2018, 10:22 AM

## 2018-07-01 NOTE — Care Management Important Message (Signed)
Important Message  Patient Details  Name: [name] MRN: [id number] Date of Birth: 12/27/1935   Medicare Important Message Given:  Yes    Tommy Medal 07/01/2018, 2:45 PM

## 2018-07-02 MED ORDER — TRAMADOL HCL 50 MG PO TABS: 50.0000 mg | ORAL_TABLET | Freq: Four times a day (QID) | ORAL | 1 refills | Status: DC | PRN

## 2018-07-02 MED ORDER — LEVOFLOXACIN 500 MG PO TABS: 500.0000 mg | ORAL_TABLET | Freq: Every day | ORAL | 0 refills | Status: DC

## 2018-07-02 NOTE — Discharge Summary (Signed)
Physician Discharge Summary  Patient ID: BLONDINE HOTTEL MRN: [id number] DOB/AGE: 11-03-1935 83 y.o. Primary Care Physician:Kelina Beauchamp, Percell Miller, MD Admit date: 06/26/2018 Discharge date: 07/02/2018    Discharge Diagnoses:   Active Problems:   Tobacco use disorder   Sepsis due to undetermined organism [facility])   Acute respiratory failure with hypoxia (HCC)   Lobar pneumonia (HCC)   Closed fracture of multiple pubic rami, right, with delayed healing, subsequent encounter COPD Mesenteric ischemia Anxiety Depression  Allergies as of 07/02/2018      Reactions   Aleve [naproxen Sodium] Hives, Palpitations   Hydrocodone Nausea Only   Statins Other (See Comments)   Muscle pain   Celebrex [celecoxib] Nausea And Vomiting   Codeine Nausea And Vomiting   Morphine And Related Nausea And Vomiting      Medication List    TAKE these medications   aspirin EC 81 MG tablet Take 81 mg by mouth daily.   CALCIUM 600 + D PO Take 1-2 tablets by mouth See admin instructions. 2 tabs in the morning, and 1 tab at Noon   diltiazem 240 MG 24 hr capsule Commonly known as:  CARDIZEM CD TAKE 1 CAPSULE(240 MG) BY MOUTH DAILY   diphenhydrAMINE 25 mg capsule Commonly known as:  BENADRYL Take 25 mg by mouth at bedtime.   DULoxetine 30 MG capsule Commonly known as:  CYMBALTA Take 30 mg by mouth daily.   Flaxseed Oil 1000 MG Caps Take 1 capsule by mouth 2 (two) times daily.   levofloxacin 500 MG tablet Commonly known as:  LEVAQUIN Take 1 tablet (500 mg total) by mouth daily at 6 PM.   multivitamin with minerals Tabs tablet Take 1 tablet by mouth daily.   niacin 500 MG CR capsule Take 500 mg by mouth at bedtime.   Omega-3 1000 MG Caps Take 1 capsule by mouth daily.   pantoprazole 40 MG tablet Commonly known as:  PROTONIX Take 1 tablet (40 mg total) by mouth daily.   traMADol 50 MG tablet Commonly known as:  ULTRAM Take 1 tablet (50 mg total) by mouth every 6 (six) hours as needed for  moderate pain.   vitamin C 1000 MG tablet Take 1,000 mg by mouth 2 (two) times daily.            Durable Medical Equipment  (From admission, onward)         Start     Ordered   07/02/18 0903  For home use only DME Walker rolling  Once    Question:  Patient needs a walker to treat with the following condition  Answer:  Weakness   07/02/18 0902          Discharged Condition: Improved    Consults: None  Significant Diagnostic Studies: Dg Pelvis 1-2 Views  Result Date: 06/26/2018 CLINICAL DATA:  Right groin pain secondary to a fall 5 days ago. Initial encounter. EXAM: PELVIS - 1-2 VIEW COMPARISON:  CT abdomen and pelvis 01/01/2018. FINDINGS: The patient has a nondisplaced fracture of the right inferior pubic ramus. No other acute fracture is identified. Remote healed right hip fracture with fixation hardware in place noted. Mild bilateral hip degenerative change is present. IMPRESSION: Acute nondisplaced right inferior pubic ramus fracture. Remote healed right hip fracture with fixation hardware in place. Electronically Signed   By: Inge Rise M.D.   On: 06/26/2018 13:21   Dg Chest Port 1 View  Result Date: 06/30/2018 CLINICAL DATA:  Pneumonia. Nonproductive cough. EXAM: PORTABLE CHEST 1 VIEW  COMPARISON:  06/26/2018 FINDINGS: The patient is rotated to the right. The cardiomediastinal silhouette is unchanged with normal heart size. Increasing left basilar opacity suggests worsening airspace disease and pleural fluid. Hazy right upper lobe opacity is stable to slightly less conspicuous. No pneumothorax is identified. Prior left shoulder replacement and cervical spine fusion are noted IMPRESSION: Persistent bilateral airspace disease suggestive of pneumonia with worsening left basilar aeration and evidence of a left pleural effusion. Electronically Signed   By: Logan Bores M.D.   On: 06/30/2018 10:00   Dg Chest Portable 1 View  Result Date: 06/26/2018 CLINICAL DATA:  [place]  yesterday.  Low oxygen saturation. EXAM: PORTABLE CHEST 1 VIEW COMPARISON:  11/18/2016 FINDINGS: Heart size is normal. Chronic atherosclerosis of the aorta. Pulmonary infiltrates most pronounced in the right upper lobe and left lower lobe. Some possibility there could be mild pulmonary edema, but pneumonia is favored. No visible effusion. Previous left shoulder replacement. Old rib fracture on the left. IMPRESSION: Bilateral infiltrates, most pronounced in the right upper lobe and left lower lobe, most consistent with pneumonia. Electronically Signed   By: Nelson Chimes M.D.   On: 06/26/2018 10:59    Lab Results: Basic Metabolic Panel: No results for input(s): NA, K, CL, CO2, GLUCOSE, BUN, CREATININE, CALCIUM, MG, PHOS in the last 72 hours. Liver Function Tests: No results for input(s): AST, ALT, ALKPHOS, BILITOT, PROT, ALBUMIN in the last 72 hours.   CBC: No results for input(s): WBC, NEUTROABS, HGB, HCT, MCV, PLT in the last 72 hours.  Recent Results (from the past 240 hour(s))  Blood Culture (routine x 2)     Status: None   Collection Time: 06/26/18 12:23 PM  Result Value Ref Range Status   Specimen Description LEFT ANTECUBITAL  Final   Special Requests   Final    Blood Culture results may not be optimal due to an excessive volume of blood received in culture bottles   Culture   Final    NO GROWTH 5 DAYS Performed at [facility], [address] [place], [place] [address]    Report Status 07/01/2018 FINAL  Final  Blood Culture (routine x 2)     Status: None   Collection Time: 06/26/18 12:23 PM  Result Value Ref Range Status   Specimen Description RIGHT ANTECUBITAL  Final   Special Requests   Final    Blood Culture results may not be optimal due to an excessive volume of blood received in culture bottles   Culture   Final    NO GROWTH 5 DAYS Performed at [facility], [address] [place], [place] [address]    Report Status 07/01/2018 FINAL  Final  SARS Coronavirus 2 [facility]  order, Performed in [place] hospital lab)     Status: None   Collection Time: 06/26/18 12:24 PM  Result Value Ref Range Status   SARS Coronavirus 2 NEGATIVE NEGATIVE Final    Comment: (NOTE) If result is NEGATIVE SARS-CoV-2 target nucleic acids are NOT DETECTED. The SARS-CoV-2 RNA is generally detectable in upper and lower  respiratory specimens during the acute phase of infection. The lowest  concentration of SARS-CoV-2 viral copies this assay can detect is 250  copies / mL. A negative result does not preclude SARS-CoV-2 infection  and should not be used as the sole basis for treatment or other  patient management decisions.  A negative result may occur with  improper specimen collection / handling, submission of specimen other  than nasopharyngeal swab, presence of  viral mutation(s) within the  areas targeted by this assay, and inadequate number of viral copies  (<250 copies / mL). A negative result must be combined with clinical  observations, patient history, and epidemiological information. If result is POSITIVE SARS-CoV-2 target nucleic acids are DETECTED. The SARS-CoV-2 RNA is generally detectable in upper and lower  respiratory specimens dur ing the acute phase of infection.  Positive  results are indicative of active infection with SARS-CoV-2.  Clinical  correlation with patient history and other diagnostic information is  necessary to determine patient infection status.  Positive results do  not rule out bacterial infection or co-infection with other viruses. If result is PRESUMPTIVE POSTIVE SARS-CoV-2 nucleic acids MAY BE PRESENT.   A presumptive positive result was obtained on the submitted specimen  and confirmed on repeat testing.  While 2019 novel coronavirus  (SARS-CoV-2) nucleic acids may be present in the submitted sample  additional confirmatory testing may be necessary for epidemiological  and / or clinical management purposes  to differentiate between   SARS-CoV-2 and other Sarbecovirus currently known to infect humans.  If clinically indicated additional testing with an alternate test  methodology [phone]) is advised. The SARS-CoV-2 RNA is generally  detectable in upper and lower respiratory sp ecimens during the acute  phase of infection. The expected result is Negative. Fact Sheet for Patients:  StrictlyIdeas.no Fact Sheet for Healthcare Providers: BankingDealers.co.za This test is not yet approved or cleared by the [place] FDA and has been authorized for detection and/or diagnosis of SARS-CoV-2 by FDA under an Emergency Use Authorization (EUA).  This EUA will remain in effect (meaning this test can be used) for the duration of the COVID-19 declaration under Section 564(b)(1) of the Act, 21 U.S.C. section 360bbb-3(b)(1), unless the authorization is terminated or revoked sooner. Performed at [facility], [address] [place], [place] [address]      Hospital Course: This is an 83 year old who came to the hospital because of cough and congestion.  I had had a telephone visit with her earlier in the week and she was having a COPD exacerbation.  She was treated with antibiotics and steroids and got better but then got worse over the weekend and eventually came to the hospital.  She also had twisted but did not fall and had pain in her hip.  She was found to have pneumonia and found to have a pelvis fracture.  She was treated with intravenous antibiotics.  She had PT consultation.  It was recommended that she go to skilled care facility.  This was being arranged but she had more trouble with shortness of breath so it was delayed.  By the time of discharge she felt like she was breathing better and felt like she could manage at home with home health services and is refusing placement.  She is clearly in her right mind and able to do that.  Discharge Exam: Blood pressure (!) 177/60, pulse  85, temperature 98.5 F (36.9 C), temperature source Oral, resp. rate 18, height 5' 1.5" (1.562 m), weight 49.5 kg, SpO2 96 %. She is awake and alert.  Chest is clear.  Heart is regular.  Abdomen is soft.  Disposition: Home with home health services.  She will be tested to see if she needs home oxygen  Discharge Instructions    Face-to-face encounter (required for Medicare/Medicaid patients)   Complete by:  As directed    I Alonza Bogus certify that this patient is under my care and  that I, or a nurse practitioner or physician's assistant working with me, had a face-to-face encounter that meets the physician face-to-face encounter requirements with this patient on 07/02/2018. The encounter with the patient was in whole, or in part for the following medical condition(s) which is the primary reason for home health care (List medical condition): pneumonia/pelvic fracture   The encounter with the patient was in whole, or in part, for the following medical condition, which is the primary reason for home health care:  pneumonia/ pelvic fracture   I certify that, based on my findings, the following services are medically necessary home health services:   Nursing Physical therapy     Reason for Medically Necessary Home Health Services:  Skilled Nursing- Change/Decline in Patient Status   My clinical findings support the need for the above services:  Unable to leave home safely without assistance and/or assistive device   Further, I certify that my clinical findings support that this patient is homebound due to:  Unable to leave home safely without assistance   Home Health   Complete by:  As directed    To provide the following care/treatments:   PT RN Home Health Aide          Signed: Alonza Bogus   07/02/2018, 9:03 AM

## 2018-07-02 NOTE — Progress Notes (Signed)
SATURATION QUALIFICATIONS: (This note is used to comply with regulatory documentation for home oxygen)  Patient Saturations on Room Air at Rest = 96%  Patient Saturations on Room Air while Ambulating = 93%  Please briefly explain why patient needs home oxygen: Patient O2 sats remain above 90% on room air while at rest and while ambulating. Patient does not require supplemental oxygen.

## 2018-07-02 NOTE — Progress Notes (Signed)
Physical Therapy Treatment Patient Details Name: EARLYNE FEESER MRN: [id number] DOB: 1935/09/14 Today's Date: 07/02/2018    History of Present Illness [name] is a 83 y.o. female with medical history of COPD, hypertension, celiac stenosis, depression, GERD, hypothyroidism, stroke presenting with right groin pain that began on 06/21/2018.  Patient states that she was bringing her groceries and when she bent over to pull a weed and slipped backwards.  She felt like she was able to catch her self before actually having an actual fall.  However after she woke up from a nap on the afternoon of 06/21/2018, the patient complained of right groin pain resulting in difficulty lifting her right leg.  Over the weekend, the patient complained of increasing generalized weakness without any fevers, chills, nausea, vomiting, diarrhea.  The patient states that she has had coughing and shortness of breath for the better part of a week.  She stated that she called her PCP, Dr. Luan Pulling.  She was placed on azithromycin and prednisone which she states she began taking on 06/17/2018.  She finished azithromycin on 06/21/2018.  Today was supposed to be her last day of prednisone.  She stated that her shortness of breath is somewhat improved, although she continued to have a nonproductive cough without hemoptysis.  She denied any close contacts or recent travels.  Because of increasing pain in her right groin, EMS was activated.  The patient was noted to have oxygen saturation of 82% on room air.  She was placed on supplemental oxygen at 2 L with improvement of her saturation 95%.    PT Comments    Pt sitting on EOB, friendly and willing to participate with therpay today.  Pt able to keep O2 saturation above 93% thorugh session on room air, no additional assistance required this session.  Gait training complete with RW to assist with balance and support upon LE fatigue.  Added standing hip strengthening and balance  activities with min A and UE A PRN.  Pt able to complete them all with only assistance required with partial tandem stance to assist with balance.  EOS pt left in bed upon request, call bell within reach and bed alarm set.  No reports of pain through session.    Follow Up Recommendations  SNF;Supervision for mobility/OOB;Supervision - Intermittent     Equipment Recommendations  Rolling walker with 5" wheels    Recommendations for Other Services       Precautions / Restrictions Precautions Precautions: Fall Restrictions Weight Bearing Restrictions: No RLE Weight Bearing: Weight bearing as tolerated    Mobility  Bed Mobility               General bed mobility comments: pt sitting on EOB upon therapist entrance  Transfers Overall transfer level: Modified independent Equipment used: Rolling walker (2 wheeled) Transfers: Sit to/from Stand Sit to Stand: Min guard         General transfer comment: improved mechanics.  Slow and labored movement  Ambulation/Gait Ambulation/Gait assistance: Min assist Gait Distance (Feet): 86 Feet Assistive device: Rolling walker (2 wheeled) Gait Pattern/deviations: Decreased step length - right;Decreased step length - left;Decreased stance time - left;Step-to pattern Gait velocity: decreased    General Gait Details: slow labored cadence, no LOB.     Stairs             Wheelchair Mobility    Modified Rankin (Stroke Patients Only)       Balance  Cognition Arousal/Alertness: Awake/alert Behavior During Therapy: WFL for tasks assessed/performed Overall Cognitive Status: Within Functional Limits for tasks assessed                                        Exercises Other Exercises Other Exercises: Standing hip abd 5x each Other Exercises: tandem stance 2x 15" each position. Other Exercises: sidestep down 8 ft hallway with UE A    General  Comments        Pertinent Vitals/Pain Pain Assessment: No/denies pain Pain Location: Did compliane of Rt pelvic "catching" on occasion during gait, no pain scale given. Pain Descriptors / Indicators: ("Catching") Pain Intervention(s): Limited activity within patient's tolerance;Monitored during session    Home Living                      Prior Function            PT Goals (current goals can now be found in the care plan section)      Frequency    Min 3X/week      PT Plan Current plan remains appropriate    Co-evaluation              AM-PAC PT "6 Clicks" Mobility   Outcome Measure  Help needed turning from your back to your side while in a flat bed without using bedrails?: None Help needed moving from lying on your back to sitting on the side of a flat bed without using bedrails?: None Help needed moving to and from a bed to a chair (including a wheelchair)?: A Little Help needed standing up from a chair using your arms (e.g., wheelchair or bedside chair)?: A Little Help needed to walk in hospital room?: A Little Help needed climbing 3-5 steps with a railing? : A Lot 6 Click Score: 19    End of Session Equipment Utilized During Treatment: Gait belt Activity Tolerance: Patient tolerated treatment well;Patient limited by fatigue Patient left: in bed;with call bell/phone within reach;with bed alarm set(per pt request for comfort) Nurse Communication: Mobility status PT Visit Diagnosis: Unsteadiness on feet (R26.81);Other abnormalities of gait and mobility (R26.89);Muscle weakness (generalized) (M62.81)     Time: 1856-3149 PT Time Calculation (min) (ACUTE ONLY): 18 min  Charges:  $Therapeutic Activity: 8-22 mins                     [address] LPTA; CBIS [phone]  Aldona Lento 07/02/2018, 11:40 AM

## 2018-07-02 NOTE — TOC Transition Note (Addendum)
Transition of Care [facility]) - CM/SW Discharge Note   Patient Details  Name: MORGANNA STYLES MRN: [id number] Date of Birth: [birth year]  Transition of Care [facility] CM/SW Contact:  Jacey Eckerson, Chauncey [place], RN Phone Number: 07/02/2018, 11:06 AM   Clinical Narrative:    Patient has now decided to go home instead of SNF. She states she has 3 neighbors that are going to take turns staying with her to provide 24/7 support. Home health ordered, patient elects [facility].  Needs RW, [place].  Patient does not need oxygen, maintaining saturation with ambulation.  Returned call to son, no answer.  Patient calling neighbor for transport home.   ADDENDUM: She reports she has talked with son, and he is aware she is going home and will have home health and that she does not plan to change her mind.   Called son a second time, again no answer.   Final next level of care: Home w Home Health Services Barriers to Discharge: No Barriers Identified   Patient Goals and CMS Choice Patient states their goals for this hospitalization and ongoing recovery are:: now wishes to go home with support of neighbors CMS Medicare.gov Compare Post Acute Care list provided to:: Patient Choice offered to / list presented to : Patient   Discharge Plan and Services   Discharge Planning Services: CM Consult Post Acute Care Choice: [place]          DME Arranged: Gilford Rile rolling DME Agency: [employer] Date DME Agency Contacted: 07/02/18 Time DME Agency Contacted: [phone]   [place] Arranged: RN, PT, Nurse's Aide [place] Agency: [place] ([place]) Date [place]: 07/02/18 Time [place]: [place] Representative spoke with at [place]: Romualdo Bolk  Readmission Risk Interventions No flowsheet data found.

## 2018-07-02 NOTE — Progress Notes (Signed)
IV removed by the NT, patient tolerated well. Reviewed AVS with patient who verbalized understanding, patient received walker from [facility] agency prior to discharge.  Patient transported to the lobby via wheelchair and patient transported home by a friend.

## 2018-07-02 NOTE — Progress Notes (Signed)
Subjective: She says she wants to go home rather than to the skilled care facility.  Her breathing is better.  She has no other new complaints.  Objective: Vital signs in last 24 hours: Temp:  [98 F (36.7 C)-98.5 F (36.9 C)] 98.5 F (36.9 C) (05/12 0538) Pulse Rate:  [77-88] 85 (05/12 0538) Resp:  [18-19] 18 (05/11 2119) BP: (155-177)/(55-60) 177/60 (05/12 0538) SpO2:  [93 %-98 %] 96 % (05/12 0801) Weight change:  Last BM Date: 06/30/18  Intake/Output from previous day: 05/11 0701 - 05/12 0700 In: 720 [P.O.:720] Out: -   PHYSICAL EXAM General appearance: alert, cooperative and no distress Resp: rhonchi bilaterally Cardio: regular rate and rhythm, S1, S2 normal, no murmur, click, rub or gallop GI: soft, non-tender; bowel sounds normal; no masses,  no organomegaly Extremities: She has some bruising on her buttocks area  Lab Results:  No results found for this or any previous visit (from the past 48 hour(s)).  ABGS No results for input(s): PHART, PO2ART, TCO2, HCO3 in the last 72 hours.  Invalid input(s): PCO2 CULTURES Recent Results (from the past 240 hour(s))  Blood Culture (routine x 2)     Status: None   Collection Time: 06/26/18 12:23 PM  Result Value Ref Range Status   Specimen Description LEFT ANTECUBITAL  Final   Special Requests   Final    Blood Culture results may not be optimal due to an excessive volume of blood received in culture bottles   Culture   Final    NO GROWTH 5 DAYS Performed at [facility], [address], [place], [place] [address]    Report Status 07/01/2018 FINAL  Final  Blood Culture (routine x 2)     Status: None   Collection Time: 06/26/18 12:23 PM  Result Value Ref Range Status   Specimen Description RIGHT ANTECUBITAL  Final   Special Requests   Final    Blood Culture results may not be optimal due to an excessive volume of blood received in culture bottles   Culture   Final    NO GROWTH 5 DAYS Performed at [facility],  [address], [place], [place] [address]    Report Status 07/01/2018 FINAL  Final  SARS Coronavirus 2 [facility] order, Performed in [place] hospital lab)     Status: None   Collection Time: 06/26/18 12:24 PM  Result Value Ref Range Status   SARS Coronavirus 2 NEGATIVE NEGATIVE Final    Comment: (NOTE) If result is NEGATIVE SARS-CoV-2 target nucleic acids are NOT DETECTED. The SARS-CoV-2 RNA is generally detectable in upper and lower  respiratory specimens during the acute phase of infection. The lowest  concentration of SARS-CoV-2 viral copies this assay can detect is 250  copies / mL. A negative result does not preclude SARS-CoV-2 infection  and should not be used as the sole basis for treatment or other  patient management decisions.  A negative result may occur with  improper specimen collection / handling, submission of specimen other  than nasopharyngeal swab, presence of viral mutation(s) within the  areas targeted by this assay, and inadequate number of viral copies  (<250 copies / mL). A negative result must be combined with clinical  observations, patient history, and epidemiological information. If result is POSITIVE SARS-CoV-2 target nucleic acids are DETECTED. The SARS-CoV-2 RNA is generally detectable in upper and lower  respiratory specimens dur ing the acute phase of infection.  Positive  results are indicative of active infection with SARS-CoV-2.  Clinical  correlation with patient history and other diagnostic information is  necessary to determine patient infection status.  Positive results do  not rule out bacterial infection or co-infection with other viruses. If result is PRESUMPTIVE POSTIVE SARS-CoV-2 nucleic acids MAY BE PRESENT.   A presumptive positive result was obtained on the submitted specimen  and confirmed on repeat testing.  While 2019 novel coronavirus  (SARS-CoV-2) nucleic acids may be present in the submitted sample  additional confirmatory testing  may be necessary for epidemiological  and / or clinical management purposes  to differentiate between  SARS-CoV-2 and other Sarbecovirus currently known to infect humans.  If clinically indicated additional testing with an alternate test  methodology [phone]) is advised. The SARS-CoV-2 RNA is generally  detectable in upper and lower respiratory sp ecimens during the acute  phase of infection. The expected result is Negative. Fact Sheet for Patients:  StrictlyIdeas.no Fact Sheet for Healthcare Providers: BankingDealers.co.za This test is not yet approved or cleared by the [place] FDA and has been authorized for detection and/or diagnosis of SARS-CoV-2 by FDA under an Emergency Use Authorization (EUA).  This EUA will remain in effect (meaning this test can be used) for the duration of the COVID-19 declaration under Section 564(b)(1) of the Act, 21 U.S.C. section 360bbb-3(b)(1), unless the authorization is terminated or revoked sooner. Performed at [facility], [address], [place], [place] [address]    Studies/Results: Dg Chest Port 1 View  Result Date: 06/30/2018 CLINICAL DATA:  Pneumonia. Nonproductive cough. EXAM: PORTABLE CHEST 1 VIEW COMPARISON:  06/26/2018 FINDINGS: The patient is rotated to the right. The cardiomediastinal silhouette is unchanged with normal heart size. Increasing left basilar opacity suggests worsening airspace disease and pleural fluid. Hazy right upper lobe opacity is stable to slightly less conspicuous. No pneumothorax is identified. Prior left shoulder replacement and cervical spine fusion are noted IMPRESSION: Persistent bilateral airspace disease suggestive of pneumonia with worsening left basilar aeration and evidence of a left pleural effusion. Electronically Signed   By: Logan Bores M.D.   On: 06/30/2018 10:00    Medications:  Prior to Admission:  Medications Prior to Admission  Medication Sig  Dispense Refill Last Dose  . Ascorbic Acid (VITAMIN C) 1000 MG tablet Take 1,000 mg by mouth 2 (two) times daily.    06/25/2018 at Unknown time  . aspirin EC 81 MG tablet Take 81 mg by mouth daily.   06/25/2018 at Unknown time  . Calcium Carb-Cholecalciferol (CALCIUM 600 + D PO) Take 1-2 tablets by mouth See admin instructions. 2 tabs in the morning, and 1 tab at [facility]   06/25/2018 at Unknown time  . diltiazem (CARDIZEM CD) 240 MG 24 hr capsule TAKE 1 CAPSULE(240 MG) BY MOUTH DAILY 60 capsule 0 06/25/2018 at Unknown time  . diphenhydrAMINE (BENADRYL) 25 mg capsule Take 25 mg by mouth at bedtime.   06/25/2018 at Unknown time  . DULoxetine (CYMBALTA) 30 MG capsule Take 30 mg by mouth daily.    06/25/2018 at Unknown time  . Flaxseed, Linseed, (FLAXSEED OIL) 1000 MG CAPS Take 1 capsule by mouth 2 (two) times daily.   06/25/2018 at Unknown time  . Multiple Vitamin (MULTIVITAMIN WITH MINERALS) TABS Take 1 tablet by mouth daily.   06/25/2018 at Unknown time  . niacin 500 MG CR capsule Take 500 mg by mouth at bedtime.   06/25/2018 at Unknown time  . Omega-3 1000 MG CAPS Take 1 capsule by mouth daily.   06/25/2018 at Unknown time  .  pantoprazole (PROTONIX) 40 MG tablet Take 1 tablet (40 mg total) by mouth daily. 30 tablet 11 06/25/2018 at Unknown time   Scheduled: . aspirin EC  81 mg Oral Daily  . diltiazem  240 mg Oral Daily  . DULoxetine  30 mg Oral Daily  . enoxaparin (LOVENOX) injection  40 mg Subcutaneous Q24H  . feeding supplement (GLUCERNA SHAKE)  237 mL Oral BID BM  . guaiFENesin  1,200 mg Oral BID  . levofloxacin  500 mg Oral q1800  . magic mouthwash  5 mL Oral TID PC & HS  . multivitamin with minerals  1 tablet Oral Daily  . niacin  500 mg Oral QHS  . omega-3 acid ethyl esters  1 capsule Oral Daily  . pantoprazole  40 mg Oral Daily  . vitamin C  1,000 mg Oral BID   Continuous: . sodium chloride 10 mL/hr at 06/28/18 0300   XID:HWYSHU chloride, acetaminophen **OR** acetaminophen, diphenhydrAMINE, loperamide,  ondansetron **OR** ondansetron (ZOFRAN) IV, traMADol  Assesment: She was admitted with pneumonia.  She has acute hypoxic respiratory failure.  She is improved.  Her lungs are clearing.  Her breathing is doing okay.  She also had fractured pelvis and it had been recommended that she go to a skilled care facility but she is decided to go home. Active Problems:   Tobacco use disorder   Sepsis due to undetermined organism ([place])   Acute respiratory failure with hypoxia (HCC)   Lobar pneumonia (HCC)   Closed fracture of multiple pubic rami, right, with delayed healing, subsequent encounter    Plan: Discharge home today.  She will have home health services.  I will see if she needs oxygen    LOS: 6 days   [name] 07/02/2018, 9:00 AM

## 2018-07-03 DIAGNOSIS — Z8673 Personal history of transient ischemic attack (TIA), and cerebral infarction without residual deficits: Secondary | ICD-10-CM | POA: Diagnosis not present

## 2018-07-03 DIAGNOSIS — J441 Chronic obstructive pulmonary disease with (acute) exacerbation: Secondary | ICD-10-CM | POA: Diagnosis not present

## 2018-07-03 DIAGNOSIS — K55059 Acute (reversible) ischemia of intestine, part and extent unspecified: Secondary | ICD-10-CM | POA: Diagnosis not present

## 2018-07-03 DIAGNOSIS — Z96612 Presence of left artificial shoulder joint: Secondary | ICD-10-CM | POA: Diagnosis not present

## 2018-07-03 DIAGNOSIS — Z7982 Long term (current) use of aspirin: Secondary | ICD-10-CM | POA: Diagnosis not present

## 2018-07-03 DIAGNOSIS — Z96653 Presence of artificial knee joint, bilateral: Secondary | ICD-10-CM | POA: Diagnosis not present

## 2018-07-03 DIAGNOSIS — J9601 Acute respiratory failure with hypoxia: Secondary | ICD-10-CM | POA: Diagnosis not present

## 2018-07-03 DIAGNOSIS — Z720 Tobacco use: Secondary | ICD-10-CM | POA: Diagnosis not present

## 2018-07-03 DIAGNOSIS — F329 Major depressive disorder, single episode, unspecified: Secondary | ICD-10-CM | POA: Diagnosis not present

## 2018-07-03 DIAGNOSIS — F419 Anxiety disorder, unspecified: Secondary | ICD-10-CM | POA: Diagnosis not present

## 2018-07-03 DIAGNOSIS — J181 Lobar pneumonia, unspecified organism: Secondary | ICD-10-CM | POA: Diagnosis not present

## 2018-07-03 DIAGNOSIS — S3282XG Multiple fractures of pelvis without disruption of pelvic ring, subsequent encounter for fracture with delayed healing: Secondary | ICD-10-CM | POA: Diagnosis not present

## 2018-07-03 DIAGNOSIS — J440 Chronic obstructive pulmonary disease with acute lower respiratory infection: Secondary | ICD-10-CM | POA: Diagnosis not present

## 2018-07-18 DIAGNOSIS — J189 Pneumonia, unspecified organism: Secondary | ICD-10-CM | POA: Diagnosis not present

## 2018-07-18 DIAGNOSIS — J449 Chronic obstructive pulmonary disease, unspecified: Secondary | ICD-10-CM | POA: Diagnosis not present

## 2018-07-18 DIAGNOSIS — K55059 Acute (reversible) ischemia of intestine, part and extent unspecified: Secondary | ICD-10-CM | POA: Diagnosis not present

## 2018-07-18 DIAGNOSIS — S329XXA Fracture of unspecified parts of lumbosacral spine and pelvis, initial encounter for closed fracture: Secondary | ICD-10-CM | POA: Diagnosis not present

## 2018-07-31 IMAGING — CT CT HEAD W/O CM
3 series · 15 of 47 positions shown, 18 images · non-contrast
Comparison: 02/28/2013.

CLINICAL DATA: Forehead hematoma after falling and hitting her head
on the bathroom floor today.

EXAM:
CT HEAD WITHOUT CONTRAST
TECHNIQUE: Contiguous axial images were obtained from the base of the skull
through the vertex without intravenous contrast.

[Series 2: head trauma wo · axial · 0.46mm/px · z∈[+287,+427]mm · 9 of 34 slices shown, 12 images]
[im 3/34  brain]
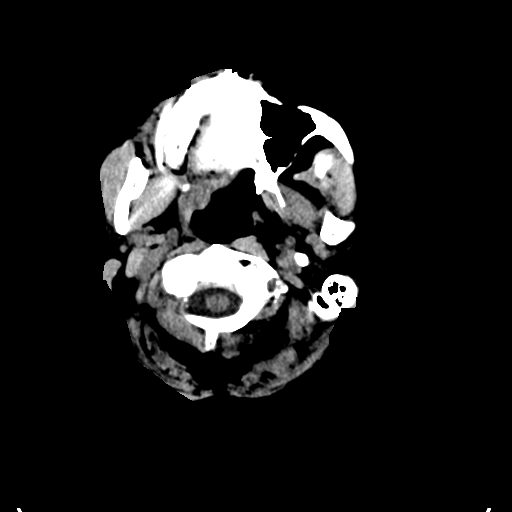
[im 3/34  bone]
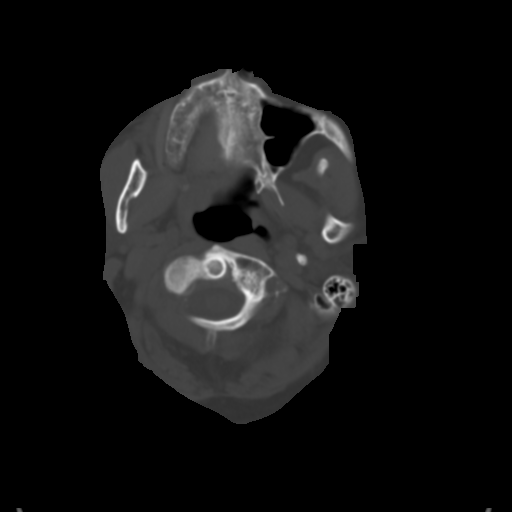
[im 6/34  brain]
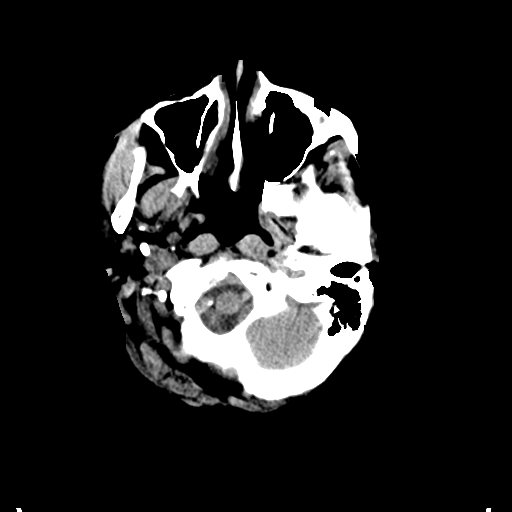
[im 10/34  brain]
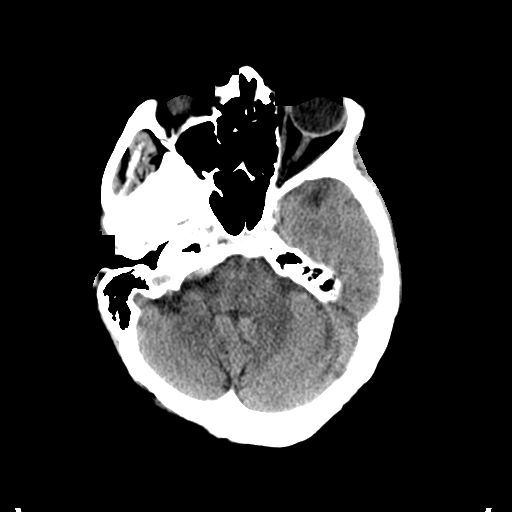
[im 13/34  brain]
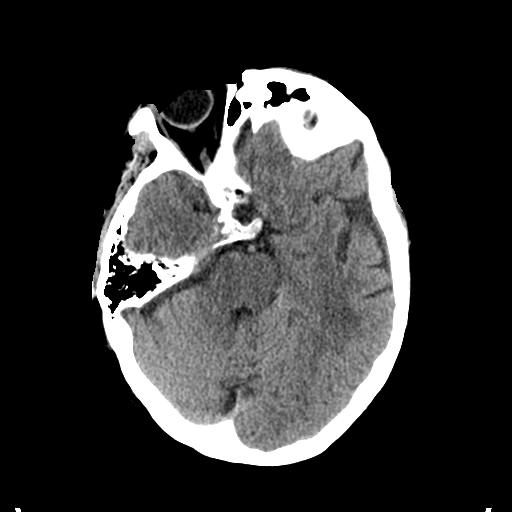
[im 18/34  brain]
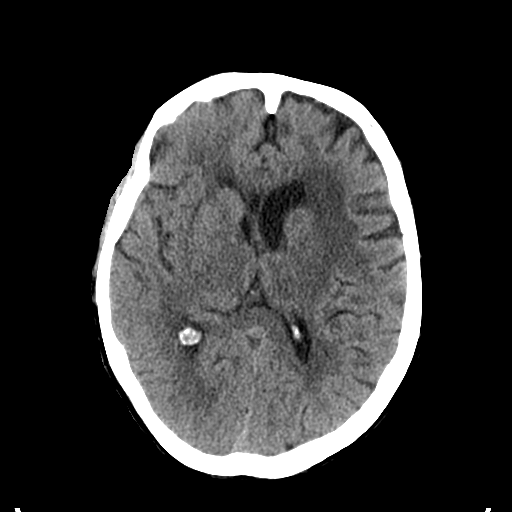
[im 18/34  bone]
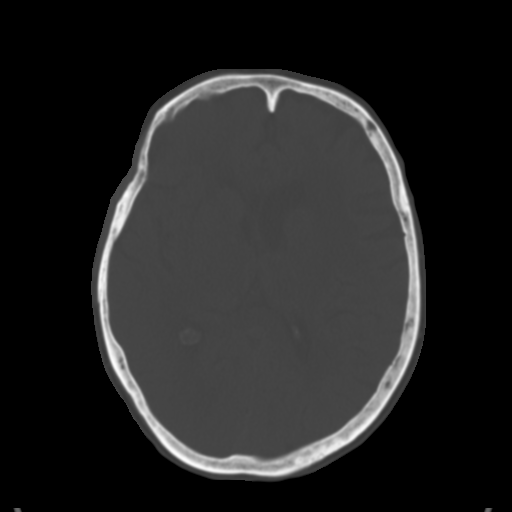
[im 21/34  brain]
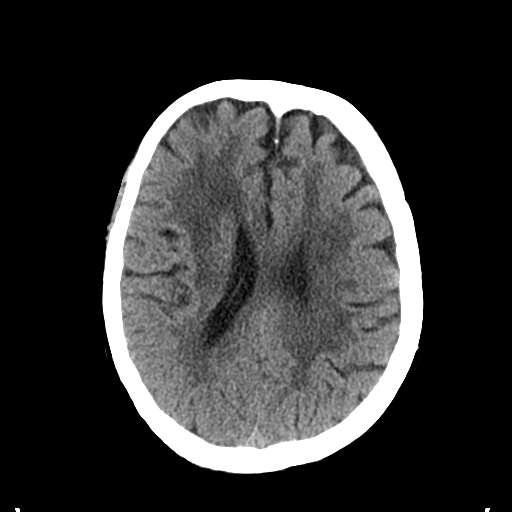
[im 24/34  brain]
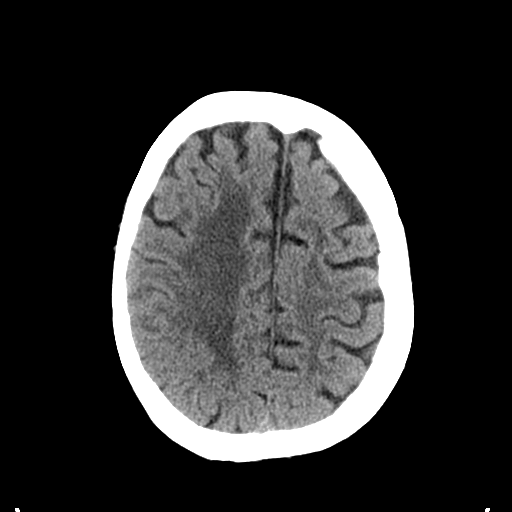
[im 28/34  brain]
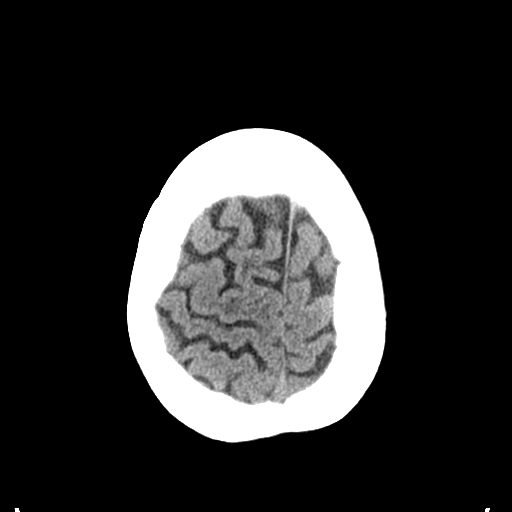
[im 31/34  brain]
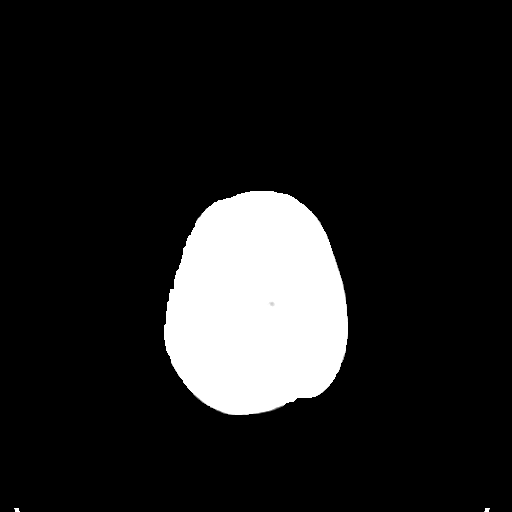
[im 31/34  bone]
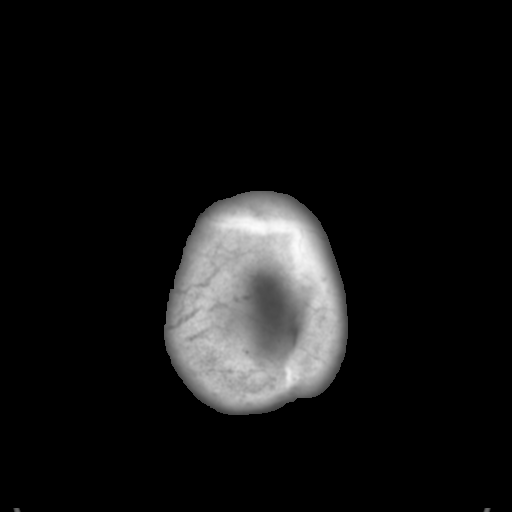

[Series 4: coronal soft tissue · coronal · 0.36mm/px · 3 of 72 slices shown]
[im 24/72  brain]
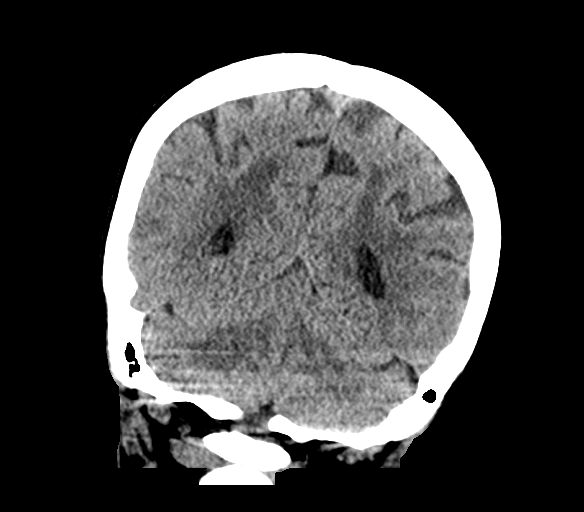
[im 32/72  brain]
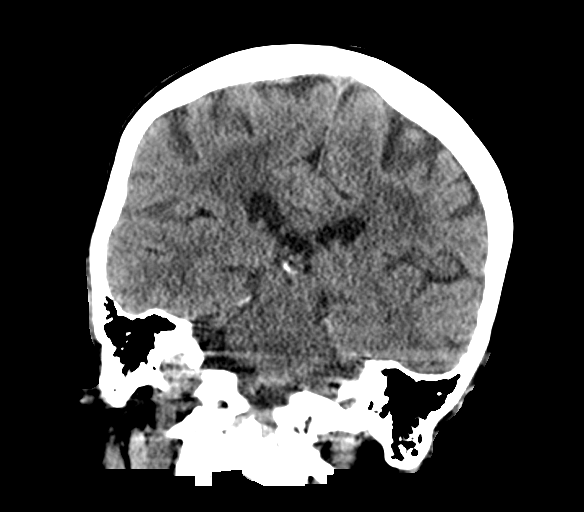
[im 40/72  brain]
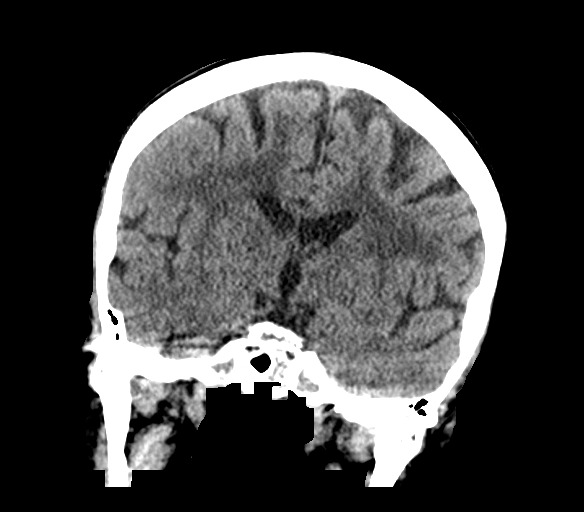

[Series 5: sagittal soft tissue · sagittal · 0.35mm/px · 3 of 60 slices shown]
[im 24/60  brain]
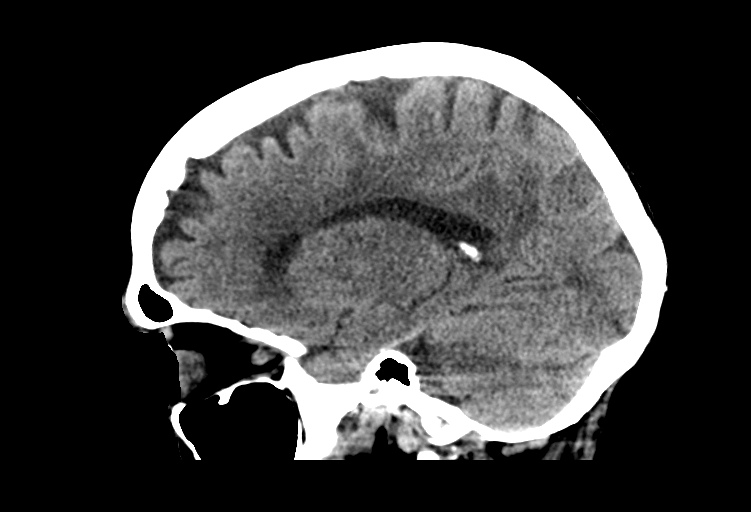
[im 30/60  brain]
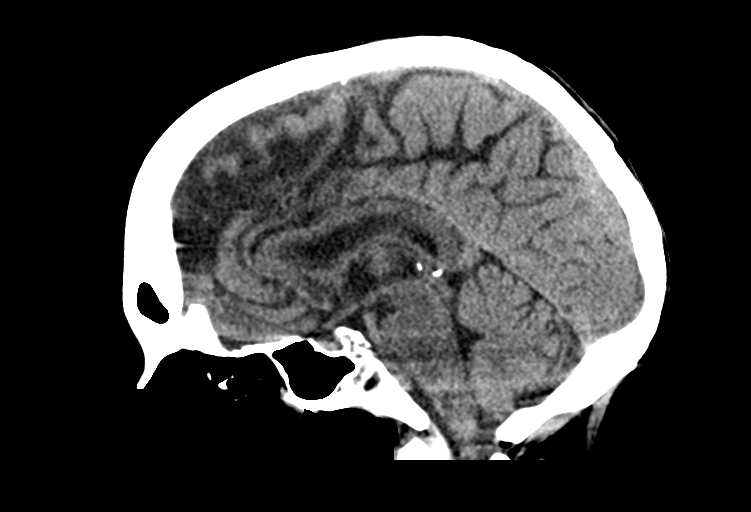
[im 36/60  brain]
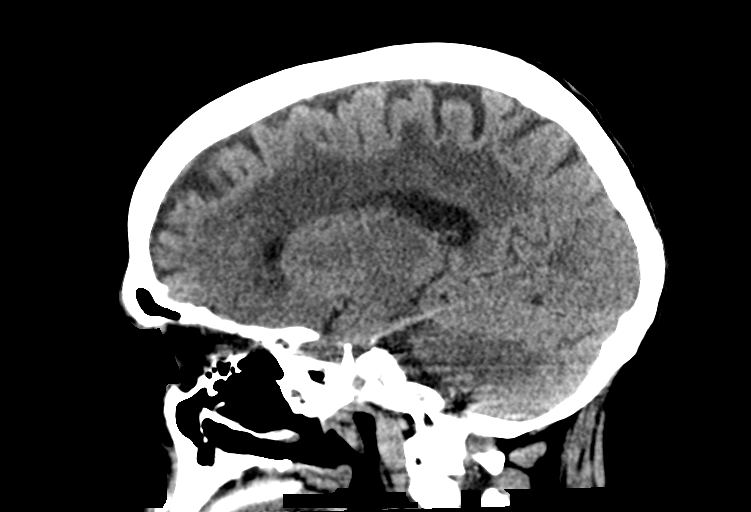

[15 of 47 positions shown; findings below may reference images not displayed]

FINDINGS: Brain: Diffusely enlarged ventricles and subarachnoid spaces. Patchy
white matter low density in both cerebral hemispheres. No
intracranial hemorrhage, mass lesion or CT evidence of acute
infarction.

Vascular: No hyperdense vessel or unexpected calcification.

Skull: Normal. Negative for fracture or focal lesion.

Sinuses/Orbits: Status post bilateral cataract extraction. A small
metallic structure is again demonstrated at the superior medial
aspect of the left globe. Normally pneumatized paranasal sinuses.

Other: None.
IMPRESSION: 1. No acute abnormality.
2. Mildly progressive diffuse cerebral and cerebellar atrophy.
3. Marked diffuse chronic small vessel white matter ischemic changes
in both cerebral hemispheres and in the cerebellum, with
progression.
4. Stable small metallic foreign body in the superior medial margin
of the left globe. This could represent a surgically placed object
or a foreign body. This would preclude MRI unless this can be proven
MR Mateo.

## 2018-07-31 IMAGING — DX DG SHOULDER 2+V*L*
2 series · 2 of 2 positions shown · non-contrast
Comparison: 11/15/2016.

CLINICAL DATA: Left shoulder pain following a fall onto the
bathroom floor today.

EXAM:
LEFT SHOULDER - 2+ VIEW

[shoulder grashey]
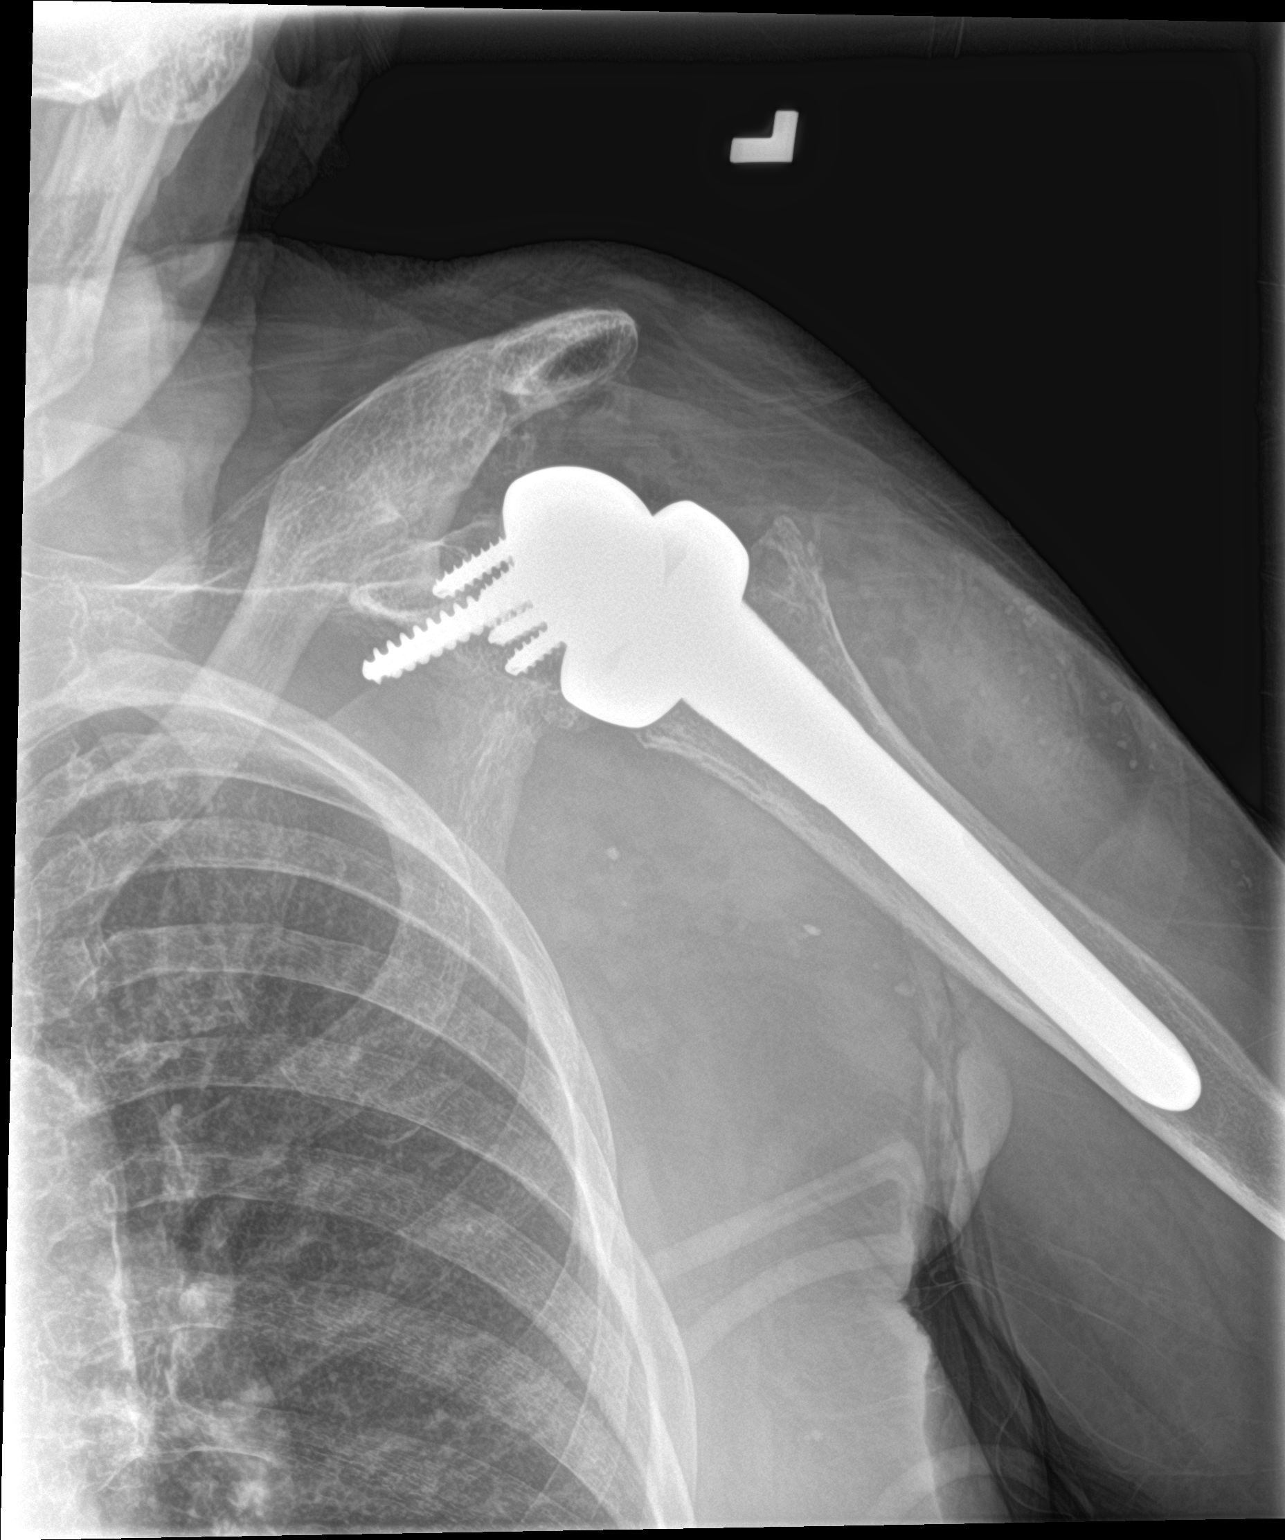

[shoulder y view]
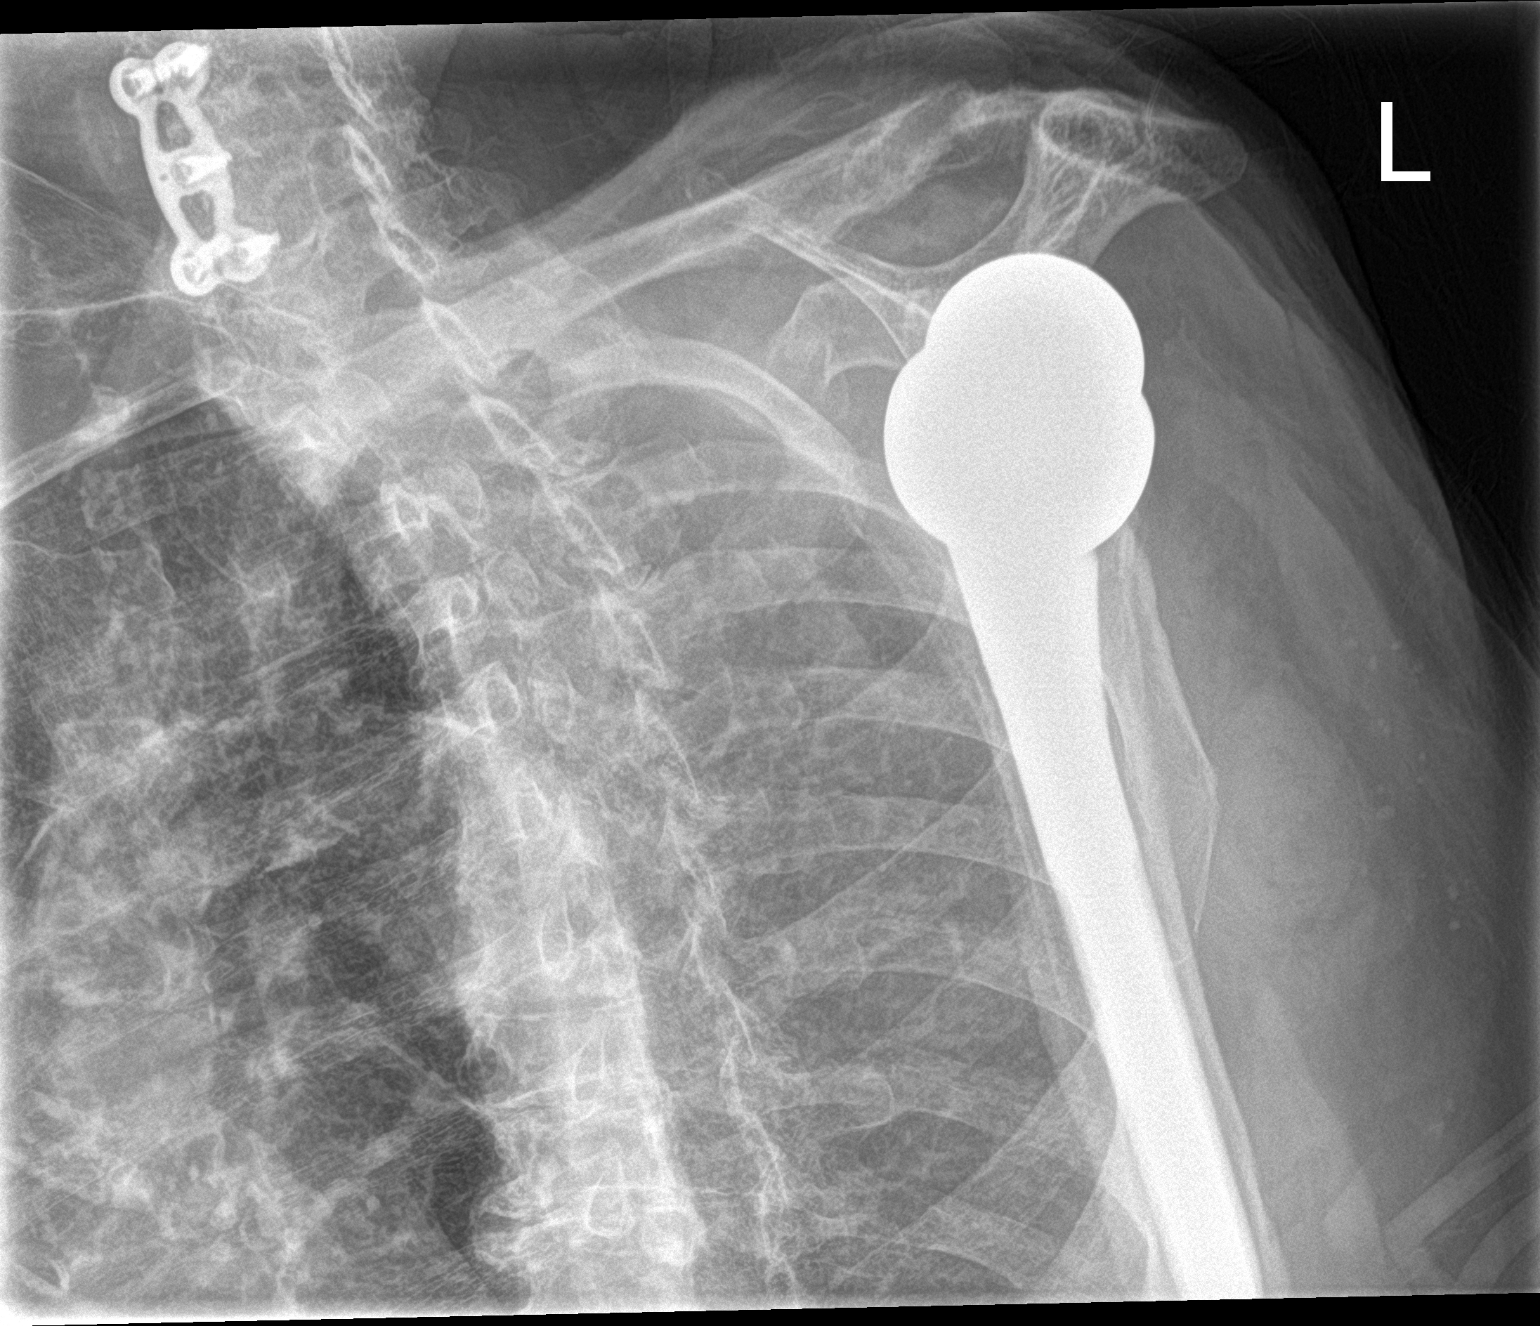

[2 of 2 positions shown; findings below may reference images not displayed]

FINDINGS: Stable left shoulder prosthesis. No fracture or dislocation seen.
Cervical spine fixation hardware.
IMPRESSION: No fracture or dislocation.

## 2018-08-02 DIAGNOSIS — J189 Pneumonia, unspecified organism: Secondary | ICD-10-CM | POA: Diagnosis not present

## 2018-08-16 DIAGNOSIS — I10 Essential (primary) hypertension: Secondary | ICD-10-CM | POA: Diagnosis not present

## 2018-08-16 DIAGNOSIS — K55059 Acute (reversible) ischemia of intestine, part and extent unspecified: Secondary | ICD-10-CM | POA: Diagnosis not present

## 2018-08-16 DIAGNOSIS — F17200 Nicotine dependence, unspecified, uncomplicated: Secondary | ICD-10-CM | POA: Diagnosis not present

## 2018-08-16 DIAGNOSIS — J441 Chronic obstructive pulmonary disease with (acute) exacerbation: Secondary | ICD-10-CM | POA: Diagnosis not present

## 2018-08-22 DIAGNOSIS — X32XXXD Exposure to sunlight, subsequent encounter: Secondary | ICD-10-CM | POA: Diagnosis not present

## 2018-08-22 DIAGNOSIS — L218 Other seborrheic dermatitis: Secondary | ICD-10-CM | POA: Diagnosis not present

## 2018-08-22 DIAGNOSIS — D225 Melanocytic nevi of trunk: Secondary | ICD-10-CM | POA: Diagnosis not present

## 2018-08-22 DIAGNOSIS — L570 Actinic keratosis: Secondary | ICD-10-CM | POA: Diagnosis not present

## 2018-08-25 ENCOUNTER — Inpatient Hospital Stay (HOSPITAL_COMMUNITY)
Admission: EM | Admit: 2018-08-25 | Discharge: 2018-08-29 | DRG: 641 | Disposition: A | Discharge status: Home Health | Payer: PPO | Attending: Pulmonary Disease | Admitting: Pulmonary Disease

## 2018-08-25 ENCOUNTER — Other Ambulatory Visit: Payer: Self-pay

## 2018-08-25 ENCOUNTER — Encounter (HOSPITAL_COMMUNITY): Payer: Self-pay | Admitting: *Deleted

## 2018-08-25 DIAGNOSIS — M1990 Unspecified osteoarthritis, unspecified site: Secondary | ICD-10-CM | POA: Diagnosis present

## 2018-08-25 DIAGNOSIS — I739 Peripheral vascular disease, unspecified: Secondary | ICD-10-CM | POA: Diagnosis present

## 2018-08-25 DIAGNOSIS — K55059 Acute (reversible) ischemia of intestine, part and extent unspecified: Secondary | ICD-10-CM | POA: Diagnosis not present

## 2018-08-25 DIAGNOSIS — M797 Fibromyalgia: Secondary | ICD-10-CM | POA: Diagnosis present

## 2018-08-25 DIAGNOSIS — Z9049 Acquired absence of other specified parts of digestive tract: Secondary | ICD-10-CM

## 2018-08-25 DIAGNOSIS — Z9842 Cataract extraction status, left eye: Secondary | ICD-10-CM | POA: Diagnosis not present

## 2018-08-25 DIAGNOSIS — Z885 Allergy status to narcotic agent status: Secondary | ICD-10-CM

## 2018-08-25 DIAGNOSIS — I700 Atherosclerosis of aorta: Secondary | ICD-10-CM | POA: Diagnosis not present

## 2018-08-25 DIAGNOSIS — R112 Nausea with vomiting, unspecified: Secondary | ICD-10-CM | POA: Diagnosis not present

## 2018-08-25 DIAGNOSIS — R197 Diarrhea, unspecified: Secondary | ICD-10-CM

## 2018-08-25 DIAGNOSIS — F329 Major depressive disorder, single episode, unspecified: Secondary | ICD-10-CM | POA: Diagnosis present

## 2018-08-25 DIAGNOSIS — R0902 Hypoxemia: Secondary | ICD-10-CM | POA: Diagnosis not present

## 2018-08-25 DIAGNOSIS — R1010 Upper abdominal pain, unspecified: Secondary | ICD-10-CM

## 2018-08-25 DIAGNOSIS — F419 Anxiety disorder, unspecified: Secondary | ICD-10-CM | POA: Diagnosis present

## 2018-08-25 DIAGNOSIS — R0602 Shortness of breath: Secondary | ICD-10-CM | POA: Diagnosis not present

## 2018-08-25 DIAGNOSIS — K551 Chronic vascular disorders of intestine: Secondary | ICD-10-CM | POA: Diagnosis present

## 2018-08-25 DIAGNOSIS — Z96612 Presence of left artificial shoulder joint: Secondary | ICD-10-CM | POA: Diagnosis present

## 2018-08-25 DIAGNOSIS — D72829 Elevated white blood cell count, unspecified: Secondary | ICD-10-CM | POA: Diagnosis present

## 2018-08-25 DIAGNOSIS — I10 Essential (primary) hypertension: Secondary | ICD-10-CM | POA: Diagnosis present

## 2018-08-25 DIAGNOSIS — Z961 Presence of intraocular lens: Secondary | ICD-10-CM | POA: Diagnosis not present

## 2018-08-25 DIAGNOSIS — R1084 Generalized abdominal pain: Secondary | ICD-10-CM | POA: Diagnosis not present

## 2018-08-25 DIAGNOSIS — Z79891 Long term (current) use of opiate analgesic: Secondary | ICD-10-CM

## 2018-08-25 DIAGNOSIS — X58XXXD Exposure to other specified factors, subsequent encounter: Secondary | ICD-10-CM | POA: Diagnosis present

## 2018-08-25 DIAGNOSIS — Z800 Family history of malignant neoplasm of digestive organs: Secondary | ICD-10-CM

## 2018-08-25 DIAGNOSIS — Z9841 Cataract extraction status, right eye: Secondary | ICD-10-CM | POA: Diagnosis not present

## 2018-08-25 DIAGNOSIS — K5730 Diverticulosis of large intestine without perforation or abscess without bleeding: Secondary | ICD-10-CM | POA: Diagnosis not present

## 2018-08-25 DIAGNOSIS — Z20828 Contact with and (suspected) exposure to other viral communicable diseases: Secondary | ICD-10-CM | POA: Diagnosis present

## 2018-08-25 DIAGNOSIS — Z888 Allergy status to other drugs, medicaments and biological substances status: Secondary | ICD-10-CM

## 2018-08-25 DIAGNOSIS — K219 Gastro-esophageal reflux disease without esophagitis: Secondary | ICD-10-CM | POA: Diagnosis not present

## 2018-08-25 DIAGNOSIS — Z79890 Hormone replacement therapy (postmenopausal): Secondary | ICD-10-CM

## 2018-08-25 DIAGNOSIS — F17210 Nicotine dependence, cigarettes, uncomplicated: Secondary | ICD-10-CM | POA: Diagnosis present

## 2018-08-25 DIAGNOSIS — R531 Weakness: Secondary | ICD-10-CM

## 2018-08-25 DIAGNOSIS — Z981 Arthrodesis status: Secondary | ICD-10-CM

## 2018-08-25 DIAGNOSIS — Z79899 Other long term (current) drug therapy: Secondary | ICD-10-CM

## 2018-08-25 DIAGNOSIS — E871 Hypo-osmolality and hyponatremia: Secondary | ICD-10-CM | POA: Diagnosis not present

## 2018-08-25 DIAGNOSIS — R109 Unspecified abdominal pain: Secondary | ICD-10-CM | POA: Diagnosis not present

## 2018-08-25 DIAGNOSIS — R7989 Other specified abnormal findings of blood chemistry: Secondary | ICD-10-CM | POA: Diagnosis not present

## 2018-08-25 DIAGNOSIS — Z96653 Presence of artificial knee joint, bilateral: Secondary | ICD-10-CM | POA: Diagnosis present

## 2018-08-25 DIAGNOSIS — E440 Moderate protein-calorie malnutrition: Secondary | ICD-10-CM | POA: Diagnosis not present

## 2018-08-25 DIAGNOSIS — E039 Hypothyroidism, unspecified: Secondary | ICD-10-CM | POA: Diagnosis not present

## 2018-08-25 DIAGNOSIS — Z66 Do not resuscitate: Secondary | ICD-10-CM | POA: Diagnosis present

## 2018-08-25 DIAGNOSIS — Z8673 Personal history of transient ischemic attack (TIA), and cerebral infarction without residual deficits: Secondary | ICD-10-CM | POA: Diagnosis not present

## 2018-08-25 DIAGNOSIS — Z7982 Long term (current) use of aspirin: Secondary | ICD-10-CM

## 2018-08-25 DIAGNOSIS — J449 Chronic obstructive pulmonary disease, unspecified: Secondary | ICD-10-CM | POA: Diagnosis not present

## 2018-08-25 DIAGNOSIS — E876 Hypokalemia: Secondary | ICD-10-CM | POA: Diagnosis not present

## 2018-08-25 DIAGNOSIS — S32591D Other specified fracture of right pubis, subsequent encounter for fracture with routine healing: Secondary | ICD-10-CM

## 2018-08-25 DIAGNOSIS — Z8042 Family history of malignant neoplasm of prostate: Secondary | ICD-10-CM

## 2018-08-25 DIAGNOSIS — Z90710 Acquired absence of both cervix and uterus: Secondary | ICD-10-CM

## 2018-08-25 LAB — CBC WITH DIFFERENTIAL/PLATELET
Abs Immature Granulocytes: 0.08 10*3/uL — ABNORMAL HIGH (ref 0.00–0.07)
Basophils Absolute: 0.1 10*3/uL (ref 0.0–0.1)
Basophils Relative: 0 %
Eosinophils Absolute: 0.1 10*3/uL (ref 0.0–0.5)
Eosinophils Relative: 0 %
HCT: 45.4 % (ref 36.0–46.0)
Hemoglobin: 15.9 g/dL — ABNORMAL HIGH (ref 12.0–15.0)
Immature Granulocytes: 1 %
Lymphocytes Relative: 6 %
Lymphs Abs: 0.8 10*3/uL (ref 0.7–4.0)
MCH: 31 pg (ref 26.0–34.0)
MCHC: 35 g/dL (ref 30.0–36.0)
MCV: 88.5 fL (ref 80.0–100.0)
Monocytes Absolute: 0.9 10*3/uL (ref 0.1–1.0)
Monocytes Relative: 6 %
Neutro Abs: 12.8 10*3/uL — ABNORMAL HIGH (ref 1.7–7.7)
Neutrophils Relative %: 87 %
Platelets: 412 10*3/uL — ABNORMAL HIGH (ref 150–400)
RBC: 5.13 MIL/uL — ABNORMAL HIGH (ref 3.87–5.11)
RDW: 13.1 % (ref 11.5–15.5)
WBC: 14.7 10*3/uL — ABNORMAL HIGH (ref 4.0–10.5)
nRBC: 0 % (ref 0.0–0.2)

## 2018-08-25 LAB — CBG MONITORING, ED: Glucose-Capillary: 155 mg/dL — ABNORMAL HIGH (ref 70–99)

## 2018-08-25 MED ORDER — ONDANSETRON HCL 4 MG PO TABS: 4.0000 mg | ORAL_TABLET | Freq: Once | ORAL | Status: DC

## 2018-08-25 MED ORDER — SODIUM CHLORIDE 0.9% FLUSH: 3.0000 mL | Freq: Once | INTRAVENOUS | Status: DC

## 2018-08-25 MED ORDER — FENTANYL CITRATE (PF) 100 MCG/2ML IJ SOLN
25.0000 ug | Freq: Once | INTRAMUSCULAR | Status: AC
  Administered 2018-08-25: 25 ug via INTRAVENOUS
  Filled 2018-08-25: qty 2

## 2018-08-25 MED ORDER — ONDANSETRON HCL 4 MG/2ML IJ SOLN
4.0000 mg | Freq: Once | INTRAMUSCULAR | Status: AC
  Administered 2018-08-25: 4 mg via INTRAVENOUS
  Filled 2018-08-25: qty 2

## 2018-08-25 NOTE — ED Triage Notes (Signed)
Pt c/o nausea and diarrhea x one week with abdominal cramping

## 2018-08-26 ENCOUNTER — Encounter (HOSPITAL_COMMUNITY): Payer: Self-pay | Admitting: Internal Medicine

## 2018-08-26 ENCOUNTER — Other Ambulatory Visit: Payer: Self-pay

## 2018-08-26 ENCOUNTER — Observation Stay (HOSPITAL_COMMUNITY): Payer: PPO

## 2018-08-26 ENCOUNTER — Emergency Department (HOSPITAL_COMMUNITY): Payer: PPO

## 2018-08-26 DIAGNOSIS — R7989 Other specified abnormal findings of blood chemistry: Secondary | ICD-10-CM | POA: Diagnosis not present

## 2018-08-26 DIAGNOSIS — E871 Hypo-osmolality and hyponatremia: Principal | ICD-10-CM

## 2018-08-26 DIAGNOSIS — E876 Hypokalemia: Secondary | ICD-10-CM

## 2018-08-26 DIAGNOSIS — R0902 Hypoxemia: Secondary | ICD-10-CM

## 2018-08-26 DIAGNOSIS — I10 Essential (primary) hypertension: Secondary | ICD-10-CM

## 2018-08-26 DIAGNOSIS — R112 Nausea with vomiting, unspecified: Secondary | ICD-10-CM

## 2018-08-26 DIAGNOSIS — R197 Diarrhea, unspecified: Secondary | ICD-10-CM

## 2018-08-26 DIAGNOSIS — J449 Chronic obstructive pulmonary disease, unspecified: Secondary | ICD-10-CM

## 2018-08-26 DIAGNOSIS — R1084 Generalized abdominal pain: Secondary | ICD-10-CM

## 2018-08-26 LAB — COMPREHENSIVE METABOLIC PANEL
ALT: 32 U/L (ref 0–44)
AST: 24 U/L (ref 15–41)
Albumin: 4.2 g/dL (ref 3.5–5.0)
Alkaline Phosphatase: 86 U/L (ref 38–126)
Anion gap: 16 — ABNORMAL HIGH (ref 5–15)
BUN: 18 mg/dL (ref 8–23)
CO2: 27 mmol/L (ref 22–32)
Calcium: 7.6 mg/dL — ABNORMAL LOW (ref 8.9–10.3)
Chloride: 84 mmol/L — ABNORMAL LOW (ref 98–111)
Creatinine, Ser: 0.61 mg/dL (ref 0.44–1.00)
GFR calc Af Amer: >60 mL/min (ref >60)
GFR calc non Af Amer: >60 mL/min (ref >60)
Glucose, Bld: 136 mg/dL — ABNORMAL HIGH (ref 70–99)
Potassium: 2.9 mmol/L — ABNORMAL LOW (ref 3.5–5.1)
Sodium: 127 mmol/L — ABNORMAL LOW (ref 135–145)
Total Bilirubin: 0.8 mg/dL (ref 0.3–1.2)
Total Protein: 7 g/dL (ref 6.5–8.1)

## 2018-08-26 LAB — URINALYSIS, ROUTINE W REFLEX MICROSCOPIC
Bacteria, UA: NONE SEEN
Bilirubin Urine: NEGATIVE
Glucose, UA: NEGATIVE mg/dL
Hgb urine dipstick: NEGATIVE
Ketones, ur: NEGATIVE mg/dL
Leukocytes,Ua: NEGATIVE
Nitrite: NEGATIVE
Protein, ur: 100 mg/dL — AB
Specific Gravity, Urine: 1.03 (ref 1.005–1.030)
pH: 6 (ref 5.0–8.0)

## 2018-08-26 LAB — SODIUM, URINE, RANDOM: Sodium, Ur: 67 mmol/L

## 2018-08-26 LAB — BRAIN NATRIURETIC PEPTIDE: B Natriuretic Peptide: 114 pg/mL — ABNORMAL HIGH (ref 0.0–100.0)

## 2018-08-26 LAB — LIPID PANEL
Cholesterol: 167 mg/dL (ref 0–200)
HDL: 70 mg/dL (ref >40)
LDL Cholesterol: 86 mg/dL (ref 0–99)
Total CHOL/HDL Ratio: 2.4 RATIO
Triglycerides: 53 mg/dL (ref <150)
VLDL: 11 mg/dL (ref 0–40)

## 2018-08-26 LAB — OSMOLALITY: Osmolality: 271 mOsm/kg — ABNORMAL LOW (ref 275–295)

## 2018-08-26 LAB — D-DIMER, QUANTITATIVE: D-Dimer, Quant: 1.66 ug/mL-FEU — ABNORMAL HIGH (ref 0.00–0.50)

## 2018-08-26 LAB — LIPASE, BLOOD: Lipase: 34 U/L (ref 11–51)

## 2018-08-26 LAB — CORTISOL: Cortisol, Plasma: 20.5 ug/dL

## 2018-08-26 LAB — TSH: TSH: 3.271 u[IU]/mL (ref 0.350–4.500)

## 2018-08-26 LAB — TROPONIN I (HIGH SENSITIVITY): Troponin I (High Sensitivity): 15 ng/L (ref <18)

## 2018-08-26 LAB — OSMOLALITY, URINE: Osmolality, Ur: 366 mOsm/kg (ref 300–900)

## 2018-08-26 LAB — SARS CORONAVIRUS 2 BY RT PCR (HOSPITAL ORDER, PERFORMED IN ~~LOC~~ HOSPITAL LAB): SARS Coronavirus 2: NEGATIVE

## 2018-08-26 MED ORDER — PANTOPRAZOLE SODIUM 40 MG PO TBEC
40.0000 mg | DELAYED_RELEASE_TABLET | Freq: Every day | ORAL | Status: DC
  Administered 2018-08-26 – 2018-08-29 (×4): 40 mg via ORAL
  Filled 2018-08-26 (×4): qty 1

## 2018-08-26 MED ORDER — TECHNETIUM TC 99M DIETHYLENETRIAME-PENTAACETIC ACID
19.0000 | Freq: Once | INTRAVENOUS | Status: AC | PRN
  Administered 2018-08-26: 19 via INTRAVENOUS

## 2018-08-26 MED ORDER — FENTANYL CITRATE (PF) 100 MCG/2ML IJ SOLN
25.0000 ug | Freq: Once | INTRAMUSCULAR | Status: AC
  Administered 2018-08-26: 25 ug via INTRAVENOUS
  Filled 2018-08-26: qty 2

## 2018-08-26 MED ORDER — POTASSIUM CHLORIDE IN NACL 20-0.9 MEQ/L-% IV SOLN
INTRAVENOUS | Status: AC
  Administered 2018-08-26 – 2018-08-27 (×2): via INTRAVENOUS
  Filled 2018-08-26: qty 1000

## 2018-08-26 MED ORDER — TECHNETIUM TO 99M ALBUMIN AGGREGATED
1.7500 | Freq: Once | INTRAVENOUS | Status: AC | PRN
  Administered 2018-08-26: 1.75 via INTRAVENOUS

## 2018-08-26 MED ORDER — IOHEXOL 350 MG/ML SOLN
100.0000 mL | Freq: Once | INTRAVENOUS | Status: AC | PRN
  Administered 2018-08-26: 100 mL via INTRAVENOUS

## 2018-08-26 MED ORDER — ONDANSETRON HCL 4 MG/2ML IJ SOLN
4.0000 mg | Freq: Four times a day (QID) | INTRAMUSCULAR | Status: DC | PRN
  Administered 2018-08-26: 4 mg via INTRAVENOUS
  Filled 2018-08-26: qty 2

## 2018-08-26 MED ORDER — PROMETHAZINE HCL 25 MG/ML IJ SOLN
12.5000 mg | Freq: Once | INTRAMUSCULAR | Status: AC
  Administered 2018-08-26: 12.5 mg via INTRAVENOUS
  Filled 2018-08-26: qty 1

## 2018-08-26 MED ORDER — UMECLIDINIUM BROMIDE 62.5 MCG/INH IN AEPB
1.0000 | INHALATION_SPRAY | Freq: Every day | RESPIRATORY_TRACT | Status: DC
  Administered 2018-08-28 – 2018-08-29 (×2): 1 via RESPIRATORY_TRACT
  Filled 2018-08-26 (×2): qty 7

## 2018-08-26 MED ORDER — ACETAMINOPHEN 650 MG RE SUPP: 650.0000 mg | Freq: Four times a day (QID) | RECTAL | Status: DC | PRN

## 2018-08-26 MED ORDER — DILTIAZEM HCL ER COATED BEADS 240 MG PO CP24
240.0000 mg | ORAL_CAPSULE | Freq: Every day | ORAL | Status: DC
  Administered 2018-08-26 – 2018-08-29 (×4): 240 mg via ORAL
  Filled 2018-08-26 (×4): qty 1

## 2018-08-26 MED ORDER — SODIUM CHLORIDE 0.9 % IV SOLN: INTRAVENOUS | Status: DC

## 2018-08-26 MED ORDER — TRAMADOL HCL 50 MG PO TABS
50.0000 mg | ORAL_TABLET | Freq: Four times a day (QID) | ORAL | Status: DC | PRN
  Administered 2018-08-26: 50 mg via ORAL
  Filled 2018-08-26: qty 1

## 2018-08-26 MED ORDER — ACETAMINOPHEN 325 MG PO TABS
650.0000 mg | ORAL_TABLET | Freq: Four times a day (QID) | ORAL | Status: DC | PRN
  Administered 2018-08-26: 650 mg via ORAL
  Filled 2018-08-26: qty 2

## 2018-08-26 MED ORDER — DILTIAZEM HCL ER COATED BEADS 240 MG PO CP24
240.0000 mg | ORAL_CAPSULE | Freq: Every day | ORAL | Status: DC
  Filled 2018-08-26 (×3): qty 1

## 2018-08-26 MED ORDER — ASPIRIN EC 81 MG PO TBEC
81.0000 mg | DELAYED_RELEASE_TABLET | Freq: Every day | ORAL | Status: DC
  Administered 2018-08-26 – 2018-08-29 (×4): 81 mg via ORAL
  Filled 2018-08-26 (×4): qty 1

## 2018-08-26 MED ORDER — DULOXETINE HCL 30 MG PO CPEP
30.0000 mg | ORAL_CAPSULE | Freq: Every day | ORAL | Status: DC
  Administered 2018-08-26 – 2018-08-29 (×4): 30 mg via ORAL
  Filled 2018-08-26 (×4): qty 1

## 2018-08-26 MED ORDER — PROMETHAZINE HCL 25 MG/ML IJ SOLN: 12.5000 mg | Freq: Four times a day (QID) | INTRAMUSCULAR | Status: DC | PRN

## 2018-08-26 MED ORDER — DICYCLOMINE HCL 10 MG PO CAPS
20.0000 mg | ORAL_CAPSULE | Freq: Three times a day (TID) | ORAL | Status: DC
  Administered 2018-08-26 (×3): 20 mg via ORAL
  Filled 2018-08-26 (×3): qty 2

## 2018-08-26 MED ORDER — ENOXAPARIN SODIUM 40 MG/0.4ML ~~LOC~~ SOLN
40.0000 mg | SUBCUTANEOUS | Status: DC
  Administered 2018-08-27 – 2018-08-29 (×3): 40 mg via SUBCUTANEOUS
  Filled 2018-08-26 (×3): qty 0.4

## 2018-08-26 MED ORDER — DICYCLOMINE HCL 10 MG PO CAPS
10.0000 mg | ORAL_CAPSULE | Freq: Three times a day (TID) | ORAL | Status: DC
  Administered 2018-08-27 (×2): 10 mg via ORAL
  Filled 2018-08-26 (×4): qty 1

## 2018-08-26 MED ORDER — SODIUM CHLORIDE 0.9 % IV BOLUS
500.0000 mL | Freq: Once | INTRAVENOUS | Status: AC
  Administered 2018-08-26: 500 mL via INTRAVENOUS

## 2018-08-26 MED ORDER — POTASSIUM CHLORIDE 10 MEQ/100ML IV SOLN
10.0000 meq | INTRAVENOUS | Status: AC
  Administered 2018-08-26 (×3): 10 meq via INTRAVENOUS
  Filled 2018-08-26 (×2): qty 100

## 2018-08-26 MED ORDER — LEVALBUTEROL HCL 1.25 MG/0.5ML IN NEBU: 1.2500 mg | INHALATION_SOLUTION | Freq: Four times a day (QID) | RESPIRATORY_TRACT | Status: DC | PRN

## 2018-08-26 MED ORDER — DIPHENHYDRAMINE HCL 25 MG PO CAPS
25.0000 mg | ORAL_CAPSULE | Freq: Every day | ORAL | Status: DC
  Administered 2018-08-26 – 2018-08-28 (×3): 25 mg via ORAL
  Filled 2018-08-26 (×3): qty 1

## 2018-08-26 NOTE — ED Notes (Signed)
Pt up to bathroom and back to bed, via wheelchair; pt O2 sats continue to drop to lower 70's, O2 at 3 L [place] administered and O2 sats increase to mid 90's; Dr. Tomi Bamberger informed and orders given

## 2018-08-26 NOTE — ED Notes (Signed)
Have given pt a meal tray. Paused K in NS and K until patient is done eating

## 2018-08-26 NOTE — ED Notes (Signed)
Spoke with pt's son, Ronalee Belts, and gave an update; [employer] [phone]

## 2018-08-26 NOTE — ED Notes (Signed)
Pt assisted to bathroom via wheelchair and back to bed; pt O2 sats dropped to low 80's; O2 at 3L via [place] applied and O2 sats in the mid 90's

## 2018-08-26 NOTE — Clinical Social Work Note (Signed)
According to [facility] with [facility], patient is open to them for services.

## 2018-08-26 NOTE — ED Provider Notes (Signed)
[facility] EMERGENCY DEPARTMENT Provider Note   CSN: [id number] Arrival date & time: 08/25/18  1924     History   Chief Complaint Chief Complaint  Patient presents with  . Diarrhea    HPI [name] is a 83 y.o. female with a history significant for peripheral vascular disease specifically history of mesenteric ischemia with angioplasty of the celiac artery December 2019, other medical history including COPD, GERD, prior stroke, hypertension and arthritis presenting with a one-week history of generalized abdominal pain described as sharp and cramping and constant, worsened today in association with nausea without emesis and diarrhea which has been nonbloody, progressing to the point of severe diarrhea this evening, stating having a bowel movement today every 30 minutes.  However she has been in the department for several hours without a bowel movement.  She has taken Lomotil 2 doses today without improvement and has found no other alleviators.  Her symptoms remind her of her history of mesenteric ischemia.  She denies fevers or chills.  She was admitted here last month where she was treated for a community-acquired pneumonia and a pelvic fracture which is stable, discharged home with home health, and feeling improved with her breathing.  Denies shortness of breath or chest pain.  She has found no alleviators for her symptoms.    The history is provided by the patient.    Past Medical History:  Diagnosis Date  . Anxiety   . Arthritis   . Carotid artery disease ([place]) 2007   R CEA; dopplers 10/2016:  R ICA moderate (non-obstructive plaque) ; L ICA ~50-69%, but may underestimate. (reassess next with either  CTA versus formal cerebral angiogram)   . Constipation due to pain medication   . COPD (chronic obstructive pulmonary disease) ([place])   . Depression   . Diverticulosis   . Dyspnea   . Fibromyalgia   . Gastritis   . GERD (gastroesophageal reflux disease)   . Head injury   .  History of pneumonia   . History of stroke 76/1950   complication of R SubClav A PTA -- R hemispheric CVA  . Hypertension   . Hypothyroidism   . Osteoarthritis   . Peripheral vascular disease ([place])    Bilateral subclavian artery disease (status post R SubClav A PTA).  R side CEA (with US findings of Mod-Severe L CIA stenosis).  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   . Pneumonia   . Stroke ([place])   . Urgency incontinence     Patient Active Problem List   Diagnosis Date Noted  . Sepsis due to undetermined organism ([place]) 06/26/2018  . Acute respiratory failure with hypoxia ([place]) 06/26/2018  . Lobar pneumonia ([place]) 06/26/2018  . Closed fracture of multiple pubic rami, right, with delayed healing, subsequent encounter 06/26/2018  . Closed fracture of ramus of right pubis ([place])   . Acute colitis 12/02/2016  . Uncontrolled hypertension 12/02/2016  . Enteritis 12/02/2016  . Hyponatremia 12/02/2016  . Status post total shoulder arthroplasty, left 11/21/2016  . COPD (chronic obstructive pulmonary disease) ([place]) 11/21/2016  . Weakness 11/19/2016  . DJD of left shoulder 11/15/2016  . DOE (dyspnea on exertion) 11/11/2016  . Rotator cuff arthropathy of left shoulder 10/30/2016  . Intertrochanteric fracture of right hip ([place]) 08/01/2015    Class: Acute  . SMA stenosis ([place]) 09/15/2014  . Abdominal pain, right upper quadrant 09/15/2014  . Essential hypertension 09/15/2014  . Depression 09/15/2014  . Hypothyroid 09/15/2014  . GERD  without esophagitis 09/15/2014  . Lumbago 11/12/2013  . Stiffness of joint, not elsewhere classified, pelvic region and thigh 11/12/2013  . Muscle weakness (generalized) 11/12/2013  . Lack of coordination 03/21/2013  . Fine motor impairment 03/21/2013  . Left leg weakness 03/17/2013  . Risk for falls 03/17/2013  . Carotid artery disease ([place]) 03/11/2013  . Subclavian artery stenosis ([place]) 03/11/2013  . TIA (transient ischemic attack)  03/11/2013  . Tobacco use disorder 03/11/2013  . CVA (cerebral infarction) 03/05/2013  . Pain in joint, shoulder region 11/01/2010    Past Surgical History:  Procedure Laterality Date  . ANTERIOR AND POSTERIOR REPAIR  12/2000   Archie Endo 07/04/2010  . ANTERIOR CERVICAL DECOMP/DISCECTOMY FUSION N/A 04/03/2012   Procedure: ANTERIOR CERVICAL DECOMPRESSION/DISCECTOMY FUSION 2 LEVELS;  Surgeon: Hosie Spangle, MD;  Location: [place] [facility];  Service: Neurosurgery;  Laterality: N/A;  Cervical four-five,Cervical five-six anterior cervical decompression with fusion plating and bonegraft  . BACK SURGERY    . BREAST SURGERY Right 02/2007   Archie Endo 06/23/2010  . BUNIONECTOMY WITH HAMMERTOE RECONSTRUCTION Left 09/10/2007   Archie Endo 06/23/2010  . CAROTID ENDARTERECTOMY Right 2007   [facility], Dr. Lucky Cowboy  . CATARACT EXTRACTION W/ INTRAOCULAR LENS IMPLANT Left    Archie Endo 06/23/2010  . CHOLECYSTECTOMY    . COLONOSCOPY N/A 07/15/2014   Procedure: COLONOSCOPY;  Surgeon: Rogene Houston, MD;  Location[facility];  Service: Endoscopy;  Laterality: N/A;  200 - moved to 2:10 - Ann to notify pt  . COMPRESSION HIP SCREW Right 08/01/2015   Procedure: COMPRESSION HIP;  Surgeon: Carole Civil, MD;  Location: [facility];  Service: Orthopedics;  Laterality: Right;  . ESOPHAGOGASTRODUODENOSCOPY N/A 07/15/2014   Procedure: ESOPHAGOGASTRODUODENOSCOPY (EGD);  Surgeon: Rogene Houston, MD;  Location: [facility];  Service: Endoscopy;  Laterality: N/A;  . EYE SURGERY     cataract /lens both eyes  . FRACTURE SURGERY    . INCONTINENCE SURGERY  12/2000   Tension-free transvaginal tape procedure/notes 07/04/2010  . IR ANGIOGRAM SELECTIVE EACH ADDITIONAL VESSEL  01/21/2018  . IR ANGIOGRAM VISCERAL SELECTIVE  01/21/2018  . IR PTA NON CORO-LOWER EXTREM  01/21/2018  . IR RADIOLOGIST EVAL & MGMT  12/11/2017  . IR TRANSCATH PLC STENT 1ST ART NOT LE CV CAR VERT CAR  01/21/2018  . IR US GUIDE VASC ACCESS RIGHT  01/21/2018  . JOINT REPLACEMENT     . LUMBAR FUSION  08/2006   Archie Endo 06/23/2010  . PERIPHERAL VASCULAR BALLOON ANGIOPLASTY  02/2013   Aortic arch angiography revealed 70% innominate/R subclavian stenosis along with 60% left common carotid ostial stenosis and moderate left subclavian artery stenosis. ->  Innominate artery PTA with 7 mm balloon reducing stenosis to roughly 30%.  Marland Kitchen PERIPHERAL VASCULAR CATHETERIZATION N/A 10/08/2014   Procedure: Visceral Angiography;  Surgeon: Algernon Huxley, MD;  Location: [place] [facility];  Service: Cardiovascular;  - long stenosis/occlusion of SMA. 75% celiac artery (PTA with 6 mm balloon)  . PERIPHERAL VASCULAR CATHETERIZATION N/A 10/08/2014   Procedure: Visceral Artery Intervention;  Surgeon: Algernon Huxley, MD;  Location: [place] [facility];  Service: Cardiovascular;: 6 mm PTA balloon to stenosed CELIAC ARTERY  . ROTATOR CUFF REPAIR Left X 3  . TOTAL KNEE ARTHROPLASTY Bilateral 2005; 2011   right; left  . TOTAL SHOULDER ARTHROPLASTY Left 11/15/2016  . TOTAL SHOULDER ARTHROPLASTY Left 11/15/2016   Procedure: LEFT TOTAL SHOULDER ARTHROPLASTY;  Surgeon: [name], MD;  Location: [place] [facility];  Service: Orthopedics;  Laterality: Left;  .  TRANSTHORACIC ECHOCARDIOGRAM  10/2016   Normal LV wall thickness with LVEF 60-65% and grade 1 diastolic dysfunction.  Aortic valve calcific sclerosis with no stenosis.  Mildly calcified mitral annulus and leaflets.   Marland Kitchen VAGINAL HYSTERECTOMY  12/2000   Archie Endo 07/04/2010     OB History   No obstetric history on file.      Home Medications    Prior to Admission medications   Medication Sig Start Date End Date Taking? Authorizing Provider  Ascorbic Acid (VITAMIN C) 1000 MG tablet Take 1,000 mg by mouth 2 (two) times daily.     [provider]  aspirin EC 81 MG tablet Take 81 mg by mouth daily.    [provider]  Calcium Carb-Cholecalciferol (CALCIUM 600 + D PO) Take 1-2 tablets by mouth See admin instructions. 2 tabs in the morning, and 1 tab at  [facility]    [provider]  diltiazem (CARDIZEM CD) 240 MG 24 hr capsule TAKE 1 CAPSULE(240 MG) BY MOUTH DAILY 06/21/18   Leonie Man, MD  diphenhydrAMINE (BENADRYL) 25 mg capsule Take 25 mg by mouth at bedtime.    [provider]  DULoxetine (CYMBALTA) 30 MG capsule Take 30 mg by mouth daily.     [provider]  Flaxseed, Linseed, (FLAXSEED OIL) 1000 MG CAPS Take 1 capsule by mouth 2 (two) times daily.    [provider]  levofloxacin (LEVAQUIN) 500 MG tablet Take 1 tablet (500 mg total) by mouth daily at 6 PM. 07/02/18   Sinda Du, MD  Multiple Vitamin (MULTIVITAMIN WITH MINERALS) TABS Take 1 tablet by mouth daily.    [provider]  niacin 500 MG CR capsule Take 500 mg by mouth at bedtime.    [provider]  Omega-3 1000 MG CAPS Take 1 capsule by mouth daily.    [provider]  pantoprazole (PROTONIX) 40 MG tablet Take 1 tablet (40 mg total) by mouth daily. 05/27/18   Setzer, Rona Ravens, NP  traMADol (ULTRAM) 50 MG tablet Take 1 tablet (50 mg total) by mouth every 6 (six) hours as needed for moderate pain. 07/02/18   Sinda Du, MD    Family History Family History  Problem Relation Age of Onset  . Pancreatic cancer Mother   . Prostate cancer Father     Social History Social History   Tobacco Use  . Smoking status: Former Smoker    Packs/day: 0.12    Years: 63.00    Pack years: 7.56    Types: Cigarettes  . Smokeless tobacco: Never Used  Substance Use Topics  . Alcohol use: No    Alcohol/week: 0.0 standard drinks  . Drug use: No     Allergies   Aleve [naproxen sodium], Hydrocodone, Statins, Celebrex [celecoxib], Codeine, and Morphine and related   Review of Systems Review of Systems  Constitutional: Negative for chills and fever.  HENT: Negative for congestion and sore throat.   Eyes: Negative.   Respiratory: Negative for chest tightness and shortness of breath.   Cardiovascular: Negative for chest  pain.  Gastrointestinal: Positive for abdominal pain, diarrhea and nausea. Negative for vomiting.  Genitourinary: Negative.   Musculoskeletal: Negative for arthralgias, joint swelling and neck pain.  Skin: Negative.  Negative for rash and wound.  Neurological: Negative for dizziness, weakness, light-headedness, numbness and headaches.  Psychiatric/Behavioral: Negative.      Physical Exam Updated Vital Signs BP (!) 196/106 (BP Location: Right Arm)   Pulse 88   Temp 97.7 F (  36.5 C) (Oral)   Resp 18   SpO2 96%   Physical Exam Vitals signs and nursing note reviewed.  Constitutional:      Appearance: She is well-developed.  HENT:     Head: Normocephalic and atraumatic.  Eyes:     Conjunctiva/sclera: Conjunctivae normal.  Neck:     Musculoskeletal: Normal range of motion.  Cardiovascular:     Rate and Rhythm: Normal rate and regular rhythm.     Heart sounds: Normal heart sounds.  Pulmonary:     Effort: Pulmonary effort is normal.     Breath sounds: Normal breath sounds. No wheezing.  Abdominal:     General: Bowel sounds are decreased. There is no distension.     Palpations: Abdomen is soft. There is no mass.     Tenderness: There is generalized abdominal tenderness. There is guarding.     Comments: Generalized tenderness with guarding in her left upper and lower quadrants.  Increased tympany to percussion.  Bowel sounds are present but decreased.  Musculoskeletal: Normal range of motion.  Skin:    General: Skin is warm and dry.  Neurological:     Mental Status: She is alert.      ED Treatments / Results  Labs (all labs ordered are listed, but only abnormal results are displayed) Results for orders placed or performed during the hospital encounter of 08/25/18  Lipase, blood  Result Value Ref Range   Lipase 34 11 - 51 U/L  Comprehensive metabolic panel  Result Value Ref Range   Sodium 127 (L) 135 - 145 mmol/L   Potassium 2.9 (L) 3.5 - 5.1 mmol/L   Chloride 84 (L)  98 - 111 mmol/L   CO2 27 22 - 32 mmol/L   Glucose, Bld 136 (H) 70 - 99 mg/dL   BUN 18 8 - 23 mg/dL   Creatinine, Ser 0.61 0.44 - 1.00 mg/dL   Calcium 7.6 (L) 8.9 - 10.3 mg/dL   Total Protein 7.0 6.5 - 8.1 g/dL   Albumin 4.2 3.5 - 5.0 g/dL   AST 24 15 - 41 U/L   ALT 32 0 - 44 U/L   Alkaline Phosphatase 86 38 - 126 U/L   Total Bilirubin 0.8 0.3 - 1.2 mg/dL   GFR calc non Af Amer >60 >60 mL/min   GFR calc Af Amer >60 >60 mL/min   Anion gap 16 (H) 5 - 15  CBC with Differential  Result Value Ref Range   WBC 14.7 (H) 4.0 - 10.5 K/uL   RBC 5.13 (H) 3.87 - 5.11 MIL/uL   Hemoglobin 15.9 (H) 12.0 - 15.0 g/dL   HCT 45.4 36.0 - 46.0 %   MCV 88.5 80.0 - 100.0 fL   MCH 31.0 26.0 - 34.0 pg   MCHC 35.0 30.0 - 36.0 g/dL   RDW 13.1 11.5 - 15.5 %   Platelets 412 (H) 150 - 400 K/uL   nRBC 0.0 0.0 - 0.2 %   Neutrophils Relative % 87 %   Neutro Abs 12.8 (H) 1.7 - 7.7 K/uL   Lymphocytes Relative 6 %   Lymphs Abs 0.8 0.7 - 4.0 K/uL   Monocytes Relative 6 %   Monocytes Absolute 0.9 0.1 - 1.0 K/uL   Eosinophils Relative 0 %   Eosinophils Absolute 0.1 0.0 - 0.5 K/uL   Basophils Relative 0 %   Basophils Absolute 0.1 0.0 - 0.1 K/uL   Immature Granulocytes 1 %   Abs Immature Granulocytes 0.08 (H) 0.00 - 0.07 K/uL  CBG monitoring, ED  Result Value Ref Range   Glucose-Capillary 155 (H) 70 - 99 mg/dL   Comment 1 Notify RN    Comment 2 Document in Chart    No results found.   EKG None  Radiology No results found.  Procedures Procedures (including critical care time)  Medications Ordered in ED Medications  sodium chloride flush (NS) 0.9 % injection 3 mL (has no administration in time range)  fentaNYL (SUBLIMAZE) injection 25 mcg (has no administration in time range)  promethazine (PHENERGAN) injection 12.5 mg (has no administration in time range)  ondansetron (ZOFRAN) injection 4 mg (4 mg Intravenous Given 08/25/18 2341)  fentaNYL (SUBLIMAZE) injection 25 mcg (25 mcg Intravenous Given  08/25/18 2341)     Initial Impression / Assessment and Plan / ED Course  I have reviewed the triage vital signs and the nursing notes.  Pertinent labs & imaging results that were available during my care of the patient were reviewed by me and considered in my medical decision making (see chart for details).       Pt hx and exam concerning for exacerbation of her mesenteric ischemia. Also with recent abx use and frequent diarrhea, will assess for possible c dif.  Pending labs and CT imaging.    Discussed with Dr. Tomi Bamberger who will follow and dispo pt.   Final Clinical Impressions(s) / ED Diagnoses   Final diagnoses:  None    ED Discharge Orders    None       Landis Martins 08/26/18 0108    Rolland Porter, MD 08/26/18 [phone]

## 2018-08-26 NOTE — H&P (Addendum)
TRH H&P    Patient Demographics:    [name], is a 83 y.o. female  MRN: [id number]  DOB - 1936/01/23  Admit Date - 08/25/2018  Referring MD/NP/PA: Rolland Porter  Outpatient Primary MD for the patient is Sinda Du, MD  Patient coming from: home  Chief complaint-  hypoxia   HPI:    [name]  is a 83 y.o. female, w Copd, Anxiety/Depression, h/o mesenteric ischemia, and recent admission for sepsis, acute respiratory failure secondary to pneumonia on 07/02/2018 apparently presents with c/o nausea, abdominal cramping and diarrhea for the past 1 week.  Slight dry cough. Pt is not on home o2.    Pt notes that cramping is mostly epigastric in nature.  Pt denies cp, palp, sob, constipation, brbpr, dysuria, hematuria.     In ED,  T 97.7  P 88 R 18  Bp 196/106  Pox 96% on RA  CT abd/ pelvis IMPRESSION: VASCULAR  1. Stable chronic occlusion of the SMA. There is distal patency of the SMA. IMA appears diminutive but is grossly patent. Mesenteric vessels appear diffusely attenuated. 2. Extensive aortic atherosclerosis of the abdominal aorta and branch vessels without high-grade stenosis or acute occlusive disease.  NON-VASCULAR  1. Negative for bowel wall thickening or acute colitis. Diffuse fluid within the small and large bowel, possibly due to enteritis 2. Healing right inferior pubic ramus fracture 3. Sigmoid colon diverticular disease without acute inflammatory process  CXR IMPRESSION: No acute cardiopulmonary disease.  Wbc 14.7, Hgb 15.9, Plt 412 Na 127, K 2.9, Bun 18, Creatinine 0.61 Ast 24, Alt 32  Lipase 34  C. Diff pending  Covid 19 pending  Pt was given fentanyl 25 micrograms iv x1 and zofran 4mg  iv x1, and phenergan 12.5mg  iv x1 in the ED.   Afterwards when patient was being ambulated her o2 sat was low. 80's.    Pt will be admitted for hypoxia, ? Secondary to  fentanyl and hypokalemia, and hyponatremia.     Review of systems:    In addition to the HPI above,  No Fever-chills, No Headache, No changes with Vision or hearing, No problems swallowing food or Liquids, No Chest pain, Cough or Shortness of Breath, No Blood in stool or Urine, No dysuria, No new skin rashes or bruises, No new joints pains-aches,  No new weakness, tingling, numbness in any extremity, No recent weight gain or loss, No polyuria, polydypsia or polyphagia, No significant Mental Stressors.  All other systems reviewed and are negative.    Past History of the following :    Past Medical History:  Diagnosis Date  . Anxiety   . Arthritis   . Carotid artery disease ([place]) 2007   R CEA; dopplers 10/2016:  R ICA moderate (non-obstructive plaque) ; L ICA ~50-69%, but may underestimate. (reassess next with either  CTA versus formal cerebral angiogram)   . Constipation due to pain medication   . COPD (chronic obstructive pulmonary disease) ([place])   . Depression   . Diverticulosis   . Dyspnea   .  Fibromyalgia   . Gastritis   . GERD (gastroesophageal reflux disease)   . Head injury   . History of pneumonia   . History of stroke 35/3299   complication of R SubClav A PTA -- R hemispheric CVA  . Hypertension   . Hypothyroidism   . Osteoarthritis   . Peripheral vascular disease ([place])    Bilateral subclavian artery disease (status post R SubClav A PTA).  R side CEA (with US findings of Mod-Severe L CIA stenosis).  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   . Pneumonia   . Stroke ([place])   . Urgency incontinence       Past Surgical History:  Procedure Laterality Date  . ANTERIOR AND POSTERIOR REPAIR  12/2000   Archie Endo 07/04/2010  . ANTERIOR CERVICAL DECOMP/DISCECTOMY FUSION N/A 04/03/2012   Procedure: ANTERIOR CERVICAL DECOMPRESSION/DISCECTOMY FUSION 2 LEVELS;  Surgeon: Hosie Spangle, MD;  Location: [facility];  Service: Neurosurgery;  Laterality: N/A;   Cervical four-five,Cervical five-six anterior cervical decompression with fusion plating and bonegraft  . BACK SURGERY    . BREAST SURGERY Right 02/2007   Archie Endo 06/23/2010  . BUNIONECTOMY WITH HAMMERTOE RECONSTRUCTION Left 09/10/2007   Archie Endo 06/23/2010  . CAROTID ENDARTERECTOMY Right 2007   [facility], Dr. Lucky Cowboy  . CATARACT EXTRACTION W/ INTRAOCULAR LENS IMPLANT Left    Archie Endo 06/23/2010  . CHOLECYSTECTOMY    . COLONOSCOPY N/A 07/15/2014   Procedure: COLONOSCOPY;  Surgeon: Rogene Houston, MD;  [facility];  Service: Endoscopy;  Laterality: N/A;  200 - moved to 2:10 - Ann to notify pt  . COMPRESSION HIP SCREW Right 08/01/2015   Procedure: COMPRESSION HIP;  Surgeon: Carole Civil, MD;  Location: [facility];  Service: Orthopedics;  Laterality: Right;  . ESOPHAGOGASTRODUODENOSCOPY N/A 07/15/2014   Procedure: ESOPHAGOGASTRODUODENOSCOPY (EGD);  Surgeon: Rogene Houston, MD;  [facility];  Service: Endoscopy;  Laterality: N/A;  . EYE SURGERY     cataract /lens both eyes  . FRACTURE SURGERY    . INCONTINENCE SURGERY  12/2000   Tension-free transvaginal tape procedure/notes 07/04/2010  . IR ANGIOGRAM SELECTIVE EACH ADDITIONAL VESSEL  01/21/2018  . IR ANGIOGRAM VISCERAL SELECTIVE  01/21/2018  . IR PTA NON CORO-LOWER EXTREM  01/21/2018  . IR RADIOLOGIST EVAL & MGMT  12/11/2017  . IR TRANSCATH PLC STENT 1ST ART NOT LE CV CAR VERT CAR  01/21/2018  . IR US GUIDE VASC ACCESS RIGHT  01/21/2018  . JOINT REPLACEMENT    . LUMBAR FUSION  08/2006   Archie Endo 06/23/2010  . PERIPHERAL VASCULAR BALLOON ANGIOPLASTY  02/2013   Aortic arch angiography revealed 70% innominate/R subclavian stenosis along with 60% left common carotid ostial stenosis and moderate left subclavian artery stenosis. ->  Innominate artery PTA with 7 mm balloon reducing stenosis to roughly 30%.  Marland Kitchen PERIPHERAL VASCULAR CATHETERIZATION N/A 10/08/2014   Procedure: Visceral Angiography;  Surgeon: Algernon Huxley, MD;  Location: [facility];  Service: Cardiovascular;  - long stenosis/occlusion of SMA. 75% celiac artery (PTA with 6 mm balloon)  . PERIPHERAL VASCULAR CATHETERIZATION N/A 10/08/2014   Procedure: Visceral Artery Intervention;  Surgeon: Algernon Huxley, MD;  Location: [facility];  Service: Cardiovascular;: 6 mm PTA balloon to stenosed CELIAC ARTERY  . ROTATOR CUFF REPAIR Left X 3  . TOTAL KNEE ARTHROPLASTY Bilateral 2005; 2011   right; left  . TOTAL SHOULDER ARTHROPLASTY Left 11/15/2016  . TOTAL SHOULDER ARTHROPLASTY Left 11/15/2016  Procedure: LEFT TOTAL SHOULDER ARTHROPLASTY;  Surgeon: [name], MD;  Location: [facility];  Service: Orthopedics;  Laterality: Left;  . TRANSTHORACIC ECHOCARDIOGRAM  10/2016   Normal LV wall thickness with LVEF 60-65% and grade 1 diastolic dysfunction.  Aortic valve calcific sclerosis with no stenosis.  Mildly calcified mitral annulus and leaflets.   Marland Kitchen VAGINAL HYSTERECTOMY  12/2000   Archie Endo 07/04/2010      Social History:      Social History   Tobacco Use  . Smoking status: Former Smoker    Packs/day: 0.12    Years: 63.00    Pack years: 7.56    Types: Cigarettes  . Smokeless tobacco: Never Used  Substance Use Topics  . Alcohol use: No    Alcohol/week: 0.0 standard drinks       Family History :     Family History  Problem Relation Age of Onset  . Pancreatic cancer Mother   . Prostate cancer Father        Home Medications:   Prior to Admission medications   Medication Sig Start Date End Date Taking? Authorizing Provider  Ascorbic Acid (VITAMIN C) 1000 MG tablet Take 1,000 mg by mouth 2 (two) times daily.     [provider]  aspirin EC 81 MG tablet Take 81 mg by mouth daily.    [provider]  Calcium Carb-Cholecalciferol (CALCIUM 600 + D PO) Take 1-2 tablets by mouth See admin instructions. 2 tabs in the morning, and 1 tab at [facility]    [provider]  diltiazem (CARDIZEM CD) 240 MG 24 hr capsule TAKE 1 CAPSULE(240 MG) BY  MOUTH DAILY 06/21/18   Leonie Man, MD  diphenhydrAMINE (BENADRYL) 25 mg capsule Take 25 mg by mouth at bedtime.    [provider]  DULoxetine (CYMBALTA) 30 MG capsule Take 30 mg by mouth daily.     [provider]  Flaxseed, Linseed, (FLAXSEED OIL) 1000 MG CAPS Take 1 capsule by mouth 2 (two) times daily.    [provider]  levofloxacin (LEVAQUIN) 500 MG tablet Take 1 tablet (500 mg total) by mouth daily at 6 PM. 07/02/18   Sinda Du, MD  Multiple Vitamin (MULTIVITAMIN WITH MINERALS) TABS Take 1 tablet by mouth daily.    [provider]  niacin 500 MG CR capsule Take 500 mg by mouth at bedtime.    [provider]  Omega-3 1000 MG CAPS Take 1 capsule by mouth daily.    [provider]  pantoprazole (PROTONIX) 40 MG tablet Take 1 tablet (40 mg total) by mouth daily. 05/27/18   Setzer, Rona Ravens, NP  traMADol (ULTRAM) 50 MG tablet Take 1 tablet (50 mg total) by mouth every 6 (six) hours as needed for moderate pain. 07/02/18   Sinda Du, MD     Allergies:     Allergies  Allergen Reactions  . Aleve [Naproxen Sodium] Hives and Palpitations  . Hydrocodone Nausea Only  . Statins Other (See Comments)    Muscle pain  . Celebrex [Celecoxib] Nausea And Vomiting  . Codeine Nausea And Vomiting  . Morphine And Related Nausea And Vomiting     Physical Exam:   Vitals  Blood pressure (!) 179/93, pulse (!) 106, temperature 97.7 F (36.5 C), temperature source Oral, resp. rate 18, SpO2 97 %.  1.  General: aoxox3   2. Psychiatric: euthymic  3. Neurologic: Cn 2-12 intact reflexes 2+ symmetric, diffuse with no clonus, motor 5/5 in all 4 ext  4. HEENMT:  Anicteric, pupils 1.29mm symmetric, direct, consensual, intact Neck: no jvd,   5. Respiratory : Prolonged exp phase, no wheezing, no crackles.   6. Cardiovascular : rrr s1, s2, no m/g/r    7. Gastrointestinal:  Abd: soft, nt, nd, +bs  8. Skin:  Ext: no c/c/e,  No rash   9.Musculoskeletal:  Good ROM,  No adenoapthy    Data Review:    CBC Recent Labs  Lab 08/25/18 2227  WBC 14.7*  HGB 15.9*  HCT 45.4  PLT 412*  MCV 88.5  MCH 31.0  MCHC 35.0  RDW 13.1  LYMPHSABS 0.8  MONOABS 0.9  EOSABS 0.1  BASOSABS 0.1   ------------------------------------------------------------------------------------------------------------------  Results for orders placed or performed during the hospital encounter of 08/25/18 (from the past 48 hour(s))  CBG monitoring, ED     Status: Abnormal   Collection Time: 08/25/18 10:01 PM  Result Value Ref Range   Glucose-Capillary 155 (H) 70 - 99 mg/dL   Comment 1 Notify RN    Comment 2 Document in Chart   Lipase, blood     Status: None   Collection Time: 08/25/18 10:27 PM  Result Value Ref Range   Lipase 34 11 - 51 U/L    Comment: Performed at [facility], [address] [place], [place] [address]  Comprehensive metabolic panel     Status: Abnormal   Collection Time: 08/25/18 10:27 PM  Result Value Ref Range   Sodium 127 (L) 135 - 145 mmol/L   Potassium 2.9 (L) 3.5 - 5.1 mmol/L   Chloride 84 (L) 98 - 111 mmol/L   CO2 27 22 - 32 mmol/L   Glucose, Bld 136 (H) 70 - 99 mg/dL   BUN 18 8 - 23 mg/dL   Creatinine, Ser 0.61 0.44 - 1.00 mg/dL   Calcium 7.6 (L) 8.9 - 10.3 mg/dL   Total Protein 7.0 6.5 - 8.1 g/dL   Albumin 4.2 3.5 - 5.0 g/dL   AST 24 15 - 41 U/L   ALT 32 0 - 44 U/L   Alkaline Phosphatase 86 38 - 126 U/L   Total Bilirubin 0.8 0.3 - 1.2 mg/dL   GFR calc non Af Amer >60 >60 mL/min   GFR calc Af Amer >60 >60 mL/min   Anion gap 16 (H) 5 - 15    Comment: Performed at [facility], [address] [place], [place] [address]  CBC with Differential     Status: Abnormal   Collection Time: 08/25/18 10:27 PM  Result Value Ref Range   WBC 14.7 (H) 4.0 - 10.5 K/uL   RBC 5.13 (H) 3.87 - 5.11 MIL/uL   Hemoglobin 15.9 (H) 12.0 - 15.0 g/dL   HCT 45.4 36.0 - 46.0 %   MCV 88.5 80.0 - 100.0 fL   MCH 31.0 26.0 - 34.0 pg    MCHC 35.0 30.0 - 36.0 g/dL   RDW 13.1 11.5 - 15.5 %   Platelets 412 (H) 150 - 400 K/uL   nRBC 0.0 0.0 - 0.2 %   Neutrophils Relative % 87 %   Neutro Abs 12.8 (H) 1.7 - 7.7 K/uL   Lymphocytes Relative 6 %   Lymphs Abs 0.8 0.7 - 4.0 K/uL   Monocytes Relative 6 %   Monocytes Absolute 0.9 0.1 - 1.0 K/uL   Eosinophils Relative 0 %   Eosinophils Absolute 0.1 0.0 - 0.5 K/uL   Basophils Relative 0 %   Basophils Absolute 0.1 0.0 - 0.1 K/uL  Immature Granulocytes 1 %   Abs Immature Granulocytes 0.08 (H) 0.00 - 0.07 K/uL    Comment: Performed at [facility], [address], [place], [place] [address]    Chemistries  Recent Labs  Lab 08/25/18 2227  NA 127*  K 2.9*  CL 84*  CO2 27  GLUCOSE 136*  BUN 18  CREATININE 0.61  CALCIUM 7.6*  AST 24  ALT 32  ALKPHOS 86  BILITOT 0.8   ------------------------------------------------------------------------------------------------------------------  ------------------------------------------------------------------------------------------------------------------ GFR: CrCl cannot be calculated (Unknown ideal weight.). Liver Function Tests: Recent Labs  Lab 08/25/18 2227  AST 24  ALT 32  ALKPHOS 86  BILITOT 0.8  PROT 7.0  ALBUMIN 4.2   Recent Labs  Lab 08/25/18 2227  LIPASE 34   No results for input(s): AMMONIA in the last 168 hours. Coagulation Profile: No results for input(s): INR, PROTIME in the last 168 hours. Cardiac Enzymes: No results for input(s): CKTOTAL, CKMB, CKMBINDEX, TROPONINI in the last 168 hours. BNP (last 3 results) No results for input(s): PROBNP in the last 8760 hours. HbA1C: No results for input(s): HGBA1C in the last 72 hours. CBG: Recent Labs  Lab 08/25/18 2201  GLUCAP 155*   Lipid Profile: No results for input(s): CHOL, HDL, LDLCALC, TRIG, CHOLHDL, LDLDIRECT in the last 72 hours. Thyroid Function Tests: No results for input(s): TSH, T4TOTAL, FREET4, T3FREE, THYROIDAB in the last 72 hours.  Anemia Panel: No results for input(s): VITAMINB12, FOLATE, FERRITIN, TIBC, IRON, RETICCTPCT in the last 72 hours.  --------------------------------------------------------------------------------------------------------------- Urine analysis:    Component Value Date/Time   COLORURINE YELLOW 12/08/2016 1238   APPEARANCEUR HAZY (A) 12/08/2016 1238   LABSPEC 1.012 12/08/2016 1238   PHURINE 6.0 12/08/2016 1238   GLUCOSEU NEGATIVE 12/08/2016 1238   HGBUR NEGATIVE 12/08/2016 [place] 12/08/2016 1238   KETONESUR NEGATIVE 12/08/2016 1238   PROTEINUR 100 (A) 12/08/2016 1238   UROBILINOGEN 0.2 10/22/2014 0019   NITRITE NEGATIVE 12/08/2016 1238   LEUKOCYTESUR NEGATIVE 12/08/2016 1238      Imaging Results:    Dg Chest Portable 1 View  Result Date: 08/26/2018 CLINICAL DATA:  Hypoxia. Increased shortness of breath with exertion. EXAM: PORTABLE CHEST 1 VIEW COMPARISON:  06/30/2018 and 06/26/2018 FINDINGS: The heart size is normal. Pulmonary vascularity is at the upper limits of normal. No infiltrates or effusions. No acute bone abnormality. S old healed left rib fracture. Severe arthritic changes of the right shoulder. Left shoulder prosthesis. IMPRESSION: No acute cardiopulmonary disease. Electronically Signed   By: Lorriane Shire M.D.   On: 08/26/2018 05:21   Ct Angio Abd/pel W And/or Wo Contrast  Result Date: 08/26/2018 CLINICAL DATA:  Diarrhea concern for mesenteric ischemia EXAM: CTA ABDOMEN AND PELVIS wITHOUT AND WITH CONTRAST TECHNIQUE: Multidetector CT imaging of the abdomen and pelvis was performed using the standard protocol during bolus administration of intravenous contrast. Multiplanar reconstructed images and MIPs were obtained and reviewed to evaluate the vascular anatomy. CONTRAST:  184mL OMNIPAQUE IOHEXOL 350 MG/ML SOLN COMPARISON:  CT angiography 01/01/2018, CT 12/02/2016 FINDINGS: VASCULAR Aorta: Extensive aortic atherosclerosis with mild irregular luminal  narrowing of the proximal infrarenal abdominal aorta but no acute occlusion. No dissection. No aneurysm. Celiac: Calcification at the origin without significant stenosis. Branch vessels appear patent. 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 Diseased distal SMA but with flow enhancement present. Vessels appear diffusely diminutive. Renals: 2 right and single left renal arteries. Prominent calcification at origin of left renal artery  without significant stenosis. Calcification at the origins of both right renal arteries with suspected mild-to-moderate stenosis at the origins. IMA: Diminutive in size but with flow enhancement present. Inflow: Moderate to marked atherosclerosis of the bilateral common iliac and external iliac arteries. Moderately diseased internal iliac arteries. Proximal Outflow: Atherosclerotic calcifications.  No occlusions. Veins: No obvious venous abnormality within the limitations of this arterial phase study. Review of the MIP images confirms the above findings. NON-VASCULAR Lower chest: Lung bases demonstrate no acute consolidation or effusion. Hepatobiliary: No focal liver abnormality is seen. Status post cholecystectomy. No biliary dilatation. Pancreas: Unremarkable. No pancreatic ductal dilatation or surrounding inflammatory changes. Spleen: Normal in size without focal abnormality. Adrenals/Urinary Tract: Adrenal glands are unremarkable. Kidneys are normal, without renal calculi, focal lesion, or hydronephrosis. Bladder is unremarkable. Stomach/Bowel: Stomach is nonenlarged. Fluid-filled small and large bowel without bowel wall thickening. Sigmoid colon diverticula without acute inflammatory change. Lymphatic: No significantly enlarged lymph nodes Reproductive: Status post hysterectomy. No adnexal masses. Other: Negative for free air or free fluid Musculoskeletal: Subacute to chronic right inferior pubic ramus fracture.  Intramedullary rod within the right femur. Advanced degenerative changes of the spine. Chronic compression deformities T11-T12 and L1. IMPRESSION: VASCULAR 1. Stable chronic occlusion of the SMA. There is distal patency of the SMA. IMA appears diminutive but is grossly patent. Mesenteric vessels appear diffusely attenuated. 2. Extensive aortic atherosclerosis of the abdominal aorta and branch vessels without high-grade stenosis or acute occlusive disease. NON-VASCULAR 1. Negative for bowel wall thickening or acute colitis. Diffuse fluid within the small and large bowel, possibly due to enteritis 2. Healing right inferior pubic ramus fracture 3. Sigmoid colon diverticular disease without acute inflammatory process Electronically Signed   By: Donavan Foil M.D.   On: 08/26/2018 03:24       Assessment & Plan:    Principal Problem:   Hypoxia Active Problems:   Essential hypertension   COPD (chronic obstructive pulmonary disease) (HCC)   Hyponatremia   Hypokalemia  Hypoxia, unclear cause, ? Fentanyl, ? Copd,  Check d dimer, if positive may require VQ scan or CTA chest (probably can't do this til tomorrow) Monitor pox Try to ambulate later today w pulse oximeter  Hypokalemia Replete Check cmp in am  Hyponatremia Check urine sodium, urine osm Check serum osm, check tsh, check cortisol Hydrate with ns iv Check cmp in am  Nausea/ emesis zofran 4mg  iv q6h prn   Diarrhea C. Diff pending  Leukocytosis Improved from 06/27/2018 Urinalysis pending Check cbc in am  PVD, h/o carotid stenosis, h/o stroke Cont aspirin Check lipid in am Note intolerant to statins in the past  H/o mesenteric ischemia Cont aspirin  Hypertension Cont Cardizem CD 240mg  po qday  Gerd Cont PPI     DVT Prophylaxis-   Lovenox - SCDs   AM Labs Ordered, also please review Full Orders  Family Communication: Admission, patients condition and plan of care including tests being ordered have been discussed  with the patient  who indicate understanding and agree with the plan and Code Status.  Code Status:  DNR  Admission status: Observation/: Based on patients clinical presentation and evaluation of above clinical data, I have made determination that patient meets observation criteria at this time.   Time spent in minutes : 70   Jani Gravel M.D on 08/26/2018 at 6:05 AM

## 2018-08-26 NOTE — Care Management Obs Status (Signed)
[place] NOTIFICATION   Patient Details  Name: [name] MRN: [id number] Date of Birth: May 11, 1935   Medicare Observation Status Notification Given:  Yes    Tommy Medal 08/26/2018, 4:15 PM

## 2018-08-26 NOTE — Consult Note (Signed)
Referring Provider: Jani Gravel, MD  Primary Care Physician:  Sinda Du, MD Primary Gastroenterologist:  Dr. Laural Golden  Reason for Consultation:    Nausea vomiting abdominal pain and diarrhea.  HPI:   Patient is 83 year old Caucasian female who has history of chronic mesenteric ischemia who presents with 1 week history of nausea vomiting abdominal pain and diarrhea.  Patient reports no symptomatic improvement following angioplasty of celiac trunk back in December 2019.  [name] states [name] has 1 or more of the symptoms virtually every week.  Some weeks [name] will have pain and nausea another week [name] may have pain and diarrhea and on some days [name] would have all of these symptoms together.  For the last 1 week [name] has had almost daily nausea vomiting pain across upper mid abdomen and diarrhea.  [name] has not experienced hematemesis melena or rectal bleeding.  Pain is described as if something is expanding in her body and would explode.  [name] has not experienced fever or chills.  Her appetite is poor.  [name] says some of her meals just consist of a nutritional supplement.  [name] states [name] has lost 55 pounds over the last 5 or 6 years.  Review of her weight record reveals that [name] has not lost any weight since [name] was seen in the office in October 2019 presuming admission weight is correct. [name] is on low-dose aspirin.  He does not take other OTC NSAIDs. Following her angioplasty in December 2019 [name] was advised to take clopidogrel for at least 90 days.  Patient recalls bleeding from her shin after a fall at home and decided not to take clopidogrel anymore.  Patient is widowed.  [name] lives alone still drives. [name] has 3 sons who do not live in [place]. 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  [name] retired in 2006. [name] does not drink alcohol but [name] still smokes.  [name] says [name] smokes half a pack per week.  [name] has not been successful in quitting altogether. Mother died of pancreatic carcinoma at  age 30 and father had prostate cancer and lived to be in his 82s.    Past Medical History:  Diagnosis Date  . Anxiety   . Arthritis   . Carotid artery disease ([place]) 2007   R CEA; dopplers 10/2016:  R ICA moderate (non-obstructive plaque) ; L ICA ~50-69%, but may underestimate. (reassess next with either  CTA versus formal cerebral angiogram)   . Constipation due to pain medication   . COPD (chronic obstructive pulmonary disease) ([place])   . Depression   . Diverticulosis   . Dyspnea   . Fibromyalgia   . Gastritis   . GERD (gastroesophageal reflux disease)   . Head injury   . History of pneumonia   . History of stroke 18/5631   complication of R SubClav A PTA -- R hemispheric CVA  . Hypertension   . Hypothyroidism   . Osteoarthritis   . Peripheral vascular disease ([place])    Bilateral subclavian artery disease (status post R SubClav A PTA).  R side CEA (with US findings of Mod-Severe L CIA stenosis).  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   . Pneumonia   . Stroke ([place])   . Urgency incontinence     Past Surgical History:  Procedure Laterality Date  . ANTERIOR AND POSTERIOR REPAIR  12/2000   Archie Endo 07/04/2010  . ANTERIOR CERVICAL DECOMP/DISCECTOMY FUSION N/A 04/03/2012   Procedure: ANTERIOR CERVICAL DECOMPRESSION/DISCECTOMY FUSION 2  LEVELS;  Surgeon: Hosie Spangle, MD;  Location: [facility];  Service: Neurosurgery;  Laterality: N/A;  Cervical four-five,Cervical five-six anterior cervical decompression with fusion plating and bonegraft  . BACK SURGERY    . BREAST SURGERY Right 02/2007   Archie Endo 06/23/2010  . BUNIONECTOMY WITH HAMMERTOE RECONSTRUCTION Left 09/10/2007   Archie Endo 06/23/2010  . CAROTID ENDARTERECTOMY Right 2007   [facility], Dr. Lucky Cowboy  . CATARACT EXTRACTION W/ INTRAOCULAR LENS IMPLANT Left    Archie Endo 06/23/2010  . CHOLECYSTECTOMY    . COLONOSCOPY N/A 07/15/2014   Procedure: COLONOSCOPY;  Surgeon: Rogene Houston, MD;  [facility];  Service:  Endoscopy;  Laterality: N/A;  200 - moved to 2:10 - Ann to notify pt  . COMPRESSION HIP SCREW Right 08/01/2015   Procedure: COMPRESSION HIP;  Surgeon: Carole Civil, MD;  Location: [facility];  Service: Orthopedics;  Laterality: Right;  . ESOPHAGOGASTRODUODENOSCOPY N/A 07/15/2014   Procedure: ESOPHAGOGASTRODUODENOSCOPY (EGD);  Surgeon: Rogene Houston, MD;  [facility];  Service: Endoscopy;  Laterality: N/A;  . EYE SURGERY     cataract /lens both eyes  . FRACTURE SURGERY    . INCONTINENCE SURGERY  12/2000   Tension-free transvaginal tape procedure/notes 07/04/2010  . IR ANGIOGRAM SELECTIVE EACH ADDITIONAL VESSEL  01/21/2018  . IR ANGIOGRAM VISCERAL SELECTIVE  01/21/2018  . IR PTA NON CORO-LOWER EXTREM  01/21/2018  . IR RADIOLOGIST EVAL & MGMT  12/11/2017  . IR TRANSCATH PLC STENT 1ST ART NOT LE CV CAR VERT CAR  01/21/2018  . IR US GUIDE VASC ACCESS RIGHT  01/21/2018  . JOINT REPLACEMENT    . LUMBAR FUSION  08/2006   Archie Endo 06/23/2010  . PERIPHERAL VASCULAR BALLOON ANGIOPLASTY  02/2013   Aortic arch angiography revealed 70% innominate/R subclavian stenosis along with 60% left common carotid ostial stenosis and moderate left subclavian artery stenosis. ->  Innominate artery PTA with 7 mm balloon reducing stenosis to roughly 30%.  Marland Kitchen PERIPHERAL VASCULAR CATHETERIZATION N/A 10/08/2014   Procedure: Visceral Angiography;  Surgeon: Algernon Huxley, MD;  Location: [facility];  Service: Cardiovascular;  - long stenosis/occlusion of SMA. 75% celiac artery (PTA with 6 mm balloon)  . PERIPHERAL VASCULAR CATHETERIZATION N/A 10/08/2014   Procedure: Visceral Artery Intervention;  Surgeon: Algernon Huxley, MD;  Location: [facility];  Service: Cardiovascular;: 6 mm PTA balloon to stenosed CELIAC ARTERY  . ROTATOR CUFF REPAIR Left X 3  . TOTAL KNEE ARTHROPLASTY Bilateral 2005; 2011   right; left  . TOTAL SHOULDER ARTHROPLASTY Left 11/15/2016  . TOTAL SHOULDER ARTHROPLASTY Left 11/15/2016    Procedure: LEFT TOTAL SHOULDER ARTHROPLASTY;  Surgeon: [name], MD;  Location: [facility];  Service: Orthopedics;  Laterality: Left;  . TRANSTHORACIC ECHOCARDIOGRAM  10/2016   Normal LV wall thickness with LVEF 60-65% and grade 1 diastolic dysfunction.  Aortic valve calcific sclerosis with no stenosis.  Mildly calcified mitral annulus and leaflets.   Marland Kitchen VAGINAL HYSTERECTOMY  12/2000   Archie Endo 07/04/2010    Prior to Admission medications   Medication Sig Start Date End Date Taking? Authorizing Provider  Ascorbic Acid (VITAMIN C) 1000 MG tablet Take 1,000 mg by mouth 2 (two) times daily.    Yes [provider]  aspirin EC 81 MG tablet Take 81 mg by mouth daily.   Yes [provider]  Calcium Carb-Cholecalciferol (CALCIUM 600 + D PO) Take 1-2 tablets by mouth See admin instructions. 2 tabs in the morning, and 1  tab at [facility] [provider]  diltiazem (CARDIZEM CD) 240 MG 24 hr capsule TAKE 1 CAPSULE(240 MG) BY MOUTH DAILY 06/21/18  Yes Leonie Man, MD  diphenhydrAMINE (BENADRYL) 25 mg capsule Take 25 mg by mouth at bedtime.   Yes [provider]  DULoxetine (CYMBALTA) 30 MG capsule Take 30 mg by mouth daily.    Yes [provider]  Flaxseed, Linseed, (FLAXSEED OIL) 1000 MG CAPS Take 1 capsule by mouth 2 (two) times daily.   Yes [provider]  Multiple Vitamin (MULTIVITAMIN WITH MINERALS) TABS Take 1 tablet by mouth daily.   Yes [provider]  niacin 500 MG CR capsule Take 500 mg by mouth at bedtime.   Yes [provider]  Omega-3 1000 MG CAPS Take 1 capsule by mouth daily.   Yes [provider]  pantoprazole (PROTONIX) 40 MG tablet Take 1 tablet (40 mg total) by mouth daily. 05/27/18  Yes Setzer, Rona Ravens, NP  traMADol (ULTRAM) 50 MG tablet Take 1 tablet (50 mg total) by mouth every 6 (six) hours as needed for moderate pain. 07/02/18  Yes Sinda Du, MD  levofloxacin (LEVAQUIN) 500 MG tablet Take 1 tablet  (500 mg total) by mouth daily at 6 PM. Patient not taking: Reported on 08/26/2018 07/02/18   Sinda Du, MD    Current Facility-Administered Medications  Medication Dose Route Frequency Provider Last Rate Last Dose  . 0.9 % NaCl with KCl 20 mEq/ L  infusion   Intravenous Continuous Jani Gravel, MD 75 mL/hr at 08/26/18 0724    . acetaminophen (TYLENOL) tablet 650 mg  650 mg Oral Q6H PRN Jani Gravel, MD   650 mg at 08/26/18 0720   Or  . acetaminophen (TYLENOL) suppository 650 mg  650 mg Rectal Q6H PRN Jani Gravel, MD      . aspirin EC tablet 81 mg  81 mg Oral Daily Jani Gravel, MD   81 mg at 08/26/18 1440  . dicyclomine (BENTYL) capsule 20 mg  20 mg Oral TID AC Kathie Dike, MD   20 mg at 08/26/18 1720  . diltiazem (CARDIZEM CD) 24 hr capsule 240 mg  240 mg Oral Daily Jani Gravel, MD   240 mg at 08/26/18 1442  . diphenhydrAMINE (BENADRYL) capsule 25 mg  25 mg Oral QHS Jani Gravel, MD      . DULoxetine (CYMBALTA) DR capsule 30 mg  30 mg Oral Daily Jani Gravel, MD   30 mg at 08/26/18 1440  . enoxaparin (LOVENOX) injection 40 mg  40 mg Subcutaneous Q24H Jani Gravel, MD      . levalbuterol [facility]) nebulizer solution 1.25 mg  1.25 mg Nebulization Q6H PRN Jani Gravel, MD      . ondansetron [facility]) injection 4 mg  4 mg Intravenous Q6H PRN Jani Gravel, MD   4 mg at 08/26/18 1313  . pantoprazole (PROTONIX) EC tablet 40 mg  40 mg Oral Daily Jani Gravel, MD   40 mg at 08/26/18 1440  . promethazine (PHENERGAN) injection 12.5 mg  12.5 mg Intravenous Q6H PRN Kathie Dike, MD      . sodium chloride flush (NS) 0.9 % injection 3 mL  3 mL Intravenous Once Rolland Porter, MD      . traMADol Veatrice Bourbon) tablet 50 mg  50 mg Oral Q6H PRN Jani Gravel, MD   50 mg at 08/26/18 1441  . umeclidinium bromide (INCRUSE ELLIPTA) 62.5 MCG/INH 1 puff  1 puff Inhalation Daily Jani Gravel, MD  Allergies as of 08/25/2018 - Review Complete 08/25/2018  Allergen Reaction Noted  . Aleve [naproxen sodium] Hives and Palpitations  10/21/2010  . Hydrocodone Nausea Only 11/06/2016  . Statins Other (See Comments) 11/06/2016  . Celebrex [celecoxib] Nausea And Vomiting 10/21/2010  . Codeine Nausea And Vomiting 10/21/2010  . Morphine and related Nausea And Vomiting 10/21/2010    Family History  Problem Relation Age of Onset  . Pancreatic cancer Mother   . Prostate cancer Father     Social History   Socioeconomic History  . Marital status: Widowed    Spouse name: Not on file  . Number of children: 3  . Years of education: 42  . Highest education level: Not on file  Occupational History  . 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  Social Needs  . Financial resource strain: Not hard at all  . Food insecurity    Worry: Never true    Inability: Never true  . Transportation needs    Medical: No    Non-medical: No  Tobacco Use  . Smoking status: Former Smoker    Packs/day: 0.12    Years: 63.00    Pack years: 7.56    Types: Cigarettes  . Smokeless tobacco: Never Used  Substance and Sexual Activity  . Alcohol use: No    Alcohol/week: 0.0 standard drinks  . Drug use: No  . Sexual activity: Never    Birth control/protection: Abstinence  Lifestyle  . Physical activity    Days per week: 3 days    Minutes per session: Not on file  . Stress: To some extent  Relationships  . Social connections    Talks on phone: More than three times a week    Gets together: Not on file    Attends religious service: Not on file    Active member of club or organization: Not on file    Attends meetings of clubs or organizations: Not on file    Relationship status: Not on file  . Intimate partner violence    Fear of current or ex partner: No    Emotionally abused: No    Physically abused: No    Forced sexual activity: No  Other Topics Concern  . Not on file  Social History Narrative    [name] is widowed. [name] is the mother 63 grandchildren. [name] lives alone. 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 [name] exercises at the [facility] pole 45 at 60 minutes of time 3 days a week.    Review of Systems: See HPI, otherwise normal ROS  Physical Exam: Temp:  [97.7 F (36.5 C)] 97.7 F (36.5 C) (07/05 1934) Pulse Rate:  [48-106] 92 (07/06 1220) Resp:  [11-18] 17 (07/06 1220) BP: (159-212)/(69-106) 182/82 (07/06 1600) SpO2:  [96 %-99 %] 96 % (07/06 1220) Weight:  [47.6 kg] 47.6 kg (07/06 0822) Last BM Date: 08/25/18  Patient is alert and in no acute distress. [name] has generalized muscle vein wasting and appears to be chronically ill. Conjunctive is pink.  Sclerae nonicteric. Oral pharyngeal mucosa is somewhat dry.  [name] has complete upper and partial lower dental plates. No neck masses or thyromegaly noted. Cardiac exam with regular rhythm normal S1 and S2.  No murmur or gallop noted. Patient lungs reveal diminished intensity of breath sounds bilaterally.  No rales or rhonchi noted. Abdomen is symmetrical.  Bowel sounds are normal.  No  bruit noted.  On palpation [name] has soft abdomen that mild tenderness in periumbilical region as well as epigastrium.  No organomegaly or masses.  Lab Results: Recent Labs    08/25/18 2227  WBC 14.7*  HGB 15.9*  HCT 45.4  PLT 412*   BMET Recent Labs    08/25/18 2227  NA 127*  K 2.9*  CL 84*  CO2 27  GLUCOSE 136*  BUN 18  CREATININE 0.61  CALCIUM 7.6*   LFT Recent Labs    08/25/18 2227  PROT 7.0  ALBUMIN 4.2  AST 24  ALT 32  ALKPHOS 86  BILITOT 0.8    Studies/Results: Nm Pulmonary Perf And Vent  Result Date: 08/26/2018 CLINICAL DATA:  Nausea, elevated D-dimer, question pulmonary embolism, intermediate clinical probability for PE; history COPD, hypertension, stroke EXAM: NUCLEAR MEDICINE VENTILATION - PERFUSION LUNG SCAN TECHNIQUE: Ventilation images were obtained in multiple projections using inhaled aerosol Tc-63m DTPA. Perfusion images were obtained in multiple projections after intravenous  injection of Tc-69m MAA. RADIOPHARMACEUTICALS:  19 mCi of Tc-48m DTPA aerosol inhalation and 1.75 mCi Tc21m MAA IV COMPARISON:  None Correlation: Chest radiograph 08/26/2018 FINDINGS: Ventilation: Inhomogeneous peripheral ventilation throughout the mid to lower RIGHT lung. Subsegmental defects are identified at the lingula and RIGHT middle lobe. Perfusion: Matching perfusion defects at lingula and RIGHT middle lobe. Inhomogeneous perfusion peripherally in the RIGHT lung matching ventilatory findings. Chest radiograph: COPD changes with RIGHT basilar atelectasis and question minimal LEFT basilar atelectasis. IMPRESSION: Matching ventilation and perfusion abnormalities in the RIGHT middle lobe and lingula. Inhomogeneous ventilation and perfusion in the periphery of the RIGHT lung consistent with emphysematous changes and COPD seen on chest radiograph. Low probability for pulmonary embolism. Electronically Signed   By: Lavonia Dana M.D.   On: 08/26/2018 11:11   Dg Chest Portable 1 View  Result Date: 08/26/2018 CLINICAL DATA:  Hypoxia. Increased shortness of breath with exertion. EXAM: PORTABLE CHEST 1 VIEW COMPARISON:  06/30/2018 and 06/26/2018 FINDINGS: The heart size is normal. Pulmonary vascularity is at the upper limits of normal. No infiltrates or effusions. No acute bone abnormality. S old healed left rib fracture. Severe arthritic changes of the right shoulder. Left shoulder prosthesis. IMPRESSION: No acute cardiopulmonary disease. Electronically Signed   By: Lorriane Shire M.D.   On: 08/26/2018 05:21   Ct Angio Abd/pel W And/or Wo Contrast  Result Date: 08/26/2018 CLINICAL DATA:  Diarrhea concern for mesenteric ischemia EXAM: CTA ABDOMEN AND PELVIS wITHOUT AND WITH CONTRAST TECHNIQUE: Multidetector CT imaging of the abdomen and pelvis was performed using the standard protocol during bolus administration of intravenous contrast. Multiplanar reconstructed images and MIPs were obtained and reviewed to  evaluate the vascular anatomy. CONTRAST:  125mL OMNIPAQUE IOHEXOL 350 MG/ML SOLN COMPARISON:  CT angiography 01/01/2018, CT 12/02/2016 FINDINGS: VASCULAR Aorta: Extensive aortic atherosclerosis with mild irregular luminal narrowing of the proximal infrarenal abdominal aorta but no acute occlusion. No dissection. No aneurysm. Celiac: Calcification at the origin without significant stenosis. Branch vessels appear patent. 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 Diseased distal SMA but with flow enhancement present. Vessels appear diffusely diminutive. Renals: 2 right and single left renal arteries. Prominent calcification at origin of left renal artery without significant stenosis. Calcification at the origins of both right renal arteries with suspected mild-to-moderate stenosis at the origins. IMA: Diminutive in size but with flow enhancement present. Inflow: Moderate to marked atherosclerosis of the bilateral common iliac and external iliac arteries. Moderately  diseased internal iliac arteries. Proximal Outflow: Atherosclerotic calcifications.  No occlusions. Veins: No obvious venous abnormality within the limitations of this arterial phase study. Review of the MIP images confirms the above findings. NON-VASCULAR Lower chest: Lung bases demonstrate no acute consolidation or effusion. Hepatobiliary: No focal liver abnormality is seen. Status post cholecystectomy. No biliary dilatation. Pancreas: Unremarkable. No pancreatic ductal dilatation or surrounding inflammatory changes. Spleen: Normal in size without focal abnormality. Adrenals/Urinary Tract: Adrenal glands are unremarkable. Kidneys are normal, without renal calculi, focal lesion, or hydronephrosis. Bladder is unremarkable. Stomach/Bowel: Stomach is nonenlarged. Fluid-filled small and large bowel without bowel wall thickening. Sigmoid colon diverticula without acute inflammatory change.  Lymphatic: No significantly enlarged lymph nodes Reproductive: Status post hysterectomy. No adnexal masses. Other: Negative for free air or free fluid Musculoskeletal: Subacute to chronic right inferior pubic ramus fracture. Intramedullary rod within the right femur. Advanced degenerative changes of the spine. Chronic compression deformities T11-T12 and L1. IMPRESSION: VASCULAR 1. Stable chronic occlusion of the SMA. There is distal patency of the SMA. IMA appears diminutive but is grossly patent. Mesenteric vessels appear diffusely attenuated. 2. Extensive aortic atherosclerosis of the abdominal aorta and branch vessels without high-grade stenosis or acute occlusive disease. NON-VASCULAR 1. Negative for bowel wall thickening or acute colitis. Diffuse fluid within the small and large bowel, possibly due to enteritis 2. Healing right inferior pubic ramus fracture 3. Sigmoid colon diverticular disease without acute inflammatory process Electronically Signed   By: Donavan Foil M.D.   On: 08/26/2018 03:24   Have reviewed imaging studies including CT and chest film.  Assessment;  Patient is 83 year old Caucasian female with history of mesenteric ischemia with 2 interventions in the past(celiac trunk angioplasty August 2016;  [facility]; balloon angioplasty of celiac trunk origin 01/22/2018 by Dr. Peyton Najjar) who presents with worsening symptoms of nausea vomiting abdominal pain and diarrhea over the last 1 week.  Past history is significant for hospitalization in May 2020 for sepsis due to pneumonia. Suspect patient's symptoms are due to chronic mesenteric ischemia but enteric infection needs to be ruled out.  Patient reports no symptomatic improvement with last intervention of December 2019 and celiac trunk appears to be patent on current study.  He has chronic occlusion of SMA with retrograde flow.  Weight loss secondary to mesenteric ischemia.  [name] has not lost any weight since  [name] was last seen in the office in October 2019.  In the preceding 5 or 6 years [name] has lost over 50 pounds.  [name] possibly has underlying malabsorption as well as all intestinal bacterial overgrowth.  And p.o. intake also appears to be suboptimal.  Dyspnea appears to be due to chronic lung disease.  VQ scan low probability for pulmonary embolism.  Recommendations;  Diet as tolerated. Agree with low-dose dicyclomine but will have to watch closely for side effects. GI Pathogen panel if stool is still loose. Patient will be reevaluated in a.m.    LOS: 0 days   Ygnacio Fecteau  08/26/2018, 5:42 PM

## 2018-08-26 NOTE — Progress Notes (Signed)
Patient admitted to the hospital by Dr. Maudie Mercury earlier this morning  Patient seen and examined.  She continues to complain of diffuse abdominal pain.  She is not had any further vomiting or any bowel movement since admission.  Overall shortness of breath is better since admission.  83 year old female with history of chronic mesenteric ischemia, presents to the hospital with complaints of nausea, vomiting, abdominal pain and diarrhea.  She had a CT angiogram done in the emergency room that did not show any significant change from prior images and showed chronic occlusion of SMA.  She was initially having diarrhea and stool for C. difficile was ordered, but she has not had any further bowel movements.  Appreciate GI input.  Treating supportively at this time.  Advance diet as tolerated.  She was also noted to be hypoxic in the emergency room.  Chest x-ray did not show any acute findings.  She was noted to be tachycardic and d-dimer was elevated.  VQ scan was done was found to be a low probability study.  Question if her hypoxia was related to COPD versus a dose of pain medication she recently received.  We will try and wean off oxygen as tolerated.  [employer]

## 2018-08-26 NOTE — ED Notes (Signed)
Have updated son on pt's care

## 2018-08-27 DIAGNOSIS — E876 Hypokalemia: Secondary | ICD-10-CM | POA: Diagnosis present

## 2018-08-27 DIAGNOSIS — Z8673 Personal history of transient ischemic attack (TIA), and cerebral infarction without residual deficits: Secondary | ICD-10-CM | POA: Diagnosis not present

## 2018-08-27 DIAGNOSIS — J449 Chronic obstructive pulmonary disease, unspecified: Secondary | ICD-10-CM | POA: Diagnosis present

## 2018-08-27 DIAGNOSIS — E871 Hypo-osmolality and hyponatremia: Secondary | ICD-10-CM | POA: Diagnosis present

## 2018-08-27 DIAGNOSIS — Z20828 Contact with and (suspected) exposure to other viral communicable diseases: Secondary | ICD-10-CM | POA: Diagnosis present

## 2018-08-27 DIAGNOSIS — X58XXXD Exposure to other specified factors, subsequent encounter: Secondary | ICD-10-CM | POA: Diagnosis present

## 2018-08-27 DIAGNOSIS — Z981 Arthrodesis status: Secondary | ICD-10-CM | POA: Diagnosis not present

## 2018-08-27 DIAGNOSIS — M797 Fibromyalgia: Secondary | ICD-10-CM | POA: Diagnosis present

## 2018-08-27 DIAGNOSIS — Z9842 Cataract extraction status, left eye: Secondary | ICD-10-CM | POA: Diagnosis not present

## 2018-08-27 DIAGNOSIS — R112 Nausea with vomiting, unspecified: Secondary | ICD-10-CM | POA: Diagnosis not present

## 2018-08-27 DIAGNOSIS — Z888 Allergy status to other drugs, medicaments and biological substances status: Secondary | ICD-10-CM | POA: Diagnosis not present

## 2018-08-27 DIAGNOSIS — F329 Major depressive disorder, single episode, unspecified: Secondary | ICD-10-CM | POA: Diagnosis present

## 2018-08-27 DIAGNOSIS — K219 Gastro-esophageal reflux disease without esophagitis: Secondary | ICD-10-CM | POA: Diagnosis present

## 2018-08-27 DIAGNOSIS — Z9049 Acquired absence of other specified parts of digestive tract: Secondary | ICD-10-CM | POA: Diagnosis not present

## 2018-08-27 DIAGNOSIS — R1084 Generalized abdominal pain: Secondary | ICD-10-CM | POA: Diagnosis not present

## 2018-08-27 DIAGNOSIS — E440 Moderate protein-calorie malnutrition: Secondary | ICD-10-CM | POA: Diagnosis present

## 2018-08-27 DIAGNOSIS — M1990 Unspecified osteoarthritis, unspecified site: Secondary | ICD-10-CM | POA: Diagnosis present

## 2018-08-27 DIAGNOSIS — Z961 Presence of intraocular lens: Secondary | ICD-10-CM | POA: Diagnosis present

## 2018-08-27 DIAGNOSIS — K551 Chronic vascular disorders of intestine: Secondary | ICD-10-CM | POA: Diagnosis present

## 2018-08-27 DIAGNOSIS — Z885 Allergy status to narcotic agent status: Secondary | ICD-10-CM | POA: Diagnosis not present

## 2018-08-27 DIAGNOSIS — I739 Peripheral vascular disease, unspecified: Secondary | ICD-10-CM | POA: Diagnosis present

## 2018-08-27 DIAGNOSIS — Z9841 Cataract extraction status, right eye: Secondary | ICD-10-CM | POA: Diagnosis not present

## 2018-08-27 DIAGNOSIS — F419 Anxiety disorder, unspecified: Secondary | ICD-10-CM | POA: Diagnosis present

## 2018-08-27 DIAGNOSIS — E039 Hypothyroidism, unspecified: Secondary | ICD-10-CM | POA: Diagnosis present

## 2018-08-27 DIAGNOSIS — R197 Diarrhea, unspecified: Secondary | ICD-10-CM | POA: Diagnosis not present

## 2018-08-27 DIAGNOSIS — I10 Essential (primary) hypertension: Secondary | ICD-10-CM | POA: Diagnosis present

## 2018-08-27 DIAGNOSIS — R0902 Hypoxemia: Secondary | ICD-10-CM | POA: Diagnosis present

## 2018-08-27 LAB — GASTROINTESTINAL PANEL BY PCR, STOOL (REPLACES STOOL CULTURE)

## 2018-08-27 LAB — COMPREHENSIVE METABOLIC PANEL
ALT: 29 U/L (ref 0–44)
AST: 20 U/L (ref 15–41)
Albumin: 3.1 g/dL — ABNORMAL LOW (ref 3.5–5.0)
Alkaline Phosphatase: 63 U/L (ref 38–126)
Anion gap: 11 (ref 5–15)
BUN: 18 mg/dL (ref 8–23)
CO2: 25 mmol/L (ref 22–32)
Calcium: 6.5 mg/dL — ABNORMAL LOW (ref 8.9–10.3)
Chloride: 93 mmol/L — ABNORMAL LOW (ref 98–111)
Creatinine, Ser: 0.67 mg/dL (ref 0.44–1.00)
GFR calc Af Amer: >60 mL/min (ref >60)
GFR calc non Af Amer: >60 mL/min (ref >60)
Glucose, Bld: 132 mg/dL — ABNORMAL HIGH (ref 70–99)
Potassium: 3.4 mmol/L — ABNORMAL LOW (ref 3.5–5.1)
Sodium: 129 mmol/L — ABNORMAL LOW (ref 135–145)
Total Bilirubin: 1 mg/dL (ref 0.3–1.2)
Total Protein: 5.5 g/dL — ABNORMAL LOW (ref 6.5–8.1)

## 2018-08-27 LAB — C DIFFICILE QUICK SCREEN W PCR REFLEX
C Diff antigen: NEGATIVE
C Diff interpretation: NOT DETECTED
C Diff toxin: NEGATIVE

## 2018-08-27 LAB — CBC
HCT: 36.3 % (ref 36.0–46.0)
Hemoglobin: 12.3 g/dL (ref 12.0–15.0)
MCH: 30.8 pg (ref 26.0–34.0)
MCHC: 33.9 g/dL (ref 30.0–36.0)
MCV: 91 fL (ref 80.0–100.0)
Platelets: 343 10*3/uL (ref 150–400)
RBC: 3.99 MIL/uL (ref 3.87–5.11)
RDW: 13.1 % (ref 11.5–15.5)
WBC: 18 10*3/uL — ABNORMAL HIGH (ref 4.0–10.5)
nRBC: 0 % (ref 0.0–0.2)

## 2018-08-27 MED ORDER — BOOST / RESOURCE BREEZE PO LIQD CUSTOM
1.0000 | Freq: Three times a day (TID) | ORAL | Status: DC
  Administered 2018-08-27: 1 via ORAL

## 2018-08-27 MED ORDER — NICOTINE 7 MG/24HR TD PT24
7.0000 mg | MEDICATED_PATCH | Freq: Every day | TRANSDERMAL | Status: DC
  Administered 2018-08-28: 7 mg via TRANSDERMAL
  Filled 2018-08-27 (×2): qty 1

## 2018-08-27 MED ORDER — ADULT MULTIVITAMIN W/MINERALS CH
1.0000 | ORAL_TABLET | Freq: Every day | ORAL | Status: DC
  Administered 2018-08-27 – 2018-08-29 (×3): 1 via ORAL
  Filled 2018-08-27 (×3): qty 1

## 2018-08-27 MED ORDER — LOPERAMIDE HCL 2 MG PO CAPS
2.0000 mg | ORAL_CAPSULE | ORAL | Status: DC | PRN
  Administered 2018-08-27 – 2018-08-29 (×5): 2 mg via ORAL
  Filled 2018-08-27 (×6): qty 1

## 2018-08-27 MED ORDER — POTASSIUM CHLORIDE IN NACL 20-0.9 MEQ/L-% IV SOLN
INTRAVENOUS | Status: DC
  Administered 2018-08-27: 10:00:00 via INTRAVENOUS

## 2018-08-27 MED ORDER — ENSURE ENLIVE PO LIQD
237.0000 mL | Freq: Two times a day (BID) | ORAL | Status: DC
  Administered 2018-08-27: 237 mL via ORAL

## 2018-08-27 NOTE — Progress Notes (Signed)
Initial Nutrition Assessment  DOCUMENTATION CODES:   Non-severe (moderate) malnutrition in context of chronic illness   INTERVENTION:  D/c Ensure Enlive-   Boost Breeze po TID, each supplement provides 250 kcal and 9 grams of protein   Multivitamin and minerals daily due to likely chronic shortfall of essential nutrients  NUTRITION DIAGNOSIS:   Moderate Malnutrition related to acute illness, chronic illness(mesenteric ischemia) as evidenced by mild fat depletion, moderate fat depletion, mild muscle depletion, moderate muscle depletion.   GOAL: Pt to meet >/= 90% of their estimated nutrition needs      MONITOR: Po intake, labs and wt trends     REASON FOR ASSESSMENT:   Malnutrition Screening Tool    ASSESSMENT: Patient is an 83 yo female with acute on chronic mesenteric ischemia. Patient lives alone. History of COPD, GERD, Constipation, hypertension, fibromyalgia.  Meal Completion: 50% intake. Home diet is regular. Denies chewing problems. Patient usually eats a sweet snack for breakfast with her medicine and at 11:30 has bowl of soup. Dinner at ~ 4:30 pm is her main meal of the day. She doesn't drink Ensure or Glucerna "they hurt my stomach".   Weight history stable between 46-49 kg range the past 1.5 years. Expect chronic suboptimal nutrition intake based on her diet history.    Labs: BMP Latest Ref Rng & Units 08/27/2018 08/25/2018 06/27/2018  Glucose 70 - 99 mg/dL 132(H) 136(H) 115(H)  BUN 8 - 23 mg/dL 18 18 22   Creatinine 0.44 - 1.00 mg/dL 0.67 0.61 0.57  Sodium 135 - 145 mmol/L 129(L) 127(L) 132(L)  Potassium 3.5 - 5.1 mmol/L 3.4(L) 2.9(L) 3.5  Chloride 98 - 111 mmol/L 93(L) 84(L) 97(L)  CO2 22 - 32 mmol/L 25 27 26   Calcium 8.9 - 10.3 mg/dL 6.5(L) 7.6(L) 6.6(L)     NUTRITION - FOCUSED PHYSICAL EXAM:    Most Recent Value  Orbital Region  Moderate depletion  Upper Arm Region  Mild depletion  Thoracic and Lumbar Region  Moderate depletion  Temple Region  Mild  depletion  Clavicle Bone Region  Moderate depletion  Clavicle and Acromion Bone Region  Moderate depletion  Scapular Bone Region  Unable to assess  Dorsal Hand  Moderate depletion  Hair  Reviewed  Eyes  Reviewed  Skin  Reviewed      Diet Order:   Diet Order            DIET SOFT Room service appropriate? Yes; Fluid consistency: Thin  Diet effective now              EDUCATION NEEDS:   Education needs have been addressed Skin:  Skin Assessment: Reviewed RN Assessment  Last BM:  7/7 type 6  Height:   Ht Readings from Last 1 Encounters:  06/26/18 5' 1.5" (1.562 m)    Weight:   Wt Readings from Last 1 Encounters:  08/27/18 48.6 kg    Ideal Body Weight:  50 kg  BMI:  Body mass index is 19.92 kg/m.  Estimated Nutritional Needs:   Kcal:  1470-1555 (30-32 kcal/kg/bw)  Protein:  72-82 (1.5-1.7 gr/kg/bw)  Fluid:  >1200 ml daily  Colman Cater MS,RD,CSG,LDN Office: [phone] Pager: [phone]

## 2018-08-27 NOTE — Progress Notes (Signed)
Subjective:  Patient states she is feeling better.  She says pain severity has decreased to the point that she can tolerate it.  She says while she has generalized pain it is more pronounced in epigastric region.  She has not had any nausea or vomiting since she was last seen.  She has had 2 bowel movements last night.  She states that second bowel movement was loose.  Sample was obtained for studies.  Objective: Blood pressure (!) 135/54, pulse 66, temperature 99.7 F (37.6 C), temperature source Oral, resp. rate 14, weight 48.6 kg, SpO2 93 %. Patient is alert and appears to be comfortable. Conjunctiva is pink. Sclera is nonicteric Oropharyngeal mucosa is normal. No neck masses or thyromegaly noted. Cardiac exam with regular rhythm normal S1 and S2. No murmur or gallop noted. Lungs are clear to auscultation. Abdomen appears somewhat less distended yesterday.  Bowel sounds are normal.  On palpation abdomen is soft.  She has mild diffuse tenderness which is more pronounced in periumbilical and epigastric region.  No organomegaly or masses. Extremities are thin but no clubbing or edema noted.  Labs/studies Results:  CBC Latest Ref Rng & Units 08/27/2018 08/25/2018 06/27/2018  WBC 4.0 - 10.5 K/uL 18.0(H) 14.7(H) 18.5(H)  Hemoglobin 12.0 - 15.0 g/dL 12.3 15.9(H) 11.4(L)  Hematocrit 36.0 - 46.0 % 36.3 45.4 34.8(L)  Platelets 150 - 400 K/uL 343 412(H) 476(H)    CMP Latest Ref Rng & Units 08/27/2018 08/25/2018 06/27/2018  Glucose 70 - 99 mg/dL 132(H) 136(H) 115(H)  BUN 8 - 23 mg/dL '18 18 22  '$ Creatinine 0.44 - 1.00 mg/dL 0.67 0.61 0.57  Sodium 135 - 145 mmol/L 129(L) 127(L) 132(L)  Potassium 3.5 - 5.1 mmol/L 3.4(L) 2.9(L) 3.5  Chloride 98 - 111 mmol/L 93(L) 84(L) 97(L)  CO2 22 - 32 mmol/L '25 27 26  '$ Calcium 8.9 - 10.3 mg/dL 6.5(L) 7.6(L) 6.6(L)  Total Protein 6.5 - 8.1 g/dL 5.5(L) 7.0 -  Total Bilirubin 0.3 - 1.2 mg/dL 1.0 0.8 -  Alkaline Phos 38 - 126 U/L 63 86 -  AST 15 - 41 U/L 20 24 -  ALT 0 -  44 U/L 29 32 -    Hepatic Function Latest Ref Rng & Units 08/27/2018 08/25/2018 06/26/2018  Total Protein 6.5 - 8.1 g/dL 5.5(L) 7.0 7.5  Albumin 3.5 - 5.0 g/dL 3.1(L) 4.2 4.2  AST 15 - 41 U/L '20 24 27  '$ ALT 0 - 44 U/L 29 32 37  Alk Phosphatase 38 - 126 U/L 63 86 108  Total Bilirubin 0.3 - 1.2 mg/dL 1.0 0.8 1.4(H)      Assessment:  #1.  Nausea vomiting acute on chronic abdominal pain and diarrhea and patient with known chronic mesenteric ischemia.  CT revealed no new findings other than dilated fluid-filled loops of small bowel.  She certainly could have an infection on top of her chronic mesenteric ischemia.  She does have leukocytosis.  She is afebrile.  Pathogen panel is pending. Patient needs to make another attempt to quit cigarette smoking as it might help her symptoms.  #2.  Electrolyte abnormalities secondary to vomiting and diarrhea.  Both hypokalemia and hyponatremia are improving with therapy.  #3.  COPD.  #4.  Weight loss secondary to chronic mesenteric ischemia.  She has diminished calorie intake and she may also have an element of malabsorption and even small bowel bacterial overgrowth.  She has maintained her weight since her October 2019 visit.   Recommendations:  Await for results of GI pathogen  panel. Continue low-dose dicyclomine. CBC and metabolic 7 in a.m.

## 2018-08-27 NOTE — Progress Notes (Signed)
Subjective: She was admitted yesterday with abdominal pain.  She says she has not had any vomiting and she did have a small bowel movement.  Her breathing is doing okay.  She still has pain but she says she wants to try to eat something.  She is known to have chronic mesenteric ischemia and I think that is an ongoing issue.  GI pathogen panel is pending  Objective: Vital signs in last 24 hours: Temp:  [99.7 F (37.6 C)] 99.7 F (37.6 C) (07/06 2123) Pulse Rate:  [66-100] 66 (07/07 0528) Resp:  [12-17] 14 (07/07 0528) BP: (135-211)/(54-101) 135/54 (07/07 0528) SpO2:  [93 %-99 %] 93 % (07/07 0528) Weight:  [47.6 kg-48.6 kg] 48.6 kg (07/07 0528) Weight change:  Last BM Date: 08/27/18  Intake/Output from previous day: No intake/output data recorded.  PHYSICAL EXAM General appearance: alert, cooperative and mild distress Resp: clear to auscultation bilaterally Cardio: regular rate and rhythm, S1, S2 normal, no murmur, click, rub or gallop GI: Mildly diffusely tender Extremities: extremities normal, atraumatic, no cyanosis or edema  Lab Results:  Results for orders placed or performed during the hospital encounter of 08/25/18 (from the past 48 hour(s))  CBG monitoring, ED     Status: Abnormal   Collection Time: 08/25/18 10:01 PM  Result Value Ref Range   Glucose-Capillary 155 (H) 70 - 99 mg/dL   Comment 1 Notify RN    Comment 2 Document in Chart   Lipase, blood     Status: None   Collection Time: 08/25/18 10:27 PM  Result Value Ref Range   Lipase 34 11 - 51 U/L    Comment: Performed at [facility], [address], [place], [place] [address]  Comprehensive metabolic panel     Status: Abnormal   Collection Time: 08/25/18 10:27 PM  Result Value Ref Range   Sodium 127 (L) 135 - 145 mmol/L   Potassium 2.9 (L) 3.5 - 5.1 mmol/L   Chloride 84 (L) 98 - 111 mmol/L   CO2 27 22 - 32 mmol/L   Glucose, Bld 136 (H) 70 - 99 mg/dL   BUN 18 8 - 23 mg/dL   Creatinine, Ser 0.61 0.44 - 1.00  mg/dL   Calcium 7.6 (L) 8.9 - 10.3 mg/dL   Total Protein 7.0 6.5 - 8.1 g/dL   Albumin 4.2 3.5 - 5.0 g/dL   AST 24 15 - 41 U/L   ALT 32 0 - 44 U/L   Alkaline Phosphatase 86 38 - 126 U/L   Total Bilirubin 0.8 0.3 - 1.2 mg/dL   GFR calc non Af Amer >60 >60 mL/min   GFR calc Af Amer >60 >60 mL/min   Anion gap 16 (H) 5 - 15    Comment: Performed at [facility], [address] [place], [place] [address]  CBC with Differential     Status: Abnormal   Collection Time: 08/25/18 10:27 PM  Result Value Ref Range   WBC 14.7 (H) 4.0 - 10.5 K/uL   RBC 5.13 (H) 3.87 - 5.11 MIL/uL   Hemoglobin 15.9 (H) 12.0 - 15.0 g/dL   HCT 45.4 36.0 - 46.0 %   MCV 88.5 80.0 - 100.0 fL   MCH 31.0 26.0 - 34.0 pg   MCHC 35.0 30.0 - 36.0 g/dL   RDW 13.1 11.5 - 15.5 %   Platelets 412 (H) 150 - 400 K/uL   nRBC 0.0 0.0 - 0.2 %   Neutrophils Relative % 87 %   Neutro Abs 12.8 (H)  1.7 - 7.7 K/uL   Lymphocytes Relative 6 %   Lymphs Abs 0.8 0.7 - 4.0 K/uL   Monocytes Relative 6 %   Monocytes Absolute 0.9 0.1 - 1.0 K/uL   Eosinophils Relative 0 %   Eosinophils Absolute 0.1 0.0 - 0.5 K/uL   Basophils Relative 0 %   Basophils Absolute 0.1 0.0 - 0.1 K/uL   Immature Granulocytes 1 %   Abs Immature Granulocytes 0.08 (H) 0.00 - 0.07 K/uL    Comment: Performed at [facility], [address], [place], [place] [address]  SARS Coronavirus 2 ([place] - Performed in [facility] hospital lab), Hosp Order     Status: None   Collection Time: 08/26/18  5:53 AM   Specimen: Nasopharyngeal Swab  Result Value Ref Range   SARS Coronavirus 2 NEGATIVE NEGATIVE    Comment: (NOTE) If result is NEGATIVE SARS-CoV-2 target nucleic acids are NOT DETECTED. The SARS-CoV-2 RNA is generally detectable in upper and lower  respiratory specimens during the acute phase of infection. The lowest  concentration of SARS-CoV-2 viral copies this assay can detect is 250  copies / mL. A negative result does not preclude SARS-CoV-2 infection  and should  not be used as the sole basis for treatment or other  patient management decisions.  A negative result may occur with  improper specimen collection / handling, submission of specimen other  than nasopharyngeal swab, presence of viral mutation(s) within the  areas targeted by this assay, and inadequate number of viral copies  (<250 copies / mL). A negative result must be combined with clinical  observations, patient history, and epidemiological information. If result is POSITIVE SARS-CoV-2 target nucleic acids are DETECTED. The SARS-CoV-2 RNA is generally detectable in upper and lower  respiratory specimens dur ing the acute phase of infection.  Positive  results are indicative of active infection with SARS-CoV-2.  Clinical  correlation with patient history and other diagnostic information is  necessary to determine patient infection status.  Positive results do  not rule out bacterial infection or co-infection with other viruses. If result is PRESUMPTIVE POSTIVE SARS-CoV-2 nucleic acids MAY BE PRESENT.   A presumptive positive result was obtained on the submitted specimen  and confirmed on repeat testing.  While 2019 novel coronavirus  (SARS-CoV-2) nucleic acids may be present in the submitted sample  additional confirmatory testing may be necessary for epidemiological  and / or clinical management purposes  to differentiate between  SARS-CoV-2 and other Sarbecovirus currently known to infect humans.  If clinically indicated additional testing with an alternate test  methodology [phone]) is advised. The SARS-CoV-2 RNA is generally  detectable in upper and lower respiratory sp ecimens during the acute  phase of infection. The expected result is Negative. Fact Sheet for Patients:  StrictlyIdeas.no Fact Sheet for Healthcare Providers: BankingDealers.co.za This test is not yet approved or cleared by the [place] FDA and has been  authorized for detection and/or diagnosis of SARS-CoV-2 by FDA under an Emergency Use Authorization (EUA).  This EUA will remain in effect (meaning this test can be used) for the duration of the COVID-19 declaration under Section 564(b)(1) of the Act, 21 U.S.C. section 360bbb-3(b)(1), unless the authorization is terminated or revoked sooner. Performed at [facility], [address], [place],  [address]   Brain natriuretic peptide     Status: Abnormal   Collection Time: 08/26/18  6:23 AM  Result Value Ref Range   B Natriuretic Peptide 114.0 (H) 0.0 - 100.0 pg/mL  Comment: Performed at [facility], [address] [place], [place] [address]  Osmolality     Status: Abnormal   Collection Time: 08/26/18  7:01 AM  Result Value Ref Range   Osmolality 271 (L) 275 - 295 mOsm/kg    Comment: Performed at [facility], [place] [address], [place], [place] [address]  Cortisol     Status: None   Collection Time: 08/26/18  7:01 AM  Result Value Ref Range   Cortisol, Plasma 20.5 ug/dL    Comment: (NOTE) AM    6.7 - 22.6 ug/dL PM   <10.0       ug/dL Performed at [facility] [address], [place], [place] [address]   TSH     Status: None   Collection Time: 08/26/18  7:01 AM  Result Value Ref Range   TSH 3.271 0.350 - 4.500 uIU/mL    Comment: Performed by a 3rd Generation assay with a functional sensitivity of <=0.01 uIU/mL. Performed at [facility], [address] [place], [place] [address]   D-dimer, quantitative (not at [facility])     Status: Abnormal   Collection Time: 08/26/18  7:01 AM  Result Value Ref Range   D-Dimer, Quant 1.66 (H) 0.00 - 0.50 ug/mL-FEU    Comment: (NOTE) At the manufacturer cut-off of 0.50 ug/mL FEU, this assay has been documented to exclude PE with a sensitivity and negative predictive value of 97 to 99%.  At this time, this assay has not been approved by the FDA to exclude DVT/VTE. Results should be correlated with clinical  presentation. Performed at [facility], [address], [place], [place] [address]   Lipid panel     Status: None   Collection Time: 08/26/18  7:01 AM  Result Value Ref Range   Cholesterol 167 0 - 200 mg/dL   Triglycerides 53 <150 mg/dL   HDL 70 >40 mg/dL   Total CHOL/HDL Ratio 2.4 RATIO   VLDL 11 0 - 40 mg/dL   LDL Cholesterol 86 0 - 99 mg/dL    Comment:        Total Cholesterol/HDL:CHD Risk Coronary Heart Disease Risk Table                     Men   Women  1/2 Average Risk   3.4   3.3  Average Risk       5.0   4.4  2 X Average Risk   9.6   7.1  3 X Average Risk  23.4   11.0        Use the calculated Patient Ratio above and the CHD Risk Table to determine the patient's CHD Risk.        ATP III CLASSIFICATION (LDL):  <100     mg/dL   Optimal  100-129  mg/dL   Near or Above                    Optimal  130-159  mg/dL   Borderline  160-189  mg/dL   High  >190     mg/dL   Very High Performed at [facility], [address], [place], [place] [address]   Troponin I (High Sensitivity)     Status: None   Collection Time: 08/26/18  7:01 AM  Result Value Ref Range   Troponin I (High Sensitivity) 15.00 <18 ng/L    Comment: (NOTE) Elevated high sensitivity troponin I (hsTnI) values and significant  changes across serial  measurements may suggest ACS but many other  chronic and acute conditions are known to elevate hsTnI results.  Refer to the "Links" section for chest pain algorithms and additional  guidance. Performed at [facility], [address], [place], [place] [address]   Urinalysis, Routine w reflex microscopic     Status: Abnormal   Collection Time: 08/26/18  8:33 AM  Result Value Ref Range   Color, Urine YELLOW YELLOW   APPearance CLEAR CLEAR   Specific Gravity, Urine 1.030 1.005 - 1.030   pH 6.0 5.0 - 8.0   Glucose, UA NEGATIVE NEGATIVE mg/dL   Hgb urine dipstick NEGATIVE NEGATIVE   Bilirubin Urine NEGATIVE NEGATIVE   Ketones, ur NEGATIVE NEGATIVE mg/dL    Protein, ur 100 (A) NEGATIVE mg/dL   Nitrite NEGATIVE NEGATIVE   Leukocytes,Ua NEGATIVE NEGATIVE   RBC / HPF 0-5 0 - 5 RBC/hpf   WBC, UA 0-5 0 - 5 WBC/hpf   Bacteria, UA NONE SEEN NONE SEEN   Squamous Epithelial / LPF 0-5 0 - 5    Comment: Performed at [facility], [address], [place], [place] [address]  Sodium, urine, random     Status: None   Collection Time: 08/26/18  8:33 AM  Result Value Ref Range   Sodium, Ur 67 mmol/L    Comment: Performed at [facility], [address] [place], [place] [address]  Osmolality, urine     Status: None   Collection Time: 08/26/18  8:33 AM  Result Value Ref Range   Osmolality, Ur 366 300 - 900 mOsm/kg    Comment: Performed at [facility] [address], [place], [place] [address]  Comprehensive metabolic panel     Status: Abnormal   Collection Time: 08/27/18  6:28 AM  Result Value Ref Range   Sodium 129 (L) 135 - 145 mmol/L   Potassium 3.4 (L) 3.5 - 5.1 mmol/L   Chloride 93 (L) 98 - 111 mmol/L   CO2 25 22 - 32 mmol/L   Glucose, Bld 132 (H) 70 - 99 mg/dL   BUN 18 8 - 23 mg/dL   Creatinine, Ser 0.67 0.44 - 1.00 mg/dL   Calcium 6.5 (L) 8.9 - 10.3 mg/dL   Total Protein 5.5 (L) 6.5 - 8.1 g/dL   Albumin 3.1 (L) 3.5 - 5.0 g/dL   AST 20 15 - 41 U/L   ALT 29 0 - 44 U/L   Alkaline Phosphatase 63 38 - 126 U/L   Total Bilirubin 1.0 0.3 - 1.2 mg/dL   GFR calc non Af Amer >60 >60 mL/min   GFR calc Af Amer >60 >60 mL/min   Anion gap 11 5 - 15    Comment: Performed at [facility], [address], [place], [place] [address]  CBC     Status: Abnormal   Collection Time: 08/27/18  6:28 AM  Result Value Ref Range   WBC 18.0 (H) 4.0 - 10.5 K/uL   RBC 3.99 3.87 - 5.11 MIL/uL   Hemoglobin 12.3 12.0 - 15.0 g/dL   HCT 36.3 36.0 - 46.0 %   MCV 91.0 80.0 - 100.0 fL   MCH 30.8 26.0 - 34.0 pg   MCHC 33.9 30.0 - 36.0 g/dL   RDW 13.1 11.5 - 15.5 %   Platelets 343 150 - 400 K/uL   nRBC 0.0 0.0 - 0.2 %    Comment: Performed at [facility],  [address], [place], [place] [address]    ABGS No results for  input(s): PHART, PO2ART, TCO2, HCO3 in the last 72 hours.  Invalid input(s): PCO2 CULTURES Recent Results (from the past 240 hour(s))  SARS Coronavirus 2 ([place] - Performed in [facility] lab), Hosp Order     Status: None   Collection Time: 08/26/18  5:53 AM   Specimen: Nasopharyngeal Swab  Result Value Ref Range Status   SARS Coronavirus 2 NEGATIVE NEGATIVE Final    Comment: (NOTE) If result is NEGATIVE SARS-CoV-2 target nucleic acids are NOT DETECTED. The SARS-CoV-2 RNA is generally detectable in upper and lower  respiratory specimens during the acute phase of infection. The lowest  concentration of SARS-CoV-2 viral copies this assay can detect is 250  copies / mL. A negative result does not preclude SARS-CoV-2 infection  and should not be used as the sole basis for treatment or other  patient management decisions.  A negative result may occur with  improper specimen collection / handling, submission of specimen other  than nasopharyngeal swab, presence of viral mutation(s) within the  areas targeted by this assay, and inadequate number of viral copies  (<250 copies / mL). A negative result must be combined with clinical  observations, patient history, and epidemiological information. If result is POSITIVE SARS-CoV-2 target nucleic acids are DETECTED. The SARS-CoV-2 RNA is generally detectable in upper and lower  respiratory specimens dur ing the acute phase of infection.  Positive  results are indicative of active infection with SARS-CoV-2.  Clinical  correlation with patient history and other diagnostic information is  necessary to determine patient infection status.  Positive results do  not rule out bacterial infection or co-infection with other viruses. If result is PRESUMPTIVE POSTIVE SARS-CoV-2 nucleic acids MAY BE PRESENT.   A presumptive positive result was obtained on the submitted specimen  and  confirmed on repeat testing.  While 2019 novel coronavirus  (SARS-CoV-2) nucleic acids may be present in the submitted sample  additional confirmatory testing may be necessary for epidemiological  and / or clinical management purposes  to differentiate between  SARS-CoV-2 and other Sarbecovirus currently known to infect humans.  If clinically indicated additional testing with an alternate test  methodology [phone]) is advised. The SARS-CoV-2 RNA is generally  detectable in upper and lower respiratory sp ecimens during the acute  phase of infection. The expected result is Negative. Fact Sheet for Patients:  StrictlyIdeas.no Fact Sheet for Healthcare Providers: BankingDealers.co.za This test is not yet approved or cleared by the [place] FDA and has been authorized for detection and/or diagnosis of SARS-CoV-2 by FDA under an Emergency Use Authorization (EUA).  This EUA will remain in effect (meaning this test can be used) for the duration of the COVID-19 declaration under Section 564(b)(1) of the Act, 21 U.S.C. section 360bbb-3(b)(1), unless the authorization is terminated or revoked sooner. Performed at [facility], [address], [place], [place] [address]    Studies/Results: Nm Pulmonary Perf And Vent  Result Date: 08/26/2018 CLINICAL DATA:  Nausea, elevated D-dimer, question pulmonary embolism, intermediate clinical probability for PE; history COPD, hypertension, stroke EXAM: NUCLEAR MEDICINE VENTILATION - PERFUSION LUNG SCAN TECHNIQUE: Ventilation images were obtained in multiple projections using inhaled aerosol Tc-8m DTPA. Perfusion images were obtained in multiple projections after intravenous injection of Tc-40m MAA. RADIOPHARMACEUTICALS:  19 mCi of Tc-85m DTPA aerosol inhalation and 1.75 mCi Tc64m MAA IV COMPARISON:  None Correlation: Chest radiograph 08/26/2018 FINDINGS: Ventilation: Inhomogeneous peripheral ventilation  throughout the mid to lower RIGHT lung. Subsegmental defects are identified at the lingula and RIGHT  middle lobe. Perfusion: Matching perfusion defects at lingula and RIGHT middle lobe. Inhomogeneous perfusion peripherally in the RIGHT lung matching ventilatory findings. Chest radiograph: COPD changes with RIGHT basilar atelectasis and question minimal LEFT basilar atelectasis. IMPRESSION: Matching ventilation and perfusion abnormalities in the RIGHT middle lobe and lingula. Inhomogeneous ventilation and perfusion in the periphery of the RIGHT lung consistent with emphysematous changes and COPD seen on chest radiograph. Low probability for pulmonary embolism. Electronically Signed   By: Lavonia Dana M.D.   On: 08/26/2018 11:11   Dg Chest Portable 1 View  Result Date: 08/26/2018 CLINICAL DATA:  Hypoxia. Increased shortness of breath with exertion. EXAM: PORTABLE CHEST 1 VIEW COMPARISON:  06/30/2018 and 06/26/2018 FINDINGS: The heart size is normal. Pulmonary vascularity is at the upper limits of normal. No infiltrates or effusions. No acute bone abnormality. S old healed left rib fracture. Severe arthritic changes of the right shoulder. Left shoulder prosthesis. IMPRESSION: No acute cardiopulmonary disease. Electronically Signed   By: Lorriane Shire M.D.   On: 08/26/2018 05:21   Ct Angio Abd/pel W And/or Wo Contrast  Result Date: 08/26/2018 CLINICAL DATA:  Diarrhea concern for mesenteric ischemia EXAM: CTA ABDOMEN AND PELVIS wITHOUT AND WITH CONTRAST TECHNIQUE: Multidetector CT imaging of the abdomen and pelvis was performed using the standard protocol during bolus administration of intravenous contrast. Multiplanar reconstructed images and MIPs were obtained and reviewed to evaluate the vascular anatomy. CONTRAST:  136mL OMNIPAQUE IOHEXOL 350 MG/ML SOLN COMPARISON:  CT angiography 01/01/2018, CT 12/02/2016 FINDINGS: VASCULAR Aorta: Extensive aortic atherosclerosis with mild irregular luminal narrowing of the  proximal infrarenal abdominal aorta but no acute occlusion. No dissection. No aneurysm. Celiac: Calcification at the origin without significant stenosis. Branch vessels appear patent. 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 Diseased distal SMA but with flow enhancement present. Vessels appear diffusely diminutive. Renals: 2 right and single left renal arteries. Prominent calcification at origin of left renal artery without significant stenosis. Calcification at the origins of both right renal arteries with suspected mild-to-moderate stenosis at the origins. IMA: Diminutive in size but with flow enhancement present. Inflow: Moderate to marked atherosclerosis of the bilateral common iliac and external iliac arteries. Moderately diseased internal iliac arteries. Proximal Outflow: Atherosclerotic calcifications.  No occlusions. Veins: No obvious venous abnormality within the limitations of this arterial phase study. Review of the MIP images confirms the above findings. NON-VASCULAR Lower chest: Lung bases demonstrate no acute consolidation or effusion. Hepatobiliary: No focal liver abnormality is seen. Status post cholecystectomy. No biliary dilatation. Pancreas: Unremarkable. No pancreatic ductal dilatation or surrounding inflammatory changes. Spleen: Normal in size without focal abnormality. Adrenals/Urinary Tract: Adrenal glands are unremarkable. Kidneys are normal, without renal calculi, focal lesion, or hydronephrosis. Bladder is unremarkable. Stomach/Bowel: Stomach is nonenlarged. Fluid-filled small and large bowel without bowel wall thickening. Sigmoid colon diverticula without acute inflammatory change. Lymphatic: No significantly enlarged lymph nodes Reproductive: Status post hysterectomy. No adnexal masses. Other: Negative for free air or free fluid Musculoskeletal: Subacute to chronic right inferior pubic ramus fracture. Intramedullary rod  within the right femur. Advanced degenerative changes of the spine. Chronic compression deformities T11-T12 and L1. IMPRESSION: VASCULAR 1. Stable chronic occlusion of the SMA. There is distal patency of the SMA. IMA appears diminutive but is grossly patent. Mesenteric vessels appear diffusely attenuated. 2. Extensive aortic atherosclerosis of the abdominal aorta and branch vessels without high-grade stenosis or acute occlusive disease. NON-VASCULAR 1. Negative for bowel wall thickening or acute  colitis. Diffuse fluid within the small and large bowel, possibly due to enteritis 2. Healing right inferior pubic ramus fracture 3. Sigmoid colon diverticular disease without acute inflammatory process Electronically Signed   By: Donavan Foil M.D.   On: 08/26/2018 03:24    Medications:  Prior to Admission:  Medications Prior to Admission  Medication Sig Dispense Refill Last Dose  . Ascorbic Acid (VITAMIN C) 1000 MG tablet Take 1,000 mg by mouth 2 (two) times daily.    08/25/2018 at Unknown time  . aspirin EC 81 MG tablet Take 81 mg by mouth daily.   08/25/2018 at 900  . Calcium Carb-Cholecalciferol (CALCIUM 600 + D PO) Take 1-2 tablets by mouth See admin instructions. 2 tabs in the morning, and 1 tab at [facility]   08/25/2018 at Unknown time  . diltiazem (CARDIZEM CD) 240 MG 24 hr capsule TAKE 1 CAPSULE(240 MG) BY MOUTH DAILY 60 capsule 0 08/25/2018 at Unknown time  . diphenhydrAMINE (BENADRYL) 25 mg capsule Take 25 mg by mouth at bedtime.   Past Week at Unknown time  . DULoxetine (CYMBALTA) 30 MG capsule Take 30 mg by mouth daily.    08/25/2018 at Unknown time  . Flaxseed, Linseed, (FLAXSEED OIL) 1000 MG CAPS Take 1 capsule by mouth 2 (two) times daily.   08/25/2018 at Unknown time  . Multiple Vitamin (MULTIVITAMIN WITH MINERALS) TABS Take 1 tablet by mouth daily.   08/25/2018 at Unknown time  . niacin 500 MG CR capsule Take 500 mg by mouth at bedtime.   08/25/2018 at Unknown time  . Omega-3 1000 MG CAPS Take 1 capsule by  mouth daily.   08/25/2018 at Unknown time  . pantoprazole (PROTONIX) 40 MG tablet Take 1 tablet (40 mg total) by mouth daily. 30 tablet 11 08/25/2018 at Unknown time  . traMADol (ULTRAM) 50 MG tablet Take 1 tablet (50 mg total) by mouth every 6 (six) hours as needed for moderate pain. 20 tablet 1 Past Week at Unknown time  . levofloxacin (LEVAQUIN) 500 MG tablet Take 1 tablet (500 mg total) by mouth daily at 6 PM. (Patient not taking: Reported on 08/26/2018) 3 tablet 0 Completed Course at Unknown time   Scheduled: . aspirin EC  81 mg Oral Daily  . dicyclomine  10 mg Oral TID AC  . diltiazem  240 mg Oral Daily  . diphenhydrAMINE  25 mg Oral QHS  . DULoxetine  30 mg Oral Daily  . enoxaparin (LOVENOX) injection  40 mg Subcutaneous Q24H  . nicotine  7 mg Transdermal Daily  . pantoprazole  40 mg Oral Daily  . sodium chloride flush  3 mL Intravenous Once  . umeclidinium bromide  1 puff Inhalation Daily   Continuous:  CNO:[id number] **OR** acetaminophen, levalbuterol, ondansetron (ZOFRAN) IV, promethazine, traMADol  Assesment: She was admitted with abdominal pain likely exacerbation of chronic mesenteric ischemia.  She does not feel that the last treatment helped.  She is unfortunately still smoking cigarettes which probably increases her problem  At baseline she has COPD and she is still smoking.  She had a previous stroke and has had a good recovery  She had recent pelvic fracture and says she is doing okay as far as that is concerned  She has significant trouble with depression which is ongoing  She still has both hyponatremia and hypokalemia. Principal Problem:   Hypoxia Active Problems:   Essential hypertension   COPD (chronic obstructive pulmonary disease) (HCC)   Hyponatremia   Hypokalemia  Plan: Continue fluids.  Continue potassium replacement.  She can try a soft diet await enteric pathogen panel    LOS: 0 days   Alonza Bogus 08/27/2018, 8:17 AM

## 2018-08-28 DIAGNOSIS — E440 Moderate protein-calorie malnutrition: Secondary | ICD-10-CM | POA: Insufficient documentation

## 2018-08-28 LAB — CBC
HCT: 35 % — ABNORMAL LOW (ref 36.0–46.0)
Hemoglobin: 11.9 g/dL — ABNORMAL LOW (ref 12.0–15.0)
MCH: 31.2 pg (ref 26.0–34.0)
MCHC: 34 g/dL (ref 30.0–36.0)
MCV: 91.9 fL (ref 80.0–100.0)
Platelets: 302 10*3/uL (ref 150–400)
RBC: 3.81 MIL/uL — ABNORMAL LOW (ref 3.87–5.11)
RDW: 13.2 % (ref 11.5–15.5)
WBC: 14.1 10*3/uL — ABNORMAL HIGH (ref 4.0–10.5)
nRBC: 0 % (ref 0.0–0.2)

## 2018-08-28 LAB — BASIC METABOLIC PANEL
Anion gap: 9 (ref 5–15)
BUN: 15 mg/dL (ref 8–23)
CO2: 24 mmol/L (ref 22–32)
Calcium: 6.1 mg/dL — CL (ref 8.9–10.3)
Chloride: 97 mmol/L — ABNORMAL LOW (ref 98–111)
Creatinine, Ser: 0.53 mg/dL (ref 0.44–1.00)
GFR calc Af Amer: >60 mL/min (ref >60)
GFR calc non Af Amer: >60 mL/min (ref >60)
Glucose, Bld: 95 mg/dL (ref 70–99)
Potassium: 3.2 mmol/L — ABNORMAL LOW (ref 3.5–5.1)
Sodium: 130 mmol/L — ABNORMAL LOW (ref 135–145)

## 2018-08-28 LAB — ALBUMIN: Albumin: 2.9 g/dL — ABNORMAL LOW (ref 3.5–5.0)

## 2018-08-28 MED ORDER — PROMETHAZINE HCL 25 MG/ML IJ SOLN: 12.5000 mg | Freq: Four times a day (QID) | INTRAMUSCULAR | Status: DC | PRN

## 2018-08-28 MED ORDER — CALCIUM GLUCONATE-NACL 1-0.675 GM/50ML-% IV SOLN
1.0000 g | INTRAVENOUS | Status: AC
  Administered 2018-08-28 (×2): 1000 mg via INTRAVENOUS
  Filled 2018-08-28: qty 50

## 2018-08-28 MED ORDER — METRONIDAZOLE 500 MG PO TABS
250.0000 mg | ORAL_TABLET | Freq: Three times a day (TID) | ORAL | Status: DC
  Administered 2018-08-28 (×2): 250 mg via ORAL
  Administered 2018-08-29: 10:00:00 via ORAL
  Filled 2018-08-28 (×5): qty 1

## 2018-08-28 MED ORDER — POTASSIUM CHLORIDE CRYS ER 20 MEQ PO TBCR
40.0000 meq | EXTENDED_RELEASE_TABLET | Freq: Once | ORAL | Status: AC
  Administered 2018-08-28: 40 meq via ORAL
  Filled 2018-08-28: qty 2

## 2018-08-28 NOTE — Progress Notes (Signed)
Subjective:  Patient states she had diarrhea all day yesterday.  She is convinced her diarrhea was caused by dicyclomine which has been discontinued by Dr. Luan Pulling.  She has not had any bowel movement today but she is passing flatus.  She does not have a good appetite.  She does not remember as to how much she ate yesterday.  She says this morning all she has feels Cheerios.  She denies abdominal pain at this time.  Objective: Blood pressure (!) 160/62, pulse (P) 77, temperature (P) 98.4 F (36.9 C), temperature source (P) Oral, resp. rate 14, weight 48.6 kg, SpO2 95 %. Patient is alert and in no acute distress. Abdomen is somewhat full.  Bowel sounds are normal.  No bruit noted.  On palpation abdomen is soft with mild diffuse tenderness without any guarding.  No organomegaly or masses. No LE edema or clubbing noted.  Labs/studies Results:  CBC Latest Ref Rng & Units 08/28/2018 08/27/2018 08/25/2018  WBC 4.0 - 10.5 K/uL 14.1(H) 18.0(H) 14.7(H)  Hemoglobin 12.0 - 15.0 g/dL 11.9(L) 12.3 15.9(H)  Hematocrit 36.0 - 46.0 % 35.0(L) 36.3 45.4  Platelets 150 - 400 K/uL 302 343 412(H)    CMP Latest Ref Rng & Units 08/28/2018 08/27/2018 08/25/2018  Glucose 70 - 99 mg/dL 95 132(H) 136(H)  BUN 8 - 23 mg/dL _0 Creatinine 0.44 - 1.00 mg/dL 0.53 0.67 0.61  Sodium 135 - 145 mmol/L 130(L) 129(L) 127(L)  Potassium 3.5 - 5.1 mmol/L 3.2(L) 3.4(L) 2.9(L)  Chloride 98 - 111 mmol/L 97(L) 93(L) 84(L)  CO2 22 - 32 mmol/L _1 Calcium 8.9 - 10.3 mg/dL 6.1(LL) 6.5(L) 7.6(L)  Total Protein 6.5 - 8.1 g/dL - 5.5(L) 7.0  Total Bilirubin 0.3 - 1.2 mg/dL - 1.0 0.8  Alkaline Phos 38 - 126 U/L - 63 86  AST 15 - 41 U/L - 20 24  ALT 0 - 44 U/L - 29 32    Hepatic Function Latest Ref Rng & Units 08/27/2018 08/25/2018 06/26/2018  Total Protein 6.5 - 8.1 g/dL 5.5(L) 7.0 7.5  Albumin 3.5 - 5.0 g/dL 3.1(L) 4.2 4.2  AST 15 - 41 U/L _2 ALT 0 - 44 U/L 29 32 37  Alk Phosphatase 38 - 126 U/L 63 86 108  Total Bilirubin  0.3 - 1.2 mg/dL 1.0 0.8 1.4(H)    GI pathogen panel is negative.  Assessment:  #1.  Nausea vomiting and diarrhea.  Nausea and vomiting is stopped but she remains with diarrhea.  GI pathogen panel is negative.  Suspect diarrhea is secondary to chronic mesenteric ischemia.  She is at risk for small bowel intestinal bacterial overgrowth.  Since she is quite symptomatic it would be reasonable to treat her empirically with metronidazole and see how she does.  #2.  Acute on chronic abdominal pain.  She is back to her baseline as far as her pain is concerned.  Pain occurs primarily when she eats.  #3.  Electrolyte abnormalities.  Hyponatremia slowly improving but she remains with hypokalemia.  Dr. Luan Pulling is addressing this.  #4.  Hypocalcemia.  She does not have any symptoms of hypokalemia such as carpopedal spasm.  I believe her serum albumin must be very low ionized calcium may be normal.  Patient begun on p.o. supplement by Dr. Luan Pulling.   Recommendations:  Check serum albumin on serum from this morning in order to calculate ionized calcium. Check vitamin D2 level. Metronidazole to 50 mg by mouth after each meal.

## 2018-08-28 NOTE — Progress Notes (Signed)
Subjective: She has a lot of diarrhea that she thinks is from Bentyl.  She does not have any other new complaints.  She still has abdominal discomfort in her epigastric area.  Objective: Vital signs in last 24 hours: Temp:  [98.4 F (36.9 C)] (P) 98.4 F (36.9 C) (07/07 2153) Pulse Rate:  [77] (P) 77 (07/07 2153) BP: (P) 143/124 (07/07 2153) SpO2:  [95 %] 95 % (07/08 0744) Weight change:  Last BM Date: 08/28/18  Intake/Output from previous day: 07/07 0701 - 07/08 0700 In: 787.7 [P.O.:240; I.V.:547.7] Out: -   PHYSICAL EXAM General appearance: alert, cooperative, mild distress and Very thin Resp: clear to auscultation bilaterally Cardio: regular rate and rhythm, S1, S2 normal, no murmur, click, rub or gallop GI: Mildly diffusely tender Extremities: extremities normal, atraumatic, no cyanosis or edema  Lab Results:  Results for orders placed or performed during the hospital encounter of 08/25/18 (from the past 48 hour(s))  Gastrointestinal Panel by PCR , Stool     Status: None   Collection Time: 08/26/18 11:33 PM   Specimen: Stool  Result Value Ref Range   Campylobacter species NOT DETECTED NOT DETECTED   Plesimonas shigelloides NOT DETECTED NOT DETECTED   Salmonella species NOT DETECTED NOT DETECTED   Yersinia enterocolitica NOT DETECTED NOT DETECTED   Vibrio species NOT DETECTED NOT DETECTED   Vibrio cholerae NOT DETECTED NOT DETECTED   Enteroaggregative E coli (EAEC) NOT DETECTED NOT DETECTED   Enteropathogenic E coli (EPEC) NOT DETECTED NOT DETECTED   Enterotoxigenic E coli (ETEC) NOT DETECTED NOT DETECTED   Shiga like toxin producing E coli (STEC) NOT DETECTED NOT DETECTED   Shigella/Enteroinvasive E coli (EIEC) NOT DETECTED NOT DETECTED   Cryptosporidium NOT DETECTED NOT DETECTED   Cyclospora cayetanensis NOT DETECTED NOT DETECTED   Entamoeba histolytica NOT DETECTED NOT DETECTED   Giardia lamblia NOT DETECTED NOT DETECTED   Adenovirus F40/41 NOT DETECTED NOT  DETECTED   Astrovirus NOT DETECTED NOT DETECTED   Norovirus GI/GII NOT DETECTED NOT DETECTED   Rotavirus A NOT DETECTED NOT DETECTED   Sapovirus (I, II, IV, and V) NOT DETECTED NOT DETECTED    Comment: Performed at [facility], [address] [place], [place] [address]  Comprehensive metabolic panel     Status: Abnormal   Collection Time: 08/27/18  6:28 AM  Result Value Ref Range   Sodium 129 (L) 135 - 145 mmol/L   Potassium 3.4 (L) 3.5 - 5.1 mmol/L   Chloride 93 (L) 98 - 111 mmol/L   CO2 25 22 - 32 mmol/L   Glucose, Bld 132 (H) 70 - 99 mg/dL   BUN 18 8 - 23 mg/dL   Creatinine, Ser 0.67 0.44 - 1.00 mg/dL   Calcium 6.5 (L) 8.9 - 10.3 mg/dL   Total Protein 5.5 (L) 6.5 - 8.1 g/dL   Albumin 3.1 (L) 3.5 - 5.0 g/dL   AST 20 15 - 41 U/L   ALT 29 0 - 44 U/L   Alkaline Phosphatase 63 38 - 126 U/L   Total Bilirubin 1.0 0.3 - 1.2 mg/dL   GFR calc non Af Amer >60 >60 mL/min   GFR calc Af Amer >60 >60 mL/min   Anion gap 11 5 - 15    Comment: Performed at [facility], [address] [place], [place] [address]  CBC     Status: Abnormal   Collection Time: 08/27/18  6:28 AM  Result Value Ref Range   WBC 18.0 (H) 4.0 -  10.5 K/uL   RBC 3.99 3.87 - 5.11 MIL/uL   Hemoglobin 12.3 12.0 - 15.0 g/dL   HCT 36.3 36.0 - 46.0 %   MCV 91.0 80.0 - 100.0 fL   MCH 30.8 26.0 - 34.0 pg   MCHC 33.9 30.0 - 36.0 g/dL   RDW 13.1 11.5 - 15.5 %   Platelets 343 150 - 400 K/uL   nRBC 0.0 0.0 - 0.2 %    Comment: Performed at [facility], [address] [place], [place] [address]  CBC     Status: Abnormal   Collection Time: 08/28/18  7:16 AM  Result Value Ref Range   WBC 14.1 (H) 4.0 - 10.5 K/uL   RBC 3.81 (L) 3.87 - 5.11 MIL/uL   Hemoglobin 11.9 (L) 12.0 - 15.0 g/dL   HCT 35.0 (L) 36.0 - 46.0 %   MCV 91.9 80.0 - 100.0 fL   MCH 31.2 26.0 - 34.0 pg   MCHC 34.0 30.0 - 36.0 g/dL   RDW 13.2 11.5 - 15.5 %   Platelets 302 150 - 400 K/uL   nRBC 0.0 0.0 - 0.2 %    Comment: Performed at [facility], [address] [place], [place] [address]  Basic metabolic panel     Status: Abnormal   Collection Time: 08/28/18  7:16 AM  Result Value Ref Range   Sodium 130 (L) 135 - 145 mmol/L   Potassium 3.2 (L) 3.5 - 5.1 mmol/L   Chloride 97 (L) 98 - 111 mmol/L   CO2 24 22 - 32 mmol/L   Glucose, Bld 95 70 - 99 mg/dL   BUN 15 8 - 23 mg/dL   Creatinine, Ser 0.53 0.44 - 1.00 mg/dL   Calcium 6.1 (LL) 8.9 - 10.3 mg/dL    Comment: CRITICAL RESULT CALLED TO, READ BACK BY AND VERIFIED WITH: JACKSON,N AT 8:20AM ON 08/28/18 BY FESTERMAN,C    GFR calc non Af Amer >60 >60 mL/min   GFR calc Af Amer >60 >60 mL/min   Anion gap 9 5 - 15    Comment: Performed at [facility], [address] [place], [place] [address]    ABGS No results for input(s): PHART, PO2ART, TCO2, HCO3 in the last 72 hours.  Invalid input(s): PCO2 CULTURES Recent Results (from the past 240 hour(s))  C difficile quick scan w PCR reflex     Status: None   Collection Time: 08/25/18 11:21 PM   Specimen: Stool  Result Value Ref Range Status   C Diff antigen NEGATIVE NEGATIVE Final   C Diff toxin NEGATIVE NEGATIVE Final   C Diff interpretation No C. difficile detected.  Final    Comment: Performed at [facility], [address] [place], [place] [address]  SARS Coronavirus 2 ([place] - Performed in [place] hospital lab), Hosp Order     Status: None   Collection Time: 08/26/18  5:53 AM   Specimen: Nasopharyngeal Swab  Result Value Ref Range Status   SARS Coronavirus 2 NEGATIVE NEGATIVE Final    Comment: (NOTE) If result is NEGATIVE SARS-CoV-2 target nucleic acids are NOT DETECTED. The SARS-CoV-2 RNA is generally detectable in upper and lower  respiratory specimens during the acute phase of infection. The lowest  concentration of SARS-CoV-2 viral copies this assay can detect is 250  copies / mL. A negative result does not preclude SARS-CoV-2 infection  and should not be used as the sole basis for treatment or other  patient  management decisions.  A negative result  may occur with  improper specimen collection / handling, submission of specimen other  than nasopharyngeal swab, presence of viral mutation(s) within the  areas targeted by this assay, and inadequate number of viral copies  (<250 copies / mL). A negative result must be combined with clinical  observations, patient history, and epidemiological information. If result is POSITIVE SARS-CoV-2 target nucleic acids are DETECTED. The SARS-CoV-2 RNA is generally detectable in upper and lower  respiratory specimens dur ing the acute phase of infection.  Positive  results are indicative of active infection with SARS-CoV-2.  Clinical  correlation with patient history and other diagnostic information is  necessary to determine patient infection status.  Positive results do  not rule out bacterial infection or co-infection with other viruses. If result is PRESUMPTIVE POSTIVE SARS-CoV-2 nucleic acids MAY BE PRESENT.   A presumptive positive result was obtained on the submitted specimen  and confirmed on repeat testing.  While 2019 novel coronavirus  (SARS-CoV-2) nucleic acids may be present in the submitted sample  additional confirmatory testing may be necessary for epidemiological  and / or clinical management purposes  to differentiate between  SARS-CoV-2 and other Sarbecovirus currently known to infect humans.  If clinically indicated additional testing with an alternate test  methodology [phone]) is advised. The SARS-CoV-2 RNA is generally  detectable in upper and lower respiratory sp ecimens during the acute  phase of infection. The expected result is Negative. Fact Sheet for Patients:  StrictlyIdeas.no Fact Sheet for Healthcare Providers: BankingDealers.co.za This test is not yet approved or cleared by the [place] FDA and has been authorized for detection and/or diagnosis of SARS-CoV-2 by FDA under  an Emergency Use Authorization (EUA).  This EUA will remain in effect (meaning this test can be used) for the duration of the COVID-19 declaration under Section 564(b)(1) of the Act, 21 U.S.C. section 360bbb-3(b)(1), unless the authorization is terminated or revoked sooner. Performed at [facility], [address], [place], [place] [address]   Gastrointestinal Panel by PCR , Stool     Status: None   Collection Time: 08/26/18 11:33 PM   Specimen: Stool  Result Value Ref Range Status   Campylobacter species NOT DETECTED NOT DETECTED Final   Plesimonas shigelloides NOT DETECTED NOT DETECTED Final   Salmonella species NOT DETECTED NOT DETECTED Final   Yersinia enterocolitica NOT DETECTED NOT DETECTED Final   Vibrio species NOT DETECTED NOT DETECTED Final   Vibrio cholerae NOT DETECTED NOT DETECTED Final   Enteroaggregative E coli (EAEC) NOT DETECTED NOT DETECTED Final   Enteropathogenic E coli (EPEC) NOT DETECTED NOT DETECTED Final   Enterotoxigenic E coli (ETEC) NOT DETECTED NOT DETECTED Final   Shiga like toxin producing E coli (STEC) NOT DETECTED NOT DETECTED Final   Shigella/Enteroinvasive E coli (EIEC) NOT DETECTED NOT DETECTED Final   Cryptosporidium NOT DETECTED NOT DETECTED Final   Cyclospora cayetanensis NOT DETECTED NOT DETECTED Final   Entamoeba histolytica NOT DETECTED NOT DETECTED Final   Giardia lamblia NOT DETECTED NOT DETECTED Final   Adenovirus F40/41 NOT DETECTED NOT DETECTED Final   Astrovirus NOT DETECTED NOT DETECTED Final   Norovirus GI/GII NOT DETECTED NOT DETECTED Final   Rotavirus A NOT DETECTED NOT DETECTED Final   Sapovirus (I, II, IV, and V) NOT DETECTED NOT DETECTED Final    Comment: Performed at [facility], [address] [place], [place] [address]   Studies/Results: Nm Pulmonary Perf And Vent  Result Date: 08/26/2018 CLINICAL DATA:  Nausea, elevated D-dimer, question  pulmonary embolism, intermediate clinical probability for PE; history COPD,  hypertension, stroke EXAM: NUCLEAR MEDICINE VENTILATION - PERFUSION LUNG SCAN TECHNIQUE: Ventilation images were obtained in multiple projections using inhaled aerosol Tc-49m DTPA. Perfusion images were obtained in multiple projections after intravenous injection of Tc-32m MAA. RADIOPHARMACEUTICALS:  19 mCi of Tc-81m DTPA aerosol inhalation and 1.75 mCi Tc51m MAA IV COMPARISON:  None Correlation: Chest radiograph 08/26/2018 FINDINGS: Ventilation: Inhomogeneous peripheral ventilation throughout the mid to lower RIGHT lung. Subsegmental defects are identified at the lingula and RIGHT middle lobe. Perfusion: Matching perfusion defects at lingula and RIGHT middle lobe. Inhomogeneous perfusion peripherally in the RIGHT lung matching ventilatory findings. Chest radiograph: COPD changes with RIGHT basilar atelectasis and question minimal LEFT basilar atelectasis. IMPRESSION: Matching ventilation and perfusion abnormalities in the RIGHT middle lobe and lingula. Inhomogeneous ventilation and perfusion in the periphery of the RIGHT lung consistent with emphysematous changes and COPD seen on chest radiograph. Low probability for pulmonary embolism. Electronically Signed   By: Lavonia Dana M.D.   On: 08/26/2018 11:11    Medications:  Prior to Admission:  Medications Prior to Admission  Medication Sig Dispense Refill Last Dose  . Ascorbic Acid (VITAMIN C) 1000 MG tablet Take 1,000 mg by mouth 2 (two) times daily.    08/25/2018 at Unknown time  . aspirin EC 81 MG tablet Take 81 mg by mouth daily.   08/25/2018 at 900  . Calcium Carb-Cholecalciferol (CALCIUM 600 + D PO) Take 1-2 tablets by mouth See admin instructions. 2 tabs in the morning, and 1 tab at [facility]   08/25/2018 at Unknown time  . diltiazem (CARDIZEM CD) 240 MG 24 hr capsule TAKE 1 CAPSULE(240 MG) BY MOUTH DAILY 60 capsule 0 08/25/2018 at Unknown time  . diphenhydrAMINE (BENADRYL) 25 mg capsule Take 25 mg by mouth at bedtime.   Past Week at Unknown time  . DULoxetine  (CYMBALTA) 30 MG capsule Take 30 mg by mouth daily.    08/25/2018 at Unknown time  . Flaxseed, Linseed, (FLAXSEED OIL) 1000 MG CAPS Take 1 capsule by mouth 2 (two) times daily.   08/25/2018 at Unknown time  . Multiple Vitamin (MULTIVITAMIN WITH MINERALS) TABS Take 1 tablet by mouth daily.   08/25/2018 at Unknown time  . niacin 500 MG CR capsule Take 500 mg by mouth at bedtime.   08/25/2018 at Unknown time  . Omega-3 1000 MG CAPS Take 1 capsule by mouth daily.   08/25/2018 at Unknown time  . pantoprazole (PROTONIX) 40 MG tablet Take 1 tablet (40 mg total) by mouth daily. 30 tablet 11 08/25/2018 at Unknown time  . traMADol (ULTRAM) 50 MG tablet Take 1 tablet (50 mg total) by mouth every 6 (six) hours as needed for moderate pain. 20 tablet 1 Past Week at Unknown time  . levofloxacin (LEVAQUIN) 500 MG tablet Take 1 tablet (500 mg total) by mouth daily at 6 PM. (Patient not taking: Reported on 08/26/2018) 3 tablet 0 Completed Course at Unknown time   Scheduled: . aspirin EC  81 mg Oral Daily  . dicyclomine  10 mg Oral TID AC  . diltiazem  240 mg Oral Daily  . diphenhydrAMINE  25 mg Oral QHS  . DULoxetine  30 mg Oral Daily  . enoxaparin (LOVENOX) injection  40 mg Subcutaneous Q24H  . feeding supplement  1 Container Oral TID BM  . multivitamin with minerals  1 tablet Oral Daily  . nicotine  7 mg Transdermal Daily  . pantoprazole  40 mg Oral Daily  .  sodium chloride flush  3 mL Intravenous Once  . umeclidinium bromide  1 puff Inhalation Daily   Continuous: . 0.9 % NaCl with KCl 20 mEq / L 50 mL/hr at 08/27/18 9311   ETK:[id number] **OR** acetaminophen, levalbuterol, loperamide, ondansetron (ZOFRAN) IV, promethazine, traMADol  Assesment: She was admitted with abdominal discomfort with nausea and vomiting.  She is known to have mesenteric ischemia and I think that is the source of her abdominal problems.  She felt Bentyl helped some with her pain but she thinks it is causing diarrhea so I am going to hold it  at least for now.  She has had hyponatremia and hypokalemia and today her sodium was 130 which is a little bit better but potassium is lower at 3.2 I think probably from diarrhea  Is hypocalcemic and that will need to be replaced  She has significant malnutrition and she is on supplementation Principal Problem:   Hypoxia Active Problems:   Essential hypertension   COPD (chronic obstructive pulmonary disease) (HCC)   Hyponatremia   Hypokalemia   Malnutrition of moderate degree    Plan: Continue treatments.  Discontinue Bentyl.  Her gastrointestinal pathogen panel is negative so discontinue precautions    LOS: 1 day   [name] 08/28/2018, 8:35 AM

## 2018-08-29 LAB — BASIC METABOLIC PANEL
Anion gap: 12 (ref 5–15)
BUN: 18 mg/dL (ref 8–23)
CO2: 20 mmol/L — ABNORMAL LOW (ref 22–32)
Calcium: 6.4 mg/dL — CL (ref 8.9–10.3)
Chloride: 96 mmol/L — ABNORMAL LOW (ref 98–111)
Creatinine, Ser: 0.69 mg/dL (ref 0.44–1.00)
GFR calc Af Amer: >60 mL/min (ref >60)
GFR calc non Af Amer: >60 mL/min (ref >60)
Glucose, Bld: 79 mg/dL (ref 70–99)
Potassium: 3.9 mmol/L (ref 3.5–5.1)
Sodium: 128 mmol/L — ABNORMAL LOW (ref 135–145)

## 2018-08-29 LAB — HEPATIC FUNCTION PANEL
ALT: 19 U/L (ref 0–44)
AST: 15 U/L (ref 15–41)
Albumin: 2.8 g/dL — ABNORMAL LOW (ref 3.5–5.0)
Alkaline Phosphatase: 66 U/L (ref 38–126)
Bilirubin, Direct: 0.2 mg/dL (ref 0.0–0.2)
Indirect Bilirubin: 0.4 mg/dL (ref 0.3–0.9)
Total Bilirubin: 0.6 mg/dL (ref 0.3–1.2)
Total Protein: 5.3 g/dL — ABNORMAL LOW (ref 6.5–8.1)

## 2018-08-29 MED ORDER — DIPHENOXYLATE-ATROPINE 2.5-0.025 MG PO TABS: 1.0000 | ORAL_TABLET | Freq: Four times a day (QID) | ORAL | 1 refills | Status: DC | PRN

## 2018-08-29 MED ORDER — CALCIUM GLUCONATE-NACL 1-0.675 GM/50ML-% IV SOLN
1.0000 g | INTRAVENOUS | Status: AC
  Administered 2018-08-29 (×2): 1000 mg via INTRAVENOUS
  Filled 2018-08-29 (×2): qty 50

## 2018-08-29 MED ORDER — METRONIDAZOLE 250 MG PO TABS: 250.0000 mg | ORAL_TABLET | Freq: Three times a day (TID) | ORAL | 0 refills | Status: AC

## 2018-08-29 NOTE — TOC Initial Note (Addendum)
Transition of Care [facility]) - Initial/Assessment Note    Patient Details  Name: [name] MRN: [id number] Date of Birth: [birth year]  Transition of Care [facility]) CM/SW Contact:    Wendy Gully, LCSW Phone Number: 08/29/2018, 11:38 AM  Clinical Narrative:                 Patient lives alone, drives, is independent in ADLs and uses a cane. She she has no neighbors that can check on her and each of her sons live 4 and 5 hours away. Patient is active with [facility]  With RN, PT. A social worker was added to her [facility].  Son, [name], advised of discharge and [facility] disciplines orders placed.   Expected Discharge Plan: [place] Barriers to Discharge: No Barriers Identified   Patient Goals and CMS Choice Patient states their goals for this hospitalization and ongoing recovery are:: To be able to remain in home.      Expected Discharge Plan and Services Expected Discharge Plan: [place] arrangements for the past 2 months: Single Family Home Expected Discharge Date: 08/29/18                         HH Arranged: Social Work [employer] Agency: Microbiologist ([place])        Prior Living Arrangements/Services Living arrangements for the past 2 months: [place] Lives with:: Self Patient language and need for interpreter reviewed:: Yes Do you feel safe going back to the place where you live?: Yes      Need for Family Participation in Patient Care: Yes (Comment) Care giver support system in place?: Yes (comment) Current home services: DME, Home RN Criminal Activity/Legal Involvement Pertinent to Current Situation/Hospitalization: No - Comment as needed  Activities of Daily Living Home Assistive Devices/Equipment: Cane (specify quad or straight) ADL Screening (condition at time of admission) Patient's cognitive ability adequate to safely complete daily activities?: Yes Is the patient deaf or have difficulty hearing?:  No Does the patient have difficulty seeing, even when wearing glasses/contacts?: No Does the patient have difficulty concentrating, remembering, or making decisions?: No Patient able to express need for assistance with ADLs?: Yes Does the patient have difficulty dressing or bathing?: No Independently performs ADLs?: Yes (appropriate for developmental age) Does the patient have difficulty walking or climbing stairs?: Yes Weakness of Legs: Both Weakness of Arms/Hands: Both  Permission Sought/Granted Permission sought to share information with : Family Supports                Emotional Assessment Appearance:: Appears stated age   Affect (typically observed): Calm Orientation: : Oriented to Self, Oriented to Place, Oriented to  Time, Oriented to Situation Alcohol / Substance Use: Not Applicable Psych Involvement: No (comment)  Admission diagnosis:  Hypoxia [R09.02] Upper abdominal pain [R10.10] Diarrhea, unspecified type [R19.7] Patient Active Problem List   Diagnosis Date Noted  . Malnutrition of moderate degree 08/28/2018  . Hypoxia 08/26/2018  . Hypokalemia 08/26/2018  . Sepsis due to undetermined organism ([place]) 06/26/2018  . Acute respiratory failure with hypoxia ([place]) 06/26/2018  . Lobar pneumonia ([place]) 06/26/2018  . Closed fracture of multiple pubic rami, right, with delayed healing, subsequent encounter 06/26/2018  . Closed fracture of ramus of right pubis ([place])   . Acute colitis 12/02/2016  . Uncontrolled hypertension 12/02/2016  . Enteritis 12/02/2016  . Hyponatremia 12/02/2016  . Status post  total shoulder arthroplasty, left 11/21/2016  . COPD (chronic obstructive pulmonary disease) ([place]) 11/21/2016  . Weakness 11/19/2016  . DJD of left shoulder 11/15/2016  . DOE (dyspnea on exertion) 11/11/2016  . Rotator cuff arthropathy of left shoulder 10/30/2016  . Intertrochanteric fracture of right hip ([place]) 08/01/2015    Class: Acute  . SMA stenosis ([place]) 09/15/2014   . Abdominal pain, right upper quadrant 09/15/2014  . Essential hypertension 09/15/2014  . Depression 09/15/2014  . Hypothyroid 09/15/2014  . GERD without esophagitis 09/15/2014  . Lumbago 11/12/2013  . Stiffness of joint, not elsewhere classified, pelvic region and thigh 11/12/2013  . Muscle weakness (generalized) 11/12/2013  . Lack of coordination 03/21/2013  . Fine motor impairment 03/21/2013  . Left leg weakness 03/17/2013  . Risk for falls 03/17/2013  . Carotid artery disease ([place]) 03/11/2013  . Subclavian artery stenosis ([place]) 03/11/2013  . TIA (transient ischemic attack) 03/11/2013  . Tobacco use disorder 03/11/2013  . CVA (cerebral infarction) 03/05/2013  . Pain in joint, shoulder region 11/01/2010   PCP:  Sinda Du, MD Pharmacy:   [facility], [place] - [address] [place] [address] Phone: [phone] Fax: [phone]     Social Determinants of Health (SDOH) Interventions    Readmission Risk Interventions Readmission Risk Prevention Plan 08/29/2018  Transportation Screening Complete  PCP or Specialist Appt within 5-7 Days Complete  Home Care Screening Complete  Medication Review (RN CM) Complete  Some recent data might be hidden

## 2018-08-29 NOTE — Progress Notes (Signed)
CRITICAL VALUE ALERT  Critical Value:  CA 6.4  Date & Time Notied: 08/29/2018 6:44   Provider Notified: Dr. Phylis Bougie   Orders Received/Actions taken: no new orders at this time.

## 2018-08-29 NOTE — Progress Notes (Signed)
Patient signed towards call returned. He was updated on his mother's condition. Patient states she is feeling better. I examined her meal tray and she ate more than 60% of her lunch. Has not had diarrhea in over 2 days. Patient counseled for the urgent need for her to quit cigarette smoking.  She has not smoked for 4 days and has done well.  She needs to build up on it. Will plan to see him in the office in 1 month.

## 2018-08-29 NOTE — Plan of Care (Signed)

## 2018-08-29 NOTE — Progress Notes (Signed)
SATURATION QUALIFICATIONS: (This note is used to comply with regulatory documentation for home oxygen)  Patient Saturations on Room Air at Rest = 93%  Patient Saturations on Room Air while Ambulating = 91%  Patient Saturations on 2Liters of oxygen while Ambulating = 93%  Please briefly explain why patient needs home oxygen:

## 2018-08-29 NOTE — Discharge Summary (Signed)
Physician Discharge Summary  Patient ID: [name] MRN: [id number] DOB/AGE: [birth year] 83 y.o. Primary Care Physician:Jericha Bryden, Percell Miller, MD Admit date: 08/25/2018 Discharge date: 08/29/2018    Discharge Diagnoses:   Principal Problem:   Hypoxia Active Problems:   Essential hypertension   COPD (chronic obstructive pulmonary disease) (HCC)   Hyponatremia   Hypokalemia   Malnutrition of moderate degree Hypocalcemia  Allergies as of 08/29/2018      Reactions   Aleve [naproxen Sodium] Hives, Palpitations   Hydrocodone Nausea Only   Statins Other (See Comments)   Muscle pain   Celebrex [celecoxib] Nausea And Vomiting   Codeine Nausea And Vomiting   Morphine And Related Nausea And Vomiting      Medication List    STOP taking these medications   levofloxacin 500 MG tablet Commonly known as: LEVAQUIN     TAKE these medications   aspirin EC 81 MG tablet Take 81 mg by mouth daily.   CALCIUM 600 + D PO Take 1-2 tablets by mouth See admin instructions. 2 tabs in the morning, and 1 tab at Noon   diltiazem 240 MG 24 hr capsule Commonly known as: CARDIZEM CD TAKE 1 CAPSULE(240 MG) BY MOUTH DAILY   diphenhydrAMINE 25 mg capsule Commonly known as: BENADRYL Take 25 mg by mouth at bedtime.   DULoxetine 30 MG capsule Commonly known as: CYMBALTA Take 30 mg by mouth daily.   Flaxseed Oil 1000 MG Caps Take 1 capsule by mouth 2 (two) times daily.   metroNIDAZOLE 250 MG tablet Commonly known as: FLAGYL Take 1 tablet (250 mg total) by mouth 3 (three) times daily after meals for 5 days.   multivitamin with minerals Tabs tablet Take 1 tablet by mouth daily.   niacin 500 MG CR capsule Take 500 mg by mouth at bedtime.   Omega-3 1000 MG Caps Take 1 capsule by mouth daily.   pantoprazole 40 MG tablet Commonly known as: PROTONIX Take 1 tablet (40 mg total) by mouth daily.   traMADol 50 MG tablet Commonly known as: ULTRAM Take 1 tablet (50 mg total) by mouth every 6 (six)  hours as needed for moderate pain.   vitamin C 1000 MG tablet Take 1,000 mg by mouth 2 (two) times daily.       Discharged Condition: Improved    Consults: Gastroenterology  Significant Diagnostic Studies: Nm Pulmonary Perf And Vent  Result Date: 08/26/2018 CLINICAL DATA:  Nausea, elevated D-dimer, question pulmonary embolism, intermediate clinical probability for PE; history COPD, hypertension, stroke EXAM: NUCLEAR MEDICINE VENTILATION - PERFUSION LUNG SCAN TECHNIQUE: Ventilation images were obtained in multiple projections using inhaled aerosol Tc-79m DTPA. Perfusion images were obtained in multiple projections after intravenous injection of Tc-75m MAA. RADIOPHARMACEUTICALS:  19 mCi of Tc-68m DTPA aerosol inhalation and 1.75 mCi Tc21m MAA IV COMPARISON:  None Correlation: Chest radiograph 08/26/2018 FINDINGS: Ventilation: Inhomogeneous peripheral ventilation throughout the mid to lower RIGHT lung. Subsegmental defects are identified at the lingula and RIGHT middle lobe. Perfusion: Matching perfusion defects at lingula and RIGHT middle lobe. Inhomogeneous perfusion peripherally in the RIGHT lung matching ventilatory findings. Chest radiograph: COPD changes with RIGHT basilar atelectasis and question minimal LEFT basilar atelectasis. IMPRESSION: Matching ventilation and perfusion abnormalities in the RIGHT middle lobe and lingula. Inhomogeneous ventilation and perfusion in the periphery of the RIGHT lung consistent with emphysematous changes and COPD seen on chest radiograph. Low probability for pulmonary embolism. Electronically Signed   By: Lavonia Dana M.D.   On: 08/26/2018 11:11   Dg  Chest Portable 1 View  Result Date: 08/26/2018 CLINICAL DATA:  Hypoxia. Increased shortness of breath with exertion. EXAM: PORTABLE CHEST 1 VIEW COMPARISON:  06/30/2018 and 06/26/2018 FINDINGS: The heart size is normal. Pulmonary vascularity is at the upper limits of normal. No infiltrates or effusions. No acute  bone abnormality. S old healed left rib fracture. Severe arthritic changes of the right shoulder. Left shoulder prosthesis. IMPRESSION: No acute cardiopulmonary disease. Electronically Signed   By: Lorriane Shire M.D.   On: 08/26/2018 05:21   Ct Angio Abd/pel W And/or Wo Contrast  Result Date: 08/26/2018 CLINICAL DATA:  Diarrhea concern for mesenteric ischemia EXAM: CTA ABDOMEN AND PELVIS wITHOUT AND WITH CONTRAST TECHNIQUE: Multidetector CT imaging of the abdomen and pelvis was performed using the standard protocol during bolus administration of intravenous contrast. Multiplanar reconstructed images and MIPs were obtained and reviewed to evaluate the vascular anatomy. CONTRAST:  128mL OMNIPAQUE IOHEXOL 350 MG/ML SOLN COMPARISON:  CT angiography 01/01/2018, CT 12/02/2016 FINDINGS: VASCULAR Aorta: Extensive aortic atherosclerosis with mild irregular luminal narrowing of the proximal infrarenal abdominal aorta but no acute occlusion. No dissection. No aneurysm. Celiac: Calcification at the origin without significant stenosis. Branch vessels appear patent. 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 Diseased distal SMA but with flow enhancement present. Vessels appear diffusely diminutive. Renals: 2 right and single left renal arteries. Prominent calcification at origin of left renal artery without significant stenosis. Calcification at the origins of both right renal arteries with suspected mild-to-moderate stenosis at the origins. IMA: Diminutive in size but with flow enhancement present. Inflow: Moderate to marked atherosclerosis of the bilateral common iliac and external iliac arteries. Moderately diseased internal iliac arteries. Proximal Outflow: Atherosclerotic calcifications.  No occlusions. Veins: No obvious venous abnormality within the limitations of this arterial phase study. Review of the MIP images confirms the above findings. NON-VASCULAR  Lower chest: Lung bases demonstrate no acute consolidation or effusion. Hepatobiliary: No focal liver abnormality is seen. Status post cholecystectomy. No biliary dilatation. Pancreas: Unremarkable. No pancreatic ductal dilatation or surrounding inflammatory changes. Spleen: Normal in size without focal abnormality. Adrenals/Urinary Tract: Adrenal glands are unremarkable. Kidneys are normal, without renal calculi, focal lesion, or hydronephrosis. Bladder is unremarkable. Stomach/Bowel: Stomach is nonenlarged. Fluid-filled small and large bowel without bowel wall thickening. Sigmoid colon diverticula without acute inflammatory change. Lymphatic: No significantly enlarged lymph nodes Reproductive: Status post hysterectomy. No adnexal masses. Other: Negative for free air or free fluid Musculoskeletal: Subacute to chronic right inferior pubic ramus fracture. Intramedullary rod within the right femur. Advanced degenerative changes of the spine. Chronic compression deformities T11-T12 and L1. IMPRESSION: VASCULAR 1. Stable chronic occlusion of the SMA. There is distal patency of the SMA. IMA appears diminutive but is grossly patent. Mesenteric vessels appear diffusely attenuated. 2. Extensive aortic atherosclerosis of the abdominal aorta and branch vessels without high-grade stenosis or acute occlusive disease. NON-VASCULAR 1. Negative for bowel wall thickening or acute colitis. Diffuse fluid within the small and large bowel, possibly due to enteritis 2. Healing right inferior pubic ramus fracture 3. Sigmoid colon diverticular disease without acute inflammatory process Electronically Signed   By: Donavan Foil M.D.   On: 08/26/2018 03:24    Lab Results: Basic Metabolic Panel: Recent Labs    08/28/18 0716 08/29/18 0446  NA 130* 128*  K 3.2* 3.9  CL 97* 96*  CO2 24 20*  GLUCOSE 95 79  BUN 15 18  CREATININE 0.53 0.69  CALCIUM 6.1* 6.4*  Liver Function Tests: Recent Labs    08/27/18 0628 08/28/18 1142  08/29/18 0446  AST 20  --  15  ALT 29  --  19  ALKPHOS 63  --  66  BILITOT 1.0  --  0.6  PROT 5.5*  --  5.3*  ALBUMIN 3.1* 2.9* 2.8*     CBC: Recent Labs    08/27/18 0628 08/28/18 0716  WBC 18.0* 14.1*  HGB 12.3 11.9*  HCT 36.3 35.0*  MCV 91.0 91.9  PLT 343 302    Recent Results (from the past 240 hour(s))  C difficile quick scan w PCR reflex     Status: None   Collection Time: 08/25/18 11:21 PM   Specimen: Stool  Result Value Ref Range Status   C Diff antigen NEGATIVE NEGATIVE Final   C Diff toxin NEGATIVE NEGATIVE Final   C Diff interpretation No C. difficile detected.  Final    Comment: Performed at [facility], [address], [place], [place] [address]  SARS Coronavirus 2 ([place] - Performed in [place] hospital lab), Hosp Order     Status: None   Collection Time: 08/26/18  5:53 AM   Specimen: Nasopharyngeal Swab  Result Value Ref Range Status   SARS Coronavirus 2 NEGATIVE NEGATIVE Final    Comment: (NOTE) If result is NEGATIVE SARS-CoV-2 target nucleic acids are NOT DETECTED. The SARS-CoV-2 RNA is generally detectable in upper and lower  respiratory specimens during the acute phase of infection. The lowest  concentration of SARS-CoV-2 viral copies this assay can detect is 250  copies / mL. A negative result does not preclude SARS-CoV-2 infection  and should not be used as the sole basis for treatment or other  patient management decisions.  A negative result may occur with  improper specimen collection / handling, submission of specimen other  than nasopharyngeal swab, presence of viral mutation(s) within the  areas targeted by this assay, and inadequate number of viral copies  (<250 copies / mL). A negative result must be combined with clinical  observations, patient history, and epidemiological information. If result is POSITIVE SARS-CoV-2 target nucleic acids are DETECTED. The SARS-CoV-2 RNA is generally detectable in upper and lower  respiratory  specimens dur ing the acute phase of infection.  Positive  results are indicative of active infection with SARS-CoV-2.  Clinical  correlation with patient history and other diagnostic information is  necessary to determine patient infection status.  Positive results do  not rule out bacterial infection or co-infection with other viruses. If result is PRESUMPTIVE POSTIVE SARS-CoV-2 nucleic acids MAY BE PRESENT.   A presumptive positive result was obtained on the submitted specimen  and confirmed on repeat testing.  While 2019 novel coronavirus  (SARS-CoV-2) nucleic acids may be present in the submitted sample  additional confirmatory testing may be necessary for epidemiological  and / or clinical management purposes  to differentiate between  SARS-CoV-2 and other Sarbecovirus currently known to infect humans.  If clinically indicated additional testing with an alternate test  methodology [phone]) is advised. The SARS-CoV-2 RNA is generally  detectable in upper and lower respiratory sp ecimens during the acute  phase of infection. The expected result is Negative. Fact Sheet for Patients:  StrictlyIdeas.no Fact Sheet for Healthcare Providers: BankingDealers.co.za This test is not yet approved or cleared by the [place] FDA and has been authorized for detection and/or diagnosis of SARS-CoV-2 by FDA under an Emergency Use Authorization (EUA).  This EUA will remain in effect (meaning  this test can be used) for the duration of the COVID-19 declaration under Section 564(b)(1) of the Act, 21 U.S.C. section 360bbb-3(b)(1), unless the authorization is terminated or revoked sooner. Performed at [facility], [address] [place], [place] [address]   Gastrointestinal Panel by PCR , Stool     Status: None   Collection Time: 08/26/18 11:33 PM   Specimen: Stool  Result Value Ref Range Status   Campylobacter species NOT DETECTED NOT DETECTED  Final   Plesimonas shigelloides NOT DETECTED NOT DETECTED Final   Salmonella species NOT DETECTED NOT DETECTED Final   Yersinia enterocolitica NOT DETECTED NOT DETECTED Final   Vibrio species NOT DETECTED NOT DETECTED Final   Vibrio cholerae NOT DETECTED NOT DETECTED Final   Enteroaggregative E coli (EAEC) NOT DETECTED NOT DETECTED Final   Enteropathogenic E coli (EPEC) NOT DETECTED NOT DETECTED Final   Enterotoxigenic E coli (ETEC) NOT DETECTED NOT DETECTED Final   Shiga like toxin producing E coli (STEC) NOT DETECTED NOT DETECTED Final   Shigella/Enteroinvasive E coli (EIEC) NOT DETECTED NOT DETECTED Final   Cryptosporidium NOT DETECTED NOT DETECTED Final   Cyclospora cayetanensis NOT DETECTED NOT DETECTED Final   Entamoeba histolytica NOT DETECTED NOT DETECTED Final   Giardia lamblia NOT DETECTED NOT DETECTED Final   Adenovirus F40/41 NOT DETECTED NOT DETECTED Final   Astrovirus NOT DETECTED NOT DETECTED Final   Norovirus GI/GII NOT DETECTED NOT DETECTED Final   Rotavirus A NOT DETECTED NOT DETECTED Final   Sapovirus (I, II, IV, and V) NOT DETECTED NOT DETECTED Final    Comment: Performed at [facility], [address], [place], [place] [address]     Hospital Course: This is an 83 year old who is known to have chronic mesenteric ischemia and who came to the emergency department with increasing abdominal pain nausea.  She was also hypoxic.  She does have COPD at baseline.  It was felt that her problem was likely related to her mesenteric ischemia but she did have a enteric pathogen panel that was negative.  She improved and by 08/29/2018 was ready for discharge.  Her son had wanted her to go to a skilled care facility but she refuses.  She had hypocalcemia during her hospitalization which was treated.  Discharge Exam: Blood pressure 133/71, pulse 69, temperature 98.5 F (36.9 C), temperature source Oral, resp. rate 18, weight 49.7 kg, SpO2 91 %. She is awake and alert.  Chest  is clear.  Heart is regular.  Disposition: Home with home health services.  We are testing to see if she needs home oxygen.  Discharge Instructions    Face-to-face encounter (required for Medicare/Medicaid patients)   Complete by: As directed    I Alonza Bogus certify that this patient is under my care and that I, or a nurse practitioner or physician's assistant working with me, had a face-to-face encounter that meets the physician face-to-face encounter requirements with this patient on 08/29/2018. The encounter with the patient was in whole, or in part for the following medical condition(s) which is the primary reason for home health care (List medical condition): mesenteric ischemia   The encounter with the patient was in whole, or in part, for the following medical condition, which is the primary reason for home health care: mesenteric ischemia   I certify that, based on my findings, the following services are medically necessary home health services:  Nursing Physical therapy     Reason for Medically Necessary Home Health Services: Skilled Nursing-  Change/Decline in Patient Status   My clinical findings support the need for the above services: Unable to leave home safely without assistance and/or assistive device   Further, I certify that my clinical findings support that this patient is homebound due to: Unable to leave home safely without assistance   Home Health   Complete by: As directed    To provide the following care/treatments:  RN PT        Follow-up Information    Sinda Du, MD Follow up.   Specialty: Pulmonary Disease Contact information: [address] [place] [address] [phone]           Signed: Alonza Bogus   08/29/2018, 9:03 AM

## 2018-08-29 NOTE — Progress Notes (Signed)
Subjective: She says she feels substantially better and wants to go home.  No new complaints.  Her diarrhea has resolved.  She was able to eat yesterday.  Objective: Vital signs in last 24 hours: Temp:  [98.5 F (36.9 C)-98.9 F (37.2 C)] 98.5 F (36.9 C) (07/09 0509) Pulse Rate:  [61-83] 69 (07/09 0509) Resp:  [16-18] 18 (07/09 0509) BP: (133-184)/(62-71) 133/71 (07/09 0509) SpO2:  [91 %-95 %] 91 % (07/09 0743) Weight:  [49.7 kg] 49.7 kg (07/09 0509) Weight change:  Last BM Date: 08/28/18  Intake/Output from previous day: 07/08 0701 - 07/09 0700 In: 720 [P.O.:720] Out: -   PHYSICAL EXAM General appearance: alert, cooperative and no distress Resp: clear to auscultation bilaterally Cardio: regular rate and rhythm, S1, S2 normal, no murmur, click, rub or gallop GI: soft, non-tender; bowel sounds normal; no masses,  no organomegaly Extremities: extremities normal, atraumatic, no cyanosis or edema  Lab Results:  Results for orders placed or performed during the hospital encounter of 08/25/18 (from the past 48 hour(s))  CBC     Status: Abnormal   Collection Time: 08/28/18  7:16 AM  Result Value Ref Range   WBC 14.1 (H) 4.0 - 10.5 K/uL   RBC 3.81 (L) 3.87 - 5.11 MIL/uL   Hemoglobin 11.9 (L) 12.0 - 15.0 g/dL   HCT 35.0 (L) 36.0 - 46.0 %   MCV 91.9 80.0 - 100.0 fL   MCH 31.2 26.0 - 34.0 pg   MCHC 34.0 30.0 - 36.0 g/dL   RDW 13.2 11.5 - 15.5 %   Platelets 302 150 - 400 K/uL   nRBC 0.0 0.0 - 0.2 %    Comment: Performed at [facility], [address] [place], [place] [address]  Basic metabolic panel     Status: Abnormal   Collection Time: 08/28/18  7:16 AM  Result Value Ref Range   Sodium 130 (L) 135 - 145 mmol/L   Potassium 3.2 (L) 3.5 - 5.1 mmol/L   Chloride 97 (L) 98 - 111 mmol/L   CO2 24 22 - 32 mmol/L   Glucose, Bld 95 70 - 99 mg/dL   BUN 15 8 - 23 mg/dL   Creatinine, Ser 0.53 0.44 - 1.00 mg/dL   Calcium 6.1 (LL) 8.9 - 10.3 mg/dL    Comment: CRITICAL RESULT CALLED  TO, READ BACK BY AND VERIFIED WITH: JACKSON,N AT 8:20AM ON 08/28/18 BY FESTERMAN,C    GFR calc non Af Amer >60 >60 mL/min   GFR calc Af Amer >60 >60 mL/min   Anion gap 9 5 - 15    Comment: Performed at [facility], [address], [place], [place] [address]  Albumin     Status: Abnormal   Collection Time: 08/28/18 11:42 AM  Result Value Ref Range   Albumin 2.9 (L) 3.5 - 5.0 g/dL    Comment: Performed at [facility], [address], [place], [place] [address]  Basic metabolic panel     Status: Abnormal   Collection Time: 08/29/18  4:46 AM  Result Value Ref Range   Sodium 128 (L) 135 - 145 mmol/L   Potassium 3.9 3.5 - 5.1 mmol/L    Comment: DELTA CHECK NOTED   Chloride 96 (L) 98 - 111 mmol/L   CO2 20 (L) 22 - 32 mmol/L   Glucose, Bld 79 70 - 99 mg/dL   BUN 18 8 - 23 mg/dL   Creatinine, Ser 0.69 0.44 - 1.00 mg/dL   Calcium 6.4 (LL) 8.9 - 10.3 mg/dL  Comment: CRITICAL RESULT CALLED TO, READ BACK BY AND VERIFIED WITH: BYNES,P AT 6:30AM ON 08/29/18 BY FESTERMAN,C    GFR calc non Af Amer >60 >60 mL/min   GFR calc Af Amer >60 >60 mL/min   Anion gap 12 5 - 15    Comment: Performed at [facility], [address] [place], [name] [address]  Hepatic function panel     Status: Abnormal   Collection Time: 08/29/18  4:46 AM  Result Value Ref Range   Total Protein 5.3 (L) 6.5 - 8.1 g/dL   Albumin 2.8 (L) 3.5 - 5.0 g/dL   AST 15 15 - 41 U/L   ALT 19 0 - 44 U/L   Alkaline Phosphatase 66 38 - 126 U/L   Total Bilirubin 0.6 0.3 - 1.2 mg/dL   Bilirubin, Direct 0.2 0.0 - 0.2 mg/dL   Indirect Bilirubin 0.4 0.3 - 0.9 mg/dL    Comment: Performed at [facility], [address], [place], [place] [address]    ABGS No results for input(s): PHART, PO2ART, TCO2, HCO3 in the last 72 hours.  Invalid input(s): PCO2 CULTURES Recent Results (from the past 240 hour(s))  C difficile quick scan w PCR reflex     Status: None   Collection Time: 08/25/18 11:21 PM   Specimen: Stool  Result Value Ref  Range Status   C Diff antigen NEGATIVE NEGATIVE Final   C Diff toxin NEGATIVE NEGATIVE Final   C Diff interpretation No C. difficile detected.  Final    Comment: Performed at [facility], [address] [place], [place] [address]  SARS Coronavirus 2 ([place] - Performed in [place] hospital lab), Hosp Order     Status: None   Collection Time: 08/26/18  5:53 AM   Specimen: Nasopharyngeal Swab  Result Value Ref Range Status   SARS Coronavirus 2 NEGATIVE NEGATIVE Final    Comment: (NOTE) If result is NEGATIVE SARS-CoV-2 target nucleic acids are NOT DETECTED. The SARS-CoV-2 RNA is generally detectable in upper and lower  respiratory specimens during the acute phase of infection. The lowest  concentration of SARS-CoV-2 viral copies this assay can detect is 250  copies / mL. A negative result does not preclude SARS-CoV-2 infection  and should not be used as the sole basis for treatment or other  patient management decisions.  A negative result may occur with  improper specimen collection / handling, submission of specimen other  than nasopharyngeal swab, presence of viral mutation(s) within the  areas targeted by this assay, and inadequate number of viral copies  (<250 copies / mL). A negative result must be combined with clinical  observations, patient history, and epidemiological information. If result is POSITIVE SARS-CoV-2 target nucleic acids are DETECTED. The SARS-CoV-2 RNA is generally detectable in upper and lower  respiratory specimens dur ing the acute phase of infection.  Positive  results are indicative of active infection with SARS-CoV-2.  Clinical  correlation with patient history and other diagnostic information is  necessary to determine patient infection status.  Positive results do  not rule out bacterial infection or co-infection with other viruses. If result is PRESUMPTIVE POSTIVE SARS-CoV-2 nucleic acids MAY BE PRESENT.   A presumptive positive result was  obtained on the submitted specimen  and confirmed on repeat testing.  While 2019 novel coronavirus  (SARS-CoV-2) nucleic acids may be present in the submitted sample  additional confirmatory testing may be necessary for epidemiological  and / or clinical management purposes  to differentiate between  SARS-CoV-2 and other Sarbecovirus currently known to infect humans.  If clinically indicated additional testing with an alternate test  methodology [phone]) is advised. The SARS-CoV-2 RNA is generally  detectable in upper and lower respiratory sp ecimens during the acute  phase of infection. The expected result is Negative. Fact Sheet for Patients:  StrictlyIdeas.no Fact Sheet for Healthcare Providers: BankingDealers.co.za This test is not yet approved or cleared by the [place] FDA and has been authorized for detection and/or diagnosis of SARS-CoV-2 by FDA under an Emergency Use Authorization (EUA).  This EUA will remain in effect (meaning this test can be used) for the duration of the COVID-19 declaration under Section 564(b)(1) of the Act, 21 U.S.C. section 360bbb-3(b)(1), unless the authorization is terminated or revoked sooner. Performed at [facility], [address] [place], [place] [address]   Gastrointestinal Panel by PCR , Stool     Status: None   Collection Time: 08/26/18 11:33 PM   Specimen: Stool  Result Value Ref Range Status   Campylobacter species NOT DETECTED NOT DETECTED Final   Plesimonas shigelloides NOT DETECTED NOT DETECTED Final   Salmonella species NOT DETECTED NOT DETECTED Final   Yersinia enterocolitica NOT DETECTED NOT DETECTED Final   Vibrio species NOT DETECTED NOT DETECTED Final   Vibrio cholerae NOT DETECTED NOT DETECTED Final   Enteroaggregative E coli (EAEC) NOT DETECTED NOT DETECTED Final   Enteropathogenic E coli (EPEC) NOT DETECTED NOT DETECTED Final   Enterotoxigenic E coli (ETEC) NOT DETECTED  NOT DETECTED Final   Shiga like toxin producing E coli (STEC) NOT DETECTED NOT DETECTED Final   Shigella/Enteroinvasive E coli (EIEC) NOT DETECTED NOT DETECTED Final   Cryptosporidium NOT DETECTED NOT DETECTED Final   Cyclospora cayetanensis NOT DETECTED NOT DETECTED Final   Entamoeba histolytica NOT DETECTED NOT DETECTED Final   Giardia lamblia NOT DETECTED NOT DETECTED Final   Adenovirus F40/41 NOT DETECTED NOT DETECTED Final   Astrovirus NOT DETECTED NOT DETECTED Final   Norovirus GI/GII NOT DETECTED NOT DETECTED Final   Rotavirus A NOT DETECTED NOT DETECTED Final   Sapovirus (I, II, IV, and V) NOT DETECTED NOT DETECTED Final    Comment: Performed at [facility], [address], [place], [place] [address]   Studies/Results: No results found.  Medications:  Prior to Admission:  Medications Prior to Admission  Medication Sig Dispense Refill Last Dose  . Ascorbic Acid (VITAMIN C) 1000 MG tablet Take 1,000 mg by mouth 2 (two) times daily.    08/25/2018 at Unknown time  . aspirin EC 81 MG tablet Take 81 mg by mouth daily.   08/25/2018 at 900  . Calcium Carb-Cholecalciferol (CALCIUM 600 + D PO) Take 1-2 tablets by mouth See admin instructions. 2 tabs in the morning, and 1 tab at [facility]   08/25/2018 at Unknown time  . diltiazem (CARDIZEM CD) 240 MG 24 hr capsule TAKE 1 CAPSULE(240 MG) BY MOUTH DAILY 60 capsule 0 08/25/2018 at Unknown time  . diphenhydrAMINE (BENADRYL) 25 mg capsule Take 25 mg by mouth at bedtime.   Past Week at Unknown time  . DULoxetine (CYMBALTA) 30 MG capsule Take 30 mg by mouth daily.    08/25/2018 at Unknown time  . Flaxseed, Linseed, (FLAXSEED OIL) 1000 MG CAPS Take 1 capsule by mouth 2 (two) times daily.   08/25/2018 at Unknown time  . Multiple Vitamin (MULTIVITAMIN WITH MINERALS) TABS Take 1 tablet by mouth daily.   08/25/2018 at Unknown time  . niacin 500 MG CR  capsule Take 500 mg by mouth at bedtime.   08/25/2018 at Unknown time  . Omega-3 1000 MG CAPS Take 1 capsule by  mouth daily.   08/25/2018 at Unknown time  . pantoprazole (PROTONIX) 40 MG tablet Take 1 tablet (40 mg total) by mouth daily. 30 tablet 11 08/25/2018 at Unknown time  . traMADol (ULTRAM) 50 MG tablet Take 1 tablet (50 mg total) by mouth every 6 (six) hours as needed for moderate pain. 20 tablet 1 Past Week at Unknown time  . levofloxacin (LEVAQUIN) 500 MG tablet Take 1 tablet (500 mg total) by mouth daily at 6 PM. (Patient not taking: Reported on 08/26/2018) 3 tablet 0 Completed Course at Unknown time   Scheduled: . aspirin EC  81 mg Oral Daily  . diltiazem  240 mg Oral Daily  . diphenhydrAMINE  25 mg Oral QHS  . DULoxetine  30 mg Oral Daily  . enoxaparin (LOVENOX) injection  40 mg Subcutaneous Q24H  . feeding supplement  1 Container Oral TID BM  . metroNIDAZOLE  250 mg Oral TID PC  . multivitamin with minerals  1 tablet Oral Daily  . nicotine  7 mg Transdermal Daily  . pantoprazole  40 mg Oral Daily  . sodium chloride flush  3 mL Intravenous Once  . umeclidinium bromide  1 puff Inhalation Daily   Continuous: . 0.9 % NaCl with KCl 20 mEq / L 50 mL/hr at 08/27/18 0959  . calcium gluconate     RUE:[id number] **OR** acetaminophen, levalbuterol, loperamide, ondansetron (ZOFRAN) IV, promethazine, traMADol  Assesment: She was admitted with abdominal pain.  She is known to have mesenteric ischemia.  She had hyponatremia and hypokalemia and both of those are better.  She is not having any abdominal pain now.  Her calcium level has been low and is being replaced.  She has malnutrition from her chronic ischemic bowel.  She feels substantially better. Principal Problem:   Hypoxia Active Problems:   Essential hypertension   COPD (chronic obstructive pulmonary disease) (HCC)   Hyponatremia   Hypokalemia   Malnutrition of moderate degree    Plan: For discharge home today home health services    LOS: 2 days   Alonza Bogus 08/29/2018, 8:39 AM

## 2018-08-29 NOTE — Progress Notes (Signed)
Per SW: Pt does not qualify for home 02. Pt does not want home 02. Discussed with son. Son Sherren Mocha) declined 02 (out of pocket expense). Dr. Luan Pulling notified.

## 2018-08-30 LAB — VITAMIN D 25 HYDROXY (VIT D DEFICIENCY, FRACTURES): Vit D, 25-Hydroxy: 26 ng/mL — ABNORMAL LOW (ref 30.0–100.0)

## 2018-09-01 DIAGNOSIS — J189 Pneumonia, unspecified organism: Secondary | ICD-10-CM | POA: Diagnosis not present

## 2018-09-01 DIAGNOSIS — Z87891 Personal history of nicotine dependence: Secondary | ICD-10-CM | POA: Diagnosis not present

## 2018-09-01 DIAGNOSIS — E039 Hypothyroidism, unspecified: Secondary | ICD-10-CM | POA: Diagnosis not present

## 2018-09-01 DIAGNOSIS — I700 Atherosclerosis of aorta: Secondary | ICD-10-CM | POA: Diagnosis not present

## 2018-09-01 DIAGNOSIS — M1990 Unspecified osteoarthritis, unspecified site: Secondary | ICD-10-CM | POA: Diagnosis not present

## 2018-09-01 DIAGNOSIS — Z8673 Personal history of transient ischemic attack (TIA), and cerebral infarction without residual deficits: Secondary | ICD-10-CM | POA: Diagnosis not present

## 2018-09-01 DIAGNOSIS — F419 Anxiety disorder, unspecified: Secondary | ICD-10-CM | POA: Diagnosis not present

## 2018-09-01 DIAGNOSIS — Z7982 Long term (current) use of aspirin: Secondary | ICD-10-CM | POA: Diagnosis not present

## 2018-09-01 DIAGNOSIS — K55059 Acute (reversible) ischemia of intestine, part and extent unspecified: Secondary | ICD-10-CM | POA: Diagnosis not present

## 2018-09-01 DIAGNOSIS — J449 Chronic obstructive pulmonary disease, unspecified: Secondary | ICD-10-CM | POA: Diagnosis not present

## 2018-09-01 DIAGNOSIS — I739 Peripheral vascular disease, unspecified: Secondary | ICD-10-CM | POA: Diagnosis not present

## 2018-09-01 DIAGNOSIS — I10 Essential (primary) hypertension: Secondary | ICD-10-CM | POA: Diagnosis not present

## 2018-09-01 DIAGNOSIS — E440 Moderate protein-calorie malnutrition: Secondary | ICD-10-CM | POA: Diagnosis not present

## 2018-09-01 DIAGNOSIS — Z96612 Presence of left artificial shoulder joint: Secondary | ICD-10-CM | POA: Diagnosis not present

## 2018-09-01 DIAGNOSIS — F329 Major depressive disorder, single episode, unspecified: Secondary | ICD-10-CM | POA: Diagnosis not present

## 2018-09-02 ENCOUNTER — Emergency Department (HOSPITAL_COMMUNITY)
Admission: EM | Admit: 2018-09-02 | Discharge: 2018-09-05 | Disposition: A | Discharge status: Skilled Nursing Facility | Payer: PPO | Attending: Emergency Medicine | Admitting: Emergency Medicine

## 2018-09-02 ENCOUNTER — Encounter (HOSPITAL_COMMUNITY): Payer: Self-pay | Admitting: Emergency Medicine

## 2018-09-02 ENCOUNTER — Other Ambulatory Visit: Payer: Self-pay

## 2018-09-02 ENCOUNTER — Emergency Department (HOSPITAL_COMMUNITY): Payer: PPO

## 2018-09-02 DIAGNOSIS — F322 Major depressive disorder, single episode, severe without psychotic features: Secondary | ICD-10-CM | POA: Diagnosis not present

## 2018-09-02 DIAGNOSIS — R45851 Suicidal ideations: Secondary | ICD-10-CM | POA: Diagnosis not present

## 2018-09-02 DIAGNOSIS — Z20828 Contact with and (suspected) exposure to other viral communicable diseases: Secondary | ICD-10-CM | POA: Diagnosis not present

## 2018-09-02 DIAGNOSIS — Z87891 Personal history of nicotine dependence: Secondary | ICD-10-CM | POA: Insufficient documentation

## 2018-09-02 DIAGNOSIS — Z8673 Personal history of transient ischemic attack (TIA), and cerebral infarction without residual deficits: Secondary | ICD-10-CM | POA: Diagnosis not present

## 2018-09-02 DIAGNOSIS — I2510 Atherosclerotic heart disease of native coronary artery without angina pectoris: Secondary | ICD-10-CM | POA: Insufficient documentation

## 2018-09-02 DIAGNOSIS — Z96612 Presence of left artificial shoulder joint: Secondary | ICD-10-CM | POA: Diagnosis not present

## 2018-09-02 DIAGNOSIS — J449 Chronic obstructive pulmonary disease, unspecified: Secondary | ICD-10-CM | POA: Diagnosis not present

## 2018-09-02 DIAGNOSIS — E860 Dehydration: Secondary | ICD-10-CM | POA: Diagnosis not present

## 2018-09-02 DIAGNOSIS — Z7982 Long term (current) use of aspirin: Secondary | ICD-10-CM | POA: Diagnosis not present

## 2018-09-02 DIAGNOSIS — R110 Nausea: Secondary | ICD-10-CM | POA: Diagnosis not present

## 2018-09-02 DIAGNOSIS — I10 Essential (primary) hypertension: Secondary | ICD-10-CM | POA: Diagnosis not present

## 2018-09-02 DIAGNOSIS — E039 Hypothyroidism, unspecified: Secondary | ICD-10-CM | POA: Insufficient documentation

## 2018-09-02 DIAGNOSIS — R109 Unspecified abdominal pain: Secondary | ICD-10-CM | POA: Diagnosis not present

## 2018-09-02 DIAGNOSIS — Z03818 Encounter for observation for suspected exposure to other biological agents ruled out: Secondary | ICD-10-CM | POA: Insufficient documentation

## 2018-09-02 DIAGNOSIS — Z79899 Other long term (current) drug therapy: Secondary | ICD-10-CM | POA: Diagnosis not present

## 2018-09-02 DIAGNOSIS — Z96653 Presence of artificial knee joint, bilateral: Secondary | ICD-10-CM | POA: Diagnosis not present

## 2018-09-02 DIAGNOSIS — G8929 Other chronic pain: Secondary | ICD-10-CM | POA: Diagnosis not present

## 2018-09-02 DIAGNOSIS — N3000 Acute cystitis without hematuria: Secondary | ICD-10-CM

## 2018-09-02 DIAGNOSIS — Z9049 Acquired absence of other specified parts of digestive tract: Secondary | ICD-10-CM | POA: Insufficient documentation

## 2018-09-02 DIAGNOSIS — J90 Pleural effusion, not elsewhere classified: Secondary | ICD-10-CM | POA: Diagnosis not present

## 2018-09-02 DIAGNOSIS — R197 Diarrhea, unspecified: Secondary | ICD-10-CM | POA: Diagnosis not present

## 2018-09-02 LAB — CBC WITH DIFFERENTIAL/PLATELET
Abs Immature Granulocytes: 0.07 10*3/uL (ref 0.00–0.07)
Basophils Absolute: 0 10*3/uL (ref 0.0–0.1)
Basophils Relative: 0 %
Eosinophils Absolute: 0 10*3/uL (ref 0.0–0.5)
Eosinophils Relative: 0 %
HCT: 35.7 % — ABNORMAL LOW (ref 36.0–46.0)
Hemoglobin: 12.6 g/dL (ref 12.0–15.0)
Immature Granulocytes: 1 %
Lymphocytes Relative: 13 %
Lymphs Abs: 1 10*3/uL (ref 0.7–4.0)
MCH: 31 pg (ref 26.0–34.0)
MCHC: 35.3 g/dL (ref 30.0–36.0)
MCV: 87.9 fL (ref 80.0–100.0)
Monocytes Absolute: 0.5 10*3/uL (ref 0.1–1.0)
Monocytes Relative: 6 %
Neutro Abs: 6.3 10*3/uL (ref 1.7–7.7)
Neutrophils Relative %: 80 %
Platelets: 411 10*3/uL — ABNORMAL HIGH (ref 150–400)
RBC: 4.06 MIL/uL (ref 3.87–5.11)
RDW: 13.3 % (ref 11.5–15.5)
WBC: 7.8 10*3/uL (ref 4.0–10.5)
nRBC: 0 % (ref 0.0–0.2)

## 2018-09-02 LAB — COMPREHENSIVE METABOLIC PANEL
ALT: 42 U/L (ref 0–44)
AST: 37 U/L (ref 15–41)
Albumin: 3.2 g/dL — ABNORMAL LOW (ref 3.5–5.0)
Alkaline Phosphatase: 59 U/L (ref 38–126)
Anion gap: 11 (ref 5–15)
BUN: 13 mg/dL (ref 8–23)
CO2: 25 mmol/L (ref 22–32)
Calcium: 6.4 mg/dL — CL (ref 8.9–10.3)
Chloride: 93 mmol/L — ABNORMAL LOW (ref 98–111)
Creatinine, Ser: 0.55 mg/dL (ref 0.44–1.00)
GFR calc Af Amer: >60 mL/min (ref >60)
GFR calc non Af Amer: >60 mL/min (ref >60)
Glucose, Bld: 91 mg/dL (ref 70–99)
Potassium: 3 mmol/L — ABNORMAL LOW (ref 3.5–5.1)
Sodium: 129 mmol/L — ABNORMAL LOW (ref 135–145)
Total Bilirubin: 0.4 mg/dL (ref 0.3–1.2)
Total Protein: 5.6 g/dL — ABNORMAL LOW (ref 6.5–8.1)

## 2018-09-02 LAB — URINALYSIS, ROUTINE W REFLEX MICROSCOPIC
Bilirubin Urine: NEGATIVE
Glucose, UA: NEGATIVE mg/dL
Hgb urine dipstick: NEGATIVE
Ketones, ur: NEGATIVE mg/dL
Nitrite: NEGATIVE
Protein, ur: 100 mg/dL — AB
Specific Gravity, Urine: 1.009 (ref 1.005–1.030)
WBC, UA: >50 WBC/hpf — ABNORMAL HIGH (ref 0–5)
pH: 6 (ref 5.0–8.0)

## 2018-09-02 LAB — LIPASE, BLOOD: Lipase: 36 U/L (ref 11–51)

## 2018-09-02 LAB — SARS CORONAVIRUS 2 BY RT PCR (HOSPITAL ORDER, PERFORMED IN ~~LOC~~ HOSPITAL LAB): SARS Coronavirus 2: NEGATIVE

## 2018-09-02 MED ORDER — SODIUM CHLORIDE 0.9 % IV BOLUS
1000.0000 mL | Freq: Once | INTRAVENOUS | Status: AC
  Administered 2018-09-02: 1000 mL via INTRAVENOUS

## 2018-09-02 MED ORDER — POTASSIUM CHLORIDE CRYS ER 20 MEQ PO TBCR
10.0000 meq | EXTENDED_RELEASE_TABLET | Freq: Every day | ORAL | Status: DC
  Administered 2018-09-03 – 2018-09-04 (×2): 10 meq via ORAL
  Filled 2018-09-02 (×2): qty 1

## 2018-09-02 MED ORDER — DIPHENOXYLATE-ATROPINE 2.5-0.025 MG PO TABS
1.0000 | ORAL_TABLET | Freq: Four times a day (QID) | ORAL | Status: DC | PRN
  Administered 2018-09-03: 1 via ORAL
  Filled 2018-09-02: qty 1

## 2018-09-02 MED ORDER — SODIUM CHLORIDE 0.9 % IV SOLN
1.0000 g | Freq: Once | INTRAVENOUS | Status: AC
  Administered 2018-09-02: 1 g via INTRAVENOUS
  Filled 2018-09-02: qty 10

## 2018-09-02 MED ORDER — DILTIAZEM HCL ER COATED BEADS 240 MG PO CP24
240.0000 mg | ORAL_CAPSULE | Freq: Every day | ORAL | Status: DC
  Administered 2018-09-03 – 2018-09-04 (×2): 240 mg via ORAL
  Filled 2018-09-02 (×4): qty 1

## 2018-09-02 MED ORDER — CALCIUM CARBONATE-VITAMIN D 500-200 MG-UNIT PO TABS
1.0000 | ORAL_TABLET | Freq: Once | ORAL | Status: DC
  Filled 2018-09-02: qty 1

## 2018-09-02 MED ORDER — ADULT MULTIVITAMIN W/MINERALS CH
1.0000 | ORAL_TABLET | Freq: Every day | ORAL | Status: DC
  Administered 2018-09-02 – 2018-09-04 (×3): 1 via ORAL
  Filled 2018-09-02 (×3): qty 1

## 2018-09-02 MED ORDER — PANTOPRAZOLE SODIUM 40 MG PO TBEC
40.0000 mg | DELAYED_RELEASE_TABLET | Freq: Every day | ORAL | Status: DC
  Administered 2018-09-02 – 2018-09-04 (×3): 40 mg via ORAL
  Filled 2018-09-02 (×3): qty 1

## 2018-09-02 MED ORDER — VITAMIN C 500 MG PO TABS
1000.0000 mg | ORAL_TABLET | Freq: Two times a day (BID) | ORAL | Status: DC
  Administered 2018-09-03 – 2018-09-04 (×3): 1000 mg via ORAL
  Filled 2018-09-02 (×8): qty 2

## 2018-09-02 MED ORDER — CEPHALEXIN 500 MG PO CAPS
500.0000 mg | ORAL_CAPSULE | Freq: Two times a day (BID) | ORAL | Status: DC
  Administered 2018-09-03 (×2): 500 mg via ORAL
  Filled 2018-09-02 (×4): qty 1

## 2018-09-02 MED ORDER — METRONIDAZOLE 500 MG PO TABS
250.0000 mg | ORAL_TABLET | Freq: Three times a day (TID) | ORAL | Status: DC
  Administered 2018-09-03 – 2018-09-04 (×5): 250 mg via ORAL
  Filled 2018-09-02 (×5): qty 1

## 2018-09-02 MED ORDER — ALBUTEROL SULFATE (2.5 MG/3ML) 0.083% IN NEBU
2.5000 mg | INHALATION_SOLUTION | Freq: Once | RESPIRATORY_TRACT | Status: AC
  Administered 2018-09-03: 2.5 mg via RESPIRATORY_TRACT
  Filled 2018-09-02: qty 3

## 2018-09-02 MED ORDER — NIACIN ER 250 MG PO CPCR
500.0000 mg | ORAL_CAPSULE | Freq: Every day | ORAL | Status: DC
  Administered 2018-09-03 (×2): 500 mg via ORAL
  Filled 2018-09-02 (×3): qty 2
  Filled 2018-09-02 (×2): qty 1

## 2018-09-02 MED ORDER — FLAXSEED OIL 1000 MG PO CAPS: 1.0000 | ORAL_CAPSULE | Freq: Two times a day (BID) | ORAL | Status: DC

## 2018-09-02 MED ORDER — TRAMADOL HCL 50 MG PO TABS: 50.0000 mg | ORAL_TABLET | Freq: Four times a day (QID) | ORAL | Status: DC | PRN

## 2018-09-02 MED ORDER — OMEGA-3-ACID ETHYL ESTERS 1 G PO CAPS
1.0000 | ORAL_CAPSULE | Freq: Every day | ORAL | Status: DC
  Administered 2018-09-03 – 2018-09-04 (×2): 1 g via ORAL
  Filled 2018-09-02 (×4): qty 1

## 2018-09-02 MED ORDER — POTASSIUM CHLORIDE CRYS ER 20 MEQ PO TBCR
40.0000 meq | EXTENDED_RELEASE_TABLET | Freq: Once | ORAL | Status: AC
  Administered 2018-09-02: 40 meq via ORAL
  Filled 2018-09-02: qty 2

## 2018-09-02 MED ORDER — DULOXETINE HCL 30 MG PO CPEP
30.0000 mg | ORAL_CAPSULE | Freq: Every day | ORAL | Status: DC
  Administered 2018-09-02 – 2018-09-04 (×3): 30 mg via ORAL
  Filled 2018-09-02 (×3): qty 1

## 2018-09-02 MED ORDER — ASPIRIN EC 81 MG PO TBEC
81.0000 mg | DELAYED_RELEASE_TABLET | Freq: Every day | ORAL | Status: DC
  Administered 2018-09-03 – 2018-09-04 (×3): 81 mg via ORAL
  Filled 2018-09-02 (×3): qty 1

## 2018-09-02 NOTE — [facility] Counselor (Signed)
Per Mordecai Maes, NP patient does not meet criteria for in patient psychiatric treatment. Patient to follow up with social work for discharge planning.

## 2018-09-02 NOTE — Progress Notes (Signed)
CSW in contact with Pt's son Ilee Randleman, [place], who is agreeable to pt discharging SNF per PT recommendation. Pt is also agreeable to discharge to SNF.  CSW will work pt up for SNF placement.   EDP aware.   [facility]  Clinical Social Worker  Ph: [phone]

## 2018-09-02 NOTE — ED Provider Notes (Signed)
[facility] Provider Note   CSN: [id number] Arrival date & time: 09/02/18  1123     History   Chief Complaint Chief Complaint  Patient presents with  . Failure To Thrive  . Suicidal    HPI [name] is a 83 y.o. female.     HPI   Pt is an 83 y/o female with a h/o CAD, COPD, diverticulitis, GERD, CVA, pretension, PVD, CVA, chronic mesenteric ischemia, who presents to the ED for chronic abd pain and suicidal thoughts.  Pt states, "I don't want to be here". States that her family made her come to the ED because she told them she didn't want to live any more and that she has been in pain for so long that she just wants to die. States she has felt depressed and has not had a will to live for 2 years. She has never attempted to harm herself, and has no plans to do so.  States that her son was nervous she would hurt herself "under his watch".  States she had abd pain, vomiting,  and diarrhea. States that these symptoms are chronic for the last 2 years and have not changed since she was discharged from the hospital several days ago. She denies bloody stools.   States she currently lives alone and is able to care for herself. She does not want to go to assisted living, but that she would be open to home health. States that her son frequently visits her during the day.   Past Medical History:  Diagnosis Date  . Anxiety   . Arthritis   . Carotid artery disease ([place]) 2007   R CEA; dopplers 10/2016:  R ICA moderate (non-obstructive plaque) ; L ICA ~50-69%, but may underestimate. (reassess next with either  CTA versus formal cerebral angiogram)   . Constipation due to pain medication   . COPD (chronic obstructive pulmonary disease) ([place])   . Depression   . Diverticulosis   . Dyspnea   . Fibromyalgia   . Gastritis   . GERD (gastroesophageal reflux disease)   . Head injury   . History of pneumonia   . History of stroke 58/8502   complication of R SubClav A  PTA -- R hemispheric CVA  . Hypertension   . Hypothyroidism   . Osteoarthritis   . Peripheral vascular disease ([place])    Bilateral subclavian artery disease (status post R SubClav A PTA).  R side CEA (with US findings of Mod-Severe L CIA stenosis).  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   . Pneumonia   . Stroke ([place])   . Urgency incontinence     Patient Active Problem List   Diagnosis Date Noted  . Malnutrition of moderate degree 08/28/2018  . Hypoxia 08/26/2018  . Hypokalemia 08/26/2018  . Sepsis due to undetermined organism ([place]) 06/26/2018  . Acute respiratory failure with hypoxia ([place]) 06/26/2018  . Lobar pneumonia ([place]) 06/26/2018  . Closed fracture of multiple pubic rami, right, with delayed healing, subsequent encounter 06/26/2018  . Closed fracture of ramus of right pubis ([place])   . Acute colitis 12/02/2016  . Uncontrolled hypertension 12/02/2016  . Enteritis 12/02/2016  . Hyponatremia 12/02/2016  . Status post total shoulder arthroplasty, left 11/21/2016  . COPD (chronic obstructive pulmonary disease) ([place]) 11/21/2016  . Weakness 11/19/2016  . DJD of left shoulder 11/15/2016  . DOE (dyspnea on exertion) 11/11/2016  . Rotator cuff arthropathy of left shoulder 10/30/2016  . Intertrochanteric  fracture of right hip ([place]) 08/01/2015    Class: Acute  . SMA stenosis ([place]) 09/15/2014  . Abdominal pain, right upper quadrant 09/15/2014  . Essential hypertension 09/15/2014  . Depression 09/15/2014  . Hypothyroid 09/15/2014  . GERD without esophagitis 09/15/2014  . Lumbago 11/12/2013  . Stiffness of joint, not elsewhere classified, pelvic region and thigh 11/12/2013  . Muscle weakness (generalized) 11/12/2013  . Lack of coordination 03/21/2013  . Fine motor impairment 03/21/2013  . Left leg weakness 03/17/2013  . Risk for falls 03/17/2013  . Carotid artery disease ([place]) 03/11/2013  . Subclavian artery stenosis ([place]) 03/11/2013  . TIA (transient ischemic  attack) 03/11/2013  . Tobacco use disorder 03/11/2013  . CVA (cerebral infarction) 03/05/2013  . Pain in joint, shoulder region 11/01/2010    Past Surgical History:  Procedure Laterality Date  . ANTERIOR AND POSTERIOR REPAIR  12/2000   Archie Endo 07/04/2010  . ANTERIOR CERVICAL DECOMP/DISCECTOMY FUSION N/A 04/03/2012   Procedure: ANTERIOR CERVICAL DECOMPRESSION/DISCECTOMY FUSION 2 LEVELS;  Surgeon: Hosie Spangle, MD;  Location: [facility];  Service: Neurosurgery;  Laterality: N/A;  Cervical four-five,Cervical five-six anterior cervical decompression with fusion plating and bonegraft  . BACK SURGERY    . BREAST SURGERY Right 02/2007   Archie Endo 06/23/2010  . BUNIONECTOMY WITH HAMMERTOE RECONSTRUCTION Left 09/10/2007   Archie Endo 06/23/2010  . CAROTID ENDARTERECTOMY Right 2007   [facility], Dr. Lucky Cowboy  . CATARACT EXTRACTION W/ INTRAOCULAR LENS IMPLANT Left    Archie Endo 06/23/2010  . CHOLECYSTECTOMY    . COLONOSCOPY N/A 07/15/2014   Procedure: COLONOSCOPY;  Surgeon: Rogene Houston, MD;  Location: [facility];  Service: Endoscopy;  Laterality: N/A;  200 - moved to 2:10 - Ann to notify pt  . COMPRESSION HIP SCREW Right 08/01/2015   Procedure: COMPRESSION HIP;  Surgeon: Carole Civil, MD;  Location: [facility];  Service: Orthopedics;  Laterality: Right;  . ESOPHAGOGASTRODUODENOSCOPY N/A 07/15/2014   Procedure: ESOPHAGOGASTRODUODENOSCOPY (EGD);  Surgeon: Rogene Houston, MD;  Location: [facility];  Service: Endoscopy;  Laterality: N/A;  . EYE SURGERY     cataract /lens both eyes  . FRACTURE SURGERY    . INCONTINENCE SURGERY  12/2000   Tension-free transvaginal tape procedure/notes 07/04/2010  . IR ANGIOGRAM SELECTIVE EACH ADDITIONAL VESSEL  01/21/2018  . IR ANGIOGRAM VISCERAL SELECTIVE  01/21/2018  . IR PTA NON CORO-LOWER EXTREM  01/21/2018  . IR RADIOLOGIST EVAL & MGMT  12/11/2017  . IR TRANSCATH PLC STENT 1ST ART NOT LE CV CAR VERT CAR  01/21/2018  . IR US GUIDE VASC ACCESS RIGHT  01/21/2018  . JOINT  REPLACEMENT    . LUMBAR FUSION  08/2006   Archie Endo 06/23/2010  . PERIPHERAL VASCULAR BALLOON ANGIOPLASTY  02/2013   Aortic arch angiography revealed 70% innominate/R subclavian stenosis along with 60% left common carotid ostial stenosis and moderate left subclavian artery stenosis. ->  Innominate artery PTA with 7 mm balloon reducing stenosis to roughly 30%.  Marland Kitchen PERIPHERAL VASCULAR CATHETERIZATION N/A 10/08/2014   Procedure: Visceral Angiography;  Surgeon: Algernon Huxley, MD;  Location: [facility] LAB;  Service: Cardiovascular;  - long stenosis/occlusion of SMA. 75% celiac artery (PTA with 6 mm balloon)  . PERIPHERAL VASCULAR CATHETERIZATION N/A 10/08/2014   Procedure: Visceral Artery Intervention;  Surgeon: Algernon Huxley, MD;  Location: [facility] LAB;  Service: Cardiovascular;: 6 mm PTA balloon to stenosed CELIAC ARTERY  . ROTATOR CUFF REPAIR Left X 3  . TOTAL KNEE ARTHROPLASTY Bilateral 2005; 2011  right; left  . TOTAL SHOULDER ARTHROPLASTY Left 11/15/2016  . TOTAL SHOULDER ARTHROPLASTY Left 11/15/2016   Procedure: LEFT TOTAL SHOULDER ARTHROPLASTY;  Surgeon: [name], MD;  Location: [facility];  Service: Orthopedics;  Laterality: Left;  . TRANSTHORACIC ECHOCARDIOGRAM  10/2016   Normal LV wall thickness with LVEF 60-65% and grade 1 diastolic dysfunction.  Aortic valve calcific sclerosis with no stenosis.  Mildly calcified mitral annulus and leaflets.   Marland Kitchen VAGINAL HYSTERECTOMY  12/2000   Archie Endo 07/04/2010     OB History   No obstetric history on file.      Home Medications    Prior to Admission medications   Medication Sig Start Date End Date Taking? Authorizing Provider  Ascorbic Acid (VITAMIN C) 1000 MG tablet Take 1,000 mg by mouth 2 (two) times daily.    Yes [provider]  aspirin EC 81 MG tablet Take 81 mg by mouth daily.   Yes [provider]  Calcium Carb-Cholecalciferol (CALCIUM 600 + D PO) Take 1-2 tablets by mouth See admin instructions. 2 tabs in the  morning, and 1 tab at [facility]   Yes [provider]  diltiazem (CARDIZEM CD) 240 MG 24 hr capsule TAKE 1 CAPSULE(240 MG) BY MOUTH DAILY 06/21/18  Yes Leonie Man, MD  diphenhydrAMINE (BENADRYL) 25 mg capsule Take 25 mg by mouth at bedtime.   Yes [provider]  diphenoxylate-atropine (LOMOTIL) 2.5-0.025 MG tablet Take 1 tablet by mouth 4 (four) times daily as needed for diarrhea or loose stools. 08/29/18 08/29/19 Yes Sinda Du, MD  DULoxetine (CYMBALTA) 30 MG capsule Take 30 mg by mouth daily.    Yes [provider]  Flaxseed, Linseed, (FLAXSEED OIL) 1000 MG CAPS Take 1 capsule by mouth 2 (two) times daily.   Yes [provider]  metroNIDAZOLE (FLAGYL) 250 MG tablet Take 1 tablet (250 mg total) by mouth 3 (three) times daily after meals for 5 days. 08/29/18 09/03/18 Yes Sinda Du, MD  Multiple Vitamin (MULTIVITAMIN WITH MINERALS) TABS Take 1 tablet by mouth daily.   Yes [provider]  niacin 500 MG CR capsule Take 500 mg by mouth at bedtime.   Yes [provider]  Omega-3 1000 MG CAPS Take 1 capsule by mouth daily.   Yes [provider]  pantoprazole (PROTONIX) 40 MG tablet Take 1 tablet (40 mg total) by mouth daily. 05/27/18  Yes Setzer, Rona Ravens, NP  traMADol (ULTRAM) 50 MG tablet Take 1 tablet (50 mg total) by mouth every 6 (six) hours as needed for moderate pain. 07/02/18  Yes Sinda Du, MD    Family History Family History  Problem Relation Age of Onset  . Pancreatic cancer Mother   . Prostate cancer Father     Social History Social History   Tobacco Use  . Smoking status: Former Smoker    Packs/day: 0.12    Years: 63.00    Pack years: 7.56    Types: Cigarettes  . Smokeless tobacco: Never Used  Substance Use Topics  . Alcohol use: No    Alcohol/week: 0.0 standard drinks  . Drug use: No     Allergies   Aleve [naproxen sodium], Hydrocodone, Statins, Celebrex [celecoxib], Codeine, and Morphine and related    Review of Systems Review of Systems  Constitutional: Negative for fever.  HENT: Negative for ear pain and sore throat.   Eyes: Negative for visual disturbance.  Respiratory: Negative for cough and shortness of breath.   Cardiovascular: Negative for chest pain.  Gastrointestinal: Positive for abdominal pain, diarrhea, nausea and vomiting.  Genitourinary: Negative for dysuria and hematuria.  Musculoskeletal: Negative for back pain.  Skin: Negative for rash.  Neurological: Negative for headaches.  Psychiatric/Behavioral: Positive for suicidal ideas.  All other systems reviewed and are negative.    Physical Exam Updated Vital Signs BP (!) 143/60 (BP Location: Left Arm)   Pulse 73   Temp 98 F (36.7 C) (Oral)   Resp 12   Ht 5\' 1"  (1.549 m)   Wt 47.6 kg   SpO2 93%   BMI 19.84 kg/m   Physical Exam Vitals signs and nursing note reviewed.  Constitutional:      General: She is not in acute distress.    Appearance: She is well-developed.  HENT:     Head: Normocephalic and atraumatic.  Eyes:     Conjunctiva/sclera: Conjunctivae normal.  Neck:     Musculoskeletal: Neck supple.  Cardiovascular:     Rate and Rhythm: Normal rate and regular rhythm.     Pulses: Normal pulses.     Heart sounds: Normal heart sounds. No murmur.  Pulmonary:     Effort: Pulmonary effort is normal. No respiratory distress.     Breath sounds: Normal breath sounds. No wheezing, rhonchi or rales.  Abdominal:     General: Bowel sounds are normal.     Palpations: Abdomen is soft.     Comments: Diffuse tenderness (she states this is chronic). Normo active BS.   Skin:    General: Skin is warm and dry.  Neurological:     Mental Status: She is alert.      ED Treatments / Results  Labs (all labs ordered are listed, but only abnormal results are displayed) Labs Reviewed  CBC WITH DIFFERENTIAL/PLATELET - Abnormal; Notable for the following components:      Result Value   HCT 35.7 (*)    Platelets  411 (*)    All other components within normal limits  COMPREHENSIVE METABOLIC PANEL - Abnormal; Notable for the following components:   Sodium 129 (*)    Potassium 3.0 (*)    Chloride 93 (*)    Calcium 6.4 (*)    Total Protein 5.6 (*)    Albumin 3.2 (*)    All other components within normal limits  URINALYSIS, ROUTINE W REFLEX MICROSCOPIC - Abnormal; Notable for the following components:   APPearance CLOUDY (*)    Protein, ur 100 (*)    Leukocytes,Ua LARGE (*)    WBC, UA >50 (*)    Bacteria, UA MANY (*)    All other components within normal limits  SARS CORONAVIRUS 2 (HOSPITAL ORDER, [facility])  URINE CULTURE  LIPASE, BLOOD    EKG None  Radiology Dg Abd Acute W/chest  Result Date: 09/02/2018 CLINICAL DATA:  Diarrhea. EXAM: DG ABDOMEN ACUTE W/ 1V CHEST COMPARISON:  CT abdomen pelvis dated August 26, 2018. Chest x-ray dated August 26, 2018. FINDINGS: The cardiomediastinal silhouette is normal in size. Normal pulmonary vascularity. No focal consolidation, pleural effusion, or pneumothorax. There is no evidence of dilated bowel loops or free intraperitoneal air. No radiopaque calculi or other significant radiographic abnormality is seen. Prior cholecystectomy. No acute osseous abnormality. IMPRESSION: Negative abdominal radiographs.  No acute cardiopulmonary disease. Electronically Signed   By: Titus Dubin M.D.   On: 09/02/2018 13:56    Procedures Procedures (including critical care time)  Medications Ordered in ED Medications  calcium-vitamin D (OSCAL WITH D) 500-200 MG-UNIT per tablet  1 tablet (1 tablet Oral Not Given 09/02/18 1701)  albuterol (PROVENTIL) (2.5 MG/3ML) 0.083% nebulizer solution 2.5 mg (2.5 mg Nebulization Not Given 09/02/18 1934)  potassium chloride SA (K-DUR) CR tablet 40 mEq (has no administration in time range)  potassium chloride SA (K-DUR) CR tablet 10 mEq (has no administration in time range)  cephALEXin (KEFLEX) capsule 500 mg (has  no administration in time range)  sodium chloride 0.9 % bolus 1,000 mL (0 mLs Intravenous Stopped 09/02/18 1701)  cefTRIAXone (ROCEPHIN) 1 g in sodium chloride 0.9 % 100 mL IVPB (0 g Intravenous Stopped 09/02/18 1701)     Initial Impression / Assessment and Plan / ED Course  I have reviewed the triage vital signs and the nursing notes.  Pertinent labs & imaging results that were available during my care of the patient were reviewed by me and considered in my medical decision making (see chart for details).  Final Clinical Impressions(s) / ED Diagnoses   Final diagnoses:  Chronic abdominal pain   83 year old female presenting for evaluation for multiple complaints.  She states she was asked to come here by her son as he felt she would be safer living in an assisted living facility.  She voices passive suicidal ideations during eval. she also complains of abdominal pain, nausea vomiting and diarrhea which she states are chronic problems and unchanged from prior.  She is nontoxic, nonseptic appearing.  Her abdomen is soft.  She does have some mild tenderness but she states that this is chronic.  The remainder of exam is benign.  CBC shows no leukocytosis.  No anemia. CMP shows hyponatremia and hypokalemia.  Also w/ hypocalcemia. No renal and hepatic fxn.  - K was supplemented in the ED, have initiated a course of K supplementation.   - Ca supplemented. Low albumin, corrected calcium is at least 6.6. On chronic supplementation, asymptomatic.  Lipase negative UA with large leukocytes, 0-5 RBCs, greater than 50 RBCs, many bacteria, WBC clumps.  Suggestive of UTI.  Urine culture sent.    - Ceftriaxone given. Course of keflex initiated.  COVID testing negative  Xray abd/chest negative for acute findings.   TTS consult was placed given patient's reports of suicidality.  Behavioral health evaluated the patient and she is psychiatrically cleared.  Case management/social work was consulted with  regards to SNF placement versus home health.  Physical therapy evaluated the patient and recommended SNF.  Patient initially hesitant however after long discussion, she is in agreement to be discharged to SNF.  Case management is attempting to find placement.  Pt care transitioned to default provider at shift change pending placement. Home meds have been ordered.   ED Discharge Orders    None       Bishop Dublin 09/02/18 2150    Milton Ferguson, MD 09/04/18 [phone]

## 2018-09-02 NOTE — Progress Notes (Signed)
2nd shift ED CSW received consult and am currently in contact with [name], Pts son to discuss Pt evaluation recommendations for SNF. [name] is receptive to PT recommendation and suggests that referrals are sent out to surrounding area facilities other than [facility].   CSW will update pt on this conversation and discuss her decision regarding discharge needs. CSW will continue to follow pt for discharge needs.

## 2018-09-02 NOTE — Discharge Instructions (Addendum)
You were given a prescription for antibiotics. Please take the antibiotic prescription fully.   A culture was sent of your urine today to determine if there is any bacterial growth. If the results of the culture are positive and you require an antibiotic or a change of your prescribed antibiotic you will be contacted by the hospital. If the results are negative you will not be contacted.  Please follow up with your primary care provider within 5-7 days for re-evaluation of your symptoms. If you do not have a primary care provider, information for a healthcare clinic has been provided for you to make arrangements for follow up care. Please return to the emergency department for any new or worsening symptoms.  

## 2018-09-02 NOTE — NC FL2 (Signed)
[place] LEVEL OF CARE SCREENING TOOL     IDENTIFICATION  Patient Name: [name] Birthdate: 05-Mar-1935 Sex: female Admission Date (Current Location): 09/02/2018  [facility] and [place] Number:  [employer] and Address:  [facility] [address], [place]      Provider Number: [id number]  Attending Physician Name and Address:  Default, Provider, MD  Relative Name and Phone Number:  Orma Cheetham Ph:[phone]    Current Level of Care: Hospital Recommended Level of Care: [place] Prior Approval Number: [id number]  Date Approved/Denied: 08/03/15 PASRR Number: [id number]  Discharge Plan: SNF    Current Diagnoses: Patient Active Problem List   Diagnosis Date Noted  . Malnutrition of moderate degree 08/28/2018  . Hypoxia 08/26/2018  . Hypokalemia 08/26/2018  . Sepsis due to undetermined organism ([place]) 06/26/2018  . Acute respiratory failure with hypoxia ([place]) 06/26/2018  . Lobar pneumonia ([place]) 06/26/2018  . Closed fracture of multiple pubic rami, right, with delayed healing, subsequent encounter 06/26/2018  . Closed fracture of ramus of right pubis ([place])   . Acute colitis 12/02/2016  . Uncontrolled hypertension 12/02/2016  . Enteritis 12/02/2016  . Hyponatremia 12/02/2016  . Status post total shoulder arthroplasty, left 11/21/2016  . COPD (chronic obstructive pulmonary disease) ([place]) 11/21/2016  . Weakness 11/19/2016  . DJD of left shoulder 11/15/2016  . DOE (dyspnea on exertion) 11/11/2016  . Rotator cuff arthropathy of left shoulder 10/30/2016  . Intertrochanteric fracture of right hip ([place]) 08/01/2015    Class: Acute  . SMA stenosis ([place]) 09/15/2014  . Abdominal pain, right upper quadrant 09/15/2014  . Essential hypertension 09/15/2014  . Depression 09/15/2014  . Hypothyroid 09/15/2014  . GERD without esophagitis 09/15/2014  . Lumbago 11/12/2013  . Stiffness of joint, not elsewhere  classified, pelvic region and thigh 11/12/2013  . Muscle weakness (generalized) 11/12/2013  . Lack of coordination 03/21/2013  . Fine motor impairment 03/21/2013  . Left leg weakness 03/17/2013  . Risk for falls 03/17/2013  . Carotid artery disease ([place]) 03/11/2013  . Subclavian artery stenosis ([place]) 03/11/2013  . TIA (transient ischemic attack) 03/11/2013  . Tobacco use disorder 03/11/2013  . CVA (cerebral infarction) 03/05/2013  . Pain in joint, shoulder region 11/01/2010    Orientation RESPIRATION BLADDER Height & Weight     Self, Time, Situation, Place  Normal Continent(wears adult inserts to prevent accidents) Weight: 105 lb (47.6 kg) Height:  5\' 1"  (154.9 cm)  BEHAVIORAL SYMPTOMS/MOOD NEUROLOGICAL BOWEL NUTRITION STATUS      Continent    AMBULATORY STATUS COMMUNICATION OF NEEDS Skin   Limited Assist Verbally Normal                       Personal Care Assistance Level of Assistance  Bathing, Feeding, Dressing Bathing Assistance: Limited assistance Feeding assistance: Independent Dressing Assistance: Limited assistance     Functional Limitations Info  Sight Sight Info: Impaired        SPECIAL CARE FACTORS FREQUENCY  PT (By licensed PT), OT (By licensed OT)     PT Frequency: 5x weekly OT Frequency: 5x weekly            Contractures Contractures Info: Not present    Additional Factors Info  Code Status, Allergies Code Status Info: DNR Allergies Info: Aleve (Naproxen Sodium) Hydrocodone Statins Celebrex (Celecoxib) Codeine Morphine And Related           Current Medications (09/02/2018):  This is the current  hospital active medication list Current Facility-Administered Medications  Medication Dose Route Frequency Provider Last Rate Last Dose  . albuterol (PROVENTIL) (2.5 MG/3ML) 0.083% nebulizer solution 2.5 mg  2.5 mg Nebulization Once Milton Ferguson, MD      . aspirin EC tablet 81 mg  81 mg Oral Daily Couture, Cortni S, PA-C      .  calcium-vitamin D (OSCAL WITH D) 500-200 MG-UNIT per tablet 1 tablet  1 tablet Oral Once Couture, Cortni S, PA-C      . [START ON 09/03/2018] cephALEXin (KEFLEX) capsule 500 mg  500 mg Oral BID Couture, Cortni S, PA-C      . diltiazem (CARDIZEM CD) 24 hr capsule 240 mg  240 mg Oral Daily Couture, Cortni S, PA-C      . diphenoxylate-atropine (LOMOTIL) 2.5-0.025 MG per tablet 1 tablet  1 tablet Oral QID PRN Couture, Cortni S, PA-C      . DULoxetine (CYMBALTA) DR capsule 30 mg  30 mg Oral Daily Couture, Cortni S, PA-C      . [START ON 09/03/2018] metroNIDAZOLE (FLAGYL) tablet 250 mg  250 mg Oral TID PC Couture, Cortni S, PA-C      . multivitamin with minerals tablet 1 tablet  1 tablet Oral Daily Couture, Cortni S, PA-C      . niacin CR capsule 500 mg  500 mg Oral QHS Couture, Cortni S, PA-C      . Omega-3 CAPS 1,000 mg  1 capsule Oral Daily Couture, Cortni S, PA-C      . pantoprazole (PROTONIX) EC tablet 40 mg  40 mg Oral Daily Couture, Cortni S, PA-C      . [START ON 09/03/2018] potassium chloride SA (K-DUR) CR tablet 10 mEq  10 mEq Oral Daily Couture, Cortni S, PA-C      . potassium chloride SA (K-DUR) CR tablet 40 mEq  40 mEq Oral Once Couture, Cortni S, PA-C      . traMADol (ULTRAM) tablet 50 mg  50 mg Oral Q6H PRN Couture, Cortni S, PA-C      . vitamin C (ASCORBIC ACID) tablet 1,000 mg  1,000 mg Oral BID Couture, Cortni S, PA-C       Current Outpatient Medications  Medication Sig Dispense Refill  . Ascorbic Acid (VITAMIN C) 1000 MG tablet Take 1,000 mg by mouth 2 (two) times daily.     Marland Kitchen aspirin EC 81 MG tablet Take 81 mg by mouth daily.    . Calcium Carb-Cholecalciferol (CALCIUM 600 + D PO) Take 1-2 tablets by mouth See admin instructions. 2 tabs in the morning, and 1 tab at [facility]    . diltiazem (CARDIZEM CD) 240 MG 24 hr capsule TAKE 1 CAPSULE(240 MG) BY MOUTH DAILY (Patient taking differently: Take 240 mg by mouth daily. ) 60 capsule 0  . diphenhydrAMINE (BENADRYL) 25 mg capsule Take 25 mg by  mouth at bedtime.    . diphenoxylate-atropine (LOMOTIL) 2.5-0.025 MG tablet Take 1 tablet by mouth 4 (four) times daily as needed for diarrhea or loose stools. 30 tablet 1  . DULoxetine (CYMBALTA) 30 MG capsule Take 30 mg by mouth daily.     . Flaxseed, Linseed, (FLAXSEED OIL) 1000 MG CAPS Take 1 capsule by mouth 2 (two) times daily.    . metroNIDAZOLE (FLAGYL) 250 MG tablet Take 1 tablet (250 mg total) by mouth 3 (three) times daily after meals for 5 days. 15 tablet 0  . Multiple Vitamin (MULTIVITAMIN WITH MINERALS) TABS Take 1 tablet by mouth daily.    Marland Kitchen  niacin 500 MG CR capsule Take 500 mg by mouth at bedtime.    . Omega-3 1000 MG CAPS Take 1 capsule by mouth daily.    . pantoprazole (PROTONIX) 40 MG tablet Take 1 tablet (40 mg total) by mouth daily. 30 tablet 11  . traMADol (ULTRAM) 50 MG tablet Take 1 tablet (50 mg total) by mouth every 6 (six) hours as needed for moderate pain. 20 tablet 1     Discharge Medications: Please see discharge summary for a list of discharge medications.  Relevant Imaging Results:  Relevant Lab Results:   Additional Information SSN [place], [place]

## 2018-09-02 NOTE — Progress Notes (Signed)
FL2 needing EDP co-signature

## 2018-09-02 NOTE — BH Assessment (Signed)
Tele Assessment Note   Patient Name: [name] MRN: [id number] Referring Physician: Roderic Palau Location of Patient: [facility] ED Location of Provider: [place] is an 83 y.o. female presenting voluntarily to [facility] ED via EMS due to [facility] without specific plan. Per EDP: "Pt states, 'I don't want to be here'. States that her family made her come to the ED because she told them she didn't want to live any more and that she has been in pain for so long that she just wants to die. States she has felt depressed and has not had a will to live for 2 years. She has never attempted to harm herself, and has no plan to do so.  States that her son was nervous she would hurt herself 'under his watch.'"  Upon this clinician's exam patient is calm and cooperative. She states her son convinced her to come to the ED after stating she does not want to be alive anymore. Patient reports she has felt depressed for 2 years due to chronic pain and not being able to maintain her home without assistance. She states that she has a home health agency that comes for her physical needs but does not have assistance with chores. She endorses passive SI without plan or intent. Patient denies HI/AVH. She denies any prior psychiatric treatment. She states she has 3 sons that are supportive. Patient reports if she had more assistance she would not want to die. Patient denies any substance use, trauma history, or criminal charges.   Patient is alert and oriented. She is dressed in scrubs, sitting up right in bed. Her speech is logical, eye contact is good, and thoughts are organized. Patient's mood is depressed and affect is congruent. Patient's insight, judgement, and impulse control are partially impaired. Patient does not appear to be responding to internal stimuli or experiencing delusional thought content.   Disposition: Per Mordecai Maes, NP patient is psych cleared. She recommends patient consult  with SW for placement in a SNF or additional supports after discharge.  Diagnosis: F32.2 MDD, single episode, severe  Past Medical History:  Past Medical History:  Diagnosis Date  . Anxiety   . Arthritis   . Carotid artery disease ([place]) 2007   R CEA; dopplers 10/2016:  R ICA moderate (non-obstructive plaque) ; L ICA ~50-69%, but may underestimate. (reassess next with either  CTA versus formal cerebral angiogram)   . Constipation due to pain medication   . COPD (chronic obstructive pulmonary disease) ([place])   . Depression   . Diverticulosis   . Dyspnea   . Fibromyalgia   . Gastritis   . GERD (gastroesophageal reflux disease)   . Head injury   . History of pneumonia   . History of stroke [id number]/9381   complication of R SubClav A PTA -- R hemispheric CVA  . Hypertension   . Hypothyroidism   . Osteoarthritis   . Peripheral vascular disease ([place])    Bilateral subclavian artery disease (status post R SubClav A PTA).  R side CEA (with US findings of Mod-Severe L CIA stenosis).  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   . Pneumonia   . Stroke ([place])   . Urgency incontinence     Past Surgical History:  Procedure Laterality Date  . ANTERIOR AND POSTERIOR REPAIR  12/2000   Archie Endo 07/04/2010  . ANTERIOR CERVICAL DECOMP/DISCECTOMY FUSION N/A 04/03/2012   Procedure: ANTERIOR CERVICAL DECOMPRESSION/DISCECTOMY FUSION 2 LEVELS;  Surgeon: Herbie Baltimore  Virgie Dad, MD;  Location: [place] NEURO ORS;  Service: Neurosurgery;  Laterality: N/A;  Cervical four-five,Cervical five-six anterior cervical decompression with fusion plating and bonegraft  . BACK SURGERY    . BREAST SURGERY Right 02/2007   Archie Endo 06/23/2010  . BUNIONECTOMY WITH HAMMERTOE RECONSTRUCTION Left 09/10/2007   Archie Endo 06/23/2010  . CAROTID ENDARTERECTOMY Right 2007   [facility], Dr. Lucky Cowboy  . CATARACT EXTRACTION W/ INTRAOCULAR LENS IMPLANT Left    Archie Endo 06/23/2010  . CHOLECYSTECTOMY    . COLONOSCOPY N/A 07/15/2014   Procedure: COLONOSCOPY;   Surgeon: Rogene Houston, MD;  Location[facility];  Service: Endoscopy;  Laterality: N/A;  200 - moved to 2:10 - Ann to notify pt  . COMPRESSION HIP SCREW Right 08/01/2015   Procedure: COMPRESSION HIP;  Surgeon: Carole Civil, MD;  Location: [facility] ORS;  Service: Orthopedics;  Laterality: Right;  . ESOPHAGOGASTRODUODENOSCOPY N/A 07/15/2014   Procedure: ESOPHAGOGASTRODUODENOSCOPY (EGD);  Surgeon: Rogene Houston, MD;  Location: [facility];  Service: Endoscopy;  Laterality: N/A;  . EYE SURGERY     cataract /lens both eyes  . FRACTURE SURGERY    . INCONTINENCE SURGERY  12/2000   Tension-free transvaginal tape procedure/notes 07/04/2010  . IR ANGIOGRAM SELECTIVE EACH ADDITIONAL VESSEL  01/21/2018  . IR ANGIOGRAM VISCERAL SELECTIVE  01/21/2018  . IR PTA NON CORO-LOWER EXTREM  01/21/2018  . IR RADIOLOGIST EVAL & MGMT  12/11/2017  . IR TRANSCATH PLC STENT 1ST ART NOT LE CV CAR VERT CAR  01/21/2018  . IR US GUIDE VASC ACCESS RIGHT  01/21/2018  . JOINT REPLACEMENT    . LUMBAR FUSION  08/2006   Archie Endo 06/23/2010  . PERIPHERAL VASCULAR BALLOON ANGIOPLASTY  02/2013   Aortic arch angiography revealed 70% innominate/R subclavian stenosis along with 60% left common carotid ostial stenosis and moderate left subclavian artery stenosis. ->  Innominate artery PTA with 7 mm balloon reducing stenosis to roughly 30%.  Marland Kitchen PERIPHERAL VASCULAR CATHETERIZATION N/A 10/08/2014   Procedure: Visceral Angiography;  Surgeon: Algernon Huxley, MD;  Location: [place] CV LAB;  Service: Cardiovascular;  - long stenosis/occlusion of SMA. 75% celiac artery (PTA with 6 mm balloon)  . PERIPHERAL VASCULAR CATHETERIZATION N/A 10/08/2014   Procedure: Visceral Artery Intervention;  Surgeon: Algernon Huxley, MD;  Location: [place] CV LAB;  Service: Cardiovascular;: 6 mm PTA balloon to stenosed CELIAC ARTERY  . ROTATOR CUFF REPAIR Left X 3  . TOTAL KNEE ARTHROPLASTY Bilateral 2005; 2011   right; left  . TOTAL SHOULDER ARTHROPLASTY  Left 11/15/2016  . TOTAL SHOULDER ARTHROPLASTY Left 11/15/2016   Procedure: LEFT TOTAL SHOULDER ARTHROPLASTY;  Surgeon: [name], MD;  Location: [place];  Service: Orthopedics;  Laterality: Left;  . TRANSTHORACIC ECHOCARDIOGRAM  10/2016   Normal LV wall thickness with LVEF 60-65% and grade 1 diastolic dysfunction.  Aortic valve calcific sclerosis with no stenosis.  Mildly calcified mitral annulus and leaflets.   Marland Kitchen VAGINAL HYSTERECTOMY  12/2000   Archie Endo 07/04/2010    Family History:  Family History  Problem Relation Age of Onset  . Pancreatic cancer Mother   . Prostate cancer Father     Social History:  reports that she has quit smoking. Her smoking use included cigarettes. She has a 7.56 pack-year smoking history. She has never used smokeless tobacco. She reports that she does not drink alcohol or use drugs.  Additional Social History:  Alcohol / Drug Use Pain Medications: see MAR Prescriptions: see MAR Over the Counter: see  MAR History of alcohol / drug use?: No history of alcohol / drug abuse  CIWA: CIWA-Ar BP: (!) 146/60 Pulse Rate: 70 COWS:    Allergies:  Allergies  Allergen Reactions  . Aleve [Naproxen Sodium] Hives and Palpitations  . Hydrocodone Nausea Only  . Statins Other (See Comments)    Muscle pain  . Celebrex [Celecoxib] Nausea And Vomiting  . Codeine Nausea And Vomiting  . Morphine And Related Nausea And Vomiting    Home Medications: (Not in a hospital admission)   OB/GYN Status:  No LMP recorded. Patient has had a hysterectomy.  General Assessment Data Assessment unable to be completed: Yes Reason for not completing assessment: multiple walk in assessments Location of Assessment: [facility] ED TTS Assessment: In system Is this a Tele or Face-to-Face Assessment?: Tele Assessment Is this an Initial Assessment or a Re-assessment for this encounter?: Initial Assessment Patient Accompanied by:: N/A Language Other than English: No Living Arrangements: Other  (Comment)(her home) What gender do you identify as?: Female Marital status: Widowed [place] name: [place] Pregnancy Status: No Living Arrangements: Alone Can pt return to current living arrangement?: Yes Admission Status: Voluntary Is patient capable of signing voluntary admission?: Yes Referral Source: Self/Family/Friend Insurance type: [employer]     Crisis Care Plan Living Arrangements: Alone Legal Guardian: (self) Name of Psychiatrist: none Name of Therapist: none  Education Status Is patient currently in school?: No Is the patient employed, unemployed or receiving disability?: Unemployed  Risk to self with the past 6 months Suicidal Ideation: Yes-Currently Present Has patient been a risk to self within the past 6 months prior to admission? : No Suicidal Intent: No Has patient had any suicidal intent within the past 6 months prior to admission? : No Is patient at risk for suicide?: No Suicidal Plan?: No Has patient had any suicidal plan within the past 6 months prior to admission? : No Access to Means: No What has been your use of drugs/alcohol within the last 12 months?: denies Previous Attempts/Gestures: No How many times?: 0 Other Self Harm Risks: none noted Triggers for Past Attempts: None known Intentional Self Injurious Behavior: None Family Suicide History: No Recent stressful life event(s): Recent negative physical changes Persecutory voices/beliefs?: No Depression: Yes Depression Symptoms: Despondent, Loss of interest in usual pleasures, Feeling worthless/self pity, Feeling angry/irritable, Isolating Substance abuse history and/or treatment for substance abuse?: No Suicide prevention information given to non-admitted patients: Not applicable  Risk to Others within the past 6 months Homicidal Ideation: No Does patient have any lifetime risk of violence toward others beyond the six months prior to admission? : No Thoughts of Harm to Others:  No Current Homicidal Intent: No Current Homicidal Plan: No Access to Homicidal Means: No Identified Victim: none History of harm to others?: No Assessment of Violence: None Noted Violent Behavior Description: none noted Does patient have access to weapons?: No Criminal Charges Pending?: No Does patient have a court date: No Is patient on probation?: No  Psychosis Hallucinations: None noted Delusions: None noted  Mental Status Report Appearance/Hygiene: Unremarkable Eye Contact: Good Motor Activity: Freedom of movement Speech: Logical/coherent Level of Consciousness: Alert Mood: Depressed Affect: Depressed Anxiety Level: Minimal Thought Processes: Coherent, Relevant Judgement: Impaired Orientation: Person, Time, Place, Situation Obsessive Compulsive Thoughts/Behaviors: None  Cognitive Functioning Concentration: Normal Memory: Recent Intact, Remote Intact Is patient IDD: No Insight: Fair Impulse Control: Good Appetite: Poor Have you had any weight changes? : Loss Amount of the weight change? (lbs): (UTA) Sleep: No Change Total Hours  of Sleep: 8 Vegetative Symptoms: None  ADLScreening [facility] Assessment Services) Patient's cognitive ability adequate to safely complete daily activities?: Yes Patient able to express need for assistance with ADLs?: Yes Independently performs ADLs?: Yes (appropriate for developmental age)  Prior Inpatient Therapy Prior Inpatient Therapy: No  Prior Outpatient Therapy Prior Outpatient Therapy: No Does patient have an ACCT team?: No Does patient have Intensive In-House Services?  : No Does patient have [facility] services? : No Does patient have P4CC services?: No  ADL Screening (condition at time of admission) Patient's cognitive ability adequate to safely complete daily activities?: Yes Is the patient deaf or have difficulty hearing?: No Does the patient have difficulty seeing, even when wearing glasses/contacts?: No Does the patient  have difficulty concentrating, remembering, or making decisions?: No Patient able to express need for assistance with ADLs?: Yes Does the patient have difficulty dressing or bathing?: No Independently performs ADLs?: Yes (appropriate for developmental age) Does the patient have difficulty walking or climbing stairs?: Yes Weakness of Legs: Both Weakness of Arms/Hands: Both  Home Assistive Devices/Equipment Home Assistive Devices/Equipment: Cane (specify quad or straight)  Therapy Consults (therapy consults require a physician order) PT Evaluation Needed: No OT Evalulation Needed: No SLP Evaluation Needed: No Abuse/Neglect Assessment (Assessment to be complete while patient is alone) Physical Abuse: Denies Verbal Abuse: Denies Sexual Abuse: Denies Exploitation of patient/patient's resources: Denies Self-Neglect: Denies Values / Beliefs Cultural Requests During Hospitalization: None Spiritual Requests During Hospitalization: None Consults Spiritual Care Consult Needed: No Social Work Consult Needed: No Regulatory affairs officer (For Healthcare) Does Patient Have a Medical Advance Directive?: Yes Type of Advance Directive: Healthcare Power of Attorney          Disposition: Per Mordecai Maes, NP patient is psych cleared. She recommends patient consult with SW for placement in a SNF or additional supports after discharge. Disposition Initial Assessment Completed for this Encounter: Yes  This service was provided via telemedicine using a 2-way, interactive audio and video technology.  Names of all persons participating in this telemedicine service and their role in this encounter. Name: Orvis Brill, LCSW Role: TTS  Name: Octavia Heir Role: patient  Name:  Role:   Name:  Role:     Orvis Brill 09/02/2018 5:08 PM

## 2018-09-02 NOTE — ED Triage Notes (Signed)
Pt brought in by ems. Was d/c on Thursday from hospital for diarrhea and returns with same complaint. States she had 2 cups of coffee and applesauce this morning with 2 bowel movements. Pt lives alone and son wanted her to go to assisted living, but she refused. Also did not want to come to ED today but her son made her.

## 2018-09-02 NOTE — Plan of Care (Signed)
  Problem: Acute Rehab PT Goals(only PT should resolve) Goal: Pt Will Go Supine/Side To Sit Outcome: Progressing Flowsheets (Taken 09/02/2018 1530) Pt will go Supine/Side to Sit: with modified independence Goal: Patient Will Transfer Sit To/From Stand Outcome: Progressing Flowsheets (Taken 09/02/2018 1530) Patient will transfer sit to/from stand: with modified independence Goal: Pt Will Transfer Bed To Chair/Chair To Bed Outcome: Progressing Flowsheets (Taken 09/02/2018 1530) Pt will Transfer Bed to Chair/Chair to Bed: with modified independence Goal: Pt Will Ambulate Outcome: Progressing Flowsheets (Taken 09/02/2018 1530) Pt will Ambulate:  > 125 feet  with supervision   3:30 PM, 09/02/18 Lonell Grandchild, MPT Physical Therapist with [facility] [phone] office [phone] mobile phone

## 2018-09-02 NOTE — Clinical Social Work Note (Addendum)
Called by ED, made aware of consult.  Pt who was just discharged is back and son is concerned that she needs higher level of care.  In fact, rehab was recommended last time patient was here, and she declined.  Asked nursing for PT consult order.  Will follow up with patient and son once we have PT recommendation.

## 2018-09-02 NOTE — ED Notes (Signed)
Pt ambulatory to bathroom with assist and walker. Audible wheezing noted when returned. Rt notified of need for tx.

## 2018-09-02 NOTE — Evaluation (Signed)
Physical Therapy Evaluation Patient Details Name: [name] MRN: [id number] DOB: [birth year] Today's Date: 09/02/2018   History of Present Illness  [name] is a 83 y/o female with c/o generalized weakness and diarrhea  Clinical Impression  Patient demonstrates slightly labored movement for sitting up at bedside, has to lean on nearby objects for support due to decreased standing balance and at risk for falls.  Patient required use of RW for safety during ambulation with slow slightly labored cadence, no loss of balance and limited due to fatigue.  Patient put back to bed after therapy.  Patient will benefit from continued physical therapy in hospital and recommended venue below to increase strength, balance, endurance for safe ADLs and gait.    Follow Up Recommendations SNF;Supervision - Intermittent;Supervision for mobility/OOB    Equipment Recommendations  None recommended by PT    Recommendations for Other Services       Precautions / Restrictions Precautions Precautions: Fall Restrictions Weight Bearing Restrictions: No      Mobility  Bed Mobility Overal bed mobility: Needs Assistance Bed Mobility: Supine to Sit;Sit to Supine     Supine to sit: Min guard Sit to supine: Min guard      Transfers Overall transfer level: Needs assistance Equipment used: Rolling walker (2 wheeled);1 person hand held assist;None Transfers: Sit to/from [facility] Sit to Stand: Min assist Stand pivot transfers: Min assist       General transfer comment: unsteady on feet with frequent leaning on nearby objects for support, safer using RW  Ambulation/Gait Ambulation/Gait assistance: Min assist Gait Distance (Feet): 50 Feet Assistive device: Rolling walker (2 wheeled);1 person hand held assist Gait Pattern/deviations: Decreased step length - right;Decreased step length - left;Decreased stride length Gait velocity: decreased   General Gait Details:  unsteady on feet having to lean on nearby objects for support when attempting gait without AD, safer using RW, able to ambuate with slow labored cadence, limited secondary to fatigue, no loss of balance    Stairs            Wheelchair Mobility    Modified Rankin (Stroke Patients Only)       Balance Overall balance assessment: Needs assistance Sitting-balance support: No upper extremity supported;Feet supported Sitting balance-Leahy Scale: Fair Sitting balance - Comments: fair/good seated at bedside   Standing balance support: No upper extremity supported;During functional activity Standing balance-Leahy Scale: Poor Standing balance comment: fair using RW                             Pertinent Vitals/Pain Pain Assessment: No/denies pain    Home Living Family/patient expects to be discharged to:: Private residence Living Arrangements: Alone Available Help at Discharge: Neighbor Type of Home: House Home Access: Stairs to enter Entrance Stairs-Rails: Right Entrance Stairs-Number of Steps: 2 Home Layout: One level Home Equipment: Environmental consultant - 2 wheels;Cane - quad;Bedside commode;Shower seat      Prior Function Level of Independence: Independent               Hand Dominance   Dominant Hand: Left    Extremity/Trunk Assessment   Upper Extremity Assessment Upper Extremity Assessment: Generalized weakness    Lower Extremity Assessment Lower Extremity Assessment: Generalized weakness    Cervical / Trunk Assessment Cervical / Trunk Assessment: Normal  Communication   Communication: No difficulties  Cognition Arousal/Alertness: Awake/alert Behavior During Therapy: WFL for tasks assessed/performed Overall Cognitive Status: Within Functional Limits for  tasks assessed                                        General Comments      Exercises     Assessment/Plan    PT Assessment Patient needs continued PT services  PT Problem List  Decreased strength;Decreased activity tolerance;Decreased balance;Decreased mobility       PT Treatment Interventions Gait training;Stair training;Functional mobility training;Therapeutic activities;Therapeutic exercise;Balance training;Patient/family education    PT Goals (Current goals can be found in the Care Plan section)  Acute Rehab PT Goals Patient Stated Goal: return home PT Goal Formulation: With patient Time For Goal Achievement: 09/16/18 Potential to Achieve Goals: Good    Frequency Min 3X/week   Barriers to discharge        Co-evaluation               AM-PAC PT "6 Clicks" Mobility  Outcome Measure Help needed turning from your back to your side while in a flat bed without using bedrails?: None Help needed moving from lying on your back to sitting on the side of a flat bed without using bedrails?: A Little Help needed moving to and from a bed to a chair (including a wheelchair)?: A Little Help needed standing up from a chair using your arms (e.g., wheelchair or bedside chair)?: A Little Help needed to walk in hospital room?: A Little Help needed climbing 3-5 steps with a railing? : A Lot 6 Click Score: 18    End of Session   Activity Tolerance: Patient tolerated treatment well;Patient limited by fatigue Patient left: in bed Nurse Communication: Mobility status PT Visit Diagnosis: Unsteadiness on feet (R26.81);Other abnormalities of gait and mobility (R26.89);Muscle weakness (generalized) (M62.81)    Time: 8185-6314 PT Time Calculation (min) (ACUTE ONLY): 24 min   Charges:   PT Evaluation $PT Eval Moderate Complexity: 1 Mod PT Treatments $Therapeutic Activity: 23-37 mins        3:28 PM, 09/02/18 Lonell Grandchild, MPT Physical Therapist with [facility] [phone] office [phone] mobile phone

## 2018-09-02 NOTE — ED Notes (Signed)
Date and time results received: 09/02/18 3:01 PM  (use smartphrase ".now" to insert current time)  Test: Calcium Critical Value: 6.4  Name of Provider Notified: Courtni PA  Orders Received? Or Actions Taken?: Orders Received - See Orders for details

## 2018-09-02 NOTE — TOC Initial Note (Signed)
Transition of Care [facility]) - Initial/Assessment Note    Patient Details  Name: [name] MRN: [id number] Date of Birth: 1936-01-11  Transition [facility]) CM/SW Contact:    Filomena Pokorney Dimitri Ped, LCSW Phone Number: 09/02/2018, 9:46 PM  Clinical Narrative:                 Pt in ED for SNF placement. Family aware of placement status. CSW will continue to follow pt for any discharge needs.   Expected Discharge Plan: Skilled Nursing Facility Barriers to Discharge: SNF Pending bed offer   Patient Goals and CMS Choice Patient states their goals for this hospitalization and ongoing recovery are:: to discharge to SNF for short term rehab CMS Medicare.gov Compare Post Acute Care list provided to:: Patient Choice offered to / list presented to : Patient  Expected Discharge Plan and Services Expected Discharge Plan: [place] In-house Referral: Clinical Social Work   Post Acute Care Choice: [place] arrangements for the past 2 months: [place]                                      Prior Living Arrangements/Services Living arrangements for the past 2 months: Single Family Home Lives with:: Self Patient language and need for interpreter reviewed:: Yes Do you feel safe going back to the place where you live?: Yes      Need for Family Participation in Patient Care: Yes (Comment) Care giver support system in place?: No (comment)   Criminal Activity/Legal Involvement Pertinent to Current Situation/Hospitalization: No - Comment as needed  Activities of Daily Living Home Assistive Devices/Equipment: Cane (specify quad or straight) ADL Screening (condition at time of admission) Patient's cognitive ability adequate to safely complete daily activities?: Yes Is the patient deaf or have difficulty hearing?: No Does the patient have difficulty seeing, even when wearing glasses/contacts?: No Does the patient have difficulty concentrating,  remembering, or making decisions?: No Patient able to express need for assistance with ADLs?: Yes Does the patient have difficulty dressing or bathing?: No Independently performs ADLs?: Yes (appropriate for developmental age) Does the patient have difficulty walking or climbing stairs?: Yes Weakness of Legs: Both Weakness of Arms/Hands: Both  Permission Sought/Granted Permission sought to share information with : Case Manager, Family Supports Permission granted to share information with : Yes, Verbal Permission Granted  Share Information with NAME: Mikiyah Glasner     Permission granted to share info w Relationship: Son  Permission granted to share info w Contact Information: Ph: [phone]  Emotional Assessment Appearance:: Appears stated age Attitude/Demeanor/Rapport: Engaged, Crying Affect (typically observed): Apprehensive Orientation: : Oriented to Self, Oriented to Place, Oriented to  Time, Oriented to Situation Alcohol / Substance Use: Not Applicable Psych Involvement: No (comment)  Admission diagnosis:  DIARRHEA Patient Active Problem List   Diagnosis Date Noted  . Malnutrition of moderate degree 08/28/2018  . Hypoxia 08/26/2018  . Hypokalemia 08/26/2018  . Sepsis due to undetermined organism ([place]) 06/26/2018  . Acute respiratory failure with hypoxia ([place]) 06/26/2018  . Lobar pneumonia ([place]) 06/26/2018  . Closed fracture of multiple pubic rami, right, with delayed healing, subsequent encounter 06/26/2018  . Closed fracture of ramus of right pubis ([place])   . Acute colitis 12/02/2016  . Uncontrolled hypertension 12/02/2016  . Enteritis 12/02/2016  . Hyponatremia 12/02/2016  . Status post total shoulder arthroplasty, left 11/21/2016  . COPD (chronic obstructive pulmonary  disease) ([place]) 11/21/2016  . Weakness 11/19/2016  . DJD of left shoulder 11/15/2016  . DOE (dyspnea on exertion) 11/11/2016  . Rotator cuff arthropathy of left shoulder 10/30/2016  .  Intertrochanteric fracture of right hip ([place]) 08/01/2015    Class: Acute  . SMA stenosis ([place]) 09/15/2014  . Abdominal pain, right upper quadrant 09/15/2014  . Essential hypertension 09/15/2014  . Depression 09/15/2014  . Hypothyroid 09/15/2014  . GERD without esophagitis 09/15/2014  . Lumbago 11/12/2013  . Stiffness of joint, not elsewhere classified, pelvic region and thigh 11/12/2013  . Muscle weakness (generalized) 11/12/2013  . Lack of coordination 03/21/2013  . Fine motor impairment 03/21/2013  . Left leg weakness 03/17/2013  . Risk for falls 03/17/2013  . Carotid artery disease ([place]) 03/11/2013  . Subclavian artery stenosis ([place]) 03/11/2013  . TIA (transient ischemic attack) 03/11/2013  . Tobacco use disorder 03/11/2013  . CVA (cerebral infarction) 03/05/2013  . Pain in joint, shoulder region 11/01/2010   PCP:  Sinda Du, MD Pharmacy:   [place] [place] - [address] AT [address] [place] [address] Phone: [phone] Fax: [phone]     Social Determinants of Health (SDOH) Interventions    Readmission Risk Interventions Readmission Risk Prevention Plan 08/29/2018  Transportation Screening Complete  PCP or Specialist Appt within 5-7 Days Complete  Home Care Screening Complete  Medication Review (RN CM) Complete  Some recent data might be hidden

## 2018-09-03 MED ORDER — DIPHENHYDRAMINE HCL 25 MG PO CAPS
25.0000 mg | ORAL_CAPSULE | Freq: Every evening | ORAL | Status: DC | PRN
  Administered 2018-09-03: 25 mg via ORAL
  Filled 2018-09-03: qty 1

## 2018-09-03 MED ORDER — CALCIUM GLUCONATE-NACL 1-0.675 GM/50ML-% IV SOLN
1.0000 g | INTRAVENOUS | Status: AC
  Administered 2018-09-03 (×2): 1000 mg via INTRAVENOUS
  Filled 2018-09-03 (×2): qty 50

## 2018-09-03 NOTE — ED Notes (Signed)
[name] with social work at bedside.

## 2018-09-03 NOTE — ED Notes (Addendum)
Social Work at bedside to update patient.

## 2018-09-03 NOTE — ED Notes (Signed)
Pt given breakfast meal tray. 

## 2018-09-03 NOTE — ED Notes (Signed)
Pt placed on hospital bed for more comfort

## 2018-09-03 NOTE — Clinical Social Work Note (Signed)
Met with patient to confirm plans going forward.  She states she is open to going to rehab, that she has been at [facility] in past for rehab when she had shoulder surgery a couple of years ago, and that would be her first choice.  She identified the 2 [place] facilities as plans b and c, in no particular order.  Sent referral to [facility] and was offered tentative bed by [facility] pending approval by [facility], to whom I called and gave required information.  Awaiting call back.

## 2018-09-03 NOTE — ED Notes (Signed)
Pt given lunch meal tray. 

## 2018-09-03 NOTE — ED Notes (Signed)
RT notified for neb tx. 

## 2018-09-03 NOTE — ED Notes (Signed)
Patient states she had diarrhea around 7am this morning and requested lomotil.

## 2018-09-04 DIAGNOSIS — G8929 Other chronic pain: Secondary | ICD-10-CM | POA: Diagnosis not present

## 2018-09-04 DIAGNOSIS — M6281 Muscle weakness (generalized): Secondary | ICD-10-CM | POA: Diagnosis not present

## 2018-09-04 DIAGNOSIS — Z9181 History of falling: Secondary | ICD-10-CM | POA: Diagnosis not present

## 2018-09-04 DIAGNOSIS — I771 Stricture of artery: Secondary | ICD-10-CM | POA: Diagnosis not present

## 2018-09-04 DIAGNOSIS — E440 Moderate protein-calorie malnutrition: Secondary | ICD-10-CM | POA: Diagnosis not present

## 2018-09-04 DIAGNOSIS — M797 Fibromyalgia: Secondary | ICD-10-CM | POA: Diagnosis not present

## 2018-09-04 DIAGNOSIS — K591 Functional diarrhea: Secondary | ICD-10-CM | POA: Diagnosis not present

## 2018-09-04 DIAGNOSIS — M1990 Unspecified osteoarthritis, unspecified site: Secondary | ICD-10-CM | POA: Diagnosis not present

## 2018-09-04 DIAGNOSIS — E871 Hypo-osmolality and hyponatremia: Secondary | ICD-10-CM | POA: Diagnosis not present

## 2018-09-04 DIAGNOSIS — F329 Major depressive disorder, single episode, unspecified: Secondary | ICD-10-CM | POA: Diagnosis not present

## 2018-09-04 DIAGNOSIS — R45851 Suicidal ideations: Secondary | ICD-10-CM | POA: Diagnosis not present

## 2018-09-04 DIAGNOSIS — S32591G Other specified fracture of right pubis, subsequent encounter for fracture with delayed healing: Secondary | ICD-10-CM | POA: Diagnosis not present

## 2018-09-04 DIAGNOSIS — R627 Adult failure to thrive: Secondary | ICD-10-CM | POA: Diagnosis not present

## 2018-09-04 DIAGNOSIS — I2510 Atherosclerotic heart disease of native coronary artery without angina pectoris: Secondary | ICD-10-CM | POA: Diagnosis not present

## 2018-09-04 DIAGNOSIS — E876 Hypokalemia: Secondary | ICD-10-CM | POA: Diagnosis not present

## 2018-09-04 DIAGNOSIS — F5104 Psychophysiologic insomnia: Secondary | ICD-10-CM | POA: Diagnosis not present

## 2018-09-04 DIAGNOSIS — Z96612 Presence of left artificial shoulder joint: Secondary | ICD-10-CM | POA: Diagnosis not present

## 2018-09-04 DIAGNOSIS — F411 Generalized anxiety disorder: Secondary | ICD-10-CM | POA: Diagnosis not present

## 2018-09-04 DIAGNOSIS — E039 Hypothyroidism, unspecified: Secondary | ICD-10-CM | POA: Diagnosis not present

## 2018-09-04 DIAGNOSIS — K219 Gastro-esophageal reflux disease without esophagitis: Secondary | ICD-10-CM | POA: Diagnosis not present

## 2018-09-04 DIAGNOSIS — I10 Essential (primary) hypertension: Secondary | ICD-10-CM | POA: Diagnosis not present

## 2018-09-04 DIAGNOSIS — R1084 Generalized abdominal pain: Secondary | ICD-10-CM | POA: Diagnosis not present

## 2018-09-04 DIAGNOSIS — I639 Cerebral infarction, unspecified: Secondary | ICD-10-CM | POA: Diagnosis not present

## 2018-09-04 DIAGNOSIS — R109 Unspecified abdominal pain: Secondary | ICD-10-CM | POA: Diagnosis not present

## 2018-09-04 DIAGNOSIS — I6523 Occlusion and stenosis of bilateral carotid arteries: Secondary | ICD-10-CM | POA: Diagnosis not present

## 2018-09-04 DIAGNOSIS — F17200 Nicotine dependence, unspecified, uncomplicated: Secondary | ICD-10-CM | POA: Diagnosis not present

## 2018-09-04 DIAGNOSIS — R262 Difficulty in walking, not elsewhere classified: Secondary | ICD-10-CM | POA: Diagnosis not present

## 2018-09-04 DIAGNOSIS — N3000 Acute cystitis without hematuria: Secondary | ICD-10-CM | POA: Diagnosis not present

## 2018-09-04 DIAGNOSIS — J449 Chronic obstructive pulmonary disease, unspecified: Secondary | ICD-10-CM | POA: Diagnosis not present

## 2018-09-04 LAB — URINE CULTURE: Culture: >=100000 — AB

## 2018-09-04 NOTE — NC FL2 (Signed)
[place] LEVEL OF CARE SCREENING TOOL     IDENTIFICATION  Patient Name: [name] Birthdate: [birth year] Sex: female Admission Date (Current Location): 09/02/2018  [facility] and [place] Number:  [employer] and Address:  [place] [address], [place]      Provider Number: [id number]  Attending Physician Name and Address:  Default, Provider, MD  Relative Name and Phone Number:  Kylynn Street Ph:[phone]    Current Level of Care: Hospital Recommended Level of Care: [place] Prior Approval Number: [id number]  Date Approved/Denied: 08/03/15 PASRR Number: [id number]  Discharge Plan: SNF    Current Diagnoses: Patient Active Problem List   Diagnosis Date Noted  . Malnutrition of moderate degree 08/28/2018  . Hypoxia 08/26/2018  . Hypokalemia 08/26/2018  . Sepsis due to undetermined organism ([place]) 06/26/2018  . Acute respiratory failure with hypoxia ([place]) 06/26/2018  . Lobar pneumonia ([place]) 06/26/2018  . Closed fracture of multiple pubic rami, right, with delayed healing, subsequent encounter 06/26/2018  . Closed fracture of ramus of right pubis ([place])   . Acute colitis 12/02/2016  . Uncontrolled hypertension 12/02/2016  . Enteritis 12/02/2016  . Hyponatremia 12/02/2016  . Status post total shoulder arthroplasty, left 11/21/2016  . COPD (chronic obstructive pulmonary disease) ([place]) 11/21/2016  . Weakness 11/19/2016  . DJD of left shoulder 11/15/2016  . DOE (dyspnea on exertion) 11/11/2016  . Rotator cuff arthropathy of left shoulder 10/30/2016  . Intertrochanteric fracture of right hip ([place]) 08/01/2015    Class: Acute  . SMA stenosis ([place]) 09/15/2014  . Abdominal pain, right upper quadrant 09/15/2014  . Essential hypertension 09/15/2014  . Depression 09/15/2014  . Hypothyroid 09/15/2014  . GERD without esophagitis 09/15/2014  . Lumbago 11/12/2013  . Stiffness of joint, not elsewhere  classified, pelvic region and thigh 11/12/2013  . Muscle weakness (generalized) 11/12/2013  . Lack of coordination 03/21/2013  . Fine motor impairment 03/21/2013  . Left leg weakness 03/17/2013  . Risk for falls 03/17/2013  . Carotid artery disease ([place]) 03/11/2013  . Subclavian artery stenosis ([place]) 03/11/2013  . TIA (transient ischemic attack) 03/11/2013  . Tobacco use disorder 03/11/2013  . CVA (cerebral infarction) 03/05/2013  . Pain in joint, shoulder region 11/01/2010    Orientation RESPIRATION BLADDER Height & Weight     Self, Time, Situation, Place  Normal Continent(wears adult inserts to prevent accidents) Weight: 47.6 kg Height:  5\' 1"  (154.9 cm)  BEHAVIORAL SYMPTOMS/MOOD NEUROLOGICAL BOWEL NUTRITION STATUS      Continent    AMBULATORY STATUS COMMUNICATION OF NEEDS Skin   Limited Assist Verbally Normal                       Personal Care Assistance Level of Assistance  Bathing, Feeding, Dressing Bathing Assistance: Limited assistance Feeding assistance: Independent Dressing Assistance: Limited assistance     Functional Limitations Info  Sight Sight Info: Impaired        SPECIAL CARE FACTORS FREQUENCY  PT (By licensed PT), OT (By licensed OT)     PT Frequency: 5x weekly OT Frequency: 5x weekly            Contractures Contractures Info: Not present    Additional Factors Info  Code Status, Allergies Code Status Info: DNR Allergies Info: Aleve (Naproxen Sodium) Hydrocodone Statins Celebrex (Celecoxib) Codeine Morphine And Related           Current Medications (09/04/2018):  This is the current hospital active  medication list Current Facility-Administered Medications  Medication Dose Route Frequency Provider Last Rate Last Dose  . aspirin EC tablet 81 mg  81 mg Oral Daily Couture, Cortni S, PA-C   81 mg at 09/04/18 1121  . calcium-vitamin D (OSCAL WITH D) 500-200 MG-UNIT per tablet 1 tablet  1 tablet Oral Once Couture, Cortni S, PA-C      .  cephALEXin (KEFLEX) capsule 500 mg  500 mg Oral BID Couture, Cortni S, PA-C   500 mg at 09/03/18 2127  . diltiazem (CARDIZEM CD) 24 hr capsule 240 mg  240 mg Oral Daily Couture, Cortni S, PA-C   240 mg at 09/04/18 1121  . diphenhydrAMINE (BENADRYL) capsule 25 mg  25 mg Oral QHS PRN Francine Graven, DO   25 mg at 09/03/18 2127  . diphenoxylate-atropine (LOMOTIL) 2.5-0.025 MG per tablet 1 tablet  1 tablet Oral QID PRN Couture, Cortni S, PA-C   1 tablet at 09/03/18 0842  . DULoxetine (CYMBALTA) DR capsule 30 mg  30 mg Oral Daily Couture, Cortni S, PA-C   30 mg at 09/04/18 1122  . metroNIDAZOLE (FLAGYL) tablet 250 mg  250 mg Oral TID PC Couture, Cortni S, PA-C   250 mg at 09/04/18 0927  . multivitamin with minerals tablet 1 tablet  1 tablet Oral Daily Couture, Cortni S, PA-C   1 tablet at 09/04/18 1122  . niacin CR capsule 500 mg  500 mg Oral QHS Couture, Cortni S, PA-C   500 mg at 09/03/18 2128  . omega-3 acid ethyl esters (LOVAZA) capsule 1 g  1 capsule Oral Daily Couture, Cortni S, PA-C   1 g at 09/04/18 1121  . pantoprazole (PROTONIX) EC tablet 40 mg  40 mg Oral Daily Couture, Cortni S, PA-C   40 mg at 09/04/18 1121  . potassium chloride SA (K-DUR) CR tablet 10 mEq  10 mEq Oral Daily Couture, Cortni S, PA-C   10 mEq at 09/04/18 1121  . traMADol (ULTRAM) tablet 50 mg  50 mg Oral Q6H PRN Couture, Cortni S, PA-C      . vitamin C (ASCORBIC ACID) tablet 1,000 mg  1,000 mg Oral BID Couture, Cortni S, PA-C   1,000 mg at 09/04/18 1122   Current Outpatient Medications  Medication Sig Dispense Refill  . Ascorbic Acid (VITAMIN C) 1000 MG tablet Take 1,000 mg by mouth 2 (two) times daily.     Marland Kitchen aspirin EC 81 MG tablet Take 81 mg by mouth daily.    . Calcium Carb-Cholecalciferol (CALCIUM 600 + D PO) Take 1-2 tablets by mouth See admin instructions. 2 tabs in the morning, and 1 tab at [facility]    . diltiazem (CARDIZEM CD) 240 MG 24 hr capsule TAKE 1 CAPSULE(240 MG) BY MOUTH DAILY (Patient taking differently: Take  240 mg by mouth daily. ) 60 capsule 0  . diphenhydrAMINE (BENADRYL) 25 mg capsule Take 25 mg by mouth at bedtime.    . diphenoxylate-atropine (LOMOTIL) 2.5-0.025 MG tablet Take 1 tablet by mouth 4 (four) times daily as needed for diarrhea or loose stools. 30 tablet 1  . DULoxetine (CYMBALTA) 30 MG capsule Take 30 mg by mouth daily.     . Flaxseed, Linseed, (FLAXSEED OIL) 1000 MG CAPS Take 1 capsule by mouth 2 (two) times daily.    . Multiple Vitamin (MULTIVITAMIN WITH MINERALS) TABS Take 1 tablet by mouth daily.    . niacin 500 MG CR capsule Take 500 mg by mouth at bedtime.    . Omega-3  1000 MG CAPS Take 1 capsule by mouth daily.    . pantoprazole (PROTONIX) 40 MG tablet Take 1 tablet (40 mg total) by mouth daily. 30 tablet 11  . traMADol (ULTRAM) 50 MG tablet Take 1 tablet (50 mg total) by mouth every 6 (six) hours as needed for moderate pain. 20 tablet 1     Discharge Medications: Please see discharge summary for a list of discharge medications.  Relevant Imaging Results:  Relevant Lab Results:   Additional Information SSN [place], [place]

## 2018-09-04 NOTE — TOC Transition Note (Signed)
Transition of Care [facility]) - CM/SW Discharge Note   Patient Details  Name: [name] MRN: [id number] Date of Birth: 02/03/1936  Transition of Care [facility]) CM/SW Contact:  Trish Mage, LCSW Phone Number: 09/04/2018, 1:58 PM   Clinical Narrative:   Pt was initially denied authorization by [facility] [employer], but later approved per appeal.  They approved 7 days; auth # is [id number].  Went to tell patient the news.  When told that she had been approved for 7 days, and that rehab is generally a 14-21 day period, she put down her fork and told me in no uncertain terms that she planned to be there for 3-4 days.  Further conversation revealed a fiercely independent woman who does not want to be a burden to anyone.  With patient permission, called [name] with patient present. [phone]  He was very reassuring to his mother.  He stated they had already talked about the next steps, and indicated a willingness to either pay to have someone come into the home, or to have her come live with him in [place].  Patients response was to tell him he had no room for her in the home, and no money to pay for someone to come into the home.  Son continued to reassure his mother that her needs would be provided for. Pt to transfer today.  Dr alerted, and he signed FL2.  Nursing alerted, and they called report. COVID test came back negative  No further needs. CSW sign off.    Final next level of care: Skilled Nursing Facility Barriers to Discharge: No Barriers Identified   Patient Goals and CMS Choice Patient states their goals for this hospitalization and ongoing recovery are:: to discharge to SNF for short term rehab CMS Medicare.gov Compare Post Acute Care list provided to:: Patient Choice offered to / list presented to : Patient  Discharge Placement   Existing PASRR number confirmed : 09/03/18          Patient [facility] bed at: [facility] Patient to be transferred to facility by: staff via  tunnel Name of family member notified: [name], son Patient and family notified of of transfer: 09/04/18  Discharge Plan and Services In-house Referral: Clinical Social Work   Post Acute Care Choice: Home Health                               Social Determinants of Health (SDOH) Interventions     Readmission Risk Interventions Readmission Risk Prevention Plan 08/29/2018  Transportation Screening Complete  PCP or Specialist Appt within 5-7 Days Complete  Home Care Screening Complete  Medication Review (RN CM) Complete  Some recent data might be hidden

## 2018-09-04 NOTE — ED Notes (Signed)
Pt given de-caf coffee and graham crackers.  

## 2018-09-04 NOTE — Therapy (Signed)
Physical Therapy Treatment Patient Details Name: JONISHA KINDIG MRN: [id number] DOB: Jan 29, 1936 Today's Date: 09/04/2018       PT Comments   Pt eager to work with therapy today.  Noted to be laying close to edge of bed when entered, recommended she lay more centrally in bed.  Pt verbalized understanding.  Pt able to come to EOB and transfer to standing with cues for safety and supervision.  Ambulated 100 feet with RW without any LOB or issues.  Pt does need general cues for safety, however. Returned to bed with call bell in hand.                 Precautions / Restrictions Precautions Precautions: Fall Restrictions Weight Bearing Restrictions: No    Mobility  Bed Mobility    supervision and cues for safety with supine to sit, sit to supine              Transfers    supervision and cues for safety with transferring sit to and from supine.                Ambulation/Gait   Gait Distance (Feet): 100 Feet with RW and CGA for safely     Gait velocity: decreased       Stairs  Not completed             Wheelchair Mobility    Modified Rankin (Stroke Patients Only)       Balance       Sitting balance - Comments: fair/good seated at bedside                                    Cognition     Overall Cognitive Status: Within Functional Limits for tasks assessed                                             PT Goals (current goals can now be found in the care plan section)         PT Plan  continue with POC          End of Session  Pt in bed with call bell in reach             Time:  11:45-12:10    Charges: gait X 1                      Teena Irani, PTA/CLT [place], Amy B 09/04/2018, 1:37 PM

## 2018-09-05 ENCOUNTER — Encounter: Payer: Self-pay | Admitting: Adult Health

## 2018-09-05 ENCOUNTER — Non-Acute Institutional Stay (SKILLED_NURSING_FACILITY): Payer: PPO | Admitting: Adult Health

## 2018-09-05 ENCOUNTER — Inpatient Hospital Stay
Admission: RE | Admit: 2018-09-05 | Discharge: 2018-09-11 | Disposition: A | Discharge status: Home / Self Care | Payer: PPO | Source: Ambulatory Visit | Attending: Internal Medicine | Admitting: Internal Medicine

## 2018-09-05 DIAGNOSIS — K219 Gastro-esophageal reflux disease without esophagitis: Secondary | ICD-10-CM

## 2018-09-05 DIAGNOSIS — I639 Cerebral infarction, unspecified: Secondary | ICD-10-CM | POA: Diagnosis not present

## 2018-09-05 DIAGNOSIS — F5104 Psychophysiologic insomnia: Secondary | ICD-10-CM | POA: Diagnosis not present

## 2018-09-05 DIAGNOSIS — I771 Stricture of artery: Secondary | ICD-10-CM | POA: Diagnosis not present

## 2018-09-05 DIAGNOSIS — R45851 Suicidal ideations: Secondary | ICD-10-CM

## 2018-09-05 DIAGNOSIS — G8929 Other chronic pain: Secondary | ICD-10-CM

## 2018-09-05 DIAGNOSIS — S32591G Other specified fracture of right pubis, subsequent encounter for fracture with delayed healing: Secondary | ICD-10-CM | POA: Diagnosis not present

## 2018-09-05 DIAGNOSIS — J449 Chronic obstructive pulmonary disease, unspecified: Secondary | ICD-10-CM

## 2018-09-05 DIAGNOSIS — I10 Essential (primary) hypertension: Secondary | ICD-10-CM | POA: Diagnosis not present

## 2018-09-05 DIAGNOSIS — I6523 Occlusion and stenosis of bilateral carotid arteries: Secondary | ICD-10-CM | POA: Diagnosis not present

## 2018-09-05 DIAGNOSIS — E876 Hypokalemia: Secondary | ICD-10-CM

## 2018-09-05 DIAGNOSIS — R1084 Generalized abdominal pain: Secondary | ICD-10-CM

## 2018-09-05 DIAGNOSIS — F329 Major depressive disorder, single episode, unspecified: Secondary | ICD-10-CM

## 2018-09-05 NOTE — ED Notes (Signed)
This patient was not take off the screen by night shift nurse.

## 2018-09-05 NOTE — Progress Notes (Signed)
Location:   [facility] Room Number: 154 D Place of Service:  SNF (31)   CODE STATUS: DNR  Allergies  Allergen Reactions  . Aleve [Naproxen Sodium] Hives and Palpitations  . Hydrocodone Nausea Only  . Statins Other (See Comments)    Muscle pain  . Celebrex [Celecoxib] Nausea And Vomiting  . Codeine Nausea And Vomiting  . Morphine And Related Nausea And Vomiting    Chief Complaint  Patient presents with  . Hospitalization Follow-up    Hospital Follow up    HPI:  She is a 83 year old who has had several trips to the ED and has been hospitalized earlier this month. She was taken to the ED for "wanting to die" . She did not have a plan; but did express desire to passively wanting to die. She is here for short term rehab. Her goal is to return back home. She has chronic abdominal pain and tenderness. She states that the only way she can sleep is with benadryl. She denies any excessive fatigue. She will continue to be followed for her chronic illnesses including: copd; hypertension; gerd.   Past Medical History:  Diagnosis Date  . Anxiety   . Arthritis   . Carotid artery disease ([place]) 2007   R CEA; dopplers 10/2016:  R ICA moderate (non-obstructive plaque) ; L ICA ~50-69%, but may underestimate. (reassess next with either  CTA versus formal cerebral angiogram)   . Constipation due to pain medication   . COPD (chronic obstructive pulmonary disease) ([place])   . Depression   . Diverticulosis   . Dyspnea   . Fibromyalgia   . Gastritis   . GERD (gastroesophageal reflux disease)   . Head injury   . History of pneumonia   . History of stroke 20/1007   complication of R SubClav A PTA -- R hemispheric CVA  . Hypertension   . Hypothyroidism   . Osteoarthritis   . Peripheral vascular disease ([place])    Bilateral subclavian artery disease (status post R SubClav A PTA).  R side CEA (with US findings of Mod-Severe L CIA stenosis).  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   . Pneumonia   . Stroke ([place])   . Urgency incontinence     Past Surgical History:  Procedure Laterality Date  . ANTERIOR AND POSTERIOR REPAIR  12/2000   Archie Endo 07/04/2010  . ANTERIOR CERVICAL DECOMP/DISCECTOMY FUSION N/A 04/03/2012   Procedure: ANTERIOR CERVICAL DECOMPRESSION/DISCECTOMY FUSION 2 LEVELS;  Surgeon: Hosie Spangle, MD;  Location: [facility] NEURO ORS;  Service: Neurosurgery;  Laterality: N/A;  Cervical four-five,Cervical five-six anterior cervical decompression with fusion plating and bonegraft  . BACK SURGERY    . BREAST SURGERY Right 02/2007   Archie Endo 06/23/2010  . BUNIONECTOMY WITH HAMMERTOE RECONSTRUCTION Left 09/10/2007   Archie Endo 06/23/2010  . CAROTID ENDARTERECTOMY Right 2007   [facility], Dr. Lucky Cowboy  . CATARACT EXTRACTION W/ INTRAOCULAR LENS IMPLANT Left    Archie Endo 06/23/2010  . CHOLECYSTECTOMY    . COLONOSCOPY N/A 07/15/2014   Procedure: COLONOSCOPY;  Surgeon: Rogene Houston, MD;  [facility];  Service: Endoscopy;  Laterality: N/A;  200 - moved to 2:10 - Ann to notify pt  . COMPRESSION HIP SCREW Right 08/01/2015   Procedure: COMPRESSION HIP;  Surgeon: Carole Civil, MD;  Location: [facility] OR[facility];  Service: Orthopedics;  Laterality: Right;  . ESOPHAGOGASTRODUODENOSCOPY N/A 07/15/2014   Procedure: ESOPHAGOGASTRODUODENOSCOPY (EGD);  Surgeon: Rogene Houston, MD;  Location: [facility];  Service: Endoscopy;  Laterality: N/A;  . EYE SURGERY     cataract /lens both eyes  . FRACTURE SURGERY    . INCONTINENCE SURGERY  12/2000   Tension-free transvaginal tape procedure/notes 07/04/2010  . IR ANGIOGRAM SELECTIVE EACH ADDITIONAL VESSEL  01/21/2018  . IR ANGIOGRAM VISCERAL SELECTIVE  01/21/2018  . IR PTA NON CORO-LOWER EXTREM  01/21/2018  . IR RADIOLOGIST EVAL & MGMT  12/11/2017  . IR TRANSCATH PLC STENT 1ST ART NOT LE CV CAR VERT CAR  01/21/2018  . IR US GUIDE VASC ACCESS RIGHT  01/21/2018  . JOINT REPLACEMENT    . LUMBAR FUSION  08/2006   Archie Endo 06/23/2010  .  PERIPHERAL VASCULAR BALLOON ANGIOPLASTY  02/2013   Aortic arch angiography revealed 70% innominate/R subclavian stenosis along with 60% left common carotid ostial stenosis and moderate left subclavian artery stenosis. ->  Innominate artery PTA with 7 mm balloon reducing stenosis to roughly 30%.  Marland Kitchen PERIPHERAL VASCULAR CATHETERIZATION N/A 10/08/2014   Procedure: Visceral Angiography;  Surgeon: Algernon Huxley, MD;  Location: [facility] CV LAB;  Service: Cardiovascular;  - long stenosis/occlusion of SMA. 75% celiac artery (PTA with 6 mm balloon)  . PERIPHERAL VASCULAR CATHETERIZATION N/A 10/08/2014   Procedure: Visceral Artery Intervention;  Surgeon: Algernon Huxley, MD;  Location: [facility];  Service: Cardiovascular;: 6 mm PTA balloon to stenosed CELIAC ARTERY  . ROTATOR CUFF REPAIR Left X 3  . TOTAL KNEE ARTHROPLASTY Bilateral 2005; 2011   right; left  . TOTAL SHOULDER ARTHROPLASTY Left 11/15/2016  . TOTAL SHOULDER ARTHROPLASTY Left 11/15/2016   Procedure: LEFT TOTAL SHOULDER ARTHROPLASTY;  Surgeon: [name], MD;  Location: [facility];  Service: Orthopedics;  Laterality: Left;  . TRANSTHORACIC ECHOCARDIOGRAM  10/2016   Normal LV wall thickness with LVEF 60-65% and grade 1 diastolic dysfunction.  Aortic valve calcific sclerosis with no stenosis.  Mildly calcified mitral annulus and leaflets.   Marland Kitchen VAGINAL HYSTERECTOMY  12/2000   Archie Endo 07/04/2010    Social History   Socioeconomic History  . Marital status: Widowed    Spouse name: Not on file  . Number of children: 3  . Years of education: 71  . Highest education level: Not on file  Occupational History  . 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  Social Needs  . Financial resource strain: Not hard at all  . Food insecurity    Worry: Never true    Inability: Never true  . Transportation needs    Medical: No    Non-medical: No  Tobacco Use  . Smoking status: Former Smoker     Packs/day: 0.12    Years: 63.00    Pack years: 7.56    Types: Cigarettes  . Smokeless tobacco: Never Used  Substance and Sexual Activity  . Alcohol use: No    Alcohol/week: 0.0 standard drinks  . Drug use: No  . Sexual activity: Never    Birth control/protection: Abstinence  Lifestyle  . Physical activity    Days per week: 3 days    Minutes per session: Not on file  . Stress: To some extent  Relationships  . Social connections    Talks on phone: More than three times a week    Gets together: Not on file    Attends religious service: Not on file    Active member of club or organization: Not on file  Attends meetings of clubs or organizations: Not on file    Relationship status: Not on file  . Intimate partner violence    Fear of current or ex partner: No    Emotionally abused: No    Physically abused: No    Forced sexual activity: No  Other Topics Concern  . Not on file  Social History Narrative    She is widowed. She is the mother 70 grandchildren. She lives alone. She works as a Electrical engineer for [employer]   . She exercises at the [facility] pole 45 at 60 minutes of time 3 days a week.   Family History  Problem Relation Age of Onset  . Pancreatic cancer Mother   . Hypertension Mother   . Prostate cancer Father       VITAL SIGNS BP 137/81   Pulse 75   Temp 98.1 F (36.7 C)   Resp 20   Ht 5' 1.5" (1.562 m)   Wt 108 lb 3.2 oz (49.1 kg)   SpO2 95%   BMI 20.11 kg/m   Outpatient Encounter Medications as of 09/05/2018  Medication Sig  . Ascorbic Acid (VITAMIN C) 1000 MG tablet Take 1,000 mg by mouth 2 (two) times daily.   Marland Kitchen aspirin EC 81 MG tablet Take 81 mg by mouth daily.  . Calcium Carb-Cholecalciferol (CALCIUM 600 + D PO) Take 1 tablet by mouth 3 (three) times daily.   Marland Kitchen diltiazem (DILACOR XR) 240 MG 24 hr capsule Take 240 mg by mouth daily.  . diphenoxylate-atropine (LOMOTIL) 2.5-0.025 MG tablet Take 1 tablet by mouth 4 (four) times daily as needed for  diarrhea or loose stools.  . DULoxetine (CYMBALTA) 30 MG capsule Take 30 mg by mouth daily.   . Flaxseed, Linseed, (FLAXSEED OIL) 1000 MG CAPS Take 1 capsule by mouth 2 (two) times daily.  . metroNIDAZOLE (FLAGYL) 250 MG tablet Take 250 mg by mouth 3 (three) times daily. X 5 more days  . Multiple Vitamin (MULTIVITAMIN WITH MINERALS) TABS Take 1 tablet by mouth daily.  . niacin 500 MG CR capsule Take 500 mg by mouth at bedtime.  . NON FORMULARY Diet type:  NAS  . Omega-3 1000 MG CAPS Take 1 capsule by mouth daily.  . OXYGEN Inhale into the lungs.  . pantoprazole (PROTONIX) 40 MG tablet Take 1 tablet (40 mg total) by mouth daily.  . traMADol (ULTRAM) 50 MG tablet Take 1 tablet (50 mg total) by mouth every 6 (six) hours as needed for moderate pain.   No facility-administered encounter medications on file as of 09/05/2018.      SIGNIFICANT DIAGNOSTIC EXAMS  TODAY:   08-26-18: CT angio of abdomen and pelvis: VASCULAR 1. Stable chronic occlusion of the SMA. There is distal patency of the SMA. IMA appears diminutive but is grossly patent. Mesenteric vessels appear diffusely attenuated. 2. Extensive aortic atherosclerosis of the abdominal aorta and branch vessels without high-grade stenosis or acute occlusive disease. NON-VASCULAR 1. Negative for bowel wall thickening or acute colitis. Diffuse fluid within the small and large bowel, possibly due to enteritis 2. Healing right inferior pubic ramus fracture 3. Sigmoid colon diverticular disease without acute inflammatory process   09-02-18: acute abdomen and chest x-ray: Negative abdominal radiographs. No acute cardiopulmonary disease.   LABS REVIEWED TODAY:   08-29-18: vit D 26.0 09-02-18: wbc 7.8; hgb 12.6; hct 35.7; mcv 87.9; plt 411; glucose 91; bun 13; creat 0.55; k+ 3.0; na++ 129; ca 6.4; liver normal albumin 3.2; urine  culture: klebsiella pneumoniae   Review of Systems  Constitutional: Positive for malaise/fatigue.  Respiratory: Negative  for cough and shortness of breath.   Cardiovascular: Negative for chest pain, palpitations and leg swelling.  Gastrointestinal: Positive for abdominal pain, diarrhea and nausea. Negative for constipation and heartburn.  Musculoskeletal: Negative for back pain, joint pain and myalgias.  Skin: Negative.   Neurological: Negative for dizziness.  Psychiatric/Behavioral: The patient is not nervous/anxious.      Physical Exam Constitutional:      General: She is not in acute distress.    Appearance: She is not diaphoretic.     Comments: Frail   Eyes:     Comments: Bilateral cataract removal with lens implants   Neck:     Musculoskeletal: Neck supple.     Thyroid: No thyromegaly.     Comments: Right carotid endarterectomy  Cardiovascular:     Rate and Rhythm: Normal rate and regular rhythm.     Heart sounds: Normal heart sounds.     Comments: Pedal pulses faint Pulmonary:     Effort: Pulmonary effort is normal. No respiratory distress.     Breath sounds: Wheezing present.     Comments: Few and scattered Abdominal:     General: Bowel sounds are normal. There is no distension.     Palpations: Abdomen is soft.     Tenderness: There is abdominal tenderness.     Comments: Has generalized tenderness present   Musculoskeletal: Normal range of motion.     Right lower leg: No edema.     Left lower leg: No edema.     Comments: May 2020: pubic rami fracture  2014: cervical fusion 2017: right hip compression screw  Lumbar fusion Left shoulder replacement   Lymphadenopathy:     Cervical: No cervical adenopathy.  Skin:    General: Skin is warm and dry.     Comments: Lower extremities discolored   Neurological:     Mental Status: She is alert and oriented to person, place, and time.  Psychiatric:        Mood and Affect: Mood normal.     ASSESSMENT/ PLAN:  TODAY;   1. Chronic obstructive pulmonary disease unspecified copd type: is stable has 02 as needed will begin xopenex 0.63 mg  neb treatment every 4 hours as needed for wheezing.  will monitor her status.   2. Essential hypertension: is stable b/p 137/81: will continue diltiazem xr 240 mg daily   3. Subclavian artery stenosis/bilateral carotid stenosis: history of right carotid endarterectomy: will continue asa 81 mg daily   4. GERD without esophagitis: is stable will continue protonix 40 mg daily   5. Cerebral infarction/unspecified mechanism: is stable will continue asa 81 mg daily   6. Chronic generalized pain: is without change: will continue cymbalta 30 mg daily will continue ultram 50 mg every 6 hours as needed through 09-09-18  7.  Closed fracture of multiple pubic rami, right, with delayed healing subsequent encounter: May 2020: is stable will continue ultram 50 mg every 6 hours as needed through 09-09-18.   8. Depression with suicidal ideation: is without change: will continue cymbalta 30 mg daily. She did not express a plan; she did express passive thoughts of wanting to die.  9. Hypokalemia: is without change k+ 3.0; will repeat bmp in the AM.   10. Chronic insomnia: without change: will begin her on benadryl 25 mg nightly       MD is aware of resident's narcotic use and is  in agreement with current plan of care. We will attempt to wean resident as apropriate   Ok Edwards NP [facility]  Contact [phone] Monday through Friday 8am- 5pm  After hours call [phone]

## 2018-09-06 ENCOUNTER — Other Ambulatory Visit: Payer: Self-pay | Admitting: Adult Health

## 2018-09-06 ENCOUNTER — Non-Acute Institutional Stay (SKILLED_NURSING_FACILITY): Payer: PPO | Admitting: Adult Health

## 2018-09-06 ENCOUNTER — Encounter: Payer: Self-pay | Admitting: Adult Health

## 2018-09-06 ENCOUNTER — Other Ambulatory Visit (HOSPITAL_COMMUNITY)
Admission: RE | Admit: 2018-09-06 | Discharge: 2018-09-06 | Disposition: A | Discharge status: Home / Self Care | Payer: Self-pay | Source: Skilled Nursing Facility | Attending: Adult Health | Admitting: Adult Health

## 2018-09-06 DIAGNOSIS — E876 Hypokalemia: Secondary | ICD-10-CM

## 2018-09-06 DIAGNOSIS — E871 Hypo-osmolality and hyponatremia: Secondary | ICD-10-CM | POA: Diagnosis not present

## 2018-09-06 DIAGNOSIS — E440 Moderate protein-calorie malnutrition: Secondary | ICD-10-CM | POA: Insufficient documentation

## 2018-09-06 DIAGNOSIS — R197 Diarrhea, unspecified: Secondary | ICD-10-CM | POA: Insufficient documentation

## 2018-09-06 DIAGNOSIS — J449 Chronic obstructive pulmonary disease, unspecified: Secondary | ICD-10-CM | POA: Diagnosis not present

## 2018-09-06 DIAGNOSIS — K591 Functional diarrhea: Secondary | ICD-10-CM

## 2018-09-06 LAB — BASIC METABOLIC PANEL
Anion gap: 9 (ref 5–15)
BUN: 18 mg/dL (ref 8–23)
CO2: 26 mmol/L (ref 22–32)
Calcium: 7 mg/dL — ABNORMAL LOW (ref 8.9–10.3)
Chloride: 97 mmol/L — ABNORMAL LOW (ref 98–111)
Creatinine, Ser: 0.47 mg/dL (ref 0.44–1.00)
GFR calc Af Amer: >60 mL/min (ref >60)
GFR calc non Af Amer: >60 mL/min (ref >60)
Glucose, Bld: 114 mg/dL — ABNORMAL HIGH (ref 70–99)
Potassium: 3.4 mmol/L — ABNORMAL LOW (ref 3.5–5.1)
Sodium: 132 mmol/L — ABNORMAL LOW (ref 135–145)

## 2018-09-06 MED ORDER — DIPHENOXYLATE-ATROPINE 2.5-0.025 MG PO TABS: 1.0000 | ORAL_TABLET | ORAL | 0 refills | Status: DC | PRN

## 2018-09-06 NOTE — Progress Notes (Signed)
Location:   [facility] Room Number: 154 D Place of Service:  SNF (31)   CODE STATUS: DNR  Allergies  Allergen Reactions  . Aleve [Naproxen Sodium] Hives and Palpitations  . Hydrocodone Nausea Only  . Statins Other (See Comments)    Muscle pain  . Celebrex [Celecoxib] Nausea And Vomiting  . Codeine Nausea And Vomiting  . Morphine And Related Nausea And Vomiting    Chief Complaint  Patient presents with  . Acute Visit    72 Hour Care Plan Meeting    HPI:  We have come together for his her care plan meeting. Her goal is to return back home; she has 2 steps. She lives alone; will need assistance to run her errands such as grocery shopping. She continues to have diarrhea is on lomotil every 6 hours as needed and states she needs this medication more frequently. She has chronic abdominal pain which is presently being managed.   Past Medical History:  Diagnosis Date  . Anxiety   . Arthritis   . Carotid artery disease ([place]) 2007   R CEA; dopplers 10/2016:  R ICA moderate (non-obstructive plaque) ; L ICA ~50-69%, but may underestimate. (reassess next with either  CTA versus formal cerebral angiogram)   . Constipation due to pain medication   . COPD (chronic obstructive pulmonary disease) ([place])   . Depression   . Diverticulosis   . Dyspnea   . Fibromyalgia   . Gastritis   . GERD (gastroesophageal reflux disease)   . Head injury   . History of pneumonia   . History of stroke 47/0962   complication of R SubClav A PTA -- R hemispheric CVA  . Hypertension   . Hypothyroidism   . Osteoarthritis   . Peripheral vascular disease ([place])    Bilateral subclavian artery disease (status post R SubClav A PTA).  R side CEA (with US findings of Mod-Severe L CIA stenosis).  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   . Pneumonia   . Stroke ([place])   . Urgency incontinence     Past Surgical History:  Procedure Laterality Date  . ANTERIOR AND  POSTERIOR REPAIR  12/2000   Archie Endo 07/04/2010  . ANTERIOR CERVICAL DECOMP/DISCECTOMY FUSION N/A 04/03/2012   Procedure: ANTERIOR CERVICAL DECOMPRESSION/DISCECTOMY FUSION 2 LEVELS;  Surgeon: Hosie Spangle, MD;  Location: [facility];  Service: Neurosurgery;  Laterality: N/A;  Cervical four-five,Cervical five-six anterior cervical decompression with fusion plating and bonegraft  . BACK SURGERY    . BREAST SURGERY Right 02/2007   Archie Endo 06/23/2010  . BUNIONECTOMY WITH HAMMERTOE RECONSTRUCTION Left 09/10/2007   Archie Endo 06/23/2010  . CAROTID ENDARTERECTOMY Right 2007   [facility], Dr. Lucky Cowboy  . CATARACT EXTRACTION W/ INTRAOCULAR LENS IMPLANT Left    Archie Endo 06/23/2010  . CHOLECYSTECTOMY    . COLONOSCOPY N/A 07/15/2014   Procedure: COLONOSCOPY;  Surgeon: Rogene Houston, MD;  [facility];  Service: Endoscopy;  Laterality: N/A;  200 - moved to 2:10 - Ann to notify pt  . COMPRESSION HIP SCREW Right 08/01/2015   Procedure: COMPRESSION HIP;  Surgeon: Carole Civil, MD;  Location: [facility];  Service: Orthopedics;  Laterality: Right;  . ESOPHAGOGASTRODUODENOSCOPY N/A 07/15/2014   Procedure: ESOPHAGOGASTRODUODENOSCOPY (EGD);  Surgeon: Rogene Houston, MD;  [facility];  Service: Endoscopy;  Laterality: N/A;  . EYE SURGERY     cataract /lens both eyes  . FRACTURE SURGERY    . INCONTINENCE  SURGERY  12/2000   Tension-free transvaginal tape procedure/notes 07/04/2010  . IR ANGIOGRAM SELECTIVE EACH ADDITIONAL VESSEL  01/21/2018  . IR ANGIOGRAM VISCERAL SELECTIVE  01/21/2018  . IR PTA NON CORO-LOWER EXTREM  01/21/2018  . IR RADIOLOGIST EVAL & MGMT  12/11/2017  . IR TRANSCATH PLC STENT 1ST ART NOT LE CV CAR VERT CAR  01/21/2018  . IR US GUIDE VASC ACCESS RIGHT  01/21/2018  . JOINT REPLACEMENT    . LUMBAR FUSION  08/2006   Archie Endo 06/23/2010  . PERIPHERAL VASCULAR BALLOON ANGIOPLASTY  02/2013   Aortic arch angiography revealed 70% innominate/R subclavian stenosis along with 60% left common carotid  ostial stenosis and moderate left subclavian artery stenosis. ->  Innominate artery PTA with 7 mm balloon reducing stenosis to roughly 30%.  Marland Kitchen PERIPHERAL VASCULAR CATHETERIZATION N/A 10/08/2014   Procedure: Visceral Angiography;  Surgeon: Algernon Huxley, MD;  Location: [facility] CV [facility];  Service: Cardiovascular;  - long stenosis/occlusion of SMA. 75% celiac artery (PTA with 6 mm balloon)  . PERIPHERAL VASCULAR CATHETERIZATION N/A 10/08/2014   Procedure: Visceral Artery Intervention;  Surgeon: Algernon Huxley, MD;  Location: [facility];  Service: Cardiovascular;: 6 mm PTA balloon to stenosed CELIAC ARTERY  . ROTATOR CUFF REPAIR Left X 3  . TOTAL KNEE ARTHROPLASTY Bilateral 2005; 2011   right; left  . TOTAL SHOULDER ARTHROPLASTY Left 11/15/2016  . TOTAL SHOULDER ARTHROPLASTY Left 11/15/2016   Procedure: LEFT TOTAL SHOULDER ARTHROPLASTY;  Surgeon: [name], MD;  Location: [facility];  Service: Orthopedics;  Laterality: Left;  . TRANSTHORACIC ECHOCARDIOGRAM  10/2016   Normal LV wall thickness with LVEF 60-65% and grade 1 diastolic dysfunction.  Aortic valve calcific sclerosis with no stenosis.  Mildly calcified mitral annulus and leaflets.   Marland Kitchen VAGINAL HYSTERECTOMY  12/2000   Archie Endo 07/04/2010    Social History   Socioeconomic History  . Marital status: Widowed    Spouse name: Not on file  . Number of children: 3  . Years of education: 58  . Highest education level: Not on file  Occupational History  . 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  Social Needs  . Financial resource strain: Not hard at all  . Food insecurity    Worry: Never true    Inability: Never true  . Transportation needs    Medical: No    Non-medical: No  Tobacco Use  . Smoking status: Former Smoker    Packs/day: 0.12    Years: 63.00    Pack years: 7.56    Types: Cigarettes  . Smokeless tobacco: Never Used  Substance and Sexual Activity  . Alcohol use: No     Alcohol/week: 0.0 standard drinks  . Drug use: No  . Sexual activity: Never    Birth control/protection: Abstinence  Lifestyle  . Physical activity    Days per week: 3 days    Minutes per session: Not on file  . Stress: To some extent  Relationships  . Social connections    Talks on phone: More than three times a week    Gets together: Not on file    Attends religious service: Not on file    Active member of club or organization: Not on file    Attends meetings of clubs or organizations: Not on file    Relationship status: Not on file  . Intimate partner violence    Fear of current or  ex partner: No    Emotionally abused: No    Physically abused: No    Forced sexual activity: No  Other Topics Concern  . Not on file  Social History Narrative    She is widowed. She is the mother 36 grandchildren. She lives alone. She works as a Electrical engineer for [employer]   . She exercises at the [facility] pole 45 at 60 minutes of time 3 days a week.   Family History  Problem Relation Age of Onset  . Pancreatic cancer Mother   . Hypertension Mother   . Prostate cancer Father       VITAL SIGNS BP (!) 168/94   Pulse 64   Temp 97.8 F (36.6 C)   Resp 20   Ht 5' 1.5" (1.562 m)   Wt 108 lb 3.2 oz (49.1 kg)   BMI 20.11 kg/m   Outpatient Encounter Medications as of 09/06/2018  Medication Sig  . Ascorbic Acid (VITAMIN C) 1000 MG tablet Take 1,000 mg by mouth 2 (two) times daily.   Marland Kitchen aspirin EC 81 MG tablet Take 81 mg by mouth daily.  . Calcium Carb-Cholecalciferol (CALCIUM 600 + D PO) Take 1 tablet by mouth 3 (three) times daily.   Marland Kitchen diltiazem (DILACOR XR) 240 MG 24 hr capsule Take 240 mg by mouth daily.  . diphenhydrAMINE (BENADRYL) 25 mg capsule Take 25 mg by mouth at bedtime.  . diphenoxylate-atropine (LOMOTIL) 2.5-0.025 MG tablet Take 1 tablet by mouth 4 (four) times daily as needed for diarrhea or loose stools.  . DULoxetine (CYMBALTA) 30 MG capsule Take 30 mg by mouth daily.    . Flaxseed, Linseed, (FLAXSEED OIL) 1000 MG CAPS Take 1 capsule by mouth 2 (two) times daily.  Marland Kitchen levalbuterol (XOPENEX) 0.63 MG/3ML nebulizer solution Take 0.63 mg by nebulization every 4 (four) hours.  . metroNIDAZOLE (FLAGYL) 250 MG tablet Take 250 mg by mouth 3 (three) times daily. X 5 more days  . Multiple Vitamin (MULTIVITAMIN WITH MINERALS) TABS Take 1 tablet by mouth daily.  . niacin 500 MG CR capsule Take 500 mg by mouth at bedtime.  . NON FORMULARY Diet type:  Regular  . Omega-3 1000 MG CAPS Take 1 capsule by mouth daily.  . pantoprazole (PROTONIX) 40 MG tablet Take 1 tablet (40 mg total) by mouth daily.  . traMADol (ULTRAM) 50 MG tablet Take 1 tablet (50 mg total) by mouth every 6 (six) hours as needed for moderate pain.  . [DISCONTINUED] OXYGEN Inhale into the lungs.   No facility-administered encounter medications on file as of 09/06/2018.      SIGNIFICANT DIAGNOSTIC EXAMS  PREVIOUS:   08-26-18: CT angio of abdomen and pelvis: VASCULAR 1. Stable chronic occlusion of the SMA. There is distal patency of the SMA. IMA appears diminutive but is grossly patent. Mesenteric vessels appear diffusely attenuated. 2. Extensive aortic atherosclerosis of the abdominal aorta and branch vessels without high-grade stenosis or acute occlusive disease. NON-VASCULAR 1. Negative for bowel wall thickening or acute colitis. Diffuse fluid within the small and large bowel, possibly due to enteritis 2. Healing right inferior pubic ramus fracture 3. Sigmoid colon diverticular disease without acute inflammatory process   09-02-18: acute abdomen and chest x-ray: Negative abdominal radiographs. No acute cardiopulmonary disease.   NO NEW LABS.   LABS REVIEWED PREVIOUS:   08-29-18: vit D 26.0 09-02-18: wbc 7.8; hgb 12.6; hct 35.7; mcv 87.9; plt 411; glucose 91; bun 13; creat 0.55; k+ 3.0; na++ 129; ca 6.4;  liver normal albumin 3.2; urine culture: klebsiella pneumoniae   TODAY:   09-06-18: glucose  114; bun 18; creat 0.47; k+ 3.4; an++ 132; ca 7.0    Review of Systems  Constitutional: Negative for malaise/fatigue.  Respiratory: Negative for cough and shortness of breath.   Cardiovascular: Negative for chest pain, palpitations and leg swelling.  Gastrointestinal: Positive for abdominal pain and diarrhea. Negative for heartburn.  Musculoskeletal: Negative for back pain, joint pain and myalgias.  Skin: Negative.   Neurological: Negative for dizziness.  Psychiatric/Behavioral: The patient is not nervous/anxious.     Physical Exam Constitutional:      General: She is not in acute distress.    Appearance: She is well-developed. She is not diaphoretic.     Comments: Frail   Eyes:     Comments: Bilateral cataract removal with lens implants    Neck:     Musculoskeletal: Neck supple.     Thyroid: No thyromegaly.     Comments: Right carotid endarterectomy  Cardiovascular:     Rate and Rhythm: Normal rate and regular rhythm.     Pulses: Normal pulses.     Heart sounds: Normal heart sounds.     Comments: Pedal pulses faint Pulmonary:     Effort: Pulmonary effort is normal. No respiratory distress.     Breath sounds: Wheezing present.     Comments: Few scattered  Abdominal:     General: Bowel sounds are normal. There is no distension.     Palpations: Abdomen is soft.     Tenderness: There is abdominal tenderness.     Comments: Has mild generalized tenderness present   Musculoskeletal:     Right lower leg: No edema.     Left lower leg: No edema.     Comments: May 2020: pubic rami fracture  2014: cervical fusion 2017: right hip compression screw  Lumbar fusion Left shoulder replacement    Lymphadenopathy:     Cervical: No cervical adenopathy.  Skin:    General: Skin is warm and dry.     Comments: Lower extremities discolored   Neurological:     Mental Status: She is alert and oriented to person, place, and time.  Psychiatric:        Mood and Affect: Mood normal.      ASSESSMENT/ PLAN:  TODAY;   1. Hypokalemia 2. Hyponatremia 3. Functional diarrhea 4. Chronic obstructive pulmonary disease unspecified copd type  Will begin k+ 20 meq daily Will increase calcium to 4 times daily  Will change lomotil to every 4 hours as needed Her goal is to return back home  She has been instructed not to drive until her PCP gives her permission. Verbalized understanding Will continue therapy as directed.  Will check bmp on 09-10-18   MD is aware of resident's narcotic use and is in agreement with current plan of care. We will attempt to wean resident as apropriate   Ok Edwards NP [facility]  Contact [phone] Monday through Friday 8am- 5pm  After hours call [phone]

## 2018-09-09 ENCOUNTER — Other Ambulatory Visit: Payer: Self-pay | Admitting: Adult Health

## 2018-09-09 ENCOUNTER — Encounter: Payer: Self-pay | Admitting: Adult Health

## 2018-09-09 ENCOUNTER — Non-Acute Institutional Stay (SKILLED_NURSING_FACILITY): Payer: PPO | Admitting: Adult Health

## 2018-09-09 DIAGNOSIS — G8929 Other chronic pain: Secondary | ICD-10-CM

## 2018-09-09 DIAGNOSIS — R1084 Generalized abdominal pain: Secondary | ICD-10-CM

## 2018-09-09 DIAGNOSIS — S32591G Other specified fracture of right pubis, subsequent encounter for fracture with delayed healing: Secondary | ICD-10-CM

## 2018-09-09 MED ORDER — DIPHENOXYLATE-ATROPINE 2.5-0.025 MG PO TABS: 1.0000 | ORAL_TABLET | ORAL | 0 refills | Status: DC | PRN

## 2018-09-09 NOTE — Progress Notes (Signed)
Location:   [place] Room Number: 154 D Place of Service:  SNF (31)   CODE STATUS: DNR  Allergies  Allergen Reactions  . Aleve [Naproxen Sodium] Hives and Palpitations  . Hydrocodone Nausea Only  . Statins Other (See Comments)    Muscle pain  . Celebrex [Celecoxib] Nausea And Vomiting  . Codeine Nausea And Vomiting  . Morphine And Related Nausea And Vomiting    Chief Complaint  Patient presents with  . Acute Visit    Pain Management    HPI:  She is currently taking tramadol 50 mg every 6 hours as needed for chronic generalized pain. She has not taken this medication in several days. She denies any uncontrolled pain; denies any uncontrolled diarrhea; denies any changes in her appetite.   Past Medical History:  Diagnosis Date  . Anxiety   . Arthritis   . Carotid artery disease ([place]) 2007   R CEA; dopplers 10/2016:  R ICA moderate (non-obstructive plaque) ; L ICA ~50-69%, but may underestimate. (reassess next with either  CTA versus formal cerebral angiogram)   . Constipation due to pain medication   . COPD (chronic obstructive pulmonary disease) ([place])   . Depression   . Diverticulosis   . Dyspnea   . Fibromyalgia   . Gastritis   . GERD (gastroesophageal reflux disease)   . Head injury   . History of pneumonia   . History of stroke [id number]/7322   complication of R SubClav A PTA -- R hemispheric CVA  . Hypertension   . Hypothyroidism   . Osteoarthritis   . Peripheral vascular disease ([place])    Bilateral subclavian artery disease (status post R SubClav A PTA).  R side CEA (with US findings of Mod-Severe L CIA stenosis).  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   . Pneumonia   . Stroke ([place])   . Urgency incontinence     Past Surgical History:  Procedure Laterality Date  . ANTERIOR AND POSTERIOR REPAIR  12/2000   Archie Endo 07/04/2010  . ANTERIOR CERVICAL DECOMP/DISCECTOMY FUSION N/A 04/03/2012   Procedure: ANTERIOR CERVICAL  DECOMPRESSION/DISCECTOMY FUSION 2 LEVELS;  Surgeon: Hosie Spangle, MD;  Location: [place];  Service: Neurosurgery;  Laterality: N/A;  Cervical four-five,Cervical five-six anterior cervical decompression with fusion plating and bonegraft  . BACK SURGERY    . BREAST SURGERY Right 02/2007   Archie Endo 06/23/2010  . BUNIONECTOMY WITH HAMMERTOE RECONSTRUCTION Left 09/10/2007   Archie Endo 06/23/2010  . CAROTID ENDARTERECTOMY Right 2007   [facility], Dr. Lucky Cowboy  . CATARACT EXTRACTION W/ INTRAOCULAR LENS IMPLANT Left    Archie Endo 06/23/2010  . CHOLECYSTECTOMY    . COLONOSCOPY N/A 07/15/2014   Procedure: COLONOSCOPY;  Surgeon: Rogene Houston, MD;  [facility];  Service: Endoscopy;  Laterality: N/A;  200 - moved to 2:10 - Ann to notify pt  . COMPRESSION HIP SCREW Right 08/01/2015   Procedure: COMPRESSION HIP;  Surgeon: Carole Civil, MD;  Location: [facility];  Service: Orthopedics;  Laterality: Right;  . ESOPHAGOGASTRODUODENOSCOPY N/A 07/15/2014   Procedure: ESOPHAGOGASTRODUODENOSCOPY (EGD);  Surgeon: Rogene Houston, MD;  [facility];  Service: Endoscopy;  Laterality: N/A;  . EYE SURGERY     cataract /lens both eyes  . FRACTURE SURGERY    . INCONTINENCE SURGERY  12/2000   Tension-free transvaginal tape procedure/notes 07/04/2010  . IR ANGIOGRAM SELECTIVE EACH ADDITIONAL VESSEL  01/21/2018  . IR ANGIOGRAM VISCERAL SELECTIVE  01/21/2018  .  IR PTA NON CORO-LOWER EXTREM  01/21/2018  . IR RADIOLOGIST EVAL & MGMT  12/11/2017  . IR TRANSCATH PLC STENT 1ST ART NOT LE CV CAR VERT CAR  01/21/2018  . IR US GUIDE VASC ACCESS RIGHT  01/21/2018  . JOINT REPLACEMENT    . LUMBAR FUSION  08/2006   Archie Endo 06/23/2010  . PERIPHERAL VASCULAR BALLOON ANGIOPLASTY  02/2013   Aortic arch angiography revealed 70% innominate/R subclavian stenosis along with 60% left common carotid ostial stenosis and moderate left subclavian artery stenosis. ->  Innominate artery PTA with 7 mm balloon reducing stenosis to roughly 30%.   Marland Kitchen PERIPHERAL VASCULAR CATHETERIZATION N/A 10/08/2014   Procedure: Visceral Angiography;  Surgeon: Algernon Huxley, MD;  Location: [facility];  Service: Cardiovascular;  - long stenosis/occlusion of SMA. 75% celiac artery (PTA with 6 mm balloon)  . PERIPHERAL VASCULAR CATHETERIZATION N/A 10/08/2014   Procedure: Visceral Artery Intervention;  Surgeon: Algernon Huxley, MD;  Location: [place] [facility];  Service: Cardiovascular;: 6 mm PTA balloon to stenosed CELIAC ARTERY  . ROTATOR CUFF REPAIR Left X 3  . TOTAL KNEE ARTHROPLASTY Bilateral 2005; 2011   right; left  . TOTAL SHOULDER ARTHROPLASTY Left 11/15/2016  . TOTAL SHOULDER ARTHROPLASTY Left 11/15/2016   Procedure: LEFT TOTAL SHOULDER ARTHROPLASTY;  Surgeon: [name], MD;  Location: [place];  Service: Orthopedics;  Laterality: Left;  . TRANSTHORACIC ECHOCARDIOGRAM  10/2016   Normal LV wall thickness with LVEF 60-65% and grade 1 diastolic dysfunction.  Aortic valve calcific sclerosis with no stenosis.  Mildly calcified mitral annulus and leaflets.   Marland Kitchen VAGINAL HYSTERECTOMY  12/2000   Archie Endo 07/04/2010    Social History   Socioeconomic History  . Marital status: Widowed    Spouse name: Not on file  . Number of children: 3  . Years of education: 31  . Highest education level: Not on file  Occupational History  . 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  Social Needs  . Financial resource strain: Not hard at all  . Food insecurity    Worry: Never true    Inability: Never true  . Transportation needs    Medical: No    Non-medical: No  Tobacco Use  . Smoking status: Former Smoker    Packs/day: 0.12    Years: 63.00    Pack years: 7.56    Types: Cigarettes  . Smokeless tobacco: Never Used  Substance and Sexual Activity  . Alcohol use: No    Alcohol/week: 0.0 standard drinks  . Drug use: No  . Sexual activity: Never    Birth control/protection: Abstinence  Lifestyle  .  Physical activity    Days per week: 3 days    Minutes per session: Not on file  . Stress: To some extent  Relationships  . Social connections    Talks on phone: More than three times a week    Gets together: Not on file    Attends religious service: Not on file    Active member of club or organization: Not on file    Attends meetings of clubs or organizations: Not on file    Relationship status: Not on file  . Intimate partner violence    Fear of current or ex partner: No    Emotionally abused: No    Physically abused: No    Forced sexual activity: No  Other Topics Concern  . Not on  file  Social History Narrative    She is widowed. She is the mother 36 grandchildren. She lives alone. She works as a Electrical engineer for [employer]   . She exercises at the [facility] pole 45 at 60 minutes of time 3 days a week.   Family History  Problem Relation Age of Onset  . Pancreatic cancer Mother   . Hypertension Mother   . Prostate cancer Father       VITAL SIGNS BP (!) 158/66   Pulse 84   Temp 97.8 F (36.6 C)   Resp 20   Ht 5' 1.5" (1.562 m)   Wt 107 lb 14.4 oz (48.9 kg)   BMI 20.06 kg/m   Outpatient Encounter Medications as of 09/09/2018  Medication Sig  . Ascorbic Acid (VITAMIN C) 1000 MG tablet Take 1,000 mg by mouth 2 (two) times daily.   Marland Kitchen aspirin EC 81 MG tablet Take 81 mg by mouth daily.  . Calcium Carb-Cholecalciferol (CALCIUM 600 + D PO) Take 1 tablet by mouth 3 (three) times daily.   Marland Kitchen diltiazem (DILACOR XR) 240 MG 24 hr capsule Take 240 mg by mouth daily.  . diphenhydrAMINE (BENADRYL) 25 mg capsule Take 25 mg by mouth at bedtime.  . diphenoxylate-atropine (LOMOTIL) 2.5-0.025 MG tablet Take 1 tablet by mouth every 4 (four) hours as needed for diarrhea or loose stools.  . DULoxetine (CYMBALTA) 30 MG capsule Take 30 mg by mouth daily.   . Flaxseed, Linseed, (FLAXSEED OIL) 1000 MG CAPS Take 1 capsule by mouth 2 (two) times daily.  Marland Kitchen levalbuterol (XOPENEX) 0.63 MG/3ML  nebulizer solution Take 0.63 mg by nebulization every 4 (four) hours.  . Multiple Vitamin (MULTIVITAMIN WITH MINERALS) TABS Take 1 tablet by mouth daily.  . niacin 500 MG CR capsule Take 500 mg by mouth at bedtime.  . NON FORMULARY Diet type:  Regular  . Omega-3 1000 MG CAPS Take 1 capsule by mouth daily.  . pantoprazole (PROTONIX) 40 MG tablet Take 1 tablet (40 mg total) by mouth daily.  . potassium chloride SA (K-DUR) 20 MEQ tablet Take 20 mEq by mouth daily.  . traMADol (ULTRAM) 50 MG tablet Take 1 tablet (50 mg total) by mouth every 6 (six) hours as needed for moderate pain.   No facility-administered encounter medications on file as of 09/09/2018.      SIGNIFICANT DIAGNOSTIC EXAMS  PREVIOUS:   08-26-18: CT angio of abdomen and pelvis: VASCULAR 1. Stable chronic occlusion of the SMA. There is distal patency of the SMA. IMA appears diminutive but is grossly patent. Mesenteric vessels appear diffusely attenuated. 2. Extensive aortic atherosclerosis of the abdominal aorta and branch vessels without high-grade stenosis or acute occlusive disease. NON-VASCULAR 1. Negative for bowel wall thickening or acute colitis. Diffuse fluid within the small and large bowel, possibly due to enteritis 2. Healing right inferior pubic ramus fracture 3. Sigmoid colon diverticular disease without acute inflammatory process   09-02-18: acute abdomen and chest x-ray: Negative abdominal radiographs. No acute cardiopulmonary disease.   NO NEW LABS.   LABS REVIEWED PREVIOUS:   08-29-18: vit D 26.0 09-02-18: wbc 7.8; hgb 12.6; hct 35.7; mcv 87.9; plt 411; glucose 91; bun 13; creat 0.55; k+ 3.0; na++ 129; ca 6.4; liver normal albumin 3.2; urine culture: klebsiella pneumoniae  09-06-18: glucose 114; bun 18; creat 0.47; k+ 3.4; na++ 132; ca 7.0  NO NEW LABS.     Review of Systems  Constitutional: Negative for malaise/fatigue.  Respiratory: Negative for cough  and shortness of breath.   Cardiovascular:  Negative for chest pain, palpitations and leg swelling.  Gastrointestinal: Positive for abdominal pain and diarrhea. Negative for heartburn.       Both are chronic and are current managed   Musculoskeletal: Negative for back pain, joint pain and myalgias.  Skin: Negative.   Neurological: Negative for dizziness.  Psychiatric/Behavioral: The patient is not nervous/anxious.     Physical Exam Constitutional:      General: She is not in acute distress.    Appearance: She is not diaphoretic.     Comments: Frail   Eyes:     Comments: Bilateral cataract removal with lens implants     Neck:     Musculoskeletal: Neck supple.     Thyroid: No thyromegaly.     Comments: Right carotid endarterectomy  Cardiovascular:     Rate and Rhythm: Normal rate and regular rhythm.     Heart sounds: Normal heart sounds.  Pulmonary:     Effort: Pulmonary effort is normal. No respiratory distress.     Breath sounds: Wheezing present.     Comments: Few and scattered  Abdominal:     General: Bowel sounds are normal. There is no distension.     Palpations: Abdomen is soft.     Tenderness: There is abdominal tenderness.     Comments: Has mild generalized tenderness present    Musculoskeletal:     Right lower leg: No edema.     Left lower leg: No edema.     Comments: May 2020: pubic rami fracture  2014: cervical fusion 2017: right hip compression screw  Lumbar fusion Left shoulder replacement    Is able to move all extremities   Lymphadenopathy:     Cervical: No cervical adenopathy.  Skin:    General: Skin is warm and dry.     Comments: Bilateral lower extremities discolored   Neurological:     Mental Status: She is alert and oriented to person, place, and time.  Psychiatric:        Mood and Affect: Mood normal.      ASSESSMENT/ PLAN:  TODAY;   1. Chronic generalized abdominal  pain 2. Closed fracture of multiple pubic rami, right, with delayed healing subsequent encounter  Her pain is  managed will continue cymbalta 30 mg daily and will stop ultram for non-use will monitor     MD is aware of resident's narcotic use and is in agreement with current plan of care. We will attempt to wean resident as apropriate   Ok Edwards NP [facility]  Contact [phone] Monday through Friday 8am- 5pm  After hours call [phone]

## 2018-09-10 ENCOUNTER — Encounter (HOSPITAL_COMMUNITY)
Admission: RE | Admit: 2018-09-10 | Discharge: 2018-09-10 | Disposition: A | Discharge status: Home / Self Care | Payer: PPO | Source: Skilled Nursing Facility | Attending: *Deleted | Admitting: *Deleted

## 2018-09-10 ENCOUNTER — Non-Acute Institutional Stay (SKILLED_NURSING_FACILITY): Payer: PPO | Admitting: Adult Health

## 2018-09-10 ENCOUNTER — Other Ambulatory Visit: Payer: Self-pay | Admitting: Adult Health

## 2018-09-10 ENCOUNTER — Encounter: Payer: Self-pay | Admitting: Adult Health

## 2018-09-10 ENCOUNTER — Encounter (HOSPITAL_COMMUNITY)
Admission: RE | Admit: 2018-09-10 | Discharge: 2018-09-10 | Disposition: A | Discharge status: Home / Self Care | Payer: PPO | Source: Ambulatory Visit | Attending: Pulmonary Disease | Admitting: Pulmonary Disease

## 2018-09-10 DIAGNOSIS — J449 Chronic obstructive pulmonary disease, unspecified: Secondary | ICD-10-CM | POA: Diagnosis not present

## 2018-09-10 DIAGNOSIS — K219 Gastro-esophageal reflux disease without esophagitis: Secondary | ICD-10-CM

## 2018-09-10 DIAGNOSIS — I10 Essential (primary) hypertension: Secondary | ICD-10-CM | POA: Diagnosis not present

## 2018-09-10 DIAGNOSIS — I639 Cerebral infarction, unspecified: Secondary | ICD-10-CM

## 2018-09-10 LAB — BASIC METABOLIC PANEL
Anion gap: 8 (ref 5–15)
BUN: 13 mg/dL (ref 8–23)
CO2: 26 mmol/L (ref 22–32)
Calcium: 7.5 mg/dL — ABNORMAL LOW (ref 8.9–10.3)
Chloride: 98 mmol/L (ref 98–111)
Creatinine, Ser: 0.49 mg/dL (ref 0.44–1.00)
GFR calc Af Amer: >60 mL/min (ref >60)
GFR calc non Af Amer: >60 mL/min (ref >60)
Glucose, Bld: 94 mg/dL (ref 70–99)
Potassium: 3.6 mmol/L (ref 3.5–5.1)
Sodium: 132 mmol/L — ABNORMAL LOW (ref 135–145)

## 2018-09-10 MED ORDER — POTASSIUM CHLORIDE CRYS ER 20 MEQ PO TBCR: 20.0000 meq | EXTENDED_RELEASE_TABLET | Freq: Every day | ORAL | 0 refills | Status: AC

## 2018-09-10 MED ORDER — LEVALBUTEROL HCL 0.63 MG/3ML IN NEBU: 0.6300 mg | INHALATION_SOLUTION | RESPIRATORY_TRACT | 0 refills | Status: AC

## 2018-09-10 MED ORDER — DIPHENOXYLATE-ATROPINE 2.5-0.025 MG PO TABS: 1.0000 | ORAL_TABLET | ORAL | 0 refills | Status: AC | PRN

## 2018-09-10 MED ORDER — DILTIAZEM HCL ER 240 MG PO CP24: 240.0000 mg | ORAL_CAPSULE | Freq: Every day | ORAL | 0 refills | Status: AC

## 2018-09-10 MED ORDER — DULOXETINE HCL 30 MG PO CPEP: 30.0000 mg | ORAL_CAPSULE | Freq: Every day | ORAL | 0 refills | Status: AC

## 2018-09-10 MED ORDER — PANTOPRAZOLE SODIUM 40 MG PO TBEC: 40.0000 mg | DELAYED_RELEASE_TABLET | Freq: Every day | ORAL | 0 refills | Status: AC

## 2018-09-10 NOTE — Progress Notes (Signed)
Location:   [facility] Room Number: 154 D Place of Service:  SNF (31)    CODE STATUS: DNR  Allergies  Allergen Reactions  . Aleve [Naproxen Sodium] Hives and Palpitations  . Hydrocodone Nausea Only  . Statins Other (See Comments)    Muscle pain  . Celebrex [Celecoxib] Nausea And Vomiting  . Codeine Nausea And Vomiting  . Morphine And Related Nausea And Vomiting    Chief Complaint  Patient presents with  . Discharge Note    Discharging to home on 09/11/2018    HPI:  She is being discharged to home with home health for pt/ot/rn/cna/sw. She will not need any dme. She will need to follow up with her medical provider and will need her prescriptions written. She had been hospitalized  She was admitted here for short term rehab due to weakness. She has done well and is ready to complete her therapy on a home health basis.    Past Medical History:  Diagnosis Date  . Anxiety   . Arthritis   . Carotid artery disease ([place]) 2007   R CEA; dopplers 10/2016:  R ICA moderate (non-obstructive plaque) ; L ICA ~50-69%, but may underestimate. (reassess next with either  CTA versus formal cerebral angiogram)   . Constipation due to pain medication   . COPD (chronic obstructive pulmonary disease) ([place])   . Depression   . Diverticulosis   . Dyspnea   . Fibromyalgia   . Gastritis   . GERD (gastroesophageal reflux disease)   . Head injury   . History of pneumonia   . History of stroke 54/0086   complication of R SubClav A PTA -- R hemispheric CVA  . Hypertension   . Hypothyroidism   . Osteoarthritis   . Peripheral vascular disease ([place])    Bilateral subclavian artery disease (status post R SubClav A PTA).  R side CEA (with US findings of Mod-Severe L CIA stenosis).  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   . Pneumonia   . Stroke ([place])   . Urgency incontinence     Past Surgical History:  Procedure Laterality Date  . ANTERIOR AND  POSTERIOR REPAIR  12/2000   Archie Endo 07/04/2010  . ANTERIOR CERVICAL DECOMP/DISCECTOMY FUSION N/A 04/03/2012   Procedure: ANTERIOR CERVICAL DECOMPRESSION/DISCECTOMY FUSION 2 LEVELS;  Surgeon: Hosie Spangle, MD;  Location: [facility];  Service: Neurosurgery;  Laterality: N/A;  Cervical four-five,Cervical five-six anterior cervical decompression with fusion plating and bonegraft  . BACK SURGERY    . BREAST SURGERY Right 02/2007   Archie Endo 06/23/2010  . BUNIONECTOMY WITH HAMMERTOE RECONSTRUCTION Left 09/10/2007   Archie Endo 06/23/2010  . CAROTID ENDARTERECTOMY Right 2007   [facility], Dr. Lucky Cowboy  . CATARACT EXTRACTION W/ INTRAOCULAR LENS IMPLANT Left    Archie Endo 06/23/2010  . CHOLECYSTECTOMY    . COLONOSCOPY N/A 07/15/2014   Procedure: COLONOSCOPY;  Surgeon: Rogene Houston, MD;  [facility];  Service: Endoscopy;  Laterality: N/A;  200 - moved to 2:10 - Ann to notify pt  . COMPRESSION HIP SCREW Right 08/01/2015   Procedure: COMPRESSION HIP;  Surgeon: Carole Civil, MD;  Location: [facility] OR[facility];  Service: Orthopedics;  Laterality: Right;  . ESOPHAGOGASTRODUODENOSCOPY N/A 07/15/2014   Procedure: ESOPHAGOGASTRODUODENOSCOPY (EGD);  Surgeon: Rogene Houston, MD;  [facility];  Service: Endoscopy;  Laterality: N/A;  . EYE SURGERY     cataract /lens both eyes  . FRACTURE SURGERY    .  INCONTINENCE SURGERY  12/2000   Tension-free transvaginal tape procedure/notes 07/04/2010  . IR ANGIOGRAM SELECTIVE EACH ADDITIONAL VESSEL  01/21/2018  . IR ANGIOGRAM VISCERAL SELECTIVE  01/21/2018  . IR PTA NON CORO-LOWER EXTREM  01/21/2018  . IR RADIOLOGIST EVAL & MGMT  12/11/2017  . IR TRANSCATH PLC STENT 1ST ART NOT LE CV CAR VERT CAR  01/21/2018  . IR US GUIDE VASC ACCESS RIGHT  01/21/2018  . JOINT REPLACEMENT    . LUMBAR FUSION  08/2006   Archie Endo 06/23/2010  . PERIPHERAL VASCULAR BALLOON ANGIOPLASTY  02/2013   Aortic arch angiography revealed 70% innominate/R subclavian stenosis along with 60% left common carotid  ostial stenosis and moderate left subclavian artery stenosis. ->  Innominate artery PTA with 7 mm balloon reducing stenosis to roughly 30%.  Marland Kitchen PERIPHERAL VASCULAR CATHETERIZATION N/A 10/08/2014   Procedure: Visceral Angiography;  Surgeon: Algernon Huxley, MD;  Location: [facility];  Service: Cardiovascular;  - long stenosis/occlusion of SMA. 75% celiac artery (PTA with 6 mm balloon)  . PERIPHERAL VASCULAR CATHETERIZATION N/A 10/08/2014   Procedure: Visceral Artery Intervention;  Surgeon: Algernon Huxley, MD;  Location: [facility];  Service: Cardiovascular;: 6 mm PTA balloon to stenosed CELIAC ARTERY  . ROTATOR CUFF REPAIR Left X 3  . TOTAL KNEE ARTHROPLASTY Bilateral 2005; 2011   right; left  . TOTAL SHOULDER ARTHROPLASTY Left 11/15/2016  . TOTAL SHOULDER ARTHROPLASTY Left 11/15/2016   Procedure: LEFT TOTAL SHOULDER ARTHROPLASTY;  Surgeon: [name], MD;  Location: [facility];  Service: Orthopedics;  Laterality: Left;  . TRANSTHORACIC ECHOCARDIOGRAM  10/2016   Normal LV wall thickness with LVEF 60-65% and grade 1 diastolic dysfunction.  Aortic valve calcific sclerosis with no stenosis.  Mildly calcified mitral annulus and leaflets.   Marland Kitchen VAGINAL HYSTERECTOMY  12/2000   Archie Endo 07/04/2010    Social History   Socioeconomic History  . Marital status: Widowed    Spouse name: Not on file  . Number of children: 3  . Years of education: 90  . Highest education level: Not on file  Occupational History  . 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  Social Needs  . Financial resource strain: Not hard at all  . Food insecurity    Worry: Never true    Inability: Never true  . Transportation needs    Medical: No    Non-medical: No  Tobacco Use  . Smoking status: Former Smoker    Packs/day: 0.12    Years: 63.00    Pack years: 7.56    Types: Cigarettes  . Smokeless tobacco: Never Used  Substance and Sexual Activity  . Alcohol use: No     Alcohol/week: 0.0 standard drinks  . Drug use: No  . Sexual activity: Never    Birth control/protection: Abstinence  Lifestyle  . Physical activity    Days per week: 3 days    Minutes per session: Not on file  . Stress: To some extent  Relationships  . Social connections    Talks on phone: More than three times a week    Gets together: Not on file    Attends religious service: Not on file    Active member of club or organization: Not on file    Attends meetings of clubs or organizations: Not on file    Relationship status: Not on file  . Intimate partner violence    Fear of current  or ex partner: No    Emotionally abused: No    Physically abused: No    Forced sexual activity: No  Other Topics Concern  . Not on file  Social History Narrative    She is widowed. She is the mother 77 grandchildren. She lives alone. She works as a Electrical engineer for [employer]   . She exercises at the [facility] pole 45 at 60 minutes of time 3 days a week.   Family History  Problem Relation Age of Onset  . Pancreatic cancer Mother   . Hypertension Mother   . Prostate cancer Father     VITAL SIGNS BP 120/76   Pulse 73   Temp 97.6 F (36.4 C)   Resp 19   Ht 5' 1.5" (1.562 m)   Wt 109 lb 12.8 oz (49.8 kg)   BMI 20.41 kg/m   Patient's Medications  New Prescriptions   No medications on file  Previous Medications   ASCORBIC ACID (VITAMIN C) 1000 MG TABLET    Take 1,000 mg by mouth 2 (two) times daily.    ASPIRIN EC 81 MG TABLET    Take 81 mg by mouth daily.   CALCIUM CARB-CHOLECALCIFEROL (CALCIUM 600 + D PO)    Take 1 tablet by mouth 3 (three) times daily.    DILTIAZEM (DILACOR XR) 240 MG 24 HR CAPSULE    Take 240 mg by mouth daily.   DIPHENHYDRAMINE (BENADRYL) 25 MG CAPSULE    Take 25 mg by mouth at bedtime.   DIPHENOXYLATE-ATROPINE (LOMOTIL) 2.5-0.025 MG TABLET    Take 1 tablet by mouth every 4 (four) hours as needed for diarrhea or loose stools.   DULOXETINE (CYMBALTA) 30 MG  CAPSULE    Take 30 mg by mouth daily.    FLAXSEED, LINSEED, (FLAXSEED OIL) 1000 MG CAPS    Take 1 capsule by mouth 2 (two) times daily.   LEVALBUTEROL (XOPENEX) 0.63 MG/3ML NEBULIZER SOLUTION    Take 0.63 mg by nebulization every 4 (four) hours.   MULTIPLE VITAMIN (MULTIVITAMIN WITH MINERALS) TABS    Take 1 tablet by mouth daily.   NIACIN 500 MG CR CAPSULE    Take 500 mg by mouth at bedtime.   NON FORMULARY    Diet type:  Regular   OMEGA-3 1000 MG CAPS    Take 1 capsule by mouth daily.   PANTOPRAZOLE (PROTONIX) 40 MG TABLET    Take 1 tablet (40 mg total) by mouth daily.   POTASSIUM CHLORIDE SA (K-DUR) 20 MEQ TABLET    Take 20 mEq by mouth daily.  Modified Medications   No medications on file  Discontinued Medications   TRAMADOL (ULTRAM) 50 MG TABLET    Take 1 tablet (50 mg total) by mouth every 6 (six) hours as needed for moderate pain.     SIGNIFICANT DIAGNOSTIC EXAMS PREVIOUS:   08-26-18: CT angio of abdomen and pelvis: VASCULAR 1. Stable chronic occlusion of the SMA. There is distal patency of the SMA. IMA appears diminutive but is grossly patent. Mesenteric vessels appear diffusely attenuated. 2. Extensive aortic atherosclerosis of the abdominal aorta and branch vessels without high-grade stenosis or acute occlusive disease. NON-VASCULAR 1. Negative for bowel wall thickening or acute colitis. Diffuse fluid within the small and large bowel, possibly due to enteritis 2. Healing right inferior pubic ramus fracture 3. Sigmoid colon diverticular disease without acute inflammatory process   09-02-18: acute abdomen and chest x-ray: Negative abdominal radiographs. No acute cardiopulmonary disease.  NO NEW LABS.   LABS REVIEWED PREVIOUS:   08-29-18: vit D 26.0 09-02-18: wbc 7.8; hgb 12.6; hct 35.7; mcv 87.9; plt 411; glucose 91; bun 13; creat 0.55; k+ 3.0; na++ 129; ca 6.4; liver normal albumin 3.2; urine culture: klebsiella pneumoniae  09-06-18: glucose 114; bun 18; creat 0.47; k+ 3.4;  na++ 132; ca 7.0  NO NEW LABS.     Review of Systems  Constitutional: Negative for malaise/fatigue.  Respiratory: Negative for cough and shortness of breath.   Cardiovascular: Negative for chest pain, palpitations and leg swelling.  Gastrointestinal: Positive for diarrhea. Negative for abdominal pain and heartburn.       Is chronic   Musculoskeletal: Negative for back pain, joint pain and myalgias.  Skin: Negative.   Neurological: Negative for dizziness.  Psychiatric/Behavioral: The patient is not nervous/anxious.      Physical Exam Constitutional:      General: She is not in acute distress.    Appearance: She is not diaphoretic.     Comments: Frail   Eyes:     Comments: Bilateral cataract removal with lens implants      Neck:     Musculoskeletal: Neck supple.     Thyroid: No thyromegaly.     Comments: Right carotid endarterectomy  Cardiovascular:     Rate and Rhythm: Normal rate and regular rhythm.     Pulses: Normal pulses.     Heart sounds: Normal heart sounds.  Pulmonary:     Effort: Pulmonary effort is normal. No respiratory distress.     Breath sounds: Normal breath sounds.  Abdominal:     General: Bowel sounds are normal. There is no distension.     Palpations: Abdomen is soft.     Tenderness: There is abdominal tenderness.     Comments: Has mild generalized tenderness   Musculoskeletal: Normal range of motion.     Right lower leg: No edema.     Left lower leg: No edema.     Comments: May 2020: pubic rami fracture  2014: cervical fusion 2017: right hip compression screw  Lumbar fusion Left shoulder replacement    Is able to move all extremities    Lymphadenopathy:     Cervical: No cervical adenopathy.  Skin:    General: Skin is warm and dry.     Comments: Bilateral lower extremities discolored   Neurological:     Mental Status: She is alert and oriented to person, place, and time.  Psychiatric:        Mood and Affect: Mood normal.      ASSESSMENT/  PLAN:   Patient is being discharged with the following home health services:  Pt/ot/rn/cna/sw: to evaluate and treat as indicated for gait balance strength adl training medication management; ald care and community outreach.   Patient is being discharged with the following durable medical equipment:  None needed   Patient has been advised to f/u with their PCP in 1-2 weeks to bring them up to date on their rehab stay.  Social services at facility was responsible for arranging this appointment.  Pt was provided with a 30 day supply of prescriptions for medications and refills must be obtained from their PCP.  For controlled substances, a more limited supply may be provided adequate until PCP appointment only.  A 30 day supply of her prescription medications with #30 lomotil tabs have been sent to [facility] in [place]  Time spent with patient: 35 minutes: medications home health needs and dme.  Ok Edwards NP [facility]  Contact [phone] Monday through Friday 8am- 5pm  After hours call [phone]

## 2018-09-10 NOTE — Progress Notes (Signed)
Pt's Calcium level is 7.5mg /dl. Lyla Glassing, RpH, notified and recommended that the pt reschedule after hypocalcemia is corrected for medication to work properly. Dr. Luan Pulling notified and concurred. Will reschedule at later date. [facility] called.

## 2018-10-02 DIAGNOSIS — J189 Pneumonia, unspecified organism: Secondary | ICD-10-CM | POA: Diagnosis not present

## 2018-10-26 ENCOUNTER — Encounter (HOSPITAL_COMMUNITY): Payer: Self-pay

## 2018-10-26 ENCOUNTER — Emergency Department (HOSPITAL_COMMUNITY)
Admission: EM | Admit: 2018-10-26 | Discharge: 2018-11-21 | Disposition: E | Attending: Emergency Medicine | Admitting: Emergency Medicine

## 2018-10-26 ENCOUNTER — Emergency Department (HOSPITAL_COMMUNITY)

## 2018-10-26 ENCOUNTER — Other Ambulatory Visit: Payer: Self-pay

## 2018-10-26 DIAGNOSIS — T68XXXA Hypothermia, initial encounter: Secondary | ICD-10-CM | POA: Diagnosis not present

## 2018-10-26 DIAGNOSIS — E162 Hypoglycemia, unspecified: Secondary | ICD-10-CM | POA: Insufficient documentation

## 2018-10-26 DIAGNOSIS — Z20828 Contact with and (suspected) exposure to other viral communicable diseases: Secondary | ICD-10-CM | POA: Insufficient documentation

## 2018-10-26 DIAGNOSIS — X31XXXA Exposure to excessive natural cold, initial encounter: Secondary | ICD-10-CM | POA: Diagnosis not present

## 2018-10-26 DIAGNOSIS — I10 Essential (primary) hypertension: Secondary | ICD-10-CM | POA: Insufficient documentation

## 2018-10-26 DIAGNOSIS — N390 Urinary tract infection, site not specified: Secondary | ICD-10-CM | POA: Insufficient documentation

## 2018-10-26 DIAGNOSIS — Z96612 Presence of left artificial shoulder joint: Secondary | ICD-10-CM | POA: Insufficient documentation

## 2018-10-26 DIAGNOSIS — R4020 Unspecified coma: Secondary | ICD-10-CM | POA: Diagnosis not present

## 2018-10-26 DIAGNOSIS — R4182 Altered mental status, unspecified: Secondary | ICD-10-CM | POA: Insufficient documentation

## 2018-10-26 DIAGNOSIS — J449 Chronic obstructive pulmonary disease, unspecified: Secondary | ICD-10-CM | POA: Diagnosis not present

## 2018-10-26 DIAGNOSIS — R404 Transient alteration of awareness: Secondary | ICD-10-CM | POA: Diagnosis not present

## 2018-10-26 DIAGNOSIS — Z96653 Presence of artificial knee joint, bilateral: Secondary | ICD-10-CM | POA: Diagnosis not present

## 2018-10-26 DIAGNOSIS — Z87891 Personal history of nicotine dependence: Secondary | ICD-10-CM | POA: Insufficient documentation

## 2018-10-26 DIAGNOSIS — R0902 Hypoxemia: Secondary | ICD-10-CM | POA: Diagnosis not present

## 2018-10-26 DIAGNOSIS — I491 Atrial premature depolarization: Secondary | ICD-10-CM | POA: Diagnosis not present

## 2018-10-26 DIAGNOSIS — I469 Cardiac arrest, cause unspecified: Secondary | ICD-10-CM | POA: Diagnosis not present

## 2018-10-26 DIAGNOSIS — I259 Chronic ischemic heart disease, unspecified: Secondary | ICD-10-CM | POA: Insufficient documentation

## 2018-10-26 DIAGNOSIS — R0989 Other specified symptoms and signs involving the circulatory and respiratory systems: Secondary | ICD-10-CM | POA: Diagnosis not present

## 2018-10-26 DIAGNOSIS — E039 Hypothyroidism, unspecified: Secondary | ICD-10-CM | POA: Insufficient documentation

## 2018-10-26 DIAGNOSIS — R778 Other specified abnormalities of plasma proteins: Secondary | ICD-10-CM

## 2018-10-26 DIAGNOSIS — R9431 Abnormal electrocardiogram [ECG] [EKG]: Secondary | ICD-10-CM

## 2018-10-26 DIAGNOSIS — Z7982 Long term (current) use of aspirin: Secondary | ICD-10-CM | POA: Diagnosis not present

## 2018-10-26 DIAGNOSIS — W19XXXA Unspecified fall, initial encounter: Secondary | ICD-10-CM

## 2018-10-26 LAB — URINALYSIS, ROUTINE W REFLEX MICROSCOPIC
Bilirubin Urine: NEGATIVE
Glucose, UA: NEGATIVE mg/dL
Hgb urine dipstick: NEGATIVE
Ketones, ur: NEGATIVE mg/dL
Nitrite: NEGATIVE
Protein, ur: 100 mg/dL — AB
Specific Gravity, Urine: 1.012 (ref 1.005–1.030)
WBC, UA: >50 WBC/hpf — ABNORMAL HIGH (ref 0–5)
pH: 7 (ref 5.0–8.0)

## 2018-10-26 LAB — CBG MONITORING, ED
Glucose-Capillary: 96 mg/dL (ref 70–99)
Glucose-Capillary: 307 mg/dL — ABNORMAL HIGH (ref 70–99)
Glucose-Capillary: 45 mg/dL — ABNORMAL LOW (ref 70–99)

## 2018-10-26 LAB — COMPREHENSIVE METABOLIC PANEL
ALT: 54 U/L — ABNORMAL HIGH (ref 0–44)
AST: 119 U/L — ABNORMAL HIGH (ref 15–41)
Albumin: 3.7 g/dL (ref 3.5–5.0)
Alkaline Phosphatase: 101 U/L (ref 38–126)
Anion gap: >20 — ABNORMAL HIGH (ref 5–15)
BUN: 32 mg/dL — ABNORMAL HIGH (ref 8–23)
CO2: 18 mmol/L — ABNORMAL LOW (ref 22–32)
Calcium: 8.6 mg/dL — ABNORMAL LOW (ref 8.9–10.3)
Chloride: 86 mmol/L — ABNORMAL LOW (ref 98–111)
Creatinine, Ser: 2.04 mg/dL — ABNORMAL HIGH (ref 0.44–1.00)
GFR calc Af Amer: 26 mL/min — ABNORMAL LOW (ref >60)
GFR calc non Af Amer: 22 mL/min — ABNORMAL LOW (ref >60)
Glucose, Bld: 38 mg/dL — CL (ref 70–99)
Potassium: 3.9 mmol/L (ref 3.5–5.1)
Sodium: 134 mmol/L — ABNORMAL LOW (ref 135–145)
Total Bilirubin: 1.1 mg/dL (ref 0.3–1.2)
Total Protein: 7.3 g/dL (ref 6.5–8.1)

## 2018-10-26 LAB — CBC WITH DIFFERENTIAL/PLATELET
Abs Immature Granulocytes: 0.22 10*3/uL — ABNORMAL HIGH (ref 0.00–0.07)
Basophils Absolute: 0.1 10*3/uL (ref 0.0–0.1)
Basophils Relative: 0 %
Eosinophils Absolute: 0 10*3/uL (ref 0.0–0.5)
Eosinophils Relative: 0 %
HCT: 49.8 % — ABNORMAL HIGH (ref 36.0–46.0)
Hemoglobin: 15.8 g/dL — ABNORMAL HIGH (ref 12.0–15.0)
Immature Granulocytes: 1 %
Lymphocytes Relative: 5 %
Lymphs Abs: 1.1 10*3/uL (ref 0.7–4.0)
MCH: 30.1 pg (ref 26.0–34.0)
MCHC: 31.7 g/dL (ref 30.0–36.0)
MCV: 94.9 fL (ref 80.0–100.0)
Monocytes Absolute: 1.2 10*3/uL — ABNORMAL HIGH (ref 0.1–1.0)
Monocytes Relative: 6 %
Neutro Abs: 17.6 10*3/uL — ABNORMAL HIGH (ref 1.7–7.7)
Neutrophils Relative %: 88 %
Platelets: 480 10*3/uL — ABNORMAL HIGH (ref 150–400)
RBC: 5.25 MIL/uL — ABNORMAL HIGH (ref 3.87–5.11)
RDW: 13.5 % (ref 11.5–15.5)
WBC: 20.2 10*3/uL — ABNORMAL HIGH (ref 4.0–10.5)
nRBC: 0 % (ref 0.0–0.2)

## 2018-10-26 LAB — BLOOD GAS, VENOUS
Acid-base deficit: 11.5 mmol/L — ABNORMAL HIGH (ref 0.0–2.0)
Bicarbonate: 12.2 mmol/L — ABNORMAL LOW (ref 20.0–28.0)
FIO2: 60
O2 Saturation: 28.7 %
Patient temperature: 33.8
pCO2, Ven: 66.8 mmHg — ABNORMAL HIGH (ref 44.0–60.0)
pH, Ven: 7.045 — CL (ref 7.250–7.430)
pO2, Ven: 31.1 mmHg — CL (ref 32.0–45.0)

## 2018-10-26 LAB — SARS CORONAVIRUS 2 BY RT PCR (HOSPITAL ORDER, PERFORMED IN ~~LOC~~ HOSPITAL LAB): SARS Coronavirus 2: NEGATIVE

## 2018-10-26 LAB — TROPONIN I (HIGH SENSITIVITY): Troponin I (High Sensitivity): 110 ng/L (ref <18)

## 2018-10-26 MED ORDER — DEXTROSE 50 % IV SOLN
1.0000 | Freq: Once | INTRAVENOUS | Status: AC
  Administered 2018-10-26: 50 mL via INTRAVENOUS

## 2018-10-26 MED ORDER — DEXTROSE 50 % IV SOLN
INTRAVENOUS | Status: AC
  Filled 2018-10-26: qty 50

## 2018-10-26 MED ORDER — SODIUM CHLORIDE 0.9 % IV SOLN
1.0000 g | Freq: Once | INTRAVENOUS | Status: DC
  Filled 2018-10-26: qty 10

## 2018-10-26 MED ORDER — SODIUM CHLORIDE 0.9 % IV SOLN
INTRAVENOUS | Status: DC
  Administered 2018-10-26: 11:00:00 via INTRAVENOUS

## 2018-10-30 LAB — URINE CULTURE: Culture: >=100000 — AB

## 2018-11-02 DIAGNOSIS — J189 Pneumonia, unspecified organism: Secondary | ICD-10-CM | POA: Diagnosis not present

## 2018-11-21 NOTE — ED Provider Notes (Signed)
[facility] EMERGENCY DEPARTMENT Provider Note   CSN: [id number] Arrival date & time: 2018/11/14  [id number]     History   Chief Complaint Chief Complaint  Patient presents with  . Fall  . Altered Mental Status    HPI [name] is a 83 y.o. female.     HPI   She presents for evaluation of altered mental status, and being found on floor.  EMS states that on their arrival she was unresponsive, and felt very cold.  They were concerned about impending cardiorespiratory arrest.  At one point she had low oxygen saturation.  Following warming measures, primarily blankets, the patient became more responsive, but not communicative.  Her vital signs also improved.  The patient is unable to give any history.  Level 5 caveat-altered mental status  Past Medical History:  Diagnosis Date  . Anxiety   . Arthritis   . Carotid artery disease ([place]) 2007   R CEA; dopplers 10/2016:  R ICA moderate (non-obstructive plaque) ; L ICA ~50-69%, but may underestimate. (reassess next with either  CTA versus formal cerebral angiogram)   . Constipation due to pain medication   . COPD (chronic obstructive pulmonary disease) ([place])   . Depression   . Diverticulosis   . Dyspnea   . Fibromyalgia   . Gastritis   . GERD (gastroesophageal reflux disease)   . Head injury   . History of pneumonia   . History of stroke [id number]   complication of R SubClav A PTA -- R hemispheric CVA  . Hypertension   . Hypothyroidism   . Osteoarthritis   . Peripheral vascular disease ([place])    Bilateral subclavian artery disease (status post R SubClav A PTA).  R side CEA (with US findings of Mod-Severe L CIA stenosis).  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   . Pneumonia   . Stroke ([place])   . Urgency incontinence     Patient Active Problem List   Diagnosis Date Noted  . Diarrhea 09/06/2018  . Chronic insomnia 09/05/2018  . Malnutrition of moderate degree 08/28/2018  . Hypoxia 08/26/2018  . Hypokalemia  08/26/2018  . Sepsis due to undetermined organism ([place]) 06/26/2018  . Acute respiratory failure with hypoxia ([place]) 06/26/2018  . Lobar pneumonia ([place]) 06/26/2018  . Closed fracture of multiple pubic rami, right, with delayed healing, subsequent encounter 06/26/2018  . Closed fracture of ramus of right pubis ([place])   . Acute colitis 12/02/2016  . Uncontrolled hypertension 12/02/2016  . Enteritis 12/02/2016  . Hyponatremia 12/02/2016  . Status post total shoulder arthroplasty, left 11/21/2016  . COPD (chronic obstructive pulmonary disease) ([place]) 11/21/2016  . Weakness 11/19/2016  . DJD of left shoulder 11/15/2016  . DOE (dyspnea on exertion) 11/11/2016  . Rotator cuff arthropathy of left shoulder 10/30/2016  . Intertrochanteric fracture of right hip ([place]) 08/01/2015    Class: Acute  . Chronic generalized abdominal pain 09/16/2014  . SMA stenosis ([place]) 09/15/2014  . Abdominal pain, right upper quadrant 09/15/2014  . Essential hypertension 09/15/2014  . Depression with suicidal ideation 09/15/2014  . Hypothyroid 09/15/2014  . GERD without esophagitis 09/15/2014  . Lumbago 11/12/2013  . Stiffness of joint, not elsewhere classified, pelvic region and thigh 11/12/2013  . Muscle weakness (generalized) 11/12/2013  . Lack of coordination 03/21/2013  . Fine motor impairment 03/21/2013  . Left leg weakness 03/17/2013  . Risk for falls 03/17/2013  . Carotid artery disease ([employer]) 03/11/2013  . Subclavian artery stenosis ([place]) 03/11/2013  .  TIA (transient ischemic attack) 03/11/2013  . Tobacco use disorder 03/11/2013  . Cerebral infarction ([place]) 03/05/2013  . Pain in joint, shoulder region 11/01/2010    Past Surgical History:  Procedure Laterality Date  . ANTERIOR AND POSTERIOR REPAIR  12/2000   Archie Endo 07/04/2010  . ANTERIOR CERVICAL DECOMP/DISCECTOMY FUSION N/A 04/03/2012   Procedure: ANTERIOR CERVICAL DECOMPRESSION/DISCECTOMY FUSION 2 LEVELS;  Surgeon: Hosie Spangle, MD;  Location: [facility]  NEURO ORS;  Service: Neurosurgery;  Laterality: N/A;  Cervical four-five,Cervical five-six anterior cervical decompression with fusion plating and bonegraft  . BACK SURGERY    . BREAST SURGERY Right 02/2007   Archie Endo 06/23/2010  . BUNIONECTOMY WITH HAMMERTOE RECONSTRUCTION Left 09/10/2007   Archie Endo 06/23/2010  . CAROTID ENDARTERECTOMY Right 2007   [facility], Dr. Lucky Cowboy  . CATARACT EXTRACTION W/ INTRAOCULAR LENS IMPLANT Left    Archie Endo 06/23/2010  . CHOLECYSTECTOMY    . COLONOSCOPY N/A 07/15/2014   Procedure: COLONOSCOPY;  Surgeon: Rogene Houston, MD;  Location: [facility] ENDO [facility];  Service: Endoscopy;  Laterality: N/A;  200 - moved to 2:10 - Ann to notify pt  . COMPRESSION HIP SCREW Right 08/01/2015   Procedure: COMPRESSION HIP;  Surgeon: Carole Civil, MD;  Location: [facility] ORS;  Service: Orthopedics;  Laterality: Right;  . ESOPHAGOGASTRODUODENOSCOPY N/A 07/15/2014   Procedure: ESOPHAGOGASTRODUODENOSCOPY (EGD);  Surgeon: Rogene Houston, MD;  Location: [facility] ENDO [facility];  Service: Endoscopy;  Laterality: N/A;  . EYE SURGERY     cataract /lens both eyes  . FRACTURE SURGERY    . INCONTINENCE SURGERY  12/2000   Tension-free transvaginal tape procedure/notes 07/04/2010  . IR ANGIOGRAM SELECTIVE EACH ADDITIONAL VESSEL  01/21/2018  . IR ANGIOGRAM VISCERAL SELECTIVE  01/21/2018  . IR PTA NON CORO-LOWER EXTREM  01/21/2018  . IR RADIOLOGIST EVAL & MGMT  12/11/2017  . IR TRANSCATH PLC STENT 1ST ART NOT LE CV CAR VERT CAR  01/21/2018  . IR US GUIDE VASC ACCESS RIGHT  01/21/2018  . JOINT REPLACEMENT    . LUMBAR FUSION  08/2006   Archie Endo 06/23/2010  . PERIPHERAL VASCULAR BALLOON ANGIOPLASTY  02/2013   Aortic arch angiography revealed 70% innominate/R subclavian stenosis along with 60% left common carotid ostial stenosis and moderate left subclavian artery stenosis. ->  Innominate artery PTA with 7 mm balloon reducing stenosis to roughly 30%.  Marland Kitchen PERIPHERAL VASCULAR CATHETERIZATION N/A 10/08/2014   Procedure: Visceral Angiography;   Surgeon: Algernon Huxley, MD;  Location: [facility];  Service: Cardiovascular;  - long stenosis/occlusion of SMA. 75% celiac artery (PTA with 6 mm balloon)  . PERIPHERAL VASCULAR CATHETERIZATION N/A 10/08/2014   Procedure: Visceral Artery Intervention;  Surgeon: Algernon Huxley, MD;  Location: [facility];  Service: Cardiovascular;: 6 mm PTA balloon to stenosed CELIAC ARTERY  . ROTATOR CUFF REPAIR Left X 3  . TOTAL KNEE ARTHROPLASTY Bilateral 2005; 2011   right; left  . TOTAL SHOULDER ARTHROPLASTY Left 11/15/2016  . TOTAL SHOULDER ARTHROPLASTY Left 11/15/2016   Procedure: LEFT TOTAL SHOULDER ARTHROPLASTY;  Surgeon: [name], MD;  Location: [facility];  Service: Orthopedics;  Laterality: Left;  . TRANSTHORACIC ECHOCARDIOGRAM  10/2016   Normal LV wall thickness with LVEF 60-65% and grade 1 diastolic dysfunction.  Aortic valve calcific sclerosis with no stenosis.  Mildly calcified mitral annulus and leaflets.   Marland Kitchen VAGINAL HYSTERECTOMY  12/2000   Archie Endo 07/04/2010     OB History   No obstetric history on file.      Home Medications  Prior to Admission medications   Medication Sig Start Date End Date Taking? Authorizing Provider  Ascorbic Acid (VITAMIN C) 1000 MG tablet Take 1,000 mg by mouth 2 (two) times daily.     [provider]  aspirin EC 81 MG tablet Take 81 mg by mouth daily.    [provider]  Calcium Carb-Cholecalciferol (CALCIUM 600 + D PO) Take 1 tablet by mouth 3 (three) times daily.  09/04/18   [provider]  diltiazem (DILACOR XR) 240 MG 24 hr capsule Take 1 capsule (240 mg total) by mouth daily. 09/10/18   Gerlene Fee, NP  diphenhydrAMINE (BENADRYL) 25 mg capsule Take 25 mg by mouth at bedtime. 09/05/18   [provider]  diphenoxylate-atropine (LOMOTIL) 2.5-0.025 MG tablet Take 1 tablet by mouth every 4 (four) hours as needed for diarrhea or loose stools. 09/10/18 09/10/19  Gerlene Fee, NP  DULoxetine (CYMBALTA) 30 MG  capsule Take 1 capsule (30 mg total) by mouth daily. 09/10/18   Gerlene Fee, NP  Flaxseed, Linseed, (FLAXSEED OIL) 1000 MG CAPS Take 1 capsule by mouth 2 (two) times daily.    [provider]  levalbuterol Penne Lash) 0.63 MG/3ML nebulizer solution Take 3 mLs (0.63 mg total) by nebulization every 4 (four) hours. 09/10/18   Gerlene Fee, NP  Multiple Vitamin (MULTIVITAMIN WITH MINERALS) TABS Take 1 tablet by mouth daily.    [provider]  niacin 500 MG CR capsule Take 500 mg by mouth at bedtime.    [provider]  NON FORMULARY Diet type:  Regular 09/05/18   [provider]  Omega-3 1000 MG CAPS Take 1 capsule by mouth daily.    [provider]  pantoprazole (PROTONIX) 40 MG tablet Take 1 tablet (40 mg total) by mouth daily. 09/10/18   Gerlene Fee, NP  potassium chloride SA (K-DUR) 20 MEQ tablet Take 1 tablet (20 mEq total) by mouth daily. 09/10/18   Gerlene Fee, NP    Family History Family History  Problem Relation Age of Onset  . Pancreatic cancer Mother   . Hypertension Mother   . Prostate cancer Father     Social History Social History   Tobacco Use  . Smoking status: Former Smoker    Packs/day: 0.12    Years: 63.00    Pack years: 7.56    Types: Cigarettes  . Smokeless tobacco: Never Used  Substance Use Topics  . Alcohol use: No    Alcohol/week: 0.0 standard drinks  . Drug use: No     Allergies   Aleve [naproxen sodium], Hydrocodone, Statins, Celebrex [celecoxib], Codeine, and Morphine and related   Review of Systems Review of Systems  Unable to perform ROS: Mental status change     Physical Exam Updated Vital Signs BP (!) 53/37   Pulse 87   Temp (!) 95.2 F (35.1 C)   Resp (!) 9   Wt 48.6 kg   SpO2 93%   BMI 19.92 kg/m   Physical Exam Vitals signs and nursing note reviewed.  Constitutional:      General: She is in acute distress.     Appearance: She is well-developed. She is ill-appearing and  toxic-appearing. She is not diaphoretic.     Comments: Frail, under nourished  HENT:     Head: Normocephalic and atraumatic.     Nose: No congestion or rhinorrhea.     Mouth/Throat:     Mouth: Mucous membranes are dry.     Comments:  Food particles in mouth.  No visible obstructing foreign body. Eyes:     Conjunctiva/sclera: Conjunctivae normal.     Pupils: Pupils are equal, round, and reactive to light.  Neck:     Musculoskeletal: Normal range of motion and neck supple.     Trachea: Phonation normal.  Cardiovascular:     Rate and Rhythm: Normal rate and regular rhythm.  Pulmonary:     Effort: Pulmonary effort is normal. No respiratory distress.     Breath sounds: Normal breath sounds. No stridor.  Chest:     Chest wall: No tenderness.  Abdominal:     General: There is no distension.     Palpations: Abdomen is soft. There is no mass.     Tenderness: There is no abdominal tenderness. There is no guarding.  Musculoskeletal:        General: No swelling or deformity.  Skin:    General: Skin is warm and dry.     Coloration: Skin is pale. Skin is not jaundiced.     Findings: No bruising.     Comments: Abrasion left elbow  Neurological:     Mental Status: She is alert.     Motor: No abnormal muscle tone.     Comments: No abnormality in muscle tone, appreciated.  Patient is able to respond, and follows limited commands.  No pupil asymmetry.  Psychiatric:     Comments: Obtunded      ED Treatments / Results  Labs (all labs ordered are listed, but only abnormal results are displayed) Labs Reviewed  COMPREHENSIVE METABOLIC PANEL - Abnormal; Notable for the following components:      Result Value   Sodium 134 (*)    Chloride 86 (*)    CO2 18 (*)    Glucose, Bld 38 (*)    BUN 32 (*)    Creatinine, Ser 2.04 (*)    Calcium 8.6 (*)    AST 119 (*)    ALT 54 (*)    GFR calc non Af Amer 22 (*)    GFR calc Af Amer 26 (*)    Anion gap >20 (*)    All other components within normal  limits  CBC WITH DIFFERENTIAL/PLATELET - Abnormal; Notable for the following components:   WBC 20.2 (*)    RBC 5.25 (*)    Hemoglobin 15.8 (*)    HCT 49.8 (*)    Platelets 480 (*)    Neutro Abs 17.6 (*)    Monocytes Absolute 1.2 (*)    Abs Immature Granulocytes 0.22 (*)    All other components within normal limits  URINALYSIS, ROUTINE W REFLEX MICROSCOPIC - Abnormal; Notable for the following components:   Color, Urine AMBER (*)    APPearance CLOUDY (*)    Protein, ur 100 (*)    Leukocytes,Ua SMALL (*)    WBC, UA >50 (*)    Bacteria, UA RARE (*)    All other components within normal limits  BLOOD GAS, VENOUS - Abnormal; Notable for the following components:   pH, Ven 7.045 (*)    pCO2, Ven 66.8 (*)    pO2, Ven 31.1 (*)    Bicarbonate 12.2 (*)    Acid-base deficit 11.5 (*)    All other components within normal limits  CBG MONITORING, ED - Abnormal; Notable for the following components:   Glucose-Capillary 45 (*)    All other components within normal limits  CBG MONITORING, ED - Abnormal; Notable for the following components:   Glucose-Capillary  307 (*)    All other components within normal limits  TROPONIN I (HIGH SENSITIVITY) - Abnormal; Notable for the following components:   Troponin I (High Sensitivity) 110 (*)    All other components within normal limits  SARS CORONAVIRUS 2 (HOSPITAL ORDER, [facility])  URINE CULTURE  CBG MONITORING, ED    EKG EKG Interpretation  Date/Time:  Nov 22, 2018 10:17:33 EDT Ventricular Rate:  86 PR Interval:    QRS Duration: 125 QT Interval:  437 QTC Calculation: 514 R Axis:   26 Text Interpretation:  Sinus rhythm Multiple ventricular premature complexes Consider right atrial enlargement Nonspecific intraventricular conduction delay Repol abnrm, severe global ischemia (LM/MVD) Since last tracing ST abnormality is new Confirmed by Daleen Bo [phone]) on 11/22/2018 11:16:54 AM   Radiology Dg  Chest Port 1 View  Result Date: 22-Nov-2018 CLINICAL DATA:  Altered mental status. EXAM: PORTABLE CHEST 1 VIEW COMPARISON:  08/26/2018 FINDINGS: Normal heart size. No pleural effusion. Pulmonary vascular congestion without overt edema. No pleural effusion or airspace consolidation. Previous left shoulder arthroplasty. IMPRESSION: 1. Pulmonary vascular congestion. Electronically Signed   By: Kerby Moors M.D.   On: 11/22/18 11:48    Procedures .Critical Care Performed by: Daleen Bo, MD Authorized by: Daleen Bo, MD   Critical care provider statement:    Critical care time (minutes):  35   Critical care start time:  November 22, 2018 9:57 AM   Critical care end time:  11/22/18 2:17 PM   Critical care time was exclusive of:  Separately billable procedures and treating other patients   Critical care was necessary to treat or prevent imminent or life-threatening deterioration of the following conditions:  Cardiac failure and respiratory failure   Critical care was time spent personally by me on the following activities:  Blood draw for specimens, development of treatment plan with patient or surrogate, discussions with consultants, evaluation of patient's response to treatment, examination of patient, obtaining history from patient or surrogate, ordering and performing treatments and interventions, ordering and review of laboratory studies, pulse oximetry, re-evaluation of patient's condition, review of old charts and ordering and review of radiographic studies   (including critical care time)  Medications Ordered in ED Medications  0.9 %  sodium chloride infusion ( Intravenous New Bag/Given 11/22/18 1051)  cefTRIAXone (ROCEPHIN) 1 g in sodium chloride 0.9 % 100 mL IVPB (has no administration in time range)  dextrose 50 % solution 50 mL (50 mLs Intravenous Given 2018/11/22 1140)     Initial Impression / Assessment and Plan / ED Course  I have reviewed the triage vital signs and the nursing notes.   Pertinent labs & imaging results that were available during my care of the patient were reviewed by me and considered in my medical decision making (see chart for details).  Clinical Course as of Oct 25 1413  Sat Oct 26, 2018  1005 Patient's nurse talk to her son, who is in [place], [place].  He states that his mother is a DNR.   [EW]  [id number] Patient is noticeably less responsive than earlier, mouth agape, eyes open, pupils dilated bilaterally. D50 ordered   [EW]  1150 I contacted the patient's son by telephone, to inform him of the patient's decompensation and expected imminent death.  He understands and does not have any other concerns, directions or requests.   [EW]  1155 Following D50 administration, the patient is resting more comfortably, mouth and eyes are now closed, pupils have diminished to  about 3 mm bilaterally, as they were earlier.   [EW]  1302 Negative  SARS Coronavirus 2 [facility] order, Performed in [facility] hospital lab) Nasopharyngeal Nasopharyngeal Swab [EW]  1302 Normal except white count high, hemoglobin high, platelets high  CBC with Differential(!) [EW]  1302 Abnormal, high  CBG monitoring, ED(!) [EW]  1302 Abnormal, pH low P O2 level, PCO2 high  Blood gas, venous(!!) [EW]  1302 Normal except sodium low, chloride low, CO2 low, glucose low, BUN low, creatinine high, calcium low, AST high, ALT high, GFR low, anion gap high  Comprehensive metabolic panel(!!) [EW]  [id number] Abnormal, presence of protein, leukocytes, white cells, and rare bacteria.  Urine culture ordered  Urinalysis, Routine w reflex microscopic(!) [EW]  1303 Pulmonary vascular congestion, no infiltrate, image reviewed by me  DG Chest Port 1 View [EW]  [id number] Called to room for asystole.  Eyes open, mouth open, pupils dilated to 9 mm, bilaterally.  No pulse, apneic.  Death pronounced at 39.  Nurse informed that this would not be medical examiner case.   [EW]  [id number] I discussed the outcome of this  case with Dr. Velvet Bathe, her PCP.  He will sign the death certificate.   [EW]    Clinical Course User Index [EW] Daleen Bo, MD        Patient Vitals for the past 24 hrs:  BP Temp Temp src Pulse Resp SpO2 Weight  11/20/18 1330 (!) 53/37 (!) 95.2 F (35.1 C) - - (!) 9 - -  11/20/18 1300 (!) 92/51 (!) 94.8 F (34.9 C) - - (!) 26 - -  Nov 20, 2018 1230 (!) 85/72 (!) 94.5 F (34.7 C) - - (!) 29 - -  11-20-2018 1215 - (!) 94.3 F (34.6 C) - - (!) 36 - -  11/20/18 1200 (!) 129/50 (!) 93.9 F (34.4 C) - 87 (!) 33 93 % -  11/20/18 1130 (!) 66/38 (!) 93.6 F (34.2 C) - (!) 31 (!) 21 92 % -  11/20/2018 1110 - - - - - 94 % -  November 20, 2018 1100 90/60 (!) 93.7 F (34.3 C) - - - - -  20-Nov-2018 1045 - (!) 93.7 F (34.3 C) - - 20 - -  11/20/2018 1030 104/87 - - - (!) 21 - -  11-20-18 1015 - - - 80 (!) 33 90 % -  Nov 20, 2018 1010 - - - - - - 48.6 kg  11/20/2018 1009 (!) 133/53 (!) 93 F (33.9 C) Rectal 86 (!) 21 (!) 88 % -  2018-11-20 1007 - - - - - (!) 87 % -     Medical Decision Making: Patient presenting with altered mental status, and medical decompensation, known DNR status.  Patient decompensated in the ED, and had a terminal event, asystole.  Multiple abnormal findings including low temperature, low blood sugar, urinary tract infection, and acidosis.  I suspect that she may have had bowel ischemia, as initiating cause.  Patient expired in the emergency department.  CRITICAL CARE-yes Performed by: Daleen Bo  Nursing Notes Reviewed/ Care Coordinated Applicable Imaging Reviewed Interpretation of Laboratory Data incorporated into ED treatment  Patient transferred to morgue  Final Clinical Impressions(s) / ED Diagnoses   Final diagnoses:  Altered mental status, unspecified altered mental status type  Hypothermia, initial encounter  Hypoglycemia  Urinary tract infection without hematuria, site unspecified  Elevated troponin  Abnormal EKG  Fall, initial encounter  Cardiopulmonary arrest  [facility])    ED Discharge Orders    None  Daleen Bo, MD 11-15-2018 [phone]

## 2018-11-21 NOTE — ED Notes (Signed)
Pt unresponsive with agonal respirations. HR 49 EDP notified

## 2018-11-21 NOTE — ED Notes (Addendum)
Pt asystole at 1353. EDP notifed and in room Attempted to call 2 of her sons and went to voicemail

## 2018-11-21 NOTE — ED Notes (Signed)
Spoke with son and notified of death. He wishes to view the body

## 2018-11-21 NOTE — ED Notes (Signed)
Respiratory notifed

## 2018-11-21 NOTE — ED Triage Notes (Signed)
Pt found in floor after approx 5 hours in floor. Pt was responsive and cold to touch at the time, then went unresponsive for approx 20 mins  Sats 70's on scene. NRB at 10 L brought sats up to 100%. CBG 110 at scene. Initial BP 82/64, then up to 127/72. Pt is awake and slow to respond

## 2018-11-21 NOTE — ED Notes (Signed)
Bair Hugger applied. Sats decreasing to 72% with good pleath. O2 at 5 L via [place] . NRB placed at 10 will titrate as needed

## 2018-11-21 NOTE — ED Notes (Addendum)
Spoke with son Gwyndolyn Saxon and updated . Son is driving from [place].  Son reports mother wishes to be DNR and is on hospice.. Sons number [phone]

## 2018-11-21 NOTE — ED Notes (Signed)
Critical call from [place]:   VBG PH 7.045  PO2 31.1  Mandi, RN, CN notified as well as Dr Viona Gilmore

## 2018-11-21 NOTE — ED Notes (Signed)
Pt unresponsive to verbal and painful stimuli. Eyes fixed. Resp shallow and irregular. BP 66/38 EDP notified and in room CBG 45  Amp of D 50 adm

## 2018-11-21 NOTE — ED Notes (Signed)
CRITICAL VALUE ALERT  Critical Value: glucose 38 troponin 110  Date & Time Notied:  1215  Provider Notified: wentz  Orders Received/Actions taken: glucose addressed prior w amp D50

## 2018-11-21 DEATH — deceased
